# Patient Record
Sex: Female | Born: 1935 | Race: White | Hispanic: No | Smoking: Former smoker
Health system: Southern US, Community
[De-identification: ages and names within clinical notes are randomized; demographics above are authoritative.]

## PROBLEM LIST (undated history)

## (undated) DIAGNOSIS — J45909 Unspecified asthma, uncomplicated: Secondary | ICD-10-CM

## (undated) DIAGNOSIS — N390 Urinary tract infection, site not specified: Secondary | ICD-10-CM

## (undated) DIAGNOSIS — I5032 Chronic diastolic (congestive) heart failure: Secondary | ICD-10-CM

## (undated) DIAGNOSIS — Z8719 Personal history of other diseases of the digestive system: Secondary | ICD-10-CM

## (undated) DIAGNOSIS — I639 Cerebral infarction, unspecified: Secondary | ICD-10-CM

## (undated) DIAGNOSIS — M1990 Unspecified osteoarthritis, unspecified site: Secondary | ICD-10-CM

## (undated) DIAGNOSIS — J189 Pneumonia, unspecified organism: Secondary | ICD-10-CM

## (undated) DIAGNOSIS — N183 Chronic kidney disease, stage 3 unspecified: Secondary | ICD-10-CM

## (undated) DIAGNOSIS — I82409 Acute embolism and thrombosis of unspecified deep veins of unspecified lower extremity: Secondary | ICD-10-CM

## (undated) DIAGNOSIS — I4891 Unspecified atrial fibrillation: Secondary | ICD-10-CM

## (undated) DIAGNOSIS — J449 Chronic obstructive pulmonary disease, unspecified: Secondary | ICD-10-CM

## (undated) DIAGNOSIS — K439 Ventral hernia without obstruction or gangrene: Secondary | ICD-10-CM

## (undated) DIAGNOSIS — D649 Anemia, unspecified: Secondary | ICD-10-CM

## (undated) DIAGNOSIS — E119 Type 2 diabetes mellitus without complications: Secondary | ICD-10-CM

## (undated) DIAGNOSIS — R0602 Shortness of breath: Secondary | ICD-10-CM

## (undated) DIAGNOSIS — I350 Nonrheumatic aortic (valve) stenosis: Secondary | ICD-10-CM

## (undated) DIAGNOSIS — K56609 Unspecified intestinal obstruction, unspecified as to partial versus complete obstruction: Secondary | ICD-10-CM

## (undated) DIAGNOSIS — J9610 Chronic respiratory failure, unspecified whether with hypoxia or hypercapnia: Secondary | ICD-10-CM

## (undated) DIAGNOSIS — K219 Gastro-esophageal reflux disease without esophagitis: Secondary | ICD-10-CM

## (undated) DIAGNOSIS — R791 Abnormal coagulation profile: Secondary | ICD-10-CM

## (undated) DIAGNOSIS — I2510 Atherosclerotic heart disease of native coronary artery without angina pectoris: Secondary | ICD-10-CM

## (undated) DIAGNOSIS — Z9981 Dependence on supplemental oxygen: Secondary | ICD-10-CM

## (undated) DIAGNOSIS — I2699 Other pulmonary embolism without acute cor pulmonale: Secondary | ICD-10-CM

## (undated) DIAGNOSIS — H3530 Unspecified macular degeneration: Secondary | ICD-10-CM

## (undated) DIAGNOSIS — I10 Essential (primary) hypertension: Secondary | ICD-10-CM

## (undated) DIAGNOSIS — I219 Acute myocardial infarction, unspecified: Secondary | ICD-10-CM

## (undated) DIAGNOSIS — F329 Major depressive disorder, single episode, unspecified: Secondary | ICD-10-CM

## (undated) DIAGNOSIS — E890 Postprocedural hypothyroidism: Secondary | ICD-10-CM

## (undated) DIAGNOSIS — R569 Unspecified convulsions: Secondary | ICD-10-CM

## (undated) DIAGNOSIS — F32A Depression, unspecified: Secondary | ICD-10-CM

## (undated) DIAGNOSIS — Z9289 Personal history of other medical treatment: Secondary | ICD-10-CM

## (undated) DIAGNOSIS — E785 Hyperlipidemia, unspecified: Secondary | ICD-10-CM

## (undated) DIAGNOSIS — R079 Chest pain, unspecified: Secondary | ICD-10-CM

## (undated) DIAGNOSIS — Z8711 Personal history of peptic ulcer disease: Secondary | ICD-10-CM

## (undated) DIAGNOSIS — J42 Unspecified chronic bronchitis: Secondary | ICD-10-CM

## (undated) DIAGNOSIS — E0500 Thyrotoxicosis with diffuse goiter without thyrotoxic crisis or storm: Secondary | ICD-10-CM

## (undated) HISTORY — DX: Abnormal coagulation profile: R79.1

## (undated) HISTORY — DX: Chronic diastolic (congestive) heart failure: I50.32

## (undated) HISTORY — DX: Unspecified osteoarthritis, unspecified site: M19.90

## (undated) HISTORY — DX: Hyperlipidemia, unspecified: E78.5

## (undated) HISTORY — DX: Chronic respiratory failure, unspecified whether with hypoxia or hypercapnia: J96.10

## (undated) HISTORY — DX: Other pulmonary embolism without acute cor pulmonale: I26.99

## (undated) HISTORY — DX: Morbid (severe) obesity due to excess calories: E66.01

## (undated) HISTORY — PX: BREAST BIOPSY: SHX20

## (undated) HISTORY — DX: Gastro-esophageal reflux disease without esophagitis: K21.9

## (undated) HISTORY — PX: DILATION AND CURETTAGE OF UTERUS: SHX78

## (undated) HISTORY — DX: Depression, unspecified: F32.A

## (undated) HISTORY — DX: Unspecified atrial fibrillation: I48.91

## (undated) HISTORY — DX: Chronic kidney disease, stage 3 unspecified: N18.30

## (undated) HISTORY — DX: Major depressive disorder, single episode, unspecified: F32.9

## (undated) HISTORY — PX: APPENDECTOMY: SHX54

## (undated) HISTORY — DX: Acute embolism and thrombosis of unspecified deep veins of unspecified lower extremity: I82.409

## (undated) HISTORY — DX: Unspecified intestinal obstruction, unspecified as to partial versus complete obstruction: K56.609

## (undated) HISTORY — DX: Chronic kidney disease, stage 3 (moderate): N18.3

## (undated) HISTORY — DX: Ventral hernia without obstruction or gangrene: K43.9

## (undated) HISTORY — DX: Nonrheumatic aortic (valve) stenosis: I35.0

---

## 1968-10-26 HISTORY — PX: GASTRIC BYPASS: SHX52

## 1968-10-26 HISTORY — PX: CHOLECYSTECTOMY: SHX55

## 1978-10-27 DIAGNOSIS — I219 Acute myocardial infarction, unspecified: Secondary | ICD-10-CM

## 1978-10-27 HISTORY — DX: Acute myocardial infarction, unspecified: I21.9

## 1988-10-26 DIAGNOSIS — R569 Unspecified convulsions: Secondary | ICD-10-CM

## 1988-10-26 HISTORY — DX: Unspecified convulsions: R56.9

## 1995-02-26 DIAGNOSIS — I639 Cerebral infarction, unspecified: Secondary | ICD-10-CM

## 1995-02-26 HISTORY — DX: Cerebral infarction, unspecified: I63.9

## 1996-02-26 DIAGNOSIS — I2699 Other pulmonary embolism without acute cor pulmonale: Secondary | ICD-10-CM

## 1996-02-26 DIAGNOSIS — I82409 Acute embolism and thrombosis of unspecified deep veins of unspecified lower extremity: Secondary | ICD-10-CM

## 1996-02-26 HISTORY — PX: INSERTION OF VENA CAVA FILTER: SHX5871

## 1996-02-26 HISTORY — DX: Acute embolism and thrombosis of unspecified deep veins of unspecified lower extremity: I82.409

## 1996-02-26 HISTORY — DX: Other pulmonary embolism without acute cor pulmonale: I26.99

## 1997-05-02 ENCOUNTER — Ambulatory Visit (HOSPITAL_COMMUNITY): Admission: RE | Admit: 1997-05-02 | Discharge: 1997-05-02 | Payer: Self-pay | Admitting: Obstetrics

## 1997-05-05 ENCOUNTER — Ambulatory Visit (HOSPITAL_COMMUNITY): Admission: RE | Admit: 1997-05-05 | Discharge: 1997-05-05 | Payer: Self-pay | Admitting: Obstetrics

## 1997-06-16 ENCOUNTER — Encounter: Admission: RE | Admit: 1997-06-16 | Discharge: 1997-06-16 | Payer: Self-pay | Admitting: Obstetrics

## 1997-07-12 ENCOUNTER — Ambulatory Visit (HOSPITAL_COMMUNITY): Admission: RE | Admit: 1997-07-12 | Discharge: 1997-07-12 | Payer: Self-pay | Admitting: Cardiology

## 1997-07-28 ENCOUNTER — Ambulatory Visit (HOSPITAL_COMMUNITY): Admission: RE | Admit: 1997-07-28 | Discharge: 1997-07-28 | Payer: Self-pay | Admitting: Cardiology

## 1997-10-27 ENCOUNTER — Encounter: Payer: Self-pay | Admitting: Internal Medicine

## 1997-12-19 ENCOUNTER — Ambulatory Visit (HOSPITAL_COMMUNITY): Admission: RE | Admit: 1997-12-19 | Discharge: 1997-12-19 | Payer: Self-pay | Admitting: Family Medicine

## 1998-01-26 ENCOUNTER — Encounter: Admission: RE | Admit: 1998-01-26 | Discharge: 1998-02-20 | Payer: Self-pay | Admitting: Family Medicine

## 1998-04-20 ENCOUNTER — Encounter: Admission: RE | Admit: 1998-04-20 | Discharge: 1998-04-20 | Payer: Self-pay | Admitting: Obstetrics

## 1998-09-13 ENCOUNTER — Ambulatory Visit (HOSPITAL_COMMUNITY): Admission: RE | Admit: 1998-09-13 | Discharge: 1998-09-13 | Payer: Self-pay | Admitting: Family Medicine

## 1998-09-26 ENCOUNTER — Ambulatory Visit (HOSPITAL_COMMUNITY): Admission: RE | Admit: 1998-09-26 | Discharge: 1998-09-26 | Payer: Self-pay | Admitting: Cardiology

## 1998-09-27 ENCOUNTER — Encounter: Payer: Self-pay | Admitting: Cardiology

## 1998-11-22 ENCOUNTER — Ambulatory Visit (HOSPITAL_COMMUNITY): Admission: RE | Admit: 1998-11-22 | Discharge: 1998-11-22 | Payer: Self-pay | Admitting: Family Medicine

## 1998-11-22 ENCOUNTER — Encounter: Payer: Self-pay | Admitting: Family Medicine

## 1998-12-18 ENCOUNTER — Encounter: Payer: Self-pay | Admitting: Family Medicine

## 1998-12-18 ENCOUNTER — Ambulatory Visit (HOSPITAL_COMMUNITY): Admission: RE | Admit: 1998-12-18 | Discharge: 1998-12-18 | Payer: Self-pay | Admitting: Family Medicine

## 1999-01-03 ENCOUNTER — Encounter (HOSPITAL_BASED_OUTPATIENT_CLINIC_OR_DEPARTMENT_OTHER): Payer: Self-pay | Admitting: General Surgery

## 1999-01-03 ENCOUNTER — Ambulatory Visit (HOSPITAL_COMMUNITY): Admission: RE | Admit: 1999-01-03 | Discharge: 1999-01-03 | Payer: Self-pay | Admitting: *Deleted

## 1999-08-28 ENCOUNTER — Encounter: Admission: RE | Admit: 1999-08-28 | Discharge: 1999-08-28 | Payer: Self-pay | Admitting: Internal Medicine

## 1999-11-20 ENCOUNTER — Ambulatory Visit (HOSPITAL_COMMUNITY): Admission: RE | Admit: 1999-11-20 | Discharge: 1999-11-20 | Payer: Self-pay | Admitting: Internal Medicine

## 1999-11-20 ENCOUNTER — Encounter: Payer: Self-pay | Admitting: Internal Medicine

## 1999-11-20 ENCOUNTER — Encounter: Admission: RE | Admit: 1999-11-20 | Discharge: 1999-11-20 | Payer: Self-pay | Admitting: Internal Medicine

## 1999-11-22 ENCOUNTER — Ambulatory Visit (HOSPITAL_COMMUNITY): Admission: RE | Admit: 1999-11-22 | Discharge: 1999-11-22 | Payer: Self-pay | Admitting: Internal Medicine

## 1999-12-18 ENCOUNTER — Encounter: Admission: RE | Admit: 1999-12-18 | Discharge: 1999-12-18 | Payer: Self-pay | Admitting: Internal Medicine

## 2000-01-11 ENCOUNTER — Encounter: Payer: Self-pay | Admitting: Pulmonary Disease

## 2000-01-11 ENCOUNTER — Ambulatory Visit (HOSPITAL_COMMUNITY): Admission: RE | Admit: 2000-01-11 | Discharge: 2000-01-11 | Payer: Self-pay | Admitting: Pulmonary Disease

## 2000-02-06 ENCOUNTER — Ambulatory Visit (HOSPITAL_COMMUNITY): Admission: RE | Admit: 2000-02-06 | Discharge: 2000-02-06 | Payer: Self-pay | Admitting: Internal Medicine

## 2000-02-12 ENCOUNTER — Encounter: Admission: RE | Admit: 2000-02-12 | Discharge: 2000-02-12 | Payer: Self-pay | Admitting: Internal Medicine

## 2000-04-15 ENCOUNTER — Ambulatory Visit (HOSPITAL_COMMUNITY): Admission: RE | Admit: 2000-04-15 | Discharge: 2000-04-15 | Payer: Self-pay

## 2000-04-15 ENCOUNTER — Encounter: Admission: RE | Admit: 2000-04-15 | Discharge: 2000-04-15 | Payer: Self-pay | Admitting: Internal Medicine

## 2000-06-03 ENCOUNTER — Encounter: Admission: RE | Admit: 2000-06-03 | Discharge: 2000-06-03 | Payer: Self-pay | Admitting: Internal Medicine

## 2000-06-13 ENCOUNTER — Encounter: Payer: Self-pay | Admitting: Emergency Medicine

## 2000-06-13 ENCOUNTER — Inpatient Hospital Stay (HOSPITAL_COMMUNITY): Admission: EM | Admit: 2000-06-13 | Discharge: 2000-06-17 | Payer: Self-pay | Admitting: Emergency Medicine

## 2000-07-15 ENCOUNTER — Encounter: Admission: RE | Admit: 2000-07-15 | Discharge: 2000-07-15 | Payer: Self-pay | Admitting: Internal Medicine

## 2000-08-12 ENCOUNTER — Encounter: Admission: RE | Admit: 2000-08-12 | Discharge: 2000-08-12 | Payer: Self-pay | Admitting: Internal Medicine

## 2000-09-02 ENCOUNTER — Ambulatory Visit (HOSPITAL_COMMUNITY): Admission: RE | Admit: 2000-09-02 | Discharge: 2000-09-02 | Payer: Self-pay | Admitting: *Deleted

## 2000-09-23 ENCOUNTER — Encounter: Admission: RE | Admit: 2000-09-23 | Discharge: 2000-09-23 | Payer: Self-pay | Admitting: Internal Medicine

## 2000-11-28 ENCOUNTER — Encounter: Admission: RE | Admit: 2000-11-28 | Discharge: 2001-02-26 | Payer: Self-pay | Admitting: Internal Medicine

## 2000-12-22 ENCOUNTER — Encounter: Admission: RE | Admit: 2000-12-22 | Discharge: 2000-12-22 | Payer: Self-pay | Admitting: Internal Medicine

## 2000-12-23 ENCOUNTER — Encounter: Admission: RE | Admit: 2000-12-23 | Discharge: 2000-12-23 | Payer: Self-pay | Admitting: Internal Medicine

## 2001-01-08 ENCOUNTER — Ambulatory Visit (HOSPITAL_COMMUNITY): Admission: RE | Admit: 2001-01-08 | Discharge: 2001-01-08 | Payer: Self-pay | Admitting: Internal Medicine

## 2001-01-08 ENCOUNTER — Encounter: Admission: RE | Admit: 2001-01-08 | Discharge: 2001-01-08 | Payer: Self-pay | Admitting: Internal Medicine

## 2001-01-12 ENCOUNTER — Encounter: Admission: RE | Admit: 2001-01-12 | Discharge: 2001-01-12 | Payer: Self-pay | Admitting: Internal Medicine

## 2001-01-26 ENCOUNTER — Encounter: Payer: Self-pay | Admitting: Internal Medicine

## 2001-01-26 ENCOUNTER — Encounter: Admission: RE | Admit: 2001-01-26 | Discharge: 2001-01-26 | Payer: Self-pay | Admitting: Internal Medicine

## 2001-01-26 ENCOUNTER — Ambulatory Visit (HOSPITAL_COMMUNITY): Admission: RE | Admit: 2001-01-26 | Discharge: 2001-01-26 | Payer: Self-pay | Admitting: Internal Medicine

## 2001-03-02 ENCOUNTER — Encounter: Admission: RE | Admit: 2001-03-02 | Discharge: 2001-03-02 | Payer: Self-pay | Admitting: Internal Medicine

## 2001-03-24 ENCOUNTER — Encounter: Admission: RE | Admit: 2001-03-24 | Discharge: 2001-03-24 | Payer: Self-pay | Admitting: Internal Medicine

## 2001-03-27 ENCOUNTER — Inpatient Hospital Stay (HOSPITAL_COMMUNITY): Admission: EM | Admit: 2001-03-27 | Discharge: 2001-03-31 | Payer: Self-pay | Admitting: Emergency Medicine

## 2001-03-28 ENCOUNTER — Encounter: Payer: Self-pay | Admitting: Internal Medicine

## 2001-04-08 ENCOUNTER — Ambulatory Visit (HOSPITAL_COMMUNITY): Admission: RE | Admit: 2001-04-08 | Discharge: 2001-04-08 | Payer: Self-pay

## 2001-04-08 ENCOUNTER — Encounter: Admission: RE | Admit: 2001-04-08 | Discharge: 2001-04-08 | Payer: Self-pay

## 2001-04-09 ENCOUNTER — Inpatient Hospital Stay (HOSPITAL_COMMUNITY): Admission: AD | Admit: 2001-04-09 | Discharge: 2001-04-17 | Payer: Self-pay | Admitting: Internal Medicine

## 2001-04-10 ENCOUNTER — Encounter: Payer: Self-pay | Admitting: Internal Medicine

## 2001-04-11 ENCOUNTER — Encounter: Payer: Self-pay | Admitting: *Deleted

## 2001-04-13 ENCOUNTER — Encounter: Payer: Self-pay | Admitting: Internal Medicine

## 2001-04-27 ENCOUNTER — Encounter: Admission: RE | Admit: 2001-04-27 | Discharge: 2001-04-27 | Payer: Self-pay | Admitting: Internal Medicine

## 2001-05-27 ENCOUNTER — Encounter (INDEPENDENT_AMBULATORY_CARE_PROVIDER_SITE_OTHER): Payer: Self-pay | Admitting: *Deleted

## 2001-05-27 ENCOUNTER — Ambulatory Visit (HOSPITAL_COMMUNITY): Admission: RE | Admit: 2001-05-27 | Discharge: 2001-05-27 | Payer: Self-pay | Admitting: *Deleted

## 2001-05-27 LAB — HM COLONOSCOPY

## 2001-06-29 ENCOUNTER — Ambulatory Visit (HOSPITAL_COMMUNITY): Admission: RE | Admit: 2001-06-29 | Discharge: 2001-06-29 | Payer: Self-pay | Admitting: Cardiology

## 2001-06-29 ENCOUNTER — Encounter: Payer: Self-pay | Admitting: Cardiology

## 2001-06-30 ENCOUNTER — Encounter: Admission: RE | Admit: 2001-06-30 | Discharge: 2001-06-30 | Payer: Self-pay | Admitting: Internal Medicine

## 2001-07-01 ENCOUNTER — Encounter: Admission: RE | Admit: 2001-07-01 | Discharge: 2001-07-01 | Payer: Self-pay | Admitting: Internal Medicine

## 2001-07-08 ENCOUNTER — Encounter: Admission: RE | Admit: 2001-07-08 | Discharge: 2001-07-08 | Payer: Self-pay | Admitting: Internal Medicine

## 2001-07-09 ENCOUNTER — Encounter: Admission: RE | Admit: 2001-07-09 | Discharge: 2001-10-07 | Payer: Self-pay | Admitting: Internal Medicine

## 2001-07-16 ENCOUNTER — Emergency Department (HOSPITAL_COMMUNITY): Admission: EM | Admit: 2001-07-16 | Discharge: 2001-07-16 | Payer: Self-pay | Admitting: Emergency Medicine

## 2001-08-05 ENCOUNTER — Encounter: Admission: RE | Admit: 2001-08-05 | Discharge: 2001-08-05 | Payer: Self-pay | Admitting: Internal Medicine

## 2001-08-17 ENCOUNTER — Encounter: Admission: RE | Admit: 2001-08-17 | Discharge: 2001-08-17 | Payer: Self-pay | Admitting: Internal Medicine

## 2001-08-24 ENCOUNTER — Encounter: Payer: Self-pay | Admitting: Internal Medicine

## 2001-08-24 ENCOUNTER — Ambulatory Visit (HOSPITAL_COMMUNITY): Admission: RE | Admit: 2001-08-24 | Discharge: 2001-08-24 | Payer: Self-pay | Admitting: Internal Medicine

## 2001-10-27 ENCOUNTER — Encounter (HOSPITAL_BASED_OUTPATIENT_CLINIC_OR_DEPARTMENT_OTHER): Admission: RE | Admit: 2001-10-27 | Discharge: 2002-01-19 | Payer: Self-pay | Admitting: Internal Medicine

## 2001-11-16 ENCOUNTER — Encounter: Admission: RE | Admit: 2001-11-16 | Discharge: 2001-11-16 | Payer: Self-pay | Admitting: Internal Medicine

## 2001-11-17 ENCOUNTER — Encounter: Admission: RE | Admit: 2001-11-17 | Discharge: 2001-11-17 | Payer: Self-pay | Admitting: Internal Medicine

## 2001-12-02 ENCOUNTER — Encounter (HOSPITAL_BASED_OUTPATIENT_CLINIC_OR_DEPARTMENT_OTHER): Admission: RE | Admit: 2001-12-02 | Discharge: 2002-03-02 | Payer: Self-pay | Admitting: Internal Medicine

## 2002-01-28 ENCOUNTER — Inpatient Hospital Stay (HOSPITAL_COMMUNITY): Admission: EM | Admit: 2002-01-28 | Discharge: 2002-02-02 | Payer: Self-pay | Admitting: *Deleted

## 2002-01-28 ENCOUNTER — Encounter: Payer: Self-pay | Admitting: Cardiology

## 2002-01-29 ENCOUNTER — Encounter (INDEPENDENT_AMBULATORY_CARE_PROVIDER_SITE_OTHER): Payer: Self-pay | Admitting: Cardiology

## 2002-02-09 ENCOUNTER — Encounter: Admission: RE | Admit: 2002-02-09 | Discharge: 2002-02-09 | Payer: Self-pay | Admitting: Internal Medicine

## 2002-02-12 ENCOUNTER — Encounter: Admission: RE | Admit: 2002-02-12 | Discharge: 2002-02-12 | Payer: Self-pay | Admitting: Internal Medicine

## 2002-02-15 ENCOUNTER — Encounter: Admission: RE | Admit: 2002-02-15 | Discharge: 2002-02-15 | Payer: Self-pay | Admitting: Internal Medicine

## 2002-02-24 ENCOUNTER — Ambulatory Visit (HOSPITAL_COMMUNITY): Admission: RE | Admit: 2002-02-24 | Discharge: 2002-02-24 | Payer: Self-pay | Admitting: Cardiology

## 2002-03-12 ENCOUNTER — Encounter (HOSPITAL_BASED_OUTPATIENT_CLINIC_OR_DEPARTMENT_OTHER): Admission: RE | Admit: 2002-03-12 | Discharge: 2002-06-10 | Payer: Self-pay | Admitting: Internal Medicine

## 2002-03-29 ENCOUNTER — Encounter: Admission: RE | Admit: 2002-03-29 | Discharge: 2002-03-29 | Payer: Self-pay | Admitting: Internal Medicine

## 2002-04-01 ENCOUNTER — Encounter: Admission: RE | Admit: 2002-04-01 | Discharge: 2002-04-01 | Payer: Self-pay | Admitting: Internal Medicine

## 2002-04-19 ENCOUNTER — Encounter: Admission: RE | Admit: 2002-04-19 | Discharge: 2002-04-19 | Payer: Self-pay | Admitting: Internal Medicine

## 2002-05-24 ENCOUNTER — Encounter: Admission: RE | Admit: 2002-05-24 | Discharge: 2002-05-24 | Payer: Self-pay | Admitting: Internal Medicine

## 2002-06-02 ENCOUNTER — Encounter: Admission: RE | Admit: 2002-06-02 | Discharge: 2002-06-02 | Payer: Self-pay | Admitting: Internal Medicine

## 2002-06-15 ENCOUNTER — Encounter (HOSPITAL_BASED_OUTPATIENT_CLINIC_OR_DEPARTMENT_OTHER): Admission: RE | Admit: 2002-06-15 | Discharge: 2002-09-13 | Payer: Self-pay | Admitting: Internal Medicine

## 2002-06-28 ENCOUNTER — Encounter: Admission: RE | Admit: 2002-06-28 | Discharge: 2002-06-28 | Payer: Self-pay | Admitting: Internal Medicine

## 2002-07-05 ENCOUNTER — Encounter: Admission: RE | Admit: 2002-07-05 | Discharge: 2002-07-05 | Payer: Self-pay | Admitting: Internal Medicine

## 2002-08-02 ENCOUNTER — Encounter: Admission: RE | Admit: 2002-08-02 | Discharge: 2002-08-02 | Payer: Self-pay | Admitting: Internal Medicine

## 2002-08-23 ENCOUNTER — Encounter: Admission: RE | Admit: 2002-08-23 | Discharge: 2002-08-23 | Payer: Self-pay | Admitting: Internal Medicine

## 2002-09-15 ENCOUNTER — Encounter (HOSPITAL_BASED_OUTPATIENT_CLINIC_OR_DEPARTMENT_OTHER): Admission: RE | Admit: 2002-09-15 | Discharge: 2002-09-22 | Payer: Self-pay | Admitting: Internal Medicine

## 2002-09-20 ENCOUNTER — Ambulatory Visit (HOSPITAL_COMMUNITY): Admission: RE | Admit: 2002-09-20 | Discharge: 2002-09-20 | Payer: Self-pay | Admitting: Internal Medicine

## 2002-09-20 ENCOUNTER — Encounter: Admission: RE | Admit: 2002-09-20 | Discharge: 2002-09-20 | Payer: Self-pay | Admitting: Internal Medicine

## 2002-10-11 ENCOUNTER — Encounter: Admission: RE | Admit: 2002-10-11 | Discharge: 2002-10-11 | Payer: Self-pay | Admitting: Internal Medicine

## 2002-11-08 ENCOUNTER — Encounter: Admission: RE | Admit: 2002-11-08 | Discharge: 2002-11-08 | Payer: Self-pay | Admitting: Infectious Diseases

## 2002-11-17 ENCOUNTER — Encounter: Admission: RE | Admit: 2002-11-17 | Discharge: 2002-11-17 | Payer: Self-pay | Admitting: Internal Medicine

## 2002-12-06 ENCOUNTER — Encounter: Admission: RE | Admit: 2002-12-06 | Discharge: 2002-12-06 | Payer: Self-pay | Admitting: Internal Medicine

## 2002-12-15 ENCOUNTER — Encounter: Admission: RE | Admit: 2002-12-15 | Discharge: 2002-12-15 | Payer: Self-pay | Admitting: Internal Medicine

## 2002-12-27 ENCOUNTER — Encounter: Admission: RE | Admit: 2002-12-27 | Discharge: 2002-12-27 | Payer: Self-pay | Admitting: Internal Medicine

## 2002-12-30 ENCOUNTER — Encounter (HOSPITAL_BASED_OUTPATIENT_CLINIC_OR_DEPARTMENT_OTHER): Admission: RE | Admit: 2002-12-30 | Discharge: 2003-02-09 | Payer: Self-pay | Admitting: Internal Medicine

## 2003-01-13 ENCOUNTER — Ambulatory Visit (HOSPITAL_COMMUNITY): Admission: RE | Admit: 2003-01-13 | Discharge: 2003-01-13 | Payer: Self-pay | Admitting: Internal Medicine

## 2003-01-13 ENCOUNTER — Encounter: Admission: RE | Admit: 2003-01-13 | Discharge: 2003-01-13 | Payer: Self-pay | Admitting: Internal Medicine

## 2003-01-24 ENCOUNTER — Encounter: Admission: RE | Admit: 2003-01-24 | Discharge: 2003-01-24 | Payer: Self-pay | Admitting: Internal Medicine

## 2003-02-10 ENCOUNTER — Ambulatory Visit (HOSPITAL_COMMUNITY): Admission: RE | Admit: 2003-02-10 | Discharge: 2003-02-10 | Payer: Self-pay | Admitting: Internal Medicine

## 2003-02-10 ENCOUNTER — Encounter: Admission: RE | Admit: 2003-02-10 | Discharge: 2003-02-10 | Payer: Self-pay | Admitting: Internal Medicine

## 2003-03-03 ENCOUNTER — Encounter: Admission: RE | Admit: 2003-03-03 | Discharge: 2003-03-03 | Payer: Self-pay | Admitting: Internal Medicine

## 2003-03-03 ENCOUNTER — Inpatient Hospital Stay (HOSPITAL_COMMUNITY): Admission: AD | Admit: 2003-03-03 | Discharge: 2003-03-09 | Payer: Self-pay | Admitting: Internal Medicine

## 2003-03-24 ENCOUNTER — Encounter: Admission: RE | Admit: 2003-03-24 | Discharge: 2003-03-24 | Payer: Self-pay | Admitting: Internal Medicine

## 2003-04-11 ENCOUNTER — Encounter: Admission: RE | Admit: 2003-04-11 | Discharge: 2003-04-11 | Payer: Self-pay | Admitting: Internal Medicine

## 2003-05-05 ENCOUNTER — Encounter (HOSPITAL_BASED_OUTPATIENT_CLINIC_OR_DEPARTMENT_OTHER): Admission: RE | Admit: 2003-05-05 | Discharge: 2003-05-13 | Payer: Self-pay | Admitting: Internal Medicine

## 2003-05-05 ENCOUNTER — Encounter: Admission: RE | Admit: 2003-05-05 | Discharge: 2003-05-05 | Payer: Self-pay | Admitting: Internal Medicine

## 2003-05-27 ENCOUNTER — Encounter: Admission: RE | Admit: 2003-05-27 | Discharge: 2003-05-27 | Payer: Self-pay | Admitting: Internal Medicine

## 2003-05-30 ENCOUNTER — Encounter: Admission: RE | Admit: 2003-05-30 | Discharge: 2003-05-30 | Payer: Self-pay | Admitting: Internal Medicine

## 2003-06-27 ENCOUNTER — Encounter: Admission: RE | Admit: 2003-06-27 | Discharge: 2003-06-27 | Payer: Self-pay | Admitting: Internal Medicine

## 2003-07-25 ENCOUNTER — Encounter: Admission: RE | Admit: 2003-07-25 | Discharge: 2003-07-25 | Payer: Self-pay | Admitting: Internal Medicine

## 2003-08-08 ENCOUNTER — Encounter: Admission: RE | Admit: 2003-08-08 | Discharge: 2003-08-08 | Payer: Self-pay | Admitting: Internal Medicine

## 2003-08-09 ENCOUNTER — Encounter (HOSPITAL_BASED_OUTPATIENT_CLINIC_OR_DEPARTMENT_OTHER): Admission: RE | Admit: 2003-08-09 | Discharge: 2003-09-30 | Payer: Self-pay | Admitting: Internal Medicine

## 2003-09-08 ENCOUNTER — Encounter: Admission: RE | Admit: 2003-09-08 | Discharge: 2003-09-08 | Payer: Self-pay | Admitting: Internal Medicine

## 2003-09-26 ENCOUNTER — Encounter: Admission: RE | Admit: 2003-09-26 | Discharge: 2003-09-26 | Payer: Self-pay | Admitting: Internal Medicine

## 2003-10-24 ENCOUNTER — Encounter: Admission: RE | Admit: 2003-10-24 | Discharge: 2003-10-24 | Payer: Self-pay | Admitting: Internal Medicine

## 2003-11-21 ENCOUNTER — Ambulatory Visit: Payer: Self-pay | Admitting: Internal Medicine

## 2003-12-15 ENCOUNTER — Ambulatory Visit: Payer: Self-pay | Admitting: Internal Medicine

## 2004-01-30 ENCOUNTER — Ambulatory Visit: Payer: Self-pay | Admitting: Internal Medicine

## 2004-02-26 HISTORY — PX: CARDIAC CATHETERIZATION: SHX172

## 2004-03-02 ENCOUNTER — Ambulatory Visit: Payer: Self-pay | Admitting: Internal Medicine

## 2004-04-02 ENCOUNTER — Ambulatory Visit: Payer: Self-pay | Admitting: Internal Medicine

## 2004-04-13 ENCOUNTER — Ambulatory Visit: Payer: Self-pay | Admitting: Internal Medicine

## 2004-05-03 ENCOUNTER — Ambulatory Visit: Payer: Self-pay | Admitting: Internal Medicine

## 2004-06-07 ENCOUNTER — Ambulatory Visit: Payer: Self-pay | Admitting: Internal Medicine

## 2004-07-02 ENCOUNTER — Ambulatory Visit: Payer: Self-pay | Admitting: Internal Medicine

## 2004-07-11 ENCOUNTER — Encounter (HOSPITAL_BASED_OUTPATIENT_CLINIC_OR_DEPARTMENT_OTHER): Admission: RE | Admit: 2004-07-11 | Discharge: 2004-10-02 | Payer: Self-pay | Admitting: Surgery

## 2004-07-18 ENCOUNTER — Ambulatory Visit: Payer: Self-pay | Admitting: Internal Medicine

## 2004-08-01 ENCOUNTER — Ambulatory Visit: Payer: Self-pay | Admitting: Internal Medicine

## 2004-09-03 ENCOUNTER — Ambulatory Visit: Payer: Self-pay | Admitting: Internal Medicine

## 2004-09-27 ENCOUNTER — Emergency Department (HOSPITAL_COMMUNITY): Admission: EM | Admit: 2004-09-27 | Discharge: 2004-09-27 | Payer: Self-pay | Admitting: Emergency Medicine

## 2004-10-01 ENCOUNTER — Ambulatory Visit: Payer: Self-pay | Admitting: Internal Medicine

## 2004-10-04 ENCOUNTER — Ambulatory Visit: Payer: Self-pay | Admitting: Internal Medicine

## 2004-10-10 ENCOUNTER — Ambulatory Visit: Payer: Self-pay | Admitting: Internal Medicine

## 2004-11-02 ENCOUNTER — Ambulatory Visit: Payer: Self-pay | Admitting: Internal Medicine

## 2004-12-10 ENCOUNTER — Ambulatory Visit: Payer: Self-pay | Admitting: Internal Medicine

## 2004-12-24 ENCOUNTER — Inpatient Hospital Stay (HOSPITAL_COMMUNITY): Admission: AD | Admit: 2004-12-24 | Discharge: 2004-12-31 | Payer: Self-pay | Admitting: Internal Medicine

## 2004-12-24 ENCOUNTER — Ambulatory Visit: Payer: Self-pay | Admitting: Internal Medicine

## 2004-12-25 ENCOUNTER — Encounter (INDEPENDENT_AMBULATORY_CARE_PROVIDER_SITE_OTHER): Payer: Self-pay | Admitting: Cardiology

## 2005-01-03 ENCOUNTER — Ambulatory Visit: Payer: Self-pay | Admitting: Internal Medicine

## 2005-01-14 ENCOUNTER — Ambulatory Visit: Payer: Self-pay | Admitting: Internal Medicine

## 2005-02-08 ENCOUNTER — Ambulatory Visit: Payer: Self-pay | Admitting: Internal Medicine

## 2005-03-04 ENCOUNTER — Ambulatory Visit: Payer: Self-pay | Admitting: Internal Medicine

## 2005-03-11 ENCOUNTER — Ambulatory Visit: Payer: Self-pay | Admitting: Hospitalist

## 2005-04-26 ENCOUNTER — Ambulatory Visit: Payer: Self-pay | Admitting: Internal Medicine

## 2005-05-16 ENCOUNTER — Ambulatory Visit: Payer: Self-pay | Admitting: Cardiology

## 2005-05-22 ENCOUNTER — Ambulatory Visit: Payer: Self-pay | Admitting: Infectious Diseases

## 2005-05-23 ENCOUNTER — Inpatient Hospital Stay (HOSPITAL_COMMUNITY): Admission: EM | Admit: 2005-05-23 | Discharge: 2005-06-04 | Payer: Self-pay | Admitting: Emergency Medicine

## 2005-06-13 ENCOUNTER — Ambulatory Visit: Payer: Self-pay | Admitting: Internal Medicine

## 2005-06-18 ENCOUNTER — Ambulatory Visit: Payer: Self-pay | Admitting: Internal Medicine

## 2005-06-27 ENCOUNTER — Ambulatory Visit: Payer: Self-pay | Admitting: Internal Medicine

## 2005-07-18 ENCOUNTER — Ambulatory Visit: Payer: Self-pay

## 2005-07-25 ENCOUNTER — Ambulatory Visit: Payer: Self-pay | Admitting: Internal Medicine

## 2005-08-27 ENCOUNTER — Ambulatory Visit: Payer: Self-pay | Admitting: Internal Medicine

## 2005-09-26 ENCOUNTER — Ambulatory Visit: Payer: Self-pay | Admitting: Hospitalist

## 2005-10-08 ENCOUNTER — Ambulatory Visit: Payer: Self-pay | Admitting: Internal Medicine

## 2005-10-17 ENCOUNTER — Ambulatory Visit: Payer: Self-pay | Admitting: Hospitalist

## 2005-11-06 ENCOUNTER — Ambulatory Visit: Payer: Self-pay | Admitting: Internal Medicine

## 2005-11-19 ENCOUNTER — Ambulatory Visit: Payer: Self-pay | Admitting: Hospitalist

## 2005-11-26 ENCOUNTER — Ambulatory Visit: Payer: Self-pay | Admitting: Internal Medicine

## 2005-12-30 ENCOUNTER — Ambulatory Visit: Payer: Self-pay | Admitting: Internal Medicine

## 2006-02-06 ENCOUNTER — Ambulatory Visit: Payer: Self-pay | Admitting: Internal Medicine

## 2006-02-19 ENCOUNTER — Inpatient Hospital Stay (HOSPITAL_COMMUNITY): Admission: EM | Admit: 2006-02-19 | Discharge: 2006-02-28 | Payer: Self-pay | Admitting: Emergency Medicine

## 2006-02-19 ENCOUNTER — Ambulatory Visit: Payer: Self-pay | Admitting: Internal Medicine

## 2006-02-19 ENCOUNTER — Ambulatory Visit: Payer: Self-pay | Admitting: Cardiology

## 2006-02-26 ENCOUNTER — Encounter: Payer: Self-pay | Admitting: Cardiology

## 2006-03-03 ENCOUNTER — Ambulatory Visit: Payer: Self-pay | Admitting: Internal Medicine

## 2006-03-03 ENCOUNTER — Encounter (INDEPENDENT_AMBULATORY_CARE_PROVIDER_SITE_OTHER): Payer: Self-pay | Admitting: Dermatology

## 2006-03-03 LAB — CONVERTED CEMR LAB
BUN: 15 mg/dL (ref 6–23)
CO2: 29 meq/L (ref 19–32)
Glucose, Bld: 283 mg/dL — ABNORMAL HIGH (ref 70–99)
Potassium: 4.4 meq/L (ref 3.5–5.3)
Sodium: 132 meq/L — ABNORMAL LOW (ref 135–145)

## 2006-03-20 DIAGNOSIS — I10 Essential (primary) hypertension: Secondary | ICD-10-CM | POA: Insufficient documentation

## 2006-03-20 DIAGNOSIS — E039 Hypothyroidism, unspecified: Secondary | ICD-10-CM | POA: Insufficient documentation

## 2006-03-20 DIAGNOSIS — Z86718 Personal history of other venous thrombosis and embolism: Secondary | ICD-10-CM | POA: Insufficient documentation

## 2006-03-20 DIAGNOSIS — J4489 Other specified chronic obstructive pulmonary disease: Secondary | ICD-10-CM | POA: Insufficient documentation

## 2006-03-20 DIAGNOSIS — I5023 Acute on chronic systolic (congestive) heart failure: Secondary | ICD-10-CM

## 2006-03-20 DIAGNOSIS — Z8679 Personal history of other diseases of the circulatory system: Secondary | ICD-10-CM | POA: Insufficient documentation

## 2006-03-20 DIAGNOSIS — M1990 Unspecified osteoarthritis, unspecified site: Secondary | ICD-10-CM | POA: Insufficient documentation

## 2006-03-20 DIAGNOSIS — M109 Gout, unspecified: Secondary | ICD-10-CM

## 2006-03-20 DIAGNOSIS — F329 Major depressive disorder, single episode, unspecified: Secondary | ICD-10-CM

## 2006-03-20 DIAGNOSIS — J449 Chronic obstructive pulmonary disease, unspecified: Secondary | ICD-10-CM

## 2006-03-20 DIAGNOSIS — I4891 Unspecified atrial fibrillation: Secondary | ICD-10-CM | POA: Insufficient documentation

## 2006-03-24 ENCOUNTER — Inpatient Hospital Stay (HOSPITAL_COMMUNITY): Admission: AD | Admit: 2006-03-24 | Discharge: 2006-03-27 | Payer: Self-pay | Admitting: *Deleted

## 2006-03-24 ENCOUNTER — Encounter: Admission: RE | Admit: 2006-03-24 | Discharge: 2006-03-24 | Payer: Self-pay | Admitting: Internal Medicine

## 2006-03-24 ENCOUNTER — Ambulatory Visit: Payer: Self-pay | Admitting: *Deleted

## 2006-03-24 ENCOUNTER — Ambulatory Visit: Payer: Self-pay | Admitting: Internal Medicine

## 2006-03-31 ENCOUNTER — Ambulatory Visit: Payer: Self-pay | Admitting: Internal Medicine

## 2006-04-08 ENCOUNTER — Encounter: Payer: Self-pay | Admitting: Internal Medicine

## 2006-04-09 ENCOUNTER — Ambulatory Visit: Payer: Self-pay | Admitting: Internal Medicine

## 2006-04-09 ENCOUNTER — Encounter (INDEPENDENT_AMBULATORY_CARE_PROVIDER_SITE_OTHER): Payer: Self-pay | Admitting: *Deleted

## 2006-04-09 LAB — CONVERTED CEMR LAB
BUN: 14 mg/dL (ref 6–23)
CO2: 27 meq/L (ref 19–32)
Glucose, Bld: 112 mg/dL — ABNORMAL HIGH (ref 70–99)
Potassium: 3.9 meq/L (ref 3.5–5.3)
Sodium: 140 meq/L (ref 135–145)

## 2006-04-21 ENCOUNTER — Ambulatory Visit: Payer: Self-pay | Admitting: Internal Medicine

## 2006-04-21 ENCOUNTER — Telehealth: Payer: Self-pay | Admitting: *Deleted

## 2006-05-15 ENCOUNTER — Ambulatory Visit: Payer: Self-pay | Admitting: Cardiology

## 2006-05-27 HISTORY — PX: HERNIA REPAIR: SHX51

## 2006-05-29 ENCOUNTER — Telehealth: Payer: Self-pay | Admitting: *Deleted

## 2006-05-29 ENCOUNTER — Ambulatory Visit: Payer: Self-pay | Admitting: Internal Medicine

## 2006-05-29 LAB — CONVERTED CEMR LAB: INR: 2.6

## 2006-06-12 ENCOUNTER — Ambulatory Visit: Payer: Self-pay | Admitting: Internal Medicine

## 2006-06-12 ENCOUNTER — Inpatient Hospital Stay (HOSPITAL_COMMUNITY): Admission: AD | Admit: 2006-06-12 | Discharge: 2006-06-25 | Payer: Self-pay | Admitting: Surgery

## 2006-06-25 ENCOUNTER — Inpatient Hospital Stay (HOSPITAL_COMMUNITY)
Admission: RE | Admit: 2006-06-25 | Discharge: 2006-07-04 | Payer: Self-pay | Admitting: Physical Medicine & Rehabilitation

## 2006-06-25 ENCOUNTER — Ambulatory Visit: Payer: Self-pay | Admitting: Physical Medicine & Rehabilitation

## 2006-07-07 ENCOUNTER — Telehealth (INDEPENDENT_AMBULATORY_CARE_PROVIDER_SITE_OTHER): Payer: Self-pay | Admitting: *Deleted

## 2006-07-10 ENCOUNTER — Ambulatory Visit: Payer: Self-pay | Admitting: Internal Medicine

## 2006-07-10 ENCOUNTER — Encounter (INDEPENDENT_AMBULATORY_CARE_PROVIDER_SITE_OTHER): Payer: Self-pay | Admitting: *Deleted

## 2006-07-10 LAB — CONVERTED CEMR LAB
BUN: 12 mg/dL (ref 6–23)
Blood Glucose, Fingerstick: 185
CO2: 33 meq/L — ABNORMAL HIGH (ref 19–32)
Chloride: 95 meq/L — ABNORMAL LOW (ref 96–112)
Glucose, Bld: 175 mg/dL — ABNORMAL HIGH (ref 70–99)
Hemoglobin: 10 g/dL — ABNORMAL LOW (ref 12.0–15.0)
Potassium: 4.7 meq/L (ref 3.5–5.3)
RBC: 3.5 M/uL — ABNORMAL LOW (ref 3.87–5.11)
WBC: 12.6 10*3/uL — ABNORMAL HIGH (ref 4.0–10.5)

## 2006-07-14 ENCOUNTER — Inpatient Hospital Stay (HOSPITAL_COMMUNITY): Admission: EM | Admit: 2006-07-14 | Discharge: 2006-07-22 | Payer: Self-pay | Admitting: Emergency Medicine

## 2006-07-14 ENCOUNTER — Ambulatory Visit: Payer: Self-pay | Admitting: Internal Medicine

## 2006-07-14 ENCOUNTER — Telehealth (INDEPENDENT_AMBULATORY_CARE_PROVIDER_SITE_OTHER): Payer: Self-pay | Admitting: *Deleted

## 2006-07-22 ENCOUNTER — Encounter (INDEPENDENT_AMBULATORY_CARE_PROVIDER_SITE_OTHER): Payer: Self-pay | Admitting: Internal Medicine

## 2006-08-15 ENCOUNTER — Encounter (INDEPENDENT_AMBULATORY_CARE_PROVIDER_SITE_OTHER): Payer: Self-pay | Admitting: Internal Medicine

## 2006-08-26 ENCOUNTER — Encounter: Payer: Self-pay | Admitting: Internal Medicine

## 2006-09-22 ENCOUNTER — Telehealth (INDEPENDENT_AMBULATORY_CARE_PROVIDER_SITE_OTHER): Payer: Self-pay | Admitting: Pharmacist

## 2006-09-29 ENCOUNTER — Encounter: Payer: Self-pay | Admitting: Internal Medicine

## 2006-09-30 ENCOUNTER — Telehealth (INDEPENDENT_AMBULATORY_CARE_PROVIDER_SITE_OTHER): Payer: Self-pay | Admitting: *Deleted

## 2006-09-30 ENCOUNTER — Telehealth: Payer: Self-pay | Admitting: Internal Medicine

## 2006-10-01 ENCOUNTER — Inpatient Hospital Stay (HOSPITAL_COMMUNITY): Admission: EM | Admit: 2006-10-01 | Discharge: 2006-10-07 | Payer: Self-pay | Admitting: Emergency Medicine

## 2006-10-01 ENCOUNTER — Ambulatory Visit: Payer: Self-pay | Admitting: Hospitalist

## 2006-10-03 ENCOUNTER — Encounter (INDEPENDENT_AMBULATORY_CARE_PROVIDER_SITE_OTHER): Payer: Self-pay | Admitting: Hospitalist

## 2006-10-03 ENCOUNTER — Ambulatory Visit: Payer: Self-pay | Admitting: Cardiology

## 2006-10-10 ENCOUNTER — Encounter: Payer: Self-pay | Admitting: Internal Medicine

## 2006-10-14 ENCOUNTER — Emergency Department (HOSPITAL_COMMUNITY): Admission: EM | Admit: 2006-10-14 | Discharge: 2006-10-14 | Payer: Self-pay | Admitting: Emergency Medicine

## 2006-10-22 ENCOUNTER — Ambulatory Visit: Payer: Self-pay | Admitting: Internal Medicine

## 2006-10-22 ENCOUNTER — Encounter: Payer: Self-pay | Admitting: Pharmacist

## 2006-10-22 LAB — CONVERTED CEMR LAB
Blood Glucose, Fingerstick: 171
INR: 5.8

## 2006-10-30 ENCOUNTER — Telehealth: Payer: Self-pay | Admitting: *Deleted

## 2006-10-30 ENCOUNTER — Telehealth (INDEPENDENT_AMBULATORY_CARE_PROVIDER_SITE_OTHER): Payer: Self-pay | Admitting: *Deleted

## 2006-11-06 ENCOUNTER — Telehealth (INDEPENDENT_AMBULATORY_CARE_PROVIDER_SITE_OTHER): Payer: Self-pay | Admitting: Pharmacy Technician

## 2006-11-07 ENCOUNTER — Telehealth (INDEPENDENT_AMBULATORY_CARE_PROVIDER_SITE_OTHER): Payer: Self-pay | Admitting: *Deleted

## 2006-11-10 ENCOUNTER — Ambulatory Visit: Payer: Self-pay | Admitting: Internal Medicine

## 2006-11-10 LAB — CONVERTED CEMR LAB: INR: 3.4

## 2006-11-13 ENCOUNTER — Telehealth (INDEPENDENT_AMBULATORY_CARE_PROVIDER_SITE_OTHER): Payer: Self-pay | Admitting: Pharmacy Technician

## 2006-12-08 ENCOUNTER — Ambulatory Visit: Payer: Self-pay | Admitting: Infectious Diseases

## 2006-12-08 LAB — CONVERTED CEMR LAB: INR: 1.3

## 2006-12-10 ENCOUNTER — Encounter: Payer: Self-pay | Admitting: Internal Medicine

## 2006-12-16 ENCOUNTER — Ambulatory Visit: Payer: Self-pay | Admitting: Internal Medicine

## 2006-12-16 LAB — CONVERTED CEMR LAB
BUN: 16 mg/dL (ref 6–23)
Glucose, Bld: 186 mg/dL — ABNORMAL HIGH (ref 70–99)
Potassium: 3.9 meq/L (ref 3.5–5.3)
TSH: 0.401 microintl units/mL (ref 0.350–5.50)

## 2006-12-26 ENCOUNTER — Encounter: Payer: Self-pay | Admitting: Internal Medicine

## 2006-12-29 ENCOUNTER — Ambulatory Visit: Payer: Self-pay | Admitting: Hospitalist

## 2006-12-31 ENCOUNTER — Ambulatory Visit: Payer: Self-pay | Admitting: Internal Medicine

## 2006-12-31 ENCOUNTER — Inpatient Hospital Stay (HOSPITAL_COMMUNITY): Admission: AD | Admit: 2006-12-31 | Discharge: 2007-01-06 | Payer: Self-pay | Admitting: Infectious Disease

## 2006-12-31 ENCOUNTER — Encounter (INDEPENDENT_AMBULATORY_CARE_PROVIDER_SITE_OTHER): Payer: Self-pay | Admitting: Internal Medicine

## 2006-12-31 ENCOUNTER — Ambulatory Visit: Payer: Self-pay | Admitting: Infectious Disease

## 2006-12-31 ENCOUNTER — Encounter: Payer: Self-pay | Admitting: Internal Medicine

## 2006-12-31 LAB — CONVERTED CEMR LAB
Bilirubin Urine: NEGATIVE
CO2: 30 meq/L (ref 19–32)
Calcium: 9.2 mg/dL (ref 8.4–10.5)
Creatinine, Ser: 0.74 mg/dL (ref 0.40–1.20)
Eosinophils Absolute: 0 10*3/uL (ref 0.0–0.7)
Eosinophils Relative: 0 % (ref 0–5)
Glucose, Bld: 167 mg/dL — ABNORMAL HIGH (ref 70–99)
HCT: 41.4 % (ref 36.0–46.0)
Lymphs Abs: 0.8 10*3/uL (ref 0.7–3.3)
MCV: 81.2 fL (ref 78.0–100.0)
Monocytes Absolute: 0.6 10*3/uL (ref 0.2–0.7)
Monocytes Relative: 2 % — ABNORMAL LOW (ref 3–11)
Nitrite: POSITIVE — AB
Protein, U semiquant: 100
RBC: 5.1 M/uL (ref 3.87–5.11)
Urobilinogen, UA: 0.2
Urobilinogen, UA: 1 (ref 0.0–1.0)
WBC: 26.3 10*3/uL — ABNORMAL HIGH (ref 4.0–10.5)

## 2007-01-05 ENCOUNTER — Encounter (INDEPENDENT_AMBULATORY_CARE_PROVIDER_SITE_OTHER): Payer: Self-pay | Admitting: *Deleted

## 2007-01-09 ENCOUNTER — Telehealth: Payer: Self-pay | Admitting: *Deleted

## 2007-01-14 ENCOUNTER — Ambulatory Visit: Payer: Self-pay | Admitting: *Deleted

## 2007-01-14 DIAGNOSIS — R32 Unspecified urinary incontinence: Secondary | ICD-10-CM | POA: Insufficient documentation

## 2007-01-15 ENCOUNTER — Telehealth (INDEPENDENT_AMBULATORY_CARE_PROVIDER_SITE_OTHER): Payer: Self-pay | Admitting: Pharmacy Technician

## 2007-01-19 ENCOUNTER — Encounter: Payer: Self-pay | Admitting: Internal Medicine

## 2007-01-20 ENCOUNTER — Ambulatory Visit: Payer: Self-pay | Admitting: Hospitalist

## 2007-01-20 ENCOUNTER — Telehealth (INDEPENDENT_AMBULATORY_CARE_PROVIDER_SITE_OTHER): Payer: Self-pay | Admitting: *Deleted

## 2007-01-20 ENCOUNTER — Ambulatory Visit: Payer: Self-pay | Admitting: Infectious Disease

## 2007-01-20 ENCOUNTER — Encounter: Admission: RE | Admit: 2007-01-20 | Discharge: 2007-01-20 | Payer: Self-pay | Admitting: Hospitalist

## 2007-01-20 ENCOUNTER — Inpatient Hospital Stay (HOSPITAL_COMMUNITY): Admission: AD | Admit: 2007-01-20 | Discharge: 2007-01-21 | Payer: Self-pay | Admitting: Infectious Disease

## 2007-01-26 ENCOUNTER — Telehealth (INDEPENDENT_AMBULATORY_CARE_PROVIDER_SITE_OTHER): Payer: Self-pay | Admitting: Pharmacist

## 2007-02-13 ENCOUNTER — Ambulatory Visit: Payer: Self-pay | Admitting: Internal Medicine

## 2007-02-13 LAB — CONVERTED CEMR LAB
Blood Glucose, Fingerstick: 160
Hgb A1c MFr Bld: 6.7 %
INR: 2.3

## 2007-03-02 ENCOUNTER — Telehealth: Payer: Self-pay | Admitting: Internal Medicine

## 2007-03-16 ENCOUNTER — Ambulatory Visit: Payer: Self-pay | Admitting: Internal Medicine

## 2007-03-16 LAB — CONVERTED CEMR LAB: INR: 3.7

## 2007-03-19 ENCOUNTER — Encounter: Payer: Self-pay | Admitting: Internal Medicine

## 2007-03-25 ENCOUNTER — Encounter: Payer: Self-pay | Admitting: Internal Medicine

## 2007-04-06 ENCOUNTER — Ambulatory Visit: Payer: Self-pay | Admitting: Hospitalist

## 2007-04-06 LAB — CONVERTED CEMR LAB

## 2007-04-14 ENCOUNTER — Telehealth: Payer: Self-pay | Admitting: Internal Medicine

## 2007-04-29 ENCOUNTER — Encounter: Payer: Self-pay | Admitting: Internal Medicine

## 2007-05-04 ENCOUNTER — Encounter: Admission: RE | Admit: 2007-05-04 | Discharge: 2007-05-04 | Payer: Self-pay | Admitting: Infectious Diseases

## 2007-05-04 ENCOUNTER — Ambulatory Visit: Payer: Self-pay | Admitting: Infectious Diseases

## 2007-05-04 ENCOUNTER — Inpatient Hospital Stay (HOSPITAL_COMMUNITY): Admission: AD | Admit: 2007-05-04 | Discharge: 2007-05-08 | Payer: Self-pay | Admitting: Internal Medicine

## 2007-05-04 ENCOUNTER — Ambulatory Visit: Payer: Self-pay | Admitting: Internal Medicine

## 2007-05-04 ENCOUNTER — Encounter (INDEPENDENT_AMBULATORY_CARE_PROVIDER_SITE_OTHER): Payer: Self-pay | Admitting: *Deleted

## 2007-05-04 LAB — CONVERTED CEMR LAB
Albumin: 3 g/dL — ABNORMAL LOW (ref 3.5–5.2)
BUN: 35 mg/dL — ABNORMAL HIGH (ref 6–23)
CO2: 32 meq/L (ref 19–32)
Calcium: 9.3 mg/dL (ref 8.4–10.5)
Chloride: 95 meq/L — ABNORMAL LOW (ref 96–112)
Creatinine, Ser: 1.45 mg/dL — ABNORMAL HIGH (ref 0.40–1.20)
Glucose, Bld: 154 mg/dL — ABNORMAL HIGH (ref 70–99)
Hemoglobin: 12.8 g/dL (ref 12.0–15.0)
Lymphocytes Relative: 6 % — ABNORMAL LOW (ref 12–46)
Lymphs Abs: 1.5 10*3/uL (ref 0.7–4.0)
MCHC: 32.5 g/dL (ref 30.0–36.0)
Monocytes Absolute: 1.3 10*3/uL — ABNORMAL HIGH (ref 0.1–1.0)
Monocytes Relative: 5 % (ref 3–12)
Neutro Abs: 22.7 10*3/uL — ABNORMAL HIGH (ref 1.7–7.7)
Neutrophils Relative %: 89 % — ABNORMAL HIGH (ref 43–77)
Nitrite: NEGATIVE
Potassium: 4.2 meq/L (ref 3.5–5.3)
Protein, U semiquant: >=300
RBC: 4.79 M/uL (ref 3.87–5.11)
WBC: 25.5 10*3/uL — ABNORMAL HIGH (ref 4.0–10.5)
pH: 5

## 2007-05-22 ENCOUNTER — Encounter: Payer: Self-pay | Admitting: Pharmacist

## 2007-05-22 ENCOUNTER — Encounter (INDEPENDENT_AMBULATORY_CARE_PROVIDER_SITE_OTHER): Payer: Self-pay | Admitting: *Deleted

## 2007-05-22 ENCOUNTER — Ambulatory Visit: Payer: Self-pay | Admitting: *Deleted

## 2007-05-22 LAB — CONVERTED CEMR LAB
BUN: 15 mg/dL (ref 6–23)
Bilirubin Urine: NEGATIVE
Blood Glucose, Fingerstick: 158
CO2: 33 meq/L — ABNORMAL HIGH (ref 19–32)
Chloride: 99 meq/L (ref 96–112)
Creatinine, Ser: 0.8 mg/dL (ref 0.40–1.20)
HCT: 38.6 % (ref 36.0–46.0)
Hemoglobin: 12.7 g/dL (ref 12.0–15.0)
INR: 1.4 (ref 0.0–1.5)
Ketones, urine, test strip: NEGATIVE
Platelets: 320 10*3/uL (ref 150–400)
Potassium: 4.3 meq/L (ref 3.5–5.3)
RDW: 16.9 % — ABNORMAL HIGH (ref 11.5–15.5)
Sed Rate: 27 mm/hr — ABNORMAL HIGH (ref 0–22)
Specific Gravity, Urine: 1.01
TSH: 2.671 microintl units/mL (ref 0.350–5.50)
Total CK: 42 units/L (ref 7–177)
WBC: 7.8 10*3/uL (ref 4.0–10.5)
pH: 7.5

## 2007-05-25 ENCOUNTER — Telehealth: Payer: Self-pay | Admitting: Internal Medicine

## 2007-06-01 ENCOUNTER — Ambulatory Visit: Payer: Self-pay | Admitting: Internal Medicine

## 2007-06-01 ENCOUNTER — Encounter (INDEPENDENT_AMBULATORY_CARE_PROVIDER_SITE_OTHER): Payer: Self-pay | Admitting: Internal Medicine

## 2007-06-01 DIAGNOSIS — E785 Hyperlipidemia, unspecified: Secondary | ICD-10-CM

## 2007-06-01 LAB — CONVERTED CEMR LAB: Blood Glucose, Fingerstick: 207

## 2007-06-17 ENCOUNTER — Encounter: Payer: Self-pay | Admitting: Internal Medicine

## 2007-06-22 ENCOUNTER — Encounter: Payer: Self-pay | Admitting: Internal Medicine

## 2007-06-22 ENCOUNTER — Ambulatory Visit: Payer: Self-pay | Admitting: Hospitalist

## 2007-06-22 ENCOUNTER — Encounter (INDEPENDENT_AMBULATORY_CARE_PROVIDER_SITE_OTHER): Payer: Self-pay | Admitting: Internal Medicine

## 2007-06-22 LAB — CONVERTED CEMR LAB
Albumin: 4.1 g/dL (ref 3.5–5.2)
BUN: 19 mg/dL (ref 6–23)
CO2: 28 meq/L (ref 19–32)
Calcium: 9.1 mg/dL (ref 8.4–10.5)
Cholesterol: 131 mg/dL (ref 0–200)
Glucose, Bld: 140 mg/dL — ABNORMAL HIGH (ref 70–99)
HDL: 51 mg/dL (ref >39)
LDL Cholesterol: 63 mg/dL (ref 0–99)
Potassium: 4.3 meq/L (ref 3.5–5.3)
Sodium: 140 meq/L (ref 135–145)
Total Protein: 7.2 g/dL (ref 6.0–8.3)
Triglycerides: 86 mg/dL (ref <150)

## 2007-07-16 ENCOUNTER — Ambulatory Visit: Payer: Self-pay | Admitting: Internal Medicine

## 2007-07-17 ENCOUNTER — Telehealth: Payer: Self-pay | Admitting: Internal Medicine

## 2007-08-07 ENCOUNTER — Ambulatory Visit: Payer: Self-pay | Admitting: Internal Medicine

## 2007-08-07 LAB — CONVERTED CEMR LAB
BUN: 20 mg/dL (ref 6–23)
Blood Glucose, Fingerstick: 256
Chloride: 100 meq/L (ref 96–112)
Hgb A1c MFr Bld: 6.8 %
INR: 2.4
Potassium: 4.2 meq/L (ref 3.5–5.3)
Sodium: 141 meq/L (ref 135–145)

## 2007-08-10 ENCOUNTER — Encounter: Payer: Self-pay | Admitting: Internal Medicine

## 2007-08-10 ENCOUNTER — Telehealth (INDEPENDENT_AMBULATORY_CARE_PROVIDER_SITE_OTHER): Payer: Self-pay | Admitting: Pharmacist

## 2007-08-12 ENCOUNTER — Telehealth: Payer: Self-pay | Admitting: Internal Medicine

## 2007-08-16 ENCOUNTER — Encounter: Payer: Self-pay | Admitting: Internal Medicine

## 2007-08-25 ENCOUNTER — Ambulatory Visit: Payer: Self-pay | Admitting: Internal Medicine

## 2007-08-25 ENCOUNTER — Telehealth: Payer: Self-pay | Admitting: *Deleted

## 2007-08-25 LAB — CONVERTED CEMR LAB
HCT: 28.4 % — ABNORMAL LOW (ref 36.0–46.0)
INR: 2.9
Platelets: 345 10*3/uL (ref 150–400)
RBC: 3.31 M/uL — ABNORMAL LOW (ref 3.87–5.11)
WBC: 9.1 10*3/uL (ref 4.0–10.5)

## 2007-08-26 ENCOUNTER — Ambulatory Visit: Payer: Self-pay | Admitting: Internal Medicine

## 2007-08-26 ENCOUNTER — Encounter: Payer: Self-pay | Admitting: *Deleted

## 2007-08-26 ENCOUNTER — Ambulatory Visit: Payer: Self-pay | Admitting: *Deleted

## 2007-08-26 LAB — CONVERTED CEMR LAB
AST: 18 units/L (ref 0–37)
BUN: 14 mg/dL (ref 6–23)
CO2: 35 meq/L — ABNORMAL HIGH (ref 19–32)
Calcium: 9.2 mg/dL (ref 8.4–10.5)
Chloride: 98 meq/L (ref 96–112)
Creatinine, Ser: 0.7 mg/dL (ref 0.40–1.20)
HCT: 29.1 % — ABNORMAL LOW (ref 36.0–46.0)
Hemoglobin: 10 g/dL — ABNORMAL LOW (ref 12.0–15.0)
MCV: 85.9 fL (ref 78.0–100.0)
RDW: 14.8 % (ref 11.5–15.5)
Total Bilirubin: 1.3 mg/dL — ABNORMAL HIGH (ref 0.3–1.2)

## 2007-09-02 ENCOUNTER — Encounter: Payer: Self-pay | Admitting: Internal Medicine

## 2007-09-02 ENCOUNTER — Ambulatory Visit: Payer: Self-pay | Admitting: *Deleted

## 2007-09-02 LAB — CONVERTED CEMR LAB
HCT: 31.3 % — ABNORMAL LOW (ref 36.0–46.0)
Hemoglobin: 10.2 g/dL — ABNORMAL LOW (ref 12.0–15.0)
WBC: 8.5 10*3/uL (ref 4.0–10.5)

## 2007-09-04 ENCOUNTER — Emergency Department (HOSPITAL_COMMUNITY): Admission: EM | Admit: 2007-09-04 | Discharge: 2007-09-04 | Payer: Self-pay | Admitting: Emergency Medicine

## 2007-09-04 ENCOUNTER — Encounter (INDEPENDENT_AMBULATORY_CARE_PROVIDER_SITE_OTHER): Payer: Self-pay | Admitting: Internal Medicine

## 2007-09-11 ENCOUNTER — Encounter (INDEPENDENT_AMBULATORY_CARE_PROVIDER_SITE_OTHER): Payer: Self-pay | Admitting: *Deleted

## 2007-09-11 ENCOUNTER — Ambulatory Visit: Payer: Self-pay | Admitting: *Deleted

## 2007-09-11 LAB — CONVERTED CEMR LAB
HCT: 43.1 % (ref 36.0–46.0)
MCV: 89.8 fL (ref 78.0–100.0)
RBC: 4.8 M/uL (ref 3.87–5.11)
WBC: 7.6 10*3/uL (ref 4.0–10.5)

## 2007-09-15 ENCOUNTER — Encounter: Payer: Self-pay | Admitting: Internal Medicine

## 2007-09-21 ENCOUNTER — Telehealth (INDEPENDENT_AMBULATORY_CARE_PROVIDER_SITE_OTHER): Payer: Self-pay | Admitting: Pharmacist

## 2007-10-05 ENCOUNTER — Ambulatory Visit: Payer: Self-pay | Admitting: Internal Medicine

## 2007-10-05 LAB — CONVERTED CEMR LAB
Hemoglobin: 13.1 g/dL (ref 12.0–15.0)
INR: 3.4
RBC: 4.74 M/uL (ref 3.87–5.11)
WBC: 8 10*3/uL (ref 4.0–10.5)

## 2007-10-06 ENCOUNTER — Ambulatory Visit: Payer: Self-pay | Admitting: Gastroenterology

## 2007-10-13 ENCOUNTER — Ambulatory Visit: Payer: Self-pay | Admitting: Internal Medicine

## 2007-10-13 LAB — CONVERTED CEMR LAB
BUN: 18 mg/dL (ref 6–23)
CO2: 32 meq/L (ref 19–32)
Calcium: 9.4 mg/dL (ref 8.4–10.5)
Creatinine, Ser: 0.74 mg/dL (ref 0.40–1.20)
Glucose, Bld: 158 mg/dL — ABNORMAL HIGH (ref 70–99)

## 2007-10-20 ENCOUNTER — Encounter: Payer: Self-pay | Admitting: Internal Medicine

## 2007-10-26 ENCOUNTER — Ambulatory Visit: Payer: Self-pay | Admitting: Internal Medicine

## 2007-10-26 ENCOUNTER — Encounter: Payer: Self-pay | Admitting: Internal Medicine

## 2007-10-26 LAB — CONVERTED CEMR LAB
MCHC: 29.7 g/dL — ABNORMAL LOW (ref 30.0–36.0)
MCV: 88.4 fL (ref 78.0–100.0)
Platelets: 261 10*3/uL (ref 150–400)
RDW: 16 % — ABNORMAL HIGH (ref 11.5–15.5)

## 2007-11-06 ENCOUNTER — Encounter: Payer: Self-pay | Admitting: Internal Medicine

## 2007-11-23 ENCOUNTER — Ambulatory Visit: Payer: Self-pay | Admitting: Internal Medicine

## 2007-11-23 LAB — CONVERTED CEMR LAB
Eosinophils Absolute: 0.2 10*3/uL (ref 0.0–0.7)
Eosinophils Relative: 3 % (ref 0–5)
Hemoglobin: 13 g/dL (ref 12.0–15.0)
MCHC: 31.7 g/dL (ref 30.0–36.0)
Neutro Abs: 5.8 10*3/uL (ref 1.7–7.7)
Neutrophils Relative %: 72 % (ref 43–77)
Platelets: 268 10*3/uL (ref 150–400)
RDW: 16.2 % — ABNORMAL HIGH (ref 11.5–15.5)

## 2007-11-27 ENCOUNTER — Ambulatory Visit: Payer: Self-pay | Admitting: Internal Medicine

## 2007-12-21 ENCOUNTER — Ambulatory Visit: Payer: Self-pay | Admitting: Internal Medicine

## 2007-12-21 LAB — CONVERTED CEMR LAB: INR: 2.7

## 2007-12-28 ENCOUNTER — Ambulatory Visit: Payer: Self-pay | Admitting: Gastroenterology

## 2008-01-18 ENCOUNTER — Ambulatory Visit: Payer: Self-pay | Admitting: *Deleted

## 2008-01-18 LAB — CONVERTED CEMR LAB: INR: 2.6

## 2008-01-22 ENCOUNTER — Inpatient Hospital Stay (HOSPITAL_COMMUNITY): Admission: EM | Admit: 2008-01-22 | Discharge: 2008-01-28 | Payer: Self-pay | Admitting: Emergency Medicine

## 2008-01-22 ENCOUNTER — Ambulatory Visit: Payer: Self-pay | Admitting: Internal Medicine

## 2008-01-28 ENCOUNTER — Encounter (INDEPENDENT_AMBULATORY_CARE_PROVIDER_SITE_OTHER): Payer: Self-pay | Admitting: *Deleted

## 2008-02-02 ENCOUNTER — Telehealth (INDEPENDENT_AMBULATORY_CARE_PROVIDER_SITE_OTHER): Payer: Self-pay | Admitting: *Deleted

## 2008-02-05 ENCOUNTER — Encounter: Payer: Self-pay | Admitting: Internal Medicine

## 2008-02-15 ENCOUNTER — Ambulatory Visit: Payer: Self-pay | Admitting: Internal Medicine

## 2008-02-15 ENCOUNTER — Encounter (INDEPENDENT_AMBULATORY_CARE_PROVIDER_SITE_OTHER): Payer: Self-pay | Admitting: Internal Medicine

## 2008-02-15 LAB — CONVERTED CEMR LAB
BUN: 16 mg/dL (ref 6–23)
Chloride: 100 meq/L (ref 96–112)
Creatinine, Ser: 0.82 mg/dL (ref 0.40–1.20)
Free T4: 1.71 ng/dL (ref 0.89–1.80)
INR: 2.2
Potassium: 4.3 meq/L (ref 3.5–5.3)
TSH: 1.228 microintl units/mL (ref 0.350–4.50)

## 2008-03-04 ENCOUNTER — Ambulatory Visit: Payer: Self-pay | Admitting: Infectious Disease

## 2008-03-08 ENCOUNTER — Encounter: Payer: Self-pay | Admitting: *Deleted

## 2008-03-08 LAB — CONVERTED CEMR LAB: LDL Cholesterol: 107 mg/dL

## 2008-03-14 ENCOUNTER — Encounter: Payer: Self-pay | Admitting: Internal Medicine

## 2008-03-14 ENCOUNTER — Ambulatory Visit: Payer: Self-pay | Admitting: Internal Medicine

## 2008-03-14 LAB — CONVERTED CEMR LAB
CO2: 23 meq/L (ref 19–32)
Calcium: 9.3 mg/dL (ref 8.4–10.5)
Creatinine, Ser: 0.79 mg/dL (ref 0.40–1.20)
Glucose, Bld: 267 mg/dL — ABNORMAL HIGH (ref 70–99)
Sodium: 139 meq/L (ref 135–145)

## 2008-03-25 ENCOUNTER — Telehealth (INDEPENDENT_AMBULATORY_CARE_PROVIDER_SITE_OTHER): Payer: Self-pay | Admitting: Internal Medicine

## 2008-03-31 ENCOUNTER — Telehealth (INDEPENDENT_AMBULATORY_CARE_PROVIDER_SITE_OTHER): Payer: Self-pay | Admitting: *Deleted

## 2008-03-31 ENCOUNTER — Ambulatory Visit: Payer: Self-pay | Admitting: *Deleted

## 2008-03-31 ENCOUNTER — Encounter (INDEPENDENT_AMBULATORY_CARE_PROVIDER_SITE_OTHER): Payer: Self-pay | Admitting: *Deleted

## 2008-03-31 DIAGNOSIS — N390 Urinary tract infection, site not specified: Secondary | ICD-10-CM | POA: Insufficient documentation

## 2008-03-31 LAB — CONVERTED CEMR LAB
Bilirubin Urine: NEGATIVE
Bilirubin Urine: NEGATIVE
Ketones, ur: NEGATIVE mg/dL
Nitrite: NEGATIVE
Protein, ur: 30 mg/dL — AB
Specific Gravity, Urine: 1.015
Specific Gravity, Urine: 1.016 (ref 1.005–1.03)
Urobilinogen, UA: 0.2
Urobilinogen, UA: 0.2 (ref 0.0–1.0)
pH: 6.5

## 2008-04-05 ENCOUNTER — Encounter: Payer: Self-pay | Admitting: Internal Medicine

## 2008-04-08 ENCOUNTER — Ambulatory Visit: Payer: Self-pay | Admitting: Internal Medicine

## 2008-04-25 ENCOUNTER — Ambulatory Visit: Payer: Self-pay | Admitting: *Deleted

## 2008-04-25 LAB — CONVERTED CEMR LAB: INR: 4

## 2008-05-23 ENCOUNTER — Ambulatory Visit: Payer: Self-pay | Admitting: Internal Medicine

## 2008-05-23 LAB — CONVERTED CEMR LAB

## 2008-05-24 ENCOUNTER — Ambulatory Visit: Payer: Self-pay | Admitting: Internal Medicine

## 2008-05-24 ENCOUNTER — Telehealth: Payer: Self-pay | Admitting: Internal Medicine

## 2008-05-24 ENCOUNTER — Encounter: Payer: Self-pay | Admitting: *Deleted

## 2008-05-24 ENCOUNTER — Inpatient Hospital Stay (HOSPITAL_COMMUNITY): Admission: EM | Admit: 2008-05-24 | Discharge: 2008-06-01 | Payer: Self-pay | Admitting: Emergency Medicine

## 2008-05-24 DIAGNOSIS — R4182 Altered mental status, unspecified: Secondary | ICD-10-CM

## 2008-05-26 ENCOUNTER — Encounter (INDEPENDENT_AMBULATORY_CARE_PROVIDER_SITE_OTHER): Payer: Self-pay | Admitting: Internal Medicine

## 2008-06-01 ENCOUNTER — Encounter (INDEPENDENT_AMBULATORY_CARE_PROVIDER_SITE_OTHER): Payer: Self-pay | Admitting: *Deleted

## 2008-06-06 ENCOUNTER — Ambulatory Visit: Payer: Self-pay | Admitting: Internal Medicine

## 2008-06-06 LAB — CONVERTED CEMR LAB

## 2008-06-13 ENCOUNTER — Ambulatory Visit: Payer: Self-pay | Admitting: Internal Medicine

## 2008-06-17 ENCOUNTER — Encounter: Payer: Self-pay | Admitting: Internal Medicine

## 2008-06-20 ENCOUNTER — Ambulatory Visit: Payer: Self-pay | Admitting: Internal Medicine

## 2008-06-20 LAB — CONVERTED CEMR LAB

## 2008-07-04 ENCOUNTER — Ambulatory Visit: Payer: Self-pay | Admitting: *Deleted

## 2008-07-04 LAB — CONVERTED CEMR LAB: INR: 1.6

## 2008-07-27 ENCOUNTER — Telehealth (INDEPENDENT_AMBULATORY_CARE_PROVIDER_SITE_OTHER): Payer: Self-pay | Admitting: Internal Medicine

## 2008-07-29 ENCOUNTER — Ambulatory Visit: Payer: Self-pay | Admitting: Internal Medicine

## 2008-07-29 DIAGNOSIS — R300 Dysuria: Secondary | ICD-10-CM | POA: Insufficient documentation

## 2008-07-29 LAB — CONVERTED CEMR LAB
BUN: 25 mg/dL — ABNORMAL HIGH (ref 6–23)
Blood Glucose, Fingerstick: 183
Calcium: 9.6 mg/dL (ref 8.4–10.5)
GFR calc Af Amer: >60 mL/min (ref >60)
GFR calc non Af Amer: >60 mL/min (ref >60)
Glucose, Bld: 194 mg/dL — ABNORMAL HIGH (ref 70–99)
Hgb A1c MFr Bld: 6.7 %
Sodium: 142 meq/L (ref 135–145)

## 2008-08-01 ENCOUNTER — Telehealth (INDEPENDENT_AMBULATORY_CARE_PROVIDER_SITE_OTHER): Payer: Self-pay | Admitting: Pharmacist

## 2008-08-12 ENCOUNTER — Encounter: Payer: Self-pay | Admitting: Internal Medicine

## 2008-08-15 ENCOUNTER — Ambulatory Visit: Payer: Self-pay | Admitting: Internal Medicine

## 2008-08-15 LAB — CONVERTED CEMR LAB

## 2008-08-24 ENCOUNTER — Telehealth: Payer: Self-pay | Admitting: *Deleted

## 2008-08-25 ENCOUNTER — Ambulatory Visit: Payer: Self-pay | Admitting: Internal Medicine

## 2008-08-25 ENCOUNTER — Encounter: Payer: Self-pay | Admitting: Internal Medicine

## 2008-08-25 LAB — CONVERTED CEMR LAB
ALT: 16 units/L (ref 0–35)
AST: 26 units/L (ref 0–37)
Albumin: 3 g/dL — ABNORMAL LOW (ref 3.5–5.2)
Basophils Absolute: 0 10*3/uL (ref 0.0–0.1)
Calcium: 9.5 mg/dL (ref 8.4–10.5)
Chloride: 98 meq/L (ref 96–112)
Eosinophils Relative: 2 % (ref 0–5)
HCT: 36.9 % (ref 36.0–46.0)
INR: 5.2
INR: 3.6 — ABNORMAL HIGH (ref 0.0–1.5)
Ketones, ur: NEGATIVE mg/dL
Ketones, urine, test strip: NEGATIVE
Lymphocytes Relative: 19 % (ref 12–46)
Neutro Abs: 5.4 10*3/uL (ref 1.7–7.7)
Nitrite: NEGATIVE
Platelets: 280 10*3/uL (ref 150–400)
Potassium: 4.1 meq/L (ref 3.5–5.3)
RDW: 17 % — ABNORMAL HIGH (ref 11.5–15.5)
Total Protein: 7.6 g/dL (ref 6.0–8.3)
Urobilinogen, UA: 0.2
pH: 6.5 (ref 5.0–8.0)

## 2008-08-30 ENCOUNTER — Ambulatory Visit: Payer: Self-pay | Admitting: Infectious Diseases

## 2008-08-30 ENCOUNTER — Encounter: Payer: Self-pay | Admitting: Pharmacist

## 2008-08-30 LAB — CONVERTED CEMR LAB: INR: 6.5

## 2008-09-05 ENCOUNTER — Ambulatory Visit: Payer: Self-pay | Admitting: Infectious Diseases

## 2008-09-05 LAB — CONVERTED CEMR LAB: INR: 3.8

## 2008-09-26 ENCOUNTER — Telehealth: Payer: Self-pay | Admitting: Internal Medicine

## 2008-10-03 ENCOUNTER — Ambulatory Visit: Payer: Self-pay | Admitting: Internal Medicine

## 2008-10-05 ENCOUNTER — Ambulatory Visit: Payer: Self-pay | Admitting: Internal Medicine

## 2008-10-05 ENCOUNTER — Telehealth: Payer: Self-pay | Admitting: Internal Medicine

## 2008-10-05 ENCOUNTER — Inpatient Hospital Stay (HOSPITAL_COMMUNITY): Admission: EM | Admit: 2008-10-05 | Discharge: 2008-10-08 | Payer: Self-pay | Admitting: Internal Medicine

## 2008-10-05 ENCOUNTER — Encounter: Payer: Self-pay | Admitting: Internal Medicine

## 2008-10-05 LAB — CONVERTED CEMR LAB: Blood Glucose, Fingerstick: 104

## 2008-10-07 ENCOUNTER — Encounter: Payer: Self-pay | Admitting: Internal Medicine

## 2008-10-11 ENCOUNTER — Ambulatory Visit: Payer: Self-pay | Admitting: Internal Medicine

## 2008-10-12 ENCOUNTER — Telehealth: Payer: Self-pay | Admitting: *Deleted

## 2008-10-17 ENCOUNTER — Ambulatory Visit: Payer: Self-pay | Admitting: Internal Medicine

## 2008-10-17 LAB — CONVERTED CEMR LAB: INR: 2.2

## 2008-10-27 ENCOUNTER — Telehealth: Payer: Self-pay | Admitting: Internal Medicine

## 2008-10-28 ENCOUNTER — Ambulatory Visit: Payer: Self-pay | Admitting: Internal Medicine

## 2008-10-28 LAB — CONVERTED CEMR LAB
CO2: 27 meq/L (ref 19–32)
Chloride: 101 meq/L (ref 96–112)
Potassium: 4.2 meq/L (ref 3.5–5.3)
Sodium: 138 meq/L (ref 135–145)

## 2008-11-14 ENCOUNTER — Ambulatory Visit: Payer: Self-pay | Admitting: Internal Medicine

## 2008-11-30 ENCOUNTER — Encounter: Payer: Self-pay | Admitting: Internal Medicine

## 2008-12-06 ENCOUNTER — Encounter: Payer: Self-pay | Admitting: Pharmacist

## 2008-12-06 ENCOUNTER — Ambulatory Visit: Payer: Self-pay | Admitting: Internal Medicine

## 2008-12-06 LAB — CONVERTED CEMR LAB
INR: 1.6
INR: 1.6

## 2008-12-19 ENCOUNTER — Ambulatory Visit: Payer: Self-pay | Admitting: Internal Medicine

## 2008-12-19 LAB — CONVERTED CEMR LAB: INR: 3.5

## 2009-01-16 ENCOUNTER — Ambulatory Visit: Payer: Self-pay | Admitting: Internal Medicine

## 2009-01-16 LAB — CONVERTED CEMR LAB: INR: 3

## 2009-02-13 ENCOUNTER — Ambulatory Visit: Payer: Self-pay | Admitting: Internal Medicine

## 2009-02-13 LAB — CONVERTED CEMR LAB: INR: 2.3

## 2009-02-22 ENCOUNTER — Telehealth (INDEPENDENT_AMBULATORY_CARE_PROVIDER_SITE_OTHER): Payer: Self-pay | Admitting: *Deleted

## 2009-03-10 ENCOUNTER — Encounter: Payer: Self-pay | Admitting: Pharmacist

## 2009-03-10 ENCOUNTER — Ambulatory Visit: Payer: Self-pay | Admitting: Internal Medicine

## 2009-03-10 DIAGNOSIS — E119 Type 2 diabetes mellitus without complications: Secondary | ICD-10-CM

## 2009-03-10 LAB — CONVERTED CEMR LAB
Calcium: 9.6 mg/dL (ref 8.4–10.5)
INR: 1.6
Potassium: 4.1 meq/L (ref 3.5–5.3)
Sodium: 140 meq/L (ref 135–145)

## 2009-03-27 ENCOUNTER — Ambulatory Visit: Payer: Self-pay | Admitting: Internal Medicine

## 2009-03-27 LAB — CONVERTED CEMR LAB: INR: 4.3

## 2009-04-10 ENCOUNTER — Ambulatory Visit: Payer: Self-pay | Admitting: Internal Medicine

## 2009-04-10 LAB — CONVERTED CEMR LAB

## 2009-04-17 ENCOUNTER — Ambulatory Visit: Payer: Self-pay | Admitting: Internal Medicine

## 2009-04-17 LAB — CONVERTED CEMR LAB: INR: 2

## 2009-04-27 ENCOUNTER — Ambulatory Visit: Payer: Self-pay | Admitting: Internal Medicine

## 2009-04-27 ENCOUNTER — Encounter: Payer: Self-pay | Admitting: Pharmacist

## 2009-04-27 DIAGNOSIS — R05 Cough: Secondary | ICD-10-CM

## 2009-05-01 ENCOUNTER — Ambulatory Visit: Payer: Self-pay | Admitting: Infectious Diseases

## 2009-05-01 LAB — CONVERTED CEMR LAB: INR: 2.2

## 2009-05-01 IMAGING — CT CT PELVIS W/ CM
2 of 6 series · 13 of 32 positions shown, 18 images · IV contrast (OMNI 300/WATER & 100 ML OMNI 300)
Comparison: CT 06/16/06.

CLINICAL DATA: Status post ventral hernia repair.  Nonhealing wound.  MRSA.
 ABDOMEN CT WITH CONTRAST:
TECHNIQUE: Multidetector CT imaging of the abdomen was performed following the standard protocol during bolus administration of intravenous contrast.
 Contrast:  80 ml Omnipaque 300 IV.
TECHNIQUE: Multidetector CT imaging of the pelvis was performed following the standard protocol during bolus administration of intravenous contrast.

[Series 2: routine abdomen · axial · 0.98mm/px · z∈[-422,-72]mm · 6 of 98 slices shown, 11 images]
[im 14/98  soft-tissue]
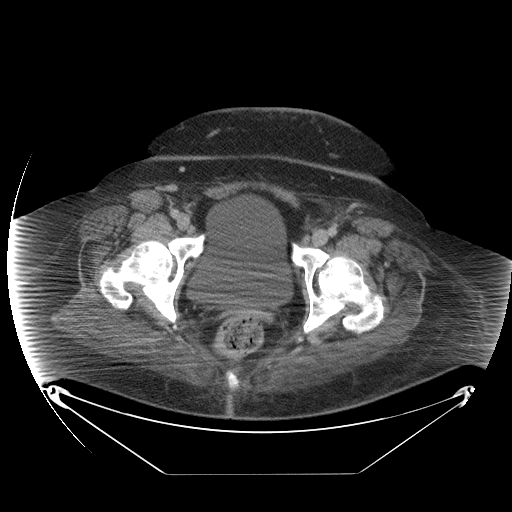
[im 14/98  bone]
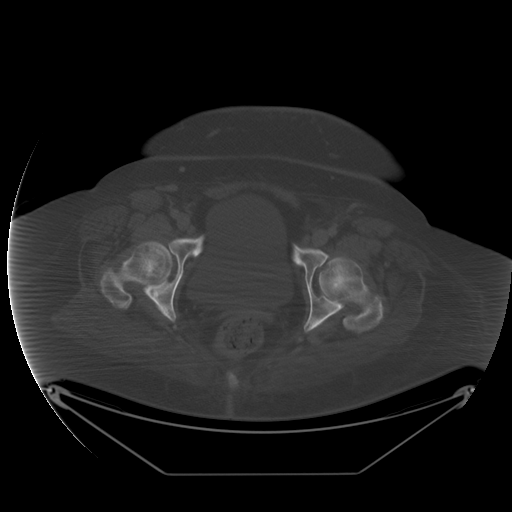
[im 28/98  soft-tissue]
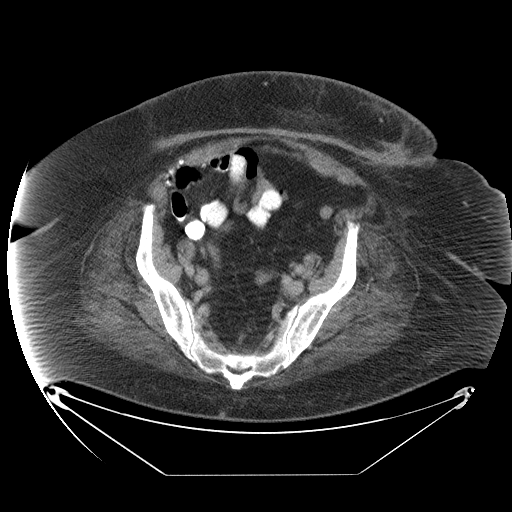
[im 42/98  soft-tissue]
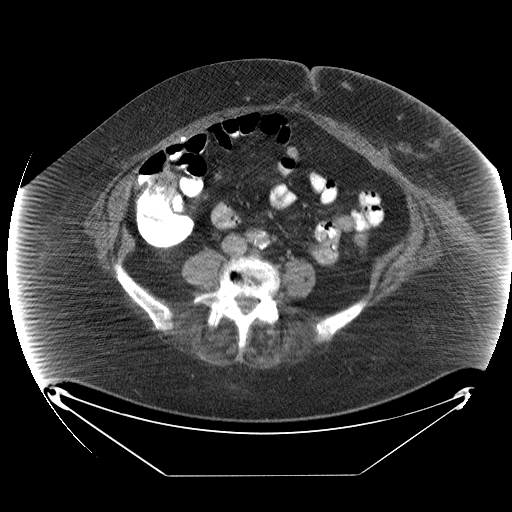
[im 42/98  lung]
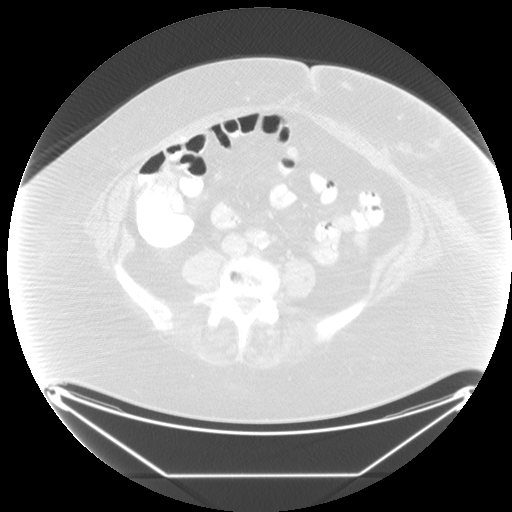
[im 56/98  soft-tissue]
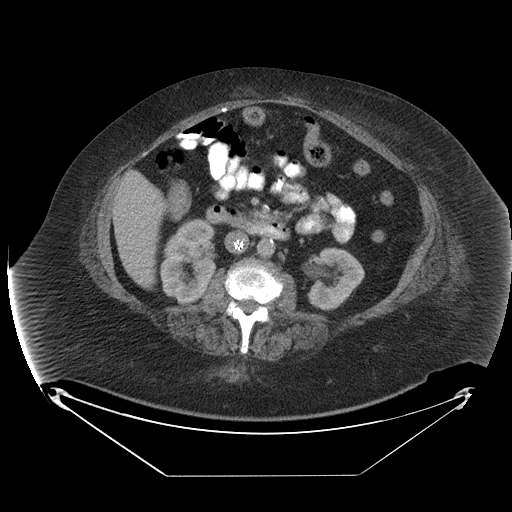
[im 56/98  lung]
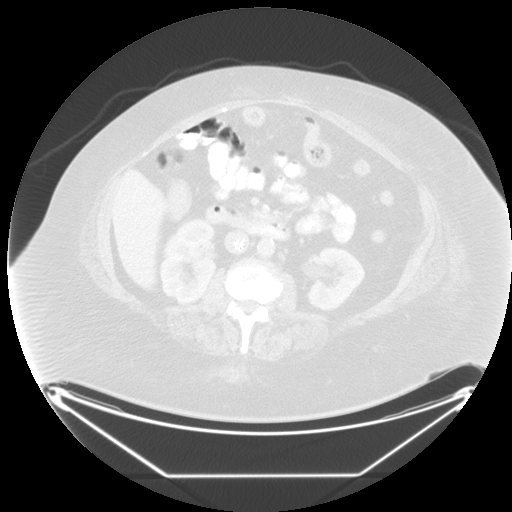
[im 70/98  soft-tissue]
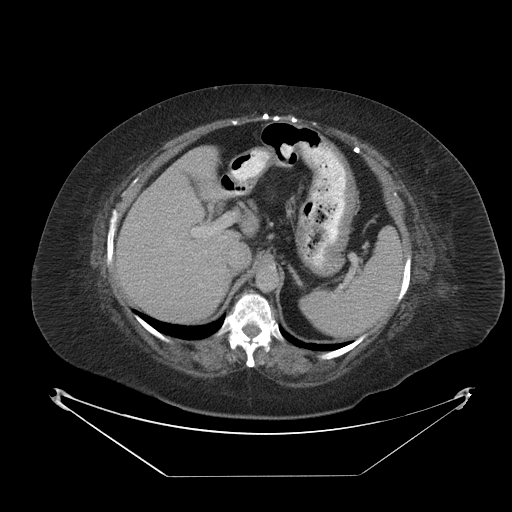
[im 70/98  lung]
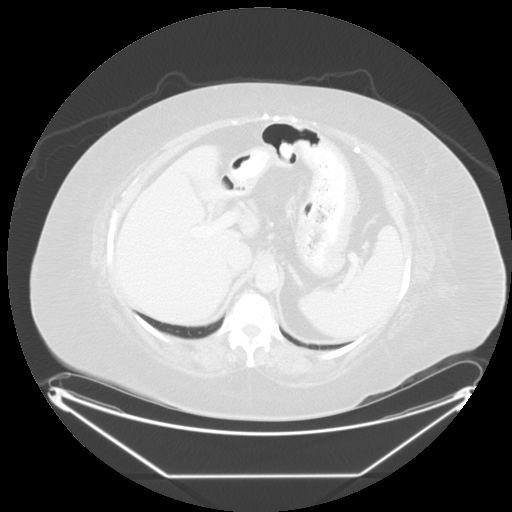
[im 84/98  soft-tissue]
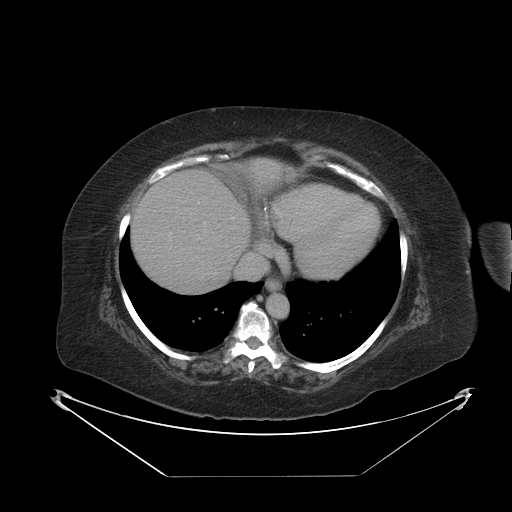
[im 84/98  lung]
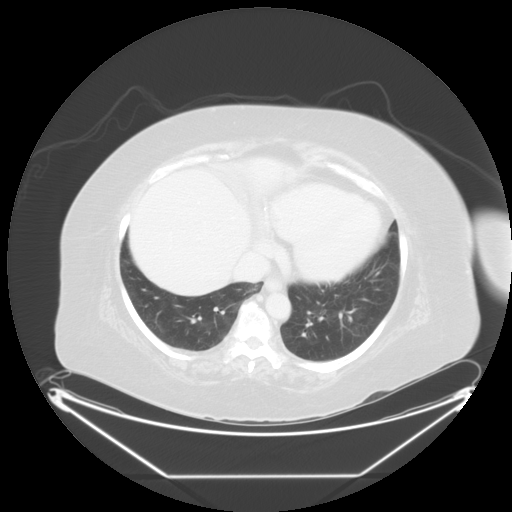

[Series 400: sag abd · sagittal · 0.98mm/px · 7 of 105 slices shown]
[im 14/105  soft-tissue]
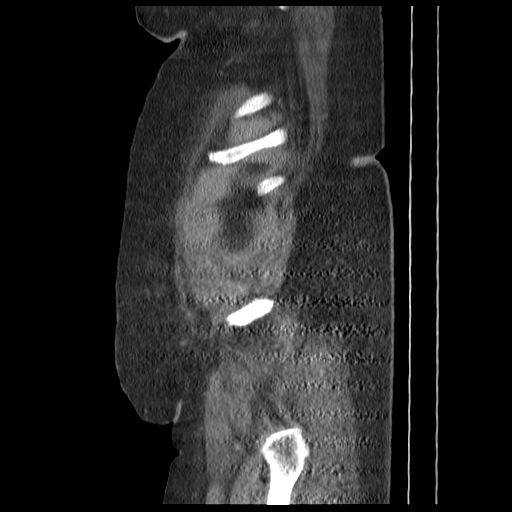
[im 27/105  soft-tissue]
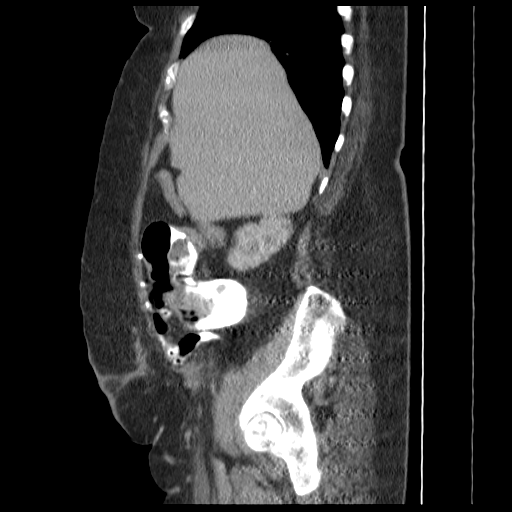
[im 40/105  soft-tissue]
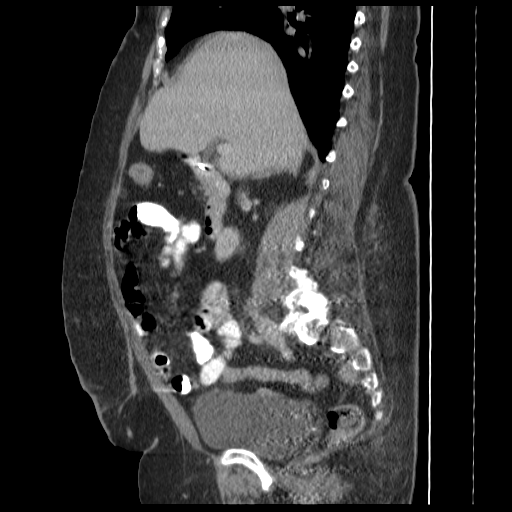
[im 53/105  soft-tissue]
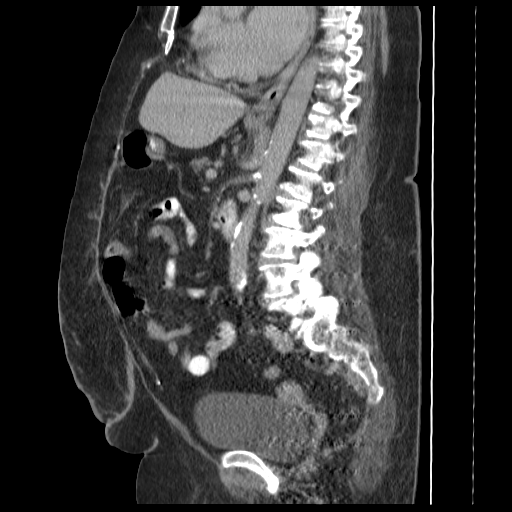
[im 66/105  soft-tissue]
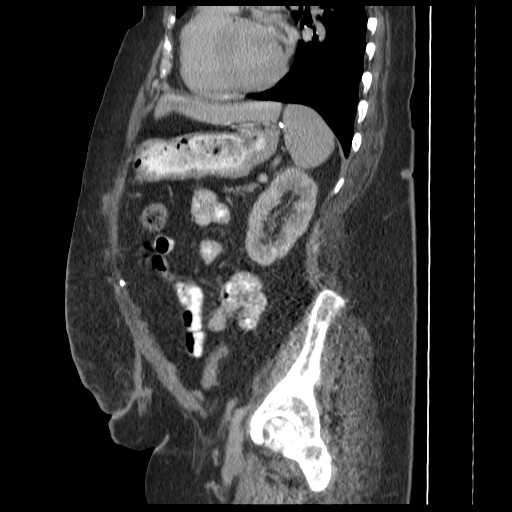
[im 79/105  soft-tissue]
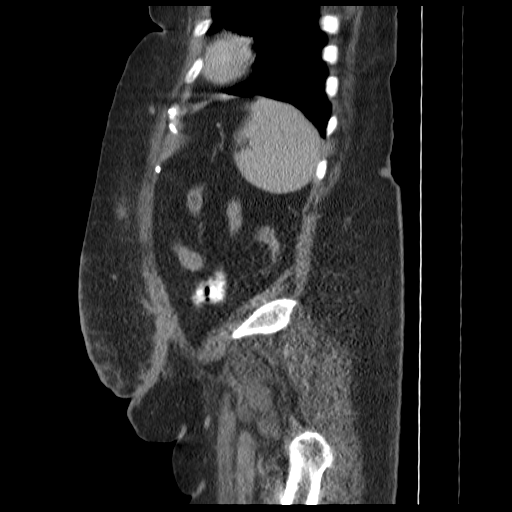
[im 92/105  soft-tissue]
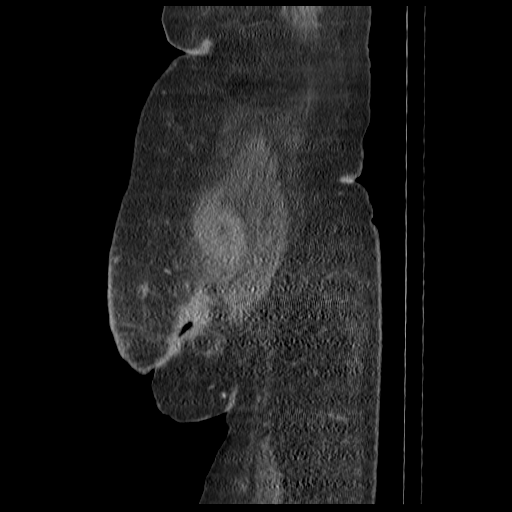

[13 of 32 positions shown; findings below may reference images not displayed]

FINDINGS: The liver and spleen are normal.  There are surgical clips around the stomach.  There is an infrarenal IVC filter in good position unchanged from the prior study.  The kidneys show no obstruction.  There is a small 5 mm stone in the left mid kidney similar to the prior study.  There is no mass or adenopathy.  The bowel is not dilated.  No fluid collections are identified.
IMPRESSION: No acute abnormality.  
 PELVIS CT WITH CONTRAST:
FINDINGS: The very large subcutaneous fluid collection in the anterior abdominal wall has resolved.  There is some soft tissue thickening in the left lateral abdomen and subcutaneous tissues.  There appears to be a nonhealing wound here with some gas extending down to near the abdominal wall but not into the anterior abdominal wall.  This is the location of the previously noted fluid collection.  No residual fluid is present.  
 There is no fluid or mass within the true pelvis.  The uterus is normal and the bowel is not dilated or thickening.
IMPRESSION: Status post ventral hernia repair with mesh.  There is a nonhealing wound in the left abdomen containing some soft tissue thickening and gas.  No residual fluid collection is identified.

## 2009-05-15 ENCOUNTER — Encounter: Payer: Self-pay | Admitting: Internal Medicine

## 2009-05-17 ENCOUNTER — Telehealth: Payer: Self-pay | Admitting: Internal Medicine

## 2009-05-19 ENCOUNTER — Ambulatory Visit: Payer: Self-pay | Admitting: Internal Medicine

## 2009-05-20 LAB — CONVERTED CEMR LAB
Creatinine, Urine: 30.4 mg/dL
Microalb Creat Ratio: 48 mg/g — ABNORMAL HIGH (ref 0.0–30.0)

## 2009-05-22 ENCOUNTER — Telehealth (INDEPENDENT_AMBULATORY_CARE_PROVIDER_SITE_OTHER): Payer: Self-pay | Admitting: Pharmacist

## 2009-06-13 ENCOUNTER — Telehealth: Payer: Self-pay | Admitting: Internal Medicine

## 2009-06-19 ENCOUNTER — Ambulatory Visit: Payer: Self-pay | Admitting: Internal Medicine

## 2009-06-19 LAB — CONVERTED CEMR LAB

## 2009-07-18 ENCOUNTER — Ambulatory Visit: Payer: Self-pay | Admitting: Internal Medicine

## 2009-07-18 LAB — CONVERTED CEMR LAB
Calcium: 9.2 mg/dL (ref 8.4–10.5)
Creatinine, Ser: 0.93 mg/dL (ref 0.40–1.20)
Glucose, Bld: 140 mg/dL — ABNORMAL HIGH (ref 70–99)
Sodium: 134 meq/L — ABNORMAL LOW (ref 135–145)

## 2009-08-04 ENCOUNTER — Telehealth (INDEPENDENT_AMBULATORY_CARE_PROVIDER_SITE_OTHER): Payer: Self-pay | Admitting: Pharmacist

## 2009-08-15 ENCOUNTER — Ambulatory Visit: Payer: Self-pay | Admitting: Internal Medicine

## 2009-08-15 ENCOUNTER — Telehealth: Payer: Self-pay | Admitting: Internal Medicine

## 2009-08-15 LAB — CONVERTED CEMR LAB
Blood in Urine, dipstick: NEGATIVE
Glucose, Urine, Semiquant: NEGATIVE
Nitrite: NEGATIVE
Specific Gravity, Urine: 1.015
pH: 5

## 2009-08-21 ENCOUNTER — Ambulatory Visit: Payer: Self-pay | Admitting: Internal Medicine

## 2009-08-21 LAB — CONVERTED CEMR LAB: INR: 2

## 2009-09-04 ENCOUNTER — Ambulatory Visit: Payer: Self-pay | Admitting: Internal Medicine

## 2009-09-04 ENCOUNTER — Encounter: Payer: Self-pay | Admitting: Pharmacist

## 2009-09-04 LAB — CONVERTED CEMR LAB: INR: 2

## 2009-09-13 ENCOUNTER — Telehealth: Payer: Self-pay | Admitting: Internal Medicine

## 2009-10-05 ENCOUNTER — Telehealth: Payer: Self-pay | Admitting: Internal Medicine

## 2009-11-13 ENCOUNTER — Ambulatory Visit: Payer: Self-pay | Admitting: Internal Medicine

## 2009-11-13 LAB — CONVERTED CEMR LAB: INR: 1.9

## 2009-12-05 ENCOUNTER — Ambulatory Visit: Payer: Self-pay | Admitting: Internal Medicine

## 2009-12-05 LAB — CONVERTED CEMR LAB
Bilirubin Urine: NEGATIVE
Glucose, Urine, Semiquant: NEGATIVE
INR: 2.5
Specific Gravity, Urine: 1.01
pH: 5

## 2009-12-06 ENCOUNTER — Encounter: Payer: Self-pay | Admitting: Internal Medicine

## 2009-12-06 ENCOUNTER — Telehealth (INDEPENDENT_AMBULATORY_CARE_PROVIDER_SITE_OTHER): Payer: Self-pay | Admitting: Pharmacist

## 2009-12-06 LAB — CONVERTED CEMR LAB
CO2: 27 meq/L (ref 19–32)
Calcium: 9.7 mg/dL (ref 8.4–10.5)
Creatinine, Ser: 1.11 mg/dL (ref 0.40–1.20)
Sodium: 137 meq/L (ref 135–145)

## 2009-12-11 ENCOUNTER — Ambulatory Visit: Payer: Self-pay | Admitting: Internal Medicine

## 2009-12-11 LAB — CONVERTED CEMR LAB

## 2009-12-12 ENCOUNTER — Encounter: Payer: Self-pay | Admitting: Internal Medicine

## 2010-01-01 ENCOUNTER — Telehealth: Payer: Self-pay | Admitting: *Deleted

## 2010-01-01 ENCOUNTER — Encounter: Payer: Self-pay | Admitting: Internal Medicine

## 2010-01-01 ENCOUNTER — Ambulatory Visit: Payer: Self-pay | Admitting: Internal Medicine

## 2010-01-01 LAB — CONVERTED CEMR LAB
BUN: 38 mg/dL — ABNORMAL HIGH (ref 6–23)
Calcium: 9.8 mg/dL (ref 8.4–10.5)
Calcium: 9.9 mg/dL (ref 8.4–10.5)
Glucose, Bld: 256 mg/dL — ABNORMAL HIGH (ref 70–99)
Glucose, Bld: 152 mg/dL — ABNORMAL HIGH (ref 70–99)
INR: 2.8
Potassium: 4.7 meq/L (ref 3.5–5.3)
Potassium: 5 meq/L (ref 3.5–5.3)
Sodium: 134 meq/L — ABNORMAL LOW (ref 135–145)
Sodium: 137 meq/L (ref 135–145)

## 2010-01-22 ENCOUNTER — Emergency Department (HOSPITAL_COMMUNITY): Admission: EM | Admit: 2010-01-22 | Discharge: 2010-01-22 | Payer: Self-pay | Admitting: Emergency Medicine

## 2010-01-24 ENCOUNTER — Telehealth: Payer: Self-pay | Admitting: *Deleted

## 2010-02-05 ENCOUNTER — Ambulatory Visit: Payer: Self-pay | Admitting: Internal Medicine

## 2010-02-05 ENCOUNTER — Telehealth: Payer: Self-pay | Admitting: Internal Medicine

## 2010-02-05 LAB — CONVERTED CEMR LAB: INR: 3

## 2010-02-08 ENCOUNTER — Telehealth: Payer: Self-pay | Admitting: *Deleted

## 2010-03-02 ENCOUNTER — Telehealth: Payer: Self-pay | Admitting: Internal Medicine

## 2010-03-05 ENCOUNTER — Encounter: Payer: Self-pay | Admitting: Pharmacist

## 2010-03-05 ENCOUNTER — Ambulatory Visit: Admission: RE | Admit: 2010-03-05 | Discharge: 2010-03-05 | Payer: Self-pay | Source: Home / Self Care

## 2010-03-05 LAB — CONVERTED CEMR LAB: INR: 1.9

## 2010-03-09 ENCOUNTER — Ambulatory Visit
Admission: RE | Admit: 2010-03-09 | Discharge: 2010-03-09 | Payer: Self-pay | Source: Home / Self Care | Attending: Internal Medicine | Admitting: Internal Medicine

## 2010-03-09 LAB — CONVERTED CEMR LAB
Bilirubin Urine: NEGATIVE
Casts: NONE SEEN /LPF
Crystals: NONE SEEN
Glucose, Urine, Semiquant: NEGATIVE
Ketones, ur: NEGATIVE mg/dL
Nitrite: POSITIVE — AB
Protein, U semiquant: NEGATIVE
Protein, ur: NEGATIVE mg/dL
Specific Gravity, Urine: 1.011
Squamous Epithelial / HPF: NONE SEEN /LPF
Urine Glucose: NEGATIVE mg/dL
Urobilinogen, UA: 0.2
pH: 5.5

## 2010-03-15 DIAGNOSIS — I4891 Unspecified atrial fibrillation: Secondary | ICD-10-CM

## 2010-03-15 DIAGNOSIS — Z7901 Long term (current) use of anticoagulants: Secondary | ICD-10-CM | POA: Insufficient documentation

## 2010-03-21 ENCOUNTER — Telehealth: Payer: Self-pay | Admitting: Internal Medicine

## 2010-03-27 NOTE — Assessment & Plan Note (Signed)
Summary: COU/CH  Anticoagulant Therapy Managed by: Barbera Setters. [name]  PharmD CACP Referring MD: Arnaldo Natal MD PCP: Ulyess Mort MD [facility] Attending: Josem Kaufmann MD, Lyman Bishop Indication 1: Atrial fibrillation Indication 2: Aftercare long term use Anticoagulants V58.61 Start date: 02/10/2002 Duration: Indefinite  Patient Assessment Reviewed by: Chancy Milroy PharmD  March 27, 2009 Medication review: verified warfarin dosage & schedule,verified previous prescription medications, verified doses & any changes, verified new medications, reviewed OTC medications, reviewed OTC health products-vitamins supplements etc Complications: none Dietary changes: none   Health status changes: none   Lifestyle changes: none   Recent/future hospitalizations: none   Recent/future procedures: none   Recent/future dental: none Patient Assessment Part 2:  Have you MISSED ANY DOSES or CHANGED TABLETS?  No missed Warfarin doses or changed tablets.  Have you had any BRUISING or BLEEDING ( nose or gum bleeds,blood in urine or stool)?  No reported bruising or bleeding in nose, gums, urine, stool.  Have you STARTED or STOPPED any MEDICATIONS, including OTC meds,herbals or supplements?  No other medications or herbal supplements were started or stopped.  Have you CHANGED your DIET, especially green vegetables,or ALCOHOL intake?  No changes in diet or alcohol intake.  Have you had any ILLNESSES or HOSPITALIZATIONS?  No reported illnesses or hospitalizations  Have you had any signs of CLOTTING?(chest discomfort,dizziness,shortness of breath,arms tingling,slurred speech,swelling or redness in leg)    No chest discomfort, dizziness, shortness of breath, tingling in arm, slurred speech, swelling, or redness in leg.     Treatment  Target INR: 2.0-2.5 INR: 4.3  Date: 03/27/2009 Regimen In:  30.0mg /week INR reflects regimen in: 4.3  New  Tablet strength: : 5mg  Regimen Out:     Sunday: 0 Tablet     Monday:  1 Tablet     Tuesday: 1 Tablet     Wednesday: 1/2 Tablet     Thursday: 1 Tablet      Friday: 1 Tablet     Saturday: 1 Tablet Total Weekly: 27.5mg /week mg  Next INR Due: 04/10/2009 Adjusted by: Barbera Setters. Alexandria Lodge III PharmD CACP   Return to anticoagulation clinic:  04/10/2009 Time of next visit: 0945  Hold:  1 Days     Allergies: No Known Drug Allergies

## 2010-03-27 NOTE — Assessment & Plan Note (Signed)
Summary: COU/APPT 9:45AM/VS  Anticoagulant Therapy Managed by: Barbera Setters. [name]  PharmD CACP Referring MD: Arnaldo Natal MD PCP: Ulyess Mort MD [facility] Attending: Coralee Pesa MD, Levada Schilling Indication 1: Atrial fibrillation Indication 2: Aftercare long term use Anticoagulants V58.61 Start date: 02/10/2002 Duration: Indefinite  Patient Assessment Reviewed by: Chancy Milroy PharmD  April 10, 2009 Medication review: verified warfarin dosage & schedule,verified previous prescription medications, verified doses & any changes, verified new medications, reviewed OTC medications, reviewed OTC health products-vitamins supplements etc Complications: none Dietary changes: none   Health status changes: none   Lifestyle changes: none   Recent/future hospitalizations: none   Recent/future procedures: none   Recent/future dental: none Patient Assessment Part 2:  Have you MISSED ANY DOSES or CHANGED TABLETS?  No missed Warfarin doses or changed tablets.  Have you had any BRUISING or BLEEDING ( nose or gum bleeds,blood in urine or stool)?  No reported bruising or bleeding in nose, gums, urine, stool.  Have you STARTED or STOPPED any MEDICATIONS, including OTC meds,herbals or supplements?  No other medications or herbal supplements were started or stopped.  Have you CHANGED your DIET, especially green vegetables,or ALCOHOL intake?  No changes in diet or alcohol intake.  Have you had any ILLNESSES or HOSPITALIZATIONS?  No reported illnesses or hospitalizations  Have you had any signs of CLOTTING?(chest discomfort,dizziness,shortness of breath,arms tingling,slurred speech,swelling or redness in leg)    No chest discomfort, dizziness, shortness of breath, tingling in arm, slurred speech, swelling, or redness in leg.     Treatment  Target INR: 2.0-2.5 INR: 4.3  Date: 03/27/2009 Regimen In:  27.5mg /week   Regimen Out:     Sunday: 1 Tablet     Monday: 0 Tablet     Tuesday: 0 Tablet  Thursday: 1/2 Tablet      Friday: 1/2 Tablet     Saturday: 1 Tablet Total Weekly: 25.0mg /week mg  Next INR Due: 04/17/2009 Adjusted by: Barbera Setters. Alexandria Lodge III PharmD CACP   Return to anticoagulation clinic:  04/17/2009 Time of next visit: 0930  Hold:  2 Days    Comments: OMIT/HOLD x 2 days; decrease to 25mg /wk--first 3 resummed doses will be 2.5mg  once daily warfarin, then--5mg  Saturday and Sunday--re-evaluate INR response on Monday 21-Feb-11.  Allergies: No Known Drug Allergies

## 2010-03-27 NOTE — Assessment & Plan Note (Addendum)
Summary: EST-CK/FU/MEDS/CFB   Vital Signs:  Patient profile:   77 year old female Height:      62 inches (157.48 cm) Weight:      302.7 pounds (137.59 kg) BMI:     55.56 Temp:     97.7 degrees F (36.50 degrees C) oral Pulse rate:   58 / minute BP sitting:   126 / 73  (left arm) Cuff size:   wrist  Vitals Entered By: Cynda Familia Duncan Dull) (December 05, 2009 2:20 PM) CC: flu vaccine, ?uti Is Patient Diabetic? No Pain Assessment Patient in pain? no      Nutritional Status BMI of > 30 = obese  Have you ever been in a relationship where you felt threatened, hurt or afraid?No   Does patient need assistance? Functional Status Cook/clean, Shopping Ambulation Impaired:Risk for fall, Wheelchair   Primary Care Provider:  Ulyess Mort MD  CC:  flu vaccine and ?uti.  History of Present Illness: 56 woman with COPD and recurrent UTIs.  Doing fairly well today.  Wgt up 10 lbs and she is massively obese.  Denies any increase in CHF symptoms.  She is surprised that she has gained this wgt.  No med non-adherence.  Depression History:      The patient denies a depressed mood most of the day and a diminished interest in her usual daily activities.         Preventive Screening-Counseling & Management  Alcohol-Tobacco     Smoking Status: quit     Year Quit: 1968  Allergies: No Known Drug Allergies  Physical Exam  Lungs:  distant sounds but no definite crackles or wheezes. Extremities:  no pitting edema   Impression & Recommendations:  Problem # 1:  DYSURIA (ICD-788.1)  UA positive.  Re-start bactrim.  Notify Chancy Milroy for warfarin interaction.   Her updated medication list for this problem includes:    Detrol La 2 Mg Xr24h-cap (Tolterodine tartrate) .Marland Kitchen... Take 1 capsule by mouth once a day    Sulfamethoxazole-trimethoprim 400-80 Mg Tabs (Sulfamethoxazole-trimethoprim) .Marland Kitchen... Take 1 tablet by mouth two times a day for 1 week.  Orders: T-Urinalysis Dipstick only  ([id number])  Problem # 2:  CONGESTIVE HEART FAILURE (ICD-428.0) no change in meds.  no lung findings on exam.  no new respiratory symptoms.   Her updated medication list for this problem includes:    Lasix 40 Mg Tabs (Furosemide) .Marland Kitchen... Take two tabs daily    Cozaar 50 Mg Tabs (Losartan potassium) .Marland Kitchen... Take 1 tablet by mouth twice a day    Warfarin Sodium 5 Mg Tabs (Warfarin sodium) .Marland Kitchen... Take as directed.    Spironolactone 25 Mg Tabs (Spironolactone) .Marland Kitchen... Take 1 tablet by mouth once a day.  Complete Medication List: 1)  Lipitor 40 Mg Tabs (Atorvastatin calcium) .... Take 1 tablet by mouth once a day 2)  Levothroid 175 Mcg Tabs (Levothyroxine sodium) .... Take 1 tablet by mouth once a day 3)  Proventil Hfa 108 (90 Base) Mcg/act Aers (Albuterol sulfate) .Marland Kitchen.. 1 to 2 puffs two times a day as needed for wheezing. 4)  Advair Diskus 250-50 Mcg/dose Misc (Fluticasone-salmeterol) .... Twice a day 5)  Spiriva Handihaler 18 Mcg Caps (Tiotropium bromide monohydrate) .... Once a day 6)  Singulair 10 Mg Tabs (Montelukast sodium) .... Take 1 tablet by mouth once a day 7)  Fluticasone Propionate 50 Mcg/act Susp (Fluticasone propionate) .Marland Kitchen.. 1 spray each nostril two times a day 8)  Lasix 40 Mg Tabs (Furosemide) .... Take two tabs  daily 9)  Cozaar 50 Mg Tabs (Losartan potassium) .... Take 1 tablet by mouth twice a day 10)  Warfarin Sodium 5 Mg Tabs (Warfarin sodium) .... Take as directed. 11)  Fluoxetine Hcl 20 Mg Caps (Fluoxetine hcl) .... Take 2 capsules by mouth once a day 12)  Detrol La 2 Mg Xr24h-cap (Tolterodine tartrate) .... Take 1 capsule by mouth once a day 13)  Omeprazole 20 Mg Cpdr (Omeprazole) .... Take 1 capsule by mouth two times a day 14)  Reglan 5 Mg Tabs (Metoclopramide hcl) .... Take 1 tablet by mouth three times a day before meals and at bedtime. 15)  Miralax Powd (Polyethylene glycol 3350) .... Use as directed 16)  Tylenol Extra Strength 500 Mg Tabs (Acetaminophen) .... Take 2 tablets  by mouth two times a day 17)  Amlodipine Besylate 5 Mg Tabs (Amlodipine besylate) .... Take 1 tablet by mouth once a day 18)  Sulfamethoxazole-trimethoprim 400-80 Mg Tabs (Sulfamethoxazole-trimethoprim) .... Take 1 tablet by mouth two times a day for 1 week. 19)  Spironolactone 25 Mg Tabs (Spironolactone) .... Take 1 tablet by mouth once a day.  Other Orders: T-Basic Metabolic Panel [phone]) T-Protime (in-house) [phone]) Flu Vaccine 65yrs + MEDICARE PATIENTS (O8416) Administration Flu vaccine - MCR (S0630)  Patient Instructions: 1)  Please schedule a follow-up appointment in 3 months. Prescriptions: SULFAMETHOXAZOLE-TRIMETHOPRIM 400-80 MG TABS (SULFAMETHOXAZOLE-TRIMETHOPRIM) Take 1 tablet by mouth two times a day for 1 week.  #14 x 0   Entered and Authorized by:   Ulyess Mort MD   Signed by:   Ulyess Mort MD on 12/05/2009   Method used:   Electronically to        [facility] [phone]* (retail)       [address]       [place], [place]  [address]       Ph: [phone]       Fax: [phone]   RxID:   [id number]  Process Orders Check Orders Results:     [facility]: Check successful Tests Sent for requisitioning (December 05, 2009 5:11 PM):     12/05/2009: [facility] -- T-Basic Metabolic Panel [phone] (signed)     Prevention & Chronic Care Immunizations   Influenza vaccine: Fluvax 3+  (12/05/2009)   Influenza vaccine deferral: Not indicated  (08/25/2008)    Tetanus booster: Not documented   Td booster deferral: Deferred  (08/25/2008)    Pneumococcal vaccine: Not documented   Pneumococcal vaccine deferral: Deferred  (08/25/2008)    H. zoster vaccine: Not documented   H. zoster vaccine deferral: Deferred  (08/25/2008)  Colorectal Screening   Hemoccult: Not documented   Hemoccult action/deferral: Not indicated  (05/19/2009)    Colonoscopy: Results: Polyp.  Location:  [facility].    (05/27/2001)    Colonoscopy action/deferral: Not indicated  (05/19/2009)   Colonoscopy due: 05/28/2006  Other Screening   Pap smear: Not documented   Pap smear action/deferral: Not indicated-other  (08/25/2008)    Mammogram: Not documented    DXA bone density scan: Not documented   DXA bone density action/deferral: Deferred  (08/25/2008)   Smoking status: quit  (12/05/2009)  Diabetes Mellitus   HgbA1C: 6.2  (03/10/2009)    Eye exam: Not documented    Foot exam: Not documented   High risk foot: Not documented   Foot care education: Not documented    Urine microalbumin/creatinine ratio: 48.0  (05/20/2009)   Urine microalbumin action/deferral: Ordered  Lipids   Total Cholesterol: 131  (  06/22/2007)   Lipid panel action/deferral: Not indicated   LDL: 107  --  01/23/2008  (03/08/2008)   LDL Direct: Not documented   HDL: 51  (06/22/2007)   Triglycerides: 86  (06/22/2007)    SGOT (AST): 26  (08/25/2008)   BMP action: Not indicated   SGPT (ALT): 16  (08/25/2008)   Alkaline phosphatase: 128  (08/25/2008)   Total bilirubin: 0.5  (08/25/2008)  Hypertension   Last Blood Pressure: 126 / 73  (12/05/2009)   Serum creatinine: 0.93  (07/18/2009)   Serum potassium 4.5  (07/18/2009)  Self-Management Support :   Personal Goals (by the next clinic visit) :      Personal blood pressure goal: 130/80  (07/18/2009)     Personal LDL goal: 100  (07/18/2009)    Diabetes self-management support: Pre-printed educational material, Resources for patients handout  (07/18/2009)    Hypertension self-management support: Pre-printed educational material, Resources for patients handout  (07/18/2009)    Lipid self-management support: Pre-printed educational material, Resources for patients handout  (07/18/2009)    Nursing Instructions: Give Flu vaccine today    Laboratory Results   Urine Tests  Date/Time Received: .Cynda Familia [facility])  December 05, 2009 2:37 PM  Date/Time Reported: Cynda Familia [facility])  December 05, 2009 2:37 PM   Routine Urinalysis   Color: lt. yellow Glucose: negative   (Normal Range: Negative) Bilirubin: negative   (Normal Range: Negative) Ketone: negative   (Normal Range: Negative) Spec. Gravity: 1.010   (Normal Range: 1.003-1.035) Blood: negative   (Normal Range: Negative) pH: 5.0   (Normal Range: 5.0-8.0) Protein: negative   (Normal Range: Negative) Urobilinogen: 0.2   (Normal Range: 0-1) Nitrite: positive   (Normal Range: Negative) Leukocyte Esterace: trace   (Normal Range: Negative)     Blood Tests      INR: 2.5   (Normal Range: 0.88-1.12   Therap INR: 2.0-3.5)    Process Orders Check Orders Results:     [facility]: Check successful Tests Sent for requisitioning (December 05, 2009 5:11 PM):     12/05/2009: [facility] -- T-Basic Metabolic Panel [phone] (signed)    Laboratory Results   Urine Tests    Routine Urinalysis   Color: lt. yellow Glucose: negative   (Normal Range: Negative) Bilirubin: negative   (Normal Range: Negative) Ketone: negative   (Normal Range: Negative) Spec. Gravity: 1.010   (Normal Range: 1.003-1.035) Blood: negative   (Normal Range: Negative) pH: 5.0   (Normal Range: 5.0-8.0) Protein: negative   (Normal Range: Negative) Urobilinogen: 0.2   (Normal Range: 0-1) Nitrite: positive   (Normal Range: Negative) Leukocyte Esterace: trace   (Normal Range: Negative)     Blood Tests      INR: 2.5   (Normal Range: 0.88-1.12   Therap INR: 2.0-3.5)   Flu Vaccine Consent Questions     Do you have a history of severe allergic reactions to this vaccine? no    Any prior history of allergic reactions to egg and/or gelatin? no    Do you have a sensitivity to the preservative Thimersol? no    Do you have a past history of Guillan-Barre Syndrome? no    Do you currently have an acute febrile illness? no    Have you ever had a severe reaction to latex? no    Vaccine  information given and explained to patient? yes    Are you currently pregnant? no    Lot Number:[id number]   Exp Date:08/25/2010   Manufacturer: [employer]  Site Given  right Deltoid IM.Cynda Familia ([facility])  December 05, 2009 4:00 PM   INR: 2.5   (Normal Range: 0.88-1.12   Therap INR: 2.0-3.5)   .medflu  Appended Document: EST-CK/FU/MEDS/CFB Made aware of patient being commenced upon SMX-TMP. Have called the patient and modified her warfarin regimen accordingly to:  Dosage Out: Su-2.5mg M-5mg T-5mg W-2.5mg Th-5mg F-2.5mg Sa-5mg  (27.5mg /wk) DOWN from 35mg /wk given the known-known drug interaction (warfarin with SMX-TMP). Patient will be on SMX-TMP for 1 week. Will re-evaluate INR response on SMX-TMP (and with subsequent dose reduction) on Monday 17-Oct-11. Patient called and apprised of same.

## 2010-03-27 NOTE — Assessment & Plan Note (Signed)
Summary: COU/ SB.  Anticoagulant Therapy Managed by: Barbera Setters. [name]  PharmD CACP Referring MD: Arnaldo Natal MD PCP: Ulyess Mort MD [facility] Attending: Coralee Pesa MD, Levada Schilling Indication 1: Atrial fibrillation Indication 2: Aftercare long term use Anticoagulants V58.61 Start date: 02/10/2002 Duration: Indefinite  Patient Assessment Reviewed by: Chancy Milroy PharmD  August 21, 2009 Medication review: verified warfarin dosage & schedule,verified previous prescription medications, verified doses & any changes, verified new medications, reviewed OTC medications, reviewed OTC health products-vitamins supplements etc Complications: none Dietary changes: none   Health status changes: none   Lifestyle changes: none   Recent/future hospitalizations: none   Recent/future procedures: none   Recent/future dental: none Patient Assessment Part 2:  Have you MISSED ANY DOSES or CHANGED TABLETS?  No missed Warfarin doses or changed tablets.  Have you had any BRUISING or BLEEDING ( nose or gum bleeds,blood in urine or stool)?  No reported bruising or bleeding in nose, gums, urine, stool.  Have you STARTED or STOPPED any MEDICATIONS, including OTC meds,herbals or supplements?  YES. Started SMX-TMP last week.  Have you CHANGED your DIET, especially green vegetables,or ALCOHOL intake?  No changes in diet or alcohol intake.  Have you had any ILLNESSES or HOSPITALIZATIONS?  No reported illnesses or hospitalizations  Have you had any signs of CLOTTING?(chest discomfort,dizziness,shortness of breath,arms tingling,slurred speech,swelling or redness in leg)    No chest discomfort, dizziness, shortness of breath, tingling in arm, slurred speech, swelling, or redness in leg.     Treatment  Target INR: 2.0-2.5 INR: 2.0  Date: 08/21/2009 Regimen In:  32.5mg /week INR reflects regimen in: 2.0  New  Tablet strength: : 5mg  Regimen Out:     Sunday: 1 Tablet     Monday: 1 Tablet     Tuesday: 1 Tablet    Wednesday: 1/2 Tablet     Thursday: 1 Tablet      Friday: 1 Tablet     Saturday: 1 Tablet Total Weekly: 32.5mg /week mg  Next INR Due: 09/04/2009 Adjusted by: Barbera Setters. Alexandria Lodge III PharmD CACP   Return to anticoagulation clinic:  09/04/2009 Time of next visit: 0900    Allergies: No Known Drug Allergies  Appended Document: COU/ SB. Dosage Out: Su-5mg M-5mg T-5mg W-5mg Th-2.5mg F-5mg Sa-2.5mg  pot. serious interacting antibiotic started >= 4 days Will make her weekly total dose 30mg /wk.

## 2010-03-27 NOTE — Assessment & Plan Note (Signed)
Summary: EST-1 MONTH F/U VISIT/CH   Vital Signs:  Patient profile:   77 year old [name] Height:      62 inches (157.48 cm) Weight:      290.0 pounds (131.82 kg) BMI:     53.23 Temp:     98.6 degrees F oral Pulse rate:   50 / minute BP sitting:   171 / 80  (right arm)  Vitals Entered By: Cynda Familia Duncan Dull) (August 15, 2009 2:56 PM)  Does patient need assistance? Functional Status Cook/clean, Shopping, Social activities Ambulation Impaired:Risk for fall, Wheelchair Comments arrived via wheelchari   Primary Care Provider:  Ulyess Mort MD   History of Present Illness: 89 woman with a fib and chf.  Has had large weight gain over past year with orthopnea.   Baseline wgt was 273 up thru Jan. 2011 and rose to 292 in May.  This despite steady increases in lasix dose over past year.  No clear cardiac event.  Creatinine has remained normal.  Albumin is 3.0..  She has morbid obesity and is absolutely sedentary, but this has not obviously changed. She has lost only 2 lbs since last month when I doubled her lasix.  She denies orthopnea now and says dyspnea is somewhat better.  Allergies: No Known Drug Allergies   Impression & Recommendations:  Problem # 1:  CONGESTIVE HEART FAILURE (ICD-428.0) Will add spironolactone to mix.  Discussed avoidance of salty foods.   Her updated medication list for this problem includes:    Lasix 40 Mg Tabs (Furosemide) .Marland Kitchen... Take two tabs daily    Cozaar 50 Mg Tabs (Losartan potassium) .Marland Kitchen... Take 1 tablet by mouth twice a day    Warfarin Sodium 5 Mg Tabs (Warfarin sodium) .Marland Kitchen... Take as directed.    Spironolactone 25 Mg Tabs (Spironolactone) .Marland Kitchen... Take 1 tablet by mouth once a day.  Problem # 2:  DYSURIA (ICD-788.1) Symptom active today.  UA positive for LE and nitrites. Bactrim DS two times a day for 1 week. Notify Dr. Alexandria Lodge re: warfarin.  Her updated medication list for this problem includes:    Detrol La 2 Mg Xr24h-cap (Tolterodine tartrate)  .Marland Kitchen... Take 1 capsule by mouth once a day    Sulfamethoxazole-trimethoprim 400-80 Mg Tabs (Sulfamethoxazole-trimethoprim) .Marland Kitchen... Take 1 tablet by mouth two times a day for 1 week.  Orders: T-Urinalysis Dipstick only ([id number])  Complete Medication List: 1)  Lipitor 40 Mg Tabs (Atorvastatin calcium) .... Take 1 tablet by mouth once a day 2)  Fish Oil 300 Mg Caps (Omega-3 fatty acids) .... Take 3 tabs by mouth daily. 3)  Levothroid 175 Mcg Tabs (Levothyroxine sodium) .... Take 1 tablet by mouth once a day 4)  Proventil Hfa 108 (90 Base) Mcg/act Aers (Albuterol sulfate) .Marland Kitchen.. 1 to 2 puffs two times a day as needed for wheezing. 5)  Advair Diskus 250-50 Mcg/dose Misc (Fluticasone-salmeterol) .... Twice a day 6)  Spiriva Handihaler 18 Mcg Caps (Tiotropium bromide monohydrate) .... Once a day 7)  Singulair 10 Mg Tabs (Montelukast sodium) .... Take 1 tablet by mouth once a day 8)  Fluticasone Propionate 50 Mcg/act Susp (Fluticasone propionate) .Marland Kitchen.. 1 spray each nostril two times a day 9)  Benadryl 25 Mg Tabs (Diphenhydramine hcl) .... Take 1 tablet by mouth at bedtime 10)  Lasix 40 Mg Tabs (Furosemide) .... Take two tabs daily 11)  Cozaar 50 Mg Tabs (Losartan potassium) .... Take 1 tablet by mouth twice a day 12)  Warfarin Sodium 5 Mg Tabs (Warfarin sodium) .Marland KitchenMarland KitchenMarland Kitchen  Take as directed. 13)  Fluoxetine Hcl 20 Mg Caps (Fluoxetine hcl) .... Take 2 capsules by mouth once a day 14)  Detrol La 2 Mg Xr24h-cap (Tolterodine tartrate) .... Take 1 capsule by mouth once a day 15)  Omeprazole 20 Mg Cpdr (Omeprazole) .... Take 1 capsule by mouth two times a day 16)  Reglan 5 Mg Tabs (Metoclopramide hcl) .... Take 1 tablet by mouth three times a day before meals and at bedtime. 17)  Miralax Powd (Polyethylene glycol 3350) .... Use as directed 18)  Tylenol Extra Strength 500 Mg Tabs (Acetaminophen) .... Take 2 tablets by mouth two times a day 19)  Colchicine 0.6 Mg Tabs (Colchicine) .... As directed 20)  Amlodipine  Besylate 5 Mg Tabs (Amlodipine besylate) .... Take 1 tablet by mouth once a day 21)  Sulfamethoxazole-trimethoprim 400-80 Mg Tabs (Sulfamethoxazole-trimethoprim) .... Take 1 tablet by mouth two times a day for 1 week. 22)  Spironolactone 25 Mg Tabs (Spironolactone) .... Take 1 tablet by mouth once a day.  Other Orders: T-Protime (in-house) [phone])  Patient Instructions: 1)  Please schedule a follow-up appointment in 2 months. Prescriptions: SPIRONOLACTONE 25 MG TABS (SPIRONOLACTONE) Take 1 tablet by mouth once a day.  #31 x 11   Entered and Authorized by:   Ulyess Mort MD   Signed by:   Ulyess Mort MD on 08/15/2009   Method used:   Electronically to        [facility] [phone]* (retail)       [name]       [place], [place]  [address]       Ph: [phone]       Fax: [phone]   RxID:   [id number] SULFAMETHOXAZOLE-TRIMETHOPRIM 400-80 MG TABS (SULFAMETHOXAZOLE-TRIMETHOPRIM) Take 1 tablet by mouth two times a day for 1 week.  #14 x 0   Entered and Authorized by:   Ulyess Mort MD   Signed by:   Ulyess Mort MD on 08/15/2009   Method used:   Electronically to        [facility] [phone]* (retail)       [address]       [place], [place]  [address]       Ph: [phone]       Fax: [phone]   RxID:   [id number]   Laboratory Results   Urine Tests  Date/Time Received: .Cynda Familia [facility])  August 15, 2009 3:15 PM  Date/Time Reported: Cynda Familia [facility])  August 15, 2009 3:15 PM   Routine Urinalysis   Color: yellow Appearance: Cloudy Glucose: negative   (Normal Range: Negative) Bilirubin: negative   (Normal Range: Negative) Ketone: negative   (Normal Range: Negative) Spec. Gravity: 1.015   (Normal Range: 1.003-1.035) Blood: negative   (Normal Range: Negative) pH: 5.0   (Normal Range: 5.0-8.0) Protein: negative   (Normal Range: Negative) Urobilinogen: 0.2   (Normal Range: 0-1) Nitrite: negative   (Normal Range:  Negative) Leukocyte Esterace: small   (Normal Range: Negative)     Blood Tests   Date/Time Received: August 15, 2009 3:57 PM Date/Time Reported: Alric Quan  August 15, 2009 3:57 PM    INR: 2.8   (Normal Range: 0.88-1.12   Therap INR: 2.0-3.5)     Laboratory Results   Urine Tests    Routine Urinalysis   Color: yellow Appearance: Cloudy Glucose: negative   (Normal Range: Negative) Bilirubin: negative   (Normal Range: Negative) Ketone: negative   (Normal Range: Negative) Spec. Gravity: 1.015   (  Normal Range: 1.003-1.035) Blood: negative   (Normal Range: Negative) pH: 5.0   (Normal Range: 5.0-8.0) Protein: negative   (Normal Range: Negative) Urobilinogen: 0.2   (Normal Range: 0-1) Nitrite: negative   (Normal Range: Negative) Leukocyte Esterace: small   (Normal Range: Negative)     Blood Tests      INR: 2.8   (Normal Range: 0.88-1.12   Therap INR: 2.0-3.5)

## 2010-03-27 NOTE — Progress Notes (Signed)
  Phone Note From Other Clinic   Caller: Nurse Call For: Hulen Luster PharmD CACP Reason for Call: Schedule Patient Appt, Medication Check, Diagnosis Check Request: Talk with Nurse, Talk with Rondy Krupinski, Call Patient Action Taken: Phone Call Completed, [name] Notified, Patient called Details for Reason: [name] from clinic apprised me that in an office visit dated 11-Oct-11 that the patient was commenced upon SMX-TMP (I have reviewed [name] note of Dr. Aundria Rud for this visit) Patient to be on SMX-TMP for 7 days.  Details of Complaint: Dysuria. Placed on SMX-TMP.  Details of Action Taken: I have called patient and advised to reduce weekly total dose warfarin from 35mg /wk to 27.5mg /wk commencing today 12-Oct-11 by taking 1/2 x 5mg  (2.5mg ) on Wed/Fri/Sun; will take 1x5mg  on Th/Sa. INR next Monday 17-Oct-11. Summary of Call: Telemanagement of warfarin after commencing SMX-TMP. Initial call taken by: Hulen Luster PharmD CACP

## 2010-03-27 NOTE — Miscellaneous (Signed)
Summary: [facility]: CMN  [facility]: CMN   Imported By: Florinda Marker 05/22/2009 10:30:05  _____________________________________________________________________  External Attachment:    Type:   Image     Comment:   External Document

## 2010-03-27 NOTE — Assessment & Plan Note (Signed)
Summary: Coumadin Clinic  Anticoagulant Therapy Managed by: Barbera Setters. [name]  PharmD CACP Referring MD: Arnaldo Natal MD PCP: Ulyess Mort MD [facility] Attending: Lowella Bandy MD Indication 1: Atrial fibrillation Indication 2: Aftercare long term use Anticoagulants V58.61 Start date: 02/10/2002 Duration: Indefinite  Patient Assessment Reviewed by: Chancy Milroy PharmD  April 27, 2009 Medication review: verified warfarin dosage & schedule,verified previous prescription medications, verified doses & any changes, verified new medications, reviewed OTC medications, reviewed OTC health products-vitamins supplements etc Complications: none Dietary changes: none   Health status changes: yes  Details: YES. States she believes she may have a UTI. She is seeing a PCP today in the [facility] while here. Lifestyle changes: none   Recent/future hospitalizations: none   Recent/future procedures: none   Recent/future dental: none Patient Assessment Part 2:  Have you MISSED ANY DOSES or CHANGED TABLETS?  No missed Warfarin doses or changed tablets.  Have you had any BRUISING or BLEEDING ( nose or gum bleeds,blood in urine or stool)?  No reported bruising or bleeding in nose, gums, urine, stool.  Have you STARTED or STOPPED any MEDICATIONS, including OTC meds,herbals or supplements?  No other medications or herbal supplements were started or stopped.  Have you CHANGED your DIET, especially green vegetables,or ALCOHOL intake?  No changes in diet or alcohol intake.  Have you had any ILLNESSES or HOSPITALIZATIONS?  YES. She is here today to be seen by PCP for suspicion of UTI.  Have you had any signs of CLOTTING?(chest discomfort,dizziness,shortness of breath,arms tingling,slurred speech,swelling or redness in leg)    No chest discomfort, dizziness, shortness of breath, tingling in arm, slurred speech, swelling, or redness in leg.     Treatment  Target INR: 2.0-2.5 INR: 1.4  Date: 04/27/2009 Regimen  In:  30.0mg /week INR reflects regimen in: 1.4  New  Tablet strength: : 5mg  Regimen Out:     Sunday: 1 Tablet     Monday: 1 Tablet     Tuesday: 1 Tablet     Wednesday: 1 Tablet     Thursday: 1 Tablet      Friday: 1 Tablet     Saturday: 1 Tablet Total Weekly: 35.0mg /week mg  Next INR Due: 05/22/2009 Adjusted by: Barbera Setters. Navin Dogan III PharmD CACP   Return to anticoagulation clinic:  05/22/2009 Time of next visit: 0945   Comments: IF she is commenced upon an antibiotic for todays clinical presenation--please advise by paging me [phone] and I will adjust dose of warfarin or next scheduled visit as needed.  Allergies: No Known Drug Allergies  Appended Document: Coumadin Clinic Discussed with Dr. Tobie Lords. Patient is being commenced upon oral fluroquinolone known to interact with warfarin. In face/setting of subtherapeutic INR today (1.4)--will still give 5mg  warfarion by mouth once daily but will re-check INR in 4 days. Have discussed with patient and amended her warfarin discharge instruction sheet to reflect this appointment for Monday 7-Mar-11 at 0945h.

## 2010-03-27 NOTE — Assessment & Plan Note (Signed)
Summary: COU/CH  Anticoagulant Therapy Managed by: Barbera Setters. [name]  PharmD CACP Referring MD: Arnaldo Natal MD PCP: Ulyess Mort MD [facility] Attending: Blanch Media MD Indication 1: Atrial fibrillation Indication 2: Aftercare long term use Anticoagulants V58.61 Start date: 02/10/2002 Duration: Indefinite  Patient Assessment Reviewed by: Chancy Milroy PharmD  December 11, 2009 Medication review: verified warfarin dosage & schedule,verified previous prescription medications, verified doses & any changes, verified new medications, reviewed OTC medications, reviewed OTC health products-vitamins supplements etc Complications: none Dietary changes: none   Health status changes: none   Lifestyle changes: none   Recent/future hospitalizations: none   Recent/future procedures: none   Recent/future dental: none Patient Assessment Part 2:  Have you MISSED ANY DOSES or CHANGED TABLETS?  No missed Warfarin doses or changed tablets.  Have you had any BRUISING or BLEEDING ( nose or gum bleeds,blood in urine or stool)?  No reported bruising or bleeding in nose, gums, urine, stool.  Have you STARTED or STOPPED any MEDICATIONS, including OTC meds,herbals or supplements?  No other medications or herbal supplements were started or stopped.  Have you CHANGED your DIET, especially green vegetables,or ALCOHOL intake?  No changes in diet or alcohol intake.  Have you had any ILLNESSES or HOSPITALIZATIONS?  No reported illnesses or hospitalizations  Have you had any signs of CLOTTING?(chest discomfort,dizziness,shortness of breath,arms tingling,slurred speech,swelling or redness in leg)    No chest discomfort, dizziness, shortness of breath, tingling in arm, slurred speech, swelling, or redness in leg.     Treatment  Target INR: 2.0-2.5 INR: 1.9  Date: 12/11/2009 Regimen In:  35.0mg /week INR reflects regimen in: 1.9  New  Tablet strength: : 5mg  Regimen Out:     Sunday: 1 Tablet  Monday: 1 Tablet     Tuesday: 1 Tablet     Wednesday: 1/2 Tablet     Thursday: 1 Tablet      Friday: 1 Tablet     Saturday: 1 Tablet Total Weekly: 32.5mg /week mg  Next INR Due: 12/25/2009 Adjusted by: Barbera Setters. Theoren Palka III PharmD CACP   Return to anticoagulation clinic:  12/25/2009 Time of next visit: 1000   Comments: Per Dr. Aundria Rud last note dated 11-Oct-11 she is to have been on SMX-TMP for 1 week--which would find her ending SMX-TMP tommorrow 18-Oct-11. Will increase back her warfarin to 32.5mg /wk and repeat INR in 2 weeks.  Allergies: No Known Drug Allergies

## 2010-03-27 NOTE — Assessment & Plan Note (Signed)
Summary: COU/APPT 9:30AM/VS  Anticoagulant Therapy Managed by: Barbera Setters. [name]  PharmD CACP Referring MD: Arnaldo Natal MD PCP: Ulyess Mort MD [facility] Attending: Josem Kaufmann MD, Lyman Bishop Indication 1: Atrial fibrillation Indication 2: Aftercare long term use Anticoagulants V58.61 Start date: 02/10/2002 Duration: Indefinite  Patient Assessment Reviewed by: Chancy Milroy PharmD  April 17, 2009 Medication review: verified warfarin dosage & schedule,verified previous prescription medications, verified doses & any changes, verified new medications, reviewed OTC medications, reviewed OTC health products-vitamins supplements etc Complications: none Dietary changes: none   Health status changes: none   Lifestyle changes: none   Recent/future hospitalizations: none   Recent/future procedures: none   Recent/future dental: none Patient Assessment Part 2:  Have you MISSED ANY DOSES or CHANGED TABLETS?  No missed Warfarin doses or changed tablets.  Have you had any BRUISING or BLEEDING ( nose or gum bleeds,blood in urine or stool)?  No reported bruising or bleeding in nose, gums, urine, stool.  Have you STARTED or STOPPED any MEDICATIONS, including OTC meds,herbals or supplements?  No other medications or herbal supplements were started or stopped.  Have you CHANGED your DIET, especially green vegetables,or ALCOHOL intake?  No changes in diet or alcohol intake.  Have you had any ILLNESSES or HOSPITALIZATIONS?  No reported illnesses or hospitalizations  Have you had any signs of CLOTTING?(chest discomfort,dizziness,shortness of breath,arms tingling,slurred speech,swelling or redness in leg)    No chest discomfort, dizziness, shortness of breath, tingling in arm, slurred speech, swelling, or redness in leg.     Treatment  Target INR: 2.0-2.5 INR: 2.0  Date: 04/17/2009 Regimen In:  25.0mg /week INR reflects regimen in: 2.0  New  Tablet strength: : 5mg  Regimen Out:     Sunday: 1  Tablet     Monday: 1 Tablet     Tuesday: 1 Tablet     Wednesday: 1/2 Tablet     Thursday: 1 Tablet      Friday: 1/2 Tablet     Saturday: 1 Tablet Total Weekly: 30.0mg /week mg  Next INR Due: 05/08/2009 Adjusted by: Barbera Setters. Alexandria Lodge III PharmD CACP   Return to anticoagulation clinic:  05/08/2009 Time of next visit: 0900    Allergies: No Known Drug Allergies

## 2010-03-27 NOTE — Progress Notes (Signed)
  Phone Note From Other Clinic   Caller: Warwick Bing Call For: Hulen Luster PharmD CACP Reason for Call: Schedule Patient Appt, Patient [facility], Medication Check, Diagnosis Check Request: Talk with Provider, Call Patient Action Taken: Phone Call Completed, Provider Notified Details for Reason: Patient seen this past Friday in [facility] by PCP. Details of Complaint: INR = 2.3 with no documented problems. Details of Action Taken: Patient was called and instructed to continue same regimen. Summary of Call: INR 2.3 Will continue same regimen. RTC in 3 weeks. Initial call taken by: Hulen Luster PharmD

## 2010-03-27 NOTE — Assessment & Plan Note (Signed)
Summary: COUMADIN/SB.  Anticoagulant Therapy Managed by: Barbera Setters. [name]  PharmD CACP Referring MD: Arnaldo Natal MD PCP: Ulyess Mort MD [facility] Attending: Darl Pikes, Beth Indication 1: Atrial fibrillation Indication 2: Aftercare long term use Anticoagulants V58.61 Start date: 02/10/2002 Duration: Indefinite  Patient Assessment Reviewed by: Chancy Milroy PharmD  January 01, 2010 Medication review: verified warfarin dosage & schedule,verified previous prescription medications, verified doses & any changes, verified new medications, reviewed OTC medications, reviewed OTC health products-vitamins supplements etc Complications: none Dietary changes: none   Health status changes: none   Lifestyle changes: none   Recent/future hospitalizations: none   Recent/future procedures: none   Recent/future dental: none Patient Assessment Part 2:  Have you MISSED ANY DOSES or CHANGED TABLETS?  No missed Warfarin doses or changed tablets.  Have you had any BRUISING or BLEEDING ( nose or gum bleeds,blood in urine or stool)?  No reported bruising or bleeding in nose, gums, urine, stool.  Have you STARTED or STOPPED any MEDICATIONS, including OTC meds,herbals or supplements?  No other medications or herbal supplements were started or stopped.  Have you CHANGED your DIET, especially green vegetables,or ALCOHOL intake?  No changes in diet or alcohol intake.  Have you had any ILLNESSES or HOSPITALIZATIONS?  No reported illnesses or hospitalizations  Have you had any signs of CLOTTING?(chest discomfort,dizziness,shortness of breath,arms tingling,slurred speech,swelling or redness in leg)    No chest discomfort, dizziness, shortness of breath, tingling in arm, slurred speech, swelling, or redness in leg.     Treatment  Target INR: 2.0-2.5 INR: 2.8  Date: 01/01/2010 Regimen In:  32.5mg /week INR reflects regimen in: 2.8  New  Tablet strength: : 5mg  Regimen Out:     Sunday: 1 Tablet  Monday: 1 Tablet     Tuesday: 1 Tablet     Wednesday: 0 Tablet     Thursday: 1 Tablet      Friday: 1 Tablet     Saturday: 1 Tablet Total Weekly: 30.0mg /week mg  Next INR Due: 01/29/2010 Adjusted by: Barbera Setters. Alexandria Lodge III PharmD CACP   Return to anticoagulation clinic:  01/29/2010 Time of next visit: 0900    Allergies: No Known Drug Allergies

## 2010-03-27 NOTE — Progress Notes (Signed)
Summary: Colchicine rx//kg  ---- Converted from flag ---- ---- 09/11/2009 3:41 PM, Ulyess Mort MD wrote: Call Cathlean Marseilles and tell her she no longer needs colchicine.  Will discuss this with her at her next visit. ------------------------------  Phone Note Outgoing Call   Call placed by: Cynda Familia Duncan Dull),  September 13, 2009 3:53 PM Call placed to: Patient Summary of Call: Pt contacted by phone and informed her that she no longer needs the colchicine as stated by DrRogers.   Pt disagrees with this decision and has asked if md would reconsider because she knows her toes better than anyone else.  Pt also states that her feet (bilateral) feels "achy" and would like to continue medicine.  Does not feel a visit is warranted just to see if she can continue med.   Will forward to Dr Aundria Rud for review.Cynda Familia [facility])  September 13, 2009 4:13 PM     Prescriptions: COLCHICINE 0.6 MG TABS (COLCHICINE) as directed  #60 x 5   Entered and Authorized by:   Ulyess Mort MD   Signed by:   Ulyess Mort MD on 09/14/2009   Method used:   Electronically to        [facility] [phone]* (retail)       [address]       [place], [place]  [address]       Ph: [phone]       Fax: [phone]   RxID:   [id number]

## 2010-03-27 NOTE — Miscellaneous (Signed)
  Clinical Lists Changes  Medications: Removed medication of SULFAMETHOXAZOLE-TRIMETHOPRIM 400-80 MG TABS (SULFAMETHOXAZOLE-TRIMETHOPRIM) Take 1 tablet by mouth two times a day for 1 week.

## 2010-03-27 NOTE — Assessment & Plan Note (Signed)
Summary: F/U/VS   Vital Signs:  Patient profile:   77 year old female Height:      62 inches (157.48 cm) Weight:      292.2 pounds (132.82 kg) BMI:     53.64 Temp:     97.6 degrees F (36.44 degrees C) oral BP sitting:   158 / 76  (right arm) Cuff size:   left wrist  Vitals Entered By: Cynda Familia Duncan Dull) (Jul 18, 2009 1:25 PM) CC: routine f/u, requests INR to be drawn today and forwarded to Dr Alexandria Lodge, c/o swelling in left knee/leg Is Patient Diabetic? No Nutritional Status BMI of > 30 = obese  Have you ever been in a relationship where you felt threatened, hurt or afraid?No   Does patient need assistance? Functional Status Cook/clean, Shopping Ambulation Impaired:Risk for fall, Wheelchair Comments arrived via wheelchair and walker   Primary Care Provider:  Ulyess Mort MD  CC:  routine f/u, requests INR to be drawn today and forwarded to Dr Alexandria Lodge, and c/o swelling in left knee/leg.  History of Present Illness: 70 woman with COPD and CHF.  Orthopnea is worse; up on 2 pillows.  Wgt up 11 lbs.  BP mildly up.  No chest pain or resp symptoms other than orthopnea.  Had non-suspicious fall at home several days ago.  Struck left thigh.  Swelling and bruise have been receding.  No evidence for serious bleed or infection.   Depression History:      The patient denies a depressed mood most of the day and a diminished interest in her usual daily activities.         Preventive Screening-Counseling & Management  Alcohol-Tobacco     Smoking Status: quit     Year Quit: 1968  Allergies: No Known Drug Allergies  Physical Exam  Lungs:  distant sounds.  No wheezing or audible rales. Heart:  very distant Extremities:  2+ left pedal edema and 2+ right pedal edema.     Impression & Recommendations:  Problem # 1:  CONGESTIVE HEART FAILURE (ICD-428.0)  Will increase lasix to 80 once daily.  Due to problems with frequency and incontinence,  will have her take it at one  time. She is instructed to observe her dyspnea and report sooner if it worsens or fails to improve.   Her updated medication list for this problem includes:    Lasix 40 Mg Tabs (Furosemide) .Marland Kitchen... Take two tabs daily    Cozaar 50 Mg Tabs (Losartan potassium) .Marland Kitchen... Take 1 tablet by mouth twice a day    Warfarin Sodium 5 Mg Tabs (Warfarin sodium) .Marland Kitchen... Take as directed.  Orders: T-Basic Metabolic Panel [id number])  Problem # 2:  HYPERTENSION (ICD-401.9) The increased lasix is the first step.  May still need more amlodipine.   Her updated medication list for this problem includes:    Lasix 40 Mg Tabs (Furosemide) .Marland Kitchen... Take two tabs daily    Cozaar 50 Mg Tabs (Losartan potassium) .Marland Kitchen... Take 1 tablet by mouth twice a day    Amlodipine Besylate 5 Mg Tabs (Amlodipine besylate) .Marland Kitchen... Take 1 tablet by mouth once a day  Complete Medication List: 1)  Lipitor 40 Mg Tabs (Atorvastatin calcium) .... Take 1 tablet by mouth once a day 2)  Fish Oil 300 Mg Caps (Omega-3 fatty acids) .... Take 3 tabs by mouth daily. 3)  Levothroid 175 Mcg Tabs (Levothyroxine sodium) .... Take 1 tablet by mouth once a day 4)  Proventil Hfa 108 (90 Base) Mcg/act  Aers (Albuterol sulfate) .Marland Kitchen.. 1 to 2 puffs two times a day as needed for wheezing. 5)  Advair Diskus 250-50 Mcg/dose Misc (Fluticasone-salmeterol) .... Twice a day 6)  Spiriva Handihaler 18 Mcg Caps (Tiotropium bromide monohydrate) .... Once a day 7)  Singulair 10 Mg Tabs (Montelukast sodium) .... Take 1 tablet by mouth once a day 8)  Fluticasone Propionate 50 Mcg/act Susp (Fluticasone propionate) .Marland Kitchen.. 1 spray each nostril two times a day 9)  Benadryl 25 Mg Tabs (Diphenhydramine hcl) .... Take 1 tablet by mouth at bedtime 10)  Lasix 40 Mg Tabs (Furosemide) .... Take two tabs daily 11)  Cozaar 50 Mg Tabs (Losartan potassium) .... Take 1 tablet by mouth twice a day 12)  Warfarin Sodium 5 Mg Tabs (Warfarin sodium) .... Take as directed. 13)  Fluoxetine Hcl 20 Mg  Caps (Fluoxetine hcl) .... Take 2 capsules by mouth once a day 14)  Detrol La 2 Mg Xr24h-cap (Tolterodine tartrate) .... Take 1 capsule by mouth once a day 15)  Omeprazole 20 Mg Cpdr (Omeprazole) .... Take 1 capsule by mouth two times a day 16)  Reglan 5 Mg Tabs (Metoclopramide hcl) .... Take 1 tablet by mouth three times a day before meals and at bedtime. 17)  Miralax Powd (Polyethylene glycol 3350) .... Use as directed 18)  Tylenol Extra Strength 500 Mg Tabs (Acetaminophen) .... Take 2 tablets by mouth two times a day 19)  Colchicine 0.6 Mg Tabs (Colchicine) .... As directed 20)  Amlodipine Besylate 5 Mg Tabs (Amlodipine besylate) .... Take 1 tablet by mouth once a day  Other Orders: T-Protime (in-house) ([id number])  Patient Instructions: 1)  Please schedule a follow-up appointment in 1 month. 2)  Please call if breathing does not improve. Prescriptions: LASIX 40 MG TABS (FUROSEMIDE) Take two tabs daily  #60 x 11   Entered and Authorized by:   Ulyess Mort MD   Signed by:   Ulyess Mort MD on 07/18/2009   Method used:   Electronically to        [facility] [phone]* (retail)       [address]       [place], [place]  [address]       Ph: [phone]       Fax: [phone]   RxID:   [id number]  Process Orders Check Orders Results:     [facility]: Check successful Tests Sent for requisitioning (Jul 18, 2009 4:36 PM):     07/18/2009: [facility] -- T-Basic Metabolic Panel [phone] (signed)    Prevention & Chronic Care Immunizations   Influenza vaccine: Fluvax MCR  (11/27/2007)   Influenza vaccine deferral: Not indicated  (08/25/2008)    Tetanus booster: Not documented   Td booster deferral: Deferred  (08/25/2008)    Pneumococcal vaccine: Not documented   Pneumococcal vaccine deferral: Deferred  (08/25/2008)    H. zoster vaccine: Not documented   H. zoster vaccine deferral: Deferred  (08/25/2008)  Colorectal Screening    Hemoccult: Not documented   Hemoccult action/deferral: Not indicated  (05/19/2009)    Colonoscopy: Results: Polyp.  Location:  [facility].    (05/27/2001)   Colonoscopy action/deferral: Not indicated  (05/19/2009)   Colonoscopy due: 05/28/2006  Other Screening   Pap smear: Not documented   Pap smear action/deferral: Not indicated-other  (08/25/2008)    Mammogram: Not documented    DXA bone density scan: Not documented   DXA bone density action/deferral: Deferred  (08/25/2008)   Smoking status: quit  (  07/18/2009)  Diabetes Mellitus   HgbA1C: 6.2  (03/10/2009)    Eye exam: Not documented    Foot exam: Not documented   High risk foot: Not documented   Foot care education: Not documented    Urine microalbumin/creatinine ratio: 48.0  (05/20/2009)   Urine microalbumin action/deferral: Ordered  Lipids   Total Cholesterol: 131  (06/22/2007)   Lipid panel action/deferral: Not indicated   LDL: 107  --  01/23/2008  (03/08/2008)   LDL Direct: Not documented   HDL: 51  (06/22/2007)   Triglycerides: 86  (06/22/2007)    SGOT (AST): 26  (08/25/2008)   BMP action: Not indicated   SGPT (ALT): 16  (08/25/2008)   Alkaline phosphatase: 128  (08/25/2008)   Total bilirubin: 0.5  (08/25/2008)  Hypertension   Last Blood Pressure: 158 / 76  (07/18/2009)   Serum creatinine: 0.80  (03/10/2009)   Serum potassium 4.1  (03/10/2009)  Self-Management Support :   Personal Goals (by the next clinic visit) :      Personal blood pressure goal: 130/80  (07/18/2009)     Personal LDL goal: 100  (07/18/2009)    Patient will work on the following items until the next clinic visit to reach self-care goals:     Medications and monitoring: take my medicines every day  (07/18/2009)     Eating: eat foods that are low in salt, eat baked foods instead of fried foods  (07/18/2009)    Diabetes self-management support: Pre-printed educational material, Resources for patients handout   (07/18/2009)    Hypertension self-management support: Pre-printed educational material, Resources for patients handout  (07/18/2009)    Lipid self-management support: Pre-printed educational material, Resources for patients handout  (07/18/2009)        Resource handout printed.   Process Orders Check Orders Results:     [facility]: Check successful Tests Sent for requisitioning (Jul 18, 2009 4:36 PM):     07/18/2009: [facility] -- T-Basic Metabolic Panel [phone] (signed)    Laboratory Results   Blood Tests   Date/Time Received: Jul 18, 2009 2:44 PM  Date/Time Reported: Burke Keels  Jul 18, 2009 2:44 PM    INR: 2.0   (Normal Range: 0.88-1.12   Therap INR: 2.0-3.5) Comments: Results reported to Dr. Aundria Rud and Chancy Milroy Vickie [facility]  Jul 18, 2009 2:45 PM

## 2010-03-27 NOTE — Assessment & Plan Note (Signed)
Summary: EST-CK/FU/MEDS/CFB   Vital Signs:  Patient profile:   77 year old female Height:      62 inches (157.48 cm) Weight:      281.1 pounds (127.77 kg) BMI:     51.60 Temp:     97.9 degrees F (36.61 degrees C) oral Pulse rate:   51 / minute BP sitting:   151 / 66  (left arm) Cuff size:   wrist  Vitals Entered By: Cynda Familia Duncan Dull) (May 19, 2009 9:32 AM) CC: routine f/u c/o cough x 6 weeks Is Patient Diabetic? No Pain Assessment Patient in pain? no      Nutritional Status BMI of > 30 = obese  Have you ever been in a relationship where you felt threatened, hurt or afraid?No   Does patient need assistance? Functional Status Cook/clean, Shopping Ambulation Impaired:Risk for fall, Wheelchair   Primary Care Provider:  Ulyess Mort MD  CC:  routine f/u c/o cough x 6 weeks.  History of Present Illness: 62 woman with COPD. Recent visit for increased cough. Rx'd for bronchitis with levoquin.  Much improved.  No active complaints today.  Preventive Screening-Counseling & Management  Alcohol-Tobacco     Smoking Status: quit     Year Quit: 1968  Allergies: No Known Drug Allergies  Physical Exam  Lungs:  clear Heart:  irregular with systolic murmur. Extremities:  no edema   Impression & Recommendations:  Problem # 1:  COUGH (ICD-786.2) better  Problem # 2:  HYPERTENSION (ICD-401.9) Last 3 systolics:  172, 121,151. No orthopnea or chest pain. Will await one more reading before increasing amlodipine.   Her updated medication list for this problem includes:    Lasix 40 Mg Tabs (Furosemide) .Marland Kitchen... Take one tab daily    Cozaar 50 Mg Tabs (Losartan potassium) .Marland Kitchen... Take 1 tablet by mouth twice a day    Amlodipine Besylate 5 Mg Tabs (Amlodipine besylate) .Marland Kitchen... Take 1 tablet by mouth once a day  Complete Medication List: 1)  Lipitor 40 Mg Tabs (Atorvastatin calcium) .... Take 1 tablet by mouth once a day 2)  Fish Oil 300 Mg Caps (Omega-3 fatty acids)  .... Take 3 tabs by mouth daily. 3)  Levothroid 175 Mcg Tabs (Levothyroxine sodium) .... Take 1 tablet by mouth once a day 4)  Proventil Hfa 108 (90 Base) Mcg/act Aers (Albuterol sulfate) .Marland Kitchen.. 1 to 2 puffs two times a day as needed for wheezing. 5)  Advair Diskus 250-50 Mcg/dose Misc (Fluticasone-salmeterol) .... Twice a day 6)  Spiriva Handihaler 18 Mcg Caps (Tiotropium bromide monohydrate) .... Once a day 7)  Singulair 10 Mg Tabs (Montelukast sodium) .... Take 1 tablet by mouth once a day 8)  Fluticasone Propionate 50 Mcg/act Susp (Fluticasone propionate) .Marland Kitchen.. 1 spray each nostril two times a day 9)  Benadryl 25 Mg Tabs (Diphenhydramine hcl) .... Take 1 tablet by mouth at bedtime 10)  Lasix 40 Mg Tabs (Furosemide) .... Take one tab daily 11)  Cozaar 50 Mg Tabs (Losartan potassium) .... Take 1 tablet by mouth twice a day 12)  Warfarin Sodium 5 Mg Tabs (Warfarin sodium) .... Take as directed. 13)  Fluoxetine Hcl 20 Mg Caps (Fluoxetine hcl) .... Take 2 capsules by mouth once a day 14)  Detrol La 2 Mg Xr24h-cap (Tolterodine tartrate) .... Take 1 capsule by mouth once a day 15)  Omeprazole 20 Mg Cpdr (Omeprazole) .... Take 1 capsule by mouth two times a day 16)  Reglan 5 Mg Tabs (Metoclopramide hcl) .... Take 1  tablet by mouth three times a day before meals and at bedtime. 17)  Miralax Powd (Polyethylene glycol 3350) .... Use as directed 18)  Tylenol Extra Strength 500 Mg Tabs (Acetaminophen) .... Take 2 tablets by mouth two times a day 19)  Colchicine 0.6 Mg Tabs (Colchicine) .... As directed 20)  Amlodipine Besylate 5 Mg Tabs (Amlodipine besylate) .... Take 1 tablet by mouth once a day  Other Orders: T-Protime (in-house) ([id number]) T-Urine Microalbumin w/creat. ratio [id number])  Patient Instructions: 1)  Please schedule a follow-up appointment in 2 months.  Prevention & Chronic Care Immunizations   Influenza vaccine: Fluvax MCR  (11/27/2007)   Influenza vaccine deferral: Not  indicated  (08/25/2008)    Tetanus booster: Not documented   Td booster deferral: Deferred  (08/25/2008)    Pneumococcal vaccine: Not documented   Pneumococcal vaccine deferral: Deferred  (08/25/2008)    H. zoster vaccine: Not documented   H. zoster vaccine deferral: Deferred  (08/25/2008)  Colorectal Screening   Hemoccult: Not documented   Hemoccult action/deferral: Not indicated  (05/19/2009)    Colonoscopy: Results: Polyp.  Location:  [facility].    (05/27/2001)   Colonoscopy action/deferral: Not indicated  (05/19/2009)   Colonoscopy due: 05/28/2006  Other Screening   Pap smear: Not documented   Pap smear action/deferral: Not indicated-other  (08/25/2008)    Mammogram: Not documented    DXA bone density scan: Not documented   DXA bone density action/deferral: Deferred  (08/25/2008)  Reports requested:  Smoking status: quit  (05/19/2009)  Diabetes Mellitus   HgbA1C: 6.2  (03/10/2009)    Eye exam: Not documented   Last eye exam report requested.    Foot exam: Not documented   High risk foot: Not documented   Foot care education: Not documented    Urine microalbumin/creatinine ratio: Not documented   Urine microalbumin action/deferral: Ordered    Diabetes flowsheet reviewed?: Yes   Progress toward A1C goal: Unchanged  Lipids   Total Cholesterol: 131  (06/22/2007)   Lipid panel action/deferral: Not indicated   LDL: 107  --  01/23/2008  (03/08/2008)   LDL Direct: Not documented   HDL: 51  (06/22/2007)   Triglycerides: 86  (06/22/2007)    SGOT (AST): 26  (08/25/2008)   BMP action: Not indicated   SGPT (ALT): 16  (08/25/2008)   Alkaline phosphatase: 128  (08/25/2008)   Total bilirubin: 0.5  (08/25/2008)  Hypertension   Last Blood Pressure: 151 / 66  (05/19/2009)   Serum creatinine: 0.80  (03/10/2009)   Serum potassium 4.1  (03/10/2009)    Hypertension flowsheet reviewed?: Yes   Progress toward BP goal: Unchanged   Hypertension comments:  Defer further treatment increase until further testing  Self-Management Support :    Diabetes self-management support: Written self-care plan, Resources for patients handout  (04/27/2009)    Hypertension self-management support: Written self-care plan, Resources for patients handout  (04/27/2009)    Lipid self-management support: Education handout, Resources for patients handout  (04/27/2009)    Nursing Instructions: Request report of last diabetic eye exam   Process Orders Check Orders Results:     [facility]: Check successful Tests Sent for requisitioning (May 19, 2009 10:25 AM):     05/19/2009: [facility] -- T-Urine Microalbumin w/creat. ratio [[id number]] (signed)     Appended Document: Lab Order/Protime INR Results    Lab Visit  Laboratory Results   Blood Tests   Date/Time Received: May 19, 2009 10:31 AM Date/Time Reported: Alric Quan  May 19, 2009 10:31 AM   INR: 2.3   (Normal Range: 0.88-1.12   Therap INR: 2.0-3.5)   Orders Today:

## 2010-03-27 NOTE — Assessment & Plan Note (Signed)
Summary: 9AM COU APPT/CH  Anticoagulant Therapy Managed by: Barbera Setters. [name]  PharmD CACP Referring MD: Arnaldo Natal MD PCP: Ulyess Mort MD [facility] Attending: Margarito Liner MD Indication 1: Atrial fibrillation Indication 2: Aftercare long term use Anticoagulants V58.61 Start date: 02/10/2002 Duration: Indefinite  Patient Assessment Reviewed by: Chancy Milroy PharmD  September 04, 2009 Medication review: verified warfarin dosage & schedule,verified previous prescription medications, verified doses & any changes, verified new medications, reviewed OTC medications, reviewed OTC health products-vitamins supplements etc Complications: none Dietary changes: none   Health status changes: none   Lifestyle changes: none   Recent/future hospitalizations: none   Recent/future procedures: none   Recent/future dental: none Patient Assessment Part 2:  Have you MISSED ANY DOSES or CHANGED TABLETS?  No missed Warfarin doses or changed tablets.  Have you had any BRUISING or BLEEDING ( nose or gum bleeds,blood in urine or stool)?  No reported bruising or bleeding in nose, gums, urine, stool.  Have you STARTED or STOPPED any MEDICATIONS, including OTC meds,herbals or supplements?  No other medications or herbal supplements were started or stopped.  Have you CHANGED your DIET, especially green vegetables,or ALCOHOL intake?  No changes in diet or alcohol intake.  Have you had any ILLNESSES or HOSPITALIZATIONS?  No reported illnesses or hospitalizations  Have you had any signs of CLOTTING?(chest discomfort,dizziness,shortness of breath,arms tingling,slurred speech,swelling or redness in leg)    No chest discomfort, dizziness, shortness of breath, tingling in arm, slurred speech, swelling, or redness in leg.     Treatment  Target INR: 2.0-2.5 INR: 2.0  Date: 09/04/2009 Regimen In:  32.5mg /week INR reflects regimen in: 2.0  New  Tablet strength: : 5mg  Regimen Out:     Sunday: 1 Tablet  Monday: 1 Tablet     Tuesday: 1 Tablet     Wednesday: 1/2 Tablet     Thursday: 1 Tablet      Friday: 1 Tablet     Saturday: 1 Tablet Total Weekly: 32.5mg /week mg  Next INR Due: 10/02/2009 Adjusted by: Barbera Setters. Alexandria Lodge III PharmD CACP   Return to anticoagulation clinic:  10/02/2009 Time of next visit: 0845    Allergies: No Known Drug Allergies

## 2010-03-27 NOTE — Progress Notes (Signed)
Summary: lab orders//kg  ---- Converted from flag ---- ---- 12/12/2009 9:13 AM, Ulyess Mort MD wrote: Purnell Shoemaker,  When Cathlean Marseilles is due for her next INR and Dr. Alexandria Lodge visit, please arrange a fasting Bmet and A1c. ------------------------------ Pt here for INR, orders entered into EMR and labs were obtained.Cynda Familia [facility])  January 01, 2010 10:18 AM

## 2010-03-27 NOTE — Progress Notes (Signed)
Summary: refill/ hla  Phone Note Refill Request Message from:  Patient on October 05, 2009 3:37 PM  Refills Requested: Medication #1:  SPIRIVA HANDIHALER 18 MCG CAPS once a day   Dosage confirmed as above?Dosage Confirmed Initial call taken by: Marin Roberts RN,  October 05, 2009 3:37 PM  Follow-up for Phone Call        Refill approved-nurse to complete Follow-up by: Ulyess Mort MD,  October 11, 2009 3:26 PM    Prescriptions: Hollie Beach 18 MCG CAPS (TIOTROPIUM BROMIDE MONOHYDRATE) once a day  #30 Each x 4   Entered and Authorized by:   Ulyess Mort MD   Signed by:   Ulyess Mort MD on 10/11/2009   Method used:   Electronically to        [facility] [phone]* (retail)       [address]       [place], [place]  [address]       Ph: [phone]       Fax: [phone]   RxID:   [id number]

## 2010-03-27 NOTE — Assessment & Plan Note (Signed)
Summary: Coumadin Clinic  Anticoagulant Therapy Managed by: [name]. [name]  PharmD CACP Referring MD: [name] Natal MD PCP: [name] Mort MD [facility] Attending: Darl [name], [name] Indication 1: Atrial fibrillation Indication 2: Aftercare long term use Anticoagulants V58.61 Start date: 02/10/2002 Duration: Indefinite  Patient Assessment Reviewed by: [name] PharmD  [name], [name] Medication review: verified warfarin dosage & schedule,verified previous prescription medications, verified doses & any changes, verified new medications, reviewed OTC medications, reviewed OTC health products-vitamins supplements etc Complications: none Dietary changes: none   Health status changes: none   Lifestyle changes: none   Recent/future hospitalizations: none   Recent/future procedures: none   Recent/future dental: none Patient Assessment Part 2:  Have you MISSED ANY DOSES or CHANGED TABLETS?  No missed Warfarin doses or changed tablets.  Have you had any BRUISING or BLEEDING ( nose or gum bleeds,blood in urine or stool)?  No reported bruising or bleeding in nose, gums, urine, stool.  Have you STARTED or STOPPED any MEDICATIONS, including OTC meds,herbals or supplements?  No other medications or herbal supplements were started or stopped.  Have you CHANGED your DIET, especially green vegetables,or ALCOHOL intake?  No changes in diet or alcohol intake.  Have you had any ILLNESSES or HOSPITALIZATIONS?  No reported illnesses or hospitalizations  Have you had any signs of CLOTTING?(chest discomfort,dizziness,shortness of breath,arms tingling,slurred speech,swelling or redness in leg)    No chest discomfort, dizziness, shortness of breath, tingling in arm, slurred speech, swelling, or redness in leg.     Treatment  Target INR: 2.0-2.5 INR: 1.6  Date: 01/[name]/[name] Regimen In:  27.5mg /week INR reflects regimen in: 1.6  New  Tablet strength: : 5mg  Regimen Out:     Sunday: 1 Tablet  Monday: 1 Tablet     Tuesday: 1 Tablet     Wednesday: 0 Tablet     Thursday: 1 Tablet      Friday: 1 Tablet     Saturday: 1 Tablet Total Weekly: 30.0mg /week mg  Next INR Due: 01/31/[name] Adjusted by: [name]. [name] PharmD CACP   Return to anticoagulation clinic:  01/31/[name] Time of next visit: 1030    Allergies: No Known Drug Allergies

## 2010-03-27 NOTE — Miscellaneous (Signed)
Summary: Orders Update  Clinical Lists Changes  Orders: Added new Test order of T-Urine Microalbumin w/creat. ratio [phone]) - Signed Added new Test order of T-Urinalysis ([id number]) - Signed Added new Test order of T-Basic Metabolic Panel [phone]) - Signed     Process Orders Check Orders Results:     [facility]: Check successful Tests Sent for requisitioning (December 12, 2009 9:19 AM):     12/06/2009: [facility] -- T-Urine Microalbumin w/creat. ratio [[id number]] (signed)     12/06/2009: [facility] -- T-Urinalysis [[id number]] (signed)     12/06/2009: [facility] -- T-Basic Metabolic Panel [phone] (signed)    Phone Note Other Incoming   Call placed by: lab appt//kg Summary of Call: Pt here today 10/17 for repeat labs and to f/u with Dr Alexandria Lodge for protime.  Pt unable to void today, was given water and waited, but still unable to provide urine sample.  She is expecting her home health nurse at 9am and needs to leave so she can get her daily personal care needs addressed. Pt is scheduled to return on 10/31 to f/u with Dr Alexandria Lodge, urine sample will be obtained at that visit.  Will forward message to Dr Aundria Rud to see if it can wait.Cynda Familia [facility])  December 11, 2009 9:21 AM

## 2010-03-27 NOTE — Assessment & Plan Note (Signed)
Summary: est-ck/fu/meds/cfb   Vital Signs:  Patient profile:   77 year old female Height:      62 inches (157.48 cm) Weight:      273. pounds BMI:     50.11 O2 Sat:      94 % on 3 L/min Temp:     96.7 degrees F (35.94 degrees C) oral Pulse rate:   58 / minute BP sitting:   172 / 75  (right arm) Cuff size:   right wrist  Vitals Entered By: Krystal Eaton Duncan Dull) (March 10, 2009 11:01 AM)  O2 Flow:  3 L/min  Primary Care Provider:  Ulyess Mort MD   History of Present Illness: 48 woman with COPD and morbid obesity. She is doing well today, cheerful.  Has usual chronic symptoms but no flares of anything today or in recent weeks.  Has had no ER or hosp visits.  On all meds as listed.  Wgt unchanged.  Allergies: No Known Drug Allergies   Impression & Recommendations:  Problem # 1:  HYPERTENSION (ICD-401.9)  BP uncontrolled.  No immediate symptoms but does have CHF and recent onset of mild, sustained hyperglycemia.  Will add amlodipine 5.   Her updated medication list for this problem includes:    Lasix 40 Mg Tabs (Furosemide) .Marland Kitchen... Take one tab daily    Cozaar 50 Mg Tabs (Losartan potassium) .Marland Kitchen... Take 1 tablet by mouth twice a day    Amlodipine Besylate 5 Mg Tabs (Amlodipine besylate) .Marland Kitchen... Take 1 tablet by mouth once a day  Orders: T-Basic Metabolic Panel [id number])  Problem # 2:  DIABETES MELLITUS (ICD-250.00) Has had several marginal BGs over past year.  A1c = 6.7 last year and 6.2 today. She probably meets case definition but does not require treatment.   Her updated medication list for this problem includes:    Cozaar 50 Mg Tabs (Losartan potassium) .Marland Kitchen... Take 1 tablet by mouth twice a day  Problem # 3:  COPD (ICD-496) On O2 and meds as listed.  Stable chronic dyspnea.   Her updated medication list for this problem includes:    Proventil Hfa 108 (90 Base) Mcg/act Aers (Albuterol sulfate) .Marland Kitchen... 1 to 2 puffs two times a day as needed for wheezing.  Advair Diskus 250-50 Mcg/dose Misc (Fluticasone-salmeterol) .Marland Kitchen... Twice a day    Spiriva Handihaler 18 Mcg Caps (Tiotropium bromide monohydrate) ..... Once a day    Singulair 10 Mg Tabs (Montelukast sodium) .Marland Kitchen... Take 1 tablet by mouth once a day  Problem # 4:  DEPRESSION (ICD-311) Has been in good spirits and mentally clear each time I have seen her.  Reportedly has spells of apparant confusion.   Her updated medication list for this problem includes:    Fluoxetine Hcl 20 Mg Caps (Fluoxetine hcl) .Marland Kitchen... Take 2 capsules by mouth once a day  Problem # 5:  GOUT (ICD-274.9) She reports an attack of gout in both feet about 2 months ago.  No evident inflammation today although she speaks of it as "gradually clearing".  Onset sounds like typical podagra.  Had no attacks in prior year so interval rx is not required.   Her updated medication list for this problem includes:    Colchicine 0.6 Mg Tabs (Colchicine) .Marland Kitchen... As directed  Complete Medication List: 1)  Lipitor 40 Mg Tabs (Atorvastatin calcium) .... Take 1 tablet by mouth once a day 2)  Fish Oil 300 Mg Caps (Omega-3 fatty acids) .... Take 3 tabs by mouth daily. 3)  Levothroid  175 Mcg Tabs (Levothyroxine sodium) .... Take 1 tablet by mouth once a day 4)  Proventil Hfa 108 (90 Base) Mcg/act Aers (Albuterol sulfate) .Marland Kitchen.. 1 to 2 puffs two times a day as needed for wheezing. 5)  Advair Diskus 250-50 Mcg/dose Misc (Fluticasone-salmeterol) .... Twice a day 6)  Spiriva Handihaler 18 Mcg Caps (Tiotropium bromide monohydrate) .... Once a day 7)  Singulair 10 Mg Tabs (Montelukast sodium) .... Take 1 tablet by mouth once a day 8)  Fluticasone Propionate 50 Mcg/act Susp (Fluticasone propionate) .Marland Kitchen.. 1 spray each nostril two times a day 9)  Benadryl 25 Mg Tabs (Diphenhydramine hcl) .... Take 1 tablet by mouth at bedtime 10)  Lasix 40 Mg Tabs (Furosemide) .... Take one tab daily 11)  Cozaar 50 Mg Tabs (Losartan potassium) .... Take 1 tablet by mouth twice  a day 12)  Warfarin Sodium 5 Mg Tabs (Warfarin sodium) .... Take 1 tablet by mouth daily. 13)  Fluoxetine Hcl 20 Mg Caps (Fluoxetine hcl) .... Take 2 capsules by mouth once a day 14)  Detrol La 2 Mg Xr24h-cap (Tolterodine tartrate) .... Take 1 capsule by mouth once a day 15)  Omeprazole 20 Mg Cpdr (Omeprazole) .... Take 1 capsule by mouth two times a day 16)  Reglan 5 Mg Tabs (Metoclopramide hcl) .... Take 1 tablet by mouth three times a day before meals and at bedtime. 17)  Miralax Powd (Polyethylene glycol 3350) .... Use as directed 18)  Tylenol Extra Strength 500 Mg Tabs (Acetaminophen) .... Take 2 tablets by mouth two times a day 19)  Colchicine 0.6 Mg Tabs (Colchicine) .... As directed 20)  Amlodipine Besylate 5 Mg Tabs (Amlodipine besylate) .... Take 1 tablet by mouth once a day  Other Orders: T- Capillary Blood Glucose (78469) T-Hgb A1C (in-house) ([id number]) T-Protime (in-house) ([id number])  Patient Instructions: 1)  Please schedule a follow-up appointment in 3 months. Prescriptions: AMLODIPINE BESYLATE 5 MG TABS (AMLODIPINE BESYLATE) Take 1 tablet by mouth once a day  #31 x 11   Entered and Authorized by:   Ulyess Mort MD   Signed by:   Ulyess Mort MD on 03/10/2009   Method used:   Electronically to        [facility] [phone]* (retail)       [address]       [place], [place]  [address]       Ph: [phone]       Fax: [phone]   RxID:   [id number]   Laboratory Results   Blood Tests   Date/Time Received: March 10, 2009 11:09 AM Date/Time Reported: Burke Keels  March 10, 2009 11:09 AM  HGBA1C: 6.2%   (Normal Range: Non-Diabetic - 3-6%   Control Diabetic - 6-8%) CBG Random:: 214mg /dL  INR: 1.6   (Normal Range: 0.88-1.12   Therap INR: 2.0-3.5)     Process Orders Check Orders Results:     [facility]: Check successful Tests Sent for requisitioning (March 10, 2009 12:26 PM):     03/10/2009: [facility]  -- T-Basic Metabolic Panel [phone] (signed)

## 2010-03-27 NOTE — Progress Notes (Signed)
Summary: Prior Authorization-Omeprazole 20mg  (approved)//kg  Phone Note Outgoing Call   Call placed by: Cynda Familia Duncan Dull),  January 24, 2010 4:50 PM Call placed to: [phone] Summary of Call: Call made to iniate prior authorization on omeprazole 20mg .  Given case id# [id number].  Med approved for 1 year (12/2010).  Will contact pt and make her aware.  Pharmacy was also contacted.Cynda Familia [facility])  January 24, 2010 4:57 PM
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
## 2010-03-27 NOTE — Letter (Signed)
Summary: ADVANCED-CMN-CORRECTED FORM  ADVANCED-CMN-CORRECTED FORM   Imported By: Shon Hough 12/14/2009 15:02:52  _____________________________________________________________________  External Attachment:    Type:   Image     Comment:   External Document

## 2010-03-27 NOTE — Progress Notes (Signed)
Summary: refill/gg  Phone Note Refill Request  on June 13, 2009 11:11 AM  Refills Requested: Medication #1:  LASIX 40 MG TABS Take one tab daily  Method Requested: Electronic Initial call taken by: Merrie Roof RN,  June 13, 2009 11:12 AM    Prescriptions: LASIX 40 MG TABS (FUROSEMIDE) Take one tab daily  #30 Each x 11   Entered and Authorized by:   Ulyess Mort MD   Signed by:   Ulyess Mort MD on 06/14/2009   Method used:   Electronically to        [facility] [phone]* (retail)       [address]       [place], [place]  [address]       Ph: [phone]       Fax: [phone]   RxID:   [id number]

## 2010-03-27 NOTE — Progress Notes (Signed)
  Phone Note Call from Patient   Caller: Patient Call For: Hulen Luster PharmD CACP Reason for Call: Lab or Test Results Details for Reason: Called by patient regarding INR results obtained 24-May-11 which was 2.0 Details of Complaint: No complaints., Details of Action Taken: Instructed to increase to 1 tablet by mouth daily warfarin. Summary of Call: Telemanagement of warfarin Initial call taken by: Hulen Luster PharmD CACP

## 2010-03-27 NOTE — Progress Notes (Signed)
Summary: refill/gg  Phone Note Refill Request  on May 17, 2009 3:07 PM  Refills Requested: Medication #1:  WARFARIN SODIUM 5 MG TABS Take 1 tablet by mouth daily.   Last Refilled: 04/16/2009  Method Requested: Electronic Initial call taken by: Merrie Roof RN,  May 17, 2009 3:08 PM  Follow-up for Phone Call        Refill approved-nurse to complete Follow-up by: Ulyess Mort MD,  May 17, 2009 3:24 PM    New/Updated Medications: WARFARIN SODIUM 5 MG TABS (WARFARIN SODIUM) Take as directed. Prescriptions: WARFARIN SODIUM 5 MG TABS (WARFARIN SODIUM) Take as directed.  #30 x 11   Entered and Authorized by:   Ulyess Mort MD   Signed by:   Ulyess Mort MD on 05/17/2009   Method used:   Electronically to        [facility] [phone]* (retail)       [address]       [place], [place]  [address]       Ph: [phone]       Fax: [phone]   RxID:   [id number]

## 2010-03-27 NOTE — Letter (Signed)
Summary: ADVANCED CMN ORDER  ADVANCED CMN ORDER   Imported By: Shon Hough 12/14/2009 14:40:13  _____________________________________________________________________  External Attachment:    Type:   Image     Comment:   External Document

## 2010-03-27 NOTE — Progress Notes (Signed)
  Phone Note From Other Clinic   Caller: Lynn Ito Call For: Hulen Luster PharmD CACP Reason for Call: Schedule Patient Appt, Patient [facility], Medication Check, Diagnosis Check Request: Talk with Provider, Call Patient Action Taken: Phone Call Completed, Provider Notified, Patient called Details for Reason: Called by Lynn Ito of [facility] citing that patient was seen by Dr, Riogers today in [facility]. INR = 2.8 on 35mg /wk warfarin. Details of Complaint: No blood or bleeding reported in nursing assessment. Details of Action Taken: Given that patient is being commenced upon SMX-TMP--will perform a 15% (approximately) dose REDUCTION in her warfarin therapy to Su-5mg M-5mg T-2.5mg W-5mg Th-2.5mg F-5mg Sa-2.5mg  = 27.5mg /wk regimen and see patient NEXT MONDAY 27-Jun-11 in [facility] to repeat INR. Summary of Call: Telemanagement of warfarin/interpretation of INR in setting of commencing SMX-TMP. Next visit in 6 days 27-Jun-11. Initial call taken by: Hulen Luster PharmD CACP     Appended Document:  Patient was CALLED AT HOME and instructed HOW to modify/reduce her warfarin dosage daily until seen in [facility] on Monday 27-Jun-11.

## 2010-03-27 NOTE — Assessment & Plan Note (Signed)
Summary: OPC COU  Anticoagulant Therapy Managed by: Barbera Setters. [name]  PharmD CACP Referring MD: Arnaldo Natal MD PCP: Ulyess Mort MD [facility] Attending: Lowella Bandy MD Indication 1: Atrial fibrillation Indication 2: Aftercare long term use Anticoagulants V58.61 Start date: 02/10/2002 Duration: Indefinite  Patient Assessment Reviewed by: Chancy Milroy PharmD  June 19, 2009 Medication review: verified warfarin dosage & schedule,verified previous prescription medications, verified doses & any changes, verified new medications, reviewed OTC medications, reviewed OTC health products-vitamins supplements etc Complications: none Dietary changes: none   Health status changes: none   Lifestyle changes: none   Recent/future hospitalizations: none   Recent/future procedures: none   Recent/future dental: none Patient Assessment Part 2:  Have you MISSED ANY DOSES or CHANGED TABLETS?  No missed Warfarin doses or changed tablets.  Have you had any BRUISING or BLEEDING ( nose or gum bleeds,blood in urine or stool)?  No reported bruising or bleeding in nose, gums, urine, stool.  Have you STARTED or STOPPED any MEDICATIONS, including OTC meds,herbals or supplements?  No other medications or herbal supplements were started or stopped.  Have you CHANGED your DIET, especially green vegetables,or ALCOHOL intake?  No changes in diet or alcohol intake.  Have you had any ILLNESSES or HOSPITALIZATIONS?  No reported illnesses or hospitalizations  Have you had any signs of CLOTTING?(chest discomfort,dizziness,shortness of breath,arms tingling,slurred speech,swelling or redness in leg)    No chest discomfort, dizziness, shortness of breath, tingling in arm, slurred speech, swelling, or redness in leg.     Treatment  Target INR: 2.0-2.5 INR: 2.6  Date: 06/19/2009 Regimen In:  32.5mg /week INR reflects regimen in: 2.6  New  Tablet strength: : 5mg  Regimen Out:     Sunday: 1 Tablet     Monday: 1  Tablet     Tuesday: 1 Tablet     Wednesday: 1/2 Tablet     Thursday: 1 Tablet      Friday: 1 Tablet     Saturday: 1 Tablet Total Weekly: 32.5mg /week mg  Next INR Due: 07/17/2009 Adjusted by: Barbera Setters. Alexandria Lodge III PharmD CACP   Return to anticoagulation clinic:  07/17/2009 Time of next visit: 0915    Allergies: No Known Drug Allergies

## 2010-03-27 NOTE — Assessment & Plan Note (Signed)
Summary: COU/SB.  Anticoagulant Therapy Managed by: Barbera Setters. [name]  PharmD CACP Referring MD: Arnaldo Natal MD PCP: Ulyess Mort MD [facility] Attending: Coralee Pesa MD, Levada Schilling Indication 1: Atrial fibrillation Indication 2: Aftercare long term use Anticoagulants V58.61 Start date: 02/10/2002 Duration: Indefinite  Patient Assessment Reviewed by: Chancy Milroy PharmD  November 13, 2009 Medication review: verified warfarin dosage & schedule,verified previous prescription medications, verified doses & any changes, verified new medications, reviewed OTC medications, reviewed OTC health products-vitamins supplements etc Complications: none Dietary changes: none   Health status changes: none   Lifestyle changes: none   Recent/future hospitalizations: none   Recent/future procedures: none   Recent/future dental: none Patient Assessment Part 2:  Have you MISSED ANY DOSES or CHANGED TABLETS?  YES. Missed a dose Saturday 17-Sep-11 accounting for subtherapeutic response today.  Have you had any BRUISING or BLEEDING ( nose or gum bleeds,blood in urine or stool)?  No reported bruising or bleeding in nose, gums, urine, stool.  Have you STARTED or STOPPED any MEDICATIONS, including OTC meds,herbals or supplements?  No other medications or herbal supplements were started or stopped.  Have you CHANGED your DIET, especially green vegetables,or ALCOHOL intake?  No changes in diet or alcohol intake.  Have you had any ILLNESSES or HOSPITALIZATIONS?  No reported illnesses or hospitalizations  Have you had any signs of CLOTTING?(chest discomfort,dizziness,shortness of breath,arms tingling,slurred speech,swelling or redness in leg)    No chest discomfort, dizziness, shortness of breath, tingling in arm, slurred speech, swelling, or redness in leg.     Treatment  Target INR: 2.0-2.5 INR: 1.9  Date: 11/13/2009 Regimen In:  32.5mg /week INR reflects regimen in: 1.9  New  Tablet strength: :  5mg  Regimen Out:     Sunday: 1 Tablet     Monday: 1 Tablet     Tuesday: 1 Tablet     Wednesday: 1 Tablet     Thursday: 1 Tablet      Friday: 1 Tablet     Saturday: 1 Tablet Total Weekly: 35.0mg /week mg  Next INR Due: 12/11/2009 Adjusted by: Barbera Setters. Alexandria Lodge III PharmD CACP   Return to anticoagulation clinic:  12/11/2009 Time of next visit: 0900    Allergies: No Known Drug Allergies

## 2010-03-27 NOTE — Assessment & Plan Note (Signed)
Summary: COU/CH  Anticoagulant Therapy Managed by: Barbera Setters. [name]  PharmD CACP Referring MD: Arnaldo Natal MD PCP: Ulyess Mort MD [facility] Attending: Clydie Braun MD Indication 1: Atrial fibrillation Indication 2: Aftercare long term use Anticoagulants V58.61 Start date: 02/10/2002 Duration: Indefinite  Patient Assessment Reviewed by: Chancy Milroy PharmD  May 01, 2009 Medication review: verified warfarin dosage & schedule,verified previous prescription medications, verified doses & any changes, verified new medications, reviewed OTC medications, reviewed OTC health products-vitamins supplements etc Complications: none Dietary changes: none   Health status changes: none   Lifestyle changes: none   Recent/future hospitalizations: none   Recent/future procedures: none   Recent/future dental: none Patient Assessment Part 2:  Have you MISSED ANY DOSES or CHANGED TABLETS?  No missed Warfarin doses or changed tablets.  Have you had any BRUISING or BLEEDING ( nose or gum bleeds,blood in urine or stool)?  No reported bruising or bleeding in nose, gums, urine, stool.  Have you STARTED or STOPPED any MEDICATIONS, including OTC meds,herbals or supplements?  YES. She completed her course of antibiotics this AM.  Have you CHANGED your DIET, especially green vegetables,or ALCOHOL intake?  No changes in diet or alcohol intake.  Have you had any ILLNESSES or HOSPITALIZATIONS?  No reported illnesses or hospitalizations  Have you had any signs of CLOTTING?(chest discomfort,dizziness,shortness of breath,arms tingling,slurred speech,swelling or redness in leg)    No chest discomfort, dizziness, shortness of breath, tingling in arm, slurred speech, swelling, or redness in leg.     Treatment  Target INR: 2.0-2.5 INR: 2.2  Date: 05/01/2009 Regimen In:  35.0mg /week INR reflects regimen in: 2.2  New  Tablet strength: : 5mg  Regimen Out:     Sunday: 1 Tablet     Monday: 1 Tablet  Tuesday: 1 Tablet     Wednesday: 1/2 Tablet     Thursday: 1 Tablet      Friday: 1 Tablet     Saturday: 1 Tablet Total Weekly: 32.5mg /week mg  Next INR Due: 05/22/2009 Adjusted by: Barbera Setters. Alexandria Lodge III PharmD CACP   Return to anticoagulation clinic:  05/22/2009 Time of next visit: 0915    Allergies: No Known Drug Allergies

## 2010-03-27 NOTE — Assessment & Plan Note (Signed)
Summary: Coumadin Clinic  Anticoagulant Therapy Managed by: Barbera Setters. [name]  PharmD CACP Referring MD: Arnaldo Natal MD PCP: Ulyess Mort MD [facility] Attending: Margarito Liner MD Indication 1: Atrial fibrillation Indication 2: Aftercare long term use Anticoagulants V58.61 Start date: 02/10/2002 Duration: Indefinite  Patient Assessment Reviewed by: Chancy Milroy PharmD  September 04, 2009 Medication review: verified warfarin dosage & schedule,verified previous prescription medications, verified doses & any changes, verified new medications, reviewed OTC medications, reviewed OTC health products-vitamins supplements etc Complications: none Dietary changes: none   Health status changes: none   Lifestyle changes: none   Recent/future hospitalizations: none   Recent/future procedures: none   Recent/future dental: none Patient Assessment Part 2:  Have you MISSED ANY DOSES or CHANGED TABLETS?  No missed Warfarin doses or changed tablets.  Have you had any BRUISING or BLEEDING ( nose or gum bleeds,blood in urine or stool)?  No reported bruising or bleeding in nose, gums, urine, stool.  Have you STARTED or STOPPED any MEDICATIONS, including OTC meds,herbals or supplements?  No other medications or herbal supplements were started or stopped.  Have you CHANGED your DIET, especially green vegetables,or ALCOHOL intake?  No changes in diet or alcohol intake.  Have you had any ILLNESSES or HOSPITALIZATIONS?  No reported illnesses or hospitalizations  Have you had any signs of CLOTTING?(chest discomfort,dizziness,shortness of breath,arms tingling,slurred speech,swelling or redness in leg)    No chest discomfort, dizziness, shortness of breath, tingling in arm, slurred speech, swelling, or redness in leg.     Treatment  Target INR: 2.0-2.5 INR: 2.0  Date: 09/04/2009 Regimen In:  32.5mg /week INR reflects regimen in: 2.0  New  Tablet strength: : 5mg  Regimen Out:     Sunday: 1 Tablet  Monday: 1 Tablet     Tuesday: 1 Tablet     Wednesday: 1/2 Tablet     Thursday: 1 Tablet      Friday: 1 Tablet     Saturday: 1 Tablet Total Weekly: 32.5mg /week mg  Next INR Due: 10/02/2009 Adjusted by: Barbera Setters. Alexandria Lodge III PharmD CACP   Return to anticoagulation clinic:  10/02/2009 Time of next visit: 0900    Allergies: No Known Drug Allergies

## 2010-03-29 NOTE — Assessment & Plan Note (Signed)
Summary: COU/CH  Anticoagulant Therapy Managed by: Barbera Setters. [name]  PharmD CACP Referring MD: Arnaldo Natal MD PCP: Ulyess Mort MD [facility] Attending: Margarito Liner MD Indication 1: Atrial fibrillation Indication 2: Aftercare long term use Anticoagulants V58.61 Start date: 02/10/2002 Duration: Indefinite  Patient Assessment Reviewed by: Chancy Milroy PharmD  February 05, 2010 Medication review: verified warfarin dosage & schedule,verified previous prescription medications, verified doses & any changes, verified new medications, reviewed OTC medications, reviewed OTC health products-vitamins supplements etc Complications: none Dietary changes: none   Health status changes: none   Lifestyle changes: none   Recent/future hospitalizations: none   Recent/future procedures: none   Recent/future dental: none Patient Assessment Part 2:  Have you MISSED ANY DOSES or CHANGED TABLETS?  No missed Warfarin doses or changed tablets.  Have you had any BRUISING or BLEEDING ( nose or gum bleeds,blood in urine or stool)?  No reported bruising or bleeding in nose, gums, urine, stool.  Have you STARTED or STOPPED any MEDICATIONS, including OTC meds,herbals or supplements?  YES. Started upon cephalexin 500mg  by mouth qid x 7 days (nearing completion).  Have you CHANGED your DIET, especially green vegetables,or ALCOHOL intake?  No changes in diet or alcohol intake.  Have you had any ILLNESSES or HOSPITALIZATIONS?  YES. Seen in ED at [facility] after sustaining a fall in bathroom. Xrays revealed no fractures she states. States she was discharged upon cephalexin--the bottle which she brings.  Have you had any signs of CLOTTING?(chest discomfort,dizziness,shortness of breath,arms tingling,slurred speech,swelling or redness in leg)    No chest discomfort, dizziness, shortness of breath, tingling in arm, slurred speech, swelling, or redness in leg.     Treatment  Target INR: 2.0-2.5 INR: 3.0  Date:  02/05/2010 Regimen In:  30.0mg /week INR reflects regimen in: 3.0  New  Tablet strength: : 5mg  Regimen Out:     Sunday: 1 Tablet     Monday: 1/2 Tablet     Tuesday: 1 Tablet     Wednesday: 1/2 Tablet     Thursday: 1 Tablet      Friday: 1/2 Tablet     Saturday: 1 Tablet Total Weekly: 27.5mg /week mg  Next INR Due: 03/05/2010 Adjusted by: Barbera Setters. Alexandria Lodge III PharmD CACP   Return to anticoagulation clinic:  03/05/2010 Time of next visit: 1015    Allergies: No Known Drug Allergies

## 2010-03-29 NOTE — Progress Notes (Signed)
Summary: Refill/gh  Phone Note Refill Request Message from:  Fax from Pharmacy on March 21, 2010 3:43 PM  Refills Requested: Medication #1:  DETROL LA 2 MG XR24H-CAP Take 1 capsule by mouth once a day   Last Refilled: 02/16/2010  Medication #2:  REGLAN 5 MG TABS Take 1 tablet by mouth three times a day before meals and at bedtime.   Last Refilled: 02/02/2010  Method Requested: Electronic Initial call taken by: Angelina Ok RN,  March 21, 2010 3:44 PM  Follow-up for Phone Call        Refill approved-nurse to complete Follow-up by: Ulyess Mort MD,  March 23, 2010 9:30 AM    Prescriptions: REGLAN 5 MG TABS (METOCLOPRAMIDE HCL) Take 1 tablet by mouth three times a day before meals and at bedtime.  #120 x 11   Entered and Authorized by:   Ulyess Mort MD   Signed by:   Ulyess Mort MD on 03/23/2010   Method used:   Electronically to        [facility] [phone]* (retail)       [address]       [place], [place]  [address]       Ph: [phone]       Fax: [phone]   RxID:   [id number] DETROL LA 2 MG XR24H-CAP (TOLTERODINE TARTRATE) Take 1 capsule by mouth once a day  #30 x 11   Entered and Authorized by:   Ulyess Mort MD   Signed by:   Ulyess Mort MD on 03/23/2010   Method used:   Electronically to        [facility] [phone]* (retail)       [address]       [place], [place]  [address]       Ph: [phone]       Fax: [phone]   RxID:   [id number]

## 2010-03-29 NOTE — Assessment & Plan Note (Signed)
Summary: Coumadin Clinic  Anticoagulant Therapy Managed by: Barbera Setters. [name]  PharmD CACP Referring MD: Arnaldo Natal MD PCP: Ulyess Mort MD [facility] Attending: Rogelia Boga MD, Lanora Manis Indication 1: Atrial fibrillation Indication 2: Aftercare long term use Anticoagulants V58.61 Start date: 02/10/2002 Duration: Indefinite  Patient Assessment Reviewed by: Chancy Milroy PharmD  March 05, 2010 Medication review: verified warfarin dosage & schedule,verified previous prescription medications, verified doses & any changes, verified new medications, reviewed OTC medications, reviewed OTC health products-vitamins supplements etc Complications: none Dietary changes: none   Health status changes: none   Lifestyle changes: none   Recent/future hospitalizations: none   Recent/future procedures: none   Recent/future dental: none Patient Assessment Part 2:  Have you MISSED ANY DOSES or CHANGED TABLETS?  No missed Warfarin doses or changed tablets.  Have you had any BRUISING or BLEEDING ( nose or gum bleeds,blood in urine or stool)?  No reported bruising or bleeding in nose, gums, urine, stool.  Have you STARTED or STOPPED any MEDICATIONS, including OTC meds,herbals or supplements?  No other medications or herbal supplements were started or stopped.  Have you CHANGED your DIET, especially green vegetables,or ALCOHOL intake?  No changes in diet or alcohol intake.  Have you had any ILLNESSES or HOSPITALIZATIONS?  No reported illnesses or hospitalizations  Have you had any signs of CLOTTING?(chest discomfort,dizziness,shortness of breath,arms tingling,slurred speech,swelling or redness in leg)    No chest discomfort, dizziness, shortness of breath, tingling in arm, slurred speech, swelling, or redness in leg.     Treatment  Target INR: 2.0-2.5 INR: 1.9  Date: 03/05/2010 Regimen In:  27.5mg /week INR reflects regimen in: 1.9  New  Tablet strength: : 5mg  Regimen Out:     Sunday: 1  Tablet     Monday: 1 Tablet     Tuesday: 1 Tablet     Wednesday: 1/2 Tablet     Thursday: 1 Tablet      Friday: 1 Tablet     Saturday: 1 Tablet Total Weekly: 32.5mg /week mg  Next INR Due: 04/02/2010 Adjusted by: Barbera Setters. Alexandria Lodge III PharmD CACP   Return to anticoagulation clinic:  04/02/2010 Time of next visit: 0900    Allergies: No Known Drug Allergies

## 2010-03-29 NOTE — Progress Notes (Signed)
Summary: lost instructions/ hla  Phone Note Call from Patient   Summary of Call: pt called, has misplaced her instructions from [facility] groce, instructions given, pt repeated back and stated she wrote them down, appt w/ Vonna Kotyk set 1/9 Initial call taken by: Marin Roberts RN,  February 08, 2010 12:37 PM

## 2010-03-29 NOTE — Progress Notes (Signed)
Summary: refill/gg  Phone Note Refill Request  on February 05, 2010 4:54 PM  Refills Requested: Medication #1:  SINGULAIR 10 MG TABS Take 1 tablet by mouth once a day   Last Refilled: 01/20/2010  Method Requested: Electronic Initial call taken by: Merrie Roof RN,  February 05, 2010 4:54 PM    Prescriptions: SINGULAIR 10 MG TABS (MONTELUKAST SODIUM) Take 1 tablet by mouth once a day  #90 x 3   Entered and Authorized by:   Ulyess Mort MD   Signed by:   Ulyess Mort MD on 02/07/2010   Method used:   Electronically to        [facility] [phone]* (retail)       [address]       [place], [place]  [address]       Ph: [phone]       Fax: [phone]   RxID:   [id number]

## 2010-03-29 NOTE — Assessment & Plan Note (Signed)
Summary: FU VISIT/DS   Vital Signs:  Patient profile:   77 year old female Height:      62 inches (157.48 cm) Weight:      305.9 pounds (139.05 kg) BMI:     56.15 Temp:     97.0 degrees F (36.11 degrees C) oral Pulse rate:   58 / minute BP sitting:   141 / 55  (right arm) Cuff size:   wrist  Vitals Entered By: Cynda Familia Duncan Dull) (March 09, 2010 10:56 AM)  Primary Care Provider:  Ulyess Mort MD   History of Present Illness: 16 woman with morbid obesity and COPD. Complaints concern some foot discomfort -- she has regular podiatry appointments. Has gained a few more lbs but denies any change in orthopnea or DOE. Bed and chair bound with home O2. Decided to stop fluoxetine several weeks ago because of "fuzzy thinking" and believes she feels better. Does report early awaking which antedated stopping fluoxetine.  Will try ambien. Also reports symptoms of UTI.   Diabetic Foot Exam Foot Inspection Can the patient see the bottom of their feet?          No Are the shoes appropriate in style and fit?          Yes  Diabetic Foot Care Education Patient educated on appropriate care of diabetic feet.   High Risk Feet? Yes   10-g (5.07) Semmes-Weinstein Monofilament Test Performed by: Lynn Ito          Right Foot          Left Foot Test Control                  Site 1         abnormal         abnormal Site 2         abnormal         abnormal Site 3         abnormal         abnormal Site 4         abnormal         abnormal Site 5         abnormal         abnormal Site 6         abnormal         abnormal  Impression      abnormal         abnormal  Allergies: No Known Drug Allergies  Diabetes Management Exam:    Foot Exam (with socks and/or shoes not present):       Sensory-Monofilament:          Left foot: abnormal          Right foot: abnormal   Impression & Recommendations:  Problem # 1:  DIABETES MELLITUS (ICD-250.00) Uncertain.  A1c = 7.  Certainly at  risk. Will not push this aggressively.  Recheck A1c from time to time.   Her updated medication list for this problem includes:    Cozaar 50 Mg Tabs (Losartan potassium) .Marland Kitchen... Take 1 tablet by mouth twice a day  Problem # 2:  DYSURIA (ICD-788.1) UA:  positive dipstick. Bactrim.  Contact Dr. Alexandria Lodge to adjust warfarin.   Her updated medication list for this problem includes:    Detrol La 2 Mg Xr24h-cap (Tolterodine tartrate) .Marland Kitchen... Take 1 capsule by mouth once a day    Sulfamethoxazole-trimethoprim 400-80 Mg Tabs (Sulfamethoxazole-trimethoprim) .Marland Kitchen... Take 1  tablet by mouth two times a day  Problem # 3:  COPD (ICD-496) No change.  On maximum rx and is satisfied with her stability.   Her updated medication list for this problem includes:    Proventil Hfa 108 (90 Base) Mcg/act Aers (Albuterol sulfate) .Marland Kitchen... 1 to 2 puffs two times a day as needed for wheezing.    Advair Diskus 250-50 Mcg/dose Misc (Fluticasone-salmeterol) .Marland Kitchen... Twice a day    Spiriva Handihaler 18 Mcg Caps (Tiotropium bromide monohydrate) ..... Once a day    Singulair 10 Mg Tabs (Montelukast sodium) .Marland Kitchen... Take 1 tablet by mouth once a day  Problem # 4:  CONGESTIVE HEART FAILURE (ICD-428.0) Weight is up 3 more lbs, but was up > 20 lbs between 2010 and 10- 2011. Only trace edema.  She denies orthopnea, PND, or chest pain.   Her updated medication list for this problem includes:    Lasix 40 Mg Tabs (Furosemide) .Marland Kitchen... Take two tabs daily    Spironolactone 25 Mg Tabs (Spironolactone) .Marland Kitchen... Take 1 tablet by mouth once a day.    Cozaar 50 Mg Tabs (Losartan potassium) .Marland Kitchen... Take 1 tablet by mouth twice a day    Warfarin Sodium 5 Mg Tabs (Warfarin sodium) .Marland Kitchen... Take as directed.  Problem # 5:  Preventive Health Care (ICD-V70.0) She apparantly gets mammograms. Other cancer screening inappropriate.  She is 4 with very severe chronic illnesses.  Complete Medication List: 1)  Lipitor 40 Mg Tabs (Atorvastatin calcium) .... Take  1 tablet by mouth once a day 2)  Levothroid 175 Mcg Tabs (Levothyroxine sodium) .... Take 1 tablet by mouth once a day 3)  Proventil Hfa 108 (90 Base) Mcg/act Aers (Albuterol sulfate) .Marland Kitchen.. 1 to 2 puffs two times a day as needed for wheezing. 4)  Advair Diskus 250-50 Mcg/dose Misc (Fluticasone-salmeterol) .... Twice a day 5)  Spiriva Handihaler 18 Mcg Caps (Tiotropium bromide monohydrate) .... Once a day 6)  Singulair 10 Mg Tabs (Montelukast sodium) .... Take 1 tablet by mouth once a day 7)  Fluticasone Propionate 50 Mcg/act Susp (Fluticasone propionate) .Marland Kitchen.. 1 spray each nostril two times a day 8)  Lasix 40 Mg Tabs (Furosemide) .... Take two tabs daily 9)  Spironolactone 25 Mg Tabs (Spironolactone) .... Take 1 tablet by mouth once a day. 10)  Amlodipine Besylate 5 Mg Tabs (Amlodipine besylate) .... Take 1 tablet by mouth once a day 11)  Cozaar 50 Mg Tabs (Losartan potassium) .... Take 1 tablet by mouth twice a day 12)  Warfarin Sodium 5 Mg Tabs (Warfarin sodium) .... Take as directed. 13)  Detrol La 2 Mg Xr24h-cap (Tolterodine tartrate) .... Take 1 capsule by mouth once a day 14)  Omeprazole 20 Mg Cpdr (Omeprazole) .... Take 1 capsule by mouth two times a day 15)  Reglan 5 Mg Tabs (Metoclopramide hcl) .... Take 1 tablet by mouth three times a day before meals and at bedtime. 16)  Miralax Powd (Polyethylene glycol 3350) .... Use as directed 17)  Tylenol Extra Strength 500 Mg Tabs (Acetaminophen) .... Take 2 tablets by mouth two times a day 18)  Ambien 10 Mg Tabs (Zolpidem tartrate) .... Take 1 tablet by mouth at bedtime 19)  Sulfamethoxazole-trimethoprim 400-80 Mg Tabs (Sulfamethoxazole-trimethoprim) .... Take 1 tablet by mouth two times a day  Other Orders: T-Urinalysis Dipstick only (16109UE) T-Urinalysis ([id number])  Patient Instructions: 1)  Please schedule a follow-up appointment in 3 months. Prescriptions: SULFAMETHOXAZOLE-TRIMETHOPRIM 400-80 MG TABS (SULFAMETHOXAZOLE-TRIMETHOPRIM)  Take 1 tablet by mouth two times a  day  #14 x 0   Entered and Authorized by:   Ulyess Mort MD   Signed by:   Ulyess Mort MD on 03/09/2010   Method used:   Electronically to        [facility] [phone]* (retail)       [address]       [place], [place]  [address]       Ph: [phone]       Fax: [phone]   RxID:   [id number] AMBIEN 10 MG TABS (ZOLPIDEM TARTRATE) Take 1 tablet by mouth at bedtime  #31 x 2   Entered and Authorized by:   Ulyess Mort MD   Signed by:   Ulyess Mort MD on 03/09/2010   Method used:   Telephoned to ...       [facility] [address] [phone]* (retail)       [address]       [place], [place]  [address]       Ph: [phone]       Fax: [phone]   RxID:   [id number]    Orders Added: 1)  Est. Patient Level III [30160] 2)  T-Urinalysis Dipstick only [81003QW] 3)  T-Urinalysis [[id number]]     Prevention & Chronic Care Immunizations   Influenza vaccine: Fluvax 3+  (12/05/2009)   Influenza vaccine deferral: Not indicated  (08/25/2008)    Tetanus booster: Not documented   Td booster deferral: Deferred  (08/25/2008)    Pneumococcal vaccine: Not documented   Pneumococcal vaccine deferral: Deferred  (08/25/2008)    H. zoster vaccine: Not documented   H. zoster vaccine deferral: Deferred  (08/25/2008)  Colorectal Screening   Hemoccult: Not documented   Hemoccult action/deferral: Not indicated  (05/19/2009)    Colonoscopy: Results: Polyp.  Location:  [facility].    (05/27/2001)   Colonoscopy action/deferral: Not indicated  (05/19/2009)   Colonoscopy due: 05/28/2006  Other Screening   Pap smear: Not documented   Pap smear action/deferral: Not indicated-other  (08/25/2008)    Mammogram: Not documented    DXA bone density scan: Not documented   DXA bone density action/deferral: Deferred  (08/25/2008)   Smoking status: quit  (12/05/2009)  Diabetes Mellitus   HgbA1C: 7.0  (01/01/2010)    Eye exam:  Not documented    Foot exam: yes  (03/09/2010)   Foot exam action/deferral: Do today   High risk foot: Yes  (03/09/2010)   Foot care education: Done  (03/09/2010)    Urine microalbumin/creatinine ratio: 48.0  (05/20/2009)   Urine microalbumin action/deferral: Ordered  Lipids   Total Cholesterol: 131  (06/22/2007)   Lipid panel action/deferral: Not indicated   LDL: 107  --  01/23/2008  (03/08/2008)   LDL Direct: Not documented   HDL: 51  (06/22/2007)   Triglycerides: 86  (06/22/2007)    SGOT (AST): 26  (08/25/2008)   BMP action: Not indicated   SGPT (ALT): 16  (08/25/2008)   Alkaline phosphatase: 128  (08/25/2008)   Total bilirubin: 0.5  (08/25/2008)  Hypertension   Last Blood Pressure: 141 / 55  (03/09/2010)   Serum creatinine: 1.17  (01/01/2010)   Serum potassium 5.0  (01/01/2010)  Self-Management Support :   Personal Goals (by the next clinic visit) :      Personal blood pressure goal: 130/80  (07/18/2009)     Personal LDL goal: 100  (07/18/2009)    Diabetes self-management support: Pre-printed educational material, Resources for patients handout  (07/18/2009)  Hypertension self-management support: Pre-printed educational material, Resources for patients handout  (07/18/2009)    Lipid self-management support: Pre-printed educational material, Resources for patients handout  (07/18/2009)    Nursing Instructions: Diabetic foot exam today   Process Orders Check Orders Results:     [facility]: Check successful Tests Sent for requisitioning (March 13, 2010 2:08 PM):     03/09/2010: [facility] -- T-Urinalysis [[id number]] (signed)     Laboratory Results   Urine Tests  Date/Time Received: Cynda Familia [facility])  March 09, 2010 12:18 PM  Date/Time Reported: Cynda Familia [facility])  March 09, 2010 12:18 PM   Routine Urinalysis   Color: yellow Appearance: Cloudy Glucose: negative   (Normal Range:  Negative) Bilirubin: negative   (Normal Range: Negative) Ketone: negative   (Normal Range: Negative) Spec. Gravity: 1.010   (Normal Range: 1.003-1.035) Blood: moderate   (Normal Range: Negative) pH: 5.0   (Normal Range: 5.0-8.0) Protein: negative   (Normal Range: Negative) Urobilinogen: 0.2   (Normal Range: 0-1) Nitrite: positive   (Normal Range: Negative) Leukocyte Esterace: small   (Normal Range: Negative)

## 2010-03-29 NOTE — Progress Notes (Signed)
Summary: refill/ hla  Phone Note Refill Request Message from:  Pharmacy on March 02, 2010 12:01 PM  Refills Requested: Medication #1:  AMLODIPINE BESYLATE 5 MG TABS Take 1 tablet by mouth once a day   Dosage confirmed as above?Dosage Confirmed   Last Refilled: 12/9 Initial call taken by: Marin Roberts RN,  March 02, 2010 12:01 PM    Prescriptions: AMLODIPINE BESYLATE 5 MG TABS (AMLODIPINE BESYLATE) Take 1 tablet by mouth once a day  #31 x 11   Entered and Authorized by:   Ulyess Mort MD   Signed by:   Ulyess Mort MD on 03/02/2010   Method used:   Electronically to        [facility] [phone]* (retail)       [address]       [place], [place]  [address]       Ph: [phone]       Fax: [phone]   RxID:   [id number]

## 2010-03-30 ENCOUNTER — Telehealth: Payer: Self-pay | Admitting: *Deleted

## 2010-03-30 NOTE — Telephone Encounter (Addendum)
Pt calls c/o uti not clearing, having same symptoms, thinks the abx given 1/13 was not strong enough. She also ask about her Remus Loffler, i called [facility] and gave the script from last visit to pharmacist, they had no record of it Please advise  Spoke w/ pt she is coming to clinic to see dr groce mon and will give a specimen then

## 2010-03-30 NOTE — Telephone Encounter (Signed)
Please arrange to collect a clean catch urine (at least a day after stopping the current antibiotic).  Send for UA and C & S. Send me the results next week.

## 2010-04-02 ENCOUNTER — Ambulatory Visit (INDEPENDENT_AMBULATORY_CARE_PROVIDER_SITE_OTHER): Payer: Medicare Other | Admitting: Pharmacist

## 2010-04-02 ENCOUNTER — Telehealth: Payer: Self-pay | Admitting: *Deleted

## 2010-04-02 ENCOUNTER — Other Ambulatory Visit: Payer: Self-pay | Admitting: Internal Medicine

## 2010-04-02 DIAGNOSIS — I4891 Unspecified atrial fibrillation: Secondary | ICD-10-CM

## 2010-04-02 DIAGNOSIS — N390 Urinary tract infection, site not specified: Secondary | ICD-10-CM

## 2010-04-02 DIAGNOSIS — Z7901 Long term (current) use of anticoagulants: Secondary | ICD-10-CM

## 2010-04-02 LAB — POCT INR: INR: 4.9

## 2010-04-02 NOTE — Telephone Encounter (Signed)
Pt here for [facility]mad[facility].  UTI symptoms.  Temps between 99 and 100.  [place] Spaniel that feels that previous infection was not cleared.  Said that she took the last antibiotic 10 days ago.  Attempted to do a clean catch urine.  No success.  Cath UA was done per order of Dr. Aundria Rud.

## 2010-04-02 NOTE — Progress Notes (Signed)
Patient endorses dysuria/pyuria with fever.  She is to be seen by Dr. Aundria Rud. Her dose of warfarin (weekly) has been reduced and she is OMITTING today's dose.

## 2010-04-02 NOTE — Patient Instructions (Signed)
Patient instructed to OMIT/HOLD today's dose. She will be decreased to 27.5mg /wk regimen.

## 2010-04-03 ENCOUNTER — Other Ambulatory Visit: Payer: Self-pay | Admitting: Internal Medicine

## 2010-04-03 LAB — URINALYSIS
Bilirubin Urine: NEGATIVE
Protein, ur: NEGATIVE mg/dL
Specific Gravity, Urine: 1.02 (ref 1.005–1.030)
Urine Glucose, Fasting: NEGATIVE mg/dL
pH: 5 (ref 5.0–8.0)

## 2010-04-03 MED ORDER — LOSARTAN POTASSIUM 50 MG PO TABS: 50.0000 mg | ORAL_TABLET | Freq: Two times a day (BID) | ORAL | Status: DC

## 2010-04-04 ENCOUNTER — Ambulatory Visit: Payer: Medicare Other

## 2010-04-04 LAB — URINE CULTURE: Colony Count: >=100000

## 2010-04-05 ENCOUNTER — Telehealth: Payer: Self-pay | Admitting: Internal Medicine

## 2010-04-05 MED ORDER — NITROFURANTOIN MONOHYD MACRO 100 MG PO CAPS: 100.0000 mg | ORAL_CAPSULE | Freq: Two times a day (BID) | ORAL | Status: AC

## 2010-04-05 NOTE — Telephone Encounter (Signed)
Pt has been informed and will get meds

## 2010-04-05 NOTE — Telephone Encounter (Signed)
Med sent in for UTI.  Please call patient.

## 2010-04-09 ENCOUNTER — Ambulatory Visit (INDEPENDENT_AMBULATORY_CARE_PROVIDER_SITE_OTHER): Payer: Medicare Other | Admitting: Pharmacist

## 2010-04-09 DIAGNOSIS — Z7901 Long term (current) use of anticoagulants: Secondary | ICD-10-CM

## 2010-04-09 DIAGNOSIS — I4891 Unspecified atrial fibrillation: Secondary | ICD-10-CM

## 2010-04-09 LAB — POCT INR: INR: 1.8

## 2010-04-09 NOTE — Progress Notes (Signed)
Anti-Coagulation Progress Note  [name] is a 77 y.o. female who is currently on an anti-coagulation regimen.    RECENT RESULTS: Recent results are below, the most recent result is correlated with a dose of 27.5 mg. per week: Lab Results  Component Value Date   INR 1.8 04/09/2010   INR 4.9 04/02/2010   INR 1.9 03/05/2010    ANTI-COAG DOSE:   Latest dosing instructions   Total Sun Mon Tue Wed Thu Fri Sat   32.5 5 mg 5 mg 5 mg 2.5 mg 5 mg 5 mg 5 mg    (5 mg1) (5 mg1) (5 mg1) (5 mg0.5) (5 mg1) (5 mg1) (5 mg1)         ANTICOAG SUMMARY: Anticoagulation Episode Summary              Current INR goal 2.0-3.0 Next INR check 04/23/2010   INR from last check 1.8! (04/09/2010)     Weekly max dose (mg)  Target end date Indefinite   Indications Long-term (current) use of anticoagulants, AF (atrial fibrillation)   INR check location  Preferred lab    Send INR reminders to [facility]   Comments        Provider Role Specialty Phone number   Ulyess Mort  Internal Medicine [phone]        ANTICOAG TODAY: Anticoagulation Summary as of 04/09/2010              INR goal 2.0-3.0     Selected INR 1.8! (04/09/2010) Next INR check 04/23/2010   Weekly max dose (mg)  Target end date Indefinite   Indications Long-term (current) use of anticoagulants, AF (atrial fibrillation)    Anticoagulation Episode Summary              INR check location  Preferred lab    Send INR reminders to [facility]   Comments        Provider Role Specialty Phone number   Ulyess Mort  Internal Medicine [phone]        PATIENT INSTRUCTIONS: Patient Instructions  Patient instructed to take medications as defined in the Anti-coagulation Track section of this encounter.  Patient instructed to take today's dose.  Patient verbalized understanding of these instructions.         FOLLOW-UP Return in 2 weeks (on 04/23/2010) for Follow up INR.  Hulen Luster, III Pharm.D., CACP

## 2010-04-09 NOTE — Patient Instructions (Signed)
Patient instructed to take medications as defined in the Anti-coagulation Track section of this encounter.  Patient instructed to take today's dose.  Patient verbalized understanding of these instructions.    

## 2010-04-09 NOTE — Progress Notes (Signed)
Reviewed patient's PT/INR results and agree with treatment plan.  

## 2010-04-19 ENCOUNTER — Telehealth: Payer: Self-pay | Admitting: *Deleted

## 2010-04-19 NOTE — Telephone Encounter (Signed)
Pt calls and states recently she has been waking up itching several mornings a week, this has been happening for appr 2 wks, she states she has recently had an abx and started [place], wonders if one of these could cause it? i told her we would ask dr Aundria Rud and i would call her back. She finished her abx a few days ago. Denies new foods, new soaps, new detergents.

## 2010-04-20 NOTE — Telephone Encounter (Signed)
Could be an allergy to nitrofurantoin.  I have listed this in EPIC.  Please notify patient. Nothing else to be done since she is finished with the med.

## 2010-04-23 ENCOUNTER — Ambulatory Visit: Payer: Medicare Other

## 2010-04-23 NOTE — Telephone Encounter (Signed)
Will call pt and check on her

## 2010-04-28 ENCOUNTER — Other Ambulatory Visit: Payer: Self-pay | Admitting: Internal Medicine

## 2010-04-30 ENCOUNTER — Ambulatory Visit (INDEPENDENT_AMBULATORY_CARE_PROVIDER_SITE_OTHER): Payer: Medicare Other | Admitting: Pharmacist

## 2010-04-30 DIAGNOSIS — Z7901 Long term (current) use of anticoagulants: Secondary | ICD-10-CM

## 2010-04-30 DIAGNOSIS — I4891 Unspecified atrial fibrillation: Secondary | ICD-10-CM

## 2010-04-30 MED ORDER — WARFARIN SODIUM 5 MG PO TABS: ORAL_TABLET | ORAL | Status: DC

## 2010-04-30 NOTE — Progress Notes (Signed)
Anti-Coagulation Progress Note  [name] is a 76 y.o. female who is currently on an anti-coagulation regimen.    RECENT RESULTS: Recent results are below, the most recent result is correlated with a dose of 32.5 mg. per week: Lab Results  Component Value Date   INR 3.8 04/30/2010   INR 1.8 04/09/2010   INR 4.9 04/02/2010    ANTI-COAG DOSE:   Latest dosing instructions   Total Sun Mon Tue Wed Thu Fri Sat   30 5 mg 2.5 mg 5 mg 2.5 mg 5 mg 5 mg 5 mg    (5 mg1) (5 mg0.5) (5 mg1) (5 mg0.5) (5 mg1) (5 mg1) (5 mg1)         ANTICOAG SUMMARY: Anticoagulation Episode Summary              Current INR goal 2.0-3.0 Next INR check 05/28/2010   INR from last check 3.8! (04/30/2010)     Weekly max dose (mg)  Target end date Indefinite   Indications Long-term (current) use of anticoagulants, AF (atrial fibrillation)   INR check location  Preferred lab    Send INR reminders to [facility]   Comments        Provider Role Specialty Phone number   Ulyess Mort  Internal Medicine [phone]        ANTICOAG TODAY: Anticoagulation Summary as of 04/30/2010              INR goal 2.0-3.0     Selected INR 3.8! (04/30/2010) Next INR check 05/28/2010   Weekly max dose (mg)  Target end date Indefinite   Indications Long-term (current) use of anticoagulants, AF (atrial fibrillation)    Anticoagulation Episode Summary              INR check location  Preferred lab    Send INR reminders to [facility]   Comments        Provider Role Specialty Phone number   Ulyess Mort  Internal Medicine [phone]        PATIENT INSTRUCTIONS: Patient Instructions  Patient instructed to take medications as defined in the Anti-coagulation Track section of this encounter.  Patient instructed to omit today's dose.  Patient verbalized understanding of these instructions.        FOLLOW-UP Return in 4 weeks (on 05/28/2010) for Follow up INR.  Hulen Luster, III Pharm.D., CACP

## 2010-04-30 NOTE — Patient Instructions (Signed)
Patient instructed to take medications as defined in the Anti-coagulation Track section of this encounter.  Patient instructed to omit today's dose.  Patient verbalized understanding of these instructions.    

## 2010-05-01 ENCOUNTER — Encounter: Payer: Self-pay | Admitting: Ophthalmology

## 2010-05-07 ENCOUNTER — Telehealth: Payer: Self-pay | Admitting: *Deleted

## 2010-05-07 ENCOUNTER — Encounter: Payer: Self-pay | Admitting: Internal Medicine

## 2010-05-07 ENCOUNTER — Emergency Department (HOSPITAL_COMMUNITY): Payer: Medicare Other

## 2010-05-07 ENCOUNTER — Inpatient Hospital Stay (HOSPITAL_COMMUNITY)
Admission: EM | Admit: 2010-05-07 | Discharge: 2010-05-08 | DRG: 313 | Disposition: A | Discharge status: Home / Self Care | Payer: Medicare Other | Attending: Internal Medicine | Admitting: Internal Medicine

## 2010-05-07 DIAGNOSIS — Z7901 Long term (current) use of anticoagulants: Secondary | ICD-10-CM

## 2010-05-07 DIAGNOSIS — E039 Hypothyroidism, unspecified: Secondary | ICD-10-CM | POA: Diagnosis present

## 2010-05-07 DIAGNOSIS — J9610 Chronic respiratory failure, unspecified whether with hypoxia or hypercapnia: Secondary | ICD-10-CM | POA: Diagnosis present

## 2010-05-07 DIAGNOSIS — R0789 Other chest pain: Principal | ICD-10-CM | POA: Diagnosis present

## 2010-05-07 DIAGNOSIS — R32 Unspecified urinary incontinence: Secondary | ICD-10-CM | POA: Diagnosis present

## 2010-05-07 DIAGNOSIS — I509 Heart failure, unspecified: Secondary | ICD-10-CM | POA: Diagnosis present

## 2010-05-07 DIAGNOSIS — Z86711 Personal history of pulmonary embolism: Secondary | ICD-10-CM

## 2010-05-07 DIAGNOSIS — I5032 Chronic diastolic (congestive) heart failure: Secondary | ICD-10-CM | POA: Diagnosis present

## 2010-05-07 DIAGNOSIS — K219 Gastro-esophageal reflux disease without esophagitis: Secondary | ICD-10-CM | POA: Diagnosis present

## 2010-05-07 DIAGNOSIS — I4891 Unspecified atrial fibrillation: Secondary | ICD-10-CM | POA: Diagnosis present

## 2010-05-07 DIAGNOSIS — Z86718 Personal history of other venous thrombosis and embolism: Secondary | ICD-10-CM

## 2010-05-07 LAB — COMPREHENSIVE METABOLIC PANEL
Alkaline Phosphatase: 79 U/L (ref 39–117)
BUN: 29 mg/dL — ABNORMAL HIGH (ref 6–23)
Glucose, Bld: 119 mg/dL — ABNORMAL HIGH (ref 70–99)
Potassium: 4.4 mEq/L (ref 3.5–5.1)
Total Bilirubin: 0.4 mg/dL (ref 0.3–1.2)
Total Protein: 7.2 g/dL (ref 6.0–8.3)

## 2010-05-07 LAB — CBC
Hemoglobin: 14 g/dL (ref 12.0–15.0)
MCH: 29.7 pg (ref 26.0–34.0)
MCHC: 32.2 g/dL (ref 30.0–36.0)

## 2010-05-07 LAB — DIFFERENTIAL
Basophils Relative: 1 % (ref 0–1)
Eosinophils Absolute: 0.2 10*3/uL (ref 0.0–0.7)
Eosinophils Relative: 2 % (ref 0–5)
Monocytes Absolute: 0.7 10*3/uL (ref 0.1–1.0)
Monocytes Relative: 7 % (ref 3–12)
Neutro Abs: 4.3 10*3/uL (ref 1.7–7.7)

## 2010-05-07 LAB — PROTIME-INR: Prothrombin Time: 25.2 seconds — ABNORMAL HIGH (ref 11.6–15.2)

## 2010-05-07 LAB — POCT CARDIAC MARKERS

## 2010-05-07 LAB — BRAIN NATRIURETIC PEPTIDE: Pro B Natriuretic peptide (BNP): 126 pg/mL — ABNORMAL HIGH (ref 0.0–100.0)

## 2010-05-07 LAB — TROPONIN I: Troponin I: 0.19 ng/mL — ABNORMAL HIGH (ref 0.00–0.06)

## 2010-05-07 LAB — CK TOTAL AND CKMB (NOT AT ARMC)
CK, MB: 2.1 ng/mL (ref 0.3–4.0)
Relative Index: INVALID (ref 0.0–2.5)

## 2010-05-07 NOTE — H&P (Signed)
Hospital Admission Note Date: 05/07/2010  Patient name: [name] Medical record number: [id number] Date of birth: 1932-12-11 Age: 77 y.o. Gender: female PCP: Ulyess Mort, MD  Medical Service: Internal Medicine Teaching Service  Attending physician: Dr. Rogelia Boga    Pager: Resident [phone]): Dr. Baltazar Apo    Pager:[phone] Resident (R1):          Dr. Anselm Jungling     Pager:[phone]  Chief Complaint:chest pain  History of Present Illness:77 yo woman with a complex past medical history stated below presents to the ED for chest pain that started at [facility] that woke her up on the day of admission; however, it was not bad enough so she went back to sleep.  She also had another episode like that the night prior as well.  She reports that she gets similar chest pain episodes every once in a while and this is not new.  She states that her chest pain is intermittent and it is dull.  Location is left-sided chest, lasting several minutes, 7/10 in severity, no radiation, + sweating and chills.  No allevation or exacerbation factors.  She denies any SOB, fever.  She has a history of recurrent UTI in which she just finished a course of abx 2-3 weeks ago.  Current Outpatient Prescriptions  Medication Sig Dispense Refill  . acetaminophen (TYLENOL) 500 MG tablet Take 500 mg by mouth. Take two tablets by mouth two times a day       . albuterol (PROVENTIL HFA) 108 (90 BASE) MCG/ACT inhaler Inhale 2 puffs into the lungs every 6 (six) hours as needed. 1 to 2 puffs two times a day as needed for wheezing       . amLODipine (NORVASC) 5 MG tablet Take 5 mg by mouth daily.        Marland Kitchen COLCRYS 0.6 MG tablet       . FLUoxetine (PROZAC) 20 MG capsule Take 20 mg by mouth 2 (two) times daily.        . Fluticasone-Salmeterol (ADVAIR DISKUS) 250-50 MCG/DOSE AEPB Inhale 1 puff into the lungs 2 (two) times daily.        . furosemide (LASIX) 40 MG tablet Take 40 mg by mouth daily. Take two tablets daily       . HYDROcodone-acetaminophen  (NORCO) 5-325 MG per tablet       . levothyroxine (SYNTHROID, LEVOTHROID) 175 MCG tablet Take 175 mcg by mouth daily.        Marland Kitchen LIPITOR 40 MG tablet TAKE ONE TABLET BY MOUTH EVERY DAY  31 each  6  . losartan (COZAAR) 50 MG tablet Take 1 tablet (50 mg total) by mouth 2 (two) times daily.  60 tablet  6  . metoclopramide (REGLAN) 5 MG tablet Take 5 mg by mouth. Take 1 tablet by mouth three times a day before meals and bedtime       . omeprazole (PRILOSEC) 20 MG capsule Take 20 mg by mouth 2 (two) times daily.        . polyethylene glycol (MIRALAX) powder Take 17 g by mouth daily. Use as directed       . SINGULAIR 10 MG tablet TAKE ONE TABLET BY MOUTH EVERY DAY  31 each  6  . spironolactone (ALDACTONE) 25 MG tablet Take 25 mg by mouth daily.        Marland Kitchen tiotropium (SPIRIVA) 18 MCG inhalation capsule Place 18 mcg into inhaler and inhale daily.        Marland Kitchen tolterodine (DETROL LA) 2  MG 24 hr capsule Take 2 mg by mouth daily.        Marland Kitchen warfarin (COUMADIN) 5 MG tablet Take as directed  31 tablet  3  . zolpidem (AMBIEN) 10 MG tablet         Allergies: Nitrofurantoin  Past Medical History  Diagnosis Date  . CAD (coronary artery disease)     s/p remote MI (20+ yrs ago)  . Abdominal pain     recent admission, likely secondary to presbyesophagus and gastric dysmotility  . AF (atrial fibrillation)     on chronic warfarin  . CVD (cardiovascular disease)   . Diastolic dysfunction     with dilated cardiomyopathy, last EF 55%(09/2006)  . Respiratory failure, chronic     mixed etiology with bronchospastic component  . Urinary incontinence     recurrent uti's/resistance to cipro, bactrim  . Gout   . Hypothyroidism   . Hyperlipidemia   . Depression   . Osteoarthritis   . Morbidly obese     s/p gastric bypass  . Gastroesophageal reflux disease   . Ventral hernia     repair in April 2008, complicated by MRSA abdominal wall cellulitis  . DVT (deep venous thrombosis)   . Pulmonary embolism     1998, s/p  Greenfield filter  . Diabetes mellitus   Had a stroke 40yrs ago  Past Surgical History  Procedure Date  . Hernia repair   . Cholecystectomy   . Appendectomy   . Breast biopsy     Family History  Problem Relation Age of Onset  . Colon cancer Neg Hx   Mother and Father: both had stroke  History   Social History  . Marital Status: Widowed    Spouse Name: N/A    Number of Children: N/A  . Years of Education: N/A   Occupational History  . Not on file.   Social History Main Topics  . Smoking status: Former Games developer  . Smokeless tobacco: Not on file  . Alcohol Use: No  . Drug Use: No  . Sexually Active: Not on file   Other Topics Concern  . Not on file   Social History Narrative   She lives w/her son. Her son offers help and she has an aid who occasionally cooks for her and takes care of some other tasks around the pt's house.Has medicare. Has son in [place] who is POAAmbivalent about DNR issues and does not want a STOP sign posted in home    Review of Systems: Pertinent items are noted in HPI.  Physical Exam: T 98.2, BP 124/44, HR 79, RR 15, O2 sat 100% RA  General appearance: alert, cooperative and appears stated age Head: Normocephalic, without obvious abnormality, atraumatic Eyes: conjunctivae/corneas clear. PERRL, EOM's intact. Fundi benign. Neck: no adenopathy, no carotid bruit, no JVD, supple, symmetrical, trachea midline and thyroid not enlarged, symmetric, no tenderness/mass/nodules Lungs: clear to auscultation bilaterally Chest wall: no tenderness Heart: regular rate and rhythm, S1, S2 normal, no murmur, click, rub or gallop, correction (irregular rhythm) Abdomen: soft, non-tender; bowel sounds normal; no masses,  no organomegaly Extremities: +1 edema, no cyanosis Pulses: 2+ and symmetric Neurologic: Alert and oriented X 3, normal strength and tone. Normal symmetric reflexes. Normal coordination and gait  Lab results:  WBC  9.7               4.0-10.5         K/uL  RBC                                      4.71              3.87-5.11        MIL/uL  Hemoglobin (HGB)                         14.0              12.0-15.0        g/dL  Hematocrit (HCT)                         43.5              36.0-46.0        %  MCV                                      92.4              78.0-100.0       fL  MCH -                                    29.7              26.0-34.0        pg  MCHC                                     32.2              30.0-36.0        g/dL  RDW                                      14.0              11.5-15.5        %  Platelet Count (PLT)                     245               150-400          K/uL  Neutrophils, %                           45                43-77            %  Lymphocytes, %                           46                12-46            %  Monocytes, %  7                 3-12             %  Eosinophils, %                           2                 0-5              %  Basophils, %                             1                 0-1              %  Neutrophils, Absolute                    4.3               1.7-7.7          K/uL  Lymphocytes, Absolute                    4.4        h      0.7-4.0          K/uL  Monocytes, Absolute                      0.7               0.1-1.0          K/uL  Eosinophils, Absolute                    0.2               0.0-0.7          K/uL  Basophils, Absolute                      0.1               0.0-0.1          K/uL   Sodium (NA)                              138               135-145          mEq/L  Potassium (K)                            4.4               3.5-5.1          mEq/L  Chloride                                 98                96-112           mEq/L  CO2  30                19-32            mEq/L  Glucose                                  119        h      70-99            mg/dL  BUN                                       29         h      6-23             mg/dL  Creatinine                               1.11              0.4-1.2          mg/dL  GFR, Est Non African American            48         l      >60              mL/min  GFR, Est African American                58         l      >60              mL/min    Oversized comment, see footnote  1  Bilirubin, Total                         0.4               0.3-1.2          mg/dL  Alkaline Phosphatase                     79                39-117           U/L  SGOT (AST)                               26                0-37             U/L  SGPT (ALT)                               27                0-35             U/L  Total  Protein                           7.2               6.0-8.3  g/dL  Albumin-Blood                            3.6               3.5-5.2          g/dL  Calcium                                  10.1              8.4-10.5         Mg/dL   Protime ( Prothrombin Time)              25.2       h      11.6-15.2        seconds  INR                                      2.27       h      0.00-1.49   CKMB, POC                                <1.0       l      1.0-8.0          ng/mL  Troponin I, POC                          <0.05             0.00-0.09        ng/mL  Myoglobin, POC                           96.8              12-200           Ng/mL  Beta Natriuretic Peptide                 126.0      h      0.0-100.0        Pg/mL Troponin I                               0.19       h      0.00-0.06        Ng/mL  Creatine Kinase, Total                   59                7-177            U/L  CK, MB                                   2.1               0.3-4.0          ng/mL  Relative Index  SEE NOTE.         0.0-2.5    Imaging results:   CXR 05/07/10: Stable chronic cardiomegaly and elevation of the right   hemidiaphragm. No acute cardiopulmonary abnormality.   Assessment & Plan by Problem: 1.  Chestpain- likely secondary to musculoskeletal or GERD, or gastroenteritis/gastroparesis.  However, need to rule out ACS given her risk factors.  Last cardiac catheterization 12/2004 showed patent coronary arteries with only a 30-40% area of luminal irregularities in the nondominant circumflex. 2Echo in 8/ 2008: EF 55%, Aortic valve thickness was moderately increased and there was mildly reduced aortic valve leaflet excursion. EKG does not show any new changes from previous EKGs, a-fibrillation with pulse 62. Furthermore, pt has hx of CHF and CHF exacerbations. BNP is mildly elevated today 126 with some mild lower extremity edema.  -Admit to telemetry -Cycle CEs x3 q 8h. Tro 0.19--> (of note patient's trop have been in this range in the past, with negative cardiac work-up, and MB is normal thus far)  -12 lead EKG in AM, 2D echo to evaluate EF -Will check TSH, BMP, CBC, lipase -strict I&Os, daily weights, fluid restriction to 1200cc per day -IV Lasix 60 mg x1 dose then will resume home dose  2. Atrial fibrillation- INR is therapeutic, will continue coumadin  3. HTN: Will continue Norvasc, Lasix 60mg  IV x 1 dose, then 40mg  po bid  4. HLD: continue lipitor 40mg  po qhs and will check FLP  5. Hypothyroidism: will check TSH   6. Chronic respiratory failure/asthma: continue Advair and Proventil   7. Urinary incontinence: continue Detrol LA 2mg  XR qhs  8. Hx of DVT/PE in 1998: s/p greenfield filter and also on Coumadin for a-fib  9. CHF - As mentioned in #1 patient has history of CHF exacerbation even with mild elevations of BNP. Once again patient presents with BNP of 126 with some mild lower extremity edema, however patient is not short of breath and lung exam was completely clear.  - will give IV lasix for today and resume home dose in AM  10. Elevated troponin - likely secondary to CHF, will continue to follow.       ATTENDING: I performed and/or observed a history and physical examination  of the patient.  I discussed the case with the residents as noted and reviewed the residents' notes.  I agree with the findings and plan--please refer to the attending physician note for more details.   Signature________________________________  Printed Name_____________________________

## 2010-05-07 NOTE — Telephone Encounter (Signed)
Pt has been called several time with no answer.  Will keep trying.

## 2010-05-07 NOTE — Telephone Encounter (Signed)
Pt called asking for appointment. She is c/o SOB X 5 days.  She also c/o chest pain on and off on 3/10 and 3/11. She has a hard time sleeping at night because SOB.  Denies nausea and diaphoresis. Pt [phone]

## 2010-05-07 NOTE — Telephone Encounter (Signed)
Have called pt again and unable to reach. Called pt's son and he said pt went to Ed for SOB

## 2010-05-07 NOTE — Telephone Encounter (Signed)
Patient has complicated history including A fib, CAD and diastolic CHF.  Pt would need to be seen.  She can go to [facility] or ED this afternoon for further eval.

## 2010-05-08 ENCOUNTER — Encounter: Payer: Self-pay | Admitting: Internal Medicine

## 2010-05-08 DIAGNOSIS — R079 Chest pain, unspecified: Secondary | ICD-10-CM

## 2010-05-08 DIAGNOSIS — R0602 Shortness of breath: Secondary | ICD-10-CM

## 2010-05-08 LAB — CARDIAC PANEL(CRET KIN+CKTOT+MB+TROPI)
Relative Index: INVALID (ref 0.0–2.5)
Troponin I: 0.17 ng/mL — ABNORMAL HIGH (ref 0.00–0.06)

## 2010-05-08 LAB — TSH: TSH: 0.155 u[IU]/mL — ABNORMAL LOW (ref 0.350–4.500)

## 2010-05-08 LAB — LIPID PANEL
HDL: 39 mg/dL — ABNORMAL LOW (ref >39)
LDL Cholesterol: 94 mg/dL (ref 0–99)
Total CHOL/HDL Ratio: 4.2 RATIO
Triglycerides: 145 mg/dL (ref <150)
VLDL: 29 mg/dL (ref 0–40)

## 2010-05-08 LAB — BASIC METABOLIC PANEL
BUN: 33 mg/dL — ABNORMAL HIGH (ref 6–23)
Calcium: 9.5 mg/dL (ref 8.4–10.5)
GFR calc non Af Amer: 45 mL/min — ABNORMAL LOW (ref >60)
Glucose, Bld: 146 mg/dL — ABNORMAL HIGH (ref 70–99)

## 2010-05-08 LAB — LIPASE, BLOOD: Lipase: 31 U/L (ref 11–59)

## 2010-05-08 LAB — CBC
MCH: 29.8 pg (ref 26.0–34.0)
MCHC: 32.6 g/dL (ref 30.0–36.0)
Platelets: 210 10*3/uL (ref 150–400)
RBC: 4.36 MIL/uL (ref 3.87–5.11)

## 2010-05-08 LAB — PROTIME-INR: Prothrombin Time: 25.5 seconds — ABNORMAL HIGH (ref 11.6–15.2)

## 2010-05-08 NOTE — H&P (Signed)
Patient was admitted on 3/12 for chestpain rule out.  Tro were 0.19->0.18-->0.17 which seems to be chronic when reviewed her chart.  Her chest pain resolved.  2D echo shows 60-65% EF with mild to moderate aortic stenosis.  She also had a cardiac cath in 2006 which showed 30-40% area of luminal irregularities in the nondominant circumflex.  We did not consult cardiology during this admission and patient was discharged home and close follow up with Dr. Threasa Beards on 05/17/10.

## 2010-05-09 LAB — URINE MICROSCOPIC-ADD ON

## 2010-05-09 LAB — URINALYSIS, ROUTINE W REFLEX MICROSCOPIC
Glucose, UA: NEGATIVE mg/dL
Hgb urine dipstick: NEGATIVE
Specific Gravity, Urine: 1.01 (ref 1.005–1.030)
Urobilinogen, UA: 0.2 mg/dL (ref 0.0–1.0)

## 2010-05-09 LAB — URINE CULTURE: Culture  Setup Time: 201111290131

## 2010-05-14 ENCOUNTER — Telehealth: Payer: Self-pay | Admitting: *Deleted

## 2010-05-14 MED ORDER — LORAZEPAM 1 MG PO TABS: 1.0000 mg | ORAL_TABLET | Freq: Every evening | ORAL | Status: DC | PRN

## 2010-05-14 NOTE — Telephone Encounter (Signed)
Pt also called about her insurance not being able to fill her Ambien rx.  Will ask her pcp to change to [facility].  Will also obtain rx for [facility] bench [facility]

## 2010-05-14 NOTE — Telephone Encounter (Signed)
Pt called asking for a Rx for "Transfer  Bench" ( she wants a 40016 ) to help her get to toilet to bath tub. She has been using her fathers. She wants to use [facility] [phone]

## 2010-05-15 NOTE — Telephone Encounter (Signed)
Contacted pt to inform her that new rx was called into her pharmacy.  Insurance would not pay for additional ambien refills without a prior authorization.  Per Dr Aundria Rud, pt to take Ativan 1mg  qhs as needed once her current [place] rx runs out.  Also infomed pt that rx for transfer bench (item# 40016) was faxed to [facility].  Phone call complete.

## 2010-05-17 ENCOUNTER — Encounter: Payer: Medicare Other | Admitting: Internal Medicine

## 2010-05-23 ENCOUNTER — Encounter: Payer: Self-pay | Admitting: Internal Medicine

## 2010-05-23 ENCOUNTER — Ambulatory Visit (INDEPENDENT_AMBULATORY_CARE_PROVIDER_SITE_OTHER): Payer: Medicare Other | Admitting: Internal Medicine

## 2010-05-23 VITALS — BP 120/67 | HR 59 | Temp 96.5°F | Ht 62.0 in | Wt 304.7 lb

## 2010-05-23 DIAGNOSIS — E039 Hypothyroidism, unspecified: Secondary | ICD-10-CM

## 2010-05-23 DIAGNOSIS — R0789 Other chest pain: Secondary | ICD-10-CM | POA: Insufficient documentation

## 2010-05-23 DIAGNOSIS — E119 Type 2 diabetes mellitus without complications: Secondary | ICD-10-CM

## 2010-05-23 DIAGNOSIS — I4891 Unspecified atrial fibrillation: Secondary | ICD-10-CM

## 2010-05-23 DIAGNOSIS — I10 Essential (primary) hypertension: Secondary | ICD-10-CM

## 2010-05-23 LAB — GLUCOSE, CAPILLARY: Glucose-Capillary: 182 mg/dL — ABNORMAL HIGH (ref 70–99)

## 2010-05-23 NOTE — Assessment & Plan Note (Signed)
TSH is low at 0.155 and will recheck at next visit. If still low, will decrease synthroid.

## 2010-05-23 NOTE — Assessment & Plan Note (Addendum)
Her chest pain has been much better and reproducible on palpation, likely due to musculoskeletal, but GERD is also possible. ACS less likely as negative work up. Will continue PPI and symptom control with tylenol. May consider change to another PPI if not well controlled.

## 2010-05-23 NOTE — Patient Instructions (Signed)
Please take all your medications as instructed in your instructions.   Please elevate your legs to decrease leg swelling.

## 2010-05-23 NOTE — Assessment & Plan Note (Signed)
Lab Results  Component Value Date   HGBA1C 7.0 05/23/2010   HGBA1C 7.0 01/01/2010   CREATININE 1.18 05/08/2010   MICROALBUR 1.46 05/20/2009   MICRALBCREAT 48.0* 05/20/2009   CHOL  Value: 162        ATP III CLASSIFICATION:  <200     mg/dL   Desirable  045-409  mg/dL   Borderline High  >=811    mg/dL   High        11/09/7827   HDL 39* 05/08/2010   TRIG 145 05/08/2010    Last eye exam and foot exam: No results found for this basename: HMDIABEYEEXA, HMDIABFOOTEX    A1C at goal and on diet control.

## 2010-05-23 NOTE — Progress Notes (Signed)
Subjective:    Patient ID: [name], female    DOB: [birth year], 77 y.o.   MRN: [id number]  HPI Patient is a 77 years old female with past medical history  as outlined here who comes to the Clinic for hospital f/u. She was admitted to hospital on 3/12-3/13/2012 for atypical chest and unremarkable work-up. After discharge she has been doing well, only had one episode of chest pain, lasting just several minutes, much better than in the hospital. She still has acid reflux  wth heartburning. Her leg swelling is better. No fever, chill, shortness of breath on O2, hemoptysis, abdominal pain, nausea, vomiting, diarrhea, melena, dysuria, significant weight change. Denies recent smoking, alcohol or drug abuse. Has been taking all his medications as instructed.     Review of Systems Per HPI.  Current Outpatient Medications Current Outpatient Prescriptions  Medication Sig Dispense Refill  . acetaminophen (TYLENOL) 500 MG tablet Take 500 mg by mouth. Take two tablets by mouth two times a day       . albuterol (PROVENTIL HFA) 108 (90 BASE) MCG/ACT inhaler Inhale 2 puffs into the lungs every 6 (six) hours as needed. 1 to 2 puffs two times a day as needed for wheezing       . amLODipine (NORVASC) 5 MG tablet Take 5 mg by mouth daily.        Marland Kitchen COLCRYS 0.6 MG tablet       . FLUoxetine (PROZAC) 20 MG capsule Take 20 mg by mouth 2 (two) times daily.        . Fluticasone-Salmeterol (ADVAIR DISKUS) 250-50 MCG/DOSE AEPB Inhale 1 puff into the lungs 2 (two) times daily.        . furosemide (LASIX) 40 MG tablet Take 40 mg by mouth daily. Take two tablets daily       . levothyroxine (SYNTHROID, LEVOTHROID) 175 MCG tablet Take 175 mcg by mouth daily.        Marland Kitchen LIPITOR 40 MG tablet TAKE ONE TABLET BY MOUTH EVERY DAY  31 each  6  . losartan (COZAAR) 50 MG tablet Take 1 tablet (50 mg total) by mouth 2 (two) times daily.  60 tablet  6  . polyethylene glycol (MIRALAX) powder Take 17 g by mouth daily. Use as directed        . SINGULAIR 10 MG tablet TAKE ONE TABLET BY MOUTH EVERY DAY  31 each  6  . spironolactone (ALDACTONE) 25 MG tablet Take 25 mg by mouth daily.        Marland Kitchen tiotropium (SPIRIVA) 18 MCG inhalation capsule Place 18 mcg into inhaler and inhale daily.        Marland Kitchen tolterodine (DETROL LA) 2 MG 24 hr capsule Take 2 mg by mouth daily.        Marland Kitchen warfarin (COUMADIN) 5 MG tablet Take as directed  31 tablet  3  . HYDROcodone-acetaminophen (NORCO) 5-325 MG per tablet       . LORazepam (ATIVAN) 1 MG tablet Take 1 tablet (1 mg total) by mouth at bedtime as needed for anxiety (for sleep).  30 tablet  2  . metoclopramide (REGLAN) 5 MG tablet Take 5 mg by mouth. Take 1 tablet by mouth three times a day before meals and bedtime       . omeprazole (PRILOSEC) 20 MG capsule Take 20 mg by mouth 2 (two) times daily.          Allergies Nitrofurantoin  Past Medical History  Diagnosis Date  .  CAD (coronary artery disease)     s/p remote MI (20+ yrs ago)  . Abdominal pain     recent admission, likely secondary to presbyesophagus and gastric dysmotility  . AF (atrial fibrillation)     on chronic warfarin  . CVD (cardiovascular disease)   . Diastolic dysfunction     with dilated cardiomyopathy, last EF 55%(09/2006)  . Respiratory failure, chronic     mixed etiology with bronchospastic component  . Urinary incontinence     recurrent uti's/resistance to cipro, bactrim  . Gout   . Hypothyroidism   . Hyperlipidemia   . Depression   . Osteoarthritis   . Morbidly obese     s/p gastric bypass  . Gastroesophageal reflux disease   . Ventral hernia     repair in April 2008, complicated by MRSA abdominal wall cellulitis  . DVT (deep venous thrombosis)   . Pulmonary embolism     1998, s/p Greenfield filter  . Diabetes mellitus     Past Surgical History  Procedure Date  . Hernia repair   . Cholecystectomy   . Appendectomy   . Breast biopsy        Objective:   Physical Exam General: Vital signs reviewed and  noted. Well-developed,obesity,in no acute distress; alert,appropriate and cooperative throughout examination. Head: normocephalic, atraumatic. Neck: No deformities, masses, or tenderness noted. Lungs: Normal respiratory effort. Clear to auscultation BL, very mild bibasilar crackles, no wheezes.  Heart: RRR. S1 and S2 normal without gallop, or rubs. 3/6 SM in aortic area.  Abdomen: BS normoactive. Soft, Nondistended, non-tender.  No masses or organomegaly. Extremities: Mild pretibial edema (+).         Assessment & Plan:

## 2010-05-23 NOTE — Discharge Summary (Signed)
NAMEMARYKATHLEEN, [name]              ACCOUNT NO.:  [id number]  MEDICAL RECORD NO.:  [id number]           PATIENT TYPE:  I  LOCATION:  2002                         FACILITY:  [facility]  PHYSICIAN:  Blanch Media, M.D.DATE OF BIRTH:  06-14-1932  DATE OF ADMISSION:  05/07/2010 DATE OF DISCHARGE:  05/08/2010                              DISCHARGE SUMMARY    DISCHARGE DIAGNOSIS: 1. Chest pain likely GERD/musculoskeletal 2. HTN 3. HLP 4. CHF exacerbation 5. Urinary incontinence 6. Chronic respiratory failure/asthma 7. Hypothyroidism 8. Atrial fibrillation  DISCHARGE MEDICATIONS: 1. Synthroid 175 mcg p.o. daily. 2. Ventolin HFA 2 puffs inhaled q.4 hours p.r.n. 3. Detrol 2 mg p.o. at bedtime. 4. Reglan 5 mg p.o. t.i.d. 5. Lipitor 40 mg p.o. at bedtime. 6. Singulair 10 mg p.o. daily. 7. Losartan 50 mg p.o. b.i.d. 8. Vitamin B12 1 tablet p.o. daily. 9. Lasix 40 mg p.o. b.i.d. 10.Warfarin 5 mg p.o. daily. 11.Ambien 10 mg p.o. at bedtime. 12.Fluoxetine 20 mg p.o. b.i.d. 13.Advair inhaled 1 puff b.i.d. 14.Spiriva 18 mcg 1 puff daily. 15.Amlodipine 5 mg p.o. daily. 16.Spironolactone 25 mg p.o. daily.  PROCEDURES PERFORMED: 1. A 2D echo on May 08, 2010, shows EF of 60-65%.  Wall motion was     normal.  Aortic valve, there was mild-to-moderate stenosis.  Mild     regurgitation.  Mitral valve shows prior procedure including     surgical repair.  Annular ring prosthesis was present.  Left atrium     was moderately dilated.  Tricuspid valve showed moderate     regurgitation.  Pulmonary arteries, PA peak pressure of 39 mmHg. 2. Chest x-ray on May 07, 2010, shows stable chronic cardiomegaly     and elevation of the right hemidiaphragm.  No acute cardiopulmonary     abnormality.  DISPOSITION AND FOLLOWUP:  [name] was discharged from [facility] on May 08, 2010, in stable and improved condition.  She has no other episode of chest pain or shortness of breath.  She  needs to continue to take her Lasix 40 mg p.o. b.i.d. and follow up with Dr. Threasa Beards on May 17, 2010, at 8:15 a.m.  At that time, he will need to; 1. Please recheck her peripheral edema and evaluate her volume status. 2. Evaluate her blood pressure as it was elevated upon discharge. 3. Follow up on her hypothyroidism.  Her TSH was 0.155, however, we     did not make any changes to her home medications.  CONSULTATION:  None.  ADMISSION HISTORY:  [name] is a 77 year old woman with a complex past medical history, who presented to the ED for chest pain that started at 3 a.m. that woke her up on the day of admission, however, it was not enough for her to call 911, therefore she went back to sleep. She also had another episode similar to that the night prior as well. She reports that she gets similar chest pain episode once in awhile and this is not new.  She stated that her chest pain is intermittent and dull.  The location is left-sided chest, lasting several minutes, 7/10 in severity.  No  radiation, sweating, and chills.  She reported no alleviation or exacerbations factor.  She denies any shortness of breath or fever.  She also has a history of recurrent UTI for which she just finished a course of antibiotics 2-3 weeks prior to admission.  ADMISSION PHYSICAL EXAMINATION:  VITAL SIGNS:  Temperature 98.2, blood pressure 124/44, heart rate 74, respiration rate 15, O2 sat 100% on room air. GENERAL APPEARANCE:  Alert, cooperative, and appears stated age. HEENT:  Pupils equal, round, and reactive to light and accommodation. Extraocular movements intact. NECK:  No adenopathy.  No carotid bruits.  No JVD.  Supple. LUNGS:  Clear to auscultation bilaterally.  No wheezes or crackles. HEART:  Irregularly irregular rhythm, normal rate, S1 and S2.  No murmurs, clicks, or gallops. ABDOMEN:  Soft, nontender.  Normoactive bowel sounds.  No masses, no organomegaly. EXTREMITIES:  +1 edema.  No  cyanosis.  Pulses 2+ and symmetric. NEUROLOGIC:  Alert and oriented x3.  Normal strength and tone.  ADMISSION LABS:  WBC 9.6, hemoglobin 14, hematocrit 43.5, platelet 245. Differential within normal limits.  Sodium 138, potassium 4.4, chloride 98, bicarb 30, glucose 119, BUN 29, creatinine 1.11.  Total bilirubin 0.4, alk phos 79, AST 26, ALT 27, total protein 7.2, albumin 3.6, calcium 10.1.  PT 25.2, INR 2.27, CK-MB point-of-care less than 1, troponin point-of-care less than 0.05, myoglobin 96.8.  BNP 126, troponin 0.19, creatinine kinase total 59, CK-MB 2.1.  HOSPITAL COURSE: 1. Chest pain, likely secondary to musculoskeletal versus     gastroesophageal reflux disease.  She did not have any chest pain episodes during hospitalization.  However, acute coronary syndrome     needs to be ruled out given her risk factors.  The patient did have     a last cardiac catheterization in November 2006, which shows patent     coronary artery with only 30-40% area of luminal irregularities in     the nondominant circumflex.  A 2D echo in August 2008, shows an EF     of 55%, aortic valve thickness was moderately increased and there     was mildly reduced aortic valve leaflet excursion.  Her EKG did     show some nonspecific T-wave inversion, however, no ST elevations     as well as atrial fibrillation.  Furthermore, the patient has a     history of congestive heart failure and congestive heart failure     exacerbations.  BNP is mildly elevated on admission at 126 with     some mild lower extremity edema.  The patient was admitted to     telemetry.  We cycled cardiac enzymes x3 with a troponin of 0.19,     0.18, and 0.17 troponin.  Of note, the patient's troponin has been     in this range in the past with a negative cardiac workup.  An MB is     normal.  Her CK-MB is normal.  We also checked her TSH     which was 0.155.  Her lipase level was 31.  We also measured strict     Is and Os and daily weights  and the patient did lose about 1 kg.     The patient also received Lasix IV 60 mg x1 dose and then we     resumed her home dose of Lasix 40 mg p.o. daily.  The patient did     not have any chest pain during the hospital course and also denies  any shortness of breath.  A 2D echo was also obtained on May 08, 2010, which shows an EF of 60-65%.  There were no regional wall     motion abnormalities.  Aortic valve, mild-to-moderate stenosis.     Mild regurgitation.  Mitral valve shows prior procedures included     surgical repair.  An annular ring prosthesis was present.     Pulmonary artery PA peak pressure of 39 mmHg. 2. Atrial fibrillation.  INR was therapeutic, therefore we continued     her Coumadin. 3. Hypertension.  We continued her Norvasc and Lasix home dose. 4. Hyperlipidemia.  We continued her Lipitor 40 mg p.o. at bedtime and     also check her fasting lipid panel.  Her total cholesterol shows     162, triglyceride 145, HDL 39, LDL 94. 5. Hypothyroidism, stable.  We checked her TSH level which was 0.155.     We continued her home dose of Synthroid. 6. Chronic respiratory failure/asthma.  We continued her Advair and     Proventil. 7. Urinary incontinence.  We continued Detrol LA 2 mg XR at bedtime. 8. History of deep vein thrombosis, pulmonary embolism, first     pulmonary embolism in 1998, status post [place] filter and also     on Coumadin for atrial fibrillation. 9. Congestive heart failure as mentioned in problem #1.  The patient     has a history of congestive heart failure exacerbation even with     mild elevation of BNP.  The patient presented with a BNP of 126     with some mild lower extremity edema.  However, the patient is not     short of breath and lung exam was clear.  The patient received 1     dose of IV Lasix 60 mg on day #1 of admission and then resumed her     home dose medication of Lasix 40 p.o. daily. 10.Elevated troponin which was stable,  secondary to congestive heart     failure.  The patient also has a troponin of 0.19, 0.18, and 0.17     which is likely her baseline.  We do not consult Cardiology given     that the patient was asymptomatic. 11.Hypertension, not adequately controlled.  We continued her current     home medication and she will have close follow up with Dr. Threasa Beards on     May 17, 2010, at 8:15.  DISCHARGE VITALS:  Temperature 97.2, pulse 59, blood pressure 162/55, respirations 18, O2 sat 94% on 2 liters of nasal cannula.  DISCHARGE LABS:  WBC 8.1, hemoglobin 13, hematocrit 39.9, platelet 210. PT 25.5, INR 2.31.  Sodium 134, potassium 3.7, chloride 97, bicarb 30, glucose 146, BUN 33, creatinine 1.18, calcium 9.5, lipase 31.  Lipid panel; cholesterol 162, triglyceride 145, HDL 39, LDL 94.  TSH 0.155. Cardiac enzymes; troponin 0.17, CK total 66, CK-MB 2.9.    ______________________________ Carrolyn Meiers, MD   ______________________________ Blanch Media, M.D.    MH/MEDQ  D:  05/09/2010  T:  05/10/2010  Job:  [id number]  cc:   C. Ulyess Mort, M.D. Jackson Latino, MD  Electronically Signed by Carrolyn Meiers MD on 05/14/2010 01:24:09 PM Electronically Signed by Blanch Media M.D. on 05/23/2010 03:00:34 PM

## 2010-05-23 NOTE — Assessment & Plan Note (Signed)
Lab Results  Component Value Date   NA 134* 05/08/2010   K 3.7 05/08/2010   CL 97 05/08/2010   CO2 30 05/08/2010   BUN 33* 05/08/2010   CREATININE 1.18 05/08/2010    BP Readings from Last 3 Encounters:  05/23/10 120/67  03/09/10 141/55  12/05/09 126/73    BP well controlled and at goal. Will continue her current medications.

## 2010-05-23 NOTE — Assessment & Plan Note (Signed)
On coumadin and will have appointment with Dr. Alexandria Lodge next week for INR and adjust her coumadin dose.

## 2010-05-28 ENCOUNTER — Ambulatory Visit (INDEPENDENT_AMBULATORY_CARE_PROVIDER_SITE_OTHER): Payer: Medicare Other | Admitting: Pharmacist

## 2010-05-28 DIAGNOSIS — Z7901 Long term (current) use of anticoagulants: Secondary | ICD-10-CM

## 2010-05-28 DIAGNOSIS — I4891 Unspecified atrial fibrillation: Secondary | ICD-10-CM

## 2010-05-28 LAB — POCT INR: INR: 3.4

## 2010-05-28 NOTE — Progress Notes (Signed)
Anti-Coagulation Progress Note  [name] is a 77 y.o. female who is currently on an anti-coagulation regimen.    RECENT RESULTS: Recent results are below, the most recent result is correlated with a dose of 30 mg. per week: Lab Results  Component Value Date   INR 3.4 05/28/2010   INR 2.31* 05/08/2010   INR 2.27* 05/07/2010    ANTI-COAG DOSE:   Latest dosing instructions   Total Sun Mon Tue Wed Thu Fri Sat   27.5 5 mg 2.5 mg 5 mg 2.5 mg 5 mg 2.5 mg 5 mg    (5 mg1) (5 mg0.5) (5 mg1) (5 mg0.5) (5 mg1) (5 mg0.5) (5 mg1)         ANTICOAG SUMMARY: Anticoagulation Episode Summary              Current INR goal 2.0-3.0 Next INR check 05/28/2010   INR from last check 3.4! (05/28/2010)     Weekly max dose (mg)  Target end date Indefinite   Indications Long-term (current) use of anticoagulants, AF (atrial fibrillation)   INR check location  Preferred lab    Send INR reminders to [facility]   Comments        Provider Role Specialty Phone number   Ulyess Mort  Internal Medicine [phone]        ANTICOAG TODAY: Anticoagulation Summary as of 05/28/2010              INR goal 2.0-3.0     Selected INR 3.4! (05/28/2010) Next INR check    Weekly max dose (mg)  Target end date Indefinite   Indications Long-term (current) use of anticoagulants, AF (atrial fibrillation)    Anticoagulation Episode Summary              INR check location  Preferred lab    Send INR reminders to [facility]   Comments        Provider Role Specialty Phone number   Ulyess Mort  Internal Medicine [phone]        PATIENT INSTRUCTIONS: Patient Instructions  Patient instructed to take medications as defined in the Anti-coagulation Track section of this encounter.  Patient instructed to OMIT today's dose.  Patient verbalized understanding of these instructions.        FOLLOW-UP Return in 5 weeks (on 07/02/2010) for Follow up INR.  Hulen Luster, III Pharm.D., CACP

## 2010-05-28 NOTE — Patient Instructions (Signed)
Patient instructed to take medications as defined in the Anti-coagulation Track section of this encounter.  Patient instructed to OMIT today's dose.  Patient verbalized understanding of these instructions.    

## 2010-06-02 LAB — URINE CULTURE

## 2010-06-02 LAB — CBC
HCT: 35.6 % — ABNORMAL LOW (ref 36.0–46.0)
HCT: 38.9 % (ref 36.0–46.0)
Hemoglobin: 11.4 g/dL — ABNORMAL LOW (ref 12.0–15.0)
Hemoglobin: 11.5 g/dL — ABNORMAL LOW (ref 12.0–15.0)
Hemoglobin: 12.4 g/dL (ref 12.0–15.0)
MCHC: 32 g/dL (ref 30.0–36.0)
MCV: 81.4 fL (ref 78.0–100.0)
MCV: 81.8 fL (ref 78.0–100.0)
Platelets: 239 10*3/uL (ref 150–400)
Platelets: 240 10*3/uL (ref 150–400)
RBC: 4.35 MIL/uL (ref 3.87–5.11)
RBC: 5.03 MIL/uL (ref 3.87–5.11)
RDW: 18.8 % — ABNORMAL HIGH (ref 11.5–15.5)
WBC: 6.2 10*3/uL (ref 4.0–10.5)
WBC: 7 10*3/uL (ref 4.0–10.5)
WBC: 7.2 10*3/uL (ref 4.0–10.5)

## 2010-06-02 LAB — BASIC METABOLIC PANEL
BUN: 12 mg/dL (ref 6–23)
BUN: 15 mg/dL (ref 6–23)
CO2: 34 mEq/L — ABNORMAL HIGH (ref 19–32)
Calcium: 8.9 mg/dL (ref 8.4–10.5)
Calcium: 9 mg/dL (ref 8.4–10.5)
Calcium: 9.4 mg/dL (ref 8.4–10.5)
Chloride: 90 mEq/L — ABNORMAL LOW (ref 96–112)
Chloride: 92 mEq/L — ABNORMAL LOW (ref 96–112)
Creatinine, Ser: 0.77 mg/dL (ref 0.4–1.2)
GFR calc Af Amer: >60 mL/min (ref >60)
GFR calc Af Amer: >60 mL/min (ref >60)
GFR calc Af Amer: >60 mL/min (ref >60)
GFR calc non Af Amer: 58 mL/min — ABNORMAL LOW (ref >60)
GFR calc non Af Amer: >60 mL/min (ref >60)
GFR calc non Af Amer: >60 mL/min (ref >60)
GFR calc non Af Amer: >60 mL/min (ref >60)
Potassium: 4 mEq/L (ref 3.5–5.1)
Sodium: 136 mEq/L (ref 135–145)
Sodium: 139 mEq/L (ref 135–145)
Sodium: 140 mEq/L (ref 135–145)

## 2010-06-02 LAB — URINALYSIS, ROUTINE W REFLEX MICROSCOPIC
Glucose, UA: NEGATIVE mg/dL
Ketones, ur: NEGATIVE mg/dL
pH: 6.5 (ref 5.0–8.0)

## 2010-06-02 LAB — RETICULOCYTES
RBC.: 4.43 MIL/uL (ref 3.87–5.11)
Retic Ct Pct: 1.2 % (ref 0.4–3.1)

## 2010-06-02 LAB — PROTIME-INR
INR: 1.8 — ABNORMAL HIGH (ref 0.00–1.49)
INR: 2.3 — ABNORMAL HIGH (ref 0.00–1.49)
Prothrombin Time: 20.6 seconds — ABNORMAL HIGH (ref 11.6–15.2)
Prothrombin Time: 24.9 seconds — ABNORMAL HIGH (ref 11.6–15.2)

## 2010-06-02 LAB — FOLATE: Folate: 19.2 ng/mL

## 2010-06-02 LAB — IRON AND TIBC
Iron: 60 ug/dL (ref 42–135)
Saturation Ratios: 18 % — ABNORMAL LOW (ref 20–55)
TIBC: 328 ug/dL (ref 250–470)

## 2010-06-02 LAB — GLUCOSE, CAPILLARY
Glucose-Capillary: 119 mg/dL — ABNORMAL HIGH (ref 70–99)
Glucose-Capillary: 123 mg/dL — ABNORMAL HIGH (ref 70–99)
Glucose-Capillary: 124 mg/dL — ABNORMAL HIGH (ref 70–99)
Glucose-Capillary: 139 mg/dL — ABNORMAL HIGH (ref 70–99)
Glucose-Capillary: 166 mg/dL — ABNORMAL HIGH (ref 70–99)
Glucose-Capillary: 175 mg/dL — ABNORMAL HIGH (ref 70–99)
Glucose-Capillary: 206 mg/dL — ABNORMAL HIGH (ref 70–99)
Glucose-Capillary: 210 mg/dL — ABNORMAL HIGH (ref 70–99)

## 2010-06-02 LAB — URINE MICROSCOPIC-ADD ON

## 2010-06-02 LAB — LIPID PANEL: VLDL: 18 mg/dL (ref 0–40)

## 2010-06-02 LAB — VITAMIN B12: Vitamin B-12: 352 pg/mL (ref 211–911)

## 2010-06-02 LAB — TSH: TSH: 2.167 u[IU]/mL (ref 0.350–4.500)

## 2010-06-02 LAB — CARDIAC PANEL(CRET KIN+CKTOT+MB+TROPI)
Relative Index: 1.8 (ref 0.0–2.5)
Total CK: 101 U/L (ref 7–177)
Total CK: 52 U/L (ref 7–177)
Troponin I: 0.17 ng/mL — ABNORMAL HIGH (ref 0.00–0.06)
Troponin I: 0.17 ng/mL — ABNORMAL HIGH (ref 0.00–0.06)

## 2010-06-02 LAB — PHOSPHORUS: Phosphorus: 3.5 mg/dL (ref 2.3–4.6)

## 2010-06-02 LAB — MAGNESIUM: Magnesium: 1.8 mg/dL (ref 1.5–2.5)

## 2010-06-02 LAB — FERRITIN: Ferritin: 32 ng/mL (ref 10–291)

## 2010-06-04 LAB — GLUCOSE, CAPILLARY: Glucose-Capillary: 183 mg/dL — ABNORMAL HIGH (ref 70–99)

## 2010-06-06 LAB — CBC
HCT: 30.5 % — ABNORMAL LOW (ref 36.0–46.0)
HCT: 31.4 % — ABNORMAL LOW (ref 36.0–46.0)
HCT: 31.5 % — ABNORMAL LOW (ref 36.0–46.0)
HCT: 32.6 % — ABNORMAL LOW (ref 36.0–46.0)
HCT: 32.6 % — ABNORMAL LOW (ref 36.0–46.0)
HCT: 33.4 % — ABNORMAL LOW (ref 36.0–46.0)
HCT: 34.3 % — ABNORMAL LOW (ref 36.0–46.0)
Hemoglobin: 10.4 g/dL — ABNORMAL LOW (ref 12.0–15.0)
Hemoglobin: 10.8 g/dL — ABNORMAL LOW (ref 12.0–15.0)
Hemoglobin: 11 g/dL — ABNORMAL LOW (ref 12.0–15.0)
Hemoglobin: 11.3 g/dL — ABNORMAL LOW (ref 12.0–15.0)
Hemoglobin: 11.4 g/dL — ABNORMAL LOW (ref 12.0–15.0)
MCHC: 33.1 g/dL (ref 30.0–36.0)
MCHC: 33.3 g/dL (ref 30.0–36.0)
MCHC: 33.7 g/dL (ref 30.0–36.0)
MCHC: 33.7 g/dL (ref 30.0–36.0)
MCV: 81.3 fL (ref 78.0–100.0)
MCV: 81.3 fL (ref 78.0–100.0)
MCV: 81.5 fL (ref 78.0–100.0)
MCV: 81.9 fL (ref 78.0–100.0)
MCV: 82.5 fL (ref 78.0–100.0)
MCV: 82.7 fL (ref 78.0–100.0)
MCV: 82.7 fL (ref 78.0–100.0)
Platelets: 233 10*3/uL (ref 150–400)
Platelets: 239 10*3/uL (ref 150–400)
Platelets: 245 10*3/uL (ref 150–400)
Platelets: 268 10*3/uL (ref 150–400)
Platelets: 324 10*3/uL (ref 150–400)
RBC: 3.87 MIL/uL (ref 3.87–5.11)
RBC: 3.94 MIL/uL (ref 3.87–5.11)
RBC: 4.08 MIL/uL (ref 3.87–5.11)
RBC: 4.15 MIL/uL (ref 3.87–5.11)
RDW: 16.4 % — ABNORMAL HIGH (ref 11.5–15.5)
RDW: 16.6 % — ABNORMAL HIGH (ref 11.5–15.5)
RDW: 16.9 % — ABNORMAL HIGH (ref 11.5–15.5)
RDW: 17.1 % — ABNORMAL HIGH (ref 11.5–15.5)
RDW: 17.1 % — ABNORMAL HIGH (ref 11.5–15.5)
RDW: 17.7 % — ABNORMAL HIGH (ref 11.5–15.5)
WBC: 14.7 10*3/uL — ABNORMAL HIGH (ref 4.0–10.5)
WBC: 16.5 10*3/uL — ABNORMAL HIGH (ref 4.0–10.5)
WBC: 16.7 10*3/uL — ABNORMAL HIGH (ref 4.0–10.5)
WBC: 17 10*3/uL — ABNORMAL HIGH (ref 4.0–10.5)
WBC: 20.5 10*3/uL — ABNORMAL HIGH (ref 4.0–10.5)
WBC: 25.7 10*3/uL — ABNORMAL HIGH (ref 4.0–10.5)

## 2010-06-06 LAB — DIFFERENTIAL
Basophils Absolute: 0 10*3/uL (ref 0.0–0.1)
Basophils Absolute: 0.1 10*3/uL (ref 0.0–0.1)
Basophils Relative: 0 % (ref 0–1)
Eosinophils Absolute: 0.1 10*3/uL (ref 0.0–0.7)
Eosinophils Absolute: 0.3 10*3/uL (ref 0.0–0.7)
Eosinophils Absolute: 0.4 10*3/uL (ref 0.0–0.7)
Eosinophils Relative: 0 % (ref 0–5)
Eosinophils Relative: 1 % (ref 0–5)
Eosinophils Relative: 2 % (ref 0–5)
Lymphocytes Relative: 6 % — ABNORMAL LOW (ref 12–46)
Lymphs Abs: 0.9 10*3/uL (ref 0.7–4.0)
Lymphs Abs: 0.9 10*3/uL (ref 0.7–4.0)
Lymphs Abs: 1.4 10*3/uL (ref 0.7–4.0)
Monocytes Absolute: 1.4 10*3/uL — ABNORMAL HIGH (ref 0.1–1.0)
Monocytes Relative: 7 % (ref 3–12)
Monocytes Relative: 7 % (ref 3–12)
Neutro Abs: 12.6 10*3/uL — ABNORMAL HIGH (ref 1.7–7.7)
Neutro Abs: 15.8 10*3/uL — ABNORMAL HIGH (ref 1.7–7.7)
Neutrophils Relative %: 83 % — ABNORMAL HIGH (ref 43–77)
Neutrophils Relative %: 86 % — ABNORMAL HIGH (ref 43–77)
Neutrophils Relative %: 89 % — ABNORMAL HIGH (ref 43–77)

## 2010-06-06 LAB — URINALYSIS, MICROSCOPIC ONLY
Bilirubin Urine: NEGATIVE
Ketones, ur: NEGATIVE mg/dL
Nitrite: NEGATIVE
Urobilinogen, UA: 1 mg/dL (ref 0.0–1.0)

## 2010-06-06 LAB — BASIC METABOLIC PANEL
BUN: 11 mg/dL (ref 6–23)
BUN: 16 mg/dL (ref 6–23)
BUN: 19 mg/dL (ref 6–23)
BUN: 23 mg/dL (ref 6–23)
CO2: 26 mEq/L (ref 19–32)
CO2: 29 mEq/L (ref 19–32)
Calcium: 8.5 mg/dL (ref 8.4–10.5)
Calcium: 8.5 mg/dL (ref 8.4–10.5)
Calcium: 8.9 mg/dL (ref 8.4–10.5)
Chloride: 96 mEq/L (ref 96–112)
Chloride: 96 mEq/L (ref 96–112)
Chloride: 97 mEq/L (ref 96–112)
Chloride: 98 mEq/L (ref 96–112)
Chloride: 98 mEq/L (ref 96–112)
Creatinine, Ser: 1.22 mg/dL — ABNORMAL HIGH (ref 0.4–1.2)
Creatinine, Ser: 1.42 mg/dL — ABNORMAL HIGH (ref 0.4–1.2)
GFR calc Af Amer: 44 mL/min — ABNORMAL LOW (ref >60)
GFR calc Af Amer: 52 mL/min — ABNORMAL LOW (ref >60)
GFR calc Af Amer: >60 mL/min (ref >60)
GFR calc non Af Amer: 36 mL/min — ABNORMAL LOW (ref >60)
GFR calc non Af Amer: 42 mL/min — ABNORMAL LOW (ref >60)
GFR calc non Af Amer: 57 mL/min — ABNORMAL LOW (ref >60)
GFR calc non Af Amer: 58 mL/min — ABNORMAL LOW (ref >60)
GFR calc non Af Amer: >60 mL/min (ref >60)
Glucose, Bld: 122 mg/dL — ABNORMAL HIGH (ref 70–99)
Glucose, Bld: 128 mg/dL — ABNORMAL HIGH (ref 70–99)
Glucose, Bld: 144 mg/dL — ABNORMAL HIGH (ref 70–99)
Glucose, Bld: 161 mg/dL — ABNORMAL HIGH (ref 70–99)
Glucose, Bld: 172 mg/dL — ABNORMAL HIGH (ref 70–99)
Glucose, Bld: 177 mg/dL — ABNORMAL HIGH (ref 70–99)
Potassium: 3.3 mEq/L — ABNORMAL LOW (ref 3.5–5.1)
Potassium: 3.6 mEq/L (ref 3.5–5.1)
Potassium: 3.9 mEq/L (ref 3.5–5.1)
Potassium: 3.9 mEq/L (ref 3.5–5.1)
Potassium: 4.1 mEq/L (ref 3.5–5.1)
Potassium: 4.4 mEq/L (ref 3.5–5.1)
Sodium: 131 mEq/L — ABNORMAL LOW (ref 135–145)
Sodium: 134 mEq/L — ABNORMAL LOW (ref 135–145)
Sodium: 134 mEq/L — ABNORMAL LOW (ref 135–145)
Sodium: 137 mEq/L (ref 135–145)
Sodium: 139 mEq/L (ref 135–145)

## 2010-06-06 LAB — CULTURE, BLOOD (ROUTINE X 2)

## 2010-06-06 LAB — PROTIME-INR
INR: 2.8 — ABNORMAL HIGH (ref 0.00–1.49)
INR: 3.2 — ABNORMAL HIGH (ref 0.00–1.49)
INR: 3.2 — ABNORMAL HIGH (ref 0.00–1.49)
Prothrombin Time: 31.6 seconds — ABNORMAL HIGH (ref 11.6–15.2)
Prothrombin Time: 34.9 seconds — ABNORMAL HIGH (ref 11.6–15.2)

## 2010-06-06 LAB — CARDIAC PANEL(CRET KIN+CKTOT+MB+TROPI)
CK, MB: 1.3 ng/mL (ref 0.3–4.0)
Relative Index: INVALID (ref 0.0–2.5)
Total CK: 12 U/L (ref 7–177)
Total CK: 492 U/L — ABNORMAL HIGH (ref 7–177)
Troponin I: 0.08 ng/mL — ABNORMAL HIGH (ref 0.00–0.06)

## 2010-06-06 LAB — BLOOD GAS, ARTERIAL
Bicarbonate: 28.1 mEq/L — ABNORMAL HIGH (ref 20.0–24.0)
O2 Content: 2 L/min
O2 Saturation: 92.5 %
pH, Arterial: 7.471 — ABNORMAL HIGH (ref 7.350–7.400)
pO2, Arterial: 59.2 mmHg — ABNORMAL LOW (ref 80.0–100.0)

## 2010-06-06 LAB — GLUCOSE, CAPILLARY
Glucose-Capillary: 127 mg/dL — ABNORMAL HIGH (ref 70–99)
Glucose-Capillary: 148 mg/dL — ABNORMAL HIGH (ref 70–99)
Glucose-Capillary: 156 mg/dL — ABNORMAL HIGH (ref 70–99)
Glucose-Capillary: 162 mg/dL — ABNORMAL HIGH (ref 70–99)
Glucose-Capillary: 165 mg/dL — ABNORMAL HIGH (ref 70–99)
Glucose-Capillary: 173 mg/dL — ABNORMAL HIGH (ref 70–99)
Glucose-Capillary: 178 mg/dL — ABNORMAL HIGH (ref 70–99)
Glucose-Capillary: 179 mg/dL — ABNORMAL HIGH (ref 70–99)
Glucose-Capillary: 184 mg/dL — ABNORMAL HIGH (ref 70–99)
Glucose-Capillary: 210 mg/dL — ABNORMAL HIGH (ref 70–99)
Glucose-Capillary: 226 mg/dL — ABNORMAL HIGH (ref 70–99)
Glucose-Capillary: 239 mg/dL — ABNORMAL HIGH (ref 70–99)

## 2010-06-06 LAB — CREATININE, URINE, RANDOM: Creatinine, Urine: 156.7 mg/dL

## 2010-06-06 LAB — URINE CULTURE

## 2010-06-06 LAB — APTT: aPTT: 59 seconds — ABNORMAL HIGH (ref 24–37)

## 2010-06-07 LAB — URINE MICROSCOPIC-ADD ON

## 2010-06-07 LAB — GLUCOSE, CAPILLARY
Glucose-Capillary: 173 mg/dL — ABNORMAL HIGH (ref 70–99)
Glucose-Capillary: 203 mg/dL — ABNORMAL HIGH (ref 70–99)
Glucose-Capillary: 212 mg/dL — ABNORMAL HIGH (ref 70–99)
Glucose-Capillary: 219 mg/dL — ABNORMAL HIGH (ref 70–99)

## 2010-06-07 LAB — POCT I-STAT 3, ART BLOOD GAS (G3+)
Bicarbonate: 31.7 mEq/L — ABNORMAL HIGH (ref 20.0–24.0)
O2 Saturation: 96 %
Patient temperature: 98.6
TCO2: 33 mmol/L (ref 0–100)
pCO2 arterial: 44.3 mmHg (ref 35.0–45.0)
pH, Arterial: 7.462 — ABNORMAL HIGH (ref 7.350–7.400)

## 2010-06-07 LAB — HEPATIC FUNCTION PANEL
ALT: 12 U/L (ref 0–35)
AST: 19 U/L (ref 0–37)
Albumin: 3.1 g/dL — ABNORMAL LOW (ref 3.5–5.2)
Alkaline Phosphatase: 154 U/L — ABNORMAL HIGH (ref 39–117)
Bilirubin, Direct: 0.3 mg/dL (ref 0.0–0.3)
Indirect Bilirubin: 0.7 mg/dL (ref 0.3–0.9)
Total Bilirubin: 1 mg/dL (ref 0.3–1.2)
Total Protein: 7.8 g/dL (ref 6.0–8.3)

## 2010-06-07 LAB — BASIC METABOLIC PANEL
BUN: 15 mg/dL (ref 6–23)
CO2: 30 mEq/L (ref 19–32)
Calcium: 8.9 mg/dL (ref 8.4–10.5)
Calcium: 9.4 mg/dL (ref 8.4–10.5)
Creatinine, Ser: 1.09 mg/dL (ref 0.4–1.2)
Creatinine, Ser: 1.21 mg/dL — ABNORMAL HIGH (ref 0.4–1.2)
GFR calc Af Amer: 59 mL/min — ABNORMAL LOW (ref >60)
GFR calc non Af Amer: 44 mL/min — ABNORMAL LOW (ref >60)
Glucose, Bld: 160 mg/dL — ABNORMAL HIGH (ref 70–99)

## 2010-06-07 LAB — URINALYSIS, ROUTINE W REFLEX MICROSCOPIC
Glucose, UA: NEGATIVE mg/dL
Specific Gravity, Urine: 1.024 (ref 1.005–1.030)
Urobilinogen, UA: 1 mg/dL (ref 0.0–1.0)

## 2010-06-07 LAB — DIFFERENTIAL
Basophils Relative: 0 % (ref 0–1)
Lymphs Abs: 0.6 10*3/uL — ABNORMAL LOW (ref 0.7–4.0)
Monocytes Relative: 4 % (ref 3–12)
Neutro Abs: 17 10*3/uL — ABNORMAL HIGH (ref 1.7–7.7)
Neutrophils Relative %: 92 % — ABNORMAL HIGH (ref 43–77)

## 2010-06-07 LAB — CBC
RBC: 4.89 MIL/uL (ref 3.87–5.11)
WBC: 18.4 10*3/uL — ABNORMAL HIGH (ref 4.0–10.5)

## 2010-06-07 LAB — LIPID PANEL
Cholesterol: 117 mg/dL (ref 0–200)
HDL: 39 mg/dL — ABNORMAL LOW (ref >39)
LDL Cholesterol: 66 mg/dL (ref 0–99)
Total CHOL/HDL Ratio: 3 RATIO
Triglycerides: 62 mg/dL (ref <150)

## 2010-06-07 LAB — CULTURE, BLOOD (ROUTINE X 2): Culture: NO GROWTH

## 2010-06-07 LAB — PROTIME-INR
INR: 2.3 — ABNORMAL HIGH (ref 0.00–1.49)
Prothrombin Time: 27.2 seconds — ABNORMAL HIGH (ref 11.6–15.2)

## 2010-06-07 LAB — CK TOTAL AND CKMB (NOT AT ARMC)
CK, MB: 1 ng/mL (ref 0.3–4.0)
Relative Index: INVALID (ref 0.0–2.5)
Total CK: 51 U/L (ref 7–177)

## 2010-06-07 LAB — HEMOGLOBIN A1C
Hgb A1c MFr Bld: 7.3 % — ABNORMAL HIGH (ref 4.6–6.1)
Mean Plasma Glucose: 163 mg/dL

## 2010-06-07 LAB — CARDIAC PANEL(CRET KIN+CKTOT+MB+TROPI)
CK, MB: 0.9 ng/mL (ref 0.3–4.0)
CK, MB: 1.1 ng/mL (ref 0.3–4.0)
Relative Index: INVALID (ref 0.0–2.5)
Total CK: 47 U/L (ref 7–177)
Troponin I: 0.09 ng/mL — ABNORMAL HIGH (ref 0.00–0.06)

## 2010-06-07 LAB — TROPONIN I: Troponin I: 0.1 ng/mL — ABNORMAL HIGH (ref 0.00–0.06)

## 2010-06-11 ENCOUNTER — Telehealth: Payer: Self-pay | Admitting: *Deleted

## 2010-06-11 NOTE — Telephone Encounter (Signed)
Pt calls seems much more confused today than usual, she somewhat asks about getting diab shoes, i had to finish the sentence for her, she states she has been calling very often and asking for the shoes but has not gotten them, i checked w/ chilon and there is no paperwork for shoes in medical records, i ask pt where she was planning on getting the shoes and she couldn't tell me, she also stated someone from the clinic called her and told her she wouldn't be seeing a diabetic doctor???, i assured her that at her appt tomorrow w/ dr Gilford Rile that a script could be written for the shoes and then she could take it where she would like to get the shoes.

## 2010-06-12 ENCOUNTER — Ambulatory Visit: Payer: Medicare Other | Admitting: Internal Medicine

## 2010-06-13 ENCOUNTER — Ambulatory Visit: Payer: Medicare Other | Admitting: Internal Medicine

## 2010-06-18 ENCOUNTER — Ambulatory Visit (INDEPENDENT_AMBULATORY_CARE_PROVIDER_SITE_OTHER): Payer: Medicare Other | Admitting: Internal Medicine

## 2010-06-18 ENCOUNTER — Encounter: Payer: Self-pay | Admitting: Internal Medicine

## 2010-06-18 DIAGNOSIS — R5383 Other fatigue: Secondary | ICD-10-CM

## 2010-06-18 DIAGNOSIS — E039 Hypothyroidism, unspecified: Secondary | ICD-10-CM

## 2010-06-18 DIAGNOSIS — I4891 Unspecified atrial fibrillation: Secondary | ICD-10-CM

## 2010-06-18 DIAGNOSIS — G4700 Insomnia, unspecified: Secondary | ICD-10-CM

## 2010-06-18 DIAGNOSIS — I10 Essential (primary) hypertension: Secondary | ICD-10-CM

## 2010-06-18 DIAGNOSIS — I509 Heart failure, unspecified: Secondary | ICD-10-CM

## 2010-06-18 DIAGNOSIS — Z7901 Long term (current) use of anticoagulants: Secondary | ICD-10-CM

## 2010-06-18 DIAGNOSIS — E119 Type 2 diabetes mellitus without complications: Secondary | ICD-10-CM

## 2010-06-18 LAB — T4, FREE: Free T4: 2.22 ng/dL — ABNORMAL HIGH (ref 0.80–1.80)

## 2010-06-18 LAB — GLUCOSE, CAPILLARY: Glucose-Capillary: 209 mg/dL — ABNORMAL HIGH (ref 70–99)

## 2010-06-18 LAB — TSH: TSH: 0.227 u[IU]/mL — ABNORMAL LOW (ref 0.350–4.500)

## 2010-06-18 MED ORDER — ESZOPICLONE 1 MG PO TABS: 1.0000 mg | ORAL_TABLET | Freq: Every day | ORAL | Status: DC

## 2010-06-18 NOTE — Assessment & Plan Note (Signed)
Last A1c 7, excellent control, no changes needed.

## 2010-06-18 NOTE — Assessment & Plan Note (Signed)
Reading status, fluid status appears to be at baseline, no changes to diuretics made

## 2010-06-18 NOTE — Assessment & Plan Note (Signed)
Last TSH was low however this was during an acute event in hospitalization, we'll recheck today and make adjustments as needed.

## 2010-06-18 NOTE — Progress Notes (Signed)
Pt aware [facility] to [facility] - they will call pt with appt. Stanton Kidney Girtie Wiersma RN  06/18/10 1:45PM

## 2010-06-18 NOTE — Assessment & Plan Note (Signed)
This has been ongoing for the past several years, the patient's complaints of fatigue throughout the day, will check routine labs give the patient a prescription for Lunesta as she had hallucinations with zolpidem. I scheduled the patient for sleep study to diagnose OSA,  Given the patient's body habitus and clinical presentation, along with a 2-D echo showing elevated pulmonary artery pressures.

## 2010-06-18 NOTE — Assessment & Plan Note (Signed)
Well controlled; no changes needed.   

## 2010-06-18 NOTE — Assessment & Plan Note (Signed)
On Coumadin last INR was supratherapeutic we'll check INR today

## 2010-06-18 NOTE — Patient Instructions (Signed)

## 2010-06-18 NOTE — Progress Notes (Signed)
  Subjective:    Patient ID: [name], female    DOB: September 02, 1932, 77 y.o.   MRN: [id number]  HPI  Patient is a 77 year old female with past medical history listed below, presents to outpatient clinic for hospital followup after she was admitted for chest pain rule out, at that time it was attributed to musculoskeletal origin versus GERD. Patient denies any chest pain, however patient states that she feels tired fatigued, and states that she is unable to sleep at night, was given Ambien in the past however she was having hallucinations from this. She wants to try something different. Denies shortness of breath or any other complaints.  Review of Systems  [all other systems reviewed and are negative       Objective:   Physical Exam  [nursing notereviewed. Constitutional: She is oriented to person, place, and time. She appears well-developed and well-nourished.  HENT:  Head: Normocephalic and atraumatic.  Eyes: Pupils are equal, round, and reactive to light.  Neck: Normal range of motion. Neck supple. No JVD present. No thyromegaly present.  Cardiovascular: Normal rate, regular rhythm and normal heart sounds.   No murmur heard. Pulmonary/Chest: Effort normal and breath sounds normal. She has no wheezes. She has no rales.  Abdominal: Soft. Bowel sounds are normal.  Musculoskeletal: Normal range of motion. She exhibits edema.  Neurological: She is alert and oriented to person, place, and time.  Skin: Skin is warm and dry.          Assessment & Plan:

## 2010-06-19 ENCOUNTER — Telehealth: Payer: Self-pay | Admitting: Internal Medicine

## 2010-06-19 ENCOUNTER — Telehealth: Payer: Self-pay | Admitting: Pharmacist

## 2010-06-19 DIAGNOSIS — E039 Hypothyroidism, unspecified: Secondary | ICD-10-CM

## 2010-06-19 LAB — COMPLETE METABOLIC PANEL WITH GFR
ALT: 25 U/L (ref 0–35)
AST: 20 U/L (ref 0–37)
Albumin: 4.3 g/dL (ref 3.5–5.2)
Alkaline Phosphatase: 95 U/L (ref 39–117)
BUN: 48 mg/dL — ABNORMAL HIGH (ref 6–23)
Creat: 1.35 mg/dL — ABNORMAL HIGH (ref 0.40–1.20)
Potassium: 4.7 mEq/L (ref 3.5–5.3)

## 2010-06-19 MED ORDER — LEVOTHYROXINE SODIUM 150 MCG PO TABS: 150.0000 ug | ORAL_TABLET | Freq: Every day | ORAL | Status: DC

## 2010-06-19 NOTE — Telephone Encounter (Signed)
Patient's TSH was low with a high free T4, she is on Synthroid 175 mcg we'll reduce this to 150 mcg. patient has been informed of this, and agrees the above plan.

## 2010-06-19 NOTE — Telephone Encounter (Signed)
Patient seen by Dr. Birdena Crandall on 23-Apr-12. INR performed during that visit was 3.0 on 27.5mg /wk warfarin (5mg  Su/Tu/Th/Sa and 2.5mg  MWF). Son has been instructed to advise his mom (no answer on her home phone) to CHANGE her warfarin dosing to:  5mg  MWF and 2.5mg  on Su/Tu/Th/Sa (25mg /wk) and RTC on 21-May-12 at 0900h.

## 2010-06-19 NOTE — Progress Notes (Signed)
[facility]t [facility] 07/11/10 8PM. Stanton Kidney Kalil Woessner RN  06/19/10 8:30AM

## 2010-06-21 ENCOUNTER — Telehealth: Payer: Self-pay | Admitting: *Deleted

## 2010-06-21 NOTE — Telephone Encounter (Signed)
Pt calls w/ questions about medication... Synthroid and dosage, pt's son states she took both the and the today, he is instructed that pt as of tomorrow should only take tablet once daily, that he is to possibly take the bottle to the pharmacy and ask pharm to asssist w/ disposal. Also she ask about sleep medication, she states insurance will not pay for lunesta, i will call pharm tomorrow and gain more info on this matter

## 2010-06-22 NOTE — Telephone Encounter (Signed)
Taking the old dose of Synthroid to have a disposed off correctly in the pharmacy is a good idea, in terms of her sleep medication we have attempted to use several agents in the past without effect, and patient did not tolerate Ambien well. [name] is a new drug that could be an alternative for this patient however if her insurance did not pay for it, i am not sure at this point what more can be done. We'll reevaluate other alternatives at next followup.

## 2010-07-02 ENCOUNTER — Ambulatory Visit: Payer: Medicare Other

## 2010-07-04 ENCOUNTER — Telehealth: Payer: Self-pay | Admitting: *Deleted

## 2010-07-04 NOTE — Telephone Encounter (Signed)
Received fax request for prior authorization on Lunesta 1 mg tabs.  After speaking with pt,  she informed me that she has tried [place] in the past and it caused her to hallucinate.  She was rx'd ativan 1mg  at bedtime on 03/19 and still has sleeping problems.  Information passed on to her insurance and they have agreed to pay for 15 tabs a month.  PA good from 06/13/10 until 07/04/11.  Both pt and pharmacy aware.

## 2010-07-10 NOTE — Discharge Summary (Signed)
[name], [name]              ACCOUNT NO.:  [id number]   MEDICAL RECORD NO.:  [id number]          PATIENT TYPE:  INP   LOCATION:  3715                         FACILITY:  [facility]   PHYSICIAN:  Alvester Morin, M.D.  DATE OF BIRTH:  29-Aug-1932   DATE OF ADMISSION:  05/04/2007  DATE OF DISCHARGE:  05/08/2007                               DISCHARGE SUMMARY   DISCHARGE DIAGNOSES:  1. Pneumonia.  2. Pyelonephritis.  3. Acute renal failure.  4. Confusion.  5. Diabetes mellitus.  6. Atrial fibrillation.  7. Diarrhea.  8. Abdominal pain.  9. Escherichia coli bacteremia.   DISCHARGE MEDICATIONS:  Include:  1. Lipitor 40 mg p.o. daily.  2. Synthroid 175 mcg daily.  3. Albuterol inhaler 1-2 puffs q.4-6 h. p.r.n.  4. Colchicine 0.6 mg p.o. b.i.d.  5. Cardizem CD 120 mg p.o. daily.  6. Benadryl 25 mg p.o. at bedtime.  7. Singulair 10 mg p.o. daily.  8. Fluoxetine 20 mg p.o. daily.  9. Omeprazole 20 mg p.o. daily.  10.Advair 250/50 mcg 1 puff b.i.d.  11.Spiriva 18 mcg 1 inhalation daily.  12.Tylenol Extra Strength p.r.n.  13.Lasix 20 mg p.o. daily.  14.Coumadin per Dr. Michaell Cowing in the outpatient clinic.  15.Colyte 240 g daily.  16.Klor-Con 20 mEq p.o. daily.  17.Cozaar 50 mg p.o. daily.  18.Sanctura 20 mg p.o. b.i.d.  19.Omnicef 300 mg 1 p.o. b.i.d. x10 days.  20.Azithromycin 250 mg p.o. daily x5 days.  21.Magnesium oxide 400 mg p.o. b.i.d. x7 days.   DISPOSITION:  The patient is to return to the outpatient clinic to see  Dr. Beverely Pace on May 22, 2007, at 2:00 p.m. at which time a CBC and BMET  should be checked.  It may be beneficial to check a UA with urine C and  S to make sure her pyelonephritis has resolved.  The patient should have  a BMET and CBC done as well.  The patient is also to return to Dr. Michaell Cowing  in the outpatient clinic on May 18, 2007, at 11:45 a.m. to have her  INR checked.   PROCEDURES PERFORMED:  Includes:  The patient had a chest-x-ray on May 04, 2007,  which showed patchy bilateral infiltrates consistent with  pneumonia.  Chest x-ray on May 05, 2007, showed:  1. The right arm PICC appears to have withdrawn at the upper SVC.  2. No pneumothorax or other changes demonstrated.   There were no consultations made.   Brief H and P is as follows:   CHIEF COMPLAINTS:  1. Weakness.  2. Confusion.  3. Dysuria.   HISTORY OF PRESENT ILLNESS:  The patient is a 77 year old woman with  complex past medical history including CHF, diabetes mellitus, COPD, on  home O2, AFib on Coumadin, hyperlipidemia who presents with a 3-day  history of fatigue, confusion, left-sided pain, dysuria, and hematuria.  The patient was previously feeling well at baseline prior to her  admission.  She denies any acute worsening of shortness of breath,  cough, or chest pain.  She does endorse recent nausea, but no vomiting.  She has only been  able to tolerate liquids for 3 days.   ALLERGIES:  Please see hospital chart.   PAST MEDICAL HISTORY:  Please see hospital chart.   PAST SURGICAL HISTORY:  Please see hospital chart.   MEDICATIONS:  Please see hospital chart.   SUBSTANCE HISTORY:  Please see hospital chart.   SOCIAL HISTORY:  Please see hospital chart.   FAMILY HISTORY:  Please see hospital chart.   PHYSICAL EXAMINATION:  VITAL SIGNS:  Temperature equals 99.6, blood  pressure equals 136/70, pulse equals 55, respiratory rate equals 20, and  O2 sats 92% on 2 L.  GENERAL:  Obese, upper airway wheezing bilaterally.  EYES:  EOMI, anicteric.  ENT:  No lymphadenopathy.  RESPIRATORY:  CTA bilaterally, posteriorly except for the wheezing as  mentioned above.  CV:  Irregularly irregular.  No murmurs, rubs, or gallops.  GI:  Soft, nontender, and protuberant.  Positive bowel sounds.  EXTREMITIES:  No edema.  SKIN:  No rashes, no jaundice.  NEUROLOGIC:  Nonfocal.  Cranial nerves II through XII grossly intact.  MUSCULOSKELETAL:  The patient is moving all 4  extremities.  PSYCH:  Oriented x3.   ADMISSION LABS:  Sodium 137, potassium 4.2, chloride 95, bicarb 32, BUN  35, creatinine 1.45, and glucose 154.  Anion gap of 10.  Bilirubin 1.3,  alk phos 112, AST 20, ALT 18, protein 6.2, albumin 2.6, and calcium 8.7.  WBC 25.5, hemoglobin 12.8, and platelets 221.  PT equals 32.7 and INR  equals 3.1.  UA had positive bacteria, many rbc's 7-10, wbc's too  numerous to count, nitrite negative, and leukocyte esterase large.  Chest x-ray also showed patchy infiltrates consistent with pneumonia.   HOSPITAL COURSE BY PROBLEM:  1. Bilateral pneumonia.  The patient was admitted to telemetry bed and      she was put on Rocephin and azithromycin for antibiotic coverage.      The patient also had urine Legionella and Streptococcus pneumoniae      antigens tested, which were negative.  The patient was also given      nebulizer treatment secondary to her wheezing.  On hospital day #4,      the patient was switched from Rocephin to azithromycin.  The      patient's respiratory status gradually improved without needing to      be intubated at any time during the hospital stay.  The patient's      white cell count also decreased precipitously with the addition of      antibiotics.  2. Pyelonephritis.  The patient has a history of a resistant urinary      tract infection in the past.  This organism was found to be      sensitive to Rocephin, which the patient was initiated on along      with her azithromycin for her pneumonia coverage.  Of note, the      patient was switched to [facility] 1 day prior to discharge.  The      patient should likely have a urinalysis with urine C and S and      follow up to make sure that her urinary symptoms have resolved.  3. Escherichia coli bacteremia.  The patient was noted to have blood      cultures positive for Escherichia coli, which was treated with      Rocephin, and the patient will be put on Omnicef as an outpatient      to  help cover this organism.  4.  Acute renal failure, resolved.  The patient's renal failure was      likely secondary to dehydration.  She quickly responded to IV      fluids.  Her creatinine should be checked in the outpatient setting      as well.  5. Confusion.  The patient's confusion is likely secondary to fever as      well as her pyelonephritis and her pneumonia.  Confusion quickly      resolved with antibiotics and hydration.  Of note, the patient on      occasion has baseline occasional confusion, especially while in the      hospital.  This is based on previous hospitalizations when the      patient was seen by myself.  6. Diabetes mellitus.  The patient had a hemoglobin A1c of 6.9.  CBGs      remained stable throughout the hospital stay, and she was continued      on sliding-scale insulin.  No changes to outpatient regimen at this      time.  7. Atrial fibrillation.  The patient will be continued on Cardizem.      The patient had good control of her rate while in hospital stay.      She will also have her Coumadin continued at its current dosing      since her INR was therapeutic during her hospital stay.  She will      follow up with Dr. Michaell Cowing in the outpatient clinic for further      management of this.  8. Diarrhea.  The patient's diarrhea was likely secondary to Colace as      well as receiving an enema.  This resolved at the time of the      discharge.  After her bowel movements, her Colace was held.  9. Abdominal pain.  The patient's abdominal pain was likely related to      her diarrhea and her constipation prior to her diarrhea.  The      patient's abdominal pain resolved at the time of discharge.   DISCHARGE LABS:  Include blood cultures positive for E. Coli.  Magnesium  1.6, for which the patient was put on magnesium oxide for this.  Sodium  137 and potassium 3.3, which was repleted.  Chloride 99, bicarb 34,  glucose 120, BUN 6, creatinine 0.75, and calcium 8.5.  WBC  7.6,  hemoglobin 10.9, hematocrit 33.0, MCV 81.7, RDW 16.9, and platelets 226  with an ANC of 5.0.   DISCHARGE VITALS:  Include temperature 98.6, pulse 67, respiratory rate  20, blood pressure 140/76, and O2 sats 97% on 2 L.      Rufina Falco, M.D.  Electronically Signed      Alvester Morin, M.D.  Electronically Signed    JY/MEDQ  D:  05/13/2007  T:  05/14/2007  Job:  [id number]   cc:   Ardeth Sportsman, MD  Lollie Sails, MD

## 2010-07-10 NOTE — Discharge Summary (Signed)
[name], [name]              ACCOUNT NO.:  [id number]   MEDICAL RECORD NO.:  [id number]          PATIENT TYPE:  INP   LOCATION:  4711                         FACILITY:  [facility]   PHYSICIAN:  Fransisco Hertz, M.D.  DATE OF BIRTH:  March 21, 1932   DATE OF ADMISSION:  05/24/2008  DATE OF DISCHARGE:  06/01/2008                               DISCHARGE SUMMARY   DISCHARGE DIAGNOSES:  1. Urinary tract infection with compounding sepsis and altered mental      status, culture positive for vancomycin-resistant enterococcus that      is sensitive to ampicillin, symptoms have resolved and the patient      is to receive a total of 10 days of Unasyn and then amoxicillin.  2. History of frequent resistant urinary tract infections with      resistant pathogens - Enterobacter, Klebsiella, and E-coli all have      grown on her urinary cultures in the past.  3. Atrial fibrillation, on chronic Coumadin.  The patient's dose of      Coumadin was reduced with her being discharged on amoxicillin and      Diflucan.  4. Mild thrush, discharged on Diflucan.  5. History of coronary artery disease, status post remote myocardial      infarction.  6. Cerebrovascular disease, status post cardiovascular accident in      September 2007.  7. Diastolic dysfunction with dilated cardiomyopathy, last ejection      fraction 55% in 2008.  8. Chronic respiratory failure of mixed etiology with bronchospastic      component.  9. Urinary incontinence.  10.Status post bilateral pneumonia in 2009.  11.History of gout.  12.History of hypothyroidism.  13.History of hyperlipidemia.  14.History of depression.  15.Morbid obesity, status post gastric bypass.  16.Gastroesophageal reflux disease.  17.Ventral hernia, status post repair in April 2008, complicated by      methicillin-resistant Staphylococcus aureus abdominal wall      cellulitis.  18.History of deep venous thrombosis and pulmonary emboli in 1998,      status post  [place] filter.   DISCHARGE MEDICATIONS:  1. Augmentin 875/125 p.o. b.i.d. x6 days, prescription provided.  2. Fluconazole 150 mg p.o. daily x5 days, prescription provided.  3. Coumadin 1 mg/2 mg p.o. on alternating days until the patient is      seen in Coumadin Clinic on June 06, 2008, prescription provided.  4. Glipizide 5 mg in a.m. and 2.5 mg in the evening, prescription      provided.  5. Synthroid 175 mg p.o. daily.  6. Lipitor 40 mg p.o. daily.  7. Proventil inhaler 1-2 puffs b.i.d.  8. Singulair 10 mg p.o. daily.  9. Prozac 20 mg p.o. daily.  10.Omeprazole 40 mg p.o. daily.  11.Advair 250/50 b.i.d.  12.Spiriva 18 mcg daily.  13.Lasix 20 mg p.o. daily.  14.Cozaar 14, 50 mg b.i.d.  15.MiraLax use as directed.  16.Reglan 5 mg tabs p.o. q.a.c.  17.Fish oil 3 tabs once a day.  18.Tylenol use as directed.  19.Detrol p.o. use as directed.  20.Fluticasone 50 mcg 1 spray each nostril once a day.  CONDITION ON DISCHARGE:  The patient's altered mental status had  completely cleared upon discharge.  Her white count continued to trend  down and her discharge WBC was 13.2 and the patient had remained  afebrile for much of her hospitalization.  She was discharged on  amoxicillin for her ampicillin sensitive VRE UTI.  She is to follow up  with Dr. Aundria Rud in the outpatient clinic on June 17, 2008, at 9:30 a.m.  Given that the patient was discharged on amoxicillin and fluconazole,  the dosage of her Coumadin was reduced to one in 2 mg on alternating  days.  The patient is scheduled to follow up in the outpatient Coumadin  Clinic on June 06, 2008.  The patient's general status is quite frail,  and the patient has limited activity at home.  She has a CNA that visits  her 2 hours, 4 days a week, but this may not be enough.  The patient is  very resistant to skilled nursing facility or home health physical  therapy or occupational therapy, and we consulted with the social worker   and also discussed this issue at length with her son.  Unfortunately,  since the patient was back to her baseline, she was discharged home with  her current 2 hours a day 4 days a week CNA care.   CONSULTATIONS:  None.   IMAGING:  1. The patient had a CT of her head that showed left frontal      encephalomalacia, no acute findings.  2. The patient had a chest x-ray on May 24, 2008, that showed      pulmonary vascular congestion without edema.  The patient had a 2-D      Chest x-ray on May 29, 2008, that showed cardiomegaly, pulmonary      vascular congestion, and probable mild interstitial pulmonary      edema.  3. The patient had renal ultrasound showed moderate left      hydronephrosis.  No right hydronephrosis is identified.  4. History and physical.  [name] is a 77 year old woman with      past medical history as outlined above who is generally quite frail      and with limited mobility at home, who presented to the ED on May 24, 2008, with an episode of acute altered mental status, which      occurred in the morning of admission.  She was noted by her son to      be mumbling and incoherent this morning, along with shaking that he      noted when trying to take her pulse, so he called EMS.  He did not      notice facial droop, dysarthria, or focal weakness anywhere.  The      patient says, she recalls the episode then she felt strange like I      might have had another stroke.  She is not sure whether she had      any numbness or weakness anywhere, just difficulty talking and      finding words.  No other symptoms of stroke.  She and her son noted      an increase foul odor from her urine and increasing urinary      incontinence recently as well.  Has an episode of fecal      incontinence a day prior to admission, which is unusual for her.      She denies dysuria, but  endorses urinary frequency and endorses      suprapubic tenderness.  She denies fever, but she had  chills      associated with her episode of altered mental status as not above.      She denies chest pain, palpitations, dyspnea, edema, nausea, or      vomiting.  She does report frequent bitemporal headache that seem      to come on the mornings while she is making breakfast over the last      couple of weeks.  Per her son, her current difficulties for word      finding/fluency of speech, confusion or not her baseline.  She did,      per EMS records, initially said she had no complaints, then in      route, she told them that she had chest pain all day.   PHYSICAL EXAMINATION:  ADMISSION VITAL SIGNS:  Temperature of 98.1,  pulse of 63, and blood pressure of 128/109.  GENERAL:  Alert, obese woman sitting on chair, she is in no acute  distress.  HEAD:  Normocephalic and atraumatic.  EYES:  Vision grossly intact.  Pupils are equal, round, and reactive to  light.  EARS:  No external deformities.  NOSE:  No external deformities.  Mouth:  Oropharynx pink and moist, and fair dentition.  LUNGS:  Normal respiratory effort and normal breath sounds.  Fair air  movement and faint crackles at bases.  HEART:  Irregular irregular rhythm and rate.  No murmurs.  ABDOMEN:  Obese.  Bowel sounds normoactive.  Soft, nontender, and no  guarding.  EXTREMITIES:  Feet are cold, no edema.  Good pulse bilaterally.  NEUROLOGIC:  Alert and oriented to person, place, and proximity.  She  has significant difficulties with word findings and wanders off topic as  well as thinking of correct words.  Can say days of week backwards.  Can  name pen and watch.  Can immediately recall 3/3, delayed recall  impaired.  Cranial nerves II through XII intact.  Strength normal in  extremities.  Sensation intact.  DTR is symmetric and normal.  Range of  motion intact.  Finger-to-nose intact.  Gait cannot be assessed.   INITIAL LABORATORIES:  White blood cell count of 18.4, hemoglobin 12.9,  platelets of 295, and RDW of  16.3.   BMET follows:  Sodium 134, potassium 3.7, chloride 96, bicarbonate 28,  BUN of 13, creatinine 1.09, and calcium 9.4.   INR 2.3.   Urinalysis pH 6.5 greater than 300 proteins, negative nitrites, and  large leukocytes, large blood and 15 ketones.  Micro shows too numerous  to count WBCs with 21-50 RBCs.  Initial chest x-ray as above and she has  pulmonary vascular congestion without edema.  EKG is significant for  AFib at the rate of 76 and left axis deviation and Q-waves in V1-V2.   HOSPITAL COURSE:  1. Vancomycin-resistant enterococcus urinary tract infection:  The      patient initially presented with altered mental status with a      differential being broad given her extensive past medical history.      A head CT ruled out an acute hemorrhagic CVA, and her nonfocal      neurologic presentation made any CVA less likely.  The patient had      been complaining of urinary symptoms and had a UA that had large      number of leukocytes and micro with too many to count  wbc's.  She      has prominent history of resistant urinary tract infections and her      urinary culture on this hospitalization showed vancomycin-resistant      enterococcus that was sensitive to AMPICILLIN.  The patient had      initially been started on Rocephin, but after the urinary culture      results were obtained, the patient was converted to Unasyn.      Supportive care was applied including IV hydration and appropriate      nursing measures.  Blood cultures were also obtained, which did not      show any growth.  The patient gradually improved with the      appropriate antibiotic therapy and was somewhat slow to progress,      secondary to her multiple comorbidities and general frail status.      Upon discharge, her altered mental status had completely cleared      and she had worked with physical therapy and occupational therapy      in the hospital to increase mobility and she was close to her       fragile baseline.  The patient was discharged on 5-day course of      Augmentin to complete a total of 10 days of Unasyn/Augmentin.  2. Altered mental status:  As noted above, this was secondary to the      patient's urinary tract infection and initially resolved after the      third day of hospitalization, but it did not relapse on the fifth      or sixth day of hospitalization, which she was noted to be having      possible hallucination at night.  She is afebrile at this point and      the second episode of altered mental status might have been      associated with hospital delirium.  Fortunately, resolved by the      morning and she was asymptomatic for the last few days for      hospitalization.  3. Mild pulmonary edema:  The patient was aggressively hydrated for      the first few days of her hospitalization and she has known      diastolic dysfunction in her last echo in August 2008, showed      moderately increased LV wall thickness.  With the hydration, she is      noted to be a little bit more dyspneic than normal and crackles      were found at her lung bases.  For this reason, a stat portable      chest x-ray and ABG were obtained.  The chest x-ray showed possible      mild pulmonary edema and that the ABG showed pH of 7.47, pCO2 of      39, and pO2 of 59.  Secondary to the pulmonary edema, the patient      was given small dose of IV Lasix and diuresed very well and      symptomatically improved by the next day.  4. Diabetes mellitus:  The patient was treated with 5 units of Lantus      and sliding scale insulin, and her blood sugars were under fair      control for the duration of her hospitalization.  She also had a      hemoglobin A1c that was drawn that was 7.3.  5. Hypertension:  The patient was initially admitted with  her Cozaar      the patient potentially being dehydrated and concern for acute      renal insufficiency, although her creatinine naturally spiked       during this hospitalization.  Her blood pressure was under fair      control, although was elevated up into the 150s on a couple of      occasions.  She has returned to her home medications and her      discharge blood pressure was 128/58.  6. Hypothyroidism:  The patient was continued on her Synthroid during      this hospitalization and a TSH was done, which was 4.433.  This      should be followed up in the outpatient setting.  7. General frail condition and multiple comorbidities:  The patient      was resistant to SNF placement for home health PT.  On a couple of      instances in the hospital, the patient was resistant working with      her physical therapy.  We talked with Child psychotherapist as well as the      patient's son to try and help establish long-term plan, however,      once the patient's altered mental status broke, she was clear and      appeared capable of making her own medical decisions.  Thus, we      discharged her back to home and she will follow up in the      outpatient setting.  The patient's son seems pretty well aware of      the patient's general limited mobility and general poor health, and      seems pretty willing to deal with everything.   DISCHARGE VITALS:  Temperature 98.4, pulse is 67, blood pressure 128/58,  and O2 sat 98% on room air on 2 L.  Discharge labs:  Sodium 134, potassium 3.9, chloride of 96, bicarb of  32, glucose at 122, BUN at 11, creatinine of 0.88, and INR of 2.8.  White blood cell count 13.2, hemoglobin 10.2, and platelets 324.   PENDING LABS:  There were no pending labs at this time.       Linward Foster, MD  Electronically Signed      Fransisco Hertz, M.D.  Electronically Signed    LW/MEDQ  D:  06/02/2008  T:  06/03/2008  Job:  [id number]   cc:   C. Ulyess Mort, M.D.

## 2010-07-10 NOTE — Discharge Summary (Signed)
NAMEBRITENY, [name]              ACCOUNT NO.:  [id number]   MEDICAL RECORD NO.:  [id number]          PATIENT TYPE:  INP   LOCATION:  5504                         FACILITY:  [facility]   PHYSICIAN:  C. Ulyess Mort, M.D.DATE OF BIRTH:  1932/07/12   DATE OF ADMISSION:  10/05/2008  DATE OF DISCHARGE:  10/08/2008                               DISCHARGE SUMMARY   CONTINUITY DOCTOR:  C. Ulyess Mort, MD   OUTPATIENT CLINIC CONSULTANTS:  None.   DISCHARGE DIAGNOSES:  1. Congestive heart failure exacerbation.  2. Urinary tract infection.   PAST MEDICAL HISTORY:  1. AFib on chronic warfarin.  2. Coronary artery disease status post remote MI 20 years ago.  3. Stroke in 1997, no residual weakness.  4. Diastolic dysfunction with dilated cardiomyopathy, last ejection      fraction was 55% by 2-D echo in 2008.  5. COPD cardiorespiratory failure meets etiology with bronchospastic      component.  6. History of frequent urinary tract infection with E. coli,      Klebsiella, VRE.  7. History of bilateral pneumonia in 2009.  8. History of gout.  9. History of hypothyroidism.  10.Hyperlipidemia.  11.Depression.  12.History of osteoarthritis of knee, hip, and spine.  13.Morbid obesity status post gastric bypass.  14.Gastroesophageal reflux disease.  15.Ventral hernia status post repair in April 2008 complicated by      MRSA, abdominal wall cellulitis.  16.History of DVT and pulmonary embolism in 1998 status post Green      Filter.  17.History of diabetes mellitus type 2, last hemoglobin A1c at 6.7 in      June 2010.   DISCHARGE MEDICATIONS:  1. Lipitor 40 one tablet p.o. daily.  2. Fish oil take 3 tablets by mouth daily.  3. Levothyroxine 175 mcg 1 tablet once a day.  4. Proventil 1-2 puffs 2 times a day as needed.  5. Advair Diskus 250/50 mcg twice a day.  6. Spiriva once a day.  7. Singulair 10 mg 1 tablet daily.  8. Fluticasone one spray each nostril 2 times a day.  9. Benadryl 25  one tablet by mouth at bedtime.  10.Lasix 40 mg 1 tablet daily.  11.Cozaar 50 mg take 1 tablet twice a day.  12.Warfarin 5 mg 1 tablet daily.  13.Fluoxetine 20 mg 1 tablet once a day.  14.Detrol take 1 capsule by mouth once a day.  15.Omeprazole 20 mg 1 tablet twice a day.  16.Reglan 5 mg 1 tablet three times a day before meals and at bedtime.  17.MiraLax use as needed.  18.Tylenol 2 tablets by mouth 2 times a day.  19.Nitrofurantoin 100 mg 1 tablet twice a day for 5 more days.   DISPOSITION AND FOLLOWUP:  Ms. Sandstrom will follow with Dr. Aundria Rud in  the outpatient clinic October 28, 2008, at 9:30 a.m.  During that  appointment, fluid overload need to be reassessed, also blood pressure  medication will need to reassess.  Cardizem was stopped during this  hospitalization due to bradycardia, so we need to consider restart  Cardizem low dose if heart rate increase.  She will follow, the  outpatient clinic will call her with an appointment to see Dr. Michaell Cowing for  INR check.   PROCEDURE PERFORMED:  None.   BRIEF HISTORY OF PRESENT ILLNESS:  This is a 77 year old woman with past  medical history of CHF, diastolic dysfunction with an ejection fraction  of 55%, COPD, AFib on chronic Coumadin, DVT with Green Filter placement,  history of urinary incontinence presented to clinic with increased  shortness of breath in the last 3 weeks.  The patient has been mild  short of breath.  She has increased 7 pounds over the last couple of  weeks.  She was complaining of GU-related some chest pain over the last  3 weeks, some chest tightness last like 10-15 minutes resemble her COPD  exacerbation symptoms.  She denies chest pain or shortness of breath on  exertion.  She relate orthopnea over the last 3 weeks and increasing  lower extremity edema.   PHYSICAL EXAMINATION:  VITALS:  Temperature 98, pulse 54, blood pressure  182/71, respirations 18, sat 95% on 3 L, weight 280 pounds.  GENERAL:  The  patient is in no acute distress.  CARDIOVASCULAR:  S1 and S2.  Regular rhythm and rate.  Difficult to  evaluate for JVD due to thick neck.  LUNGS:  Decreased breath sounds.  Bilateral crackles at the bases.  ABDOMEN:  Obese.  Bowel sounds present.  Midline scar, nontender, no  rigidity.  EXTREMITIES:  Pulse positive.  Edema +2.   ADMISSION LABS:  Sodium 139, potassium 4.4, chloride 99, bicarb 34, BUN  19, creatinine 0.94, glucose 125.  White blood cells 7.2, hemoglobin  11.4, platelet 239.  Troponin 0.17.  CK-MB 1.7.  CK 52.   HOSPITAL COURSE:  1. CHF exacerbation.  The patient presented with symptoms of CHF,      orthopnea, increased fluid retention, lower extremity edema, and      chest x-ray consistent with pulmonary edema.  BNP was mildly      elevated at 138.  The patient was started on IV Lasix 40 mg IV      twice a day.  She had a good urine output of more than 5 L during      this admission.  The patient's symptoms improved.  EKG without      significant changes.  Cardiac enzymes mildly elevated at 0.17 and      0.16.  This was considered to be secondary to CHF exacerbation.      EKG no changes.  No chest pain during this admission.  The patient      had a cardiac catheterization in November 2006 that show only mild      coronary arteries.  She will be discharged on 40 of Lasix p.o.      daily.  2. AFib.  The patient on admission heart rate was in the 50-60 range.      We stopped the Cardizem due to bradycardia, so this will need to be      reassessed on the outpatient basis.  On EKG, she was sinus.  The      patient was continued on Coumadin.  She will follow with Dr. Michaell Cowing,      next INR on an outpatient basis.  3. Problem urinary tract infection.  The patient was having some      urinary incontinence and dysuria.  UA showed numerous too count      white blood cells, positive nitrites, and large  leukocyte.  She had      prior urine culture that grew E. Coli, we will treat  her with 7      days of nitrofurantoin.  4. Hypothyroidism.  TSH within normal limits.  We will continue with      Synthroid.  5. COPD.  The patient was continued on albuterol and Advair.  The      patient was stable during this hospitalization.  Her COPD was      stable during this hospitalization.   On the day of discharge, the patient was in good condition.  No  shortness of breath.   VITALS:  Temperature 98, blood pressure 148/72, pulse 90, respiration  18, white blood cell 7.4.   LABORATORY DATA:  Hemoglobin 9.2, platelet 195.  Sodium 131, potassium  3.2, this will be was repleted with 40 p.o., chloride 101, bicarb 24,  BUN 35, creatinine 1.32.  The patient was discharged in good condition.      Hartley Barefoot, MD  Electronically Signed      C. Ulyess Mort, M.D.  Electronically Signed    BR/MEDQ  D:  10/08/2008  T:  10/08/2008  Job:  [id number]

## 2010-07-10 NOTE — Discharge Summary (Signed)
NAMEDAMANI, RANDO              ACCOUNT NO.:  [id number]   MEDICAL RECORD NO.:  [id number]          PATIENT TYPE:  INP   LOCATION:  5509                         FACILITY:  [facility]   PHYSICIAN:  Eliseo Gum, M.D.   DATE OF BIRTH:  1933-01-23   DATE OF ADMISSION:  10/01/2006  DATE OF DISCHARGE:  10/07/2006                               DISCHARGE SUMMARY   DISCHARGE DIAGNOSES:  1. Nausea and vomiting resolved, considered to be secondary to urinary      tract infection.  2. Urinary tract infection, E. coli, treated with cefpodoxime.  3. Methicillin-resistant Staphylococcus aureus positive culture from      abdominal wound.  4. History of atrial fibrillation.  5. Hypertension.  6. Diabetes.  7. Hypothyroid.  8. Hyperlipidemia.  9. Ventral hernia repair on June 12, 2006 status post large hematoma      for which there is currently a vacuum compression bandage over a      small area of the lower abdomen.  10.History of pulmonary embolism and deep vein thrombosis status post      inferior vena cava filter placement.  Patient on Coumadin.  11.Depression/anxiety.  12.Gout.  13.Morbid obesity.   MEDICATIONS:  The patient was discharged on the following medications:  1. Aspirin 81 mg one tablet daily.  2. Lipitor 40 mg one tablet daily.  3. Colchicine 0.6 mg one tablet daily.  4. Darifenacin 7.5 mg one tablet daily.  5. Diltiazem 120 mg one tablet daily.  6. Benadryl 25 mg one tablet daily at bedtime.  7. Prozac 20 mg one tablet daily.  8. Advair inhaler 250/50 use twice daily.  9. Synthroid 200 mcg take one tablet daily.  10.Spiriva 18 mcg inhaler use once daily.  11.Omeprazole 20 mg one tablet twice a day.  12.Lasix 20 mg one tablet daily.  13.Cozaar 50 mg one tablet daily.  14.K-Dur 30 mEq one tablet daily.  15.Proventil HFA one to two puffs twice a day.  16.Sanctura XR 60 mg one tablet daily.  17.Vantin 200 mg one tablet daily for four more days.  18.Vancomycin 150 mg IV  every 2 hours via PICC line.  [facility] will be checking levels on August 14 and determining dosages      after that date.  Start date August 11, give for a total of seven      days, her last dose is August 17.   PROCEDURE PERFORMED DURING HOSPITALIZATION:  1. 2-D echo performed on August 8 which showed overall left      ventricular systolic function lower limits of normal, ejection      fraction 55%.  Mild aortic valve regurgitation.  Mild mitral valve      regurgitation.  Dilated left atrium.  Mildly dilated right      ventricle.  Right ventricular systolic function mildly reduced.      Right atrium mildly dilated.  2. Acute abdominal series performed on August 6 showed no acute      cardiopulmonary disease.  No specific air fluid levels in colon.  Nonobstructive bowel gas pattern.  3. Chest x-ray on August 11 showed PICC line tip in region of superior      vena cava, stable cardiomegaly.   There were no complications during this hospitalization.   ADMITTING HISTORY AND PHYSICAL:  Ms. Jasmer is a 77 year old female  with multiple medical issues who presents complaining of nausea,  vomiting, fevers and chills since a.m. on the day of admission.  On the  day of admission she was able to eat breakfast, quickly became nauseous  and vomited nonbloody emesis x1.  She developed left-sided chest pain  and shortness of breath at that time.  The chest pain was nonexertional  and was intermittent.  Additionally she complained of fevers, chills,  diaphoresis and fatigue.  Temperature taken at home was 100.0.   On exam temperature was 98.9, blood pressure 88/50, pulse 91, O2 sat 98%  on 3 liters.  The patient was in no acute distress.  Lungs were clear to  auscultation.  Heart sounds were distant but regular rate and rhythm.  Abdomen was soft and nontender with hypoactive bowel sounds.  Abdomen  was protuberant with vacuum dressing on lower portion of abdomen.  Patient did  have CVA tenderness on left flank.  There was 2+ pitting  edema bilaterally.  Strength was 5/5 throughout.  Cranial nerves intact.  Neuro exam nonfocal.   Labs:  Sodium 137, potassium 3.8, chloride 95, bicarb 30, BUN 11,  creatinine 0.9, glucose 182, bilirubin 0.7, alkaline phosphatase 119,  AST 25, ALT 20, protein 6.8, albumin 3.2, calcium 9.4.  White blood  cells 27.2, hematocrit 42.6, hemoglobin 14.3, platelets 299.  UA showed  cloudy urine with large blood, moderate leukocyte esterase, negative for  nitrites, 21 to 50 white blood cells, too numerous to count red blood  cells, many bacteria.  Point of care markers were negative x1.   HOSPITAL COURSE BY PROBLEMS:  1. Nausea and vomiting most likely secondary to pyelonephritis.  The      patient was rehydrated with normal saline and by the day of      discharge was tolerating food well.  2. Pyelonephritis.  Urine culture showed E. coli which was resistant      to Bactrim.  The patient had initially been treated with Zosyn, and      upon gaining the sensitivities for her specific organisms she was      switched to [facility], also called cefpodoxime.  3. MRSA positive culture from abdominal wound.  This was investigated      because of erythema and discomfort around the wound with suspected      cellulitis component which had not been present on previous exams      by [facility].  Culture grew MRSA.  PICC line was placed      and the patient was treated with 7 days of vancomycin.  4. History of atrial fibrillation.  The patient was controlled      throughout the hospitalization and is on amiodarone.  5. Hypertension.  The patient was controlled on home medications.      This can be reevaluated in the outpatient setting.  6. Diabetes.  The patient claims she is not on any antihyperglycemic      medications at home.  Blood sugars ranged from 180s to 140s during      this hospitalization.  She will be following up with her primary       care Bobbi Kozakiewicz regarding her diabetes.  A hemoglobin A1c was      performed during this hospitalization which was 7.1, which is high.  7. Hypothyroid.  TSH performed during this hospitalization was 1.030      which is normal.  The patient was maintained on her home dose of      Synthroid.  8. Hyperlipidemia.  There was no lipid panel performed during this      hospitalization.  The patient was maintained on her home      medications.  9. Physical deconditioning.  The patient was evaluated by physical      therapy in the hospital.  It was determined that she did not need      in-hospital physical therapy.  A walker was brought to her bedside.      The patient was encouraged to walk up and down the hallway.  She      will be followed by [facility] for her abdominal wound, as      well as her MRSA infection.  The patient would benefit as well from      continued physical therapy.   On the day of discharge temperature was 97.4, pulse 74, respirations 20,  blood pressure 160/90, the patient was satting 97%.  White blood cell  count 6.2, hemoglobin 10.6, hematocrit 32.5, platelets 250, PT 18.7, INR  1.5, sodium 140, potassium 3.5, chloride 103, bicarb 32, BUN 11,  creatinine 0.5, glucose 129.  Other labs of significance during this  hospitalization include a C. diff toxin which was negative and blood  cultures x2 which were negative.   FOLLOWUP:  Dr. Aundria Rud in the [facility] with whom Ms.  Trefz will be following up on August 27 at 3:30 p.m.      Elby Showers, MD  Electronically Signed     ______________________________  Eliseo Gum, M.D.    CW/MEDQ  D:  10/17/2006  T:  10/18/2006  Job:  [id number]

## 2010-07-10 NOTE — Discharge Summary (Signed)
NAMECUBA, NATARAJAN              ACCOUNT NO.:  [id number]   MEDICAL RECORD NO.:  [id number]          PATIENT TYPE:  INP   LOCATION:  6736                         FACILITY:  [facility]   PHYSICIAN:  Olene Craven, M.D.  DATE OF BIRTH:  07-21-1932   DATE OF ADMISSION:  12/31/2006  DATE OF DISCHARGE:  01/06/2007                               DISCHARGE SUMMARY   ADDENDUM TO DISCHARGE SUMMARY:  The patient's discharge date has been postponed to today, which is  January 06, 2007.  The patient was kept overnight in order for [place] to  draw a vancomycin trough.  The patient's vancomycin trough was 15.4.  Pharmacy had recommended that we achieve a level of 10.0 to 15.0.  As  she is above this range, they have recommended that we change her  vancomycin dosing from vancomycin 1250 mg IV b.i.d. to vancomycin 1750  mg IV once daily.  She will be followed up with home health and they  will take care of her vancomycin levels when she is discharged.      Olene Craven, M.D.  Electronically Signed     MC/MEDQ  D:  01/06/2007  T:  01/06/2007  Job:  [id number]

## 2010-07-10 NOTE — Discharge Summary (Signed)
NAMESHAWNTEE, [name] NO.:  [id number]   MEDICAL RECORD NO.:  [id number]          PATIENT TYPE:  INP   LOCATION:  3715                         FACILITY:  [facility]   PHYSICIAN:  Alvester Morin, M.D.  DATE OF BIRTH:  26-May-1932   DATE OF ADMISSION:  05/04/2007  DATE OF DISCHARGE:  05/08/2007                               DISCHARGE SUMMARY   DISCHARGE DIAGNOSES:  1. Pyelonephritis.  2. Pneumonia.  3. Coronary artery disease with myocardial infarction 20 years ago.  4. Atrial fibrillation, on Coumadin.  5. Hypothyroidism.  6. Diabetes mellitus with last hemoglobin A1c of 6.3.  7. Hyperlipidemia.  8. Depression.  9. Constipation.  10.Gout.  11.History of deep vein thrombosis/pulmonary embolism in 1998.  12.Morbid obesity.  13.Stress incontinence.  14.Gastroesophageal reflux disease.  15.Prior cerebrovascular accident.  16.Abdominal wall cellulitis in November 2008 with methicillin-      resistant Staphylococcus aureus.  17.Ventral hernia, status post repair in April 2008.  18.Congestive heart failure with diastolic origin, last ejection      fraction of 55% in left ventricular hypertrophy seen on 2D echo      performed in August 2008.  19.History of gastric bypass surgery.   DISCHARGE MEDICATIONS:  1. Lipitor 40 mg p.o. daily.  2. Omnicef 300 mg 1 pill twice a day for 10 days.  3. Azithromycin 250 mg p.o. once daily for 5 days.  4. Synthroid 175 mcg daily.  5. Albuterol inhaler 1 to 2 puffs every 4  hours as needed.  6. Colchicine 0.6 mg 1 pill twice daily.  7. Cardizem SR 120 mg p.o. daily.  8. Benadryl 25 mg 1 pill at bedtime.  9. Singulair 10 mg 1 p.o. daily.  10.Prozac 20 mg 1 p.o. daily.  11.Omeprazole 20 mg 1 p.o. daily.  12.Advair 250/50 mcg 1 puff twice daily.  13.Spiriva 18 mcg 1 inhalation daily.  14.Tylenol Extra Strength as needed every 6 hours.  15.Lasix 20 mg 1 pill daily.  16.Coumadin per Dr. Michaell Cowing.  17.Colace 240 grams p.o. daily.  18.Klor-Con 20 mEq 1 pill daily.  19.Cozaar 50 mg 1 daily.  20.Zinc 220 mg 1 pill twice daily.  21.Magnesium oxide 400 mg p.o. twice daily for 7 days.   DISPOSITION FOLLOWUP:  The patient will see Dr. Beverely Pace on May 22, 2006, at 2 p.m. and will need to be followed up on regards to her UTI  and her pneumonia.  The patient will also follow with Dr. Michaell Cowing in  regards to her Coumadin therapy.  She will see him on May 18, 2007, at  11:45.   CONSULTATIONS:  None.   PROCEDURE PERFORMED:  Chest x-ray performed, May 04, 2007, indicating  patchy bilateral infiltrate consistent with pneumonia.   BRIEF ADMITTING HISTORY AND PHYSICAL:  This is a 77 year old female with  history of diabetes mellitus; COPD, on home O2; atrial fibrillation, on  Coumadin; and hyperlipidemia who comes in for 3 days of fatigue,  confusion, left-sided flank pain, dysuria, and hematuria.  Prior to  these last few days, she has been feeling at baseline.  She denies any  worsening of  her shortness of breath, any recent coughs or chest pain.  She does endorse recent nausea, but has not had any vomiting.  She has  only been able to tolerate liquids for the last 3 days.   PHYSICAL EXAMINATION:  VITAL SIGNS:  Admitting vitals, temperature 99.6,  blood pressure 136/70, pulse 88, respirations 20, and saturating 92% on  2 L.  GENERAL:  The patient is obese.  She has fairly audible upper airway  wheezes.  HEENT:  Eyes, extraocular eye movements intact, anicteric.  Pupils  equal, round, and reactive to light.  No oropharyngeal erythema or  exudates.  NECK:  No JVD or lymphadenopathy.  RESPIRATORY:  Lungs have decreased air movements especially in the  bases.  No extra breath sounds heard.  GASTROINTESTINAL:  Soft, nontender, and nondistended.  Protuberant.  Good bowel sounds.  EXTREMITIES:  A +1/3 edema bilaterally.  SKIN:  No rash is seen.  NEUROLOGIC:  Nonfocal exam.  Cranial nerves II through XII are intact.  Muscle  strength +1 in all extremities.  No gross sensory deficits.  The  patient is oriented x3.  She is generally appropriate, but she does have  moments of confusion especially with recent events at her home.  She  cannot recall exactly when she became sick.  She is having difficulty  remembering exactly which medications she is taking recently and the  names of certain people who have helped her to get to  the hospital.  Although, she is oriented she is clearly confused about several things.  Otherwise, she is awake and alert.   LABORATORY DATA:  Sodium 137, potassium 4.2, chloride 95, bicarb 32, BUN  35, creatinine 1.45, and glucose 154.  White count 25.5, hemoglobin  12.8, and platelets 221,000.  UA shows many bacteria, rbc's 7 to 10,  white blood cells too many to count, nitrites negative, and large  leukocyte esterase.  INR is 3.1.   HOSPITAL COURSE BY PROBLEM:  1. Pyelonephritis.  The patient had significant confusion and a      history of hematuria, flank pain.  We gave the patient IV fluids to      rehydrate her in addition to giving her Rocephin initially.  The      last time the patient had urinary tract infection, she had      Escherichia coli that was resistant to ciprofloxacin and Bactrim.      It was sensitive at that time to Rocephin.  We chose this as the      initial antibiotic of choice.  Over the course of the next 24-hours      with fluids and antibiotics, the patient's mental status did clear.      Her fevers did not recur.  Her white count trended down to 14.9.      She was much less confused after 24 hours.  In regards to her      pyelonephritis, her symptoms continued to improve during the course      of her stay.  Her urinary culture came back Escherichia coli      sensitive to Rocephin.  Alternately, we gave the patient another      third-generation cephalosporin.  She will take Omnicef for 10 days      after discharge.  2. Pneumonia.  The patient had increased  work of breathing upon      admission.  She was sating well at her usual oxygen need at home.  She did not have a productive cough; however, she did have      decreased breath sounds bilaterally in addition to patchy      infiltration on her chest x-ray.  We treated the patient for      community-acquired pneumonia with Rocephin and azithromycin.  She      will continue with azithromycin for 5 days post discharge.  As far      as her pulmonary function during the course of her stay, her work      of breathing continuously improved.  By the time of discharge, she      was sating 97% on 2 L. She has home O2.  3. Atrial fibrillation.  The patient has paroxysmal atrial      fibrillation.  During the course of her stay, majority of the time      she was in sinus rhythm.  She was maintained on Coumadin and her      INRs were continuously therapeutic.  She will follow up by Dr.      Michaell Cowing in regards to her INR checks post discharge.  We maintained      her on Cardizem in order to rate control her and she generally had      heart rates between 70 to 100 during hospital stay.  She was doing      well in regards to this problem.  4. Hypertension.  The patient has significant hypertension.  We held      her Cozaar and Lasix initially during her hospital stay secondary      to her infections and possible sepsis.  We did continue her      Cardizem for heart rate control, given her atrial fibrillation.  By      the time of discharge, again she was significantly better in      regards to her infection and sepsis had cleared.  Vital signs were      stable.  Blood pressure systolic was as high as 159.  We ended up      restarting the patient's blood pressure medications upon discharge.      She will need to be followed up with this at the outpatient clinic      internal medicine in future.  5. Acute renal failure.  The patient had showed up with creatinine of      1.45.  She also had an elevated BUN.   She appeared to be dehydrated      and upon re-hydrating, her acute renal failure resolved.  Upon      discharge, her creatinine was 0.83.  6. Diarrhea.  The patient has some mild abdominal pain and diarrhea      during the course of her stay.  It was most likely related to      antibiotics.  She did have constipation initially when she was      admitted.  She was given Colace and a bisacodyl suppository at that      time.  It is probable that these two treatments also contributed to      her subsequent diarrhea.  Now, the patient's vital signs remained      stable and her diarrhea was self-limited.  She will be followed up      on these symptoms as an outpatient.   DISCHARGE LABS:  Sodium 137, potassium 3.3, chloride 99, bicarb 34, BUN  16, creatinine 0.75, and glucose 170.  White count 7.6,  hemoglobin 10.9,  and platelets 226,000.      Lollie Sails, MD  Electronically Signed      Alvester Morin, M.D.  Electronically Signed    CB/MEDQ  D:  05/13/2007  T:  05/14/2007  Job:  [id number]   cc:   Ardeth Sportsman, MD

## 2010-07-10 NOTE — Consult Note (Signed)
[name], [name]              ACCOUNT NO.:  [id number]   MEDICAL RECORD NO.:  [id number]          PATIENT TYPE:  INP   LOCATION:  1825                         FACILITY:  [facility]   PHYSICIAN:  Ollen Gross. Vernell Morgans, M.D. DATE OF BIRTH:  1933-02-13   DATE OF CONSULTATION:  07/14/2006  DATE OF DISCHARGE:                                 CONSULTATION   HISTORY OF PRESENT ILLNESS:  [name] is a 77 year old white female  who is now about 1 month out from a laparoscopic ventral hernia repair  with mesh by Dr. Michaell Cowing.  She has a lot of medical problems including a  requirement for Coumadin anticoagulation and she postoperatively  developed a large subcu hematoma.  She has had a little bit of skin  breakdown on her panus, which has precipitated drainage of this hematoma  and she has apparently had a large amount of bloody drainage from the  area and comes to the emergency department for this.  In the ER her INR  is 3.  She has not really complained of any pain.  She did have some low  grade fevers over the last few days as well as some nausea and vomiting,  but she has had bowel movements and passed some flatus.  She denies any  chest pains or shortness of breath right now.  No diarrhea or dysuria.  She apparently recently went to the urologist who also put her on an  antibiotic, but she is not sure what kind of antibiotic for a urinary  problem.   PAST MEDICAL HISTORY:  Significant for:  1. Atrial fibrillation.  2. Congestive heart failure.  3. COPD.  4. Diabetes.  5. Stroke.  6. Myocardial infarction.   PAST SURGICAL HISTORY:  Significant for laparoscopic ventral hernia  repair a month ago.   MEDICATIONS:  Include:  1. Advair.  2. Amaryl.  3. Cardizem.  4. Colchicine.  5. Coumadin.  6. Cozaar.  7. Lasix.  8. Levothyroxine.  9. Lipitor.  10.Potassium.  11.Prozac.  12.Singulair.  13.Spiriva.   ALLERGIES:  NO KNOWN DRUG ALLERGIES.   SOCIAL HISTORY:  She denies any  current use of alcohol or tobacco  products.   FAMILY HISTORY:  Noncontributory.   PHYSICAL EXAMINATION:  VITAL SIGNS:  Her temperature is 98.7, blood  pressure 113/60, pulse of 80.  GENERAL:  She is an elderly white female, morbidly obese, in no acute  distress.  SKIN:  Warm and dry.  No jaundice.  EYES:  Her extraocular movements are intact.  Pupils equal, round,  reactive to light.  Sclerae nonicteric.  LUNGS:  Clear bilaterally with no use of accessory muscles.  HEART:  Irregular.  With an impulse in the left chest.  ABDOMEN:  Soft.  She does have a large panus.  Her panus has some  redness and induration to it.  The worst of the induration and redness  in on the left side.  She does have about a dime size hole just to the  left of midline on her panus that is draining some old dark watery  looking blood.  EXTREMITIES:  She does have some slight edema of her lower extremities.  PSYCHOLOGICALLY:  She is alert and oriented x3 with no evidence of  anxiety and depression.   LABORATORY DATA:  On review of her laboratory work she was noted to have  a white count of 11.3 and a hemoglobin of 9.1.   ASSESSMENT:  This is a 77 year old white female with some significant  medical problems who also has what appears to be a subcu hematoma after  laparoscopic ventral hernia repair with mesh surgery about a month ago.  I would recommend holding her anticoagulants if possible to try to stop  any oozing that may be occurring.  I would agree with admission and IV  antibiotic therapy.  We will plan to watch her panus.  Would not plan  any operative debridement at this time because of the risk of further  bleeding and infection of the mesh.  I will transfuse her as necessary.  The medical team will plan to admit to manage her medical problems and  we will follow closely as needed.      Ollen Gross. Vernell Morgans, M.D.  Electronically Signed     PST/MEDQ  D:  07/14/2006  T:  07/14/2006  Job:   [id number]

## 2010-07-10 NOTE — Discharge Summary (Signed)
NAMEJOHN, VASCONCELOS              ACCOUNT NO.:  [id number]   MEDICAL RECORD NO.:  [id number]          PATIENT TYPE:  INP   LOCATION:  4730                         FACILITY:  [facility]   PHYSICIAN:  Acey Lav, MD  DATE OF BIRTH:  Jul 23, 1932   DATE OF ADMISSION:  01/20/2007  DATE OF DISCHARGE:  01/21/2007                               DISCHARGE SUMMARY   ATTENDING:  Acey Lav, MD   CONSULTATIONS:  There were no consultations on this patient during her  hospital stay.   DISCHARGE DIAGNOSES:  1. Peripherally inserted catheter malposition.  2. Abdominal cellulitis.  3. Measured hemoglobin of 6.9.  4. Recurrent atrial fibrillation on warfarin therapy.  5. Hypertension.  6. Chronic respiratory failure.  7. Hypothyroidism.  8. Diabetes mellitus.  9. Hyperlipidemia.  10.Gout.  11.Depression.  12.Chronic constipation.  13.Coronary artery disease with history of myocardial infarction 20      years ago.  14.History of deep vein thrombosis and pulmonary embolus in 1998.  15.Morbid obesity.  16.Previous surgical repair of ventral hernia.  17.Urinary stress incontinence.  18.Gastroesophageal reflux disease.  19.Prior cerebrovascular accident.   DISPOSITION:  The patient has an appointment with Dr. Karie Soda,  general surgery January 30, 2007, at 1:03 as a makeup from a previously  scheduled appointment that she missed while she was in the hospital and  is not a followup.  As she was admitted for the sole purpose of  replacing her PICC line, we are not scheduling her hospital followup  although she does have a stated appointment already scheduled for early  to mid-December with her primary care Satoru Milich in the outpatient  resident clinic.  As she had no acute issues other than that while in  the hospital and as she ended up being discharged without a PICC line,  there are no labs or followup from this hospitalization that need to be  followed up on.  However, of  course, her extensive medical history  requires plenty of other things that need to be followed.   PROCEDURE PERFORMED:  Miss Reha had an adjustment of her PICC line  performed on the 24th and then later that evening she had an exchange of  a new PICC line placed followed by an x-ray to verify correct placement  and then on the day of discharge, the 25th she had removal of her PICC  line.   CONSULTATIONS:  There were no consultations ordered during this hospital  admission.   BRIEF HISTORY:  Miss Ragon is a 77 year old white female with an  extensive and complicated medical history who was last discharged from  [facility] on January 06, 2007, with a PICC line in place for  home vancomycin treatment for MRSA abdominal wall cellulitis.  She also  has a chronic nonhealing wound present since spring perhaps 1 cm x 2 cm  in her left lower quadrant.  Her home health nurse took a sample of  blood for hemoglobin on January 18, 2007, which came back on January 19, 2007, read as a hemoglobin of 6.9.  She was then brought  to the  resident clinic where they did a repeat hemoglobin which was measured as  11.7, however, at that visit the patient's also admitted that she had  inadvertently partially removed her PICC line while flushing it the  previous day.  The patient was admitted for the sole purpose of  replacing the PICC line which requires an admitted status.  Hemoglobin  obtained after admission was 11.8, sodium 134, potassium 4.0, chloride  95, bicarb 30, BUN 12, creatinine 0.68, glucose 147.  Bilirubin 0.9,  alkaline phosphatase 105, ALT 17, AST 11, protein 7.0, albumin 3.0,  calcium 9.6, low white count 7.0, and platelets 360, INR therapeutic at  2.6, PTT 46.   HOSPITAL COURSE:  Problem 1.  PICC line for abdominal wall cellulitis.  The patient's hospital course was rather uneventful.  The patient's  hospital stay was rather uneventful.  She was admitted on the  afternoon  of the 24th in order to get an exchanged PICC line to finish out her  last few days of home vancomycin therapy.  The PICC line was placed late  in the evening of that day.  The next morning on round, Dr. Daiva Eves, our  attending who was also an infectious disease trained specialist,  examined her. Dr. Daiva Eves had also seen her at her previous admission.  Upon inspection of her belly and in discussions with the patient, he was  of the opinion that the cellulitis was resolved and had been resolved  for a number of days and decided that she had received an adequate and  appropriate course of vancomycin and no longer needed antibiotics.  Therefore the antibiotics were discontinued as well as the nystatin  grain that she was placing on her cellulitis.  The PICC line was removed  and Miss Burkholder was discharged home.  Problem 2.  Low hemoglobin reading of 6.9, that brought her to the  clinic in the first place, apparently was an aberrant reading most  likely secondary to prolonged storage or improper transportation.  All  subsequent hemoglobin measurements performed at the clinic and in the  hospital were well above 6.9, 11.7 and above.  Problem 3.  Extensive medical  history.  Miss Lister did not have any  acute issues upon admission and was maintained on her entire home  regimen of medications, and she was maintained on all the same  medications upon discharge with the exception of no vancomycin and no  nystatin cream as well as the addition of MiraLax .  Problem 4.  Constipation.  The 1 complaint she did have while admitted  was of continued constipation despite Senokot S 2 tablets at night and 1  tablet in the morning.  Thus the patient was given an additional  prescription for MiraLax 1 heaping tablespoon mixed in 4 to 8 ounces of  liquid daily upon discharge in addition to her Senokot S.  This seems to  be a chronic problem for Miss Harnois, once it may require further   require adjustments in her medication and treatment plan in the future.   On the day of discharge vitals:  Miss Longbottom's vitals were stable  throughout admission.  She on the day of discharge she had a temperature  of 97.9, pulse 70, respirations 22, blood pressure 158/63, O2 saturation  98% on 2 liters, nasal cannula.  Miss Tauer has an oxygen requirement  at home.   On the day of discharge labs:  White count 6.3, hemoglobin 10.7,  platelets 301, TSH of 1.488, INR 2.5, sodium 136, potassium 3.7,  chloride 96, bicarb 33, glucose 133, BUN 10, creatinine 0.86, calcium  8.8.   DISCHARGE MEDICATIONS:  1. Albuterol 2 puffs 3 times a day as needed.  2. Atorvastatin 4 mg daily.  3. Colchicine 0.6 mg daily.  4. Diltiazem 120 mg daily.  5. Fluoxetine 20 mg daily.  6. Lasix 20 mg daily.  7. Synthroid 200 mcg daily.  8. Singulair 10 mg daily.  9. Senokot/docusate 2 tablets at night and 1 in the morning.  10.MiraLax 1 heaping tablespoon in 4 to 8 ounces of liquid daily.  11.Spiriva 18 mcg daily.  12.Coumadin 5 mg daily.  13.Ambient 5 mg at night as needed for insomnia.  14.Advair 250/150 one puff daily.   WOUND CARE:  Miss Pryce also has the nonhealing chronic wound in her  left lower abdomen that has been present since at least the spring for  which she is followed by Dr. Karie Soda after which she has had home  health wound care consistently.  She is to continue with the appropriate  wound care as before as prescribed by Dr. Michaell Cowing.  No changes to her  wound care at this time.     ______________________________  Philbert Riser, MS IV      Acey Lav, MD  Electronically Signed    RS/MEDQ  D:  01/21/2007  T:  01/22/2007  Job:  [id number]   cc:   Ardeth Sportsman, MD

## 2010-07-10 NOTE — Consult Note (Signed)
[name], [name]              ACCOUNT NO.:  [id number]   MEDICAL RECORD NO.:  [id number]          PATIENT TYPE:  INP   LOCATION:  6736                         FACILITY:  [facility]   PHYSICIAN:  Ardeth Sportsman, MD     DATE OF BIRTH:  October 25, 1932   DATE OF CONSULTATION:  01/01/2007  DATE OF DISCHARGE:                                 CONSULTATION   PRIMARY CARE PHYSICIAN:  C. Ulyess Mort, M.D.   SURGEON:  Ardeth Sportsman, M.D.   REQUESTING PHYSICIAN:  Internal medicine B service.   REASON FOR CONSULTATION:  Status post central hernia repair with  recurrent urinary tract infection and large open wound and new  cellulitis and probable recurrent urinary tract infection.   HISTORY OF PRESENT ILLNESS:  [name] is a 77 year old morbidly  obese female with numerous medical histories including for COPD.  She is  on chronic anticoagulation with history of strokes and hemiparesis,  diabetes, hypothyroidism, etc, who had recurrent episodes of  incarceration and underwent a laparoscopic ventral hernia repair in  April 2008.  This was complicated by postoperative bleeding since she  was returned on her anticoagulation with a giant hematoma that had to be  evacuated through an abdominal wound.  She had been switched over to  wound VAC and closed this area down over time and only has a small wound  at this point packed with a wick.  She had been gradually improving  overall.  She has returned to the hospital with her current episodes of  urinary tract infections of E. coli which has some moderate resistance.  When I had seen her last month, she was doing well  and closing down;  however, she notes about 1 to 2 days prior to admission she started  having dysuria and pain with change in the odor of her urine and started  developing a redness around her panniculus.  She was seen in our  surgical office and feeling was she had cellulitis with recurrent  urinary tract infection .  Given her  numerous medical problems she was  admitted to the medicine service and we have been asked to follow with  her.   PAST MEDICAL HISTORY:  1. Recurrent urinary tract infections.  2. E. coli.  3. Methicillin-resistant Staphylococcus aureus positive cultures from      abdominal wound in the past.  Most likely colonization with      possible infection.  4. History of atrial fibrillation with strokes.  5. Hypertension.  6. Diabetes.  7. Hypothyroidism.  8. Hyperlipidemia.  9. History of pulmonary embolism, deep vein thrombosis, status post      inferior vena cava placement in the distant past.  10.Depression.  11.Anxiety.  12.Morbid obesity.  13.Gout.  14.Grave disease status post radioablation with secondary      hypothyroidism.   PAST SURGICAL HISTORY:  1. She had a [place] filter placed in 1998.  2. She has had open cholecystectomy and gastric plication many years      ago.  3. Breast biopsy.  4. Laparoscopy lysis of adhesions and ventral wall incisional  hernia      repair x13 on June 12, 2006.   MEDICATIONS:  Noted in the chart and include aspirin, Lipitor,  Colchicine, guaifenesin, Diltiazem, Benadryl, Prozac, Advair, Synthroid,  Spiriva, omeprazole, Lasix.  She is also on Cozaar as well.   ALLERGIES:  ROFECOXIB , METFORMIN and some NSAIDS.   SOCIAL HISTORY:  She is widowed.  A retired Engineer, civil (consulting).  She lives with their  son in [place].  She is a former smoker with about a 40-pack year  history of tobacco.  No alcohol or other drug use.   FAMILY HISTORY:  Noncontributory for any major cellulitis or other  issues.   REVIEW OF SYSTEMS:  As noted per HPI.  GENERAL:  She does have some  fatigue but no recent night sweats or weight loss.  HEENT/CARDIOVASCULAR: Negative.  RESPIRATORY:  Some mild cough but no  significant sputum production.  Some mild dyspnea.  GI:  Some mild  constipation that is controlled.  No nausea, vomiting or diarrhea.  She  has some mild abdominal  pain below her umbilicus and near the area of  her wound but that has been stable and not worsening.  GU:  As noted per  HPI, with dysuria but no hematuria.  DERMATOLOGIC:  There is redness  around her abdomen but no other rashes or itching.  ENDOCRINE:  No heat  or cold intolerance.  No polyuria, polydipsia.  No easy bruising or  lymphadenopathy.  MUSCULOSKELETAL:  Just some mild joint soreness in her  knees and hips but these are relatively stable with no new episodes of  back pain or joint pain.  NEUROLOGICAL:  She does have some hearing loss  but that has been relatively stable.  No weakness or numbness.  PSYCHIATRIC:  No suicidal or homicidal ideas or hallucinations.   PHYSICAL EXAMINATION:  VITAL SIGNS:  Her T-max is 99.9, pulse 89,  respirations 20, blood pressure 158/72.  Saturation 94% on 2 liters.  GENERAL:  She is a well-developed, well-nourished morbidly obese female  resting in no acute distress.  PSYCHIATRIC:  She is post interactive with at least average  intelligence.  No evidence of dementia, degenerative psychosis or  paranoia.  HEENT:  She is normocephalic, mucous membranes are moist.  Nasopharynx  is clear.  Oropharynx is clear.  Eyes:  She wears glasses.  Pupils  equal, round, reactive to light.  Extraocular movements are intact.  NECK:  Supple without any masses.  No thyromegaly.  LYMPHS:  No head, neck, axillary or groin lymphadenopathy.  LUNGS:  Clear to auscultation bilaterally.  No wheezes, rales or  rhonchi.  HEART:  Irregularly irregular but no murmurs, clicks or rubs.  ABDOMEN:  Morbidly obese but soft.  She has an area of obvious  cellulitis.  Please see skin for details.  She has a 1 cm circular  opening in the lateral part of her left lower quadrant from her chronic  wound and it goes about 4 cm deep.  There is no retained foreign body  within it nor packing.  It has excellent granulation tissue.  GENITALIA:  Normal external female genitalia but she does  have an  obvious foul urinary odor.  EXTREMITIES:  No significant clubbing or cyanosis.  She does have some  edema.  RECTAL:  Deferred.  SKIN:  On her abdomen she has a 20 x 50 cm region of erythema and some  tenderness consistent with a probable cellulitis.  It is warm and red.  LABORATORY DATA:  Initial white count was 26.3, today it is 17.7.  Urinalysis is pending but the primary urinary cultures have grown E.  coli that has been moderately resistant to cephazolin but sensitive to  ceftriaxone.  Her potassium was 3.3   ASSESSMENT/PLAN:  A 78 year old morbidly obese female with numerous  medical issues with probable recurrent urinary tract infection and  secondary cellulitis.  1. I agree with doing vancomycin to cover any possible methicillin-      resistant Staphylococcus aureus, since she did have colonization      with that in the past.  2. I would switch her cephazolin to ceftriaxone since the E.coli seems      to be sensitive to that.  I will defer to medicine on that if they      agree.  I wonder if she needs chronic antibiotic suppression      therapy with Macrodantin since it seems to be sensitive to that but      I will defer to medicine.  Perhaps an infectious disease consult is      warranted if this does not resolve easily, although the lower white      count and the not worsening situation is encouraging.  3. Pack her wound with gauze daily.  4. CT scan of the abdomen and pelvis to make sure there is no deep      abscess or fluid collection that we are missing.  5. Diabetic control.  6. Blood pressure control.  7. Anticoagulation.  Keep her fully anticoagulated for now.  She is      poor operative candidate and I do not think she needs to be      actively reversed at this time.  8. Chronic obstructive pulmonary disease, stable.  Continue      bronchodilators, previous Singulair.  9. Continue Synthroid for hypothyroidism.  10.Continue Colchicine for probable  gout.  11.Urinary incontinence.  She is apparently on Enablex for this.  We      will follow this.  12.Hypercholesterolemia. Continue Lipitor for now and follow.  13.We will follow with medicine.   If there is evidence of a deeper abscess or fluid collection, we might  recommend percutaneous trench and keep her on IV antibiotics for 6  weeks.  She is a very poor operative candidate and I do not think  removal of mesh would be warranted.  Another possibility is a urological  consultation to see why she keeps getting these recurrent urinary tract  infections to make sure we are not missing anything.      Ardeth Sportsman, MD  Electronically Signed     SCG/MEDQ  D:  01/01/2007  T:  01/01/2007  Job:  [id number]

## 2010-07-10 NOTE — Discharge Summary (Signed)
[name], [name]              ACCOUNT NO.:  [id number]   MEDICAL RECORD NO.:  [id number]          PATIENT TYPE:  IPS   LOCATION:  4033                         FACILITY:  [facility]   PHYSICIAN:  Ellwood Dense, M.D.   DATE OF BIRTH:  05/22/1932   DATE OF ADMISSION:  06/25/2006  DATE OF DISCHARGE:  07/04/2006                               DISCHARGE SUMMARY   DISCHARGE DIAGNOSES:  1. Deconditioning past ventral hernia repair.  2. Postoperative complications of subcutaneous hematoma with anemia.  3. Diabetes mellitus type 2.  4. Restrictive lung disease.  5. History of pulmonary embolus.  6. Multiple abdominal operations, healing.  7. Diarrhea, resolved.  8. Urinary retention.  9. Hypertension.   HISTORY OF PRESENT ILLNESS:  [name] is a 77 year old female with  history of diabetes mellitus, morbid obesity, a ventral abdominal hernia  with recent small bowel obstruction (SBO) near strangulation requiring  hospitalization January 2008, admitted June 02, 2006, for elective  ventral hernia repair with lysis of adhesions by Dr. Michaell Cowing.  Postop  course complicated by anemia requiring total of 8 units packed red blood  cells secondary to postop large hematoma in pelvis and subcu tissues.  She has had issues of urinary retention requiring replacement of Foley  and also has had issues with diarrhea and peripheral edema.  Mobility is  currently impaired and the patient is noted be deconditioned with  decreased balance at edge of bed, continues to have issues regarding  pain in abdominal area, Foley has been discontinued on June 25, 2006,  with another attempt at voiding trial.  Rehab consult for further  therapies.   PAST MEDICAL HISTORY:  Significant for chronic atrial fibrillation, CVA  in 1997, noncritical coronary artery disease, nonischemic  cardiomyopathy, restrictive lung disease, O2 dependent, diabetes  mellitus type 2 with diabetic neuropathy, multiple abdominal surgeries  in the past, recent SBO, history of DVT with IVC filter and history of  PE, history of pulmonary alveolitis, gout, macular degeneration, chronic  UTIs, endometrial hyperplasia and history of gastric plication.   ALLERGIES:  GLUCOPHAGE AND NSAIDS.   FAMILY HISTORY:  Positive for CVA.   SOCIAL HISTORY:  The patient is a retired [employer] fundraiser, has a son living with her.  Has smoked for 50 years, quit 2 years ago, does not use any alcohol.  The patient has been sedentary using a wheeled stool at home,  occasionally walks 10 to 15 feet.   HOSPITAL COURSE:  [name] was admitted to rehab on June 25, 2006, for inpatient therapies to consist of PT/OT daily.  Past admission  she was maintained on Coumadin secondary to history of PE.  Voiding was  monitored with PVR checks and the patient was noted to have problems  with retention requiring in-and-out cath with volumes occasionally up to  500 mL.  The patient with a history of diarrhea.  Clostridium difficile  x2 checked have been negative.  Diarrhea has improved by time of  discharge.  Check of labs at admission revealed hemoglobin 8.6,  hematocrit 26.4, white count 9.3, platelets 496, check of lytes revealed  sodium 139, potassium 3.5, chloride 98, CO2 36, BUN 12, creatinine 0.55,  glucose 67.  LFTs showed low protein stores with total protein at 5.9,  albumin 1.9, otherwise within normal limits.  The patient was weaned off  70/30 Insulin.  Blood sugars have been checked on a.c. and h.s. basis  and are noted to be ranging from 100 to an occasional high in 200s,  therefore, Amaryl was increased to 6 mg p.o. per day.  Twenty four hours  prior to discharge blood sugars ranging from 110 to 160 range.  The  patient to continue monitoring blood sugars at a.c. and h.s. basis and  follow up with LMD for further adjustments of meds.   The patient was noted to have abdominal wounds from exploratory  laparotomy that were noted to be draining moderate  amount of  serosanguineous fluid.  Dry dressings were ineffective, therefore,  __________  was consulted for input.  They recommended Aquacel Silver  with changes every other day to help with wound healing.  Wound culture  was done showing multiple organisms and no Staph aureus isolated.  The  patient's right lower quadrant abdominal wound has healed.  She does  continue with two abraded areas on the left lower quadrant that is  currently being dressed with silver dressing.  Home health RN has been  arranged to continue to follow up with wound care management and  monitoring as well as dressing changes three times a week.  The  patient's p.o. intake has been good.  The patient did continue to have  issues with voiding and GU was consulted for input.  They will follow up  with the patient on outpatient basis.  The patient unable to do in-and-  out cath secondary to her girth and has elected to have indwelling Foley  placed for now.  She will have repeat voiding trial done on outpatient  basis.  Therapies were initiated and the patient has required much  encouragement, initially has been limited by fatigue and for endurance  levels.  Also limited by pain secondary to her panniculus pulling on her  abdominal wounds.  Abdominal binders are being used to hold her abdomen  and help with pain management in addition to p.r.n. use of oxycodone.  The patient did progress along well with therapy.  She is currently  modified independent for transfers, modified independent for ambulating  short distances however requires encouragement for ambulation and for  mobility.  She is modified independent for ADLs, requires assistive  equipment for toileting, requires min assist for toilet transfers.  Further follow-up therapies to include home health PT, OT as well as RN  by [facility] past discharge.  On Jul 04, 2006, the patient is discharged to home with son to provide intermittent supervision.    DISCHARGE MEDICATIONS:  1. Coumadin 5 mg one half pill p.o. q.p.m.  2. Advair 250/50 one puff b.i.d.  3. Spiriva one puff daily.  4. Levothyroxine 175 mcg a day.  5. Amaryl 4 mg one and one half p.o. per day.  6. Cardizem CD 120 mg a day.  7. Lasix 20 mg a day.  8. Ocuvite one per day.  9. Cozaar 100 mg a day.  10.__________  40 mg at bedtime.  11.Colchicine 0.6 mg a day.  12.Singulair 10 mg a day.  13.Prozac 20 mg a day.  14.K-Dur 20 mEq a day.  15.OxyIR 5-10 mg q.6-8 h. p.r.n. pain #75 Rx.  16.Tylenol 1000 mg  t.i.d. per home regimen.   DIET:  Diabetic diet, avoid salt.   SPECIAL INSTRUCTIONS:  Intermittent supervision with abdominal binder  for abdomen support when walking.  Do not use  Cozaar/hydrochlorothiazide.  Check blood sugars a.c. and h.s. basis and  record.  Attempt to stand and ambulate every couple of hours while at  home.  Follow up home health PT, OT, RN by [facility].   FOLLOW UP:  The patient to follow up with Dr. Vernie Ammons at [facility] Jul 10, 2006, at 12:45, follow up with Dr. Maple Hudson Jul 10, 2006,  at 9:15 a.m., follow up with Dr. Michaell Cowing for postop check in the next 7 to  10 days, follow up with Dr. Lamar Benes as needed.      Greg Cutter, P.A.    ______________________________  Ellwood Dense, M.D.    PP/MEDQ  D:  07/04/2006  T:  07/04/2006  Job:  [id number]   cc:   Dr. Elwin Mocha C. Vernie Ammons, M.D.  [facility]

## 2010-07-10 NOTE — Discharge Summary (Signed)
[name], [name]              ACCOUNT NO.:  [id number]   MEDICAL RECORD NO.:  [id number]          PATIENT TYPE:  INP   LOCATION:  6736                         FACILITY:  [facility]   PHYSICIAN:  Olene Craven, M.D.  DATE OF BIRTH:  13-Aug-1932   DATE OF ADMISSION:  12/31/2006  DATE OF DISCHARGE:  01/05/2007                               DISCHARGE SUMMARY   DISCHARGE DIAGNOSES:  1. Abdominal wall cellulitis.  2. Recurrent atrial fibrillation on chronic warfarin therapy, rate      controlled.  3. Chronic respiratory failure (obstructive and restrictive lung      disease).  4. Hypertension.  5. Hypothyroidism.  6. Diabetes mellitus, last A1c: .  7. Hyperlipidemia.  8. A history of myocardial infarction 20 years ago.  9. A history of deep vein thrombosis/pulmonary embolism in 1998,      status post inferior vena cava filter.  10.Major depressive disorder/generalized anxiety disorder.  11.Gout.  12.Morbid obesity.  13.Ventral hernia, status post repair in April 2008.  14.Recurrent urinary tract infections.  15.Methicillin-resistant staphylococcus aureus infection around      abdominal wound.  16.A history of congestive heart failure with ejection fraction 55%,      mild aortic regurgitation.  17.Status post gastric bypass surgery versus fundoplication (most      likely).  18.Stress urinary incontinence.   DISCHARGE MEDICATIONS:  1. Albuterol 2 puffs inhaled 3 times daily.  2. Atorvastatin 4 mg p.o. every day.  3. Colchicine 0.6 mg p.o. every day.  4. Diltiazem 120 mg p.o. every day.  5. Fluoxetine 20 mg p.o. every day.  6. Lasix 20 mg p.o. every day.  7. Synthroid 200 mcg p.o. every day.  8. Singulair 10 mg p.o. every day.  9. Nystatin applied to abdominal cellulitis once daily.  10.Senna/docusate 2 tablets by mouth at bedtime.  11.Spiriva 18 mcg inhaled daily.  12.Vancomycin 1250 mg IV daily for a total of 3 weeks.  13.Warfarin 5 mg by mouth daily.  14.Ambien 5 mg by  mouth at bedtime as needed for sleep.  15.Advair 1 puff inhaled twice daily.   CONSULTANTS:  1. Dr. Ulyess Mort.  2. Dr. Karie Soda.   PROCEDURES:  1. CT pelvis with contrast January 01, 2007.  2. CT abdomen with contrast January 01, 2007.  Impression:  No acute      abnormality, status post ventral hernia repair with mesh.  There      was a non-healing wound in the left abdomen containing some soft      tissue thickening and gas.  No residual fluid collections      identified.   ADMITTING HISTORY AND PHYSICAL:   CHIEF COMPLAINT:  Abdominal rash and dysuria.   HISTORY OF PRESENT ILLNESS:  Patient is a 77 year old lady with a past  medical history significant for recurrent UTI and ventral hernia status  post repair in April 2008 among other medical conditions, who presented  to outpatient medicine clinic on December 31, 2006 complaining of a  rash and dysuria.  She says the rash began 24 hours prior to the  clinic visit and  has progressed rapidly since then.  The patient says  she has been feeling a little tired and worn down for the last several  days with a diminished appetite.  In clinic, the patient was advised to  seek a surgery consult.  Surgery consult recommended inpatient  hospitalization for patient's cellulitis, which is presumed to be MRSA,  as she has had MRSA cellulitis in the past.  In addition, the patient  was diagnosed with UTI on December 31, 2006 after a UA was positive for  numerous bacteria.  The patient complained of inability to void despite  urgency.   PHYSICAL EXAM:  VITAL SIGNS:  Temperature 98.7, blood pressure 128/66,  pulse 86, respiratory rate 20, O2 saturation 93% on 2 liters nasal  cannula, weight 271 pounds, BMI of 57.  GENERAL:  Patient is in no acute distress.  EYES:  EOMI.  PERRLA.  ENT:  Moist mucous membranes.  NECK:  Supple, no thyromegaly or adenopathy.  RESPIRATORY:  Lungs:  Shallow left base crackles, greater than right  base,  decreased air movement.  CARDIOVASCULAR:  Regular rate and rhythm, positive S1, S2, and 2-3  holosystolic murmur best heard at right second intercostal space.  GI:  Positive bowel sounds.  Large approximately 12 x 24-inch  erythematous, amorphus area of cellulitis.  Tender to palpation in site  of mesh opening.  No discharge.  EXTREMITIES:  2+ pulses.  1+ pitting edema to mid leg.  Bilateral first  MTP tophi.  GU:  Deferred.  SKIN:  As documented on GI exam.  LYMPH:  No lymphadenopathy.  NEURO:  Cranial nerves II through XII grossly intact.  Alert and  oriented.  PSYCH:  Appropriate.   LABS:  Chemistry:  Sodium 132, potassium 3.3, chloride 94, CO2 of 30,  BUN 13, creatinine 0.74, glucose 167.   CBC:  White blood count 26.3, H/H 13.7/41.4, platelets 289, ANC 24.8,  MCV of 81.2, RDW 17.1.   Calcium 9.2.   UA--trace ketones, trace blood, cloudy, nitrite positive, leuk esterase  small, 3-6 white blood cells, many bacteria.   HOSPITAL COURSE:  Problem:  1. Abdominal wall cellulitis, likely secondary to non healing      abdominal wound that is still open and packed.  Patient endorsed      pruritus and frequent scratching which could be another port of      entry.  The wound was not draining purulent material.  There was no      evidence of complications such as necrosis, sepsis, or lymphangitic      spread.  No fevers suggestive of bacteremia.  We suspected MRSA      since wound grew it in the past, but the rapid progression seemed      to be more consistent with strep.  We admitted patient for IV      antibiotics, and vancomycin was added for MRSA coverage.  Ancef was      added for better strep coverage and possible urine coverage.  We      decided to draw blood culture if febrile.  IV antibiotics were      continued throughout her hospitalization.  On January 02, 2007, a      vancomycin trough was obtained and found to be 11.8.  We added      nystatin once daily applied to the  abdominal wound.  We want      patient to be treated with IV vancomycin for a total of 4 weeks.  We planned on discharging her with IV vancomycin coverage.  Surgery      was consulted for the abdominal wall cellulitis (they had already      seen her in the office and had sent her for admission).  They      agreed with using vancomycin to cover any possible MRSA since she      did have colonization with this in the past.  They advised to pack      her wound with gauze daily.  They also recommended a CT scan of the      abdomen and pelvis to make sure there was no deep abscess or fluid      collection that we were missing.  By the third day of antibiotic      coverage with vancomycin, patient's abdominal cellulitis seemed      much improved and had shrunk greatly compared to the original size.      She was no longer complaining of pain on day of discharge, and we      decided to discharge her on a home health IV vancomycin regimen for      the next 23 days.  2. A history of atrial fibrillation, on chronic Coumadin--patient was      continued on her outpatient dose of 5 mg of Coumadin to achieve a      goal INR of 2-3.  When drawn during this hospitalization, her INR      was 2.4, which was at goal.  We will discharge her on this dose.      She remained rate controlled throughout her hospital course.  3. Chest pain with a history of coronary artery disease.  Patient had      an episode of chest pain on the evening of January 02, 2007 while      the PICC line was being inserted.  This resolved a few minutes      after the intervention.  EKG at time of pain with sinusoidal T      waves in lateral precordium.  Rhythm was atrial fibrillation with      RVR.  Occasional PVCs were seen.  There was no acute ST-segment      elevation or depression seen.  Given the history of coronary artery      disease, we ordered cardiac enzymes x3.  The first set showed      increased troponin-I, but this has  been noted previously and is      admittedly difficult to interpret given patient's underlying      chronic respiratory disease.  A repeat EKG was recommended.  The      repeat EKG again showed atrial fibrillation but did not show any ST-      segment elevation or depression.  Cardiac enzymes were obtained.      Again, the troponin-I was elevated on the second set of enzymes at      0.19.  Her CK-MB and CK were normal.  The third set of enzymes      again showed normal CK and CK-MB with elevated troponin-I at 0.19.      This was again not considered to be significant for myocardial      ischemia or infarction.  4. Chronic respiratory failure.  Multifactorial. Patient's oxygen      saturation was maintained on 2 liters nasal cannula throughout her      hospitalization.  We continued Advair, Spiriva, Singulair, and  albuterol as she received on her regular outpatient regimen.  5. Hypertension.  The patient was continued on her outpatient regimen      of Cozaar, Lasix 20 mg p.o. daily, and Cardizem.  6. Major depressive disorder--patient was maintained on her normal      outpatient dose of Prozac.  7. Gout.  Patient was maintained on her normal outpatient dose of      colchicine.  8. DVT/PE prophylaxis--Coumadin was kept at 5 mg p.o. every day, and      the patient also had IVC filter placed a while ago, and this had      added protection.  9. Diabetes mellitus.  The patient was not on any diabetes regimen      when she came in to the hospital.  Her last hemoglobin Alc was at      goal, so her PCP has not had her take any medicines.  We did not      add anything during this hospitalization.  10.Urinary symptoms.  The patient came in with some dysuria and      urinary retention.  We obtained a urine culture with sensitivities.      A Foley was ordered if retention occurred.  We continued Enablex      for the first couple of days of this hospitalization, but due to      urinary  retention, we discontinued this medicine on January 01, 2007.  After this was discontinued, the patient was able to urinate      better.  We monitored ins and outs for evidence of retention.  11.Hyperlipidemia.  We continued Lipitor.  12.Hypothyroid.  Patient was maintained on her usual outpatient dose      of Synthroid at 200 mcg daily.  13.Constipation.  Patient was given Senna/docusate 2 tablets by mouth      at bedtime for constipation.  14.Insomnia.  Ambien 5 mg by mouth at bedtime as needed for sleep was      prescribed.   DISCHARGE LABS AND VITALS:  On January 04, 2007, a CBC and chemistry  panel was obtained:  CBC:  White blood count 6.4, H/H 10.9/33.2, MCV 82.6, platelets 260.  Sodium 140, potassium 3.8, chloride 92, CO2 of 34, BUN 15, creatinine  0.6, glucose 124.  January 05, 2007, PT equals 27.9, INR equals 2.5, urine culture no  growth, vancomycin trough on January 02, 2007 equal 11.8.  T max 99.0, systolic blood pressure 148, diastolic blood pressure 74,  pulse 73, respiratory rate 20-22, O2 saturation 94% on 2 liters nasal  cannula.      Olene Craven, M.D.  Electronically Signed     MC/MEDQ  D:  01/05/2007  T:  01/05/2007  Job:  [id number]

## 2010-07-10 NOTE — Discharge Summary (Signed)
NAMECYNITHA, BERTE              ACCOUNT NO.:  [id number]   MEDICAL RECORD NO.:  [id number]          PATIENT TYPE:  INP   LOCATION:  3713                         FACILITY:  [facility]   PHYSICIAN:  Duncan Dull, M.D.     DATE OF BIRTH:  [birth year]   DATE OF ADMISSION:  07/14/2006  DATE OF DISCHARGE:  07/21/2006                               DISCHARGE SUMMARY   .   DISCHARGE DIAGNOSES:  1. Blood loss anemia secondary to excessive bleeding from surgical      site.  2. Status post ventral hernia repair on June 12, 2006, with history      of near-strangulation secondary to multiple abdominal surgeries and      history of small bowel obstruction.  3. Coronary artery disease.  4. Atrial fibrillation, on chronic Coumadin, with an INR of 3 on      admission.  5. Hypertension.  6. Hyperlipidemia.  7. Hypothyroidism (history of Graves disease).  8. Asthma/restrictive lung disease/chronic obstructive pulmonary      disease, oxygen-dependent.  9. Gastroesophageal reflux disease.  10.Gout.  11.Anxiety/depression.  12.Nonischemic cardiomyopathy, with an ejection fraction of 40%.  13.Myocardial infarction 20 years ago.  14.Diabetes type 2.  15.History of pulmonary embolism/deep venous thrombosis, with inferior      vena cava filter.  16.Anemia secondary to abdominal wall hematoma on May 30, 2006.  17.Chronic urinary tract infections.  18.Gastric plication.  19.Endometrial hyperplasia.  20.History of macular degeneration.  21.History of urinary retention with Foley catheter.  22.Cerebrovascular accident history 10 years ago, in 1997.   DISCHARGE MEDICATIONS:  1. Lasix 20 mg p.o. daily in the morning.  2. Levothyroxine 200 millicentigrams p.o. daily.  We are going to      increase the dose of levothyroxine from 175 to 200 millicentigrams      by mouth daily.  3. Cardizem CD 60 mg p.o. daily.  4. Colchicine 0.6 mg p.o. daily.  5. Losartan 50 mg p.o. daily.  6. Singulair 10 mg p.o.  daily.  7. Multivitamins one tablet p.o. daily.  8. Macrodantin 100 mg p.o. twice a day for the next 10 days.  9. Spiriva 18 millicentigrams one puff inhaled daily.  10.Advair 250-50 one puff twice a day.  11.K-Dur 20 milliequivalents p.o. every day in the morning.  12.Lipitor 40 mg p.o. daily (can take Zocor instead).  13.Prozac 20 mg p.o. daily.  14.Nystatin 100,000 units per gram powder, apply on belly twice a day.  15.MiraLax Powder, take as instructed on package as needed for      constipation.  16.Albuterol metered dose inhaler, inhale one puff every 4 hours as      needed for shortness of breath.  17.Ocuvite, take daily as previous to admission.  18.Dilaudid 1 mg 30 minutes before packing of the wound (three times      weekly).  19.Vicodin 5-325 two tablets p.o. before wound packing.  The Vicodin      is supposed to be started after the patient finishes the Dilaudid      tablets.   CONSULTING PHYSICIANS AND SERVICES:  1. Salvatore Decent  Gross, MD, Surgery.  2. Wound care   PROCEDURES PERFORMED:  Hematoma manual evacuation and subsequent wound  vac placement.   CONDITION ON DISCHARGE:  Improved.  The wound is still draining, but the  amount of drainage has decreased.   HISTORY OF PRESENT ILLNESS:  The patient is a 77 year old white female,  recently discharged from rehabilitation after abdominal surgery to  repair a ventral hernia, who presented with increased bleeding from an  abdominal surgical wound.  She was seen on Jul 10, 2006, by Dr. Michaell Cowing,  her surgeon, who told her she had about two pints of blood in her  subcutaneous tissue that would likely drain over the next two months.  On the night prior to the emergency department visit, the wound started  to drain more blood, which was bright red.  She also complained of  fevers to 101 degrees Fahrenheit and chills, but denied any current  abdominal pain or pus draining from the wound.  She also denied any  blood from other  sites.  She had the Foley catheter discontinued about  five days prior to admission and had been urinating on her own since and  denied any urinary complaints.   PHYSICAL EXAMINATION:  VITAL SIGNS:  Temperature 98.7, blood pressure  113/60, pulse 80, respiratory rate 16, oxygen saturation 97% on two  liters.  GENERAL APPEARANCE:  The patient is in no acute distress, pleasant,  obese.  HEENT:  The pupils are equal, round and reactive to light and  accommodation.  The extraocular movements are intact.  NECK:  Obese.  RESPIRATORY:  Clear to auscultation bilaterally.  CARDIOVASCULAR:  Irregularly irregular rhythm.  GASTROINTESTINAL:  Soft, nontender (soreness in anterior abdominal fat  pad).  Erythema and edema in right and left quadrants, but in the left  more than the right.  Closed wound sites on the right side and left  side, but bleeding packed open incision hole on inferior left side of  the abdomen oozing dark red blood.  EXTREMITIES:  Edema is +1 bilaterally in the lower extremities.  GENITOURINARY:  Deferred.  SKIN:  Moist.  MENTAL STATUS EXAMINATION:  The patient is alert and oriented times  three.  NEUROLOGICAL EXAMINATION:  Nonfocal.  Muscular strength is 5-out-of-5.  The patient is moving all four extremities.  PSYCHIATRIC:  Appropriate.   LABORATORY AND DIAGNOSTIC DATA:  Sodium 133, potassium 4.3, chloride 97,  bicarbonate 35, BUN 16, creatinine 1.1, glucose 56.  The white blood  cell count is 11.3, with an ANC of 8.7.  Hemoglobin was 9.1.  Thrombocytes were 572.  The mean corpuscular volume was 87.6.  RDW was  17.7.  PT was 33.3.  INR was 3.  Arterial blood gases showed a pH of  7.38, with a pCO2 of 56.4, oxygen 93.8, bicarbonate 32.5 and oxygen  saturation 97.4%.  Total bilirubin was 0.7, alkaline phosphatase 145,  AST 28, ALT 31, protein 6.2, albumin 2.4 and calcium 8.9.  The urinalysis revealed the urine to be yellow, cloudy, 1.016 density,  nitrite-positive,  leukocytes small, 3 to 6, and bacteria many.  TSH was  13.6.  CK was 15, CK-MB 0.9 and troponin I 0.10 and it increased to 0.13  and decreased back to 0.10 after admission.   HOSPITAL COURSE:  1. Bleeding from hernia surgery site:  At admission, the patient's INR      was 3.0 from chronic coumadin use.  She was given FFP and Vitamin K      to  reverse her INR to 1.3, at which time her bleeding stopped.  At      admission, we requested a surgical consultation.  Dr. Carolynne Edouard saw the      patient in the emergency room, and he packed her wound and      recommended continuing the intravenous antibiotics.  Repeat      evaluation by her surgeon, Dr. Michaell Cowing, initially discontinued wound      packing due to concerns for cellulits and wound infection, but      drained the hematoma manually at the bedside, packed it and placed      a wound VAC.  The patient also had a wound care consultation, who      recommended pulse lavages three times a week, and they continued to      clean the wound.  The patient was stable from the medical and      surgical point of view at discharge.  2. Diabetes mellitus:  The patient had had recent hypoglycemic      episodes, including the day of admission.  We held her Amaryl and      her glucose has been controlled under 200 in the hospital.  We used      sliding scale for better control.  Her hemoglobin A1c in January      2008 was 6.8.  It is possible that the patient does not need to be      on hypoglycemics at home, but we are taking her off the Amaryl and      will let her primary care physician adjust the diabetic regimen.  3. Hypothyroidism:  We checked the TSH, which was a little increased.      We will change the levothyroxine dose of 175 to 200 mcg per day.  4. Gout:  This is not active per the patient, however, she needs to      continue to take her Colchicine to prevent further attacks, per the      patient.  5. Gastroesophageal reflux disease:  The patient was  on Protonix here.  6. Urinary tract infection:  The patient's urine cultured grew      Escherichia coli, resistant to ampicillin, cefazolin,      ciprofloxacin, levofloxacin and Bactrim, but was sensitive to      nitrofurantoin and to ceftriaxone, so we started her on Rocephin      intravenously for four days and Macrodantin that she will need to      continue at home.  She will be discharged on Macrodantin.  7. Diarrhea:  The patient had an episode of loose stool that was sent      for Hemoccult and Clostridium difficile and both were negative.  8. Chronic obstructive pulmonary disease:  We had the patient on      nebulizer treatments and she was on oxygen, as well, and she is on      oxygen at home.  9. Mild pulmonary edema. Pt experienced a mild pulm. edema per CXR and      we treated her with Lasix.   DISCHARGE DISPOSITION:  The patient's disposition was problematic secondary to her increased needs at home and her son who works during  the day The patient was initially very reticent to go to a skilled  nursing facility, and this is documented in the chart.  However, toward  the end of her stay, she thought that she might think about it and the  reason  she was adamant about not going to a facility like this was the  cost.  Since she is medically stable and surgically stable, we are not  sure if the patient will be billed for the last night in the hospital,  so the patient's sons decided to take her home tonight.  The patient has  a pink sheet, discharge summary and the prescriptions to go to a skilled  nursing facility, and this can be decided at the appointment in clinic  on Jul 24, 2006.   PHYSICAL EXAMINATION ON DISCHARGE:  VITAL SIGNS:  Temperature 98.1,  pulse 78, respirations 20, systolic blood pressure 170, diastolic blood  pressure 76.  GENERAL APPEARANCE:  The patient is on two liters of nasal cannula  oxygen.   DISCHARGE LABORATORY AND DIAGNOSTIC DATA:  The CBG is 170.   The white  blood cell count is 7.1, hemoglobin 9.3 and hematocrit 28.4.  Thrombocytes are 447.  Sodium is 139, potassium 3.9, chloride 97,  bicarbonate 35, BUN 9, creatinine 0.62 and glucose 119.  Blood cultures  times two are negative.  The urine culture  showed Escherichia coli.  There was another on Jul 15, 2006, that was  negative.  The patient was on Zosyn at that time.  Calcium was 9.1,  albumin 2.4 and phosphorus 3.7.  The patient might benefit from another  urinalysis and a urine culture at the time of the next clinic  appointment.      Carlus Pavlov, M.D.  Electronically Signed      Duncan Dull, M.D.  Electronically Signed    CG/MEDQ  D:  07/21/2006  T:  07/21/2006  Job:  [id number]   cc:   Vanetta Mulders, MD  Ardeth Sportsman, MD

## 2010-07-10 NOTE — Discharge Summary (Signed)
NAMEANTINETTE, KEOUGH              ACCOUNT NO.:  [id number]   MEDICAL RECORD NO.:  [id number]          PATIENT TYPE:  INP   LOCATION:  4734                         FACILITY:  [facility]   PHYSICIAN:  Mariea Stable, MD   DATE OF BIRTH:  1932-08-01   DATE OF ADMISSION:  01/22/2008  DATE OF DISCHARGE:  01/28/2008                               DISCHARGE SUMMARY   DIAGNOSES AT THE TIME OF DISCHARGE:  1. Abdominal pain, likely secondary to presbyesophagus and gastric      dysmotility  2. Atrial fibrillation.  3. Recurrent pyelonephritis.  4. Asthma.  5. Gout.  6. Hypothyroid.  7. Hyperlipidemia.  8. Diabetes mellitus.  9. Depression.  10.Morbid obesity.  11.Stress incontinence.  12.Constipation.  13.Prior cardiovascular accident.  14.Congestive heart failure with diastolic origin, last ejection      fraction of 55%.  15.History of gastric bypass.  16.Gastroesophageal reflux disease.  17.Ventral hernia status post repair in April 2008.  18.Cellulitis of abdominal wall with methicillin-resistant      Staphylococcus aureus in the past.  19.History of deep venous thrombosis, pulmonary embolism in 1998.  20.Coronary artery disease with myocardial infraction 20 years ago.  21.Bilateral pneumonia in 2009, cultured Legionella and Streptococcus      pneumoniae.   HOSPITAL COURSE:  1. Abdominal pain.  The patient had epigastric pain not elicited by      anything except position, no changes on admission.  EKG was      negative for changes and serial troponins showed no significant      change.  Cardiology was consulted.  The patient was initially      started on Protonix, was guaiac negative and amylase and lipase      negative.  Cardiology saw the patient and stated that the troponin      levels of this patient given her current medical condition was      insignificant, there were no significant changes on EKG, and that      the patient to be followed up as an outpatient by her  cardiologist.      They concurred with Dr. Wyonia Hough who recommended a GI workup with      EGD.  Liver enzymes later along with total bilirubin that revealed      no significant abnormalities.  GI declined to EGD of the patient      due to her lower respiratory function stating that it made her a      poor EGD candidate due to necessary sedation.  A barium swallow was      performed on the patient then, to look for ulcers as a cause of her      epigastric pain revealed a small hiatal hernia, presbyesophagus and      gastric dysmotility.  The patient was started on Reglan.  The      patient during the hospital course again did claim that her      epigastric pain was exacerbated by food.  The patient tolerated      Reglan well, since her abdominal pain was seemed to be non-emergent  we decided that it would be best for her to follow up as an      outpatient with her primary Terrick Allred for further workup.  2. Atrial fibrillation.  EKG on admission showed no new abnormalities      compared to a previous EKG.  During the hospital course, the      patient became bradycardic and we stopped her diltiazem on hospital      day 3.  She remained rate controlled off the diltiazem and was      discharged without it.  Her Coumadin therapy during her hospital      stay was managed by Pharmacy.  The patient was scheduled to follow      with Dr. Michaell Cowing on discharge for management of her Coumadin.  The      patient has the history of paroxysmal atrial fibrillation.  3. Pyelonephritis.  The patient was asymptomatic except for CVA      tenderness during her stay, which resolved on January 28, 2008.      The patient was treated with 7 days of Rocephin 2 g q.24.  White      blood cell count was never above 8.5.  The patient remained      afebrile.  Urine cultures revealed E. coli sensitive to Rocephin.      Nothing else was given.  No medications on discharge.  4. Hyperlipidemia.  The patient has a history of  hyperlipidemia and      was being treated with Crestor.  Lipid panel revealed an HDL of 33,      so omega 3 vitamin supplements were started to help increase her      HDL.  5. Diabetes mellitus.  A hemoglobin A1c done here showed a result of      7.2.  No changes were made to patient's outpatient regimen.  6. Hypothyroidism.  TSH done here is 5.657, free T4 was 1.25.  No      changes to the patient's outpatient regimen.   DISCHARGE MEDICATIONS:  The patient was discharged on;  1. Synthroid 175 mcg p.o. daily.  2. Benadryl 75 mg p.o. at bedtime p.r.n. for sleep and 6 hours p.r.n.      for itching.  3. Singulair 10 mg p.o. daily.  4. Prozac 20 mg 2 tabs p.o. daily.  5. Advair 250/50 mg 2 puffs daily.  6. Spiriva 18 mcg caplets daily.  7. Cozaar 50 mg p.o. daily.  8. Coumadin per Dr. Michaell Cowing.  9. Lasix 20 mg p.o. daily.  10.Lipitor 40 mg p.o. daily.  11.Protonix 40 mg p.o. daily.  12.Detrol LA 2 mg p.o. daily.  13.Albuterol inhaler 1-2 puffs q.4 p.r.n.  14.Omeprazole 20 mg p.o. daily.  15.Reglan 5 mg 1 tablet before breakfast, lunch and dinner at bedtime.  16.MiraLax 17 grams with water for constipation.  17.Glycerin suppository for constipation daily.   The patient's colchicine and Cardizem were discontinued.   DISCHARGE CONDITION:  The patient is stable on discharge.   DISPOSITION:  Discharge to home.   DISCHARGE LABORATORY DATA:  Blood cultures x2 negative.  PT 27.8, INR  2.4.  Urine culture, E. Coli that is Rocephin sensitive.  White blood  cell count 7.5, hemoglobin 13.4, hematocrit 41.1, platelets 237, sodium  138, potassium 4.3, chloride 95, bicarbonate 37, BUN 14, creatinine 0.8,  and glucose 129.  Cardiology consult by Dr. Allyson Sabal.   CHIEF COMPLAINT:  I have been having a lot of pain in the epigastric  area.   HISTORY OF PRESENT ILLNESS:  A 77 year old with past medical history of  coronary artery disease, atrial fibrillation, hypothyroidism, diabetes  mellitus, CHF,  and use of home O2 for obstructive lung disease and other  medical condition, presents with epigastric pain and shortness of breath  of about 1 week duration.  She wakes up each night while trying to sleep  flat due to epigastric pain then moves towards chair to sleep upright  which relieves her pain.  The patient had been using only 2 liters of O2  at home for her shortness of breath, had to increase it to 3 liters.  Her appetite is good except for when she is in pain.  Her pain, she says  is not associated with food, denies emesis, denies fevers and chills.  She says her pain does not radiate.  The patient recently had a change  in her medication regimen for control of her GERD, she was recently  changed from one PPI to another prior to the onset of symptoms.   Significant past surgical history includes gastric bypass, and  cholecystectomy.  The patient denies urinary symptoms, stating that, I  do not feel anything down there.   PHYSICAL EXAMINATION:  ADMIT VITAL SIGNS:  Temperature 97, blood  pressure 180/90, pulse 70, respiratory rate 22, and O2 sat 96% on 2  liters nasal cannula.  GENERAL:  The patient is obese.  HEENT:  Extraocular movements intact.  Pupils equal, round and reactive  to light.  Slightly red and icteric.  The patient states that this is  from sleep deprivation from the lack of sleep recently.  She has a dry  tongue, midline nasal septum.  NECK:  Supple.  No thyromegaly.  No lymphadenopathy.  RESPIRATORY:  Rales are present mainly with fine crackles at the bases.  CARDIOVASCULAR:  Regular rate and rhythm.  No murmurs, rubs or gallops.  A 2+ posterior tibial and dorsalis pedis pulses in lower extremities.  No carotid bruits bilaterally.  GI:  Soft, nontender.  ABDOMEN:  No evidence of ascites.  Bowel sounds distant due to obesity.  EXTREMITIES:  No edema.  A 5/5 strength on plantar and dorsiflexion.  A  5/5 bilateral grip strength in upper extremities.  GU:  CVA  tenderness on the right.  GENITALIA:  Deferred.  SKIN:  No jaundice.  No skin break down.  Abdominal wall show numerous  healed scars from past surgery.  NEUROLOGIC:  The patient is alert, and oriented x3.  Cranial nerves II  through XII are grossly intact.  The patient is non focal.   ADMISSION LABORATORY DATA:  Sodium is 136, potassium 4.2, chloride 96,  bicarb 33, BUN 12, creatinine 0.68, and glucose 148.  White blood cell  count 7, hematocrit 44.9, hemoglobin 14.3, and platelets 241.  Absolute  neutrophil count 4.7, MCV 85, calcium 9.8, BNP 192, PT 27.9, INR 4.0,  PTT 40, troponin less than 0.05, CK-MB 1.2, and myoglobin 102.  Chest x-  ray showed stable cardiomegaly with low lung volume, atelectasis,  vascular crowding, vascular congestion without pulmonary edema.  UA was  turbid with pus, blood, 100 protein, positive for nitrates, positive for  leukocyte.  Urinary microscopy showed 3-6 white blood cells, 0-2 RBCs  and granular casts.  No labs were pending on the time of discharge.      Mariea Stable, MD  Electronically Signed     MA/MEDQ  D:  02/04/2008  T:  02/05/2008  Job:  [id number] 

## 2010-07-11 ENCOUNTER — Ambulatory Visit (HOSPITAL_BASED_OUTPATIENT_CLINIC_OR_DEPARTMENT_OTHER): Payer: Medicare Other | Attending: Internal Medicine

## 2010-07-11 DIAGNOSIS — Z79899 Other long term (current) drug therapy: Secondary | ICD-10-CM | POA: Insufficient documentation

## 2010-07-11 DIAGNOSIS — G4733 Obstructive sleep apnea (adult) (pediatric): Secondary | ICD-10-CM | POA: Insufficient documentation

## 2010-07-13 NOTE — Cardiovascular Report (Signed)
[facility]  Patient:    [name], [name]                       MRN: [id number] Proc. Date: 09/02/00 Adm. Date:  [id number] Attending:  Darlin Priestly                        Cardiac Catheterization  INDICATIONS:  Ms. Latouche is a 77 year old female patient of Gaspar Garbe B. Little, M.D. with a history of obesity and history of abnormal two-dimensional echocardiogram revealing a questionable calcified vegetation on the posterior mitral valve leaflet.  The patient is now referred for a transesophageal echo to evaluate her mitral valve.  After giving informed written consent, the patient is brought to the endoscopy suite in a fasting state.  She was given 75 mg of Demerol and 2 mg of Versed. After adequate anesthesia, the patient underwent a successful uncomplicated echo.  This revealed mild left ventricular hypertrophy with low normal EF at 55 to 60%.  There are no focal wall motion abnormalities visualized.  Her gastric views of her left ventricle were suboptimal.  The aortic valve appeared to be mildly calcified with focal calcification of the left coronary cusp.  By preliminary, there was an aortic valve area of approximately 1.8 sqcm.  There was mild aortic regurgitation.  There was moderate mitral annular calcification, the majority of which appeared to be posterior with mild thickening of the anterior mitral valve leaflet and moderate thickening of the posterior mitral valve leaflet.  There was trivial mitral regurgitation.  There is no evidence of significant mitral stenosis.  The calcification seen around the posterior aspect of the mitral valve appeared to extend into the left atrium.  I could not exclude the possibility of an old healed vegetation, however, there is no evidence of active endocarditis noted.  Tricuspid valve appears to be structurally normal with trivial tricuspid regurgitation.  The patient was then given agitated saline  with positive right to left shunting noted by contrast echo.  There was mild atherosclerosis of the transverse aortic arch.  The patient tolerated the procedure well and was transferred to the recovery room in stable condition. DD:  09/02/00 TD:  09/02/00 Job: [id number]

## 2010-07-13 NOTE — Op Note (Signed)
[name], [name]              ACCOUNT NO.:  [id number]   MEDICAL RECORD NO.:  [id number]          PATIENT TYPE:  INP   LOCATION:  2550                         FACILITY:  [facility]   PHYSICIAN:  Ardeth Sportsman, MD     DATE OF BIRTH:  10/01/1932   DATE OF PROCEDURE:  06/12/2006  DATE OF DISCHARGE:                               OPERATIVE REPORT   SURGEON:  Ardeth Sportsman, MD.   ASSISTANT:  Currie Paris, MD.   PREOPERATIVE DIAGNOSIS:  Ventral incisional herniae with small bowel and  colonic incarceration.   POSTOPERATIVE DIAGNOSIS:  Ventral hernia x 13; total region, 15 x 20 cm  in size.   PROCEDURES PERFORMED:  1. Lysis of adhesions times 120 minutes.  2. Laparoscopic ventral herniae repair with Parietex mesh/Seprafilm      mesh (20 x 25 and 10 x 20 sizes mesh sewn together = 30x25 cm      total).   SPECIMENS:  None.   DRAINS:  None.   ESTIMATED BLOOD LOSS:  Less than 50 mL.   COMPLICATIONS:  None.   INDICATIONS:  [name] is a 77 year old morbidly obese female with  numerous health issues, infraclavicular atrial fibrillation, on chronic  anticoagulation, and CPD and restrictive lung disease and other issues,  who has had prior abdominal surgeries, including gastric plication done.  She developed an abdominal wall herniation and actually was admitted  earlier this year with an episode of bowel obstruction.  She was sent my  way for a surgical intervention, given the fact that, despite her major  health issues, she did have a near-strangulation episode.   The anatomy and physiology of abdominal wall formation and  pathophysiology of incisional herniation were explained.  Operating  suite were discussed and recommendation was made for laparoscopic lysis  of adhesions, with possible conversion to open, and ventral hernia  repair with mesh.  Risks such as stroke, heart attack, deep vein  thrombosis, pulmonary embolism and death were discussed.  Risks such as  bleeding, need for transfusion, wound infection, abscess, injury to  other organs, intracutaneous fistula, hernia recurrence, prolonged  significant amount of pain and numerous other risks were discussed in  detail.  Questions were answered and she agreed to proceed.   OPERATIVE FINDINGS:  Again, she had 13 total incisional herniae in a  Swiss cheese pattern.  Some are rather large in size, but the total  region was about 15 x 20 cm in size.  She had transverse colon  incarcerated in one of the larger areas, and a loop of small bowel  incarcerated through the second largest region.  There was no definite  evidence of any ischemia or necrosis.   DESCRIPTION OF PROCEDURE PERFORMED:  Consent was confirmed.  The patient  received IV antibiotics without difficulty.  She underwent general  anesthesia without difficulty.  She was positioned supine with both arms  tucked.  She had a Foley catheter placed.  Her abdomen was prepped and  draped in a sterile fashion.   Entry was gained in the abdomen through a 5-mm port in the  right upper  quadrant with the patient in reverse Trendelenburg and right side up,  using optical entry technique.  A capnoperitoneum to 15 mmHg provided  good abdominal insufflation.  There was a large amount of omental, small  bowel and colonic adhesions in the midline, and also slightly left of  midline as well.  Under direct visualization, a 5-mm port was placed in  the right midabdomen and right lower quadrant.  Later in the case, one  was placed in the left upper quadrant and the left lower quadrant, and  also one in the midline.   Sharp dissection was used through lysis of adhesions.  There was no  cautery or Harmonic scalpel used to avoid the risk of any ischemic or  cautery or heat injury.  Using this over 2 hours, we ultimately were  able to free the omentum, a loop of midjejunum and her transverse colon  close to her splenic flexure was able to be freed and  mobilized out.  This also helped free some of the hernia sac out as well.  A total of 13  holes was detected in the region, as noted above.  There was no evidence  of any definite strangulation or ischemia of the intestine or colon.  The region was measured out.  The mesh was chosen, as noted above.  The  right upper quadrant was dilated up to a 10-mm port, and I was able to  pass it through that hole without difficulty, just through the wound,  and not through the port insufficiency.  The mesh was unrolled.  The  mesh was secured to the anterior abdominal wall using interrupted  fascial stitches that had been pretied on the rough side of the mesh.  They were alternating Prolene and Ethibond #1 stitches, each about 5 cm  away from each other in a circumferential pattern, as well as one in the  center as well.  This helped to provide excellent circumferential  coverage around it, which was, at the least, 5-10 cm circumferential  coverage around the edges of the hernia defects.  The meshes were  secured at the edges, as well as centrally using a tacker to help have  the mesh approximate well.  Please note that the placement of the mesh  was done with a decreased capnoperitoneum to around 8 to 12 mm,  depending on ability of visualization.  The fascial stitches were tied  from the skin side.  Copious irrigation was done.  There was nice clear  return.  There was no evidence of leak of bile or blood.  The  capnoperitoneum was evacuated.  Ports were removed.  The right upper  quadrant 10-mm port was actually hidden within the mesh itself, and so  there were no significant fascial defects.  The larger port sites were  closed using a 4-0 Monocryl stitch.  Smaller incisions were closed using  a Dermabond stitch.  The patient was extubated and sent to the recovery  room in stable condition.  I did discuss the case with the patient's son.  Questions were answered.  He expressed understanding and  appreciation.      Ardeth Sportsman, MD  Electronically Signed     SCG/MEDQ  D:  06/12/2006  T:  06/12/2006  Job:  [id number]   cc:   Rufina Falco, M.D.  Currie Paris, M.D.

## 2010-07-13 NOTE — Discharge Summary (Signed)
. [facility]  Patient:    [name], [name]                       MRN: [id number] Adm. Date:  06/13/00 Disc. Date: 06/17/00 Attending:  Julieanne Manson, M.D. CC:         Eulas Post, Ph.D.                           Discharge Summary  ADMISSION DIAGNOSES: 1. Shortness of breath. 2. Chest pain.  DISCHARGE DIAGNOSES:  1. Exacerbation of chronic obstructive pulmonary disease with asthmatic     bronchitis.  2. Chest pain secondary to exacerbation of chronic obstructive pulmonary     disease with asthmatic bronchitis.  3. Paroxysmal atrial fibrillation secondary to exacerbation of chronic     obstructive pulmonary disease with asthmatic bronchitis.  4. Morbid obesity.  5. History of hypertension.  6. History of deep venous thrombosis and pulmonary emboli with Greenfield     filter.  7. Status post remote cerebrovascular accident.  8. Status post gastric bypass surgery.  9. History of gout. 10. Hypothyroidism.  HISTORY AND PHYSICAL EXAMINATION:  See note on chart, but briefly, this 77 year old, morbidly obese female who had a history of multiple pulmonary emboli in 1998 and a Greenfield filter placed at that time, presents with two weeks of progressive shortness of breath and chest congestion.  In addition to this, she had left-sided chest pain that occurred after coughing.  She presented to the emergency room with significant breathlessness and chest discomfort and was noted to be in atrial fibrillation.  HOSPITAL COURSE:  The patient had a distant history of atrial fibrillation that had been well controlled and she was on aspirin therapy alone.  With the exacerbation of her COPD, she presented in atrial fibrillation.  This converted spontaneously with just her Cardizem.  She had been maintained on aspirin because of her morbid obesity and high risk of falling.  She is not being discharged on Coumadin for the same reason, although I had a  long discussion with the patient about the possibility of her going on Coumadin should she have recurrent atrial fibrillation.  I feel that this episode was exacerbated by her worsening COPD.  From a chest pain standpoint, her first cardiac enzymes were troponin 0.31. The remainder of the cardiac enzymes were unremarkable.  The EKG showed no acute changes.  She had no recurrent pain.  She had had a negative nuclear study in August of 2000 that showed a 54% ejection fraction and it was not felt that additional cardiac work-up was indicated at this time.  From a COPD standpoint, she was placed on Zithromax, multiple inhalers and nebulizers, including inhaled steroids.  She was seen in consultation by pulmonary, who agreed and felt that this was an exacerbation of her COPD.  She had a gradual improvement in her breathlessness, a reduction in her coughing, and she remained afebrile.  At the time of discharge, she is ambulating in the halls and her oxygen saturations stay greater than 90%.  She is on room air with an O2 saturation of 94%, not short of breath, and not wheezing.  Her chest pain is completely resolved and she is in a sinus rhythm.  DISCHARGE MEDICATIONS:  1. Aspirin once a day.  2. Synthroid 0.175 mg once a day.  3. Catapres 0.1 mg b.i.d.  4. Paxil 20 mg once a day.  5. Hyzaar 10/25 mg one a day.  6. Cardizem 180 mg once a day.  7. Allopurinol 300 mg once a day.  8. Lescol 80 mg once a day.  9. Serevent one puff b.i.d. 10. Ditropan 5 mg one b.i.d. 11. Flomax two puffs b.i.d. 12. Atrovent inhaler to be used on a p.r.n. basis.  DIET:  She was instructed to stay on a low-salt, low-fat diet.  FOLLOW-UP:  She is to be seen in my office in two weeks and will follow up with her regular medical doctor, E. Miguel Aschoff, Ph.D.  DISCHARGE STATUS:  Improved without significant breathlessness.  She is instructed to use her cane for ambulation and to contact my office if she  has recurrent problems with arrhythmias or chest pain. DD:  06/17/00 TD:  06/17/00 Job: [id number]

## 2010-07-13 NOTE — Discharge Summary (Signed)
[name], [name]              ACCOUNT NO.:  [id number]   MEDICAL RECORD NO.:  [id number]          PATIENT TYPE:  INP   LOCATION:  3740                         FACILITY:  [facility]   PHYSICIAN:  Ardeth Sportsman, MD     DATE OF BIRTH:  04/20/1932   DATE OF ADMISSION:  06/12/2006  DATE OF DISCHARGE:  06/25/2006                               DISCHARGE SUMMARY   DIAGNOSES:  1. Ventral hernia repair with episode of incarceration x2 status post      elective repair.  2. Postoperative hematoma secondary to anticoagulation.  3. Diabetes type 2.  4. Restrictive lung disease.  5. History of pulmonary embolism and deep venous thrombosis on chronic      anticoagulation status post inferior vena cava filter placement in      the past.  6. History of urinary incontinence.  7. Hypertension.  8. Congestive heart failure.  9. Hypothyroidism.  10.History of atrial fibrillation in the past.  11.Anxiety.  12.Depression.  13.Gout.  14.Gastroesophageal reflux disease.  15.Morbid obesity.  16.Ischemic cardiomyopathy with an ejection fraction around 40%.  17.Hyperlipidemia.  18.Coronary artery disease.   PROCEDURE PERFORMED:  Laparoscopic lysis of adhesions and ventricle  hernia repair x13 with a total of 30 x 25 cm Parietex/Seprafilm mesh on  June 12, 2006.   SUMMARY OF HOSPITAL COURSE:  [name] is a morbidly obese female  with numerous medical issues.  Unfortunately had an episode of  strangulation and bowel obstruction with a ventral hernia.  She was  cleared by cardiology and felt the risks of re-incarceration were to  high to ignore.  The options were discussed.  Recommendations were made  for surgical repair.  She went to this on June 12, 2006, rather  uneventfully.  Initially the first 2 days she was placed just on low  dose anticoagulation.  She was monitored closely and the medicine  service helped follow her with me.  She was starting to have some p.o.  tolerance and was  transferred from step-down to the floor on  postoperative day 3.  We had her placed on CPAP at night.  Her  hemoglobin was followed and was stable the first few days and she was  just placed on low dose Lovenox.  She was switched to full  anticoagulation on postoperative day #4.  She was not getting up very  well despite maximal assistance and had issues with sedation and changes  in mental status.   She was hemodynamically stable throughout this point.  However, she  dropped her blood pressure, her hemoglobin dropped to 7.  She was  switched over to the intensive care unit, had aggressive transfusion and  her anticoagulation was held.  There was some question of pneumothorax  in the left chest, but it turned out to be false on later followup  films.  She developed renal failure with a creatinine up to 1.9.  After  aggressive hydration and transfusion, she ultimately stabilized.  She  was transferred back to the floor and her anticoagulation was held for  some time.  Her hemoglobin was stable between  9 and 10.  She had some  leukocytosis, but this normalized.  She was placed on broad spectrum  antibiotics and pan cultured, but nothing actually really grew.   By the time of discharge, she was tolerating some oral intake of over 1  liter.  She was on nasal canula doing relatively well.  Rehab  consultation was made and was thought she could benefit from  rehabilitation.  Her creatinine and urine function normalized.  Based on  these previous issues, she was transferred to rehabilitative service on  June 25, 2006 with the following instructions.   1. She should followup and see me in about 2 weeks.  2. She should advance her diet as tolerated.  3. She should work with physical therapy and occupational therapy as      tolerated.  4. I think she should hold her anticoagulation for another week or so      before re-attempting anticoagulation given her history of recent      significant  bleeding.  5. I will try and follow her hematoma expectantly and try to avoid any      drainage and that would increase the risk of infection of mesh an      other problems.  6. She should continue on her medication regimen, which is noted in my      office note and noticed on the history and physical of the rehab      service on the same date, which includes Hyzaar XS,      hydrochlorothiazide, Singulair, Cardizem, Tylenol, colchicine,      Proventil, Synthroid, fluoxetine, Benadryl, and perhaps starting      Coumadin depending on how she does.      Ardeth Sportsman, MD  Electronically Signed     SCG/MEDQ  D:  07/29/2006  T:  07/29/2006  Job:  [id number]   cc:   Rufina Falco, M.D.  Gary Fleet, M.D.

## 2010-07-13 NOTE — Consult Note (Signed)
[name], [name]              ACCOUNT NO.:  [id number]   MEDICAL RECORD NO.:  [id number]          PATIENT TYPE:  INP   LOCATION:  2016                         FACILITY:  [facility]   PHYSICIAN:  Leonie Man, M.D.   DATE OF BIRTH:  11/13/1932   DATE OF CONSULTATION:  03/25/2006  DATE OF DISCHARGE:                                 CONSULTATION   CONSULTING SURGEON:  Leonie Man, M.D.   PRIMARY CARE PHYSICIAN:  Dr. Aundria Rud.   ADMITTING PHYSICIAN:  Medical teaching service B.   REASON FOR CONSULTATION:  Ventral hernia with recent pain, nausea and  vomiting.   HISTORY OF PRESENT ILLNESS:  [name] is a 77 year old female  patient who is a retired Charity fundraiser, well known to Dr. Lurene Shadow. She was admitted  on March 24, 2006, with complaints of abdominal pain and nausea and  vomiting. At that time she was tender over her supraumbilical paramedian  hernia site.  She had associated bulging at that time. The patient was  able to eventually self reduce this area.  She states that at home when  this area bulges, she is able to self reduce without problems. After  this bulge was resolved, the patient had decreased pain and decreased  nausea. Since admission, the patient's diet had only been clear liquids.  On the day of admission, CT of the abdomen and pelvis was done, and this  demonstrated a large loop of bowel into the hernia area extending out  into the left abdomen.  This area did not appear to be incarcerated,  and, as noted, since that time, the patient has self reduced bowel back  into the abdominal cavity. Because of the patient's symptoms, a surgical  evaluation has been requested.   REVIEW OF SYSTEMS:  As above.  The patient complains of problems with  intermittent constipation which is chronic, usually related to when she  has bulging in the hernia area.  She states this improves after taking a  laxative agent. She also complains of occasional bulging in the same  hernia site  after meals at home, especially larger meals. Again, this  spontaneously resolves or the patient turns herself on her side to  assist in self reduction of the hernia.   PAST MEDICAL HISTORY:  1. Diabetes with neuropathy.  2. Morbid obesity,  3. Hypertension.  4. Nonischemic cardiomyopathy, EF 40-50%.  5. History of CHF exacerbation.  6 . Atrial fibrillation on chronic Coumadin anticoagulation.  1. Asthma, possible COPD and history of pulmonary alveolitis.  2. Remote MI greater than 20 years ago.  3. Pulmonary embolus status post IVC filter as well as on Coumadin.  4. 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  5. Osteoarthritis.  6. Hypothyroidism.  7. Gout.  8. Depression.  9. GERD.  10.Macular degeneration.  11.Stress urinary incontinence.   PAST SURGICAL HISTORY:  1. Prior exploratory laparotomy for cholecystectomy and gastric      plication many years ago.  2. Breast biopsy.   SOCIAL HISTORY:  She is a retired Astronomer. Remote tobacco.  No alcohol.  She  is widowed.  She lives with her son.   ALLERGIES:  No known drug allergies.   CURRENT MEDICATIONS:  Amaryl, Ditropan, Lipitor, Synthroid, Protonix,  Advair, Spiriva,  p.r.n. potassium, Ambien, colchicine, Singulair,  Prozac, diltiazem, albuterol, Phenergan, and Percocet.   PHYSICAL EXAMINATION:  GENERAL:  Pleasant female patient relating  improved abdominal pain as compared to admission.  VITAL SIGNS:  Temperature 98.2, blood pressure 130/70, pulse 82 and  regular, respirations 16.  NEUROLOGIC: The patient is alert and oriented x3, moving all extremities  x4 without focal deficits.  HEENT:  Head normocephalic. Sclera not injected.  NECK:  Supple without appreciable adenopathy.  CHEST:  Bilateral lung sounds clear to auscultation.  Decreased in the  bases and occasional faint expiratory wheeze. She is on O2 at 4 liters  per minute and saturating 97%.  HEART:  Heart sounds S1-S2 without rubs, murmurs, thrills, or  gallops.  She is slightly irregular, demonstrating atrial fibrillation on  telemetry.  ABDOMEN:  Obese.  She is a midline scar that also involves the  umbilicus, vertical. There is evidence of wall defect above and around  the umbilical area. Bowel peristalsis is felt below the supraumbilical  wall defect. She is not tender in this area, and there is no bulging or  herniation noted.  She has bowel sounds present.  There are no  cellulitic skin changes to this area as well.  EXTREMITIES:  Show lower extremity edema, nonpitting. Pulses palpable.  PICC line in the right upper extremity.   LABORATORY DATA:  White count 5500, hemoglobin 12.1, platelets 298,000.  Sodium 140, potassium 3.4, CO2 33, BUN 9, creatinine 0.77, glucose 135.  INR of 2.3.   DIAGNOSTICS:  CT of the abdomen and pelvis reveals a ventral hernia with  bowel extending through the hernia, up into the left abdominal wall, not  incarcerated. This was done on January 28, and after this, patient has  self reduced this same hernia.   Chest x-ray showed improvement and perihilar opacities with some  interstitial lung disease noted.   IMPRESSION:  1. Ventral hernia with recent abdominal pain, nausea, vomiting.  2. Atrial fibrillation.  3  History of pulmonary embolus  1. Chronic Coumadin anticoagulation  2. History of lung disease, asthma, and alveolitis, possible chronic      obstructive pulmonary disease.  3. Nonischemic cardiomyopathy, ejection fraction 40%.   PLAN:  1. At this point, need to advance the patient's diet to see how she      was will tolerate. At home, she has been having trouble with some      discomfort and bulging after eating. As she does not tolerate and      has these symptoms of nausea and vomiting, may need to proceed with      ventral hernia repair this admission.  Otherwise, consider tuning      patient up on this admission and have her follow up with Dr. Lurene Shadow     in the office for evaluation  for elective repair of the hernia.  2. If the patient does go to OR, whether it be this admission are in      the future, she will need to be off Coumadin, recommending INR be      less than 1.8 preoperatively.  The patient may also need pulmonary      evaluation preoperatively as well.  I will defer management at this      point to Dr. Lurene Shadow pending his examination of the patient and  review of the chart and CT scans.      Allison L. Rennis Harding, N.P.      Leonie Man, M.D.  Electronically Signed    ALE/MEDQ  D:  03/25/2006  T:  03/25/2006  Job:  [id number]

## 2010-07-13 NOTE — Cardiovascular Report (Signed)
[name], [name]              ACCOUNT NO.:  [id number]   MEDICAL RECORD NO.:  [id number]            PATIENT TYPE:   LOCATION:                                 FACILITY:   PHYSICIAN:  Madaline Savage, M.D.DATE OF BIRTH:  11/16/1932   DATE OF PROCEDURE:  12/28/2004  DATE OF DISCHARGE:                              CARDIAC CATHETERIZATION   PROCEDURES PERFORMED:  1.  Selective coronary angiography by Judkins technique.  2.  Retrograde left heart catheterization.  3.  Left ventricular angiography  4.  Abdominal aortography to evaluate renal arteries, nonselectively  5.  AngioSeal percutaneous right femoral artery closure.   COMPLICATIONS:  None.   ENTRY SITE:  Right femoral.   DYE USED:  Omnipaque.   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 Her last  Cardiolite was in July 2001 that was negative for ischemia. The patient now  presents 12/24/2004 to this hospital with shortness of breath and chest  pain. The chest pain is a tightness with cough; and there is shortness of  breath with exertion in the bathroom. The patient's given weight is 315 and  her given height is 5 feet 1 inch. The patient's baseline creatinine prior  to cath is 0.9. Her PT/INR is 1.3. Today cardiac catheterization was  completed by the right percutaneous femoral approach, using 6-French Cordis  diagnostic catheters. There were no complications.   RESULTS:  Pressures:  left ventricular pressure after IV labetalol for  elevated blood pressure was 165/27, end-diastolic pressure 26. Central  aortic pressure 165/83 with a mean of 118.   ANGIOGRAPHIC RESULTS:  1.  The patient's left main coronary artery was medium in diameter and      medium-to-long in length and very smooth and normal. The LAD gives rise      to a septal perforator branch  very near the origin of this vessel from      the left main. The LAD is a fairly small vessel showing no evidence of      any stenosis. There is one major diagonal branch arising from the LAD      after septal perforator #2 that bifurcates distally. That vessel was      normal a very small, more proximal diagonal arises from LAD near septal      perforator and shows no lesions. No lesions were seen in the entirety of      the LAD or two diagonals.  2.  The circumflex was a nondominant vessel giving rise to a pair of atrial      circumflex branches proximally; and accessory branch distally which is      hard to name; and the circumflex terminates as a posterolateral branch.      Midway down the circumflex, after the second atrial circumflex branch      and the accessory branch in the distal circumflex, there was noted to be      a 30-40%  focal stenosis in the vessel without calcification haziness      there was TIMI 3 flow in this circumflex vessel.  3.  Right coronary artery was large and dominant, trifurcating at the distal      portion of the vessel into a posterolateral, an intermediate branch, and      then a posterolateral branch. No lesions were seen in the entirety of      the dominant RCA visualized and three views.  4.  LV angiography showed normal LV contractility. No significant wall      motion abnormalities. Ejection fraction estimate of 60% and no mitral      regurgitation.   ABDOMINAL AORTOGRAPHY:  Performed just above the renal arteries showed  single renal arteries bilaterally with no renal artery stenosis and no o  evidence of abdominal aortic aneurysm. There was very very mild calcific  plaque along the aorta that was not associated with any stenosis of the  aorta.  I then assessed the patient's right femoral artery by a 45-degrees  RAO projection with a hand injection into the femoral artery to ensure that  the sheath was in a favorable location for AngioSeal  placement. I then  proceeded with Angio-Seal which was successful and minimally painful.   FINAL DIAGNOSIS:  1.  Angiographically patent coronary arteries with only a 30-40% area of      luminal irregularities in the nondominant circumflex.  2.  Normal LV systolic function without mitral regurgitation.  3.  Normal renal arteries.  4.  Systemic hypertension.   PLAN:  The patient can be reassured with respect to her coronary arteries.  It could be that her chest pain is related to her work of breathing.           ______________________________  Madaline Savage, M.D.     WHG/MEDQ  D:  12/28/2004  T:  12/29/2004  Job:  [id number]   cc:   [name], M.D.  Fax: [phone]   [name], M.D.  Fax: [phone]   [name], M.D.  Fax: [phone]

## 2010-07-13 NOTE — Discharge Summary (Signed)
[name], [name]              ACCOUNT NO.:  [id number]   MEDICAL RECORD NO.:  [id number]          PATIENT TYPE:  INP   LOCATION:  4738                         FACILITY:  [facility]   PHYSICIAN:  [name], M.D.  DATE OF BIRTH:  November 27, 1932   DATE OF ADMISSION:  05/23/2005  DATE OF DISCHARGE:  06/04/2005                                 DISCHARGE SUMMARY   DISCHARGE DIAGNOSES:  1.  Shortness of breath secondary to asthma and restrictive lung disease.      No component of chronic obstructive lung disease as well as pneumonia.  2.  Chronic atrial fibrillation, on Coumadin therapy.  3.  Hypertension.  4.  Nonischemic cardiomyopathy with systolic dysfunction, ejection fraction      of 40% to 50%.  5.  Diabetes mellitus, type 2.  6.  Osteoarthritis.  7.  Status post cerebrovascular accident in September1997 with residual mild      left hemiplegia.  8.  Endometrial hyperplasia.  9.  Hypothyroidism.  10. History of pulmonary embolus, status post Greenfield filter placement,      on chronic Coumadin therapy.  11. History of pulmonary alveolitis.  12. Morbid obesity, status post gastric plication many years ago.  13. Urinary tract infection.  14. Bradycardia while on metoprolol 25 mg b.i.d.  15. History of gout.   DISCHARGE MEDICATIONS:  1.  Prednisone 20 mg p.o. daily for 5 days.  2.  Lantus insulin.  The patient was to check her CBG twice a day and to      give herself 10 units of the Lantus at bedtime if both CBGs during the      day were more than 150; this is to be stopped once her prednisone is      stopped.  3.  Albuterol 2.5-mg nebulizers three times a day.  4.  Albuterol MDI one to two puffs q.4 h. p.r.n.  5.  Pulmicort Turbohaler four puffs b.i.d.  6.  Amaryl 2 mg p.o. daily.  7.  Oxybutynin 5 mg p.o. b.i.d.  8.  Lipitor 40 mg p.o. daily.  9.  Colchicine 0.6 mg p.o. b.i.d.  10. Diphenhydramine 25 mg p.o. nightly.  11. Cardizem 300 mg p.o. daily.  12. Synthroid 0.225  mg p.o. daily.  13. Singulair 10 mg p.o. nightly.  14. Hyzaar 100/25 mg p.o. daily.  15. Coumadin 5 mg Monday, Wednesday and Friday and 2.5 mg on Thursday.  16. Magic Mouthwash.  17. The patient is to stop using Atrovent and metoprolol.   DISCHARGE DISPOSITION:  The patient was discharged home close to her  baseline with some deconditioning.  She will have home physical therapy and  occupational therapy.  She is also to see Dr. Erline [name] at the Coumadin  clinic on April16 at 11:45 a.m. for management of her Coumadin and INR and  will see her primary physician, Dr. Clyde [name], in the outpatient clinic on  April24 at 2:00 p.m.   PROCEDURES:  High-resolution CT scan of the chest done on April3,2007 showed  interval clearing of the mosaic pulmonary pattern that was present on the  prior CT of the chest.  It also showed slight atelectasis at the bilateral  lower lobes, lingula and the lung bases.  There was no interval change in  the slight local eventration in the right hemidiaphragm and no significant  interstitial lung disease.   ADMITTING HISTORY AND PHYSICAL:  [name] is a 77 year old white woman  with the multiple medical problems as stated above, who presented with a  history a 3- to 4-day history of increased shortness of breath and increased  use of her inhalers.  She also usually uses home oxygen intermittently,  which she had increased her usage significantly over the past few days.  She  was also reporting a productive cough with yellow sputum for about 2 weeks,  as well as subjective fevers and chills.  She stated that she has a history  of asthma, chronic bronchitis and restrictive lung disease.  She denied  chest pain, but did endorse vague lower abdominal pain and intermittent  nausea for several days.  Of note, she was hospitalized in October and  November for chest pain and underwent cardiac catheterization and 2-D  echocardiogram which showed nonobstructive coronary  arteries as well as  ejection fraction of 40% to 50% and right ventricular dilation.  She is a  former smoker of only a few cigarettes a week over 20 years ago.  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  Both her parents have died from myocardial infarction.  Of note, on her  review of systems, she was also complaining of urinary frequency, no other  urinary symptoms, and lower abdominal pain.   PHYSICAL EXAMINATION:  VITAL SIGNS:  She had a temperature of 99.9, blood  pressure 129/98, heart rate 101, respiratory rate 22-24 with a saturation of  oxygen 94% on 4 L.  On general exam, she was lying in bed with a mild  increase in her work of breathing.  HEENT:  Her pupils were equal and reactive to light and accommodation and  her extraocular movements were intact.  She had dry mucous membranes with  posterior nasal drip.  NECK:  Supple with mildly enlarged thyroid.  She had no jugular vein  distention.  LUNGS:  She had increased work of breathing and was tachypneic with diffuse  rhonchi and scattered expiratory wheezes, left greater than right.  She also  had upper airway noises.  HEART:  Her heart sounds were distant and her heart rate was irregularly  irregular without murmurs, rubs or gallops.  ABDOMEN:  She had hypoactive  bowel sounds, was a morbidly obese woman with a midline scar and she had  mild diffuse tenderness without rebound or guarding.  EXTREMITIES:  She had 1+ lower extremity edema bilaterally with normal  distal pulses.  BACK:  She had bilateral CVA tenderness on the left greater than the right.  NEUROLOGICAL:  Exam was grossly normal and symmetric.   LABORATORY DATA:  WBC 10.3, hemoglobin 13.3, platelets 278, 000.  Sodium  138, potassium 3.3, chloride 90, CO2 35, BUN 16, creatinine 0.8, blood sugar  123.  Urinalysis was positive for nitrites and had small leukocyte esterase, 7-10 wbc's and 0-2 rbc's.  On microanalysis of  the urinalysis, there were 7-  10 wbc's and 0-2 rbc's.  BNP was 133.0.   A chest x-ray showed stable diffuse peribronchial thickening with slightly  improved left aeration.  There was also right basilar atelectasis with mild  cardiomegaly and no edema.   HOSPITAL COURSE:  The patient was admitted for management of her shortness  of breath and was started on Xopenex and Atrovent nebulizers as well as IV  Solu-Medrol, Singulair and ceftriaxone and azithromycin for coverage of  possible pneumonia.  An EKG and cardiac enzymes were also checked to rule  out a cardiac cause of her shortness of breath and Cardiology was consulted,  when the troponins were elevated at 0.15; however, Cardiology did not feel  that this was secondary to coronary artery disease, as her recent  catheterization was normal and showed a normal ejection fraction.  Her  metoprolol that she was taking at home was also stopped secondary to severe  bronchospasm and was changed for a calcium channel blocker for her blood  pressure control as well as rate control of her atrial fibrillation.  By her  sixth day of hospitalization, it was noted that she was still requiring 4 L  of oxygen at all times to keep his saturation over 95%.  She did speak in  full sentences and her wheezing was slightly decreased.  She was still on  Solu-Medrol at that point and was not improving as fast as expected.  A  repeat CT of his chest was done to evaluate the previously noticed mosaic  pulmonary pattern as well as the diagnosis that was in her chart of  restrictive lung disease.  The CT of the chest showed interval clearing of  the mosaic pulmonary pattern and no significant interstitial lung disease.  There was, however, slight atelectasis of the bilateral lower lobes, lingula  and lung bases and there was no interval change and slight local eventration  of the right hemidiaphragm.  On April5, her antibiotics were changed to  Avelox and this  was continued for a total of 10 days and we were finally  able to taper her steroids down.  Spirometry exercises were started with  quick improvement in her breathing status with exercise of her lungs.  Pulmonary function tests were done on April8 which showed reactive airway  disease and restrictive disease.  It was also mentioned that there was a  possibility for neuromuscular disease, but the breathing effort was  insufficient to come to such a diagnosis.  There was no component of chronic  obstructive lung disease.  After this was noted, her prednisone was finally  decreased to 20 mg p.o. daily with improved wheezing and changes in her home  regimen were made as follow:  She was to continue her albuterol 2.5-mg  nebulizers, but to increase this to 3 times a day.  She was to continue  using her albuterol MDI every 4 hours as needed.  She was to stop taking  metoprolol, stop taking Atrovent, as she does not have chronic obstructive lung disease and change her Pulmicort to a Turbohaler, to take four puffs  b.i.d.  I also advised her to wear her home oxygen more often than she used  to do, meaning at all times during exercise and during sleep, and as needed  when at rest.  Dr. Pasty Spillers was made aware of these changes and will follow  with the patient on April24.   As far as her atrial fibrillation, the patient had been having some  bradycardia when she was changed from metoprolol to diltiazem; this was  afterward changed to Cardizem titrated to 300 mg p.o. daily and her rate was  well-controlled, being at 273 on the day of discharge.  She will continue  this as is at home became.   For her diabetes, she was usually only on Amaryl 2 mg p.o. daily at home.  While in the hospital with steroids, she had been requiring a very high dose  of Lantus insulin.  On the day of discharge, her CBCs were running between  98 and 227 and this was on Lantus 35 units nightly.  She was taught how to  check  her blood sugars at home, as well as to inject herself insulin and  advised to give herself 10 units of Lantus at bedtime if her CBGs had been  more than 150 during the day and to do this only during her prednisone  therapy.  She will follow up with this with Dr. Pasty Spillers in the outpatient  clinic   The patient was also noted to have a urinary tract infection during her  hospitalization.  A urine culture showed Enterobacter cloacae as well as  Procidentia stuartii which were both sensitive to Rocephin, which she was  already receiving.  She was treated with antibiotics a total of 10 days and  was asymptomatic on this part of the time of discharge.   During the hospitalization, the patient had also become very deconditioned.  She was noted to at home be ambulating with cane and during most of her  activities by herself.  She did, however, say that she had trouble with  bathing herself and that she could not cook her meals.  It was advised that  she get a home aide at home for this and she will also get physical therapy  and occupational therapy on discharge.   DISCHARGE LABORATORY DATA AND VITALS:  Temperature 99.3, blood pressure  138/64, heart rate 73, respiratory rate 22, saturating at 98% on 3 L and 94%  on room air.   WBC 15.6, hemoglobin 14.2, platelets 336,000.  Sodium 138, potassium 3.9,  chloride 97, CO2 33, BUN 29, creatinine 0.9, glucose 94, calcium 8.5.  INR  1.3.   She was discharged home with the same dose of Coumadin, 5 mg daily, until  she was to see Dr. Juleen Starr in the Coumadin clinic, who will manage this  further.      Dellia Beckwith, M.D.      [name], M.D.  Electronically Signed    VD/MEDQ  D:  06/25/2005  T:  06/26/2005  Job:  [id number]   cc:   Clare Charon, M.D.  Fax: [phone]

## 2010-07-13 NOTE — Discharge Summary (Signed)
   [name], [name]                          ACCOUNT NO.:  [id number]   MEDICAL RECORD NO.:  [id number]                   PATIENT TYPE:  INP   LOCATION:  3741                                 FACILITY:  [facility]   PHYSICIAN:  Thereasa Solo. Little, M.D.              DATE OF BIRTH:  1932/06/25   DATE OF ADMISSION:  01/28/2002  DATE OF DISCHARGE:  02/02/2002                                 DISCHARGE SUMMARY   ADMISSION DIAGNOSES:  1. Exacerbation of chronic obstructive pulmonary disease.  2. Paroxysmal atrial fibrillation.  3. Hypertension.   DISCHARGE DIAGNOSES:  1. Exacerbation of chronic obstructive pulmonary disease, improved.  2. Hyperglycemia secondary to steroids.  3. Paroxysmal atrial fibrillation with controlled ventricular response and     therapeutic INR.  4. Hypertension.  5. Non-insulin-dependent diabetes mellitus.   PROCEDURES:  None.   COMPLICATIONS:  None.   CONDITION ON DISCHARGE:  Stable.   HISTORY OF PRESENT ILLNESS:  This is a 77 year old obese female with a past  history of DVT with PE, PAF, COPD, and hypertension.  She was seen  approximately three weeks prior to this admission with increasing shortness  of breath.  She was found to be an atrial fibrillation with a rapid rate of  110.  She was started on Cardizem and Coumadin.  Her Cardizem was recently  increased from 180 mg to 240 mg.  When seen a week afterwards her rate had  slowed somewhat to around 100.  She was feeling better, however, continued  having dyspnea on exertion.  At this admission today her heart rate was in  the 80s.  She was still in atrial fibrillation and having marked dyspnea on  exertion and wheezing.  She denied PND or orthopnea but states she could not  walk from her bedroom to the bathroom without becoming severely short of  breath.  She denies any fever or chills.  Denied a cough.   She underwent a nuclear study May 2003, which was negative for ischemia and  showed normal LV  function with an EF of 61%.  Her last echocardiogram was  done in December 2001, which showed a moderately dilated LA at 5.2 cm.  She  was also noted to have mild AF, AI, and MR.   PHYSICAL EXAMINATION:  VITAL SIGNS:  On admission, blood pressure 126/84,  heart rate 86   Dictation ended at this point.     Adrian Saran, N.P.                        Thereasa Solo. Little, M.D.    HB/MEDQ  D:  04/15/2002  T:  04/15/2002  Job:  [id number]

## 2010-07-13 NOTE — Consult Note (Signed)
[place]  Patient:    [name], [name] Visit Number: [id number] MRN: [id number]          Service Type: MED Location: [id number] Attending Physician:  Phifer, Trinna Post Dictated by:   Adolph Pollack, M.D. Proc. Date: 03/27/01 Admit Date:  03/27/2001   CC:         Dineen Kid. [name], M.D.   Consultation Report  REASON FOR CONSULTATION:  Abdominal distention with some nausea and air/fluid levels on x-ray.  HISTORY OF PRESENT ILLNESS:  [name] is a 77 year old female followed by Dr. Donia Guiles in the [facility].  She states she has had progressively increasing abdominal distention that has been going on slowly for a number of weeks but has worsened in the past two to three days.  She states she has been down in the clinic twice this week to be evaluated for this.  She felt bad enough to come to the emergency department to be evaluated tonight with chief complaint of increasing abdominal distention and shortness of breath, not really a significant complaint of abdominal pain at the time. Her shortness of breath seemed to be exacerbated by her abdominal distention. She had been having some loose stools and was passing small amounts of gas. She has noted her stool caliber getting smaller over the past number of weeks. She says that she has had two small episodes of emesis.  She denies any fevers or chills.  PAST MEDICAL HISTORY:  Remarkable for type 2 diabetes mellitus, hypertension, heart disease, atrial fibrillation, morbid obesity, gout, deep venous thrombosis, pulmonary embolism with indwelling Greenfield filter, hypothyroidism, and cerebrovascular accident.  PAST MEDICAL HISTORY:  Cholecystectomy, appendectomy, gastric bypass, breast biopsy x 3, and placement of inferior vena cava filter.  MEDICATIONS:  Diltiazem, potassium, lescol, Levoxyl, Hyzaar, Aciphex, metformin, Paxil, allopurinol, Clonidine,  oxybutynin.  SOCIAL HISTORY:  She is a retired Engineer, civil (consulting).  No tobacco or alcohol use.  REVIEW OF SYSTEMS:  She has had nausea for five days and small amounts of vomiting.  She has had diarrhea for the past five days as well.  She reports having guaiac-positive stools at the outpatient clinic.  Here in the emergency department, she has had guaiac-negative stool noted.  PHYSICAL EXAMINATION:  GENERAL:  Massively obese female who is somewhat tachypneic.  VITAL SIGNS:  Temperature 97.1, blood pressure 134/89, pulse 107.  ABDOMEN:  Obese with firmness over a midline incision and mild tenderness at this point.  There is also a right lower quadrant incision present. Hypoactive bowel sounds are noted.  LABORATORY DATA:  Remarkable for hemoglobin 15.8 with white blood cell count of 21,000.  Potassium 3.3, creatinine 1.4.  SGOT 552, SGPT 48, alkaline phosphatase 174, lipase normal.  Abdominal x-rays demonstrate no free air and no dilated bowel loops, but there are air/fluid levels noted.  Gas in the colon is noted.  IMPRESSION:  Progressive abdominal distention with nausea and now having some pain, likely secondary to a partial intestinal obstruction.  This may be secondary to adhesions, incarcerated ventral or incisional hernia, or a neoplasm.  I went ahead and tried to pass a nasogastric tube, but she could not tolerate it going in her nose.  I passed it orally and evacuated some air and 100 cc of thick green fluid, and she felt some better and was actually softer on exam.  RECOMMENDATIONS:  I would try NG tube compression if she would tolerate it.  I would hydrate her,  then get a CT of the abdomen and pelvis with oral IV contrast.   She may need to be treated with empiric antibiotics and may need exploratory laparotomy, although her morbidity and mortality certainly will be increased given all the comorbid illnesses she has. Dictated by:   Adolph Pollack, M.D. Attending Physician:   Phifer, Trinna Post DD:  03/27/01 TD:  03/29/01 Job: [id number]

## 2010-07-13 NOTE — Discharge Summary (Signed)
NAMESERENITY, BATLEY              ACCOUNT NO.:  [id number]   MEDICAL RECORD NO.:  [id number]          PATIENT TYPE:  INP   LOCATION:  2016                         FACILITY:  [facility]   PHYSICIAN:  Rufina Falco, M.D.     DATE OF BIRTH:  1932/06/05   DATE OF ADMISSION:  03/24/2006  DATE OF DISCHARGE:  03/27/2006                               DISCHARGE SUMMARY   DISCHARGE DIAGNOSES:  1. Abdominal pain/nausea/vomiting/ventral hernia.  2. CAD/AFib/elevated INR at admission.  3. Dizziness.  4. Hypertension.  5. Hyperlipidemia.  6. Hypothyroidism.  7. Asthma/restrictive lung disease/questionable COPD.  8. GERD.  9. Gout.  10.Urinary incontinence.  11.Anxiety/depression.  12.Arthritis.  13.Nonischemic cardiomyopathy, ejection fraction 40%.  14.Diabetes mellitus.   DISCHARGE MEDICATIONS:  Include:  1. Amaryl 4 mg p.o. q.daily.  2. Oxybutynin 5 mg p.o. q.daily.  Patient is to hold this medication      for three more days to ascertain if her dizziness improves.  3. Lipitor 40 mg p.o. q.daily.  4. Levothyroxine 175 mcg p.o. q.daily.  5. Proventil HFA one to two puffs b.i.d.  6. Colchicine 0.6 mg p.o. b.i.d.  7. Diltiazem HCL 300 mg p.o. q.daily.  8. Benadryl 25 mg p.o. q.h.s.  9. Singulair 10 p.o. q.daily.  10.Omeprazole 20 mg p.o. b.i.d.  11.Fluoxetine 20 mg p.o. q.daily.  12.Advair 250/50 mcg b.i.d.  13.Spiriva 18 mcg q.daily.  14.Extra-Strength Tylenol two pills q.a.m. and two pills q.p.m.  15.Coumadin.  Patient has been instructed to take 5 mg each day until      her follow up with Dr. Alexandria Lodge in the outpatient clinic at which time      an INR will be checked.   DISPOSITION AND FOLLOWUP:  Patient is to follow up with Dr. Michaell Cowing at  [facility] on April 07, 2006 at 1:45 p.m. at which time  the discussion of ventral hernia repair will be done, patient is also to  return to Dr. Alexandria Lodge in the outpatient clinic on March 31, 2006 at  11:45 to check her INR and patient  is also to return to Dr. Maple Hudson in the  outpatient clinic on April 09, 2006 at 10:00 a.m. to check a B-Met,  INR and a PT.   PROCEDURES AND TESTS:  Include:  1. CT of the head without contrast.  Impression:  1)  No evidence of      acute intracranial abnormality.  2)  Remote left frontal infarct.  2. CT abdomen results.  Impression:  1)  Mild basilar ground      glass/interstitial opacity suggesting mild infection and      inflammation.  2)  Moderate supraumbilical paramedian ventral      hernias containing colon without evidence of bowel obstruction.  3. CT of the pelvis without contrast with these findings:  No evidence      of free fluid, enlarged lymph nodes or masses.  The visualized      bowel is within normal limits.  Impression:  No acute abnormality.  4. Chest x-ray was also done which showed these results:  1)  Right-  sided PICC line tip in the SVC.  2)  Improved perihilar opacities,      although perihilar interstitial opacity and patent lingular      airspace opacity persist.  This could be due to atypical infection      or interstitial pulmonary edema.   CONSULTS:  Dr. Leonie Man of [facility]   CHIEF COMPLAINT:  Dizziness, shortness of breath, abdominal pain.   PRIMARY CARE PHYSICIAN:  Dr. Aundria Rud   HISTORY OF PRESENT ILLNESS:  Patient is a 77 year old Caucasian woman  with a past medical history significant for diabetes mellitus type 2,  obesity, hypertension, AFib on chronic Coumadin, CHF, CVA, history of  PE, history of CAD presenting to outpatient clinic with abdominal pain  secondary to ventral hernia, nausea and vomiting, and dizziness.  Patient's INR was 7.1 today.  Patient has had dizziness in the past,  including one episode last week, but reports that it is worse than it  has been.  Patient was also complaining of abdominal pain and bloating  that is shooting, stabbing, 10/10, intermittent, and started on the day  of admission.   Patient also complains of one episode of nonbloody  emesis.  For further details, please see hospital chart.   ALLERGIES:  No known drug allergies.   PAST MEDICAL HISTORY:  Includes:  1. Diabetes mellitus type 2.  2. Morbid obesity.  3. Hypertension.  4. Nonischemic cardiomyopathy with systolic dysfunction, ejection      fraction 40-to-50%.  5. Atrial fibrillation.  6. Asthma/restrictive lung disease.  7. History of PE with IVC filter placement.  8. CVA in September of 1997.  9. Osteoarthritis.  10.Hypothyroidism with a history of Graves' disease.  11.Gout.  12.Depression.  13.MI approximately 20 years ago.  14.Macular degeneration.  15.Cataracts x2.  16.Endometrial hyperplasia.  17.History of UTI.  18.GERD.  19.Stress urinary incontinence.   HOME MEDICATIONS:  Amaryl, oxybutynin, Lipitor, levothyroxine, Proventil  HFA, Coumadin, colchicine, Diltiazem HCL, ARBs, Benadryl, Singulair,  Fluoxetine, omeprazole, Advair Diskus, Spiriva, Extra-Strength Tylenol.   SUBSTANCE HISTORY:  Patient has remote history of tobacco abuse, she  denies alcohol, cocaine, IV drugs, THC.  She is widowed.  She is a  Medicare patient.  She lives with her son.   FAMILY HISTORY:  Significant for a mother who passed away of strokes and  father passed away of unknown reason.   REVIEW OF SYSTEMS:  As mentioned in the HPI.   VITAL SIGNS:  Temperature 97.0, blood pressure 158/78, O2 sats 92% on  room air, weight is 286.9.  Orthostatics were done.  Lying blood  pressure 148/79 with a pulse of 76, sitting blood pressure 133/76 with a  pulse of 78.  GENERAL:  Patient is in no acute distress, talking in full sentences.  EYES:  Extraocular muscles intact.  ENT:  Moist oral mucosa.  NECK:  Supple, no JVD, no bruits, no lymphadenopathy.  LUNGS:  Clear to auscultation bilaterally.  CVS:  S1,S2, irregularly irregular, positive murmur. GI:  Soft, obese.  Patient has right upper quadrant tenderness, mild   left lower quadrant tenderness.  Ventral hernia no longer palpated.  EXTREMITIES:  Mild pedal edema bilaterally, no cyanosis, positive  varicose vein.  SKIN:  No jaundice.  LYMPH:  No lymphadenopathy.  MUSCULOSKELETAL:  Normal range of motion.  NEURO:  Significant for decreased sensation in the bilateral lower  extremities.  PSYCH:  Appropriate.   ADMISSION LABS:  Include sodium of 140, potassium 3.4, chloride 100,  bicarb  30, BUN 12, creatinine 0.72, glucose 181, GFR greater than 60,  anion gap of 10, bilirubin 0.9, alk phos 99, AST 19, ALT 26, protein  6.1, albumin 3.2, calcium 8.8, WBC 5.9, hemoglobin 13.4, platelets 297,  MCV 88.1, ANC 4.2, RDW 16.4, INR equals 7.1, pH 7.477, PCO2 43.6, PO2  54.3, bicarb 31.9, PCO2 33.2, O2 sats 89.6.   HOSPITAL COURSE:  1. Patient's abdominal pain/nausea/vomiting resolved by day number two      of hospitalization.  A CT of the abdomen and pelvis was done with      the above results.  Dr. Lurene Shadow at [facility] was consulted for ventral      hernia repair and possibility of bowel incarceration.  Patient      reduced the hernia by herself and had resolution of abdominal pain,      nausea and vomiting.  Patient reports herniation occurs when she      eats too much.  An appointment has been set up with Dr. Michaell Cowing at      [facility] to arrange for outpatient repair of ventral hernia.  I      appreciate surgery's help with this patient.  2. CAD/AFib/elevated INR.  Patient was admitted with an INR of 7.1 so      patient's Coumadin was held at admission and patient was given      vitamin K to reverse effects of Coumadin.  Patient's INR came down      to 2.3 on day after admission then 2.0 the next day at which time      patient was given 5 mg of Coumadin.  On the day of discharge,      patient's INR was 1.6 and she was given 7.5 mg of Coumadin prior to      her discharge.  Patient was instructed to take 5 mg of Coumadin      everyday until followup with Dr. Alexandria Lodge at  outpatient clinic on      Monday, March 31, 2006.  Patient will need close followup for her      INRs.  Patient's for the most part pulse was rate controlled with      Diltiazem during her hospitalization with a few episodes of      tachycardia.  Of note, patient had troponin Is of 0.13 and 0.112      during hospitalization with no changes in EKG.  It was thought      likely secondary to COPD or her restrictive lung disease.  Patient      was chest pain free.  3. Dizziness.  Given the patient's history of CVA, a CT of the head      was done with the above-mentioned results.  Patient's dizziness is      chronic in nature.  Oxybutynin was held for possibly of causing the      patient's dizziness.  Patient was told that if the dizziness      continued despite cessation of oxybutynin for five days, then she      could resume it and follow up in the outpatient clinic with any      further episodes. 4. Hypertension.  No changes were made to patient's medications.      Patient was continued on her Diltiazem HCL.  5. Hyperlipidemia.  Patient is discharged home on her Lipitor.  6. Hypothyroidism.  TSH was 2.835 and normal during this      hospitalization.  Patient was continued on her  levothyroxine 175      mcg p.o. q.daily.  7. Asthma/restrictive lung disease/questionable COPD.  Patient was      continued on her home medications of Proventil HFA, Singulair,      Advair and Spiriva at discharge.  8. GERD.  Patient was continued on Protonix.  9. Gout.  Patient was continued on her colchicine.  10.Urinary incontinence.  Oxybutynin was held secondary to the      possibility of causing patient's dizziness.  Patient was to resume      medication if dizziness persists despite cessation of oxybutynin.  11.Anxiety/depression.  Patient was continued on her Prozac, patient      denied suicidal ideation.  12.Arthritis.  Extra-Strength Tylenol was continued.  Patient has a      rolling walker at home  and reported no new signs of weakness or      changes in her neuro exam so a PT/OT consult was not undertaken for      this reason.  13.Nonischemic cardiomyopathy.  14.Diabetes mellitus.  Patient was continued on Amaryl during      hospitalization with pretty good control of her blood glucose      except for a CBG of 207 on the day of discharge.   DISCHARGE LABS:  Include WBC 5.2, hemoglobin 11.7, hematocrit 34.6, MCV  88.7, RDW 16.8, platelets 316, ANC 3.3, PT 19.4, INR 1.6, D-dimer less  than 0.22, sodium 142, potassium 3.6 which was supplemented with K-Dur  prior to discharge, chloride 104, bicarb 31, glucose 139, BUN 9,  creatinine 0.73, GFR greater than 60, calcium 8.3, TSH 2.835.   DISCHARGE VITALS:  Include temperature 96.9, pulse 98, respiration 20,  blood pressure 128/77, O2 sats 93% on four liters, patient is on four  liters at home, that is her baseline, CBG 114.      Rufina Falco, M.D.  Electronically Signed     JY/MEDQ  D:  03/30/2006  T:  03/30/2006  Job:  [id number]   cc:   Ardeth Sportsman, MD  Leonie Man, M.D.  Dr. Maple Hudson  Dr. Alexandria Lodge

## 2010-07-13 NOTE — Discharge Summary (Signed)
[place]  Patient:    [name], [name] Visit Number: [id number] MRN: [id number]          Service Type: MED Location: [phone] Attending Physician:  Madaline Guthrie Dictated by:   Hillery Aldo, M.D. Admit Date:  03/27/2001 Discharge Date: 03/31/2001   CC:         Dineen Kid. [name], M.D.   Discharge Summary  DISCHARGE DIAGNOSES:  1. Partial small bowel obstruction.  2. Diabetes mellitus, type 2.  3. Hypokalemia.  4. Morbid obesity.  5. Hypothyroidism.  6. Hypertension.  7. History of pulmonary embolus status post inferior vena cava filter.  8. History of gastric fundoplication.  9. Chronic obstructive pulmonary disease. 10. History of atrial fibrillation. 11. Gouty arthritis. 12. History of coronary artery disease. 13. History of cerebrovascular accident.  DISCHARGE MEDICATIONS:  1. Aciphex 40 mg p.o. q.d.  2. Cardizem CD 180 mg p.o. q.d.  3. Catapres 0.1 mg p.o. b.i.d.  4. Synthroid 175 mcg p.o. q.d.  5. Serevent 1 puff b.i.d.  6. Oxybutynin 5 mg p.o. b.i.d.  7. Paxil 20 mg p.o. q.d.  8. Lescol 40 mg p.o. q.d.  9. Hydrochlorothiazide 25 mg p.o. q.d. 10. Phenergan 25 mg p.o. q.6h. p.r.n.  FOLLOWUP:  The patient will follow up with her primary care physician, Dr. [name], on April 13, 2001, at 11 a.m.  At that time, Dr. [name] will make a referral to see Dr. Sung Amabile of pulmonary care to work up question of mild alveolitis.  PROCEDURES/DIAGNOSTIC STUDIES: 1. CT scan of the pelvis and abdomen on March 28, 2001:  Oral contrast has    passed throughout the bowel to the rectum without evidence of obstruction.    No free fluid or air is seen within the pelvis.  There is a 3 mm    calcification located near the right posterior bladder that probably is not    blocking the ureter. 2. EKG on March 27, 2001:  Normal sinus rhythm with a rate of 94 beats per    minute.  There is left axis deviation.  Since previous tracing on  January 08, 2001, the heart rate is slower.  The R voltage is improved    in V3-V4.  Loss of R-wave in V6 probably due to lead placement.  CONSULTANTS:  General surgery, Dr. Abbey Chatters.  ADMISSION HISTORY AND PHYSICAL:  Briefly, the patient is a 77 year old white female who presented with 2-3 weeks of progressive abdominal distention and intermittent pain which had worsened over the few days prior to her admission. The patient reported nausea and diarrhea as well as two episodes of vomiting shortly before her admission.  The patient was seen in the outpatient clinic twice for this complaint in the week prior to her admission.  She denied any fever or chills.  She was seen in the emergency department and diagnosed with partial small bowel obstruction.  General surgery was consulted at that time. An attempt was made to decompress her GI tract with an NG tube, however, the patient pulled this out because she could not tolerate it.  The patient was placed empirically on Unasyn to cover GI pathogens secondary to elevated white blood cell count.  She had a CT scan which showed a small hernia with a knuckle of bowel in it, but no obstruction.  Surgery felt that the hernia was not contributing to her current symptoms.  They recommended the patient be treated conservatively.  ADMISSION PHYSICAL EXAMINATION:  GENERAL:  Revealed an obese white female in no apparent distress.  HEAD/NECK:  Normal.  LUNGS:  Normal.  HEART:  Revealed a grade 2 systolic ejection murmur at the left upper sternal border.  ABDOMEN:  Revealed a soft abdomen with decreased bowel sounds.  She had a tender midepigastric area in the location of her umbilical hernia.  There was no rebound or guarding.  EXTREMITIES:  Normal.  LABORATORY:  Initial laboratory values revealed a WBC of 21.0, hemoglobin 15.8, platelets 430.  Her amylase was 27.  Her lipase was 15.  Her TSH was 4.386.  Her chemistries showed a sodium of  137, potassium 4.3, chloride 101, bicarb 25, BUN 24, creatinine 1.9.  Her cardiac enzymes showed a CK of 46, CK MB of 0.9, and troponin of 0.36.  This troponin has been similarly elevated in the past.  HOSPITAL COURSE:  #1 - ABDOMINAL PAIN:  The patient improved dramatically with some IV hydration and supportive treatment including p.r.n. Phenergan.  She continued to have some diarrhea, however, her nausea and vomiting quickly resolved.  No surgical intervention was necessary.  She completed several days of Unasyn therapy for empiric coverage, but this was discontinued one day prior to her discharge. She did well with conservative management alone.  #2 - DIABETES:  Her metformin was held.  When she sees Dr. [name] in followup, he may wish to restart this medication.  Her blood glucoses showed fairly good control with them being in the mid-100s.  #3 - ELEVATED CREATININE:  Her creatinine normalized with IV fluid hydration.  #4- HYPERTENSION:  The patient was maintained on her home medication regimen. She reported that she does not take Cozaar anymore secondary to side effect of palpitations.  #5 - HYPOTHYROIDISM:  The patient was maintained on her Synthroid with a normal TSH on laboratory exam.  #6 - HYPERCHOLESTEROLEMIA:  The patient was maintained on her home regimen of Lescol.  #7 - DEPRESSION:  The patient was maintained on Paxil as per her home regimen.  #8 - URINARY INCONTINENCE:  Long-standing problem.  The patient was maintained on oxybutynin for this after her GI symptoms improved.  DISCHARGE INSTRUCTIONS:  The patient was given an appointment to follow up with Dr. [name].  Additionally, transportation was arranged through social work.  She reports that sometimes transportation is a problem getting to her medical doctor appointments, so she was given a number of a senior service that provides rides for seniors in need.   DISCHARGE LABORATORY VALUES:  Included a white  count of 7.2, hemoglobin 13.7, platelets 379.  Sodium was 141, potassium 4.5, chloride 108, bicarb 27, BUN 7, creatinine 0.7, glucose 164.  Clostridium difficile on her stool was negative. ictated by:   Hillery Aldo, M.D. Attending Physician:  Madaline Guthrie DD:  03/31/01 TD:  04/01/01 Job: [id number]

## 2010-07-13 NOTE — Consult Note (Signed)
[place]  Patient:    GOLDEN, EMILE Visit Number: [id number] MRN: [id number]          Service Type: MED Location: [phone] Attending Physician:  Madaline Guthrie Dictated by:   Roosvelt Harps, M.D. Proc. Date: 04/10/01 Admit Date:  04/09/2001   CC:         Madaline Guthrie, M.D.   Consultation Report  REASON FOR CONSULTATION:  Ms. Bihl is a 77 year old morbidly obese retired Engineer, civil (consulting) whom I am asked to see for recurrent and persisting nausea, vomiting, and diarrhea. She reports to me that for many years she has had alternating constipation and diarrhea which has been attributed to irritable bowel syndrome. She has had a colonoscopy by Dr. Arlyce Dice approximately 5 years ago that was negative by her report. She was recently admitted to the hospital approximately 2 weeks ago with similar symptoms and suspected of having partial small bowel obstruction based on an abdominal x-ray series. However, a followup CT scan did not confirm this. A midline abdominal hernia was seen without any incarcerated bowel. She was discharged from the hospital but her symptoms progressed, and she became dehydrated with an elevated BUN and creatinine. For the past 48 hours, however, again she seems to have improved. She has had no vomiting in the last 24 hours, although she has felt nauseous. She is reporting approximately 4 episodes of loose nonbloody diarrhea daily at present. Abdominal x-ray series when she came in suggested a dilated loop of small bowel in the right upper quadrant which could again indicate a partial small bowel obstruction. However, the films today show no gas in the small bowel and a liquid in the colon consistent with a diarrheal process. The patient has had no hematemesis or melena. She has generalized vague abdominal discomfort. She is morbidly obese and has been gaining weight.  PAST MEDICAL HISTORY:  1. Atrial fibrillation, however, she  currently appears to be in normal sinus     rhythm on recent EKG.  2. Status post fundal plication about 30 years ago.  3. Status post cholecystectomy.  4. Status post IVC filter for pulmonary embolism 3 1/2 years ago.  5. Coronary artery disease status post MI 10 years ago.  6. Diabetes type 2.  7. Morbid obesity.  8. Hypothyroidism on supplementation.  9. Hypertension. 10. Gouty arthritis. 11. Bladder incontinence.  CURRENT MEDICATIONS AND ADMISSION MEDICATIONS: Include Unasyn started in the hospital, Aciphex 40 mg q.d., Paxil 20 mg q.d., diltiazem 180 mg q.d., clonidine 0.1 mg b.i.d., Klor-Con 10 four times daily, Lescol XL 80 mg q.d., oxybutynin 5 mg b.i.d., aspirin, Hyzaar 100/25 mg q.d., Levoxyl 175 mcg q.d., Serevent, loperamide p.r.n.  ALLERGIES:  VIOXX.  SOCIAL HISTORY:  She is married, retired Char[employer] fundraiser. No alcohol use, former cigarette smoker.  FAMILY HISTORY:  Negative for ulcers, gallstones, inflammatory bowel disease, or colorectal neoplasia.  REVIEW OF SYSTEMS:  GENERAL:  Weight gain. No night sweats. ENDOCRINE: Positive for diabetes type 2 currently diet managed and hypothyroidism on supplementation. SKIN:  No rash or pruritus.  EYES:  No icterus or change in vision.  ENT:  History of GERD, no aphthous ulcers, or chronic sore throat. RESPIRATORY:  Shortness of breath with exertion. CARDIAC:  As above. GASTROINTESTINAL:  As above.  GENITOURINARY:  Bladder incontinence.  PHYSICAL EXAMINATION:  GENERAL:  On physical exam she is a morbidly obese female in no acute distress. Afebrile.  VITAL SIGNS:  Blood pressure 130/51, pulse is 83 and regular.  SKIN:  Normal.  EYES:  Anicteric.  THROAT:  Oropharynx unremarkable.  NECK:  Supple without thyromegaly. There is no cervical or inguinal adenopathy.  CHEST:  Sounds clear.  HEART:  Regular rate and rhythm.  ABDOMEN:  Morbidly obese and has generalized tenderness. There is a ventral hernia present. I do not  appreciate any masses. Bowel sounds are present. There is no rebound.  RECTAL:  Not performed.  EXTREMITIES:  Obese with trace edema.  LABORATORY DATA:  Reveals a hemoglobin of 13 with a white count of 11.3. Electrolytes are within normal limits today. Blood sugar 153. BUN 55, creatinine 2.9, lipase 16. C. difficile toxin titer was negative on last admission on March 30, 2001.  IMPRESSION:  Morbidly obese diabetic with persisting nausea although the vomiting has stopped. She is also having nonbloody diarrhea. An x-ray today shows no clear evidence of small bowel obstruction. It may be that she simply has a gastroenteritis, a prolonged viral syndrome, another possibility is that she is on multiple medications that can affect gastrointestinal motility including diltiazem, clonidine, oxybutynin, and Paxil. She could have a generalized hypomotility of the gut with resulting bacterial overgrowth and diarrhea which would be best treated with a change of medications and oral antibiotics. This diagnosis is hard to establish however. Another possibility would be that she has Clostridium difficile toxin titer diarrhea related to antibiotics administered on her last admission as well.  RECOMMENDATIONS:  I do not see any need for acute endoscopic studies at this time. I am not convinced her Unasyn needs to be continued either. Stools should be obtained again for C. difficile toxin titer. Consideration should be given to simplifying her regimen and discontinuing some of the medications. Could she be managed as affectively on ACE inhibitors or angiotensin inhibitors? Should symptoms progress, serial abdominal x-ray series should be followed. I will follow this patient along with you and make further recommendations as the need arises. Dictated by:   Roosvelt Harps, M.D. Attending Physician:  Madaline Guthrie DD:  04/10/01 TD:  04/10/01 Job: [id number]

## 2010-07-13 NOTE — Cardiovascular Report (Signed)
   [name], [name]                          ACCOUNT NO.:  [id number]   MEDICAL RECORD NO.:  [id number]                   PATIENT TYPE:  OIB   LOCATION:  2899                                 FACILITY:  [facility]   PHYSICIAN:  Thereasa Solo. Little, M.D.              DATE OF BIRTH:  09/28/1932   DATE OF PROCEDURE:  02/24/2002  DATE OF DISCHARGE:                              CARDIAC CATHETERIZATION   CARDIOVERSION REPORT   INDICATIONS FOR PROCEDURE:  The patient is a 77 year old obese female who  has normal LV function and intermittent PAS.  She has COPD.  She went into  atrial fibrillation approximately five weeks ago.  Her ventricular response  has been controlled and she has been well anticoagulated.  She is brought in  for outpatient elective cardioversion.   DESCRIPTION OF PROCEDURE:  With anesthesia's assistance she was given a  total of 135 mg of IV sodium Pentothal.  After appropriate level of  anesthesia was reached the patient was electively cardioverted with 360 watt  seconds using a biphasic current.  She converted with a single shock into  sinus rhythm with a rate of 70.   CONCLUSION:  Successful elective cardioversion from atrial fibrillation to  sinus rhythm.                                               Thereasa Solo. Little, M.D.    ABL/MEDQ  D:  02/24/2002  T:  02/24/2002  Job:  [id number]   cc:   [facility]

## 2010-07-13 NOTE — Discharge Summary (Signed)
[name], [name]              ACCOUNT NO.:  [id number]   MEDICAL RECORD NO.:  [id number]          PATIENT TYPE:  INP   LOCATION:  2027                         FACILITY:  [facility]   PHYSICIAN:  Ramiro Harvest, MD    DATE OF BIRTH:  [birth year]   DATE OF ADMISSION:  02/19/2006  DATE OF DISCHARGE:  02/28/2006                               DISCHARGE SUMMARY   DISCHARGE DIAGNOSES:  1. Shortness of breath/wheezing secondary to chronic asthmatic      bronchitis versus a chronic obstructive pulmonary disease      exacerbation with possible underlying pneumonia.  2. Urinary tract infection.  Urine cultures positive for Klebsiella.  3. Atrial fibrillation, rate controlled with diltiazem.  4. Diabetes mellitus, type 2.  Hemoglobin A1C 6.8.  5. History of congestive heart failure.  Ejection fraction of 40 to      50%.  6. Hypertension.  7. Metabolic alkalosis which is chronic secondary to chronic carbon      dioxide retention.  8. Depression.  9. Urinary incontinence.  10.Occult rectal bleeding with a stable hemoglobin.  11.Hyperlipidemia.  12.Hypothyroidism.  13.Hypomagnesemia.  14.History of pulmonary embolism status post inferior vena cava filter      on chronic Coumadin therapy.  15.History of osteoarthritis.  16.History of status post cerebrovascular accident in September of      1997 with some residual left-sided hemiplegia.  17.Morbid obesity, status post gastric plication.  18.Bradycardia on metoprolol.  19.History of gout.  20.History of endometrial hyperplasia.   DISCHARGE MEDICATIONS:  1. Advair 250/50 Diskus, one puff twice a day.  2. Amaryl 4 mg daily.  3. Colchicine 0.6 mg daily.  4. Lipitor 40 mg daily.  5. Coumadin 5 mg Monday through Sunday, 2.5 mg on Wednesday.  6. Hyzaar 100/25 one tablet daily.  7. Ditropan 5 mg one tablet daily.  8. Omeprazole 20 mg twice a day.  9. Fluoxetine 20 mg daily.  10.Singulair 10 mg at bedtime.  11.Synthroid 175 mcg daily.  12.Spiriva 18 mcg inhaler, one puff daily.  13.Diltiazem 300 mg daily.  14.Albuterol inhaler one to two puffs q.6h. p.r.n.  15.Prednisone 50 mg daily x2 days, 40 mg daily x2 days, 30 mg x2 days,      20  mg x2 days, 10 mg daily x2 days and then off.  16.Home O2.   DISPOSITION/FOLLOWUP:  The patient will follow up with Dr. Aundria Rud at the  [facility] on March 03, 2006 at 9:45 a.m.  The  patient will need pulmonary function tests and possible polysomnography  for likely obstructive sleep apnea.  The patient will have Home Health  to assist with her at home.  A basic metabolic profile will need to be  checked to monitor the patient's electrolytes.   PROCEDURES:  1. Portable chest x-ray was done on February 19, 2006 which showed      subsegmental atelectasis bilaterally.  2. On February 20, 2006, portable chest x-ray was also done which      showed cardiomegaly with low volumes, and also there was chronic      interstitial lung disease with  progressing right upper lobe      atelectasis or infiltrate.  3. On February 21, 2006, portable chest x-ray showed COPD/emphysema,      cardiac enlargement without failure, atelectasis/infiltrate of the      right upper lobe, low lung volumes with bibasilar atelectasis.  4. On February 22, 2006, chest x-ray showed diffuse patchy airspace      disease, right greater than left.  Differential includes edema      versus infection.  5. On February 24, 2006, chest x-ray showed improvement in edema      pattern, residual cardiac enlargement, perihilar airspace disease      versus edema, and scattered areas of atelectasis.  6. On February 26, 2006, chest x-ray showed no marked interval change in      appearance of the chest x-ray.  7. 2-D echocardiogram was obtained on February 26, 2006 which showed      overall left ventricular systolic function normal, EF estimated at      55%.  The study was inadequate for evaluation of left ventricular       regional wall motion.  Left ventricular wall thickness was mildly      increased.  The aortic valve thickness was mildly increased.  Mild      aortic valve regurgitation, mild-to-moderate mitral annular      calcification.  There was mild mitral valve regurgitation.  Left      atrium mildly dilated.  Technically difficult.  If clinically      indicated, transesophageal echocardiogram or MUGA would provide      additional information.   CONSULTATIONS:  None.   HISTORY OF PRESENT ILLNESS:  [name] is a 77 year old white  female with past medical history significant for asthma, atrial  fibrillation with history of PE and status post an IVC filter on chronic  anticoagulation on Coumadin, history of obesity, hypertension, and  nonischemic cardiomyopathy who had presented with a five-day history of  progressive shortness of breath and cough productive of yellowish  sputum.  The patient stated that she usually treats her asthma with  albuterol MDI four times a day and nebulizers as needed.  The patient  stated that she had been using her nebulizers approximately 30 minutes  without any relief.  She denied any sick contacts, no chills, no nausea,  no vomiting, no urinary symptoms.  The patient had stated that she had a  temperature of 97, and her temperature has been ranging within 98 to 99  degrees Fahrenheit over the past few days.  The patient denied any chest  pain or any other complaints at that point in time.   PHYSICAL EXAMINATION:  VITAL SIGNS:  Temperature 98.5, blood pressure  160/67, pulse 106, respiratory rate 28, saturating 100% on 8 liters  nasal cannula.  GENERAL:  The patient was speaking in complete and full sentences on  oxygen and did not show any use of accessory muscles of respiration.  HEENT:  Normocephalic, atraumatic.  Pupils are equal, round, and reactive to light.  Extraocular movements intact.  Oropharynx was clear  and moist.  No erythema, no exudates.   NECK:  Supple, no lymphadenopathy.  RESPIRATORY:  The lungs did have wheezes bilaterally in the bilateral  upper lung fields, decreased breath sounds at the bases.  No rales or  crackles.  CARDIOVASCULAR:  Heart was irregularly irregular.  No murmurs or  gallops.  ABDOMEN:  The patient's abdomen was obese, soft, nontender,  nondistended.  Positive  bowel sounds.  No rebound, no guarding.  EXTREMITIES:  No clubbing, cyanosis, or edema.  1+ dorsalis pedis pulses  and 1+ radial pulses.  NEUROLOGIC:  The patient was alert and oriented x3.  Cranial nerves II-  XII were grossly intact.  Cerebellar was intact.  Sensation was intact.  Gait not tested.   ADMISSION LABORATORY DATA:  White count 7.2, hemoglobin 13.2, hematocrit  40.6, MCV 89.5, RDW 13.7, platelets 280, an absolute neutrophil count  5.2.  PT 21.6, INR 1.8, PTT 42.  Sodium 136, potassium 3.3, chloride 94,  bicarb 32, BUN 12, creatinine 0.7, glucose 158, calcium 8.8.  Total  protein 6.9, albumin 3.0, AST 24, ALT 30, alkaline phosphatase 143,  total bilirubin 0.8, direct bilirubin 0.1, indirect bilirubin 0.7,  magnesium 1.7.  Hemoglobin A1C 6.8.  Initial cardiac enzymes revealed a  CK of 33, CK-MB 1.1, troponin I of 0.15.  Urinalysis was yellow, cloudy  appearance, with a specific gravity of 1.028, pH of 6, glucose negative,  hemoglobin negative, bilirubin negative, ketones 50, protein 30,  urobilinogen 1.0, nitrate-negative, leukocyte esterase moderate,  epithelial cells few, white blood cells 11-20, RBCs 0-2, bacteria many.  Urine culture was greater than 100,000 colonies and showed a Klebsiella  pneumonia.  Nasal influenza A and influenza B were both negative as  well.  Venous blood gas showed a pH of 7.51, PCO2 of 68, PO2 of 123,  with a bicarb of 34.8.   HOSPITAL COURSE:  Problem 1.  Shortness of breath/wheezing.  The patient  had presented with some worsening shortness of breath and wheezing which  had been occurring a few  days prior to admission.  The patient had used  albuterol nebulizers and inhalers without any relief.  On admission, the  patient was put on IV Solu-Medrol and was put on albuterol and Atrovent  nebulizer treatments.  Albuterol was then changed to Xopenex secondary  to the patient's tachycardia.  As well, the patient was also started on  Rocephin and azithromycin to cover for possible community-acquired  pneumonia.   During her hospitalization, the patient's shortness of breath improved  slowly but steadily.  The patient continued to improve during the  hospitalization.  She had a few days where her shortness of breath and  wheezing were worsening.  As such, she was kept on IV Solu-Medrol for  longer than needed and slowly converted to p.o. prednisone.  It was felt  that some of her shortness of breath was also exacerbated by her deconditioning and obesity.  During the hospitalization, the patient's  Rocephin and azithromycin were changed to Levaquin on February 22, 2006  to also cover for UTI which was found by culture to have Klebsiella in  it.  The patient was continued on these antibiotics and improved  steadily.  The Levaquin was discontinued.  Physical therapy also came  and saw the patient.  Home Health was also arranged.   On discharge, the patient was discharged in improved and stable  condition.  The patient was discharged on a longacting beta-agonist as  well as inhaled steroids.  In addition to her albuterol nebulizers, she  was also put on Spiriva for better control of her chronic asthmatic  bronchitis and possible underlying COPD.   Problem 2.  Urinary tract infection on admission.  Although the patient  was asymptomatic, urinalysis and urine culture showed the patient had a  urinary tract infection.  The patient was initially on Rocephin and  azithromycin for problem #1  until culture results returned showing that  her urinary tract infection was secondary to Klebsiella  and sensitive to  ciprofloxacin.  The patient's antibiotics were then changed accordingly,  and the patient was treated with a full course of Levaquin for her  urinary tract infection.  On discharge, the patient's urinary tract  infection was resolved.   Problem 3.  Atrial fibrillation.  The patient initially came in slightly  tachycardic.  She was put on a diltiazem drip and then converted to oral  diltiazem.  The patient was anticoagulated with Coumadin, and the  patient's atrial fibrillation remained stable throughout the  hospitalization.  The patient was discharged on oral diltiazem and  Coumadin therapy.   Problem 4.  History of PE status post IVC filter.  This was stable  during this admission.  The patient's Coumadin was condition throughout  her hospitalization.   Problem 5.  Diabetes mellitus.  This was stable.  The patient was  initially put on a sliding scale insulin, and her blood glucose  continued to increase secondary to the IV Solu-Medrol the patient was  one.  Her CBGs improved as her steroids were tapered down.  The patient  was kept on NPH during this hospitalization and then converted back to  her oral hypoglycemics during the hospitalization.  The patient was sent  home on Amaryl 4 mg daily.   Problem 6.  Depression.  This was stable during the hospitalization.  The patient did not experience any suicidal ideation or homicidal  ideation.  The patient was maintained on Prozac throughout this  hospitalization.   Problem 7.  Hypokalemia secondary to albuterol use.  The patient's  magnesium was checked, and the patient's potassium was repleted  throughout the hospitalization.  On discharge, the patient's potassium  was 4.2.   All other medical issues were stable during the hospitalization, and the  patient was kept on her home medications throughout this  hospitalization.  DISCHARGE LABORATORY DATA:  PT 29.4, INR 2.6.  BMET showed a sodium of  141, potassium  4.2, chloride 90, bicarb 42, BUN 26, creatinine 0.7,  glucose 219, calcium 8.7, magnesium 2.1.  White count 9.0, hemoglobin  12.5, hematocrit 38.2, platelet count 350.   On discharge, [name] was discharged home in stable and improved  condition.  The patient was discharged home on oxygen as well.   It was a pleasure taking care of [name].      Ramiro Harvest, MD  Electronically Signed     DT/MEDQ  D:  03/27/2006  T:  03/27/2006  Job:  [id number]   cc:   Aundria Rud, M.D.

## 2010-07-13 NOTE — Discharge Summary (Signed)
[place]  Patient:    [name], [name] Visit Number: [id number] MRN: [id number]          Service Type: MED Location: [phone] Attending Physician:  Madaline Guthrie Dictated by:   Hillery Aldo, M.D. Admit Date:  04/09/2001 Discharge Date: 04/17/2001   CC:         Dineen Kid. [name], M.D.  Roosvelt Harps, M.D.   Discharge Summary  DISCHARGE DIAGNOSES:  1. Intermittent small-bowel obstruction with nausea, vomiting, and diarrhea.  2. Type 2 diabetes mellitus.  3. Hypertension.  4. Hypothyroidism with history of Graves disease.  5. Morbid obesity.  6. Urinary incontinence.  7. Chronic obstructive pulmonary disease/asthmatic bronchitis.  8. History of atrial fibrillation.  9. Gout. 10. History of pulmonary embolism, status post IVC filter. 11. Hypokalemia. 12. Acute renal failure, prerenal. 13. Persistently elevated troponin I. 14. Mild alveolitis. 15. Hypercholesterolemia.  DISCHARGE MEDICATIONS:  1. Serevent 1 puff b.i.d.  2. Hydrochlorothiazide 25 mg p.o. q.d.  3. Paxil 20 mg p.o. q.d.  4. Cardizem CD 180 mg p.o. q.d.  5. Catapres 0.1 mg p.o. b.i.d.  6. Synthroid 175 mcg p.o. q.d.  7. Aciphex 40 mg p.o. q.d.  8. Lescol 40 mg p.o. q.d.  9. Colchicine 0.6 mg p.o. b.i.d. 10. Oxybutynin 5 mg p.o. b.i.d.  CONSULTANTS:  Roosvelt Harps, M.D., GI.  PROCEDURES: 1. Acute abdominal series, April 10, 2001:  Air fluid levels and mildly    prominent colon.  No definite dilated small bowel.  The pattern is abnormal    but nonspecific and certainly can be seen in the setting of diarrheal    disease. 2. Acute abdominal series, April 11, 2001:  Continue dilated colon with air    fluid levels.  There is borderline dilatation of small bowel loops    currently seen. 3. Upper GI with small-bowel follow-through, April 13, 2001:  No    obstruction.  A 12-hour delay.  Delayed films revealed contrast in the    colon.  Again, no  small-bowel obstruction.  If symptoms persist, barium    enema would be suggested for further evaluation of the cecum which is felt    to be mobile and probably medially positioned.  HISTORY OF PRESENT ILLNESS:  [name] is a 77 year old white morbidly obese female who presented on April 09, 2001 with complaints of persistent nausea, vomiting, and diarrhea.  She had been hospitalized from March 26, 2001 to March 31, 2001 for similar complaints.  At the time of her discharge, she had clinically improved and was able to tolerate a normal diet without nausea, vomiting, or diarrhea.  CT scan at the time of her previous admission showed no evidence of small-bowel obstruction and a surgical consultation was obtained at that time, but it was determined that she did not have any need for a surgical intervention.  Since her discharge, she reports that she has had decreased appetite and limited p.o. intake.  She denied any hematemesis, hematochezia, or melena. Nausea was partially relieved by Phenergan which she was discharged on.  She was seen in the [facility] on April 08, 2001 with complaints as noted above. Although her symptoms had actually improved over the 48 hours prior to this, she was admitted for further workup and evaluation of her persistent symptoms.  She has had a prior diagnosis of possible irritable bowel syndrome.  She had had a colonoscopy per Dr. Arlyce Dice approximately five years ago that was negative per her report.  PHYSICAL EXAMINATION:  VITAL SIGNS:  Temperature 97.5, blood pressure 124/57, pulse 72, respirations of 20.  GENERAL:  Obese white female in no apparent distress.  HEENT:  Normocephalic and atraumatic.  Pupils are equal and round with the right being reactive and the left being nonreactive secondary to prior surgery.  Her oral mucosa was dry but otherwise clear.  NECK:  Supple.  No thyromegaly.  No lymphadenopathy.  No carotid bruits.  LUNGS:   Clear to auscultation bilaterally with good air movement.  HEART:  Regular rate and rhythm with a grade 2 systolic ejection murmur best heard at the left upper sternal border.  Nonradiating.  ABDOMEN:  Soft, obese.  Tender in the left lower quadrant.  Decreased bowel sounds but present in all four quadrants.  No rebound or guarding.  RECTAL:  Good tone with guaiac negative stools.  EXTREMITIES:  There is 1-2+ pitting edema with no clubbing or cyanosis.  LABORATORY DATA:  Admission laboratory values included a white blood cell count of 13.7, hemoglobin 13.2, hematocrit 39.1, and platelet count of 312,000.  She had 83% neutrophils and 15% lymphocytes on the differential. Her chemistries revealed a sodium of 132, potassium 4.3, chloride 101, bicarb 20, glucose 145, BUN 55, creatinine 3.0, calcium 7.8, total protein 6.6, albumin 3.1, AST 24, ALT 16, alkaline phosphatase 121, and total bilirubin of 1.2.  Cardiac enzymes revealed a CK of 86, CK-MB of 1.1, and troponin I 0.25.  Additional studies that were obtained through the course of her hospitalization included a TSH of 5.009, an RA factor of less than 20, ANA negative, and a second fecal occult blood test was positive.  At the time of her discharge, an ANCA is still pending.  EKG revealed a normal sinus rhythm with a rate of 73 beats per minute, PR interval of 208 ms, QRS duration 84 ms, and an axis of -25.  There was no evidence of atrial fibrillation.  There was no ST segment depression or T-wave inversion.  HOSPITAL COURSE: #1 - NAUSEA, VOMITING, DIARRHEA:  The patient was admitted and aggressively hydrated.  It was felt that her symptoms were most consistent with intermittent small-bowel obstruction, possibly secondary to adhesions or hernia.  She was put on proton pump inhibitor therapy and treated  conservatively.  She had a brief course of Unasyn secondary to her elevated white count but this was discontinued on her second  hospital day.  GI saw her in consultation and felt that a conservative approach would be best.  Through the course of her hospitalization, she gradually improved with completely resolved nausea, vomiting, and diarrhea.  A vasculitis workup was undertaken given her unusual alveolitis and unexplained persistently elevated troponins.  At the time of this dictation, there is no evidence of a vasculitic process; however, her ANCA is still pending.  Given her one of two guaiac positive stools, she will need a colonoscopy as an outpatient.  Dr. Roosvelt Harps will see her in follow-up and will do this to look for a possible explanation including inflammatory bowel disease, infiltrative disease, or other evidence of vasculitis.  #2 - GOUT:  The patient felt an acute flare-up of gout while hospitalized. Her Colchicine was initially stopped due to her GI symptoms.  This was restarted without any evidence of intolerance.  Her gout was greatly improved at the time of her discharge and she will be maintained on Colchicine for this.  #3 - INCREASED TROPONINS:  This appears to be chronic in nature as she  has had persistently elevated troponins without any subjective history of chest pain or angina since May 13, 2000.  A cardiology workup was not undertaken when she initially presented with these elevated troponins secondary to a negative nuclear study with an EF of 54% back in August of 2000.  Her troponins have ranged from 0.23 to 0.36 since the initial elevation back in March.  It is unclear as to why her troponins are elevated; however, her sedimentation rate has been slightly elevated, although we cannot preclude gout as being the source of her elevated sedimentation rate.  She will need further follow up with regard to this issue as an outpatient.  #4 - VAGINAL BLEEDING:  The patient developed some vaginal bleeding on April 13, 2001.  This resolved with no intervention.  Her CT scan from  her prior admission was reviewed with the radiologist and there was no obvious intrauterine pathology detectable.  She will need an outpatient GYN workup for this issue.  #5 - ACUTE RENAL FAILURE:  This was felt to be prerenal as her creatinine normalized with aggressive IV fluid rehydration.  At the time of her discharge, her creatinine is 0.8.  #6 - HYPOKALEMIA:  The patient was repleted both with oral and potassium runs through her veins.  At the time of her discharge, her potassium has normalized to 3.6.  #7 - HYPOTHYROIDISM:  Her TSH is within normal limits.  So, she was maintained on her usual dose of Synthroid.  #8 - DIABETES:  Her glucoses were checked a.c. and h.s.  She maintained good glycemic control and is not currently on any oral hypoglycemic agents.  This can continue to be monitored as an outpatient.  FOLLOW-UP:  The patient will follow up with Dr. Dineen Kid. Talbot at the outpatient clinic on April 27, 2001 at 10:15 a.m.  At that time, Dr. Dineen Kid. [name] will need to refer the patient for gynecologic evaluation for her episode of vaginal bleeding while hospitalized.  Also, he will need to follow up with ANCA results which are still pending at the time of discharge.  The patient will also follow up with Dr. Roosvelt Harps, of GI, on May 01, 2001 at 10:15 a.m.  At that time, Dr. Roosvelt Harps can evaluate and schedule the patient for GI workup including colonoscopy.  DISCHARGE INSTRUCTIONS:  The patient was instructed to resume activity as tolerated.  She is also to resume a diabetic diet as tolerated.  She understands to call the physicians office for any return of nausea, vomiting, or diarrhea.  Dictated by:   Hillery Aldo, M.D. Attending Physician:  Madaline Guthrie DD:  04/17/01 TD:  04/18/01 Job: [id number]

## 2010-07-13 NOTE — Discharge Summary (Signed)
[name], [name]                          ACCOUNT NO.:  [id number]   MEDICAL RECORD NO.:  [id number]                    PATIENT TYPE:  INP   LOCATION:                                       FACILITY:  [facility]   PHYSICIAN:  Thereasa Solo. Little, M.D.              DATE OF BIRTH:  1932/03/09   DATE OF ADMISSION:  01/28/2002  DATE OF DISCHARGE:                                 DISCHARGE SUMMARY   ADDENDUM TO DISCHARGE SUMMARY:   PHYSICAL EXAMINATION:  VITAL SIGNS:  On admission, blood pressure 126/84,  heart rate 86, respirations 18.  She is afebrile.  O2 saturations were 94%  on room air.  GENERAL:  Weight 242 pounds.  LUNGS:  Coarse breath sounds bilaterally with expiratory wheezes.  HEART:  Irregular rhythm, 1/6 systolic murmur.  ABDOMEN:  Grossly obese but benign with normal bowel sounds.  EXTREMITIES:  Lower extremities with +2 pitting edema.   LABORATORY DATA/STUDIES:  EKG:  Atrial fibrillation without ischemic changes  and a controlled ventricular response of 71.  Chest x-ray showed  cardiomegaly but no acute process.  Admission labs showed a normal CBC with  the exception of leukocytosis 11.8.  INR was slightly subtherapeutic at 3.3.  CMP was normal.  Cardiac enzymes were also normal.   HOSPITAL COURSE:  The patient was admitted to continue heart rate control on  present medications as well as full exacerbation of COPD.  Primary care was  requested to help with her evaluation and treatment for her COPD.  Her  nebulizers will be continued.  Will recheck a 2-D echocardiogram with plans  for outpatient cardioversion in approximately two weeks with a therapeutic  INR.   The patient was feeling better and breathing easier on the second day.  Blood pressure was better controlled.  No change in treatment at this point.  Blood gas done on 01/29/02 was most likely venous showing a pO2 of 40.  This  seems unlikely to be arterial with O2 saturations of 98%.  This was to be   rechecked.   She underwent 2-D echocardiogram on 01/29/02 which showed LVH but normal LV  function.  LA was moderately dilated at 5.9 cm.  RV was also mildly dilated.  She was also noted to have mild MR, AI and PR.   Repeat ABG was believable with pO2 of 145 on 2 liters of O2.  pCO2 was 38.   Per internal medicine, respiratory therapy became involved with peak flows,  etc.  She continued to improve from a pulmonary standpoint.  Atrial  fibrillation continued but with controlled ventricular response.  Steroids  were improving pulmonary status.  She did have elevated glucose levels, but  this was felt to be as a result of steroid therapy and should resolve as  they are tapered off.  Sliding scale coverage was initiated at this point.   On 02/02/02, the patient  had greatly improved.  Vital signs were stable, O2  saturations were good.  Atrial fibrillation was controlled.  Her INR was  therapeutic at 2.1.  Blood sugars were improving.  Steroids were tapering  down.  She was discharged to home.   DISCHARGE MEDICATIONS:  1. Plavix 240 mg daily.  2. Aciphex 20 mg daily.  3. Glyburide 2.5 mg daily.  4. Synthroid 0.175 mg daily.  5. Clonidine 0.1 mg twice a day.  6. Paxil 20 to 40 mg daily.  7. Lescol XL 80 mg daily.  8. Haldol 100/25 mg daily.  9. Lopressor 50 mg 1/2 b.i.d.  10.      Coumadin 5 mg daily at suppertime.  11.      Prednisone 40 mg x3 days and then 20 mg x3 days and then 10 mg x3     days and then discontinue.  12.      Serevent, Flovent and Atrovent as directed previously.   ACTIVITY:  As tolerated.   DIET:  She is to maintain a low salt, low fat, low cholesterol diet as well  as maintain her diabetic diet restrictions.   DISCHARGE INSTRUCTIONS:  She will need blood work to recheck PT, INR on  Tuesday, 02/09/02.  She is to see Gaspar Garbe B. Little, M.D. on Wednesday,  02/10/02 at 1:45 p.m.       Adrian Saran, N.P.                        Thereasa Solo. Little, M.D.     HB/MEDQ  D:  04/15/2002  T:  04/15/2002  Job:  [id number]

## 2010-07-13 NOTE — H&P (Signed)
[name], [name]              ACCOUNT NO.:  [id number]   MEDICAL RECORD NO.:  [id number]          PATIENT TYPE:   LOCATION:                                 FACILITY:   PHYSICIAN:  Ellwood Dense, M.D.   DATE OF BIRTH:  30-Oct-1932   DATE OF PROCEDURE:  DATE OF DISCHARGE:                    STAT - MUST CHANGE TO CORRECT WORK TYPE   PRIMARY CARE PHYSICIAN:  Production manager.   SURGEON:  Ardeth Sportsman, M.D.   CARDIOLOGIST:  Madaline Savage, M.D.   HISTORY OF PRESENT ILLNESS:  Ms. [name] is an obese 77 year old adult  female with a history of diabetes mellitus and ventral abdominal hernia  with recent small bowel obstruction and near strangulation of the bowels  January 2008.  She was admitted June 12, 2006 for repair of a ventral  hernia with lysis of adhesions performed by Dr. Michaell Cowing.  His  postoperative course was complicated by anemia requiring multiple  transfusions.  A total of eight units were transfused.  She has also had  urinary retention requiring replacement of a Foley.  Diarrhea was also  found along with peripheral edema requiring IV diuretics.   [facility] has been following the patient for medical  management.  Her Foley was discontinued today.  She has received 70/30  insulin for diabetes management and Amaryl was resumed today.  Her  diarrhea has resolved and leukocytosis has improved.  She was noted to  have deconditioning along with decreased balance.  She is able to sit at  the edge of the bed for approximately 5 minutes.  She continues to have  issues regarding abdominal wound healing.   Chest x-ray June 15, 2006 showed the lungs were not well aerated with  bibasilar atelectasis versus pneumonia.  Chest CT June 16, 2006 showed  no retroperitoneal hematoma although there was a large hematoma in the  pelvic and subcutaneous tissue of the lower abdomen measuring 28 x 10 x  15 cm.   The patient was evaluated by the  rehabilitation physicians and felt to  be an appropriate candidate for inpatient rehabilitation.   REVIEW OF SYSTEMS:  Positive for shortness of breath, chest pain, bowel  incontinence, urinary incontinence, wound healing, numbness and  weakness.   PAST MEDICAL HISTORY:  1. Chronic atrial fibrillation.  2. Restrictive lung disease O2 dependent.  3. Diabetes mellitus type 2 with diabetic neuropathy.  4. Abdominal surgeries including gastric plication and ventral hernia      repair.  5. History of pulmonary embolism.  6. Macular degeneration.  7. Gout.  8. Endometrial hyperplasia with occasional vaginal bleeding.  9. History of pulmonary alveolitis.  10.Exploratory laparotomy for cholecystectomy.  11.Gastric plication.  12.History of deep venous thrombosis with inferior vena cava filter.  13.Hypothyroidism with history of Graves' disease.  14.Recent small bowel obstruction.  15.Chronic urinary tract infections.  16.Stroke in 1997.  17.Non critical coronary artery disease with non ischemic      cardiomyopathy.   FAMILY HISTORY:  Positive for stroke.   SOCIAL HISTORY:  The patient lives with her son although the son works  during the day.  She lives and has been independently most recently.  She gets around mostly using a chair from a typewriter type desk.  She  is unable to maneuver in her home with a wheelchair although she does  have a wheelchair.  The wheelchair appears to be too large and she  mostly gets around using the chair for a typewriter set up.  She has  [employer]Wheels.  She smoked for approximately two years, but quit for  50 years now.  She denies alcohol usage.  She is a retired Engineer, civil (consulting).   FUNCTIONAL HISTORY PRIOR TO ADMISSION:  She used the wheelchair only  occasionally and mostly used a wheeled chair.   ALLERGIES:  GLUCOPHAGE, NSAID's.   MEDICATIONS PRIOR TO ADMISSION:  1. Coumadin 5 mg daily.  2. Hyzaar/hydrochlorothiazide 100/25 1 tablet daily.  3.  Singulair 10 mg daily.  4. Cardizem CD 300 mg daily.  5. Tylenol 1000 mg t.i.d.  6. Colchicine 0.6 mg b.i.d.  7. Proventil 1-2 puffs b.i.d.  8. Synthroid 175 mcg daily.  9. Fluoxetine 20 mg daily.  10.Benadryl 25 mg q.h.s. p.r.n.   LABORATORY DATA:  Recent hemoglobin was 8.5 with hematocrit 25.5,  platelet count 453,000.  White count was decreased to 10.9.  Recent INR  was 1.1.  C. diff was negative June 24, 2006.  Stools were negative for  Hemoccult x2.  Recent sodium 142, potassium 3.7, chloride 99, CO2 35,  BUN 12, creatinine 0.65 and glucose 152.  Recent AST was 42 with total  protein of 4.1 and albumin of 1.8.   PHYSICAL EXAMINATION:  GENERAL APPEARANCE:  Morbidly obese, adult female  lying in bed with O2 by nasal cannula.  VITAL SIGNS:  Blood pressure 119/55, pulse 82, respiratory rate 20 and  temperature 98.1.  CBGs have been in the 150 to 200 range.  HEENT:  Normocephalic, atraumatic.  CARDIOVASCULAR:  Irregular rate and rhythm.  S1, S2 without murmurs.  ABDOMEN:  Soft, obese, nontender with positive bowel sounds.  She has  slowly healing wounds of her abdomen, both on the anterolateral flanks  and the midline.  Each of these were dressed with Mepilex.  She has  serous drainage from the two midline wounds and the left lateral wound.  She also has significant ecchymosis of the left lateral flank and  beneath her breast on the left side.  CHEST:  Lungs were clear to auscultation bilaterally.  NEUROLOGIC:  Alert and oriented x3.  Cranial nerves II-XII are grossly  intact.  Bilateral upper extremity exam showed 4+/5 strength throughout.  Bulk and tone were normal.  Reflexes were 2+ and symmetrical.  Sensation  was intact to light touch throughout the bilateral upper extremities.  Lower extremity exam showed hip flexion, knee extension and ankle  dorsiflexion at 3+ to 4-/5.  Bulk was normal and tone was normal.  IMPRESSION:  1. Severe deconditioning after ventral hernia  repair with development      of subcutaneous hematoma and subsequent postoperative anemia.  2. Type 2 diabetes mellitus with peripheral neuropathy.  3. Healing wounds of the abdomen treated with Mepilex dressing.   Presently the patient has deficits in activities of daily living,  transfers and ambulation secondary to the above noted ventral hernia  repair and subsequent medical complications.   PLAN:  1. Admit to rehabilitation for daily therapies to include physical      therapy for range of motion, strengthening, bed mobility,      transfers, gait training and equipment evaluation.  2. Occupational therapy for range of motion, strengthening, activities      of daily living, cognitive/perceptual training, splinting and      equipment evaluation.  3. Rehab nursing for skin care, wound care, bowel and bladder training      as necessary.  4. Case management to asses home environment, assist with discharge      planning and arrange for appropriate follow up care.  5. Social worker to assess family and social support, and assist in      discharge planning.  6. Check admission laboratories including CBC and C-MET in a.m. Jun 26, 2006.  7. Coumadin per pharmacy.  8. Amaryl 4 mg p.o. daily.  9. Cardizem CD 120 mg p.o. daily along with Lasix 20 mg p.o. daily.  10.Ocuvite 1 tablet p.o. b.i.d.  11.Atrovent nebulizers q.i.d. p.r.n.  12.Advair 250/50 1 puff b.i.d.  13.Spiriva 18 mcg 1 puff daily.  14.Tylenol 1000 mg p.o. t.i.d.  15.Synthroid 175 mcg p.o. daily.  16.Lipitor 40 mg p.o. q.h.s. dispense as written.  17.Colchicine 0.6 mg p.o. daily.  18.Singulair 10 mg 1 tablet p.o. daily.  19.Insulin 70/30 4 units in the a.m. and 2 units in the p.m.  20.Protonix 40 mg p.o. daily.  21.Lovenox 40 mg subcu b.i.d. until INR consistently greater than 2.0      on Coumadin.  22.Xopenex nebulizer 0.63 mg t.i.d. dispense as written.  23.Lactinex 1 packet p.o. b.i.d.  24.Fibercon 2 tablets p.o.  b.i.d.  25.Send a loose stool for C. diff x2.  26.Norco 10/325 1 tablet p.o. q.4-6h. p.r.n. for pain.  27.Imodium 1 mg p.o. q.12h. p.r.n. after C. diff specimen sent as      necessary.  28.Cozaar 100 mg p.o. daily (on hold at present.)  29.Prevacid 20 mg p.o. b.i.d.  30.Oxygen 4 liters per nasal cannula.  31.Chest x-ray PA and lateral to follow bibasilar atelectasis versus      pneumonia.  32.Draw blood from IV.  33.Nystatin mouthwash 10 cc p.o. swish and spit q.i.d.  34.Routine pressure relief and turning q.2h. while in bed.  35.Iron sulfate 325 mg p.o. b.i.d. for postoperative anemia.  36.Incentive spirometer q.i.d. and flutter valve q.1-2h.  37.Wound care by [facility] consult.  38.VPC cream to the buttocks b.i.d. and p.r.n.  39.Beneprotein powder 1 scoop p.o. t.i.d. in Ensure pudding.  40.Continue Foley for time being.   PROGNOSIS:  Fair.  ESTIMATED BLOOD LOSS:  10 to 20 days.   GOALS:  Short distance ambulation at min assist and min assist for  wheelchair transfers and stand by assistance for wheelchair mobility  with modified independent upper extremity ADLs and min assist lower  extremity ADLs.           ______________________________  Ellwood Dense, M.D.     DC/MEDQ  D:  06/25/2006  T:  06/25/2006  Job:  [id number]

## 2010-07-13 NOTE — Discharge Summary (Signed)
[name], [name]              ACCOUNT NO.:  [id number]   MEDICAL RECORD NO.:  [id number]          PATIENT TYPE:  INP   LOCATION:  2018                         FACILITY:  [facility]   PHYSICIAN:  Madaline Guthrie, M.D.    DATE OF BIRTH:  [birth year]   DATE OF ADMISSION:  12/24/2004  DATE OF DISCHARGE:  12/31/2004                                 DISCHARGE SUMMARY   DISCHARGE DIAGNOSIS:  1.  Chest pain likely secondary to chronic pulmonary disease and work of      breathing.  2.  Atrial fibrillation.  3.  Hypothyroidism.  4.  Vaginal bleeding.  5.  Chronic restrictive lung disease.   DISCHARGE MEDICATIONS:  1.  Levoxyl 225 mcg p.o. daily.  2.  Lopressor 25 mg p.o. b.i.d.  3.  Lovenox 150 units subcu b.i.d. as instructed by Dr. Michaell Cowing.  4.  Coumadin as instructed by Dr. Michaell Cowing.  5.  Discontinue Cardizem.  6.  Discontinue Atenolol.  All other medicines are to be taken as prescribed by Dr. Pasty Spillers prior to  admission.   PROCEDURES:  1.  Pelvic ultrasound showed endometrial thickening to 6 mm.  It was noted      in the report that anything greater than 4 mm in a post menopausal woman      was abnormal.  Please see report for further details.  2.  2D echocardiogram showed central wall motion not visualized well      secondary to the patient's body habitus.  There was mild decreased      systolic function.  Left ventricular ejection fraction was 40-50% and      there was mild left ventricular wall thickening.  Please see report for      further details.  3.  Cardiac catheterization showed a 30-40% focal stenosis in the      nondominant circumflex artery.  There was normal left ventricular      function.  Normal renal arteries.  Systemic hypertension was noted.      Please see report for further details.   CONSULTATIONS:  Cardiology consultation was obtained from Dr. Chanda Busing.   ADMISSION HISTORY AND PHYSICAL:  [name] is a 77 year old woman  with a history including  coronary artery disease, hypertension, type 2  diabetes, atrial fibrillation, restrictive lung disease, and morbid obesity.  She presented to the [facility] clinic with complaints of chest pain that was  substernal and not radiating.  It did occur both at rest and with exertion  for the past 4-5 days.  She had received Lasix two days prior to admission  for increasing shortness of breath and leg swelling.  She reported a dry  cough with no sputum, fever, or chills.  Please see admission history and  physical for further details.   HOSPITAL COURSE:  Problem 1:  Chest pain.  The patient had additional episodes of chest pain  while hospitalized but no additional episodes four days prior to discharge.  Work of breathing improved with breathing treatments and the patient had a  decreased oxygen demand returning to her previous baseline at  home.  Cardiac  enzymes obtained on admission and the subsequent 24 hours were normal except  for an elevated troponin at 0.21.  2D echo and cardiac catheterization  showed no evidence of ischemic disease, please see the reports for further  detail.  Coumadin was held for cardiac catheterization.  The patient  received heparin drip while INR normalized.  Lovenox was provided by the  pharmacy to transition back to therapeutic INR as an outpatient.  Follow up  was arranged with Dr. Michaell Cowing and scheduled for January 03, 2005, at 2:15 p.m.   Problem 2:  Atrial fibrillation.  The patient presented with bradycardia  that responded to discontinuing her Atenolol and Cardizem.  Once the rate  normalized, Lopressor was restarted and increased to 25 mg p.o. b.i.d. at  discharge.   Problem 3:  Hypothyroidism.  The patient has known history of hypothyroidism  and was previously therapeutic on Levoxyl at 200 mcg as documented in her  outpatient chart.  TSH on admission was 22.6.  Her Levoxyl was increased to  225 mcg p.o. daily, TSH should be reassessed at her outpatient follow  up  with Dr. Pasty Spillers on February 08, 2005.   Problem 4:  Vaginal bleeding.  The patient had occasional vaginal spotting  while hospitalized.  Pelvic ultrasound showed a thickened endometrial  lining.  Outpatient follow up was arranged with Dr. Clearance Coots.  Follow up  appointment was scheduled for Monday, November 13, at 2:20 p.m.   Problem 5:  Restrictive lung disease.  Work of breathing improved with  breathing treatments and the patient's O2 requirement returned to baseline  prior to discharge.  Lung disease was stable.   DISCHARGE LABORATORY DATA:  BMP obtained on November 4 showed sodium 136,  potassium 3.8, chloride 96, bicarb 33, BUN 15, creatinine 0.9, and glucose  129.  Cardiac enzymes, her last set of three, showed troponin 0.21 which has  been stable elevated with the previous two also being 0.21, CK 64, CK MB  1.3.  A CBC obtained on November 6 showed white blood count 6.7, hemoglobin  13.5, platelet count 216.  Her PT INR were 15.2 and 1.2 respectively at  discharge.     ______________________________  Laqueta Due, M.D.      Madaline Guthrie, M.D.  Electronically Signed    AH/MEDQ  D:  12/31/2004  T:  12/31/2004  Job:  [id number]   cc:   Clare Charon, M.D.  Fax: [phone]   Charles A. Clearance Coots, M.D.  Fax: [phone]

## 2010-07-13 NOTE — Discharge Summary (Signed)
[name], [name]                          ACCOUNT NO.:  [id number]   MEDICAL RECORD NO.:  [id number]                   PATIENT TYPE:  INP   LOCATION:  5506                                 FACILITY:  [facility]   PHYSICIAN:  C. Ulyess Mort, M.D.             DATE OF BIRTH:  Dec 21, 1932   DATE OF ADMISSION:  03/03/2003  DATE OF DISCHARGE:  03/09/2003                                 DISCHARGE SUMMARY   DISCHARGE DIAGNOSES:  1. Acute exacerbation of chronic obstructive pulmonary disease.  2. A history of chronic congestive cardiac failure.  3. Chronic atrial fibrillation, on chronic Coumadin treatment.  4. History of Graves' disease status post radioiodine ablation with     resulting hypothyroidism.  5. Diabetes mellitus, type 2.  6. Hyperlipidemia.  7. Gastroesophageal reflux disease.  8. Depression.  9. Gout.  10.      Stress urinary incontinence.  11.      History of nonspecific alveolitis with mild apical changes  12.      History of pulmonary embolism (with Greenfield filter in situ).  13.      Morbid obesity (status post gastric plication surgery around 25     years ago).   DISCHARGE MEDICATIONS:  1. Digoxin 0.125 mg one p.o. daily.  2. Coumadin 5 mg p.o. q. Tuesday, Thursday, Saturday and Sunday, and 2.5 mg     p.o. q. Monday, Wednesday and Friday.  3. Synthroid 0.2 mg one p.o. daily.  4. Glipizide 10 mg one p.o. q. daily.  5. Albuterol MDI two puffs q.6h. p.r.n. for dyspnea.  6. Paxil 40 mg one p.o. daily.  7. Hyzaar 100/25 one p.o. daily.  8. Lipitor 40 mg one p.o. daily.  9. Aciphex 20 mg one p.o. q.a.c.  10.      Pulmicort one puff q.12h.  11.      Serevent 50 micrograms one puff q.12 hours.  12.      Diltiazem 180 mg one p.o. q. daily.  13.      Oxybutynin 5 mg one p.o. q. daily.  14.      Colchicine 5 mg, one p.o. q.12h. p.r.n. gout pain.   DISPOSITION:  The patient was discharged to go home and will be followed up  on March 24, 2003, at 11 a.m. at the [facility] outpatient clinic  by Dr. Dineen Kid. [name], her primary care physician.  The primary issues to  be addressed during this followup visit will be to insure proper resolution  of her acute exacerbation of chronic obstructive pulmonary disease and  general resumption of her activities of daily living.  The patient is also  to follow up with Dr. Gaspar Garbe B. Little or [facility], and she  will be contacted with the appointment date and time by the cardiology  group.   CONSULTATIONS:  Dr. Gaspar Garbe B. Little, cardiology.   PROCEDURES:  1. Chest x-ray done  on March 03, 2003, that showed cardiomegaly without     evidence for acute pulmonary abnormality.  2. Spiral CT scan of the chest done on March 04, 2003, that showed a     suboptimal pulmonary arterial opacification.  The study was essentially     nondiagnostic, and a repeat chest CT scan was recommended in about 24     hours which shorter scan delay.  3. A 2-D transthoracic echocardiogram done on March 04, 2003, that showed     overall normal left ventricular systolic function with a left ventricular     ejection fraction of 55-65%.  There was aortic regurgitation that was     graded at 1+ on a scale of 0-4+.  Mitral regurgitation was graded 1+ on a     grade of 0-4+.  The left atrium was noted to be dilated.  4. Pulmonary function tests were done and this showed no obstructive lung     defect indicated by the FEV1/ FVC ratio.  There was moderate restrictive     lung defect noted.  There was mild decrease in diffusion capacity of the     lung.  Repeat CT scan of the chest done two days prior to discharge     showed evidence of low pulmonary embolism.   HISTORY OF PRESENT ILLNESS:  This is a 77 year old Caucasian female with a  history of multiple medical problems, who presents with complaints of  progressive shortness of breath for the past two months or so.  She notes  that her shortness of breath is associated  with a nonproductive cough, and  she denies any fever or chest pain.  She however notes that she has  significant orthopnea and has to sleep in a chair.  She notes that she is  dyspneic on exertion, and gets breathless on coming here.  She notes to be  unusually fatigued all the time.   PAST MEDICAL HISTORY:  In addition to the above diagnostic list, she has a  history of cerebrovascular accident five years ago with left-sided weakness  and numbness in her foot.  She also has a history of a nonspecific  alveolitis with mild apical changes (C/ANCA negative).   MEDICATIONS PRIOR TO PRESENTATION:  1. Diltiazem.  2. Glipizide.  3. Lipitor.  4. Pulmicort.  5. Serevent.  6. Combivent.  7. Paxil.  8. Hyzaar.  9. Oxybutynin.  10.      Multivitamin.  11.      Aciphex.  12.      Colchicine.  13.      Coumadin.   SOCIAL HISTORY:  She is a former smoker.  She smoked cigarettes a long time  ago and quit.  She is married and lives with her husband at home.   REVIEW OF SYSTEMS:  She notes positive for weight grain, fatigue, sore  throat, palpitations, dyspnea, orthopnea, PND, edema and cough.  She also  notes headache, dizziness and anxiety.   PHYSICAL EXAMINATION:  VITAL SIGNS:  Pulse 75, blood pressure 150/86,  temperature 97, respiratory rate 28 per minute, oxygen saturations of 91% on  room air.  GENERAL:  She is obese, poor hygiene, and sitting in a wheelchair.  HEENT:  Eyes:  Both pupils are equally reactive to light and accommodation.  ENT:  Throat is noted to be hyperemic.  NECK:  Supple.  She has no goiter palpable.  RESPIRATORY SYSTEM:  There are coarse breath sounds and expiratory wheezes  in both lung  fields.  CARDIOVASCULAR SYSTEM:  Pulse is irregularly irregular, and heart sounds S1  and S2 are normal.  GI:  Abdomen soft, flabby, nontender, nondistended and bowel sounds are  normally. EXTREMITIES:  She has bipedal edema that is mildly pitting.  SKIN:  She has multiple  papular lesions/ pruritic scars.  NEUROLOGICAL SYSTEM:  Cranial nerve exam is normal.  Her general sensory/  motor exam is nonfocal.   LABORATORY DATA:  Sodium 138, potassium 3.6, chloride 100, bicarbonate 29,  BUN 17, creatinine 0.9, and glucose of 109.  Hemoglobin is at 13.1, white  cell count 8.8 and platelets of 283,000.  Bilirubin is at 0.6, alkaline  phosphatase 124, SGPT 18, SGOT 23, protein 7.7, and albumin of 3.7 and  calcium 8.9.  Prothrombin time is 23 seconds (INR of 2.8).  Brain naturetic  peptide is at 287.  TSH level is 3.45.  Urinalysis is positive for nitrites,  trace ketones and small amounts of leukocytes.   HOSPITAL COURSE:  1. Shortness of breath.  Several broad-spectrum differential diagnoses were     entertained, and each was subsequently ruled out.  At first we thought     that this lady merely had an exacerbation of her congestive cardiac     failure and as such, we got a brain naturetic peptide.  It is noted that     the value of this was within the normal range, and we got further input     from a cardiology consultation.  The cardiologist concurred that this was     not a case of congestive cardiac failure and would not have to be treated     as such.  We further went out and rule out any acute myocardial     infarction that may have occurred in this lady by screening her with the     traditional set of cardiac markers.  All three sets obtained were within     the normal range, and did not reveal any acute myocardial injury.  As     part of the comprehensive workup, we went ahead and also ruled out the     possibility of a pulmonary embolus.  We thought that the main modality of     investigating this in a patient of such complexity would be to directly     pursue a spiral CT scan of the chest. CT scan of the chest which had to     be done twice revealed no pulmonary embolism.  We did recognize that this     patient had been chronically on Coumadin and had a  Greenfield filter in     situ, so the eventuality that she may have had a pulmonary embolus would     have had no impact on our management as such, but would have just been     contributory to our diagnostic workup.  We also ruled out the possibility     of a pneumonic process by getting a chest x-ray, and this revealed no     acute infective process.  We, however, noted that she may have had simple     exacerbation of a chronic obstructive pulmonary disease with underlying     alveolitis of unknown etiology.  We started this patient on inhaled     steroids as well as inhaled bronchodilator treatment on a more rapid     frequency than she was used to at home.  This insured that the patient  received more bronchodilator treatment in the earlier phase of her     hospitalization, and was tapered out to maintain as frequency she would    be taking at home.  Together with this, we also consulted physical     therapy who insured good ambulation for this patient in recovery, not     only of her physical state, but also of her chronic obstructive pulmonary     disease.  2. Chronic atrial fibrillation.  We from the get go had consulted Dr. Clarene Duke     of [facility], and he was actively involved in this     patient's care.  The patient had good response to her Diltiazem     treatment, and was continued on this treatment with gradual tapering of     the dose as she showed periods of breakthrough tachycardia during her     hospitalization stay. Her final Diltiazem dose was designed after several     hydrations, and she will be followed up by Dr. Gaspar Garbe B. Little at     followup.   The patient has also been on chronic Coumadin therapy, and her INR during  her hospitalization stay was noted to be therapeutic throughout the entire  stay in the hospital.  She will be continued on the same Coumadin regime  that she has been on priorly, and will be followed by Dr. __________at the  [facility] outpatient clinic.   1. Hyperlipidemia.  We did a fasting lipid panel in this patient that showed     cholesterol of 126, triglycerides of 120, HDL 37, and LDL of 65.  It is     noted that she has been priorly on static medications, and she will be     continued on the same.  She is on Lipitor 40 mg once a day, and she is     showing good response to this with minimal hepatotoxicity.  2. Hypothyroidism.  She will be continued on her Synthroid as her TSH level     and symptoms are pretty well controlled.  3. Gastroesophageal reflux disease.  The patient will be continued on     Aciphex which has shown good response.      Zetta Bills, MD                             Gary Fleet, M.D.    JP/MEDQ  D:  04/07/2003  T:  04/08/2003  Job:  [id number]   cc:   Dineen Kid. [name], M.D.  [address]  [place] [address]  Fax: [phone]   Thereasa Solo. Little, M.D.  [address]  [address]  [place]  [place] [address]  Fax: [phone]

## 2010-07-13 NOTE — Consult Note (Signed)
NAMESUAN, PYEATT                          ACCOUNT NO.:  [id number]   MEDICAL RECORD NO.:  [id number]                   PATIENT TYPE:  REC   LOCATION:  FOOT                                 FACILITY:  [facility]   PHYSICIAN:  Jonelle Sports. Sevier, MD                DATE OF BIRTH:  16-Jun-1932   DATE OF CONSULTATION:  10/29/2001  DATE OF DISCHARGE:                                   CONSULTATION   HISTORY OF PRESENT ILLNESS:  This 77 year old white female retired [employer]  was seen at the courtesy of Dr. [name] for evaluation of chronic numbness,  burning and aching in the feet with walking and with ingrown nails of both  hallices.   The patient has been diagnosed with type 2 diabetes some six months ago and  has been morbidly obese most of her adult life. She has had gastric stapling  in the past and at that time lost dramatically but over the years has  regained it and currently weighs in excess of 325 pounds. Her foot symptoms  are clearly associated with weight and weightbearing and found more  typically related to that and perhaps some degree of destructive arthritis  rather than to true neuropathic symptoms. She is here now for our evaluation  and advice.   PAST MEDICAL HISTORY:  Notable for two prior cerebrovascular accidents and  myocardial infarction, essential hypertension, macular degeneration and  interstitial cystitis. She is allergic to VIOXX.   MEDICATIONS:  Her regular medications are legion in number including:  1. Cardizem CD.  2. Levoxyl.  3. Clonidine.  4. Hyzaar.  5. Aciphex.  6. Colchicine.  7. OxyContin.  8. Lescol.  9. Paxil.  10.      Tylenol.  11.      Aspirin.  12.      Senokot.  13.      Glipizide.  14.      Calcium carbonate and multiple other vitamins in the like.  15.      Albuterol.  16.      Bupropion.  17.      Budesonide.  18.      Serevent.  19.      Proventil.  20.      __________ for her respiratory problems.   EXAMINATION:  Examination  today is limited to the distal lower extremities.  The feet are free of edema but the lower legs and dorsal of the feet showed  scattered excoriated areas secondary to apparent insect bites. None of these  appear significantly affected, several have small eschars on them to include  one on the dorsal aspect of the left foot. The tarsals in the feet are  palpable and adequate. Skin temperatures are essentially normal. There is  slightly more warmth on the dorsal aspect on the left foot than on the  right. There is no clinical evidence of cellulitis or other acute  problems.  There is minimal callus formation at the heels bilaterally. The toes show  slight claw deformity and there is ingrowth at both margins of the nails of  both hallices. Monofilament testing shows a lack of protective sensation  throughout both feet.   DISPOSITION:  1. The matter of urgent late reduction is discussed with the patient and she     is advised to continue working with [facility] and specifically perhaps to consider entering their Optifast     program.  2. Arrangements are made for her to be reappointed here on a regular clinic     day with adequate time provided to undergo removal of the nails of both     hallices.  3. She was given a prescription for custom diabetic inserts with added     pressure release in hopes of helping her feet symptomatically.  4. A follow-up visit will be to this clinic on a p.r.n. basis.                                               Jonelle Sports. Cheryll Cockayne, MD    RES/MEDQ  D:  10/29/2001  T:  10/29/2001  Job:  [id number]   cc:   Dineen Kid. [name], M.D.

## 2010-07-14 DIAGNOSIS — G4733 Obstructive sleep apnea (adult) (pediatric): Secondary | ICD-10-CM

## 2010-07-14 NOTE — Procedures (Signed)
[name], [name]              ACCOUNT NO.:  [id number]  MEDICAL RECORD NO.:  [id number]          PATIENT TYPE:  OUT  LOCATION:  SLEEP CENTER                 [facility]ACILITY[facility]  [facility]  PHYSICIAN:  Ostin Mathey D. Maple Hudson, MD, FCCP, FACPDATE OF BIRTH:  13-Aug-1932  DATE OF STUDY:  07/11/2010                           NOCTURNAL POLYSOMNOGRAM  REFERRING PHYSICIAN:  PATRICK TOBBIA  INDICATIONS FOR STUDY:  Insomnia with sleep apnea.  EPWORTH SLEEPINESS SCORE:  11/24, BMI 57.4.  Weight 304 pounds, height 61 inches.  Neck 15 inches.  HOME MEDICATIONS:  Charted and reviewed.  SLEEP ARCHITECTURE:  Total sleep time 353.5 minutes with sleep efficiency 87%.  Stage I was 4.1%, stage II 74.8%, stage III absent, REM 21.1% of total sleep time.  Sleep latency 12.5 minutes, REM latency 199.5 minutes, awake after sleep onset 40.5 minutes, arousal index 13.9.  BEDTIME MEDICATION:  Lunesta.  RESPIRATORY DATA:  Apnea/hypopnea index (AHI) 6.1 per hour.  A total of 36 events was scored including 30 obstructive apneas, 4 central apneas, 2 hypopneas.  Events were recorded as nonsupine.  REM AHI 20.1.  A 44 respiratory effort related arousals were counted for an RDI of 13.6 per hour.  There were insufficient early events to permit application of CPAP titration by split protocol on the study night.  OXYGEN DATA:  Mild-to-moderate snoring with oxygen desaturation to a nadir of 94% and a mean oxygen saturation through the study of 97.9%. The patient was wearing 2.5 liters of supplemental oxygen throughout the study per physician order.  CARDIAC DATA:  Rhythm appears to be atrial fibrillation with paced control.  MOVEMENTS/PARASOMNIA:  Frequent limb jerks especially recorded between 11:00 p.m. and 1:00 a.m.  A total of 272 limb jerks were counted of which 7 were associated with recognized arousal or awakening for periodic limb movement with arousal index of 1.2 per hour.  No  bathroom trips.  IMPRESSION/RECOMMENDATIONS: 1. Sleep architecture was not remarkable for sleep center environment     after Lunesta taken at 9:00 p.m.  A sustained interval of limb     jerks were recorded between 11:00 p.m. and 1:00 a.m.  While this     was associated with an arousal index of only 1.2 per hour, it may     contribute to the patient's perception of difficulty initiating     sleep in the home environment. 2. Mild obstructive sleep apnea/hypopnea syndrome, AHI 6.1 per hour.     Events were mainly associated with nonsupine position and REM (REM     AHI 20.1 per hour).  Mild-to-moderate snoring with oxygen     desaturation to a nadir of 94% and a mean saturation through the     study of 97.9%.  The patient was wearing supplemental oxygen     through the study at 2.5 liters per minute as ordered. 3. She did not have enough early events to permit application of CPAP     titration by split protocol.  If conservative measures are not     helpful and active intervention is felt necessary, then consider     return for dedicated CPAP titration study night.     Udell Blasingame D. Maple Hudson, MD,  FCCP, FACP Diplomate, [employer] Electronically Signed    CDY/MEDQ  D:  07/14/2010 10:14:41  T:  07/14/2010 22:53:07  Job:  [id number]

## 2010-07-20 ENCOUNTER — Other Ambulatory Visit: Payer: Self-pay | Admitting: *Deleted

## 2010-07-20 DIAGNOSIS — G4700 Insomnia, unspecified: Secondary | ICD-10-CM

## 2010-07-20 MED ORDER — ESZOPICLONE 1 MG PO TABS: 1.0000 mg | ORAL_TABLET | Freq: Every day | ORAL | Status: DC

## 2010-07-24 NOTE — Telephone Encounter (Signed)
Pharmacy had refills for this med, the paper sent was for a PA

## 2010-07-27 ENCOUNTER — Ambulatory Visit: Payer: Medicare Other | Admitting: Internal Medicine

## 2010-08-05 ENCOUNTER — Other Ambulatory Visit (INDEPENDENT_AMBULATORY_CARE_PROVIDER_SITE_OTHER): Payer: Medicare Other | Admitting: Internal Medicine

## 2010-08-05 DIAGNOSIS — I509 Heart failure, unspecified: Secondary | ICD-10-CM

## 2010-08-06 ENCOUNTER — Encounter: Payer: Self-pay | Admitting: Internal Medicine

## 2010-08-27 ENCOUNTER — Other Ambulatory Visit: Payer: Self-pay | Admitting: Internal Medicine

## 2010-08-27 ENCOUNTER — Other Ambulatory Visit: Payer: Medicare Other

## 2010-08-27 ENCOUNTER — Encounter: Payer: Self-pay | Admitting: *Deleted

## 2010-08-27 ENCOUNTER — Ambulatory Visit (INDEPENDENT_AMBULATORY_CARE_PROVIDER_SITE_OTHER): Payer: Medicare Other | Admitting: Pharmacist

## 2010-08-27 DIAGNOSIS — Z7901 Long term (current) use of anticoagulants: Secondary | ICD-10-CM

## 2010-08-27 DIAGNOSIS — R300 Dysuria: Secondary | ICD-10-CM

## 2010-08-27 DIAGNOSIS — I4891 Unspecified atrial fibrillation: Secondary | ICD-10-CM

## 2010-08-27 LAB — POCT INR: INR: 1.9

## 2010-08-27 NOTE — Progress Notes (Signed)
Patient does not have any other symptoms. She can only stay for the next 10 minutes. We will get a urine dip stick and a culture and treat her accordingly.

## 2010-08-27 NOTE — Progress Notes (Unsigned)
Pt was being seen be Dr Alexandria Lodge and stopped at triage with c/o dysuria for 2 weeks.   Denies fever or other pain. We have no openings today.  Should we get a Urine sample and have her wait to see if there is a no show?

## 2010-08-27 NOTE — Patient Instructions (Signed)
Patient instructed to take medications as defined in the Anti-coagulation Track section of this encounter.  Patient instructed to take today's dose.  Patient verbalized understanding of these instructions.    

## 2010-08-27 NOTE — Progress Notes (Signed)
See my note on patient.

## 2010-08-27 NOTE — Progress Notes (Signed)
Anti-Coagulation Progress Note  [name] is a 77 y.o. female who is currently on an anti-coagulation regimen.    RECENT RESULTS: Recent results are below, the most recent result is correlated with a dose of 25 mg. per week: Lab Results  Component Value Date   INR 1.9 08/27/2010   INR 3.0 06/18/2010   INR 3.4 05/28/2010    ANTI-COAG DOSE:   Latest dosing instructions   Total Sun Mon Tue Wed Thu Fri Sat   30 2.5 mg 5 mg 5 mg 5 mg 2.5 mg 5 mg 5 mg    (5 mg0.5) (5 mg1) (5 mg1) (5 mg1) (5 mg0.5) (5 mg1) (5 mg1)         ANTICOAG SUMMARY: Anticoagulation Episode Summary              Current INR goal 2.0-3.0 Next INR check 07/02/2010   INR from last check 1.9! (08/27/2010)     Weekly max dose (mg)  Target end date Indefinite   Indications Long-term (current) use of anticoagulants, AF (atrial fibrillation)   INR check location  Preferred lab    Send INR reminders to ANTICOAG IMP   Comments        Provider Role Specialty Phone number   Ulyess Mort  Internal Medicine [phone]        ANTICOAG TODAY: Anticoagulation Summary as of 08/27/2010              INR goal 2.0-3.0     Selected INR 1.9! (08/27/2010) Next INR check    Weekly max dose (mg)  Target end date Indefinite   Indications Long-term (current) use of anticoagulants, AF (atrial fibrillation)    Anticoagulation Episode Summary              INR check location  Preferred lab    Send INR reminders to ANTICOAG IMP   Comments        Provider Role Specialty Phone number   Ulyess Mort  Internal Medicine [phone]        PATIENT INSTRUCTIONS: Patient Instructions  Patient instructed to take medications as defined in the Anti-coagulation Track section of this encounter.  Patient instructed to take today's dose.  Patient verbalized understanding of these instructions.        FOLLOW-UP Return in 3 weeks (on 09/17/2010) for Follow up INR.  Hulen Luster, III Pharm.D., CACP

## 2010-08-30 ENCOUNTER — Other Ambulatory Visit: Payer: Medicare Other

## 2010-08-30 ENCOUNTER — Other Ambulatory Visit: Payer: Self-pay | Admitting: Internal Medicine

## 2010-08-30 DIAGNOSIS — R300 Dysuria: Secondary | ICD-10-CM

## 2010-08-30 LAB — URINALYSIS, ROUTINE W REFLEX MICROSCOPIC
Bilirubin Urine: NEGATIVE
Glucose, UA: NEGATIVE mg/dL
Ketones, ur: NEGATIVE mg/dL
pH: 6.5 (ref 5.0–8.0)

## 2010-08-30 LAB — URINALYSIS, MICROSCOPIC ONLY

## 2010-08-30 MED ORDER — CIPROFLOXACIN HCL 250 MG PO TABS: 250.0000 mg | ORAL_TABLET | Freq: Two times a day (BID) | ORAL | Status: DC

## 2010-08-30 NOTE — Progress Notes (Signed)
Addended by: Remus Blake on: 08/30/2010 10:40 AM   Modules accepted: Orders

## 2010-08-30 NOTE — Progress Notes (Signed)
Meds called in by Dr Coralee Pesa and Dr Alexandria Lodge informed per Dr Coralee Pesa

## 2010-09-03 ENCOUNTER — Ambulatory Visit (INDEPENDENT_AMBULATORY_CARE_PROVIDER_SITE_OTHER): Payer: Medicare Other | Admitting: Internal Medicine

## 2010-09-03 ENCOUNTER — Encounter: Payer: Self-pay | Admitting: Internal Medicine

## 2010-09-03 ENCOUNTER — Encounter: Payer: Self-pay | Admitting: *Deleted

## 2010-09-03 VITALS — BP 132/72 | HR 83 | Temp 98.5°F | Ht 62.0 in

## 2010-09-03 DIAGNOSIS — N390 Urinary tract infection, site not specified: Secondary | ICD-10-CM

## 2010-09-03 DIAGNOSIS — E119 Type 2 diabetes mellitus without complications: Secondary | ICD-10-CM

## 2010-09-03 DIAGNOSIS — Z87440 Personal history of urinary (tract) infections: Secondary | ICD-10-CM

## 2010-09-03 DIAGNOSIS — I10 Essential (primary) hypertension: Secondary | ICD-10-CM

## 2010-09-03 LAB — POCT GLYCOSYLATED HEMOGLOBIN (HGB A1C): Hemoglobin A1C: 7.4

## 2010-09-03 MED ORDER — NITROFURANTOIN MONOHYD MACRO 100 MG PO CAPS: ORAL_CAPSULE | ORAL | Status: DC

## 2010-09-03 NOTE — Patient Instructions (Signed)
Please schedule a follow up appointment with Dr. Aundria Rud in 1 month. Please take your medicines as prescribed. Please call the clinic if you develop new rash, worsening of your itching, SOB, chest pain or fever.

## 2010-09-03 NOTE — Progress Notes (Signed)
Pt called and asked to come into clinic for f/u of UTI and results of culture.  Per request of Dr Meredith Pel. Pt called and is not able to get here until Thursday July 12th.  She has limited times for transportation. Pt denies fever, and any urinary problems today but does have Chills on the off.  She states the A/C is  The cause of that.  Pt asked to go to ED for evaluation because she is having chills but is unable to get there. Pt will keep appointment on Thursday but if any fever or problems before then will go to ED.

## 2010-09-03 NOTE — Progress Notes (Signed)
  Subjective:    Patient ID: [name], female    DOB: May 06, 1932, 77 y.o.   MRN: [id number]  HPI: 77 year old woman with past medical history significant for COPD on home oxygen, type 2 diabetes mellitus, hypertension, recurrent UTIs comes to the clinic with chief complaint of chills.  She was called in by triage nurse for an appointment today.  Patient was seen by Dr. Alexandria Lodge on 7/2 for her Coumadin dose adjustment  and she complained of some dysuria to the triage nurse at that time. The patient could not wait because of some transportation issues - so she was not evaluated by a physician that day, although urine specimen was obtained. Her dipstick did show pyuria and cultures grew Escherichia coli that was intermediately sensitive to nitrofurantoin( only oral antibiotic) and some injectables medications. Patient was empirically treated with ciprofloxacin for her symptomatic UTI but the sensitivity did show resistance to ciprofloxacin when the final results are back and the patient was called in for an appointment.  At today's visit, she complained of some occasional chills but denies any fever or abdominal pain or dysuria or urgency or frequency. Of note she has chronic urinary incontinence and changes pads every 3 hours.     Review of Systems  Constitutional: Positive for chills. Negative for fever, diaphoresis, activity change, appetite change, fatigue and unexpected weight change.  HENT: Negative for nosebleeds, congestion, rhinorrhea, sneezing and postnasal drip.   Respiratory: Negative for apnea, cough, choking, chest tightness, shortness of breath and wheezing.   Cardiovascular: Negative for chest pain, palpitations and leg swelling.  Gastrointestinal: Negative for abdominal distention.  Genitourinary: Negative for dysuria, urgency, hematuria, flank pain and difficulty urinating.  Musculoskeletal: Negative for arthralgias.  Neurological: Negative for light-headedness and headaches.    Hematological: Negative for adenopathy.       Objective:   Physical Exam  Constitutional: She is oriented to person, place, and time. She appears well-developed and well-nourished. No distress.  HENT:  Head: Normocephalic and atraumatic.  Right Ear: External ear normal.  Left Ear: External ear normal.  Eyes: Conjunctivae and EOM are normal. Pupils are equal, round, and reactive to light. Right eye exhibits no discharge. Left eye exhibits no discharge.  Neck: Normal range of motion. Neck supple. No JVD present. No tracheal deviation present. No thyromegaly present.  Cardiovascular: Normal rate, regular rhythm, normal heart sounds and intact distal pulses.  Exam reveals no gallop and no friction rub.   No murmur heard. Pulmonary/Chest: Effort normal and breath sounds normal. No stridor. No respiratory distress. She has no wheezes. She has no rales. She exhibits no tenderness.  Abdominal: Soft. Bowel sounds are normal. She exhibits no distension and no mass. There is no tenderness. There is no rebound and no guarding.  Musculoskeletal: Normal range of motion.  Lymphadenopathy:    She has no cervical adenopathy.  Neurological: She is alert and oriented to person, place, and time. She displays normal reflexes. No cranial nerve deficit. She exhibits normal muscle tone. Coordination normal.  Skin: She is not diaphoretic.          Assessment & Plan:

## 2010-09-03 NOTE — Assessment & Plan Note (Signed)
From [facility] , her diabetes appear well controlled. Will continue with current treatment plan.  Lab Results  Component Value Date   HGBA1C 7.4 09/03/2010   HGBA1C 7.0 01/01/2010   CREATININE 1.35* 06/18/2010   CREATININE 1.18 05/08/2010   MICROALBUR 1.46 05/20/2009   MICRALBCREAT 48.0* 05/20/2009   CHOL  Value: 162        ATP III CLASSIFICATION:  <200     mg/dL   Desirable  161-096  mg/dL   Borderline High  >=045    mg/dL   High        06/03/8117   HDL 39* 05/08/2010   TRIG 145 05/08/2010

## 2010-09-03 NOTE — Progress Notes (Signed)
Pt being seen in office today.

## 2010-09-03 NOTE — Assessment & Plan Note (Addendum)
This is her third symptomatic UTI in last 8 months( 11/11 , 2/12 and 7/12)  with same organism (E.coli) and almost same sensitivity pattern (sensitive to some IV antibiotics and intermediate sensitivity to nitrofurantoin ).  Will treat her with nitrofurantoin as a prophylaxis for recurrent UTIs. Although the current guidelines say 6 months but will give her a trial for a month and reassess.  Pneumonitis or pulmonary fibrosis is the long-term side effects associated with nitrofurantoin and given her COPD, home O2 dependence- we need to be extra- cautious  with that. Nitrofurantoin has been listed as allergy in our system as apparently it did cause her some itching when it was given back in February of 2012. Patient doesn't recall any allergies to nitrofurantoin and states that her itching is chronic and has been there for more than a year now.   She was informed to call clinic or 911 if she notices new onset rash worsening of itching, chest pain or shortness of breath. Patient voices the understanding.

## 2010-09-03 NOTE — Assessment & Plan Note (Signed)
BP well controlled. Will continue current regimen.   Lab Results  Component Value Date   NA 137 06/18/2010   K 4.7 06/18/2010   CL 97 06/18/2010   CO2 26 06/18/2010   BUN 48* 06/18/2010   CREATININE 1.35* 06/18/2010   CREATININE 1.18 05/08/2010    BP Readings from Last 3 Encounters:  09/03/10 132/72  06/18/10 133/64  05/23/10 120/67

## 2010-09-03 NOTE — Progress Notes (Signed)
Pt returned call and will come into clinic today to be seen by Dr Dorthula Rue.

## 2010-09-06 ENCOUNTER — Encounter: Payer: Medicare Other | Admitting: Internal Medicine

## 2010-09-17 ENCOUNTER — Ambulatory Visit (INDEPENDENT_AMBULATORY_CARE_PROVIDER_SITE_OTHER): Payer: Medicare Other | Admitting: Pharmacist

## 2010-09-17 DIAGNOSIS — I4891 Unspecified atrial fibrillation: Secondary | ICD-10-CM

## 2010-09-17 DIAGNOSIS — Z7901 Long term (current) use of anticoagulants: Secondary | ICD-10-CM

## 2010-09-17 NOTE — Progress Notes (Signed)
Anti-Coagulation Progress Note  [name] is a 77 y.o. female who is currently on an anti-coagulation regimen.    RECENT RESULTS: Recent results are below, the most recent result is correlated with a dose of 30 mg. per week: Lab Results  Component Value Date   INR 2.40 09/17/2010   INR 1.9 08/27/2010   INR 3.0 06/18/2010    ANTI-COAG DOSE:   Latest dosing instructions   Total Sun Mon Tue Wed Thu Fri Sat   27.5 2.5 mg 5 mg 5 mg 5 mg 2.5 mg 5 mg 2.5 mg    (5 mg0.5) (5 mg1) (5 mg1) (5 mg1) (5 mg0.5) (5 mg1) (5 mg0.5)         ANTICOAG SUMMARY: Anticoagulation Episode Summary              Current INR goal 2.0-3.0 Next INR check 10/15/2010   INR from last check 2.40 (09/17/2010)     Weekly max dose (mg)  Target end date Indefinite   Indications Long-term (current) use of anticoagulants, AF (atrial fibrillation)   INR check location  Preferred lab    Send INR reminders to [facility]   Comments        Provider Role Specialty Phone number   Ulyess Mort  Internal Medicine [phone]        ANTICOAG TODAY: Anticoagulation Summary as of 09/17/2010              INR goal 2.0-3.0     Selected INR 2.40 (09/17/2010) Next INR check 10/15/2010   Weekly max dose (mg)  Target end date Indefinite   Indications Long-term (current) use of anticoagulants, AF (atrial fibrillation)    Anticoagulation Episode Summary              INR check location  Preferred lab    Send INR reminders to [facility]   Comments        Provider Role Specialty Phone number   Ulyess Mort  Internal Medicine [phone]        PATIENT INSTRUCTIONS: Patient Instructions  Patient instructed to take medications as defined in the Anti-coagulation Track section of this encounter.  Patient instructed to take today's dose.  Patient verbalized understanding of these instructions.        FOLLOW-UP Return in 4 weeks (on 10/15/2010) for Follow up INR.  Hulen Luster, III Pharm.D., CACP

## 2010-09-17 NOTE — Patient Instructions (Signed)
Patient instructed to take medications as defined in the Anti-coagulation Track section of this encounter.  Patient instructed to take today's dose.  Patient verbalized understanding of these instructions.    

## 2010-09-18 ENCOUNTER — Other Ambulatory Visit: Payer: Self-pay | Admitting: *Deleted

## 2010-09-20 MED ORDER — SPIRONOLACTONE 25 MG PO TABS: 25.0000 mg | ORAL_TABLET | Freq: Every day | ORAL | Status: DC

## 2010-09-30 ENCOUNTER — Other Ambulatory Visit: Payer: Self-pay | Admitting: Internal Medicine

## 2010-10-01 ENCOUNTER — Other Ambulatory Visit: Payer: Self-pay | Admitting: *Deleted

## 2010-10-01 DIAGNOSIS — Z87440 Personal history of urinary (tract) infections: Secondary | ICD-10-CM

## 2010-10-02 ENCOUNTER — Emergency Department (HOSPITAL_COMMUNITY): Payer: Medicare Other

## 2010-10-02 ENCOUNTER — Encounter (INDEPENDENT_AMBULATORY_CARE_PROVIDER_SITE_OTHER): Payer: Medicare Other | Admitting: Ophthalmology

## 2010-10-02 ENCOUNTER — Emergency Department (HOSPITAL_COMMUNITY)
Admission: EM | Admit: 2010-10-02 | Discharge: 2010-10-03 | Disposition: A | Discharge status: Home / Self Care | Payer: Medicare Other | Attending: Emergency Medicine | Admitting: Emergency Medicine

## 2010-10-02 DIAGNOSIS — E785 Hyperlipidemia, unspecified: Secondary | ICD-10-CM | POA: Insufficient documentation

## 2010-10-02 DIAGNOSIS — I252 Old myocardial infarction: Secondary | ICD-10-CM | POA: Insufficient documentation

## 2010-10-02 DIAGNOSIS — F29 Unspecified psychosis not due to a substance or known physiological condition: Secondary | ICD-10-CM | POA: Insufficient documentation

## 2010-10-02 DIAGNOSIS — R0602 Shortness of breath: Secondary | ICD-10-CM | POA: Insufficient documentation

## 2010-10-02 DIAGNOSIS — F411 Generalized anxiety disorder: Secondary | ICD-10-CM | POA: Insufficient documentation

## 2010-10-02 DIAGNOSIS — E669 Obesity, unspecified: Secondary | ICD-10-CM | POA: Insufficient documentation

## 2010-10-02 DIAGNOSIS — R4182 Altered mental status, unspecified: Secondary | ICD-10-CM | POA: Insufficient documentation

## 2010-10-02 DIAGNOSIS — N390 Urinary tract infection, site not specified: Secondary | ICD-10-CM | POA: Insufficient documentation

## 2010-10-02 DIAGNOSIS — I2510 Atherosclerotic heart disease of native coronary artery without angina pectoris: Secondary | ICD-10-CM | POA: Insufficient documentation

## 2010-10-02 DIAGNOSIS — Z7901 Long term (current) use of anticoagulants: Secondary | ICD-10-CM | POA: Insufficient documentation

## 2010-10-02 DIAGNOSIS — J449 Chronic obstructive pulmonary disease, unspecified: Secondary | ICD-10-CM | POA: Insufficient documentation

## 2010-10-02 DIAGNOSIS — I4891 Unspecified atrial fibrillation: Secondary | ICD-10-CM | POA: Insufficient documentation

## 2010-10-02 DIAGNOSIS — R059 Cough, unspecified: Secondary | ICD-10-CM | POA: Insufficient documentation

## 2010-10-02 DIAGNOSIS — E039 Hypothyroidism, unspecified: Secondary | ICD-10-CM | POA: Insufficient documentation

## 2010-10-02 DIAGNOSIS — I509 Heart failure, unspecified: Secondary | ICD-10-CM | POA: Insufficient documentation

## 2010-10-02 DIAGNOSIS — K5900 Constipation, unspecified: Secondary | ICD-10-CM | POA: Insufficient documentation

## 2010-10-02 DIAGNOSIS — R413 Other amnesia: Secondary | ICD-10-CM | POA: Insufficient documentation

## 2010-10-02 DIAGNOSIS — R05 Cough: Secondary | ICD-10-CM | POA: Insufficient documentation

## 2010-10-02 DIAGNOSIS — I10 Essential (primary) hypertension: Secondary | ICD-10-CM | POA: Insufficient documentation

## 2010-10-02 DIAGNOSIS — R3915 Urgency of urination: Secondary | ICD-10-CM | POA: Insufficient documentation

## 2010-10-02 DIAGNOSIS — Z79899 Other long term (current) drug therapy: Secondary | ICD-10-CM | POA: Insufficient documentation

## 2010-10-02 DIAGNOSIS — Z8673 Personal history of transient ischemic attack (TIA), and cerebral infarction without residual deficits: Secondary | ICD-10-CM | POA: Insufficient documentation

## 2010-10-02 DIAGNOSIS — E119 Type 2 diabetes mellitus without complications: Secondary | ICD-10-CM | POA: Insufficient documentation

## 2010-10-02 DIAGNOSIS — K219 Gastro-esophageal reflux disease without esophagitis: Secondary | ICD-10-CM | POA: Insufficient documentation

## 2010-10-02 DIAGNOSIS — J4489 Other specified chronic obstructive pulmonary disease: Secondary | ICD-10-CM | POA: Insufficient documentation

## 2010-10-02 LAB — COMPREHENSIVE METABOLIC PANEL
ALT: 21 U/L (ref 0–35)
Calcium: 10.6 mg/dL — ABNORMAL HIGH (ref 8.4–10.5)
Creatinine, Ser: 1.12 mg/dL — ABNORMAL HIGH (ref 0.50–1.10)
GFR calc Af Amer: 57 mL/min — ABNORMAL LOW (ref >60)
GFR calc non Af Amer: 47 mL/min — ABNORMAL LOW (ref >60)
Glucose, Bld: 215 mg/dL — ABNORMAL HIGH (ref 70–99)
Sodium: 135 mEq/L (ref 135–145)
Total Protein: 7.2 g/dL (ref 6.0–8.3)

## 2010-10-02 LAB — URINALYSIS, ROUTINE W REFLEX MICROSCOPIC
Nitrite: POSITIVE — AB
Specific Gravity, Urine: 1.022 (ref 1.005–1.030)
Urobilinogen, UA: 0.2 mg/dL (ref 0.0–1.0)
pH: 5 (ref 5.0–8.0)

## 2010-10-02 LAB — URINE MICROSCOPIC-ADD ON

## 2010-10-02 LAB — CBC
HCT: 42.7 % (ref 36.0–46.0)
MCH: 31.1 pg (ref 26.0–34.0)
MCHC: 33.5 g/dL (ref 30.0–36.0)
RDW: 13.9 % (ref 11.5–15.5)

## 2010-10-02 NOTE — Telephone Encounter (Signed)
Dr. Dorthula Rue on 09/03/10.

## 2010-10-02 NOTE — Telephone Encounter (Signed)
Who prescribed this?

## 2010-10-03 ENCOUNTER — Telehealth: Payer: Self-pay | Admitting: *Deleted

## 2010-10-03 ENCOUNTER — Ambulatory Visit (INDEPENDENT_AMBULATORY_CARE_PROVIDER_SITE_OTHER): Payer: Medicare Other | Admitting: Internal Medicine

## 2010-10-03 ENCOUNTER — Encounter (HOSPITAL_COMMUNITY): Payer: Self-pay | Admitting: Radiology

## 2010-10-03 VITALS — BP 129/64 | HR 63 | Temp 98.0°F

## 2010-10-03 DIAGNOSIS — E119 Type 2 diabetes mellitus without complications: Secondary | ICD-10-CM

## 2010-10-03 DIAGNOSIS — N390 Urinary tract infection, site not specified: Secondary | ICD-10-CM

## 2010-10-03 MED ORDER — CEFPODOXIME PROXETIL 200 MG PO TABS: 200.0000 mg | ORAL_TABLET | Freq: Two times a day (BID) | ORAL | Status: AC

## 2010-10-03 MED ORDER — NITROFURANTOIN MONOHYD MACRO 100 MG PO CAPS: ORAL_CAPSULE | ORAL | Status: DC

## 2010-10-03 NOTE — Progress Notes (Addendum)
  Subjective:    Patient ID: [name], female    DOB: 1932/02/26, 77 y.o.   MRN: [id number]  HPI [name] is a 77 year old woman  with the past medical history significant for type 2 diabetes mellitus, hyperlipidemia, hypertension , recurrent UTI's, COPD on 3 litre's of home oxygen who comes to the clinic as ER followup for UTI.  Patient was seen in the ER yesterday on 10/02/2010 for confusion which was likely thought from her UTI. Patient was given a prescription for cefodoxime x 10 days but she did not start her meds. She states that ''I just don't feel right today. Coming to the hospital takes all my strength away''. She states  mixing her days and nights yesterday when she came to the ER but today she feels fine. She reports intermittent burning. Denies any suprapubic discomfort urgency or frequency. Also denies any fever, chills, nausea or vomiting.    Review of Systems  Constitutional: Negative for fever, chills, activity change, appetite change and fatigue.  HENT: Negative for nosebleeds, congestion, rhinorrhea, sneezing and postnasal drip.   Respiratory: Positive for shortness of breath. Negative for cough, choking and chest tightness.   Cardiovascular: Negative for chest pain, palpitations and leg swelling.  Gastrointestinal: Negative for abdominal pain.  Genitourinary: Positive for dysuria. Negative for urgency, frequency, flank pain, decreased urine volume and pelvic pain.  Musculoskeletal: Negative for arthralgias.  Neurological: Negative for syncope, light-headedness, numbness and headaches.  Hematological: Negative for adenopathy.  Psychiatric/Behavioral: Positive for agitation.       Objective:   Physical Exam  Constitutional: She is oriented to person, place, and time. She appears well-developed and well-nourished. No distress.  HENT:  Head: Normocephalic and atraumatic.  Right Ear: External ear normal.  Mouth/Throat: No oropharyngeal exudate.  Eyes:  Conjunctivae and EOM are normal. Pupils are equal, round, and reactive to light.  Neck: Normal range of motion. Neck supple. No JVD present. No tracheal deviation present. No thyromegaly present.  Cardiovascular: Normal rate, regular rhythm, normal heart sounds and intact distal pulses.  Exam reveals no gallop and no friction rub.   No murmur heard. Pulmonary/Chest: Breath sounds normal. No stridor. No respiratory distress. She has no wheezes. She has no rales. She exhibits no tenderness.  Abdominal: Soft. Bowel sounds are normal. She exhibits no distension and no mass. There is no tenderness. There is no rebound and no guarding.  Musculoskeletal: Normal range of motion. She exhibits no edema and no tenderness.  Lymphadenopathy:    She has no cervical adenopathy.  Neurological: She is alert and oriented to person, place, and time. She has normal reflexes. She displays normal reflexes. No cranial nerve deficit. She exhibits normal muscle tone. Coordination normal.  Skin: Skin is warm. She is not diaphoretic.          Assessment & Plan:

## 2010-10-03 NOTE — Telephone Encounter (Signed)
Call from pt's son stating pt brought to ED last night for confusion.  Pt also has UTI Pt was to f/u with [place] today in clinic.   During ED visit her antibiotic meds were changed but have not been started. She is confused again today,   Pt scheduled for OV this PM - 1:45

## 2010-10-03 NOTE — Patient Instructions (Signed)
Please schedule a follow up appointment in 2 weeks. Please take the medicine that has been prescribed to you by the ER as directed. Please drink plenty of fluids. Please call us if your symptoms get worse.

## 2010-10-04 ENCOUNTER — Encounter: Payer: Self-pay | Admitting: Internal Medicine

## 2010-10-04 LAB — URINE CULTURE
Colony Count: >=100000
Culture  Setup Time: 201208080136

## 2010-10-04 NOTE — Assessment & Plan Note (Signed)
ER followup for altered mental status secondary to UTI. She was seen in ER on 10/02/10  where UA was positive for nitrites and leukocytes and microscopy showed 21-50 WBCs and few bacteria . She was discharged home on cefpodoxime x10 days when her mental status improved. As of today she still complains of intermittent burning but has not taken the medication that has been prescribed to her. Her urine cultures sent from the ER are still pending. For now, the patient was advised to take the antibiotic that she was prescribed from the ER. Patient was advised to drink plenty of fluids and call the clinic if her symptoms get worse. I explained the patient that I will call her if the antibiotic needs to be changed when the final results of her culture will be back. Patient voices clear understanding. Patient is known to have recurrent UTIs and was placed on continuous antibiotic prophylaxis ( Macrobid every night) during her last visit about 3 weeks ago which did not help her. She has always grown Escherichia coli urinary tract infections which is now becoming resistant to most oral antibiotics. For her recurrent UTIs, I will curbside ID and we'll consider urology referral after first discussing her case with ID.

## 2010-10-05 ENCOUNTER — Other Ambulatory Visit: Payer: Self-pay | Admitting: Internal Medicine

## 2010-10-05 NOTE — Telephone Encounter (Signed)
Pt informed

## 2010-10-05 NOTE — Telephone Encounter (Signed)
Sensitivity was OK for ceftriaxone.  Called Dr. Joylene Grapes who confirms this is hopeful for effectiveness of cefpodoxime.  Will not refill macrodantin.

## 2010-10-05 NOTE — Telephone Encounter (Signed)
Please tell her that this refill is not needed.

## 2010-10-08 NOTE — Progress Notes (Deleted)
  Subjective:    Patient ID: [name], female    DOB: 09/09/1932, 78 y.o.   MRN: [id number]  HPI    Review of Systems     Objective:   Physical Exam        Assessment & Plan:   

## 2010-10-09 ENCOUNTER — Encounter (INDEPENDENT_AMBULATORY_CARE_PROVIDER_SITE_OTHER): Payer: Medicare Other | Admitting: Ophthalmology

## 2010-10-09 DIAGNOSIS — H3530 Unspecified macular degeneration: Secondary | ICD-10-CM

## 2010-10-09 DIAGNOSIS — H43819 Vitreous degeneration, unspecified eye: Secondary | ICD-10-CM

## 2010-10-09 DIAGNOSIS — H353290 Exudative age-related macular degeneration, unspecified eye, stage unspecified: Secondary | ICD-10-CM

## 2010-10-12 ENCOUNTER — Other Ambulatory Visit: Payer: Self-pay | Admitting: Internal Medicine

## 2010-10-12 DIAGNOSIS — E039 Hypothyroidism, unspecified: Secondary | ICD-10-CM

## 2010-10-15 ENCOUNTER — Ambulatory Visit: Payer: Medicare Other

## 2010-10-16 MED ORDER — LEVOTHYROXINE SODIUM 125 MCG PO TABS: 125.0000 ug | ORAL_TABLET | Freq: Every day | ORAL | Status: DC

## 2010-10-16 NOTE — Telephone Encounter (Signed)
Recent TFTs on 150 mcg dose were in hyperthyroid range.  Will reduce dose to 125 and repeat TFTs in 3 months.

## 2010-10-19 ENCOUNTER — Other Ambulatory Visit: Payer: Self-pay | Admitting: Internal Medicine

## 2010-10-22 ENCOUNTER — Ambulatory Visit (INDEPENDENT_AMBULATORY_CARE_PROVIDER_SITE_OTHER): Payer: Medicare Other | Admitting: Internal Medicine

## 2010-10-22 ENCOUNTER — Telehealth: Payer: Self-pay | Admitting: *Deleted

## 2010-10-22 ENCOUNTER — Ambulatory Visit (INDEPENDENT_AMBULATORY_CARE_PROVIDER_SITE_OTHER): Payer: Medicare Other | Admitting: Pharmacist

## 2010-10-22 VITALS — BP 110/60 | HR 60 | Temp 97.3°F

## 2010-10-22 DIAGNOSIS — Z7901 Long term (current) use of anticoagulants: Secondary | ICD-10-CM

## 2010-10-22 DIAGNOSIS — R4182 Altered mental status, unspecified: Secondary | ICD-10-CM

## 2010-10-22 DIAGNOSIS — N390 Urinary tract infection, site not specified: Secondary | ICD-10-CM

## 2010-10-22 DIAGNOSIS — I10 Essential (primary) hypertension: Secondary | ICD-10-CM

## 2010-10-22 DIAGNOSIS — K219 Gastro-esophageal reflux disease without esophagitis: Secondary | ICD-10-CM

## 2010-10-22 DIAGNOSIS — I4891 Unspecified atrial fibrillation: Secondary | ICD-10-CM

## 2010-10-22 LAB — POCT INR: INR: 1.7

## 2010-10-22 MED ORDER — OMEPRAZOLE 20 MG PO CPDR: 40.0000 mg | DELAYED_RELEASE_CAPSULE | Freq: Two times a day (BID) | ORAL | Status: DC

## 2010-10-22 NOTE — Patient Instructions (Signed)
Patient instructed to take medications as defined in the Anti-coagulation Track section of this encounter.  Patient instructed to take today's dose.  Patient verbalized understanding of these instructions.    

## 2010-10-22 NOTE — Telephone Encounter (Signed)
Pt calls and states she needs an appt, reviewed chart, does need f/u, appt 1315 dr Dorthula Rue per doriss. Pt seems confused at times. She did speak w/ someone about transportation and states she will be here.

## 2010-10-22 NOTE — Progress Notes (Signed)
Anti-Coagulation Progress Note  [name] is a 77 y.o. female who is currently on an anti-coagulation regimen.    RECENT RESULTS: Recent results are below, the most recent result is correlated with a dose of 27.5 mg. per week: Lab Results  Component Value Date   INR 1.7 10/22/2010   INR 2.40 09/17/2010   INR 1.9 08/27/2010    ANTI-COAG DOSE:   Latest dosing instructions   Total Sun Mon Tue Wed Thu Fri Sat   32.5 2.5 mg 5 mg 5 mg 5 mg 5 mg 5 mg 5 mg    (5 mg0.5) (5 mg1) (5 mg1) (5 mg1) (5 mg1) (5 mg1) (5 mg1)         ANTICOAG SUMMARY: Anticoagulation Episode Summary              Current INR goal 2.0-3.0 Next INR check 11/19/2010   INR from last check 1.7! (10/22/2010)     Weekly max dose (mg)  Target end date Indefinite   Indications Long-term (current) use of anticoagulants, AF (atrial fibrillation)   INR check location  Preferred lab    Send INR reminders to [facility]   Comments        Provider Role Specialty Phone number   Ulyess Mort  Internal Medicine [phone]        ANTICOAG TODAY: Anticoagulation Summary as of 10/22/2010              INR goal 2.0-3.0     Selected INR 1.7! (10/22/2010) Next INR check 11/19/2010   Weekly max dose (mg)  Target end date Indefinite   Indications Long-term (current) use of anticoagulants, AF (atrial fibrillation)    Anticoagulation Episode Summary              INR check location  Preferred lab    Send INR reminders to [facility]   Comments        Provider Role Specialty Phone number   Ulyess Mort  Internal Medicine [phone]        PATIENT INSTRUCTIONS: Patient Instructions  Patient instructed to take medications as defined in the Anti-coagulation Track section of this encounter.  Patient instructed to take today's dose.  Patient verbalized understanding of these instructions.        FOLLOW-UP No Follow-up on file.  Hulen Luster, III Pharm.D., CACP

## 2010-10-22 NOTE — Progress Notes (Signed)
  Subjective:    Patient ID: [name], female    DOB: 1933-01-27, 77 y.o.   MRN: [id number]  HPI: 77 year old woman with past medical history significant for recurrent UTIs, COPD on 3 Litres home oxygen, GERD, HTN comes to  the clinic for a followup visit.  She reports intermittent burning that has been persistent. She states that when she took her antibiotics about 3 weeks ago her , burning got better on day 9 or10 th  but it  came back right after that. She denies any increased frequency, urgency, suprapubic discomfort, hematuria. Also denies any fever or chills, nausea or vomiting.  She states that for last 3 weeks she has been having pain in her chest which is located in the epigastrium region. The pain is intermittent in nature and feels like burning. She states that cool liquids make it better. She also reports some dry hacking cough which has been present for last 3 weeks. It usually happens during the night but can happen during the day and then she moves forward from her sitting position it helps with the cough. Denies any usual worsening of her leg swelling.  She also states that she gets more confused in the evenings- states that she starts mixing up days and nights.      Review of Systems  Constitutional: Negative for fever and chills.  HENT: Negative for congestion, rhinorrhea, sneezing, postnasal drip and sinus pressure.   Respiratory: Positive for cough.   Cardiovascular: Positive for chest pain.  Gastrointestinal: Negative for nausea, vomiting, abdominal pain and diarrhea.  Genitourinary: Positive for dysuria. Negative for urgency.  Musculoskeletal: Negative for arthralgias.  Neurological: Negative for headaches.  Hematological: Negative for adenopathy.       Objective:   Physical Exam  Constitutional: She is oriented to person, place, and time. She appears well-developed and well-nourished. No distress.       Morbidly obese, sitting in the wheel chair.  HENT:    Head: Normocephalic and atraumatic.  Eyes: Conjunctivae and EOM are normal. Pupils are equal, round, and reactive to light.  Neck: Normal range of motion. Neck supple. No JVD present. No tracheal deviation present. No thyromegaly present.  Cardiovascular: Normal rate, regular rhythm, normal heart sounds and intact distal pulses.  Exam reveals no gallop and no friction rub.   No murmur heard. Pulmonary/Chest: Effort normal and breath sounds normal. No stridor. No respiratory distress. She has no wheezes. She has no rales. She exhibits no tenderness.  Abdominal: Soft. Bowel sounds are normal. She exhibits no distension and no mass. There is no tenderness. There is no rebound and no guarding.  Musculoskeletal: Normal range of motion. She exhibits no edema and no tenderness.  Lymphadenopathy:    She has no cervical adenopathy.  Neurological: She is alert and oriented to person, place, and time. She has normal reflexes. She displays normal reflexes. No cranial nerve deficit. She exhibits normal muscle tone. Coordination normal.  Skin: Skin is warm. She is not diaphoretic.          Assessment & Plan:

## 2010-10-22 NOTE — Patient Instructions (Addendum)
Please drink plenty of fluids. Please schedule a follow up appointment in 1-2 months or earlier if needed. Please call the clinic on [phone]( ask for Dr. Dorthula Rue) or go to ER if your symptoms get worse. Please follow up with Dr. Sherron Monday on 10/23/10 at 3:00 PM with [facility]. Please take omeprazole 40 mg twice daily in the setting of increased burning and dry hacking cough. I have sent electronically for your changed prescription at your pharmacy.

## 2010-10-22 NOTE — Assessment & Plan Note (Signed)
Patient reports that she usually gets confused during the evenings. During this visit patient was completely alert oriented x3 he was following commands. With this waxing- waning course differentials for her confusion include delirium from UTI versus pseudodementia versus dementia. After patient left, patient's son called stating that  she doesn't need Lunesta any more. We'll take that off from her medication list.  Patient's son called requesting for prozac- will hold that in the setting of acute confusion.

## 2010-10-22 NOTE — Assessment & Plan Note (Signed)
Patient has history of more than 5-6 UTIs in last 6 months which have almost every time grown Escherichia coli which is getting pan-resistant. She has failed continuous antibiotic prophylaxis( tried Macrobid for 1 month but got a UTI on Day 28 of therapy).Reports intermittent dysuria and confusion. Denies any fever, chills, urgency , frequency or abdominal pain. I curb -sided ID who recommended to evaluate for other causes for confusion and if dysuria persists then patient might need a urology evaluation. Patient doesn't seem to be confused at today's visit( A xOx3 and is answering all the questions appropriately). -Urgent Urology referral- Patient scheduled for an appointment for tomorrow. -She was advised to call our clinic if her symptoms get worse.

## 2010-10-22 NOTE — Assessment & Plan Note (Signed)
Lab Results  Component Value Date   NA 135 10/02/2010   K 4.5 10/02/2010   CL 95* 10/02/2010   CO2 33* 10/02/2010   BUN 33* 10/02/2010   CREATININE 1.12* 10/02/2010   CREATININE 1.35* 06/18/2010    BP Readings from Last 3 Encounters:  10/22/10 110/60  10/03/10 129/64  09/03/10 132/72    Assessment: Hypertension control:  controlled  Progress toward goals:  at goal Barriers to meeting goals:  no barriers identified  Plan: Hypertension treatment:  continue current medications

## 2010-10-24 ENCOUNTER — Other Ambulatory Visit: Payer: Self-pay | Admitting: *Deleted

## 2010-10-26 ENCOUNTER — Other Ambulatory Visit: Payer: Self-pay | Admitting: Internal Medicine

## 2010-10-26 MED ORDER — TIOTROPIUM BROMIDE MONOHYDRATE 18 MCG IN CAPS: 18.0000 ug | ORAL_CAPSULE | Freq: Every day | RESPIRATORY_TRACT | Status: DC

## 2010-10-30 ENCOUNTER — Other Ambulatory Visit: Payer: Self-pay | Admitting: *Deleted

## 2010-10-30 MED ORDER — FLUOXETINE HCL 20 MG PO CAPS: 20.0000 mg | ORAL_CAPSULE | Freq: Two times a day (BID) | ORAL | Status: DC

## 2010-10-30 NOTE — Telephone Encounter (Signed)
[name],    I cannot find how many she takes a day.  Please find out and refill until the end of time.

## 2010-10-31 NOTE — Telephone Encounter (Signed)
Pt spoke with triage nurse and stated that she takes one pill a day.  Pharmacy has to take as directed on file with pt getting #60.  Will call in new order with once a day dosing #30 with prn refills.  Pt has an appt in 2 days.Kingsley Spittle Cassady9/5/20124:15 PM  Order electronically sent to pharmacy and order sent to MD for signature.Kingsley Spittle Cassady9/5/20124:20 PM

## 2010-11-01 ENCOUNTER — Other Ambulatory Visit: Payer: Self-pay | Admitting: Internal Medicine

## 2010-11-01 NOTE — Telephone Encounter (Signed)
Pt not sure of or unable to repeat instructions.  She has an appt tomorrow and has been asked to bring all her medications to appt.Kingsley Spittle Cassady9/6/201211:51 AM

## 2010-11-02 ENCOUNTER — Ambulatory Visit (INDEPENDENT_AMBULATORY_CARE_PROVIDER_SITE_OTHER): Payer: Medicare Other | Admitting: Internal Medicine

## 2010-11-02 ENCOUNTER — Encounter: Payer: Self-pay | Admitting: Internal Medicine

## 2010-11-02 VITALS — BP 130/82 | HR 54 | Temp 97.0°F | Wt 315.9 lb

## 2010-11-02 DIAGNOSIS — E039 Hypothyroidism, unspecified: Secondary | ICD-10-CM

## 2010-11-02 DIAGNOSIS — Z23 Encounter for immunization: Secondary | ICD-10-CM

## 2010-11-02 DIAGNOSIS — R4182 Altered mental status, unspecified: Secondary | ICD-10-CM

## 2010-11-02 MED ORDER — MONTELUKAST SODIUM 10 MG PO TABS: ORAL_TABLET | ORAL | Status: DC

## 2010-11-02 NOTE — Patient Instructions (Signed)
Do not go above 3 lpm on your oxygen. Stop reglan. Try not to go to bed before 9 PM. It would be good if your son can come with you to the next visit.

## 2010-11-02 NOTE — Progress Notes (Signed)
60 woman with many problems and many meds.  Her main concern today is confusion, especially at night.  On exam, she has normal recent and remote mmory but answers all questions slowly and seems to have trouble focusing. Cranial nerves and motor exam normal. She had a sleep study in May that did not show enough problems to suggest CPAP. Has home O2 and has sat of 89 and 90 on O2 with slight exertion here today. Report of confusion has occurred many times over past 1-2 years and each time I actually see her, she is clear and good-humored.  Today, she seems slow, distracted and depressed.

## 2010-11-02 NOTE — Assessment & Plan Note (Signed)
See HPI.  Thyroid tests have had normal TSH and slightly high Free T4.  Will repeat today.  Cmet unremarkable last month. Reviewed meds.  She brings in all meds except inhalers.  Does not have ativan or hydrocodone.  Pharmacy reports many months since those were refilled. She is on reglan qid and has refilled this in August.  This can cause confusion.  Possible causes are reglan, chronic hypoxia, new CNS event (e.g., stroke), sleep disturbance (reports insomnia), depression. Will stop reglan, repeat TFTs. She does report dialing up her O2 at home -- could she be provoking resp acidosis?  Will ask her to stay with 2-3 lpm.

## 2010-11-06 ENCOUNTER — Emergency Department (HOSPITAL_COMMUNITY)
Admission: EM | Admit: 2010-11-06 | Discharge: 2010-11-06 | Disposition: A | Discharge status: Home / Self Care | Payer: Medicare Other | Attending: Emergency Medicine | Admitting: Emergency Medicine

## 2010-11-06 ENCOUNTER — Emergency Department (HOSPITAL_COMMUNITY): Payer: Medicare Other

## 2010-11-06 DIAGNOSIS — Z8679 Personal history of other diseases of the circulatory system: Secondary | ICD-10-CM | POA: Insufficient documentation

## 2010-11-06 DIAGNOSIS — I4891 Unspecified atrial fibrillation: Secondary | ICD-10-CM | POA: Insufficient documentation

## 2010-11-06 DIAGNOSIS — I2510 Atherosclerotic heart disease of native coronary artery without angina pectoris: Secondary | ICD-10-CM | POA: Insufficient documentation

## 2010-11-06 DIAGNOSIS — I252 Old myocardial infarction: Secondary | ICD-10-CM | POA: Insufficient documentation

## 2010-11-06 DIAGNOSIS — E039 Hypothyroidism, unspecified: Secondary | ICD-10-CM | POA: Insufficient documentation

## 2010-11-06 DIAGNOSIS — E119 Type 2 diabetes mellitus without complications: Secondary | ICD-10-CM | POA: Insufficient documentation

## 2010-11-06 DIAGNOSIS — I10 Essential (primary) hypertension: Secondary | ICD-10-CM | POA: Insufficient documentation

## 2010-11-06 DIAGNOSIS — K5900 Constipation, unspecified: Secondary | ICD-10-CM | POA: Insufficient documentation

## 2010-11-06 DIAGNOSIS — E785 Hyperlipidemia, unspecified: Secondary | ICD-10-CM | POA: Insufficient documentation

## 2010-11-06 DIAGNOSIS — J449 Chronic obstructive pulmonary disease, unspecified: Secondary | ICD-10-CM | POA: Insufficient documentation

## 2010-11-06 DIAGNOSIS — I509 Heart failure, unspecified: Secondary | ICD-10-CM | POA: Insufficient documentation

## 2010-11-06 DIAGNOSIS — J4489 Other specified chronic obstructive pulmonary disease: Secondary | ICD-10-CM | POA: Insufficient documentation

## 2010-11-06 DIAGNOSIS — N390 Urinary tract infection, site not specified: Secondary | ICD-10-CM | POA: Insufficient documentation

## 2010-11-06 DIAGNOSIS — K219 Gastro-esophageal reflux disease without esophagitis: Secondary | ICD-10-CM | POA: Insufficient documentation

## 2010-11-06 DIAGNOSIS — R109 Unspecified abdominal pain: Secondary | ICD-10-CM | POA: Insufficient documentation

## 2010-11-06 LAB — URINALYSIS, ROUTINE W REFLEX MICROSCOPIC
Glucose, UA: NEGATIVE mg/dL
Hgb urine dipstick: NEGATIVE
Ketones, ur: NEGATIVE mg/dL
Protein, ur: NEGATIVE mg/dL
pH: 6 (ref 5.0–8.0)

## 2010-11-06 LAB — URINE MICROSCOPIC-ADD ON

## 2010-11-08 LAB — URINE CULTURE: Culture  Setup Time: 201209112215

## 2010-11-14 ENCOUNTER — Encounter: Payer: Self-pay | Admitting: Internal Medicine

## 2010-11-14 ENCOUNTER — Ambulatory Visit (INDEPENDENT_AMBULATORY_CARE_PROVIDER_SITE_OTHER): Payer: Medicare Other | Admitting: Internal Medicine

## 2010-11-14 ENCOUNTER — Telehealth: Payer: Self-pay | Admitting: *Deleted

## 2010-11-14 ENCOUNTER — Inpatient Hospital Stay (HOSPITAL_COMMUNITY)
Admission: AD | Admit: 2010-11-14 | Discharge: 2010-11-16 | DRG: 690 | Disposition: A | Discharge status: Home Health | Payer: Medicare Other | Source: Ambulatory Visit | Attending: Internal Medicine | Admitting: Internal Medicine

## 2010-11-14 ENCOUNTER — Inpatient Hospital Stay (HOSPITAL_COMMUNITY): Payer: Medicare Other

## 2010-11-14 VITALS — BP 127/72 | HR 78 | Temp 97.2°F | Ht 61.0 in | Wt 310.2 lb

## 2010-11-14 DIAGNOSIS — E119 Type 2 diabetes mellitus without complications: Secondary | ICD-10-CM | POA: Diagnosis present

## 2010-11-14 DIAGNOSIS — N12 Tubulo-interstitial nephritis, not specified as acute or chronic: Secondary | ICD-10-CM | POA: Insufficient documentation

## 2010-11-14 DIAGNOSIS — J449 Chronic obstructive pulmonary disease, unspecified: Secondary | ICD-10-CM | POA: Diagnosis present

## 2010-11-14 DIAGNOSIS — A498 Other bacterial infections of unspecified site: Secondary | ICD-10-CM | POA: Diagnosis present

## 2010-11-14 DIAGNOSIS — I509 Heart failure, unspecified: Secondary | ICD-10-CM | POA: Diagnosis present

## 2010-11-14 DIAGNOSIS — M1990 Unspecified osteoarthritis, unspecified site: Secondary | ICD-10-CM | POA: Diagnosis present

## 2010-11-14 DIAGNOSIS — F3289 Other specified depressive episodes: Secondary | ICD-10-CM | POA: Diagnosis present

## 2010-11-14 DIAGNOSIS — Z7901 Long term (current) use of anticoagulants: Secondary | ICD-10-CM

## 2010-11-14 DIAGNOSIS — Z8673 Personal history of transient ischemic attack (TIA), and cerebral infarction without residual deficits: Secondary | ICD-10-CM

## 2010-11-14 DIAGNOSIS — K219 Gastro-esophageal reflux disease without esophagitis: Secondary | ICD-10-CM | POA: Diagnosis present

## 2010-11-14 DIAGNOSIS — J4489 Other specified chronic obstructive pulmonary disease: Secondary | ICD-10-CM | POA: Diagnosis present

## 2010-11-14 DIAGNOSIS — E785 Hyperlipidemia, unspecified: Secondary | ICD-10-CM | POA: Diagnosis present

## 2010-11-14 DIAGNOSIS — M109 Gout, unspecified: Secondary | ICD-10-CM | POA: Diagnosis present

## 2010-11-14 DIAGNOSIS — Z79899 Other long term (current) drug therapy: Secondary | ICD-10-CM

## 2010-11-14 DIAGNOSIS — R32 Unspecified urinary incontinence: Secondary | ICD-10-CM | POA: Diagnosis present

## 2010-11-14 DIAGNOSIS — F329 Major depressive disorder, single episode, unspecified: Secondary | ICD-10-CM | POA: Diagnosis present

## 2010-11-14 DIAGNOSIS — I10 Essential (primary) hypertension: Secondary | ICD-10-CM | POA: Diagnosis present

## 2010-11-14 DIAGNOSIS — N10 Acute tubulo-interstitial nephritis: Principal | ICD-10-CM | POA: Diagnosis present

## 2010-11-14 DIAGNOSIS — I4891 Unspecified atrial fibrillation: Secondary | ICD-10-CM | POA: Diagnosis present

## 2010-11-14 DIAGNOSIS — E039 Hypothyroidism, unspecified: Secondary | ICD-10-CM | POA: Diagnosis present

## 2010-11-14 LAB — CBC
MCHC: 33.4 g/dL (ref 30.0–36.0)
Platelets: 190 10*3/uL (ref 150–400)
RDW: 13.6 % (ref 11.5–15.5)
WBC: 9.5 10*3/uL (ref 4.0–10.5)

## 2010-11-14 LAB — GLUCOSE, CAPILLARY
Glucose-Capillary: 245 mg/dL — ABNORMAL HIGH (ref 70–99)
Glucose-Capillary: 299 mg/dL — ABNORMAL HIGH (ref 70–99)

## 2010-11-14 LAB — MRSA PCR SCREENING: MRSA by PCR: NEGATIVE

## 2010-11-14 LAB — PROTIME-INR: INR: 1.57 — ABNORMAL HIGH (ref 0.00–1.49)

## 2010-11-14 LAB — BASIC METABOLIC PANEL
Calcium: 10.1 mg/dL (ref 8.4–10.5)
GFR calc non Af Amer: 36 mL/min — ABNORMAL LOW (ref >60)
Glucose, Bld: 251 mg/dL — ABNORMAL HIGH (ref 70–99)
Sodium: 134 mEq/L — ABNORMAL LOW (ref 135–145)

## 2010-11-14 LAB — DIFFERENTIAL
Basophils Absolute: 0.1 10*3/uL (ref 0.0–0.1)
Basophils Relative: 1 % (ref 0–1)
Eosinophils Relative: 6 % — ABNORMAL HIGH (ref 0–5)
Monocytes Absolute: 0.8 10*3/uL (ref 0.1–1.0)

## 2010-11-14 NOTE — H&P (Signed)
Hospital Admission Note Date: 11/14/2010  Patient name: [name] Medical record number: [id number] Date of birth: 1932/03/19 Age: 77 y.o. Gender: female PCP: Ulyess Mort, MD  Medical Service: B 2  Attending physician:   Dr. Phillips Odor Pager: Resident [phone]):   Dr. Anselm Jungling  Pager:[phone] Resident (R1):   Dr. Dorise Hiss Pager:[phone]  Chief Complaint: UTI  History of Present Illness: The patient is a 77 yo womanman, history of multiple UTI's, hypothyroidism, DM (managed without medication), HL, HTN, CHF, COPD, afib, and a prior CVA, presenting with anuria. The patient was seen in the ED 11/06/10 for constipation, which was successfully treated in the ED with mag citrate, and was found to have a UTI on UA. The patient was given 1 dose of macrobid, then discharged on Keflex 500 mg BID x7 days, which the patient completed. Urine cultures later resulted E Coli, sensitive to only ceftriaxone, gentamicin, and tobramycin. The patient has a history of urinary incontinence, and typically soaks through her pad twice per day. Last night and this morning, however, the patient's son notes that her pad has been dry (ie it was last wet yesterday morning). The patient notes no dysuria or pelvic pain, no fevers/chills, no increased LE edema or SOB, and no nausea/vomiting over the last week, but does note that she has been more confused than usual lately. She reports occasional abdominal pain when she is constipated, but no abdominal or flank pain presently. Her last BM was yesterday. Upon review of laboratory data, it appears that the patient has had 7 UTI's in the past 12 months.   Meds: Current Outpatient Prescriptions  Medication Sig Dispense Refill  . acetaminophen (TYLENOL) 500 MG tablet Take 500 mg by mouth. Take two tablets by mouth two times a day       . albuterol (PROVENTIL HFA) 108 (90 BASE) MCG/ACT inhaler Inhale 2 puffs into the lungs every 6 (six) hours as needed. 1 to 2 puffs two times a day as needed  for wheezing       . amLODipine (NORVASC) 5 MG tablet Take 5 mg by mouth daily.        . colchicine (COLCRYS) 0.6 MG tablet Take 1 tablet (0.6 mg total) by mouth daily.  30 tablet  11  . FLUoxetine (PROZAC) 20 MG capsule Take 1 capsule (20 mg total) by mouth 2 (two) times daily.  62 capsule  6  . Fluticasone-Salmeterol (ADVAIR DISKUS) 250-50 MCG/DOSE AEPB Inhale 1 puff into the lungs 2 (two) times daily.        . furosemide (LASIX) 40 MG tablet TAKE TWO TABLETS BY MOUTH EVERY DAY  60 tablet  6  . levothyroxine (SYNTHROID, LEVOTHROID) 125 MCG tablet Take 1 tablet (125 mcg total) by mouth daily.  31 tablet  3  . LIPITOR 40 MG tablet TAKE ONE TABLET BY MOUTH EVERY DAY  31 each  6  . losartan (COZAAR) 50 MG tablet Take 1 tablet (50 mg total) by mouth 2 (two) times daily.  60 tablet  6  . montelukast (SINGULAIR) 10 MG tablet TAKE ONE TABLET BY MOUTH EVERY DAY  31 tablet  11  . omeprazole (PRILOSEC) 20 MG capsule Take 2 capsules (40 mg total) by mouth 2 (two) times daily.  60 capsule  3  . polyethylene glycol (MIRALAX) powder Take 17 g by mouth daily. Use as directed       . spironolactone (ALDACTONE) 25 MG tablet Take 1 tablet (25 mg total) by mouth daily.  31 tablet  6  . tiotropium (SPIRIVA) 18 MCG inhalation capsule Place 1 capsule (18 mcg total) into inhaler and inhale daily.  30 capsule  6  . tolterodine (DETROL LA) 2 MG 24 hr capsule Take 2 mg by mouth daily.        Marland Kitchen warfarin (COUMADIN) 5 MG tablet TAKE AS DIRECTED BY MOUTH  31 tablet  3    Allergies: Nitrofurantoin  Past Medical History  Diagnosis Date  . CAD (coronary artery disease)     s/p remote MI (20+ yrs ago)  . Abdominal pain     recent admission, likely secondary to presbyesophagus and gastric dysmotility  . AF (atrial fibrillation)     on chronic warfarin  . CVD (cardiovascular disease)   . Diastolic dysfunction     with dilated cardiomyopathy, last EF 55%(09/2006)  . Respiratory failure, chronic     mixed etiology with  bronchospastic component  . Urinary incontinence     recurrent uti's/resistance to cipro, bactrim  . Gout   . Hypothyroidism   . Hyperlipidemia   . Depression   . Osteoarthritis   . Morbidly obese     s/p gastric bypass  . Gastroesophageal reflux disease   . Ventral hernia     repair in April 2008, complicated by MRSA abdominal wall cellulitis  . DVT (deep venous thrombosis)   . Pulmonary embolism     1998, s/p Greenfield filter  . Diabetes mellitus   . Hypertension    Past Surgical History  Procedure Date  . Hernia repair   . Cholecystectomy   . Appendectomy   . Breast biopsy    Family History  Problem Relation Age of Onset  . Colon cancer Neg Hx    History   Social History  . Marital Status: Widowed    Spouse Name: N/A    Number of Children: N/A  . Years of Education: N/A   Occupational History  . Not on file.   Social History Main Topics  . Smoking status: Former Smoker    Types: Cigarettes  . Smokeless tobacco: Former Neurosurgeon    Quit date: 05/22/1968  . Alcohol Use: No  . Drug Use: No  . Sexually Active: No   Other Topics Concern  . Not on file   Social History Narrative   She lives w/her son. Her son offers help and she has an aid who occasionally cooks for her and takes care of some other tasks around the pt's house.Has medicare. Has son in [place] who is POAAmbivalent about DNR issues and does not want a STOP sign posted in home    Review of Systems: Pertinent items are noted in HPI.  Physical Exam:  Vitals: BP:  127/72   Pulse:  78   Temp:  97.2 F (36.2 C)   RR: 20 General: alert, cooperative, appears confused HEENT: pupils equal round and reactive to light, vision grossly intact, oropharynx clear and non-erythematous  Neck: supple, no lymphadenopathy, no JVD Lungs: clear to ascultation bilaterally, normal work of respiration, no wheezes, rales, ronchi Heart: regular rate and rhythm, no murmurs, gallops, or rubs Abdomen: soft, tender to deep  palpation of LLQ, non-tender to suprapubic palpation, non-distended, +bs, no guarding or rebound tenderness  Back: R CVA tenderness noted. Possible L CVA tenderness vs L back pain.  Extremities: 2+ DP/PT pulses bilaterally, chronic non-pitting b/l LE edema, no cyanosis, clubbing Neurologic: alert & oriented to person, place, and day of the week (also month and year although not day),  cranial nerves II-XII intact, strength and sensation grossly intact   CBG:  Basename 11/14/10 1520  GLUCAP 333*   Assessment & Plan by Problem: 1. UTI - U cultures taken recently indicate only sensitive to rocephin, tobramycin, and gentamycin. Will admit for IV antibiotics. Some confusion. Stat CBC, stat renal US to eval for pyelonephritis. If pt unable to void can use prn I/O cath. Start Ceftriaxone IV.   2. Anuria - pt has been anuria for approx last 24 hrs. Will start NS @ 125 cc/hr. Will order stat BMET to assess Cr. Repeat in the morning.   3. A. Fib - PT/INR now to eval. Continue warfarin.   4. HPL - lipitor continued.  5. COPD - spiriva/advair/albuterol HFA continued. Not in acute exacerbation. No SOB.   6. Depression - Prozac continued.   7. Urinary incontinence - Detrol LA continued. Will D/C if needed.   8. GERD - Continue omeprazole.  9. Allergies - continue singulair.  10. HTN - continue cozaar, lasix 80 mg daily.   11. Hypothyroidism - continued levothyroxine. Last TSH normal on 11/02/10  12. CHF - pt is on spironolactone and lasix. Will continue.   13. OA - tylenol for pain.  14. Hx CVA - pt on warfarin  15. Hx PE - pt on warfarin  16. Morbid obesity - nutrition consult while pt is in hospital.   17. DM II - pt is diet controlled right now. Last HgA1C 7.4 in July 2012.   18. Gout - pt takes colchicine, held.  R2/3______________________________      R1________________________________  ATTENDING: I performed and/or observed a history and physical examination of the patient.   I discussed the case with the residents as noted and reviewed the residents' notes.  I agree with the findings and plan--please refer to the attending physician note for more details.  Signature________________________________  Printed Name_____________________________

## 2010-11-14 NOTE — Progress Notes (Signed)
HPI The patient is a 77 yo woman, history of multiple UTI's, hypothyroidism, DM (managed without medication), HL, HTN, CHF, COPD, afib, and a prior CVA, presenting with anuria.  The patient was seen in the ED 11/06/10 for constipation, which was successfully treated in the ED with mag citrate, and was found to have a UTI on UA.  The patient was given 1 dose of macrobid, then discharged on Keflex 500 mg BID x7 days, which the patient completed.  Urine cultures later resulted E Coli, sensitive to only ceftriaxone, gentamicin, and tobramycin.  The patient has a history of urinary incontinence, and typically soaks through her pad twice per day.  Last night and this morning, however, the patient's son notes that her pad has been dry (ie it was last wet yesterday morning).  The patient notes no dysuria or pelvic pain, no fevers/chills, no increased LE edema or SOB, and no nausea/vomiting over the last week, but does note that she has been more confused than usual lately.  She reports occasional abdominal pain when she is constipated, but no abdominal or flank pain presently.  Her last BM was yesterday.  Upon review of laboratory data, it appears that the patient has had 7 UTI's in the past 12 months.  ROS: General: no fevers, chills, changes in weight, changes in appetite Skin: no rash HEENT: no blurry vision, hearing changes, sore throat Pulm: no dyspnea, coughing, wheezing CV: no chest pain, palpitations, shortness of breath Abd: no abdominal pain, nausea/vomiting, diarrhea/constipation GU: no dysuria, hematuria, polyuria Ext: no arthralgias, myalgias Neuro: no weakness, numbness, or tingling  Filed Vitals:   11/14/10 1455  BP: 127/72  Pulse: 78  Temp: 97.2 F (36.2 C)    PEX General: alert, cooperative, appears confused HEENT: pupils equal round and reactive to light, vision grossly intact, oropharynx clear and non-erythematous  Neck: supple, no lymphadenopathy, no JVD Lungs: clear to  ascultation bilaterally, normal work of respiration, no wheezes, rales, ronchi Heart: regular rate and rhythm, no murmurs, gallops, or rubs Abdomen: soft, tender to deep palpation of LLQ, non-tender to suprapubic palpation, non-distended, +bs, no guarding or rebound tenderness   Back: R CVA tenderness noted.  Possible L CVA tenderness vs L back pain. Extremities: 2+ DP/PT pulses bilaterally, chronic non-pitting b/l LE edema, no cyanosis, clubbing Neurologic: alert & oriented to person, place, and day of the week (though not date or month), cranial nerves II-XII intact, strength and sensation grossly intact  Assessment/Plan

## 2010-11-14 NOTE — Assessment & Plan Note (Addendum)
The patient presented to the ED 9/11 with a UTI, found to be largely antibiotic-resistant, now anuric x24 hours, with CVA tenderness, though afebrile -given anuria, antibiotic resistance to PO antibiotics, and confusion, we will admit this patient to the inpatient service -IV ceftriaxone -IVF -BMET -?repeat UA and culture -patient will likely need prophylactic abx after clearing her current infection.  Patient's son's phone number: [phone]

## 2010-11-14 NOTE — Telephone Encounter (Signed)
Call from pt's son said that mom who usually is incontinent of urine has not voided since earlier yesterday.  Pt is usually soaked when she is put to bed.  This am pt was again dry this am when she is usually soaked with urine.  Pt's son said that the pt said that she is not feeling well.  Also pt at this time is not in pain or having fevers.  Pt has a history of frequent UTI's and incontinence.  Pt was given an appointment for 2:30 this afternoon.

## 2010-11-14 NOTE — Patient Instructions (Signed)
Admitted to Inpatient service.

## 2010-11-15 DIAGNOSIS — N159 Renal tubulo-interstitial disease, unspecified: Secondary | ICD-10-CM

## 2010-11-15 LAB — CBC
HCT: 39.1 % (ref 36.0–46.0)
RDW: 13.8 % (ref 11.5–15.5)
WBC: 8.1 10*3/uL (ref 4.0–10.5)

## 2010-11-15 LAB — BASIC METABOLIC PANEL
BUN: 30 mg/dL — ABNORMAL HIGH (ref 6–23)
Chloride: 99 mEq/L (ref 96–112)
GFR calc Af Amer: 55 mL/min — ABNORMAL LOW (ref >60)
Potassium: 3.8 mEq/L (ref 3.5–5.1)
Sodium: 138 mEq/L (ref 135–145)

## 2010-11-15 LAB — GLUCOSE, CAPILLARY
Glucose-Capillary: 304 mg/dL — ABNORMAL HIGH (ref 70–99)
Glucose-Capillary: 326 mg/dL — ABNORMAL HIGH (ref 70–99)

## 2010-11-15 LAB — PROTIME-INR
INR: 2.04 — ABNORMAL HIGH (ref 0.00–1.49)
Prothrombin Time: 23.4 seconds — ABNORMAL HIGH (ref 11.6–15.2)

## 2010-11-15 NOTE — Progress Notes (Signed)
Dr. Manson Passey discussed patient with me. I agree with entire assessment and plan.

## 2010-11-16 DIAGNOSIS — N159 Renal tubulo-interstitial disease, unspecified: Secondary | ICD-10-CM

## 2010-11-17 ENCOUNTER — Emergency Department (HOSPITAL_COMMUNITY)
Admission: EM | Admit: 2010-11-17 | Discharge: 2010-11-17 | Disposition: A | Discharge status: Home / Self Care | Payer: Medicare Other | Attending: Emergency Medicine | Admitting: Emergency Medicine

## 2010-11-17 DIAGNOSIS — N12 Tubulo-interstitial nephritis, not specified as acute or chronic: Secondary | ICD-10-CM | POA: Insufficient documentation

## 2010-11-17 DIAGNOSIS — Z87891 Personal history of nicotine dependence: Secondary | ICD-10-CM | POA: Insufficient documentation

## 2010-11-18 ENCOUNTER — Emergency Department (HOSPITAL_COMMUNITY)
Admission: EM | Admit: 2010-11-18 | Discharge: 2010-11-18 | Disposition: A | Discharge status: Home / Self Care | Payer: Medicare Other | Attending: Emergency Medicine | Admitting: Emergency Medicine

## 2010-11-18 DIAGNOSIS — Z8673 Personal history of transient ischemic attack (TIA), and cerebral infarction without residual deficits: Secondary | ICD-10-CM | POA: Insufficient documentation

## 2010-11-18 DIAGNOSIS — I252 Old myocardial infarction: Secondary | ICD-10-CM | POA: Insufficient documentation

## 2010-11-18 DIAGNOSIS — E119 Type 2 diabetes mellitus without complications: Secondary | ICD-10-CM | POA: Insufficient documentation

## 2010-11-18 DIAGNOSIS — E669 Obesity, unspecified: Secondary | ICD-10-CM | POA: Insufficient documentation

## 2010-11-18 DIAGNOSIS — E039 Hypothyroidism, unspecified: Secondary | ICD-10-CM | POA: Insufficient documentation

## 2010-11-18 DIAGNOSIS — I10 Essential (primary) hypertension: Secondary | ICD-10-CM | POA: Insufficient documentation

## 2010-11-18 DIAGNOSIS — K219 Gastro-esophageal reflux disease without esophagitis: Secondary | ICD-10-CM | POA: Insufficient documentation

## 2010-11-18 DIAGNOSIS — I509 Heart failure, unspecified: Secondary | ICD-10-CM | POA: Insufficient documentation

## 2010-11-18 DIAGNOSIS — J4489 Other specified chronic obstructive pulmonary disease: Secondary | ICD-10-CM | POA: Insufficient documentation

## 2010-11-18 DIAGNOSIS — E785 Hyperlipidemia, unspecified: Secondary | ICD-10-CM | POA: Insufficient documentation

## 2010-11-18 DIAGNOSIS — J449 Chronic obstructive pulmonary disease, unspecified: Secondary | ICD-10-CM | POA: Insufficient documentation

## 2010-11-18 DIAGNOSIS — F411 Generalized anxiety disorder: Secondary | ICD-10-CM | POA: Insufficient documentation

## 2010-11-18 DIAGNOSIS — I2510 Atherosclerotic heart disease of native coronary artery without angina pectoris: Secondary | ICD-10-CM | POA: Insufficient documentation

## 2010-11-18 DIAGNOSIS — N12 Tubulo-interstitial nephritis, not specified as acute or chronic: Secondary | ICD-10-CM | POA: Insufficient documentation

## 2010-11-19 ENCOUNTER — Encounter (HOSPITAL_COMMUNITY): Payer: Medicare Other | Attending: Internal Medicine

## 2010-11-19 ENCOUNTER — Ambulatory Visit: Payer: Medicare Other

## 2010-11-19 DIAGNOSIS — N12 Tubulo-interstitial nephritis, not specified as acute or chronic: Secondary | ICD-10-CM | POA: Insufficient documentation

## 2010-11-19 DIAGNOSIS — N390 Urinary tract infection, site not specified: Secondary | ICD-10-CM | POA: Insufficient documentation

## 2010-11-19 LAB — CBC
HCT: 31.4 — ABNORMAL LOW
HCT: 32.8 — ABNORMAL LOW
HCT: 33 — ABNORMAL LOW
Hemoglobin: 10.9 — ABNORMAL LOW
Hemoglobin: 11 — ABNORMAL LOW
MCV: 81.9
Platelets: 171
Platelets: 174
Platelets: 191
RBC: 4.04
RDW: 16.9 — ABNORMAL HIGH
RDW: 17.1 — ABNORMAL HIGH
RDW: 17.3 — ABNORMAL HIGH
WBC: 14.9 — ABNORMAL HIGH
WBC: 7.1
WBC: 7.6
WBC: 9

## 2010-11-19 LAB — URINALYSIS, MICROSCOPIC ONLY
Glucose, UA: NEGATIVE
Specific Gravity, Urine: 1.02
pH: 5.5

## 2010-11-19 LAB — BASIC METABOLIC PANEL
BUN: 18
BUN: 29 — ABNORMAL HIGH
BUN: 9
Calcium: 8.4
Calcium: 8.5
Calcium: 8.5
Calcium: 8.6
Creatinine, Ser: 0.95
Creatinine, Ser: 1.28 — ABNORMAL HIGH
GFR calc Af Amer: 50 — ABNORMAL LOW
GFR calc Af Amer: >60
GFR calc non Af Amer: 41 — ABNORMAL LOW
GFR calc non Af Amer: 58 — ABNORMAL LOW
GFR calc non Af Amer: >60
GFR calc non Af Amer: >60
Glucose, Bld: 113 — ABNORMAL HIGH
Glucose, Bld: 117 — ABNORMAL HIGH
Potassium: 3.3 — ABNORMAL LOW
Potassium: 3.8
Sodium: 136
Sodium: 137

## 2010-11-19 LAB — DIFFERENTIAL
Basophils Absolute: 0
Basophils Absolute: 0
Basophils Absolute: 0
Eosinophils Absolute: 0.1
Eosinophils Relative: 0
Eosinophils Relative: 4
Eosinophils Relative: 5
Lymphocytes Relative: 12
Lymphocytes Relative: 15
Lymphocytes Relative: 17
Lymphs Abs: 0.9
Lymphs Abs: 1.1
Lymphs Abs: 1.2
Lymphs Abs: 1.2
Monocytes Absolute: 0.9
Monocytes Absolute: 1.1 — ABNORMAL HIGH
Monocytes Relative: 14 — ABNORMAL HIGH
Monocytes Relative: 6
Neutro Abs: 4.3
Neutro Abs: 5
Neutrophils Relative %: 74

## 2010-11-19 LAB — CULTURE, BLOOD (ROUTINE X 2)

## 2010-11-19 LAB — URINE CULTURE

## 2010-11-19 LAB — PROTIME-INR
INR: 2.8 — ABNORMAL HIGH
INR: 2.9 — ABNORMAL HIGH
INR: 3 — ABNORMAL HIGH
Prothrombin Time: 30.1 — ABNORMAL HIGH
Prothrombin Time: 31.4 — ABNORMAL HIGH
Prothrombin Time: 31.9 — ABNORMAL HIGH

## 2010-11-19 LAB — BLOOD GAS, ARTERIAL
Bicarbonate: 29.7 — ABNORMAL HIGH
Drawn by: 658
O2 Content: 2
O2 Saturation: 89.8
Patient temperature: 98.6
pH, Arterial: 7.434 — ABNORMAL HIGH
pO2, Arterial: 58.8 — ABNORMAL LOW

## 2010-11-19 LAB — MAGNESIUM: Magnesium: 1.8

## 2010-11-19 LAB — LEGIONELLA ANTIGEN, URINE: Legionella Antigen, Urine: NEGATIVE

## 2010-11-19 LAB — COMPREHENSIVE METABOLIC PANEL
ALT: 18
AST: 20
Albumin: 2.6 — ABNORMAL LOW
CO2: 31
Calcium: 8.7
Creatinine, Ser: 1.21 — ABNORMAL HIGH
GFR calc Af Amer: 53 — ABNORMAL LOW
Sodium: 134 — ABNORMAL LOW

## 2010-11-19 LAB — HEMOGLOBIN A1C
Hgb A1c MFr Bld: 6.7 — ABNORMAL HIGH
Mean Plasma Glucose: 161

## 2010-11-19 LAB — INFLUENZA A & B ANTIBODIES: Influenza A Virus Ab, IgG: 0.68 IV

## 2010-11-19 LAB — STREP PNEUMONIAE URINARY ANTIGEN: Strep Pneumo Urinary Antigen: NEGATIVE

## 2010-11-20 ENCOUNTER — Encounter (HOSPITAL_COMMUNITY): Payer: Medicare Other

## 2010-11-21 ENCOUNTER — Encounter (HOSPITAL_COMMUNITY): Payer: Medicare Other

## 2010-11-22 ENCOUNTER — Encounter (HOSPITAL_COMMUNITY): Payer: Medicare Other

## 2010-11-22 LAB — D-DIMER, QUANTITATIVE: D-Dimer, Quant: 1.7 — ABNORMAL HIGH

## 2010-11-22 LAB — COMPREHENSIVE METABOLIC PANEL
ALT: 16
Alkaline Phosphatase: 134 — ABNORMAL HIGH
BUN: 15
CO2: 36 — ABNORMAL HIGH
Calcium: 8.9
GFR calc non Af Amer: >60
Glucose, Bld: 185 — ABNORMAL HIGH
Sodium: 139

## 2010-11-22 LAB — DIFFERENTIAL
Basophils Relative: 2 — ABNORMAL HIGH
Eosinophils Absolute: 0.2
Lymphs Abs: 1.3
Neutro Abs: 6.2
Neutrophils Relative %: 73

## 2010-11-22 LAB — CBC
HCT: 32.5 — ABNORMAL LOW
Hemoglobin: 10.8 — ABNORMAL LOW
MCHC: 33.2
RBC: 3.73 — ABNORMAL LOW

## 2010-11-22 LAB — POCT CARDIAC MARKERS
CKMB, poc: 1.3
Operator id: 294501
Troponin i, poc: 0.05

## 2010-11-22 LAB — PROTIME-INR: Prothrombin Time: 31.8 — ABNORMAL HIGH

## 2010-11-23 ENCOUNTER — Encounter (HOSPITAL_COMMUNITY): Payer: Medicare Other

## 2010-11-27 ENCOUNTER — Encounter (INDEPENDENT_AMBULATORY_CARE_PROVIDER_SITE_OTHER): Payer: Medicare Other | Admitting: Ophthalmology

## 2010-11-27 DIAGNOSIS — H43819 Vitreous degeneration, unspecified eye: Secondary | ICD-10-CM

## 2010-11-27 DIAGNOSIS — H353290 Exudative age-related macular degeneration, unspecified eye, stage unspecified: Secondary | ICD-10-CM

## 2010-11-27 DIAGNOSIS — H3530 Unspecified macular degeneration: Secondary | ICD-10-CM

## 2010-11-27 LAB — COMPREHENSIVE METABOLIC PANEL
Alkaline Phosphatase: 102 U/L (ref 39–117)
BUN: 11 mg/dL (ref 6–23)
BUN: 13 mg/dL (ref 6–23)
CO2: 34 mEq/L — ABNORMAL HIGH (ref 19–32)
Calcium: 10.2 mg/dL (ref 8.4–10.5)
Chloride: 99 mEq/L (ref 96–112)
GFR calc non Af Amer: >60 mL/min (ref >60)
Glucose, Bld: 125 mg/dL — ABNORMAL HIGH (ref 70–99)
Glucose, Bld: 132 mg/dL — ABNORMAL HIGH (ref 70–99)
Potassium: 3.7 mEq/L (ref 3.5–5.1)
Total Bilirubin: 0.5 mg/dL (ref 0.3–1.2)
Total Protein: 7.4 g/dL (ref 6.0–8.3)

## 2010-11-27 LAB — BASIC METABOLIC PANEL
BUN: 12 mg/dL (ref 6–23)
CO2: 33 mEq/L — ABNORMAL HIGH (ref 19–32)
CO2: 33 mEq/L — ABNORMAL HIGH (ref 19–32)
CO2: 35 mEq/L — ABNORMAL HIGH (ref 19–32)
Calcium: 9.4 mg/dL (ref 8.4–10.5)
Chloride: 96 mEq/L (ref 96–112)
Chloride: 99 mEq/L (ref 96–112)
Creatinine, Ser: 0.68 mg/dL (ref 0.4–1.2)
Creatinine, Ser: 0.7 mg/dL (ref 0.4–1.2)
Creatinine, Ser: 0.75 mg/dL (ref 0.4–1.2)
GFR calc Af Amer: >60 mL/min (ref >60)
GFR calc Af Amer: >60 mL/min (ref >60)
GFR calc non Af Amer: >60 mL/min (ref >60)
Glucose, Bld: 126 mg/dL — ABNORMAL HIGH (ref 70–99)
Glucose, Bld: 133 mg/dL — ABNORMAL HIGH (ref 70–99)
Sodium: 138 mEq/L (ref 135–145)

## 2010-11-27 LAB — DIFFERENTIAL
Basophils Absolute: 0 10*3/uL (ref 0.0–0.1)
Basophils Relative: 0 % (ref 0–1)
Basophils Relative: 1 % (ref 0–1)
Eosinophils Absolute: 0.2 10*3/uL (ref 0.0–0.7)
Eosinophils Relative: 3 % (ref 0–5)
Monocytes Relative: 7 % (ref 3–12)
Neutro Abs: 4.1 10*3/uL (ref 1.7–7.7)
Neutrophils Relative %: 63 % (ref 43–77)
Neutrophils Relative %: 67 % (ref 43–77)

## 2010-11-27 LAB — GLUCOSE, CAPILLARY
Glucose-Capillary: 137 mg/dL — ABNORMAL HIGH (ref 70–99)
Glucose-Capillary: 142 mg/dL — ABNORMAL HIGH (ref 70–99)
Glucose-Capillary: 156 mg/dL — ABNORMAL HIGH (ref 70–99)
Glucose-Capillary: 164 mg/dL — ABNORMAL HIGH (ref 70–99)
Glucose-Capillary: 165 mg/dL — ABNORMAL HIGH (ref 70–99)
Glucose-Capillary: 196 mg/dL — ABNORMAL HIGH (ref 70–99)

## 2010-11-27 LAB — URINALYSIS, ROUTINE W REFLEX MICROSCOPIC
Bilirubin Urine: NEGATIVE
Glucose, UA: NEGATIVE mg/dL
Specific Gravity, Urine: 1.017 (ref 1.005–1.030)
Urobilinogen, UA: 0.2 mg/dL (ref 0.0–1.0)
pH: 7.5 (ref 5.0–8.0)

## 2010-11-27 LAB — URINE MICROSCOPIC-ADD ON

## 2010-11-27 LAB — CULTURE, BLOOD (ROUTINE X 2)
Culture: NO GROWTH
Culture: NO GROWTH

## 2010-11-27 LAB — CBC
HCT: 39.3 % (ref 36.0–46.0)
HCT: 44.9 % (ref 36.0–46.0)
Hemoglobin: 13 g/dL (ref 12.0–15.0)
Hemoglobin: 13.3 g/dL (ref 12.0–15.0)
MCHC: 31.7 g/dL (ref 30.0–36.0)
MCHC: 32.7 g/dL (ref 30.0–36.0)
MCHC: 33 g/dL (ref 30.0–36.0)
MCV: 84.8 fL (ref 78.0–100.0)
MCV: 85 fL (ref 78.0–100.0)
Platelets: 241 10*3/uL (ref 150–400)
RBC: 4.69 MIL/uL (ref 3.87–5.11)
RBC: 5.29 MIL/uL — ABNORMAL HIGH (ref 3.87–5.11)
RDW: 17.3 % — ABNORMAL HIGH (ref 11.5–15.5)
RDW: 17.6 % — ABNORMAL HIGH (ref 11.5–15.5)
WBC: 6.5 10*3/uL (ref 4.0–10.5)

## 2010-11-27 LAB — CARDIAC PANEL(CRET KIN+CKTOT+MB+TROPI)
CK, MB: 1.3 ng/mL (ref 0.3–4.0)
CK, MB: 1.4 ng/mL (ref 0.3–4.0)
CK, MB: 1.8 ng/mL (ref 0.3–4.0)
Relative Index: INVALID (ref 0.0–2.5)
Relative Index: INVALID (ref 0.0–2.5)
Relative Index: INVALID (ref 0.0–2.5)
Total CK: 56 U/L (ref 7–177)
Total CK: 57 U/L (ref 7–177)
Troponin I: 0.12 ng/mL — ABNORMAL HIGH (ref 0.00–0.06)
Troponin I: 0.13 ng/mL — ABNORMAL HIGH (ref 0.00–0.06)
Troponin I: 0.13 ng/mL — ABNORMAL HIGH (ref 0.00–0.06)

## 2010-11-27 LAB — TSH: TSH: 5.657 u[IU]/mL — ABNORMAL HIGH (ref 0.350–4.500)

## 2010-11-27 LAB — T4, FREE: Free T4: 1.25 ng/dL (ref 0.89–1.80)

## 2010-11-27 LAB — PROTIME-INR
INR: 1.7 — ABNORMAL HIGH (ref 0.00–1.49)
INR: 2.2 — ABNORMAL HIGH (ref 0.00–1.49)
INR: 2.4 — ABNORMAL HIGH (ref 0.00–1.49)
Prothrombin Time: 26.1 seconds — ABNORMAL HIGH (ref 11.6–15.2)

## 2010-11-27 LAB — LIPID PANEL
Cholesterol: 160 mg/dL (ref 0–200)
LDL Cholesterol: 107 mg/dL — ABNORMAL HIGH (ref 0–99)
Triglycerides: 100 mg/dL (ref <150)

## 2010-11-27 LAB — LIPASE, BLOOD
Lipase: 16 U/L (ref 11–59)
Lipase: 17 U/L (ref 11–59)

## 2010-11-27 LAB — CK TOTAL AND CKMB (NOT AT ARMC)
Relative Index: INVALID (ref 0.0–2.5)
Total CK: 58 U/L (ref 7–177)

## 2010-11-27 LAB — B-NATRIURETIC PEPTIDE (CONVERTED LAB): Pro B Natriuretic peptide (BNP): 192 pg/mL — ABNORMAL HIGH (ref 0.0–100.0)

## 2010-11-27 LAB — POCT CARDIAC MARKERS

## 2010-11-27 LAB — TROPONIN I: Troponin I: 0.16 ng/mL — ABNORMAL HIGH (ref 0.00–0.06)

## 2010-11-27 LAB — URINE CULTURE

## 2010-11-29 LAB — CBC
HCT: 41.1 % (ref 36.0–46.0)
MCV: 84.3 fL (ref 78.0–100.0)
Platelets: 224 10*3/uL (ref 150–400)
Platelets: 237 10*3/uL (ref 150–400)
RDW: 17.3 % — ABNORMAL HIGH (ref 11.5–15.5)
RDW: 17.5 % — ABNORMAL HIGH (ref 11.5–15.5)
WBC: 6.7 10*3/uL (ref 4.0–10.5)
WBC: 7.5 10*3/uL (ref 4.0–10.5)

## 2010-11-29 LAB — BASIC METABOLIC PANEL
BUN: 14 mg/dL (ref 6–23)
BUN: 14 mg/dL (ref 6–23)
Calcium: 9.3 mg/dL (ref 8.4–10.5)
Chloride: 98 mEq/L (ref 96–112)
Creatinine, Ser: 0.71 mg/dL (ref 0.4–1.2)
Creatinine, Ser: 0.8 mg/dL (ref 0.4–1.2)
GFR calc non Af Amer: >60 mL/min (ref >60)
Glucose, Bld: 129 mg/dL — ABNORMAL HIGH (ref 70–99)

## 2010-11-29 LAB — PROTIME-INR
INR: 1.7 — ABNORMAL HIGH (ref 0.00–1.49)
INR: 2.1 — ABNORMAL HIGH (ref 0.00–1.49)
Prothrombin Time: 24.9 seconds — ABNORMAL HIGH (ref 11.6–15.2)

## 2010-11-29 LAB — GLUCOSE, CAPILLARY: Glucose-Capillary: 161 mg/dL — ABNORMAL HIGH (ref 70–99)

## 2010-11-29 LAB — CK TOTAL AND CKMB (NOT AT ARMC)
CK, MB: 2.1 ng/mL (ref 0.3–4.0)
Relative Index: INVALID (ref 0.0–2.5)
Total CK: 57 U/L (ref 7–177)

## 2010-11-29 LAB — OCCULT BLOOD X 1 CARD TO LAB, STOOL: Fecal Occult Bld: NEGATIVE

## 2010-11-29 LAB — TROPONIN I: Troponin I: 0.15 ng/mL — ABNORMAL HIGH (ref 0.00–0.06)

## 2010-11-30 ENCOUNTER — Encounter: Payer: Medicare Other | Admitting: Internal Medicine

## 2010-11-30 ENCOUNTER — Encounter: Payer: Self-pay | Admitting: Internal Medicine

## 2010-11-30 ENCOUNTER — Ambulatory Visit (INDEPENDENT_AMBULATORY_CARE_PROVIDER_SITE_OTHER): Payer: Medicare Other | Admitting: Internal Medicine

## 2010-11-30 DIAGNOSIS — I4891 Unspecified atrial fibrillation: Secondary | ICD-10-CM

## 2010-11-30 DIAGNOSIS — N390 Urinary tract infection, site not specified: Secondary | ICD-10-CM

## 2010-11-30 DIAGNOSIS — I10 Essential (primary) hypertension: Secondary | ICD-10-CM

## 2010-11-30 DIAGNOSIS — E119 Type 2 diabetes mellitus without complications: Secondary | ICD-10-CM

## 2010-11-30 LAB — POCT GLYCOSYLATED HEMOGLOBIN (HGB A1C): Hemoglobin A1C: 9

## 2010-11-30 NOTE — Assessment & Plan Note (Addendum)
Hgb A1c today is 9.0 which up from 7.4 in 08/2010. Patient had significant illness recently which is most likely contributing to the significant elevation of blood glucose. Considering her age and recent sickness I am not starting her on any medication at this point. I will recheck in one month her Hgb A1c . If it continues to be elevated I would consider to start therapy.

## 2010-11-30 NOTE — Progress Notes (Deleted)
  Subjective:    Patient ID: [name], female    DOB: 01/13/1933, 77 y.o.   MRN: [id number]  HPI    Review of Systems     Objective:   Physical Exam        Assessment & Plan:

## 2010-11-30 NOTE — Assessment & Plan Note (Signed)
Blood pressure well controlled on current regimen.  BP Readings from Last 3 Encounters:  11/30/10 125/71  11/14/10 127/72  11/02/10 130/82

## 2010-11-30 NOTE — Assessment & Plan Note (Signed)
INR today 2.0 Continue current regimen. Will see Dr Alexandria Lodge in 2 weeks for a recheck.

## 2010-11-30 NOTE — Assessment & Plan Note (Signed)
Patient was admitted for UTI with uncomplicated pyelonephritis and was discharged on Ceftriaxone IM injection for 7 days which was completed. No further therapy indicated today.

## 2010-11-30 NOTE — Progress Notes (Signed)
Subjective:   Patient ID: [name] female   DOB: February 05, 1933 77 y.o.   MRN: [id number]  HPI: [name] is a 77 y.o. female with PMH significant as outlined below who presented tot he clinic for a hospital follow up. She was admitted on 9/19 for UTI with uncomplicated pyelonephritis. Patient was discharged on Ceftriaxone 250 mg intramuscularly daily for 7 days given by the [facility] Team.  Patient reports that her symptoms improved and she completed her therapy.  She denies any dysuria or burning sensation with urination anymore. She just mentioned that she has the feeling that her abdomen is swollen on certain parts . She reports it started after Reglan was stopped in the hospital.    Past Medical History  Diagnosis Date  . CAD (coronary artery disease)     s/p remote MI (20+ yrs ago)  . Abdominal pain     recent admission, likely secondary to presbyesophagus and gastric dysmotility  . AF (atrial fibrillation)     on chronic warfarin  . CVD (cardiovascular disease)   . Diastolic dysfunction     with dilated cardiomyopathy, last EF 55%(09/2006)  . Respiratory failure, chronic     mixed etiology with bronchospastic component  . Urinary incontinence     recurrent uti's/resistance to cipro, bactrim  . Gout   . Hypothyroidism   . Hyperlipidemia   . Depression   . Osteoarthritis   . Morbidly obese     s/p gastric bypass  . Gastroesophageal reflux disease   . Ventral hernia     repair in April 2008, complicated by MRSA abdominal wall cellulitis  . DVT (deep venous thrombosis)   . Pulmonary embolism     1998, s/p Greenfield filter  . Diabetes mellitus   . Hypertension    Current Outpatient Prescriptions  Medication Sig Dispense Refill  . acetaminophen (TYLENOL) 500 MG tablet Take 500 mg by mouth. Take two tablets by mouth two times a day       . albuterol (PROVENTIL HFA) 108 (90 BASE) MCG/ACT inhaler Inhale 2 puffs into the lungs every 6 (six) hours as needed. 1  to 2 puffs two times a day as needed for wheezing       . amLODipine (NORVASC) 5 MG tablet Take 5 mg by mouth daily.        . colchicine (COLCRYS) 0.6 MG tablet Take 1 tablet (0.6 mg total) by mouth daily.  30 tablet  11  . FLUoxetine (PROZAC) 20 MG capsule Take 1 capsule (20 mg total) by mouth 2 (two) times daily.  62 capsule  6  . Fluticasone-Salmeterol (ADVAIR DISKUS) 250-50 MCG/DOSE AEPB Inhale 1 puff into the lungs 2 (two) times daily.        . furosemide (LASIX) 40 MG tablet TAKE TWO TABLETS BY MOUTH EVERY DAY  60 tablet  6  . levothyroxine (SYNTHROID, LEVOTHROID) 125 MCG tablet Take 1 tablet (125 mcg total) by mouth daily.  31 tablet  3  . LIPITOR 40 MG tablet TAKE ONE TABLET BY MOUTH EVERY DAY  31 each  6  . losartan (COZAAR) 50 MG tablet Take 1 tablet (50 mg total) by mouth 2 (two) times daily.  60 tablet  6  . montelukast (SINGULAIR) 10 MG tablet TAKE ONE TABLET BY MOUTH EVERY DAY  31 tablet  11  . omeprazole (PRILOSEC) 20 MG capsule Take 2 capsules (40 mg total) by mouth 2 (two) times daily.  60 capsule  3  .  polyethylene glycol (MIRALAX) powder Take 17 g by mouth daily. Use as directed       . spironolactone (ALDACTONE) 25 MG tablet Take 1 tablet (25 mg total) by mouth daily.  31 tablet  6  . tiotropium (SPIRIVA) 18 MCG inhalation capsule Place 1 capsule (18 mcg total) into inhaler and inhale daily.  30 capsule  6  . tolterodine (DETROL LA) 2 MG 24 hr capsule Take 2 mg by mouth daily.        Marland Kitchen warfarin (COUMADIN) 5 MG tablet TAKE AS DIRECTED BY MOUTH  31 tablet  3   Review of Systems: Constitutional: Denies fever, chills, diaphoresis, appetite change and fatigue.  Respiratory: Denies SOB, DOE, cough, chest tightness,  and wheezing.   Cardiovascular: Denies chest pain, palpitations and leg swelling.  Gastrointestinal: Denies nausea, vomiting, abdominal pain, diarrhea, constipation, blood in stool but noted abdominal distention.  Genitourinary: Denies dysuria, urgency, frequency,  hematuria, flank pain and difficulty urinating.  Neurological: Denies dizzinessight-headedness, numbness and headaches.    Objective:  Physical Exam: Filed Vitals:   11/30/10 1543  BP: 125/71  Pulse: 72  Temp: 97.5 F (36.4 C)  TempSrc: Oral  Height: 5\' 4"  (1.626 m)  Weight: 308 lb 11.2 oz (140.025 kg)  SpO2: 94%   Constitutional: Vital signs reviewed.  Patient is a well-developed and well-nourished  in no acute distress and cooperative with exam. Alert and oriented x3.  Neck: Supple, Trachea midline normal ROM, No JVD, mass, thyromegaly, or carotid bruit present.  Cardiovascular: RRR, S1 normal, S2 normal, no MRG, pulses symmetric and intact bilaterally Pulmonary/Chest: CTAB, no wheezes, rales, or rhonchi Abdominal: Soft. Non-tender, non-distended, bowel sounds are normal,  GU: no CVA tenderness Neurological: A&O x3,no focal motor deficit, sensory intact to light touch bilaterally.  Skin: Warm, dry and intact. No rash, cyanosis, or clubbing.

## 2010-12-03 NOTE — Discharge Summary (Signed)
[name], [name]              ACCOUNT NO.:  [id number]  MEDICAL RECORD NO.:  [id number]  LOCATION:  3032                         FACILITY:  [facility]  PHYSICIAN:  Edsel Petrin, D.O.DATE OF BIRTH:  [birth year]  DATE OF ADMISSION:  11/14/2010 DATE OF DISCHARGE:  11/16/2010                              DISCHARGE SUMMARY   DISCHARGE DIAGNOSES: 1. Urinary tract infection with uncomplicated pyelonephritis. 2. Hypertension. 3. Chronic obstructive pulmonary disease. 4. Gout. 5. Hypothyroidism. 6. Diabetes mellitus type 2. 7. Depression. 8. Urinary incontinence with recurrent urinary tract infections. 9. Gastroesophageal reflux disease. 10.Allergies. 11.Congestive heart failure. 12.Osteoarthritis. 13.Atrial fibrillation. 14.Morbid obesity. 15.Hyperlipidemia.  DISCHARGE MEDICATIONS: 1. Ceftriaxone 250 mg intramuscularly daily for 7 days to be administered by short-stay. 2. Advair Diskus 1 puff inhaled twice daily. 3. Amlodipine 5 mg by mouth daily. 4. Albuterol 1-2 puffs inhaled every 4 hours as needed. 5. Colchicine 0.5 mg by mouth daily as needed. 6. Coumadin 5 mg by mouth daily. 7. Furosemide 40 mg by mouth twice daily. 8. Levothyroxine 150 mcg by mouth before breakfast daily. 9. Atorvastatin by mouth daily. 10.Losartan 50 mg by mouth twice daily. 11.Lunesta 1 mg by mouth daily at bedtime as needed. 12.MiraLax 17 grams by mouth daily as needed. 13.Omeprazole 20 mg by mouth twice daily. 14.Metoclopramide 5 mg by mouth 3 times daily. 15.Singulair 10 mg by mouth daily. 16.Spiriva 18 mcg 1 puff inhaled daily. 17.Spironolactone 25 mg by mouth daily. 18.Vitamin B12 one tablet by mouth daily.  DISPOSITION AND FOLLOWUP:  The patient has an appointment with Dr. Almyra Deforest on November 30, 2010 at 8:45 a.m. at [facility].   1. During this appointment, she will be evaluated for resolution of her UTI.  2. Dr Loistine Chance will also find out about  compliance with intramuscular  injections of ceftriaxone and how she is managing to keep her perineum clean and dry at home.  3.Dr. Loistine Chance will check her CBG. Of note, She has been successfully managing her DM with diet and exercise.  PROCEDURES PERFORMED:  None.  CONSULTATIONS:  None.  BRIEF HISTORY AND PHYSICAL:  The patient is a 77 year old woman with a history of multiple UTIs, hypothyroidism, DM, hyperlipidemia, hypertension, CHF, COPD, atrial fibrillation, and a prior history of CVAs who presented with anuria.  On November 06, 2010  she had presented to ED with constipation at which time  she was successfully treated. However, her urinalysis showed a UTI  and she was given 1 dose of Macrobid and then discharged on Keflex 500 mg b.i.d. for 7 days which the patient has completed.  However, urine cultures came back later showing E. coli sensitive to only ceftriaxone, gentamycin, and tobramycin and when the patient was seen in the clinic, she reported history of confusion over the last several days associated with pain in her abdomen and reduced urine output. Her son reported the night  before admission and on the morning of admission, the patient's diapers were dry when he changed them.  There was no history of chills or fevers.  Nonausea and no vomiting.  She reported occasional abdominal pain when she is constipated but no abdominal or flank pain while in the  clinic.  She had 8 urinary tract infections in the last 12 months.  PHYSICAL EXAMINATION:  VITAL SIGNS: BP 127/72, pulse 78, temperature 97.2, respiratory rate 20, and she was on oxygen which she uses normally at home. GENERAL:  The patient appeared alert, cooperative, but slightly confused. ABDOMEN:  The rest of the exam was unremarkable except for abdominal examination which showed tenderness on deep palpation of the left lower quadrant and costovertebral angle tenderness bilaterally. BACK:  Remarkable for tenderness  on the back versus costovertebral angle tenderness. EXTREMITIES:  chronic nonpitting edema. NEUROLOGIC:  The patient was oriented to person, place, and day of the week.  Cranial nerves were intact.  LABORATORY DATA:  CBC:  WBC 9.5, hemoglobin 14.5, hematocrit 43.4, and platelets 190.  PT/INR 19.1/1.57.  BMET:  Sodium 134, potassium 4.3, chloride 94, bicarb 32, BUN 33, creatinine 1.42.   abdomen and kidney U/S showed small slightly atrophic kidneys without evidence of hydronephrosis or renal abscess.  HOSPITAL COURSE BY PROBLEMS: 1. Urinary tract infection with uncomplicated pyelonephritis.     This patient condition has signoficantly improved after IV antibiotics.    Given the h/o urinary incontinence, she is counseled on maintaining her     perineum clean and dry while at home and frequent changing of her     diapers to avoid infection of her urinary tract.  This is further     explained to the son whom I talk to over the phone      as this is noted to be likely the main source of her recurrent     urinary tract infections.  The patient will continue to receive     intramuscular injections of ceftriaxone 250 mg once a day for 7     days by short stay unit. She will likely not need long-term     antibiotic prophylaxis as this will likely result in      more resistant microorganisms. The main strategy is to keep the     patient clean.  Her treatment with Detrol has been discontinued. 2. The patient has a number of other chronic medical conditions which are stable     during this admission.  DISCHARGE VITAL SIGNS:  Temperature 98.0, pulse 64, respirations 22, blood pressure 160/64, oxygen saturation 97 on 2 L of oxygen.  DISCHARGE LABORATORY DATA:  PT/INR 23.5/2.05.  BMET:  Sodium 138, potassium 3.8, chloride 99, bicarb 32, BUN 30, creatinine 1.16, and glucose 211.  CONDITION ON DISCHARGE:  The patient was stable and she was fit to  be discharged.    ______________________________ Dede Query, MD   ______________________________ Edsel Petrin, D.O.    NL/MEDQ  D:  11/16/2010  T:  11/16/2010  Job:  [id number]  cc:   Carrolyn Meiers, MD Almyra Deforest, MD  Electronically Signed by Dede Query MD on 11/26/2010 05:52:01 AM Electronically Signed by Anderson Malta D.O. on 12/03/2010 04:56:23 PM

## 2010-12-04 LAB — COMPREHENSIVE METABOLIC PANEL
Albumin: 3.4 — ABNORMAL LOW
Alkaline Phosphatase: 105
BUN: 12
CO2: 30
Calcium: 9
Chloride: 95 — ABNORMAL LOW
GFR calc Af Amer: >60
Total Protein: 7

## 2010-12-04 LAB — CBC
HCT: 33.1 — ABNORMAL LOW
HCT: 32.2 — ABNORMAL LOW
HCT: 33 — ABNORMAL LOW
HCT: 33.2 — ABNORMAL LOW
HCT: 34.4 — ABNORMAL LOW
HCT: 36.3
HCT: 41.5
Hemoglobin: 10.7 — ABNORMAL LOW
Hemoglobin: 11.8 — ABNORMAL LOW
Hemoglobin: 10.5 — ABNORMAL LOW
Hemoglobin: 10.8 — ABNORMAL LOW
Hemoglobin: 10.9 — ABNORMAL LOW
Hemoglobin: 11.2 — ABNORMAL LOW
Hemoglobin: 11.8 — ABNORMAL LOW
Hemoglobin: 13.7
MCHC: 32.4
MCHC: 32.5
MCHC: 32.5
MCHC: 32.7
MCHC: 32.7
MCHC: 32.7
MCV: 82.1
MCV: 81.6
MCV: 81.9
MCV: 82.2
MCV: 82.2
MCV: 82.3
MCV: 82.6
Platelets: 222
Platelets: 229
Platelets: 231
Platelets: 260
Platelets: 260
Platelets: 265
RBC: 4.07
RBC: 4.44
RBC: 3.93
RBC: 4.02
RBC: 4.02
RBC: 4.19
RBC: 4.42
RDW: 16.4 — ABNORMAL HIGH
RDW: 16.3 — ABNORMAL HIGH
RDW: 16.5 — ABNORMAL HIGH
RDW: 16.8 — ABNORMAL HIGH
RDW: 17.1 — ABNORMAL HIGH
RDW: 17.2 — ABNORMAL HIGH
RDW: 17.3 — ABNORMAL HIGH
WBC: 7
WBC: 11.6 — ABNORMAL HIGH
WBC: 17.7 — ABNORMAL HIGH
WBC: 6.4
WBC: 8.1
WBC: 8.8

## 2010-12-04 LAB — CULTURE, BLOOD (ROUTINE X 2): Culture: NO GROWTH

## 2010-12-04 LAB — URINALYSIS, ROUTINE W REFLEX MICROSCOPIC
Bilirubin Urine: NEGATIVE
Glucose, UA: NEGATIVE
Hgb urine dipstick: NEGATIVE
Ketones, ur: NEGATIVE
Nitrite: NEGATIVE
Protein, ur: NEGATIVE
Specific Gravity, Urine: 1.025
Urobilinogen, UA: 0.2
pH: 6

## 2010-12-04 LAB — DIFFERENTIAL
Basophils Absolute: 0.3 — ABNORMAL HIGH
Basophils Relative: 2 — ABNORMAL HIGH
Eosinophils Relative: 0
Lymphocytes Relative: 6 — ABNORMAL LOW
Lymphs Abs: 1.8
Lymphs Abs: 1.3
Monocytes Absolute: 0.5
Monocytes Absolute: 0.6
Monocytes Relative: 7
Monocytes Relative: 3
Neutro Abs: 4.3
Neutro Abs: 18.4 — ABNORMAL HIGH

## 2010-12-04 LAB — LIPID PANEL
HDL: 38 — ABNORMAL LOW
Total CHOL/HDL Ratio: 2.6
Triglycerides: 57
VLDL: 11

## 2010-12-04 LAB — PROTIME-INR
INR: 2.5 — ABNORMAL HIGH
INR: 2.6 — ABNORMAL HIGH
INR: 2.1 — ABNORMAL HIGH
INR: 2.3 — ABNORMAL HIGH
INR: 2.3 — ABNORMAL HIGH
INR: 2.4 — ABNORMAL HIGH
INR: 2.5 — ABNORMAL HIGH
Prothrombin Time: 24.2 — ABNORMAL HIGH
Prothrombin Time: 25.9 — ABNORMAL HIGH
Prothrombin Time: 26.8 — ABNORMAL HIGH
Prothrombin Time: 27.9 — ABNORMAL HIGH

## 2010-12-04 LAB — URINE MICROSCOPIC-ADD ON

## 2010-12-04 LAB — BLOOD GAS, ARTERIAL
Bicarbonate: 30.2 — ABNORMAL HIGH
O2 Content: 2
O2 Saturation: 96
Patient temperature: 98.8
TCO2: 31.5
pH, Arterial: 7.462 — ABNORMAL HIGH
pO2, Arterial: 80.3

## 2010-12-04 LAB — GRAM STAIN

## 2010-12-04 LAB — CARDIAC PANEL(CRET KIN+CKTOT+MB+TROPI)
CK, MB: 1.6
CK, MB: 1.9
Relative Index: INVALID
Relative Index: INVALID
Total CK: 31
Troponin I: 0.19 — ABNORMAL HIGH
Troponin I: 0.19 — ABNORMAL HIGH

## 2010-12-04 LAB — COMPREHENSIVE METABOLIC PANEL WITH GFR
ALT: 15
AST: 20
Albumin: 3.4 — ABNORMAL LOW
Alkaline Phosphatase: 101
BUN: 14
CO2: 34 — ABNORMAL HIGH
Calcium: 9.7
Chloride: 96
Creatinine, Ser: 0.78
GFR calc non Af Amer: >60
Glucose, Bld: 149 — ABNORMAL HIGH
Potassium: 3.6
Sodium: 138
Total Bilirubin: 0.7
Total Protein: 7.2

## 2010-12-04 LAB — BASIC METABOLIC PANEL
BUN: 15
BUN: 18
BUN: 19
CO2: 33 — ABNORMAL HIGH
CO2: 29
CO2: 34 — ABNORMAL HIGH
Calcium: 8.9
Calcium: 9.1
Chloride: 96
Chloride: 96
Chloride: 97
Creatinine, Ser: 0.72
GFR calc Af Amer: >60
GFR calc Af Amer: >60
GFR calc non Af Amer: >60
GFR calc non Af Amer: >60
Glucose, Bld: 133 — ABNORMAL HIGH
Glucose, Bld: 124 — ABNORMAL HIGH
Glucose, Bld: 181 — ABNORMAL HIGH
Potassium: 3.7
Potassium: 3.3 — ABNORMAL LOW
Potassium: 3.8
Potassium: 4.1
Sodium: 136
Sodium: 135
Sodium: 136
Sodium: 139

## 2010-12-04 LAB — BASIC METABOLIC PANEL WITH GFR
BUN: 17
CO2: 30
Calcium: 8.9
Chloride: 99
Creatinine, Ser: 0.61
GFR calc non Af Amer: >60
Glucose, Bld: 208 — ABNORMAL HIGH
Potassium: 4.3
Sodium: 135

## 2010-12-04 LAB — TSH: TSH: 0.562

## 2010-12-04 LAB — URINE CULTURE
Colony Count: NO GROWTH
Culture: NO GROWTH

## 2010-12-04 LAB — VANCOMYCIN, TROUGH
Vancomycin Tr: 15.4
Vancomycin Tr: 24.9 — ABNORMAL HIGH

## 2010-12-04 LAB — MAGNESIUM: Magnesium: 1.8

## 2010-12-04 LAB — APTT: aPTT: 46 — ABNORMAL HIGH

## 2010-12-07 LAB — CBC
Platelets: 388
WBC: 9.3

## 2010-12-07 LAB — PROTIME-INR
INR: 2.8 — ABNORMAL HIGH
Prothrombin Time: 30.6 — ABNORMAL HIGH

## 2010-12-07 LAB — DIFFERENTIAL
Lymphocytes Relative: 23
Lymphs Abs: 2.2
Neutrophils Relative %: 66

## 2010-12-10 ENCOUNTER — Ambulatory Visit (INDEPENDENT_AMBULATORY_CARE_PROVIDER_SITE_OTHER): Payer: Medicare Other | Admitting: Pharmacist

## 2010-12-10 DIAGNOSIS — I4891 Unspecified atrial fibrillation: Secondary | ICD-10-CM

## 2010-12-10 DIAGNOSIS — Z7901 Long term (current) use of anticoagulants: Secondary | ICD-10-CM

## 2010-12-10 DIAGNOSIS — R5383 Other fatigue: Secondary | ICD-10-CM

## 2010-12-10 LAB — WOUND CULTURE

## 2010-12-10 LAB — CBC
HCT: 34.6 — ABNORMAL LOW
HCT: 35.6 — ABNORMAL LOW
HCT: 37.6
HCT: 42.6
Hemoglobin: 11 — ABNORMAL LOW
Hemoglobin: 11.4 — ABNORMAL LOW
Hemoglobin: 11.7 — ABNORMAL LOW
Hemoglobin: 12.6
Hemoglobin: 14.3
MCHC: 32.4
MCHC: 32.7
MCHC: 33.5
MCHC: 33.5
MCV: 81.5
MCV: 81.9
MCV: 82.8
MCV: 83
Platelets: 226
Platelets: 244
Platelets: 250
Platelets: 299
RBC: 3.91
RBC: 4.04
RBC: 4.19
RBC: 4.29
RBC: 4.59
RBC: 5.23 — ABNORMAL HIGH
RDW: 17.2 — ABNORMAL HIGH
RDW: 17.2 — ABNORMAL HIGH
RDW: 17.3 — ABNORMAL HIGH
WBC: 15.4 — ABNORMAL HIGH
WBC: 27.2 — ABNORMAL HIGH
WBC: 6.2
WBC: 8.6
WBC: 9.3
WBC: 9.5

## 2010-12-10 LAB — DIFFERENTIAL
Basophils Absolute: 0
Basophils Relative: 0
Eosinophils Absolute: 0
Eosinophils Relative: 0
Lymphocytes Relative: 5 — ABNORMAL LOW
Lymphs Abs: 1.4
Monocytes Absolute: 0.8 — ABNORMAL HIGH
Monocytes Relative: 3
Neutro Abs: 25 — ABNORMAL HIGH
Neutrophils Relative %: 92 — ABNORMAL HIGH

## 2010-12-10 LAB — BASIC METABOLIC PANEL
BUN: 11
BUN: 11
CO2: 31
CO2: 32
Calcium: 8.6
Calcium: 8.7
Calcium: 9
Chloride: 100
Chloride: 102
Chloride: 103
Creatinine, Ser: 0.58
Creatinine, Ser: 0.68
GFR calc Af Amer: >60
GFR calc Af Amer: >60
GFR calc Af Amer: >60
GFR calc Af Amer: >60
GFR calc Af Amer: >60
GFR calc non Af Amer: 58 — ABNORMAL LOW
GFR calc non Af Amer: >60
GFR calc non Af Amer: >60
GFR calc non Af Amer: >60
GFR calc non Af Amer: >60
Potassium: 3.3 — ABNORMAL LOW
Potassium: 3.5
Potassium: 3.9
Sodium: 137
Sodium: 138
Sodium: 139
Sodium: 140
Sodium: 141

## 2010-12-10 LAB — TSH: TSH: 1.03

## 2010-12-10 LAB — CARDIAC PANEL(CRET KIN+CKTOT+MB+TROPI)
CK, MB: 1.1
CK, MB: 1.2
Relative Index: INVALID
Relative Index: INVALID
Total CK: 32
Total CK: 35
Troponin I: 0.14 — ABNORMAL HIGH
Troponin I: 0.14 — ABNORMAL HIGH

## 2010-12-10 LAB — CULTURE, BLOOD (ROUTINE X 2)
Culture: NO GROWTH
Culture: NO GROWTH

## 2010-12-10 LAB — URINALYSIS, ROUTINE W REFLEX MICROSCOPIC
Glucose, UA: NEGATIVE
Ketones, ur: 15 — AB
Nitrite: NEGATIVE
Protein, ur: NEGATIVE
Specific Gravity, Urine: 1.026
Urobilinogen, UA: 1
pH: 6

## 2010-12-10 LAB — PROTIME-INR
INR: 1.5
INR: 1.5
INR: 2.6 — ABNORMAL HIGH
INR: 2.8 — ABNORMAL HIGH
INR: 3.1 — ABNORMAL HIGH
Prothrombin Time: 18.2 — ABNORMAL HIGH
Prothrombin Time: 18.7 — ABNORMAL HIGH
Prothrombin Time: 28.8 — ABNORMAL HIGH

## 2010-12-10 LAB — URINE CULTURE: Colony Count: >=100000

## 2010-12-10 LAB — COMPREHENSIVE METABOLIC PANEL WITH GFR
Albumin: 3.2 — ABNORMAL LOW
Alkaline Phosphatase: 118 — ABNORMAL HIGH
BUN: 11
CO2: 30
Chloride: 95 — ABNORMAL LOW
Creatinine, Ser: 0.92
GFR calc non Af Amer: >60
Potassium: 3.8
Total Bilirubin: 0.7

## 2010-12-10 LAB — COMPREHENSIVE METABOLIC PANEL
ALT: 20
AST: 25
Calcium: 9.4
GFR calc Af Amer: >60
Glucose, Bld: 182 — ABNORMAL HIGH
Sodium: 137
Total Protein: 6.8

## 2010-12-10 LAB — BASIC METABOLIC PANEL WITH GFR
BUN: 16
Creatinine, Ser: 1.01
GFR calc non Af Amer: 54 — ABNORMAL LOW
Glucose, Bld: 123 — ABNORMAL HIGH
Potassium: 3.4 — ABNORMAL LOW

## 2010-12-10 LAB — HEMOGLOBIN A1C
Hgb A1c MFr Bld: 7.1 — ABNORMAL HIGH
Mean Plasma Glucose: 175

## 2010-12-10 LAB — CK TOTAL AND CKMB (NOT AT ARMC)
CK, MB: 1.2
Relative Index: INVALID
Total CK: 34

## 2010-12-10 LAB — URINE MICROSCOPIC-ADD ON

## 2010-12-10 LAB — POCT CARDIAC MARKERS
CKMB, poc: <1 — ABNORMAL LOW
Myoglobin, poc: 130
Operator id: 272551
Troponin i, poc: 0.05

## 2010-12-10 LAB — CLOSTRIDIUM DIFFICILE EIA
C difficile Toxins A+B, EIA: NEGATIVE
C difficile Toxins A+B, EIA: NEGATIVE

## 2010-12-10 LAB — TROPONIN I: Troponin I: 0.23 — ABNORMAL HIGH

## 2010-12-10 NOTE — Patient Instructions (Signed)
Patient instructed to take medications as defined in the Anti-coagulation Track section of this encounter.  Patient instructed to take today's dose.  Patient verbalized understanding of these instructions.    

## 2010-12-10 NOTE — Progress Notes (Signed)
Anti-Coagulation Progress Note  [name] is a 76 y.o. female who is currently on an anti-coagulation regimen.    RECENT RESULTS: Recent results are below, the most recent result is correlated with a dose of 32.5 mg. per week: Lab Results  Component Value Date   INR 2.70 12/10/2010   INR 2.0 11/30/2010   INR 2.05* 11/16/2010    ANTI-COAG DOSE:   Latest dosing instructions   Total Sun Mon Tue Wed Thu Fri Sat   32.5 2.5 mg 5 mg 5 mg 5 mg 5 mg 5 mg 5 mg    (5 mg0.5) (5 mg1) (5 mg1) (5 mg1) (5 mg1) (5 mg1) (5 mg1)         ANTICOAG SUMMARY: Anticoagulation Episode Summary              Current INR goal 2.0-3.0 Next INR check 01/07/2011   INR from last check 2.70 (12/10/2010)     Weekly max dose (mg)  Target end date Indefinite   Indications Long-term (current) use of anticoagulants, AF (atrial fibrillation)   INR check location  Preferred lab    Send INR reminders to [facility]   Comments        Provider Role Specialty Phone number   Ulyess Mort  Internal Medicine [phone]        ANTICOAG TODAY: Anticoagulation Summary as of 12/10/2010              INR goal 2.0-3.0     Selected INR 2.70 (12/10/2010) Next INR check 01/07/2011   Weekly max dose (mg)  Target end date Indefinite   Indications Long-term (current) use of anticoagulants, AF (atrial fibrillation)    Anticoagulation Episode Summary              INR check location  Preferred lab    Send INR reminders to [facility]   Comments        Provider Role Specialty Phone number   Ulyess Mort  Internal Medicine [phone]        PATIENT INSTRUCTIONS: Patient Instructions  Patient instructed to take medications as defined in the Anti-coagulation Track section of this encounter.  Patient instructed to take today's dose.  Patient verbalized understanding of these instructions.        FOLLOW-UP Return in 4 weeks (on 01/07/2011) for Follow up INR.  Hulen Luster, III Pharm.D.,  CACP

## 2010-12-26 ENCOUNTER — Other Ambulatory Visit: Payer: Self-pay | Admitting: Internal Medicine

## 2010-12-30 ENCOUNTER — Other Ambulatory Visit: Payer: Self-pay | Admitting: Internal Medicine

## 2011-01-07 ENCOUNTER — Ambulatory Visit (INDEPENDENT_AMBULATORY_CARE_PROVIDER_SITE_OTHER): Payer: Medicare Other | Admitting: Pharmacist

## 2011-01-07 DIAGNOSIS — I4891 Unspecified atrial fibrillation: Secondary | ICD-10-CM

## 2011-01-07 DIAGNOSIS — Z7901 Long term (current) use of anticoagulants: Secondary | ICD-10-CM

## 2011-01-07 LAB — POCT INR: INR: 2.8

## 2011-01-07 NOTE — Patient Instructions (Signed)
Patient instructed to take medications as defined in the Anti-coagulation Track section of this encounter.  Patient instructed to take today's dose.  Patient verbalized understanding of these instructions.    

## 2011-01-07 NOTE — Progress Notes (Signed)
Anti-Coagulation Progress Note  [name] is a 77 y.o. female who is currently on an anti-coagulation regimen.    RECENT RESULTS: Recent results are below, the most recent result is correlated with a dose of 32.5 mg. per week: Lab Results  Component Value Date   INR 2.80 01/07/2011   INR 2.70 12/10/2010   INR 2.0 11/30/2010    ANTI-COAG DOSE:   Latest dosing instructions   Total Sun Mon Tue Wed Thu Fri Sat   30 5 mg 2.5 mg 5 mg 5 mg 2.5 mg 5 mg 5 mg    (5 mg1) (5 mg0.5) (5 mg1) (5 mg1) (5 mg0.5) (5 mg1) (5 mg1)         ANTICOAG SUMMARY: Anticoagulation Episode Summary              Current INR goal 2.0-3.0 Next INR check 02/04/2011   INR from last check 2.80 (01/07/2011)     Weekly max dose (mg)  Target end date Indefinite   Indications Long-term (current) use of anticoagulants, AF (atrial fibrillation)   INR check location  Preferred lab    Send INR reminders to [facility]   Comments        Provider Role Specialty Phone number   Ulyess Mort  Internal Medicine [phone]        ANTICOAG TODAY: Anticoagulation Summary as of 01/07/2011              INR goal 2.0-3.0     Selected INR 2.80 (01/07/2011) Next INR check 02/04/2011   Weekly max dose (mg)  Target end date Indefinite   Indications Long-term (current) use of anticoagulants, AF (atrial fibrillation)    Anticoagulation Episode Summary              INR check location  Preferred lab    Send INR reminders to [facility]   Comments        Provider Role Specialty Phone number   Ulyess Mort  Internal Medicine [phone]        PATIENT INSTRUCTIONS: Patient Instructions  Patient instructed to take medications as defined in the Anti-coagulation Track section of this encounter.  Patient instructed to take today's dose.  Patient verbalized understanding of these instructions.        FOLLOW-UP Return in 4 weeks (on 02/04/2011) for Follow up INR.  Hulen Luster, III Pharm.D.,  CACP

## 2011-01-08 ENCOUNTER — Encounter (INDEPENDENT_AMBULATORY_CARE_PROVIDER_SITE_OTHER): Payer: Medicare Other | Admitting: Ophthalmology

## 2011-01-08 DIAGNOSIS — H3530 Unspecified macular degeneration: Secondary | ICD-10-CM

## 2011-01-08 DIAGNOSIS — H01009 Unspecified blepharitis unspecified eye, unspecified eyelid: Secondary | ICD-10-CM

## 2011-01-08 DIAGNOSIS — H43819 Vitreous degeneration, unspecified eye: Secondary | ICD-10-CM

## 2011-01-08 DIAGNOSIS — H353290 Exudative age-related macular degeneration, unspecified eye, stage unspecified: Secondary | ICD-10-CM

## 2011-01-15 ENCOUNTER — Telehealth: Payer: Self-pay | Admitting: *Deleted

## 2011-01-15 NOTE — Telephone Encounter (Signed)
Pt requests a refill on her Ambien 10 mg 1 tablet at bedtime.  Dispense # [id number] last filled on 04/25/2010. Angelina Ok, RN 01/15/2011 10:01 AM

## 2011-01-16 ENCOUNTER — Telehealth: Payer: Self-pay | Admitting: *Deleted

## 2011-01-21 ENCOUNTER — Other Ambulatory Visit: Payer: Self-pay | Admitting: *Deleted

## 2011-01-21 MED ORDER — ZOLPIDEM TARTRATE 10 MG PO TABS: 10.0000 mg | ORAL_TABLET | Freq: Every evening | ORAL | Status: DC | PRN

## 2011-01-21 NOTE — Telephone Encounter (Signed)
Done.  Angelina Ok, RN 01/21/2011 12:02 PM

## 2011-01-21 NOTE — Telephone Encounter (Signed)
[name],     Please send refill requests as refill requests.  I do not know how to refill from a phone note.

## 2011-01-21 NOTE — Telephone Encounter (Signed)
Refill for Ambien was called to the [facility].  Angelina Ok, RN 01/21/2011 3:53 PM

## 2011-01-22 ENCOUNTER — Other Ambulatory Visit: Payer: Self-pay | Admitting: Internal Medicine

## 2011-01-24 ENCOUNTER — Other Ambulatory Visit: Payer: Self-pay | Admitting: Internal Medicine

## 2011-01-25 ENCOUNTER — Ambulatory Visit (INDEPENDENT_AMBULATORY_CARE_PROVIDER_SITE_OTHER): Payer: Medicare Other | Admitting: Internal Medicine

## 2011-01-25 ENCOUNTER — Encounter: Payer: Self-pay | Admitting: Internal Medicine

## 2011-01-25 VITALS — BP 130/62 | HR 59 | Temp 97.0°F | Wt 303.3 lb

## 2011-01-25 DIAGNOSIS — N390 Urinary tract infection, site not specified: Secondary | ICD-10-CM

## 2011-01-25 DIAGNOSIS — I509 Heart failure, unspecified: Secondary | ICD-10-CM

## 2011-01-25 DIAGNOSIS — I4891 Unspecified atrial fibrillation: Secondary | ICD-10-CM

## 2011-01-25 LAB — GLUCOSE, CAPILLARY: Glucose-Capillary: 234 mg/dL — ABNORMAL HIGH (ref 70–99)

## 2011-01-25 MED ORDER — SULFAMETHOXAZOLE-TRIMETHOPRIM 400-80 MG PO TABS: 1.0000 | ORAL_TABLET | Freq: Two times a day (BID) | ORAL | Status: AC

## 2011-01-25 NOTE — Assessment & Plan Note (Signed)
Chronic dyspnea unchanged.  Nothing new.

## 2011-01-25 NOTE — Progress Notes (Signed)
17 woman with many chronic problems aggravated by morbid obesity.  Cheerful today.  Does complain of left chest pain, not consistently exertional.  Occurs at night and with sitting.   Not pleuritic.  no fever, new cough, or overall increase in chronic dyspnea. Uses home O2 at 2 lpm.  O2 sat here today is 92% on 2 litres.     . Cor tones distant; irregular.  No murmur. Lungs clear.  Probably has chronic edema but no clear pitting.  Reviewed meds.  2-3 missing but she will call back with these. BP = 130/62, irregular. Says she has voracious appetite but "eats little". Has dysuria.  Not allergic to penicillin or sulfa.  Will try to collect UA and C&S before she leaves.  Will try bactrim DS bid.

## 2011-01-25 NOTE — Progress Notes (Signed)
Addended by: Remus Blake on: 01/25/2011 01:07 PM   Modules accepted: Orders

## 2011-01-28 ENCOUNTER — Telehealth: Payer: Self-pay | Admitting: *Deleted

## 2011-01-28 ENCOUNTER — Other Ambulatory Visit: Payer: Medicare Other

## 2011-01-28 NOTE — Telephone Encounter (Signed)
Pt is currently getting Miralax in the packets the Pharmacy said that getting it in Bulk size container 527 Gm would be less expensive for the patient. Angelina Ok, RN 01/28/2011 10:41 AM

## 2011-01-29 NOTE — Telephone Encounter (Signed)
Please send this to me as a refill request.  I do not know how to enter it.

## 2011-01-31 NOTE — Telephone Encounter (Signed)
Spoke with Dr Alexandria Lodge regarding pt's coumadin as she is currently taking bactrim to treat UTI symptoms. Pt at first stated that she had already taken her coumadin for today and then realized she actually has not taken it.  Instructions were given to pt for her to hold her coumadin for today 12/6 and tomorrow 12/7.   She will resume her regular dose on Sat 12/8 and follow-up in the coumadin clinic on Mon 12/10 per Dr Alexandria Lodge.  Pt verbalized understanding, phone call complete.[name] Spittle Cassady12/6/20121:30 PM

## 2011-01-31 NOTE — Telephone Encounter (Signed)
Contacted pharmacy about Miralax and they already have pt taking the bulk size container dose of 527 g.  Dose is also on chart, pt already had refills on rx. No additional work needed.  Request complete.Criss Alvine, Zacary Bauer Cassady12/6/201211:04 AM

## 2011-02-04 ENCOUNTER — Ambulatory Visit (INDEPENDENT_AMBULATORY_CARE_PROVIDER_SITE_OTHER): Payer: Medicare Other | Admitting: Pharmacist

## 2011-02-04 DIAGNOSIS — I4891 Unspecified atrial fibrillation: Secondary | ICD-10-CM

## 2011-02-04 DIAGNOSIS — Z7901 Long term (current) use of anticoagulants: Secondary | ICD-10-CM

## 2011-02-04 NOTE — Patient Instructions (Signed)
Patient instructed to take medications as defined in the Anti-coagulation Track section of this encounter.  Patient instructed to take today's dose.  Patient verbalized understanding of these instructions.    

## 2011-02-04 NOTE — Progress Notes (Signed)
Anti-Coagulation Progress Note  GRACI HULCE is a 77 y.o. female who is currently on an anti-coagulation regimen.    RECENT RESULTS: Recent results are below, the most recent result is correlated with a dose of 30 mg. per week: Lab Results  Component Value Date   INR 1.90 02/04/2011   INR 2.0 01/25/2011   INR 2.80 01/07/2011    ANTI-COAG DOSE:   Latest dosing instructions   Total Sun Mon Tue Wed Thu Fri Sat   35 5 mg 5 mg 5 mg 5 mg 5 mg 5 mg 5 mg    (5 mg1) (5 mg1) (5 mg1) (5 mg1) (5 mg1) (5 mg1) (5 mg1)         ANTICOAG SUMMARY: Anticoagulation Episode Summary              Current INR goal 2.0-3.0 Next INR check 03/04/2011   INR from last check 1.90! (02/04/2011)     Weekly max dose (mg)  Target end date Indefinite   Indications Long-term (current) use of anticoagulants, AF (atrial fibrillation)   INR check location  Preferred lab    Send INR reminders to [facility]   Comments        Provider Role Specialty Phone number   Ulyess Mort  Internal Medicine [phone]        ANTICOAG TODAY: Anticoagulation Summary as of 02/04/2011              INR goal 2.0-3.0     Selected INR 1.90! (02/04/2011) Next INR check 03/04/2011   Weekly max dose (mg)  Target end date Indefinite   Indications Long-term (current) use of anticoagulants, AF (atrial fibrillation)    Anticoagulation Episode Summary              INR check location  Preferred lab    Send INR reminders to [facility]   Comments        Provider Role Specialty Phone number   Ulyess Mort  Internal Medicine [phone]        PATIENT INSTRUCTIONS: Patient Instructions  Patient instructed to take medications as defined in the Anti-coagulation Track section of this encounter.  Patient instructed to take today's dose.  Patient verbalized understanding of these instructions.        FOLLOW-UP Return in 4 weeks (on 03/04/2011) for Follow up INR.  Hulen Luster, III Pharm.D., CACP

## 2011-02-10 ENCOUNTER — Emergency Department (HOSPITAL_COMMUNITY): Payer: Medicare Other

## 2011-02-10 ENCOUNTER — Encounter (HOSPITAL_COMMUNITY): Payer: Self-pay | Admitting: *Deleted

## 2011-02-10 ENCOUNTER — Emergency Department (HOSPITAL_COMMUNITY)
Admission: EM | Admit: 2011-02-10 | Discharge: 2011-02-10 | Disposition: A | Discharge status: Home / Self Care | Payer: Medicare Other | Attending: Emergency Medicine | Admitting: Emergency Medicine

## 2011-02-10 DIAGNOSIS — Z79899 Other long term (current) drug therapy: Secondary | ICD-10-CM | POA: Insufficient documentation

## 2011-02-10 DIAGNOSIS — Z862 Personal history of diseases of the blood and blood-forming organs and certain disorders involving the immune mechanism: Secondary | ICD-10-CM | POA: Insufficient documentation

## 2011-02-10 DIAGNOSIS — Z86718 Personal history of other venous thrombosis and embolism: Secondary | ICD-10-CM | POA: Insufficient documentation

## 2011-02-10 DIAGNOSIS — E785 Hyperlipidemia, unspecified: Secondary | ICD-10-CM | POA: Insufficient documentation

## 2011-02-10 DIAGNOSIS — S82839A Other fracture of upper and lower end of unspecified fibula, initial encounter for closed fracture: Secondary | ICD-10-CM

## 2011-02-10 DIAGNOSIS — K219 Gastro-esophageal reflux disease without esophagitis: Secondary | ICD-10-CM | POA: Insufficient documentation

## 2011-02-10 DIAGNOSIS — X58XXXA Exposure to other specified factors, initial encounter: Secondary | ICD-10-CM | POA: Insufficient documentation

## 2011-02-10 DIAGNOSIS — I2510 Atherosclerotic heart disease of native coronary artery without angina pectoris: Secondary | ICD-10-CM | POA: Insufficient documentation

## 2011-02-10 DIAGNOSIS — S8263XA Displaced fracture of lateral malleolus of unspecified fibula, initial encounter for closed fracture: Secondary | ICD-10-CM | POA: Insufficient documentation

## 2011-02-10 DIAGNOSIS — E039 Hypothyroidism, unspecified: Secondary | ICD-10-CM | POA: Insufficient documentation

## 2011-02-10 DIAGNOSIS — Z7901 Long term (current) use of anticoagulants: Secondary | ICD-10-CM | POA: Insufficient documentation

## 2011-02-10 DIAGNOSIS — F329 Major depressive disorder, single episode, unspecified: Secondary | ICD-10-CM | POA: Insufficient documentation

## 2011-02-10 DIAGNOSIS — M1990 Unspecified osteoarthritis, unspecified site: Secondary | ICD-10-CM | POA: Insufficient documentation

## 2011-02-10 DIAGNOSIS — E119 Type 2 diabetes mellitus without complications: Secondary | ICD-10-CM | POA: Insufficient documentation

## 2011-02-10 DIAGNOSIS — Z8639 Personal history of other endocrine, nutritional and metabolic disease: Secondary | ICD-10-CM | POA: Insufficient documentation

## 2011-02-10 DIAGNOSIS — I10 Essential (primary) hypertension: Secondary | ICD-10-CM | POA: Insufficient documentation

## 2011-02-10 DIAGNOSIS — F3289 Other specified depressive episodes: Secondary | ICD-10-CM | POA: Insufficient documentation

## 2011-02-10 DIAGNOSIS — M25579 Pain in unspecified ankle and joints of unspecified foot: Secondary | ICD-10-CM | POA: Insufficient documentation

## 2011-02-10 DIAGNOSIS — I4891 Unspecified atrial fibrillation: Secondary | ICD-10-CM | POA: Insufficient documentation

## 2011-02-10 MED ORDER — HYDROCODONE-ACETAMINOPHEN 5-325 MG PO TABS: 2.0000 | ORAL_TABLET | ORAL | Status: AC | PRN

## 2011-02-10 NOTE — ED Notes (Signed)
Reports hearing a "pop" to left ankle while ambulating with walker, now having pain and swelling.

## 2011-02-10 NOTE — Progress Notes (Signed)
Orthopedic Tech Progress Note Patient Details:  [name] [birth year] [id number]  Other Ortho Devices Type of Ortho Device: CAM walker Ortho Device Location: (L) LE Ortho Device Interventions: Application   [name] 02/10/2011, 5:22 PM

## 2011-02-10 NOTE — ED Provider Notes (Signed)
History     CSN: [id number] Arrival date & time: 02/10/2011  2:40 PM   First MD Initiated Contact with Patient 02/10/11 1638      Chief Complaint  Patient presents with  . Ankle Pain    (Consider location/radiation/quality/duration/timing/severity/associated sxs/prior treatment) HPI Reports hearing a "pop" to left ankle while ambulating with walker last Monday, now having pain and swelling.   Past Medical History  Diagnosis Date  . CAD (coronary artery disease)     s/p remote MI (20+ yrs ago)  . Abdominal pain     recent admission, likely secondary to presbyesophagus and gastric dysmotility  . AF (atrial fibrillation)     on chronic warfarin  . CVD (cardiovascular disease)   . Diastolic dysfunction     with dilated cardiomyopathy, last EF 55%(09/2006)  . Respiratory failure, chronic     mixed etiology with bronchospastic component  . Urinary incontinence     recurrent uti's/resistance to cipro, bactrim  . Gout   . Hypothyroidism   . Hyperlipidemia   . Depression   . Osteoarthritis   . Morbidly obese     s/p gastric bypass  . Gastroesophageal reflux disease   . Ventral hernia     repair in April 2008, complicated by MRSA abdominal wall cellulitis  . DVT (deep venous thrombosis)   . Pulmonary embolism     1998, s/p [place] filter  . Diabetes mellitus   . Hypertension     Past Surgical History  Procedure Date  . Hernia repair   . Cholecystectomy   . Appendectomy   . Breast biopsy     Family History  Problem Relation Age of Onset  . Colon cancer Neg Hx     History  Substance Use Topics  . Smoking status: Former Smoker    Types: Cigarettes  . Smokeless tobacco: Former Neurosurgeon    Quit date: 05/22/1968  . Alcohol Use: No    OB History    Grav Para Term Preterm Abortions TAB SAB Ect Mult Living                  Review of Systems  All other systems reviewed and are negative.    Allergies  Nitrofurantoin  Home Medications   Current  Outpatient Rx  Name Route Sig Dispense Refill  . ACETAMINOPHEN 500 MG PO TABS Oral Take 1,000 mg by mouth 2 (two) times daily as needed. For pain.    . ALBUTEROL SULFATE HFA 108 (90 BASE) MCG/ACT IN AERS Inhalation Inhale 2 puffs into the lungs every 6 (six) hours as needed. For wheezing.    Marland Kitchen AMLODIPINE BESYLATE 5 MG PO TABS Oral Take 5 mg by mouth daily.      . ATORVASTATIN CALCIUM 40 MG PO TABS Oral Take 40 mg by mouth daily.      Marland Kitchen COLCRYS 0.6 MG PO TABS Oral Take 1 tablet by mouth Daily.    Marland Kitchen FLUOXETINE HCL 20 MG PO CAPS Oral Take 20 mg by mouth 2 (two) times daily.      Marland Kitchen FLUTICASONE-SALMETEROL 250-50 MCG/DOSE IN AEPB Inhalation Inhale 1 puff into the lungs every 12 (twelve) hours.      . FUROSEMIDE 40 MG PO TABS Oral Take 80 mg by mouth daily.      Marland Kitchen LEVOTHYROXINE SODIUM 125 MCG PO TABS Oral Take 125 mcg by mouth daily.      Marland Kitchen LOSARTAN POTASSIUM 50 MG PO TABS Oral Take 50 mg by mouth 2 (two) times  daily.      Marland Kitchen MONTELUKAST SODIUM 10 MG PO TABS Oral Take 10 mg by mouth at bedtime.      . OMEPRAZOLE 40 MG PO CPDR Oral Take 40 mg by mouth 2 (two) times daily.      Marland Kitchen ONDANSETRON 4 MG PO TBDP Oral Take 4 mg by mouth every 12 (twelve) hours as needed. For nausea.    Marland Kitchen POLYETHYLENE GLYCOL 3350 PO PACK Oral Take 17 g by mouth daily as needed. For constipation.     . SPIRONOLACTONE 25 MG PO TABS Oral Take 25 mg by mouth daily.      Marland Kitchen TIOTROPIUM BROMIDE MONOHYDRATE 18 MCG IN CAPS Inhalation Place 18 mcg into inhaler and inhale daily.      . TOLTERODINE TARTRATE 2 MG PO CP24 Oral Take 2 mg by mouth daily.      Marland Kitchen TRIMETHOPRIM 100 MG PO TABS Oral Take 1 tablet by mouth Daily.    . WARFARIN SODIUM 5 MG PO TABS Oral Take 5 mg by mouth daily.      Marland Kitchen ZOLPIDEM TARTRATE 10 MG PO TABS Oral Take 10 mg by mouth at bedtime as needed. For sleep.       BP 94/70  Pulse 64  Temp(Src) 98.2 F (36.8 C) (Oral)  Resp 20  SpO2 94%  LMP 05/22/1968  Physical Exam  Nursing note and vitals  reviewed. Constitutional: She is oriented to person, place, and time. She appears well-developed and well-nourished. No distress.  HENT:  Head: Normocephalic and atraumatic.  Eyes: Pupils are equal, round, and reactive to light.  Neck: Normal range of motion.  Cardiovascular: Normal rate and intact distal pulses.   Pulmonary/Chest: No respiratory distress.  Abdominal: Normal appearance. She exhibits no distension.  Musculoskeletal:       Feet:  Neurological: She is alert and oriented to person, place, and time. No cranial nerve deficit.  Skin: Skin is warm and dry. No rash noted.  Psychiatric: She has a normal mood and affect. Her behavior is normal.    ED Course  Procedures (including critical care time)  Labs Reviewed - No data to display Dg Ankle Complete Left  02/10/2011  *RADIOLOGY REPORT*  Clinical Data: Pain  LEFT ANKLE COMPLETE - 3+ VIEW  Comparison: None  Findings:  There is mild diffuse soft tissue swelling.  There is a minimally displaced fracture deformity involving the tip of the lateral malleolus.  No additional fractures or subluxations identified.  Small posterior calcaneal heel spur noted.  IMPRESSION:  1.  Tip of the lateral malleolus fracture. 2.  Soft tissue swelling.  Original Report Authenticated By: Rosealee Albee, M.D.     1. Fracture of distal fibula       MDM          Nelia Shi, MD 02/11/11 [phone]

## 2011-02-15 ENCOUNTER — Other Ambulatory Visit: Payer: Self-pay | Admitting: Internal Medicine

## 2011-02-21 ENCOUNTER — Other Ambulatory Visit: Payer: Self-pay | Admitting: *Deleted

## 2011-02-22 MED ORDER — LEVOTHYROXINE SODIUM 125 MCG PO TABS: 125.0000 ug | ORAL_TABLET | Freq: Every day | ORAL | Status: DC

## 2011-02-27 ENCOUNTER — Encounter (INDEPENDENT_AMBULATORY_CARE_PROVIDER_SITE_OTHER): Payer: Medicare Other | Admitting: Ophthalmology

## 2011-02-27 DIAGNOSIS — H3530 Unspecified macular degeneration: Secondary | ICD-10-CM

## 2011-02-27 DIAGNOSIS — H43819 Vitreous degeneration, unspecified eye: Secondary | ICD-10-CM

## 2011-02-27 DIAGNOSIS — H353290 Exudative age-related macular degeneration, unspecified eye, stage unspecified: Secondary | ICD-10-CM

## 2011-02-28 ENCOUNTER — Inpatient Hospital Stay (HOSPITAL_COMMUNITY)
Admission: AD | Admit: 2011-02-28 | Discharge: 2011-03-04 | DRG: 690 | Disposition: A | Discharge status: Home / Self Care | Payer: Medicare Other | Source: Ambulatory Visit | Attending: Internal Medicine | Admitting: Internal Medicine

## 2011-02-28 ENCOUNTER — Telehealth: Payer: Self-pay | Admitting: *Deleted

## 2011-02-28 ENCOUNTER — Encounter (HOSPITAL_COMMUNITY): Payer: Self-pay | Admitting: General Practice

## 2011-02-28 ENCOUNTER — Ambulatory Visit (INDEPENDENT_AMBULATORY_CARE_PROVIDER_SITE_OTHER): Payer: Medicare Other | Admitting: Internal Medicine

## 2011-02-28 ENCOUNTER — Encounter: Payer: Self-pay | Admitting: Internal Medicine

## 2011-02-28 VITALS — BP 110/66 | HR 61 | Temp 97.5°F | Wt 304.2 lb

## 2011-02-28 DIAGNOSIS — I2510 Atherosclerotic heart disease of native coronary artery without angina pectoris: Secondary | ICD-10-CM | POA: Diagnosis present

## 2011-02-28 DIAGNOSIS — F29 Unspecified psychosis not due to a substance or known physiological condition: Secondary | ICD-10-CM

## 2011-02-28 DIAGNOSIS — M109 Gout, unspecified: Secondary | ICD-10-CM | POA: Diagnosis present

## 2011-02-28 DIAGNOSIS — I252 Old myocardial infarction: Secondary | ICD-10-CM

## 2011-02-28 DIAGNOSIS — F3289 Other specified depressive episodes: Secondary | ICD-10-CM | POA: Diagnosis present

## 2011-02-28 DIAGNOSIS — R300 Dysuria: Secondary | ICD-10-CM

## 2011-02-28 DIAGNOSIS — Z9884 Bariatric surgery status: Secondary | ICD-10-CM

## 2011-02-28 DIAGNOSIS — A498 Other bacterial infections of unspecified site: Secondary | ICD-10-CM | POA: Diagnosis present

## 2011-02-28 DIAGNOSIS — Z86711 Personal history of pulmonary embolism: Secondary | ICD-10-CM

## 2011-02-28 DIAGNOSIS — F329 Major depressive disorder, single episode, unspecified: Secondary | ICD-10-CM | POA: Diagnosis present

## 2011-02-28 DIAGNOSIS — R410 Disorientation, unspecified: Secondary | ICD-10-CM

## 2011-02-28 DIAGNOSIS — Z8673 Personal history of transient ischemic attack (TIA), and cerebral infarction without residual deficits: Secondary | ICD-10-CM

## 2011-02-28 DIAGNOSIS — I4891 Unspecified atrial fibrillation: Secondary | ICD-10-CM | POA: Diagnosis present

## 2011-02-28 DIAGNOSIS — Z86718 Personal history of other venous thrombosis and embolism: Secondary | ICD-10-CM

## 2011-02-28 DIAGNOSIS — Z87440 Personal history of urinary (tract) infections: Secondary | ICD-10-CM

## 2011-02-28 DIAGNOSIS — Z7901 Long term (current) use of anticoagulants: Secondary | ICD-10-CM

## 2011-02-28 DIAGNOSIS — I428 Other cardiomyopathies: Secondary | ICD-10-CM | POA: Diagnosis present

## 2011-02-28 DIAGNOSIS — E119 Type 2 diabetes mellitus without complications: Secondary | ICD-10-CM

## 2011-02-28 DIAGNOSIS — Z993 Dependence on wheelchair: Secondary | ICD-10-CM

## 2011-02-28 DIAGNOSIS — E785 Hyperlipidemia, unspecified: Secondary | ICD-10-CM | POA: Diagnosis present

## 2011-02-28 DIAGNOSIS — R4182 Altered mental status, unspecified: Secondary | ICD-10-CM | POA: Diagnosis present

## 2011-02-28 DIAGNOSIS — Z9981 Dependence on supplemental oxygen: Secondary | ICD-10-CM

## 2011-02-28 DIAGNOSIS — Z79899 Other long term (current) drug therapy: Secondary | ICD-10-CM

## 2011-02-28 DIAGNOSIS — Z9119 Patient's noncompliance with other medical treatment and regimen: Secondary | ICD-10-CM

## 2011-02-28 DIAGNOSIS — I509 Heart failure, unspecified: Secondary | ICD-10-CM | POA: Diagnosis present

## 2011-02-28 DIAGNOSIS — N390 Urinary tract infection, site not specified: Secondary | ICD-10-CM

## 2011-02-28 DIAGNOSIS — Z87891 Personal history of nicotine dependence: Secondary | ICD-10-CM

## 2011-02-28 DIAGNOSIS — J9610 Chronic respiratory failure, unspecified whether with hypoxia or hypercapnia: Secondary | ICD-10-CM | POA: Diagnosis present

## 2011-02-28 DIAGNOSIS — Z6841 Body Mass Index (BMI) 40.0 and over, adult: Secondary | ICD-10-CM

## 2011-02-28 DIAGNOSIS — E039 Hypothyroidism, unspecified: Secondary | ICD-10-CM | POA: Diagnosis present

## 2011-02-28 DIAGNOSIS — R32 Unspecified urinary incontinence: Secondary | ICD-10-CM | POA: Diagnosis present

## 2011-02-28 DIAGNOSIS — I10 Essential (primary) hypertension: Secondary | ICD-10-CM | POA: Diagnosis present

## 2011-02-28 DIAGNOSIS — Z91199 Patient's noncompliance with other medical treatment and regimen due to unspecified reason: Secondary | ICD-10-CM

## 2011-02-28 DIAGNOSIS — M1990 Unspecified osteoarthritis, unspecified site: Secondary | ICD-10-CM | POA: Diagnosis present

## 2011-02-28 HISTORY — DX: Anemia, unspecified: D64.9

## 2011-02-28 HISTORY — DX: Cerebral infarction, unspecified: I63.9

## 2011-02-28 HISTORY — DX: Acute myocardial infarction, unspecified: I21.9

## 2011-02-28 LAB — COMPREHENSIVE METABOLIC PANEL
ALT: 51 U/L — ABNORMAL HIGH (ref 0–35)
CO2: 28 mEq/L (ref 19–32)
Calcium: 10.6 mg/dL — ABNORMAL HIGH (ref 8.4–10.5)
Chloride: 95 mEq/L — ABNORMAL LOW (ref 96–112)
Potassium: 5.1 mEq/L (ref 3.5–5.3)
Sodium: 133 mEq/L — ABNORMAL LOW (ref 135–145)
Total Protein: 7.5 g/dL (ref 6.0–8.3)

## 2011-02-28 LAB — MRSA PCR SCREENING: MRSA by PCR: NEGATIVE

## 2011-02-28 LAB — GLUCOSE, CAPILLARY: Glucose-Capillary: 244 mg/dL — ABNORMAL HIGH (ref 70–99)

## 2011-02-28 LAB — CBC
MCHC: 32.3 g/dL (ref 30.0–36.0)
Platelets: 287 10*3/uL (ref 150–400)
RDW: 14.8 % (ref 11.5–15.5)
WBC: 8.3 10*3/uL (ref 4.0–10.5)

## 2011-02-28 LAB — PROTIME-INR: INR: 2.7 — ABNORMAL HIGH (ref 0.00–1.49)

## 2011-02-28 MED ORDER — FLUTICASONE-SALMETEROL 250-50 MCG/DOSE IN AEPB
1.0000 | INHALATION_SPRAY | Freq: Two times a day (BID) | RESPIRATORY_TRACT | Status: DC
  Administered 2011-02-28 – 2011-03-04 (×8): 1 via RESPIRATORY_TRACT
  Filled 2011-02-28: qty 14

## 2011-02-28 MED ORDER — MONTELUKAST SODIUM 10 MG PO TABS
10.0000 mg | ORAL_TABLET | Freq: Every day | ORAL | Status: DC
  Administered 2011-02-28 – 2011-03-03 (×4): 10 mg via ORAL
  Filled 2011-02-28 (×5): qty 1

## 2011-02-28 MED ORDER — SODIUM CHLORIDE 0.9 % IV SOLN
INTRAVENOUS | Status: AC
  Administered 2011-02-28 – 2011-03-01 (×2): via INTRAVENOUS

## 2011-02-28 MED ORDER — PANTOPRAZOLE SODIUM 40 MG PO TBEC
40.0000 mg | DELAYED_RELEASE_TABLET | Freq: Every day | ORAL | Status: DC
  Administered 2011-02-28 – 2011-03-04 (×5): 40 mg via ORAL
  Filled 2011-02-28 (×5): qty 1

## 2011-02-28 MED ORDER — WARFARIN SODIUM 5 MG PO TABS
5.0000 mg | ORAL_TABLET | Freq: Every day | ORAL | Status: DC
  Administered 2011-02-28 – 2011-03-01 (×2): 5 mg via ORAL
  Filled 2011-02-28 (×2): qty 1

## 2011-02-28 MED ORDER — ALBUTEROL SULFATE HFA 108 (90 BASE) MCG/ACT IN AERS
2.0000 | INHALATION_SPRAY | Freq: Four times a day (QID) | RESPIRATORY_TRACT | Status: DC | PRN
  Filled 2011-02-28: qty 6.7

## 2011-02-28 MED ORDER — TIOTROPIUM BROMIDE MONOHYDRATE 18 MCG IN CAPS
18.0000 ug | ORAL_CAPSULE | Freq: Every day | RESPIRATORY_TRACT | Status: DC
  Administered 2011-03-01 – 2011-03-04 (×4): 18 ug via RESPIRATORY_TRACT
  Filled 2011-02-28: qty 5

## 2011-02-28 MED ORDER — COLCHICINE 0.6 MG PO TABS
0.6000 mg | ORAL_TABLET | Freq: Every day | ORAL | Status: DC
  Administered 2011-02-28 – 2011-03-04 (×5): 0.6 mg via ORAL
  Filled 2011-02-28 (×5): qty 1

## 2011-02-28 MED ORDER — ACETAMINOPHEN 325 MG PO TABS
650.0000 mg | ORAL_TABLET | ORAL | Status: DC | PRN
  Administered 2011-03-01 – 2011-03-04 (×3): 650 mg via ORAL
  Filled 2011-02-28 (×3): qty 2

## 2011-02-28 MED ORDER — ROSUVASTATIN CALCIUM 20 MG PO TABS
20.0000 mg | ORAL_TABLET | Freq: Every day | ORAL | Status: DC
  Administered 2011-02-28 – 2011-03-04 (×5): 20 mg via ORAL
  Filled 2011-02-28 (×5): qty 1

## 2011-02-28 MED ORDER — FLUOXETINE HCL 20 MG PO CAPS
20.0000 mg | ORAL_CAPSULE | Freq: Two times a day (BID) | ORAL | Status: DC
  Administered 2011-02-28 – 2011-03-04 (×8): 20 mg via ORAL
  Filled 2011-02-28 (×10): qty 1

## 2011-02-28 MED ORDER — TOLTERODINE TARTRATE ER 2 MG PO CP24
2.0000 mg | ORAL_CAPSULE | Freq: Every day | ORAL | Status: DC
  Administered 2011-02-28 – 2011-03-04 (×5): 2 mg via ORAL
  Filled 2011-02-28 (×5): qty 1

## 2011-02-28 MED ORDER — AMLODIPINE BESYLATE 5 MG PO TABS
5.0000 mg | ORAL_TABLET | Freq: Every day | ORAL | Status: DC
  Administered 2011-02-28 – 2011-03-04 (×5): 5 mg via ORAL
  Filled 2011-02-28 (×5): qty 1

## 2011-02-28 MED ORDER — ENOXAPARIN SODIUM 40 MG/0.4ML ~~LOC~~ SOLN: 40.0000 mg | SUBCUTANEOUS | Status: DC

## 2011-02-28 MED ORDER — ONDANSETRON HCL 4 MG/2ML IJ SOLN: 4.0000 mg | Freq: Four times a day (QID) | INTRAMUSCULAR | Status: DC | PRN

## 2011-02-28 MED ORDER — SPIRONOLACTONE 25 MG PO TABS
25.0000 mg | ORAL_TABLET | Freq: Every day | ORAL | Status: DC
  Administered 2011-02-28 – 2011-03-04 (×5): 25 mg via ORAL
  Filled 2011-02-28 (×5): qty 1

## 2011-02-28 MED ORDER — POLYETHYLENE GLYCOL 3350 17 G PO PACK
17.0000 g | PACK | Freq: Every day | ORAL | Status: DC | PRN
  Administered 2011-03-01 – 2011-03-03 (×2): 17 g via ORAL
  Filled 2011-02-28 (×3): qty 1

## 2011-02-28 MED ORDER — LOSARTAN POTASSIUM 50 MG PO TABS
50.0000 mg | ORAL_TABLET | Freq: Two times a day (BID) | ORAL | Status: DC
  Administered 2011-02-28 – 2011-03-04 (×8): 50 mg via ORAL
  Filled 2011-02-28 (×9): qty 1

## 2011-02-28 MED ORDER — BIOTENE DRY MOUTH MT LIQD
15.0000 mL | Freq: Two times a day (BID) | OROMUCOSAL | Status: DC
  Administered 2011-02-28 – 2011-03-04 (×5): 15 mL via OROMUCOSAL

## 2011-02-28 MED ORDER — LEVOTHYROXINE SODIUM 125 MCG PO TABS
125.0000 ug | ORAL_TABLET | Freq: Every day | ORAL | Status: DC
  Administered 2011-03-01 – 2011-03-04 (×4): 125 ug via ORAL
  Filled 2011-02-28 (×5): qty 1

## 2011-02-28 MED ORDER — FUROSEMIDE 80 MG PO TABS
80.0000 mg | ORAL_TABLET | Freq: Every day | ORAL | Status: DC
  Administered 2011-02-28 – 2011-03-04 (×5): 80 mg via ORAL
  Filled 2011-02-28 (×5): qty 1

## 2011-02-28 NOTE — Assessment & Plan Note (Signed)
Patient experienced acute episode of confusion and altered mental status this morning but is now resolved. Per family friend patient is returned to her baseline. She does have symptoms suggestive of acute urinary tract infection but did complain her altered mental status her review of previous urine cultures have shown numerous Escherichia coli specimens resistant to all oral therapy.  Given her history and symptoms her acute altered mental status is likely the result of acute UTI or recent Vicodin prescribed in the emergency room.  However,  given her history and numerous risk factors she will benefit from hospital admission for observation and continued evaluation.  Will obtain CBC, CMET, TSH, blood cultures, urinalysis with urine culture and 2 view chest x-ray.  Additionally she may need social work involvement for SNF, PACE program, or increased [facility] services as she will be home alone after 1/16 (family friend returning to [place], son works all day, and pt only currently has [facility] aid for 2 hrs 3x/week.

## 2011-02-28 NOTE — Progress Notes (Signed)
ANTICOAGULATION CONSULT  -FOLLOW UP NOTE For COUMADIN  Admit INR = 2.7 Therapeutic;  Goal INR =2.0  For h/o PE, DVT, and AFib  Plan: Continue her home dosage 5mg  coumadin daily.  DVT  Prophylaxis lovenox not needed as INR is therapeutic.  Monitor daily INR.    Arman Filter 02/27/10 @ 22:15

## 2011-02-28 NOTE — Progress Notes (Signed)
ANTICOAGULATION CONSULT NOTE - Initial Consult  Pharmacy Consult for Coumadin Indication:  H/o PE/DVT/A.Fib  Allergies  Allergen Reactions  . Nitrofurantoin (Macrodantin) Itching    Patient Measurements: Height: 5\' 4"  (162.6 cm) Weight: 299 lb (135.626 kg) IBW/kg (Calculated) : 54.7   Vital Signs: Temp: 98.2 F (36.8 C) (01/03 1757) Temp src: Oral (01/03 1757) BP: 147/78 mmHg (01/03 1757) Pulse Rate: 58  (01/03 1757)  Labs:  Basename 02/28/11 1632  HGB 14.7  HCT 45.5  PLT 287  APTT --  LABPROT --  INR --  HEPARINUNFRC --  CREATININE 1.43*  CKTOTAL --  CKMB --  TROPONINI --   Estimated Creatinine Clearance: 46 ml/min (by C-G formula based on Cr of 1.43).  Medical History: Past Medical History  Diagnosis Date  . CAD (coronary artery disease)     s/p remote MI (20+ yrs ago)  . Abdominal pain     recent admission, likely secondary to presbyesophagus and gastric dysmotility  . CVD (cardiovascular disease)   . Diastolic dysfunction     with dilated cardiomyopathy, last EF 55%(09/2006)  . Respiratory failure, chronic     mixed etiology with bronchospastic component  . Urinary incontinence     recurrent uti's/resistance to cipro, bactrim  . Gout   . Hypothyroidism   . Hyperlipidemia   . Depression   . Morbidly obese     s/p gastric bypass  . Gastroesophageal reflux disease   . Ventral hernia     repair in April 2008, complicated by MRSA abdominal wall cellulitis  . DVT (deep venous thrombosis)   . Pulmonary embolism     1998, s/p Greenfield filter  . Diabetes mellitus   . Hypertension   . Pyelonephritis   . History of recurrent UTIs   . AF (atrial fibrillation)     on chronic warfarin  . Osteoarthritis   . Myocardial infarction   . Stroke     denies residual  . Blood transfusion   . Anemia     Medications:  Prescriptions prior to admission  Medication Sig Dispense Refill  . acetaminophen (TYLENOL) 500 MG tablet Take 1,000 mg by mouth 2 (two)  times daily as needed. For pain.      Marland Kitchen albuterol (PROVENTIL HFA) 108 (90 BASE) MCG/ACT inhaler Inhale 2 puffs into the lungs every 6 (six) hours as needed. For wheezing.      Marland Kitchen amLODipine (NORVASC) 5 MG tablet Take 5 mg by mouth daily.        Marland Kitchen atorvastatin (LIPITOR) 40 MG tablet Take 40 mg by mouth daily.        Marland Kitchen COLCRYS 0.6 MG tablet Take 1 tablet by mouth Daily.      Marland Kitchen FLUoxetine (PROZAC) 20 MG capsule Take 20 mg by mouth 2 (two) times daily.        . Fluticasone-Salmeterol (ADVAIR) 250-50 MCG/DOSE AEPB Inhale 1 puff into the lungs every 12 (twelve) hours.        . furosemide (LASIX) 40 MG tablet Take 80 mg by mouth daily.        Marland Kitchen levothyroxine (SYNTHROID, LEVOTHROID) 125 MCG tablet TAKE ONE TABLET BY MOUTH EVERY DAY  31 tablet  11  . levothyroxine (SYNTHROID, LEVOTHROID) 125 MCG tablet Take 1 tablet (125 mcg total) by mouth daily.  30 tablet  6  . losartan (COZAAR) 50 MG tablet Take 50 mg by mouth 2 (two) times daily.        . montelukast (SINGULAIR) 10 MG tablet Take  10 mg by mouth at bedtime.        Marland Kitchen omeprazole (PRILOSEC) 40 MG capsule Take 40 mg by mouth 2 (two) times daily.        . ondansetron (ZOFRAN-ODT) 4 MG disintegrating tablet Take 4 mg by mouth every 12 (twelve) hours as needed. For nausea.      . polyethylene glycol (MIRALAX / GLYCOLAX) packet Take 17 g by mouth daily as needed. For constipation.       Marland Kitchen spironolactone (ALDACTONE) 25 MG tablet Take 25 mg by mouth daily.        Marland Kitchen tiotropium (SPIRIVA) 18 MCG inhalation capsule Place 18 mcg into inhaler and inhale daily.        Marland Kitchen tolterodine (DETROL LA) 2 MG 24 hr capsule Take 2 mg by mouth daily.        Marland Kitchen trimethoprim (TRIMPEX) 100 MG tablet Take 1 tablet by mouth Daily.      Marland Kitchen warfarin (COUMADIN) 5 MG tablet Take 5 mg by mouth daily.         Scheduled:    . amLODipine  5 mg Oral Daily  . antiseptic oral rinse  15 mL Mouth Rinse BID  . colchicine  0.6 mg Oral Daily  . FLUoxetine  20 mg Oral BID  . Fluticasone-Salmeterol   1 puff Inhalation Q12H  . furosemide  80 mg Oral Daily  . levothyroxine  125 mcg Oral QAC breakfast  . losartan  50 mg Oral BID  . montelukast  10 mg Oral QHS  . pantoprazole  40 mg Oral Q1200  . rosuvastatin  20 mg Oral Daily  . spironolactone  25 mg Oral Daily  . tiotropium  18 mcg Inhalation Daily  . tolterodine  2 mg Oral Daily  . warfarin  5 mg Oral Daily  . DISCONTD: enoxaparin (LOVENOX) injection  40 mg Subcutaneous Q24H    Assessment: 78 y.o. Female on chronic coumadin prior to admission for history of PE, DVT, and Atrial fibrillatin.  Admitted today with new onset of confusion and fatigue for 4days.  No symptoms or complain consistent with CVA per MD's assessment.    Goal of Therapy:  INR 2-3   Plan:  Awaiting PT/INR result.  Coumadin 5 mg po already given today per MD's order. Will follow up tonight's INR, daily INR and adjust Coumadin daily for goal INR =2-3.  Arman Filter 02/28/2011,9:07 PM

## 2011-02-28 NOTE — Telephone Encounter (Signed)
Call from patient's son stating that patient seems to be a little more confused than usual. Didn't get out of bed and when she does she sits around and " gruts" all day.  Son concerned, spoke with triage, pt to be seen today in the clinic.Kingsley Spittle Cassady1/3/20131:52 PM

## 2011-02-28 NOTE — Progress Notes (Signed)
Poor venous acces - attempted x1; IV team called. Report called to [facility] Greenley RN [phone].

## 2011-02-28 NOTE — Progress Notes (Signed)
  Subjective:    Patient ID: [name], female    DOB: 1932/10/27, 78 y.o.   MRN: [id number]  HPI  Pt states she is here because her son wanted her to be seen for increased sleepiness.  She notes this has been occuring for the past few months.  A family friend is present and reports the pt occasionally becomes confused.  She is having some difficulty remembering to take medications.  This happens intermittently.  Pt states she awoke this morning and did not know where she was; this lasted for "awhile."  She notes this type of confusion has happened previously; at that time some of her medications were changed and her symptoms improved.  She has not experienced similar confusion since.   Pt denies fall, LOC, fevers, chills, dizziness, you'll changes, or new weakness/numbness/tingling  Pt admits to dysuria for 2 days as well as some hesitancy and nocturia. She denies hematuria. She denies abdominal pain, nausea, vomiting, or diarrhea.  She admits to a nonproductive cough over the past few days as well. She denies any other associated symptoms including chest pain, dyspnea on exertion, sinus congestion, sore throat, headache, or this appeared   Review of Systems    12 point review of systems completed. Pertinent items noted above in history of present illness all systems reviewed negative. Objective:   Physical Exam  VItal signs reviewed and stable. GEN: No apparent distress.  Alert and oriented x 3.  Pleasant, conversant, and cooperative to exam. HEENT: head is autraumatic and normocephalic.  Neck is supple without palpable masses or lymphadenopathy.  No JVD or carotid bruits.  Vision intact.  EOMI.  PERRLA.  Sclerae anicteric.  Conjunctivae without pallor or injection. Mucous membranes are moist.  Oropharynx is without erythema, exudates, or other abnormal lesions.  Dentition is poor with numerous teeth missing. RESP:  Lungs are clear to ascultation bilaterally with good air movement.  No  wheezes, ronchi, or rubs. CARDIOVASCULAR: regular rate, normal rhythm.  Distant but clear S1, S2, no murmurs, gallops, or rubs. ABDOMEN: soft, non-tender, non-distended.  Bowels sounds present in all quadrants and normoactive.  No palpable masses. EXT: warm and dry.    No clubbing or cyanosis. No edema in bilateral lower extremities. SKIN: warm and dry with normal turgor.  No rashes or abnormal lesions observed. NEURO: CN II-XII grossly intact.  Muscle strength +5/5 in bilateral upper and 4/5 lower extremities.  Sensation is grossly intact.        Assessment & Plan:

## 2011-02-28 NOTE — H&P (Signed)
Hospital Admission Note Date: 02/28/2011  Patient name: [name] Medical record number: [id number] Date of birth: 12-05-1932 Age: 78 y.o. Gender: female PCP: ROGERS,STEWART, MD  Medical Service: Internal Medicine Teaching Service  Chief Complaint: confusion, malaise  History of Present Illness:Patient is a 78 yo female with extensive past medical history including stroke, DVT, PE, hypothyroidism and multiple UTIs who is presenting with new onset of confusion and increased fatigue for 4 days.  Patient's son and caretaker report patient has been forgetting to take her medications this week and even forgetting whether she has eaten or not sometimes.  Patient says she does not recall this though she does endorse that she has trouble remembering things like where he son is going, though that is not new.  She and her son both report that she has been sleeping a lot more and not wanting to get out of bed this week and patient says it is because she is more tired than usual.  Both report this is unusual behavior.  Her mobility is also compromised and she requires assisstance to get around the house, however, this correlates to her foot fracture 2 weeks ago.  She has been using a walker and pain medication (vicodin) for the past week and a half.  Her history regarding her pain level is somewhat inconsistent as she says she needs more pain medication, but that it doesn't hurt and that she has plenty of meds at home.   Son also reports new shortness of breath with normal activities like walking in her home. St baseline, patient can change her clothes, eat and and go to the bathroom independently although she needs help with showering and cooking. She has has a home health aide 4X a week for 2 hours a day who helps with these activities. Recent medicine changes include vicodin for foot fracture and change in thyroid medication dose 6-7 weeks ago. She is on 2L of oxygen at home and uses a wheelchair at baseline.  She denies fever, chills and diarrhea though she reports bruning with urination and rectal pain. She has a history of self-reducing hemorrhoids.  Meds:  Medications Prior to Admission  Medication Sig Dispense Refill  . acetaminophen (TYLENOL) 500 MG tablet Take 1,000 mg by mouth 2 (two) times daily as needed. For pain.      Marland Kitchen albuterol (PROVENTIL HFA) 108 (90 BASE) MCG/ACT inhaler Inhale 2 puffs into the lungs every 6 (six) hours as needed. For wheezing.      Marland Kitchen amLODipine (NORVASC) 5 MG tablet Take 5 mg by mouth daily.        Marland Kitchen atorvastatin (LIPITOR) 40 MG tablet Take 40 mg by mouth daily.        Marland Kitchen COLCRYS 0.6 MG tablet Take 1 tablet by mouth Daily.      Marland Kitchen FLUoxetine (PROZAC) 20 MG capsule Take 20 mg by mouth 2 (two) times daily.        . Fluticasone-Salmeterol (ADVAIR) 250-50 MCG/DOSE AEPB Inhale 1 puff into the lungs every 12 (twelve) hours.        . furosemide (LASIX) 40 MG tablet Take 80 mg by mouth daily.        Marland Kitchen levothyroxine (SYNTHROID, LEVOTHROID) 125 MCG tablet TAKE ONE TABLET BY MOUTH EVERY DAY  31 tablet  11  . levothyroxine (SYNTHROID, LEVOTHROID) 125 MCG tablet Take 1 tablet (125 mcg total) by mouth daily.  30 tablet  6  . losartan (COZAAR) 50 MG tablet Take 50 mg by mouth  2 (two) times daily.        . montelukast (SINGULAIR) 10 MG tablet Take 10 mg by mouth at bedtime.        Marland Kitchen omeprazole (PRILOSEC) 40 MG capsule Take 40 mg by mouth 2 (two) times daily.        . ondansetron (ZOFRAN-ODT) 4 MG disintegrating tablet Take 4 mg by mouth every 12 (twelve) hours as needed. For nausea.      . polyethylene glycol (MIRALAX / GLYCOLAX) packet Take 17 g by mouth daily as needed. For constipation.       Marland Kitchen spironolactone (ALDACTONE) 25 MG tablet Take 25 mg by mouth daily.        Marland Kitchen tiotropium (SPIRIVA) 18 MCG inhalation capsule Place 18 mcg into inhaler and inhale daily.        Marland Kitchen tolterodine (DETROL LA) 2 MG 24 hr capsule Take 2 mg by mouth daily.        Marland Kitchen trimethoprim (TRIMPEX) 100 MG tablet  Take 1 tablet by mouth Daily.      Marland Kitchen warfarin (COUMADIN) 5 MG tablet Take 5 mg by mouth daily.          Allergies: Nitrofurantoin Past Medical History  Diagnosis Date  . CAD (coronary artery disease)     s/p remote MI (20+ yrs ago)  . Abdominal pain     recent admission, likely secondary to presbyesophagus and gastric dysmotility  . CVD (cardiovascular disease)   . Diastolic dysfunction     with dilated cardiomyopathy, last EF 55%(09/2006)  . Respiratory failure, chronic     mixed etiology with bronchospastic component  . Urinary incontinence     recurrent uti's/resistance to cipro, bactrim  . Gout   . Hypothyroidism   . Hyperlipidemia   . Depression   . Morbidly obese     s/p gastric bypass  . Gastroesophageal reflux disease   . Ventral hernia     repair in April 2008, complicated by MRSA abdominal wall cellulitis  . DVT (deep venous thrombosis)   . Pulmonary embolism     1998, s/p Greenfield filter  . Diabetes mellitus   . Hypertension   . Pyelonephritis   . History of recurrent UTIs   . AF (atrial fibrillation)     on chronic warfarin  . Osteoarthritis   . Myocardial infarction   . Stroke     denies residual  . Blood transfusion   . Anemia    Past Surgical History  Procedure Date  . Cholecystectomy   . Appendectomy   . Breast biopsy   . Gastric bypass 1970's  . Hernia repair 05/2006    ventral hernia  . Greenfield filter placement 1998   Family History  Problem Relation Age of Onset  . Colon cancer Neg Hx    History   Social History  . Marital Status: Widowed    Spouse Name: N/A    Number of Children: N/A  . Years of Education: N/A   Occupational History  . Not on file.   Social History Main Topics  . Smoking status: Former Smoker -- 1.0 packs/day for 1.5 years    Types: Cigarettes  . Smokeless tobacco: Never Used  . Alcohol Use: No  . Drug Use: No  . Sexually Active: No   Other Topics Concern  . Not on file   Social History Narrative     She lives w/her son. Her son offers help and she has an aid who occasionally cooks for her  and takes care of some other tasks around the pt's house.Has medicare. Has son in [place] who is POAAmbivalent about DNR issues and does not want a STOP sign posted in home   Physical Exam: Blood pressure 147/78, pulse 58, temperature 98.2 F (36.8 C), temperature source Oral, resp. rate 20, height 5\' 4"  (1.626 m), weight 299 lb (135.626 kg), last menstrual period 05/22/1968, SpO2 96.00%. General: elderly woman resting in bed HEENT: PERRL, patient unable to follow finger laterally in both directions due to dizziness, no scleral icterus Cardiac: irregular pulse palpated, very difficult to auscultate heart sounds Pulm: clear to auscultation bilaterally, moving normal volumes of air Abd: soft, nontender, nondistended, BS present Ext: warm and well perfused, no pedal edema Neuro: alert and oriented X3, cranial nerves II-XII grossly intact  Lab results: Basic Metabolic Panel:  Basename 02/28/11 1632  NA 133*  K 5.1  CL 95*  CO2 28  GLUCOSE 231*  BUN 30*  CREATININE 1.43*  CALCIUM 10.6*  MG --  PHOS --   Liver Function Tests:  [facility] 02/28/11 1632  AST 31  ALT 51*  ALKPHOS 116  BILITOT 0.5  PROT 7.5  ALBUMIN 3.9   CBC:  Basename 02/28/11 1632  WBC 8.3  NEUTROABS --  HGB 14.7  HCT 45.5  MCV 94.4  PLT 287   CBG:  Basename 02/28/11 1443  GLUCAP 244*   Hemoglobin A1C:  Basename 02/28/11 1453  HGBA1C 8.6   Thyroid Function Tests:  Other results: EKG:   Assessment & Plan by Problem: 1) Altered mental status: Patient has confusion and forgetfulness recently. Unclear etiology- dementia vs. Infection ( history of recurrent UTI ) vs. recent Vicodin for ankle fracture. No symptoms or complain consistent with CVA.  Plan: Admit to telemetry bed. - UA and urine culture - Hold opiates. - Mini-Mental Status exam. - TSH - CBC, CMP, Blood Culture x2.  2) Recurrent UTI and  urinary incontinence: UA and urine cultures #1. - Patient had urology referral in August 2012- for the same along with incontinence - She has been tried on anti-cholinergics in past which didn't work. - Urology recommended neuromodulator therapy if she doesn't get better.  Plan: IV ceftriaxone if UA and urine culture shows infection. - Continue Tolterodine at home dose.  3) DM 2: HbA1c 8.6 today.  Plan- sensitive sliding scale.  4) History of CHF: Mild reduction and right ventricular ejection with EF of 55% in 2008. History of nonischemic cardiomyopathy.  Plan: Compensated CHF - Continue home meds-Lasix, losartan, spironolactone.   5) A. Fib And history of PE: Continue Coumadin   6) Hypertension:Continue Norvasc and above medications as in CHF.  7) Hypothyroidism: Last TSH 4.0 Continue levothyroxine- 125 mcg daily. - Check TSH.  8) DVT prophylaxis: Coumadin.   SignedMargorie John 02/28/2011, 6:39 PM

## 2011-03-01 DIAGNOSIS — I4891 Unspecified atrial fibrillation: Secondary | ICD-10-CM

## 2011-03-01 LAB — URINALYSIS, ROUTINE W REFLEX MICROSCOPIC
Bilirubin Urine: NEGATIVE
Glucose, UA: NEGATIVE mg/dL
Hgb urine dipstick: NEGATIVE
Ketones, ur: NEGATIVE mg/dL
Nitrite: POSITIVE — AB
pH: 5 (ref 5.0–8.0)

## 2011-03-01 LAB — BASIC METABOLIC PANEL
CO2: 26 mEq/L (ref 19–32)
Chloride: 97 mEq/L (ref 96–112)
Creatinine, Ser: 1.37 mg/dL — ABNORMAL HIGH (ref 0.50–1.10)
GFR calc Af Amer: 42 mL/min — ABNORMAL LOW (ref >90)
Sodium: 134 mEq/L — ABNORMAL LOW (ref 135–145)

## 2011-03-01 LAB — PROTIME-INR: INR: 2.83 — ABNORMAL HIGH (ref 0.00–1.49)

## 2011-03-01 LAB — CBC
HCT: 39.4 % (ref 36.0–46.0)
MCV: 93.6 fL (ref 78.0–100.0)
Platelets: 216 10*3/uL (ref 150–400)
RBC: 4.21 MIL/uL (ref 3.87–5.11)
RDW: 15 % (ref 11.5–15.5)
WBC: 6.7 10*3/uL (ref 4.0–10.5)

## 2011-03-01 LAB — T4, FREE: Free T4: 0.8 ng/dL (ref 0.80–1.80)

## 2011-03-01 MED ORDER — WARFARIN SODIUM 2.5 MG PO TABS
2.5000 mg | ORAL_TABLET | Freq: Once | ORAL | Status: AC
  Administered 2011-03-01: 2.5 mg via ORAL
  Filled 2011-03-01: qty 1

## 2011-03-01 MED ORDER — DEXTROSE 5 % IV SOLN
1.0000 g | INTRAVENOUS | Status: DC
  Administered 2011-03-01 – 2011-03-04 (×4): 1 g via INTRAVENOUS
  Filled 2011-03-01 (×5): qty 10

## 2011-03-01 NOTE — Progress Notes (Signed)
ANTICOAGULATION CONSULT NOTE - Follow Up Consult  Pharmacy Consult for coumadin Indication: atrial fibrillation and VTE prophylaxis  Allergies  Allergen Reactions  . Nitrofurantoin (Macrodantin) Itching    Patient Measurements: Height: 5\' 4"  (162.6 cm) Weight: 299 lb (135.626 kg) IBW/kg (Calculated) : 54.7    Vital Signs: Temp: 97.4 F (36.3 C) (01/04 0604) Temp src: Oral (01/04 0604) BP: 119/65 mmHg (01/04 1003) Pulse Rate: 58  (01/04 0604)  Labs:  Basename 03/01/11 0720 02/28/11 2057 02/28/11 1632  HGB 12.7 -- 14.7  HCT 39.4 -- 45.5  PLT 216 -- 287  APTT -- -- --  LABPROT 30.2* 29.1* --  INR 2.83* 2.70* --  HEPARINUNFRC -- -- --  CREATININE 1.37* -- 1.43*  CKTOTAL -- -- --  CKMB -- -- --  TROPONINI -- -- --   Estimated Creatinine Clearance: 48 ml/min (by C-G formula based on Cr of 1.37).   Medications:  Scheduled:    . amLODipine  5 mg Oral Daily  . antiseptic oral rinse  15 mL Mouth Rinse BID  . cefTRIAXone (ROCEPHIN) IVPB 1 gram/50 mL D5W  1 g Intravenous Q24H  . colchicine  0.6 mg Oral Daily  . FLUoxetine  20 mg Oral BID  . Fluticasone-Salmeterol  1 puff Inhalation Q12H  . furosemide  80 mg Oral Daily  . levothyroxine  125 mcg Oral QAC breakfast  . losartan  50 mg Oral BID  . montelukast  10 mg Oral QHS  . pantoprazole  40 mg Oral Q1200  . rosuvastatin  20 mg Oral Daily  . spironolactone  25 mg Oral Daily  . tiotropium  18 mcg Inhalation Daily  . tolterodine  2 mg Oral Daily  . warfarin  5 mg Oral Daily  . DISCONTD: enoxaparin (LOVENOX) injection  40 mg Subcutaneous Q24H    Assessment: INR 2.83.  No bleeding. Rocephin 1mg  q24 started for UTI.  No bleeding noted.  Goal of Therapy:  INR 2-3.  History of PE, DVT, and has a fib.   Plan:  Coumadin 2.5mg  today.  INR check in am.  Walden Field B 03/01/2011,10:14 AM

## 2011-03-01 NOTE — H&P (Signed)
63 woman, my clinic patient, with several chronic ills and many meds, admitted now for somnolence and periods of confusion.  She has had these spella many times over several years with periods of mental clarity in between.  Cause is often UTI and/or polypharmacy.  This seems to be another similar spell. She broke her fibula last month and has been on some vicodin.  Her son and a family friend have been setting out her meds but there is a question whether she always takes them.  Her INR has remained therapeutic but her TSH is 29 (FT4 = 0.8).  She has another UTI.  Today she is clear and her usual self with good humor.  She has mild memory loss but no different from past few years.  Most questions she answers quickly and accurately. Imp: Combination of UTI, vicodin, hypo-thyroidism, and age/chronic illness.  She may have mild dementia but it has progressed very slowly.  There may be questions of safety with current living arrangements, esp regarding her complex medication regimen.  Suggest SW consult and family conference.

## 2011-03-01 NOTE — Progress Notes (Signed)
Addended by: Remus Blake on: 03/01/2011 10:04 AM   Modules accepted: Orders

## 2011-03-01 NOTE — Progress Notes (Signed)
78yo female to begin Rocephin for UTI.  Will begin Rocephin 1g IV Q24H.  Vernard Gambles, PharmD, BCPS 03/01/2011 4:48 AM

## 2011-03-01 NOTE — Progress Notes (Signed)
Subjective: Pt feels better today. Is less confused than before. She is oriented to time, place and person.  Objective: Vital signs in last 24 hours: Filed Vitals:   02/28/11 2125 03/01/11 0604 03/01/11 1003 03/01/11 1411  BP: 130/62 133/67 119/65 125/72  Pulse: 50 58  55  Temp: 98.4 F (36.9 C) 97.4 F (36.3 C)  98.6 F (37 C)  TempSrc: Oral Oral    Resp: 20 18  18   Height:      Weight:      SpO2: 96% 98%  94%   Weight change:   Intake/Output Summary (Last 24 hours) at 03/01/11 1736 Last data filed at 03/01/11 1419  Gross per 24 hour  Intake 1816.67 ml  Output    550 ml  Net 1266.67 ml   Physical Exam: General: resting in bed, morbidly obese. HEENT: PERRL, EOMI, no scleral icterus Cardiac: Irregularly irregular, no rubs, murmurs or gallops Pulm: clear to auscultation bilaterally, moving normal volumes of air Abd: soft, nontender, nondistended, BS present Ext: warm and well perfused, no pedal edema Neuro: alert and oriented X3, cranial nerves II-XII grossly intact  Lab Results: Basic Metabolic Panel:  Lab 03/01/11 0454 02/28/11 1632  NA 134* 133*  K 4.7 5.1  CL 97 95*  CO2 26 28  GLUCOSE 191* 231*  BUN 28* 30*  CREATININE 1.37* 1.43*  CALCIUM 9.5 10.6*  MG -- --  PHOS -- --   Liver Function Tests:  Lab 02/28/11 1632  AST 31  ALT 51*  ALKPHOS 116  BILITOT 0.5  PROT 7.5  ALBUMIN 3.9   No results found for this basename: LIPASE:2,AMYLASE:2 in the last 168 hours No results found for this basename: AMMONIA:2 in the last 168 hours CBC:  Lab 03/01/11 0720 02/28/11 1632  WBC 6.7 8.3  NEUTROABS -- --  HGB 12.7 14.7  HCT 39.4 45.5  MCV 93.6 94.4  PLT 216 287   Cardiac Enzymes: No results found for this basename: CKTOTAL:3,CKMB:3,CKMBINDEX:3,TROPONINI:3 in the last 168 hours BNP: No results found for this basename: PROBNP:3 in the last 168 hours D-Dimer: No results found for this basename: DDIMER:2 in the last 168 hours CBG:  Lab 02/28/11 1443    GLUCAP 244*   Hemoglobin A1C:  Lab 02/28/11 1453  HGBA1C 8.6   Fasting Lipid Panel: No results found for this basename: CHOL,HDL,LDLCALC,TRIG,CHOLHDL,LDLDIRECT in the last 098 hours Thyroid Function Tests:  Lab 02/28/11 2057 02/28/11 1632  TSH -- 29.774*  T4TOTAL -- --  FREET4 0.80 --  T3FREE -- --  THYROIDAB -- --   Coagulation:  Lab 03/01/11 0720 02/28/11 2057  LABPROT 30.2* 29.1*  INR 2.83* 2.70*   Anemia Panel: No results found for this basename: VITAMINB12,FOLATE,FERRITIN,TIBC,IRON,RETICCTPCT in the last 168 hours Urine Drug Screen: Drugs of Abuse  No results found for this basename: labopia, cocainscrnur, labbenz, amphetmu, thcu, labbarb    Alcohol Level: No results found for this basename: ETH:2 in the last 168 hours Urinalysis: Results for KEYRI, SALBERG (MRN [id number]) as of 03/01/2011 17:51  Ref. Range 03/01/2011 03:45  Color, Urine Latest Range: YELLOW  YELLOW  APPearance Latest Range: CLEAR  CLOUDY (A)  Specific Gravity, Urine Latest Range: 1.005-1.030  1.010  pH Latest Range: 5.0-8.0  5.0  Glucose, UA Latest Range: NEGATIVE mg/dL NEGATIVE  Bilirubin Urine Latest Range: NEGATIVE  NEGATIVE  Ketones, ur Latest Range: NEGATIVE mg/dL NEGATIVE  Protein Latest Range: NEGATIVE mg/dL NEGATIVE  Urobilinogen, UA Latest Range: 0.0-1.0 mg/dL 0.2  Nitrite Latest Range: NEGATIVE  POSITIVE (A)  Leukocytes, UA Latest Range: NEGATIVE  SMALL (A)  WBC, UA Latest Range: <3 WBC/hpf 7-10  RBC / HPF Latest Range: <3 RBC/hpf 0-2  Squamous Epithelial / LPF Latest Range: RARE  RARE  Bacteria, UA Latest Range: RARE  MANY (A)  Casts Latest Range: NEGATIVE  HYALINE CASTS (A)    Micro Results: Recent Results (from the past 240 hour(s))  MRSA PCR SCREENING     Status: Normal   Collection Time   02/28/11  8:24 PM      Component Value Range Status Comment   MRSA by PCR NEGATIVE  NEGATIVE  Final    Studies/Results: No results found. Medications: I have reviewed the patient's  current medications. Scheduled Meds:   . amLODipine  5 mg Oral Daily  . antiseptic oral rinse  15 mL Mouth Rinse BID  . cefTRIAXone (ROCEPHIN) IVPB 1 gram/50 mL D5W  1 g Intravenous Q24H  . colchicine  0.6 mg Oral Daily  . FLUoxetine  20 mg Oral BID  . Fluticasone-Salmeterol  1 puff Inhalation Q12H  . furosemide  80 mg Oral Daily  . levothyroxine  125 mcg Oral QAC breakfast  . losartan  50 mg Oral BID  . montelukast  10 mg Oral QHS  . pantoprazole  40 mg Oral Q1200  . rosuvastatin  20 mg Oral Daily  . spironolactone  25 mg Oral Daily  . tiotropium  18 mcg Inhalation Daily  . tolterodine  2 mg Oral Daily  . warfarin  2.5 mg Oral ONCE-1800  . DISCONTD: enoxaparin (LOVENOX) injection  40 mg Subcutaneous Q24H  . DISCONTD: warfarin  5 mg Oral Daily   Continuous Infusions:   . sodium chloride 100 mL/hr at 03/01/11 0612   PRN Meds:.acetaminophen, albuterol, ondansetron, polyethylene glycol Assessment/Plan: Active Problems: 1) Altered mental status/Confusion: Likely secondary to baseline dementia versus UTI versus worsening hypothyroidism versus recent Vicodin use. UA and urine microscopy shows infection- patient started on ceftriaxone IV. Patient seems better today.   Plan: Continue IV ceftriaxone for 3 days. - Continue same dose of levothyroxine-125 micrograms daily. - Hold Vicodin or any other narcotics.  2) Acute UTI: Patient with recurrent UTIs.  Plan: Continue ceftriaxone until sensitivities back.  3) Hypothyroidism: Worsening TSH- 29.7- likely due to noncompliance.  Plan: Continue same dose of levothyroxine- 125 mcg daily.  4) Hypertension: Blood pressure stable. Continue home meds.  5) DVT/PE prophylaxis: Coumadin per pharmacy.      LOS: 1 day   Opha Mcghee 03/01/2011, 5:36 PM

## 2011-03-01 NOTE — Clinical Documentation Improvement (Signed)
BMI DOCUMENTATION CLARIFICATION QUERY  THIS DOCUMENT IS NOT A PERMANENT PART OF THE MEDICAL RECORD  TO RESPOND TO THE THIS QUERY, FOLLOW THE INSTRUCTIONS BELOW:  1. If needed, update documentation for the patients encounter via the notes activity.  2. Access this query again and click edit on the In [employer].  3. After updating, or not, click F2 to complete all highlighted (required) fields concerning your review. Select "additional documentation in the medical record" OR "no additional documentation provided".  4. Click Sign note button.  5. The deficiency will fall out of your In Basket *Please let us know if you are not able to complete this workflow by phone or e-mail (listed below).         03/01/11  Dear Dr. Lamar [facility]  In an effort to better capture your patient's severity of illness, reflect appropriate length of stay and utilization of resources, a review of the patient medical record has revealed the following indicators.    Based on your clinical judgment, please clarify and document in a progress note and/or discharge summary the clinical condition associated with the following supporting information:  In responding to this query please exercise your independent judgment.  The fact that a query is asked, does not imply that any particular answer is desired or expected.  Possible Clinical conditions  Morbid Obesity W/ BMI= 51.4  Other condition___________________  Cannot Clinically determine _____________  Risk Factors: Sign & Symptoms:Has h/o of morbid obesity.  Weight: 299 lbs. Height 72ft. 4in. BMI= 51.4      Reviewed:  no additional documentation provided.  D/c summary incomplete.    Thank You,  Shelda Pal RN, BSN, CCM   Clinical Documentation Specialist:  Pager [phone]   Health Information Management [place]

## 2011-03-02 LAB — PROTIME-INR
INR: 2.76 — ABNORMAL HIGH (ref 0.00–1.49)
Prothrombin Time: 29.6 seconds — ABNORMAL HIGH (ref 11.6–15.2)

## 2011-03-02 LAB — URINE CULTURE
Colony Count: >=100000
Culture  Setup Time: 201301040846

## 2011-03-02 MED ORDER — WARFARIN SODIUM 5 MG PO TABS
5.0000 mg | ORAL_TABLET | Freq: Once | ORAL | Status: AC
  Administered 2011-03-02: 5 mg via ORAL
  Filled 2011-03-02 (×2): qty 1

## 2011-03-02 NOTE — Progress Notes (Signed)
ANTICOAGULATION CONSULT NOTE - Follow Up Consult  Pharmacy Consult for Coumadin Indication: atrial fibrillation, h/o DVT/PE  Allergies  Allergen Reactions  . Nitrofurantoin (Macrodantin) Itching    Patient Measurements: Height: 5\' 4"  (162.6 cm) Weight: 299 lb (135.626 kg) IBW/kg (Calculated) : 54.7  Adjusted Body Weight:   Vital Signs: Temp: 98.9 F (37.2 C) (01/05 1425) Temp src: Oral (01/05 1425) BP: 142/62 mmHg (01/05 1425) Pulse Rate: 55  (01/05 1425)  Labs:  Basename 03/02/11 0530 03/01/11 0720 02/28/11 2057 02/28/11 1632  HGB -- 12.7 -- 14.7  HCT -- 39.4 -- 45.5  PLT -- 216 -- 287  APTT -- -- -- --  LABPROT 29.6* 30.2* 29.1* --  INR 2.76* 2.83* 2.70* --  HEPARINUNFRC -- -- -- --  CREATININE -- 1.37* -- 1.43*  CKTOTAL -- -- -- --  CKMB -- -- -- --  TROPONINI -- -- -- --   Estimated Creatinine Clearance: 48 ml/min (by C-G formula based on Cr of 1.37).   Medications:  Scheduled:    . amLODipine  5 mg Oral Daily  . antiseptic oral rinse  15 mL Mouth Rinse BID  . cefTRIAXone (ROCEPHIN) IVPB 1 gram/50 mL D5W  1 g Intravenous Q24H  . colchicine  0.6 mg Oral Daily  . FLUoxetine  20 mg Oral BID  . Fluticasone-Salmeterol  1 puff Inhalation Q12H  . furosemide  80 mg Oral Daily  . levothyroxine  125 mcg Oral QAC breakfast  . losartan  50 mg Oral BID  . montelukast  10 mg Oral QHS  . pantoprazole  40 mg Oral Q1200  . rosuvastatin  20 mg Oral Daily  . spironolactone  25 mg Oral Daily  . tiotropium  18 mcg Inhalation Daily  . tolterodine  2 mg Oral Daily  . warfarin  2.5 mg Oral ONCE-1800    Assessment: 78 y/o female patient with h/o afib/PE/DVT on chronic coumadin. INR therapeutic, no bleeding reported.  Goal of Therapy:  INR 2-3   Plan:  Coumadin 5mg  today and f/u in am.  Verlene Mayer, PharmD, BCPS Pager [phone] 03/02/2011,5:21 PM

## 2011-03-02 NOTE — Progress Notes (Signed)
Subjective: Pt feels better. She is oriented to time, place and person. She says that she does not remember anything for 2 days prior to admission Is aware of what's going on with her. Does not have any nausea vomiting, fever.  Objective: Vital signs in last 24 hours: Filed Vitals:   03/02/11 0418 03/02/11 0845 03/02/11 1039 03/02/11 1425  BP: 147/69  137/86 142/62  Pulse: 53   55  Temp: 98.3 F (36.8 C)   98.9 F (37.2 C)  TempSrc: Oral   Oral  Resp: 20   20  Height:      Weight:      SpO2: 96% 96%  95%   Weight change:  No intake or output data in the 24 hours ending 03/02/11 1509 Physical Exam: General: resting in bed, morbidly obese. HEENT: PERRL, EOMI, no scleral icterus Cardiac: Irregularly irregular, no rubs, murmurs or gallops Pulm: clear to auscultation bilaterally, moving normal volumes of air Abd: soft, nontender, nondistended, BS present Ext: warm and well perfused, no pedal edema Neuro: alert and oriented X3, cranial nerves II-XII grossly intact  Lab Results: Basic Metabolic Panel:  Lab 03/01/11 7829 02/28/11 1632  NA 134* 133*  K 4.7 5.1  CL 97 95*  CO2 26 28  GLUCOSE 191* 231*  BUN 28* 30*  CREATININE 1.37* 1.43*  CALCIUM 9.5 10.6*  MG -- --  PHOS -- --   Liver Function Tests:  Lab 02/28/11 1632  AST 31  ALT 51*  ALKPHOS 116  BILITOT 0.5  PROT 7.5  ALBUMIN 3.9     CBC:  Lab 03/01/11 0720 02/28/11 1632  WBC 6.7 8.3  NEUTROABS -- --  HGB 12.7 14.7  HCT 39.4 45.5  MCV 93.6 94.4  PLT 216 287    CBG:  Lab 02/28/11 1443  GLUCAP 244*   Hemoglobin A1C:  Lab 02/28/11 1453  HGBA1C 8.6   Fasting Lipid Panel: No results found for this basename: CHOL,HDL,LDLCALC,TRIG,CHOLHDL,LDLDIRECT in the last 562 hours Thyroid Function Tests:  Lab 02/28/11 2057 02/28/11 1632  TSH -- 29.774*  T4TOTAL -- --  FREET4 0.80 --  T3FREE -- --  THYROIDAB -- --   Coagulation:  Lab 03/02/11 0530 03/01/11 0720 02/28/11 2057  LABPROT 29.6* 30.2*  29.1*  INR 2.76* 2.83* 2.70*   Urinalysis: Results for DELL, HURTUBISE (MRN [id number]) as of 03/01/2011 17:51  Ref. Range 03/01/2011 03:45  Color, Urine Latest Range: YELLOW  YELLOW  APPearance Latest Range: CLEAR  CLOUDY (A)  Specific Gravity, Urine Latest Range: 1.005-1.030  1.010  pH Latest Range: 5.0-8.0  5.0  Glucose, UA Latest Range: NEGATIVE mg/dL NEGATIVE  Bilirubin Urine Latest Range: NEGATIVE  NEGATIVE  Ketones, ur Latest Range: NEGATIVE mg/dL NEGATIVE  Protein Latest Range: NEGATIVE mg/dL NEGATIVE  Urobilinogen, UA Latest Range: 0.0-1.0 mg/dL 0.2  Nitrite Latest Range: NEGATIVE  POSITIVE (A)  Leukocytes, UA Latest Range: NEGATIVE  SMALL (A)  WBC, UA Latest Range: <3 WBC/hpf 7-10  RBC / HPF Latest Range: <3 RBC/hpf 0-2  Squamous Epithelial / LPF Latest Range: RARE  RARE  Bacteria, UA Latest Range: RARE  MANY (A)  Casts Latest Range: NEGATIVE  HYALINE CASTS (A)    Micro Results: Recent Results (from the past 240 hour(s))  CULTURE, BLOOD (SINGLE)     Status: Normal (Preliminary result)   Collection Time   02/28/11  4:32 PM      Component Value Range Status Comment   Preliminary Report GRAM POSITIVE COCCI IN PAIRS AND CHAINS  Preliminary   CULTURE, BLOOD (SINGLE)     Status: Normal (Preliminary result)   Collection Time   02/28/11  4:40 PM      Component Value Range Status Comment   Preliminary Report Blood Culture received; No Growth to date;   Preliminary    Preliminary Report Culture will be held for 5 days before issuing   Preliminary    Preliminary Report a Final Negative report.   Preliminary   MRSA PCR SCREENING     Status: Normal   Collection Time   02/28/11  8:24 PM      Component Value Range Status Comment   MRSA by PCR NEGATIVE  NEGATIVE  Final   URINE CULTURE     Status: Normal   Collection Time   03/01/11  3:45 AM      Component Value Range Status Comment   Specimen Description URINE, CATHETERIZED   Final    Special Requests NONE   Final    Setup Time  [id number]   Final    Colony Count >=100,000 COLONIES/ML   Final    Culture ESCHERICHIA COLI   Final    Report Status 03/02/2011 FINAL   Final    Organism ID, Bacteria ESCHERICHIA COLI   Final    Studies/Results: No results found. Medications: I have reviewed the patient's current medications. Scheduled Meds:    . amLODipine  5 mg Oral Daily  . antiseptic oral rinse  15 mL Mouth Rinse BID  . cefTRIAXone (ROCEPHIN) IVPB 1 gram/50 mL D5W  1 g Intravenous Q24H  . colchicine  0.6 mg Oral Daily  . FLUoxetine  20 mg Oral BID  . Fluticasone-Salmeterol  1 puff Inhalation Q12H  . furosemide  80 mg Oral Daily  . levothyroxine  125 mcg Oral QAC breakfast  . losartan  50 mg Oral BID  . montelukast  10 mg Oral QHS  . pantoprazole  40 mg Oral Q1200  . rosuvastatin  20 mg Oral Daily  . spironolactone  25 mg Oral Daily  . tiotropium  18 mcg Inhalation Daily  . tolterodine  2 mg Oral Daily  . warfarin  2.5 mg Oral ONCE-1800   Continuous Infusions:  PRN Meds:.acetaminophen, albuterol, ondansetron, polyethylene glycol  Assessment/Plan: Active Problems: 1) Altered mental status/Confusion: Likely secondary to baseline dementia versus UTI versus worsening hypothyroidism versus recent Vicodin use. UA and urine microscopy shows infection- patient started on ceftriaxone IV. Patient seems and feels better.  Plan: Continue IV ceftriaxone for 3 days. - Continue same dose of levothyroxine-125 micrograms daily. - Hold Vicodin or any other narcotics.  2) Acute UTI: Patient with recurrent UTIs.  Plan: Continue ceftriaxone until sensitivities back. - > 100,000 colonies of Escherichia coli- no sensitivities yet.  3) Hypothyroidism: Worsening TSH- 29.7- likely due to noncompliance.  Plan: Continue same dose of levothyroxine- 125 mcg daily.  4) Hypertension: Blood pressure stable. Continue home meds.  5) 1/2 blood culture positive with gram-positive cocci in pairs and chains: - Talked with  microbiology lab- [facility]- likely skin contaminant- strep species. - Final result tomorrow morning.  5) DVT/PE prophylaxis: Coumadin per pharmacy.      LOS: 2 days   Lindalee Huizinga 03/02/2011, 3:09 PM

## 2011-03-03 LAB — PROTIME-INR
INR: 2.72 — ABNORMAL HIGH (ref 0.00–1.49)
Prothrombin Time: 29.3 seconds — ABNORMAL HIGH (ref 11.6–15.2)

## 2011-03-03 MED ORDER — WARFARIN SODIUM 5 MG PO TABS
5.0000 mg | ORAL_TABLET | Freq: Once | ORAL | Status: AC
  Administered 2011-03-03: 5 mg via ORAL
  Filled 2011-03-03: qty 1

## 2011-03-03 MED ORDER — DOCUSATE SODIUM 100 MG PO CAPS
100.0000 mg | ORAL_CAPSULE | Freq: Two times a day (BID) | ORAL | Status: DC
  Administered 2011-03-03 – 2011-03-04 (×3): 100 mg via ORAL
  Filled 2011-03-03 (×5): qty 1

## 2011-03-03 NOTE — Progress Notes (Signed)
Pt son called . Is medical POA. If MD needs to contact please call [phone]. Password is [email or web address]".

## 2011-03-03 NOTE — Progress Notes (Signed)
Subjective: Pt was confused around midnight- but is oriented since morning .She is oriented to time, place and person.  Patient does not remember being confused during the nighttime. Does not have any nausea vomiting, fever.  Objective: Vital signs in last 24 hours: Filed Vitals:   03/02/11 2135 03/03/11 0530 03/03/11 0928 03/03/11 1101  BP: 130/67 127/65  146/57  Pulse: 62 57    Temp: 98.2 F (36.8 C) 97.3 F (36.3 C)    TempSrc:      Resp: 20 20    Height:      Weight:      SpO2: 96% 97% 93%    Weight change:   Intake/Output Summary (Last 24 hours) at 03/03/11 1510 Last data filed at 03/03/11 1344  Gross per 24 hour  Intake    650 ml  Output      0 ml  Net    650 ml   Physical Exam: General: resting in bed, morbidly obese. HEENT: PERRL, EOMI, no scleral icterus Cardiac: Irregularly irregular, no rubs, murmurs or gallops Pulm: clear to auscultation bilaterally, moving normal volumes of air Abd: soft, nontender, nondistended, BS present Ext: warm and well perfused, no pedal edema Neuro: alert and oriented X3, cranial nerves II-XII grossly intact  Lab Results: Basic Metabolic Panel:  Lab 03/01/11 0454 02/28/11 1632  NA 134* 133*  K 4.7 5.1  CL 97 95*  CO2 26 28  GLUCOSE 191* 231*  BUN 28* 30*  CREATININE 1.37* 1.43*  CALCIUM 9.5 10.6*  MG -- --  PHOS -- --   Liver Function Tests:  Lab 02/28/11 1632  AST 31  ALT 51*  ALKPHOS 116  BILITOT 0.5  PROT 7.5  ALBUMIN 3.9     CBC:  Lab 03/01/11 0720 02/28/11 1632  WBC 6.7 8.3  NEUTROABS -- --  HGB 12.7 14.7  HCT 39.4 45.5  MCV 93.6 94.4  PLT 216 287    CBG:  Lab 02/28/11 1443  GLUCAP 244*   Hemoglobin A1C:  Lab 02/28/11 1453  HGBA1C 8.6   Fasting Lipid Panel: No results found for this basename: CHOL,HDL,LDLCALC,TRIG,CHOLHDL,LDLDIRECT in the last 098 hours Thyroid Function Tests:  Lab 02/28/11 2057 02/28/11 1632  TSH -- 29.774*  T4TOTAL -- --  FREET4 0.80 --  T3FREE -- --  THYROIDAB  -- --   Coagulation:  Lab 03/03/11 0748 03/02/11 0530 03/01/11 0720 02/28/11 2057  LABPROT 29.3* 29.6* 30.2* 29.1*  INR 2.72* 2.76* 2.83* 2.70*   Urinalysis: Results for ZEAH, GERMANO (MRN [id number]) as of 03/01/2011 17:51  Ref. Range 03/01/2011 03:45  Color, Urine Latest Range: YELLOW  YELLOW  APPearance Latest Range: CLEAR  CLOUDY (A)  Specific Gravity, Urine Latest Range: 1.005-1.030  1.010  pH Latest Range: 5.0-8.0  5.0  Glucose, UA Latest Range: NEGATIVE mg/dL NEGATIVE  Bilirubin Urine Latest Range: NEGATIVE  NEGATIVE  Ketones, ur Latest Range: NEGATIVE mg/dL NEGATIVE  Protein Latest Range: NEGATIVE mg/dL NEGATIVE  Urobilinogen, UA Latest Range: 0.0-1.0 mg/dL 0.2  Nitrite Latest Range: NEGATIVE  POSITIVE (A)  Leukocytes, UA Latest Range: NEGATIVE  SMALL (A)  WBC, UA Latest Range: <3 WBC/hpf 7-10  RBC / HPF Latest Range: <3 RBC/hpf 0-2  Squamous Epithelial / LPF Latest Range: RARE  RARE  Bacteria, UA Latest Range: RARE  MANY (A)  Casts Latest Range: NEGATIVE  HYALINE CASTS (A)    Micro Results: Recent Results (from the past 240 hour(s))  CULTURE, BLOOD (SINGLE)     Status: Normal (Preliminary result)  Collection Time   02/28/11  4:32 PM      Component Value Range Status Comment   Preliminary Report GRAM POSITIVE COCCI IN PAIRS AND CHAINS   Preliminary   CULTURE, BLOOD (SINGLE)     Status: Normal (Preliminary result)   Collection Time   02/28/11  4:40 PM      Component Value Range Status Comment   Preliminary Report Blood Culture received; No Growth to date;   Preliminary    Preliminary Report Culture will be held for 5 days before issuing   Preliminary    Preliminary Report a Final Negative report.   Preliminary   MRSA PCR SCREENING     Status: Normal   Collection Time   02/28/11  8:24 PM      Component Value Range Status Comment   MRSA by PCR NEGATIVE  NEGATIVE  Final   URINE CULTURE     Status: Normal   Collection Time   03/01/11  3:45 AM      Component Value Range  Status Comment   Specimen Description URINE, CATHETERIZED   Final    Special Requests NONE   Final    Setup Time [id number]   Final    Colony Count >=100,000 COLONIES/ML   Final    Culture ESCHERICHIA COLI   Final    Report Status 03/02/2011 FINAL   Final    Organism ID, Bacteria ESCHERICHIA COLI   Final    Studies/Results: No results found. Medications: I have reviewed the patient's current medications. Scheduled Meds:    . amLODipine  5 mg Oral Daily  . antiseptic oral rinse  15 mL Mouth Rinse BID  . cefTRIAXone (ROCEPHIN) IVPB 1 gram/50 mL D5W  1 g Intravenous Q24H  . colchicine  0.6 mg Oral Daily  . docusate sodium  100 mg Oral BID  . FLUoxetine  20 mg Oral BID  . Fluticasone-Salmeterol  1 puff Inhalation Q12H  . furosemide  80 mg Oral Daily  . levothyroxine  125 mcg Oral QAC breakfast  . losartan  50 mg Oral BID  . montelukast  10 mg Oral QHS  . pantoprazole  40 mg Oral Q1200  . rosuvastatin  20 mg Oral Daily  . spironolactone  25 mg Oral Daily  . tiotropium  18 mcg Inhalation Daily  . tolterodine  2 mg Oral Daily  . warfarin  5 mg Oral ONCE-1800   Continuous Infusions:  PRN Meds:.acetaminophen, albuterol, ondansetron, polyethylene glycol  Assessment/Plan: Active Problems: 1) Altered mental status/Confusion: Likely secondary to baseline dementia versus UTI versus worsening hypothyroidism versus recent Vicodin use. Urine culture shows Escherichia coli as #2. Patient looks and feels better- except episode of confusion overnight. Patient was not psychotic.  Plan: IV ceftriaxone day #3 - Continue same dose of levothyroxine-125 micrograms daily. - Hold Vicodin or any other narcotics.   2) Acute UTI: Patient with recurrent UTIs.   Plan: Continue ceftriaxone Day #3 - > 100,000 colonies of Escherichia coli- resident of ceftriaxone, amikacin, nitrofurantoin. - Patient cannot take nitrofurantoin due to allergic reaction-severe itching. - Will change to Ceftin po on  discharge- this was discussed with pharmacy.  3) Hypothyroidism: Worsening TSH- 29.7- likely due to noncompliance.  Plan: Continue same dose of levothyroxine- 125 mcg daily. - Followup TSH in 8-12 weeks in clinic.  4) Hypertension: Blood pressure stable. Continue home meds.  5) 1/2 blood culture positive with gram-positive cocci in pairs and chains: - Talked with microbiology lab 1/5- [facility]- likely skin contaminant-  strep species. - Final result pending.  5) DVT/PE prophylaxis: Coumadin per pharmacy.      LOS: 3 days   Jenna Ardoin 03/03/2011, 3:10 PM

## 2011-03-03 NOTE — Progress Notes (Signed)
ANTICOAGULATION CONSULT NOTE - Follow Up Consult  Pharmacy Consult for Coumadin Indication: atrial fibrillation, h/o DVT/PE  Allergies  Allergen Reactions  . Nitrofurantoin (Macrodantin) Itching    Patient Measurements: Height: 5\' 4"  (162.6 cm) Weight: 299 lb (135.626 kg) IBW/kg (Calculated) : 54.7  Adjusted Body Weight:   Vital Signs: Temp: 98.2 F (36.8 C) (01/06 1521) BP: 134/67 mmHg (01/06 1521) Pulse Rate: 70  (01/06 1521)  Labs:  Basename 03/03/11 0748 03/02/11 0530 03/01/11 0720 02/28/11 1632  HGB -- -- 12.7 14.7  HCT -- -- 39.4 45.5  PLT -- -- 216 287  APTT -- -- -- --  LABPROT 29.3* 29.6* 30.2* --  INR 2.72* 2.76* 2.83* --  HEPARINUNFRC -- -- -- --  CREATININE -- -- 1.37* 1.43*  CKTOTAL -- -- -- --  CKMB -- -- -- --  TROPONINI -- -- -- --   Estimated Creatinine Clearance: 48 ml/min (by C-G formula based on Cr of 1.37).   Medications:  Scheduled:     . amLODipine  5 mg Oral Daily  . antiseptic oral rinse  15 mL Mouth Rinse BID  . cefTRIAXone (ROCEPHIN) IVPB 1 gram/50 mL D5W  1 g Intravenous Q24H  . colchicine  0.6 mg Oral Daily  . docusate sodium  100 mg Oral BID  . FLUoxetine  20 mg Oral BID  . Fluticasone-Salmeterol  1 puff Inhalation Q12H  . furosemide  80 mg Oral Daily  . levothyroxine  125 mcg Oral QAC breakfast  . losartan  50 mg Oral BID  . montelukast  10 mg Oral QHS  . pantoprazole  40 mg Oral Q1200  . rosuvastatin  20 mg Oral Daily  . spironolactone  25 mg Oral Daily  . tiotropium  18 mcg Inhalation Daily  . tolterodine  2 mg Oral Daily  . warfarin  5 mg Oral ONCE-1800    Assessment: 78 yo female with h/o atrial fibrillation/PE/DVT on chronic coumadin. INR is at-goal.  Goal of Therapy:  INR 2-3   Plan:  1. Coumadin 5mg  po today.  2. Follow-up AM INR.  Lorre Munroe, PharmD. 03/03/2011,4:06 PM

## 2011-03-04 ENCOUNTER — Ambulatory Visit: Payer: Medicare Other

## 2011-03-04 LAB — PROTIME-INR
INR: 2.69 — ABNORMAL HIGH (ref 0.00–1.49)
Prothrombin Time: 29 seconds — ABNORMAL HIGH (ref 11.6–15.2)

## 2011-03-04 LAB — CULTURE, BLOOD (SINGLE)

## 2011-03-04 MED ORDER — WARFARIN SODIUM 5 MG PO TABS
5.0000 mg | ORAL_TABLET | Freq: Once | ORAL | Status: DC
  Filled 2011-03-04: qty 1

## 2011-03-04 NOTE — Progress Notes (Signed)
Pt disoriented this morning to place and situation. She thought she was in a student infirmy for her ankle injury.

## 2011-03-04 NOTE — Progress Notes (Signed)
Discharge Note:  S: patient has no complaints, is feeling well. She does not appear to be overtly confused, confirms with [place] that she is to go home on antibiotic pills. Nurse who saw her just after waking up noted that she was confused as to where she was. She also notes she has a meeting with her son and the doctor at 10:45. There was no such meeting.  O:  Filed Vitals:   03/04/11 0910  BP: 119/69  Pulse:   Temp:   Resp:   General: pleasant elderly woman resting in bed HEENT: PERRL, EOMI, no scleral icterus Cardiac: RRR, no rubs, murmurs or gallops Pulm: clear to auscultation bilaterally, moving normal volumes of air Abd: soft, nontender, nondistended, BS present Ext: warm and well perfused, no pedal edema Neuro: alert and oriented X3, cranial nerves II-XII grossly intact  A/P: will discharge to home with follow up with Dr. Aundria Rud in clinic February 1st at 10:30am.

## 2011-03-04 NOTE — Progress Notes (Signed)
Patient discharge teaching given with son/caregiver present, including activity, diet, follow-up appoints, and medications. Patient verbalized understanding of all discharge instructions. IV access was d/c'd. Vitals are stable. Skin is intact except as charted in most recent assessments. Pt to be escorted out by NT, to be driven home by son.

## 2011-03-04 NOTE — Progress Notes (Signed)
ANTICOAGULATION CONSULT NOTE - Follow Up Consult  Pharmacy Consult for Coumadin Indication: atrial fibrillation, h/o DVT/PE  Allergies  Allergen Reactions  . Nitrofurantoin (Macrodantin) Itching    Patient Measurements: Height: 5\' 4"  (162.6 cm) Weight: 299 lb (135.626 kg) IBW/kg (Calculated) : 54.7   Vital Signs: Temp: 97.5 F (36.4 C) (01/07 0525) BP: 119/69 mmHg (01/07 0910) Pulse Rate: 55  (01/07 0525)  Labs:  Alvira Philips 03/04/11 2130 03/03/11 0748 03/02/11 0530  HGB -- -- --  HCT -- -- --  PLT -- -- --  APTT -- -- --  LABPROT 29.0* 29.3* 29.6*  INR 2.69* 2.72* 2.76*  HEPARINUNFRC -- -- --  CREATININE -- -- --  CKTOTAL -- -- --  CKMB -- -- --  TROPONINI -- -- --   Estimated Creatinine Clearance: 48 ml/min (by C-G formula based on Cr of 1.37).   Medications:  Scheduled:     . amLODipine  5 mg Oral Daily  . antiseptic oral rinse  15 mL Mouth Rinse BID  . cefTRIAXone (ROCEPHIN) IVPB 1 gram/50 mL D5W  1 g Intravenous Q24H  . colchicine  0.6 mg Oral Daily  . docusate sodium  100 mg Oral BID  . FLUoxetine  20 mg Oral BID  . Fluticasone-Salmeterol  1 puff Inhalation Q12H  . furosemide  80 mg Oral Daily  . levothyroxine  125 mcg Oral QAC breakfast  . losartan  50 mg Oral BID  . montelukast  10 mg Oral QHS  . pantoprazole  40 mg Oral Q1200  . rosuvastatin  20 mg Oral Daily  . spironolactone  25 mg Oral Daily  . tiotropium  18 mcg Inhalation Daily  . tolterodine  2 mg Oral Daily  . warfarin  5 mg Oral ONCE-1800    Assessment: 78 yo female with h/o atrial fibrillation/PE/DVT on chronic coumadin. INR is at-goal.  Goal of Therapy:  INR 2-3   Plan:  1. Coumadin 5mg  po today.  2. Follow-up AM INR.  Wendie Simmer, PharmD, BCPS Clinical Pharmacist  Pager: [phone]  03/04/2011,2:51 PM

## 2011-03-04 NOTE — Discharge Summary (Signed)
Internal Medicine Teaching [facility] Discharge Note  Name: [name] MRN: [id number] DOB: 1932/06/11 78 y.o.  Date of Admission: 02/28/2011  5:27 PM Date of Discharge: 03/04/2011 Attending Physician: Dr. Aundria Rud  Discharge Diagnosis: 1) Altered mental status/Confusion: Likely secondary to UTI, patient returned to baseline after treated with ceftriaxone. 2) Acute UTI: Patient with recurrent UTIs, found to be > 100,000 colonies of Escherichia coli, sensitive to ceftriaxone. 3) Hypothyroidism: Worsening TSH- 29.7- likely due to noncompliance.  Plan: Continue same dose of levothyroxine- 125 mcg daily.  4) Hypertension: Blood pressure stable on home meds 5) 1/2 blood culture positive with gram-positive cocci in pairs and chains: likely contaminant  Discharge Medications: Discharge Medication List as of 03/04/2011  3:17 PM    CONTINUE these medications which have NOT CHANGED   Details  acetaminophen (TYLENOL) 500 MG tablet Take 1,000 mg by mouth 2 (two) times daily as needed. For pain., Until Discontinued, Historical Med    albuterol (PROVENTIL HFA) 108 (90 BASE) MCG/ACT inhaler Inhale 2 puffs into the lungs every 6 (six) hours as needed. For wheezing., Until Discontinued, Historical Med    amLODipine (NORVASC) 5 MG tablet Take 5 mg by mouth daily.  , Until Discontinued, Historical Med    atorvastatin (LIPITOR) 40 MG tablet Take 40 mg by mouth daily.  , Until Discontinued, Historical Med    COLCRYS 0.6 MG tablet Take 1 tablet by mouth Daily., Starting 12/30/2010, Until Discontinued, Historical Med    FLUoxetine (PROZAC) 20 MG capsule Take 20 mg by mouth 2 (two) times daily.  , Starting 10/30/2010, Until Discontinued, Historical Med    Fluticasone-Salmeterol (ADVAIR) 250-50 MCG/DOSE AEPB Inhale 1 puff into the lungs every 12 (twelve) hours.  , Until Discontinued, Historical Med    furosemide (LASIX) 40 MG tablet Take 80 mg by mouth daily.  , Until Discontinued, Historical Med      levothyroxine (SYNTHROID, LEVOTHROID) 125 MCG tablet Take 125 mcg by mouth daily.  , Until Discontinued, Historical Med    losartan (COZAAR) 50 MG tablet Take 50 mg by mouth 2 (two) times daily.  , Until Discontinued, Historical Med    montelukast (SINGULAIR) 10 MG tablet Take 10 mg by mouth at bedtime.  , Starting 11/02/2010, Until Discontinued, Historical Med    omeprazole (PRILOSEC) 40 MG capsule Take 40 mg by mouth 2 (two) times daily.  , Until Discontinued, Historical Med    ondansetron (ZOFRAN-ODT) 4 MG disintegrating tablet Take 4 mg by mouth every 12 (twelve) hours as needed. For nausea., Starting 11/20/2010, Until Discontinued, Historical Med    polyethylene glycol (MIRALAX / GLYCOLAX) packet Take 17 g by mouth daily as needed. For constipation. , Until Discontinued, Historical Med    spironolactone (ALDACTONE) 25 MG tablet Take 25 mg by mouth daily.  , Starting 09/18/2010, Until Discontinued, Historical Med    tiotropium (SPIRIVA) 18 MCG inhalation capsule Place 18 mcg into inhaler and inhale daily.  , Starting 10/24/2010, Until Discontinued, Historical Med    tolterodine (DETROL LA) 2 MG 24 hr capsule Take 2 mg by mouth daily.  , Until Discontinued, Historical Med    trimethoprim (TRIMPEX) 100 MG tablet Take 1 tablet by mouth Daily., Starting 02/02/2011, Until Discontinued, Historical Med    warfarin (COUMADIN) 5 MG tablet Take 5 mg by mouth daily.  , Until Discontinued, Historical Med      STOP taking these medications     zolpidem (AMBIEN) 10 MG tablet        Disposition and  follow-up:   [name].    Follow-up Appointments:  Discharge Orders    Future Appointments: Provider: Department: Dept Phone: Center:   03/29/2011 10:30 AM [facility] [phone] [facility]   04/17/2011 7:30 AM Sherrie George, MD [facility] [phone] None     Future Orders Please Complete  By Expires   Diet - low sodium heart healthy      Increase activity slowly      Discharge instructions      Comments:   Please try to avoid sedating medications such as vicodin, ambien, benadryl etc so that they do not worsen any confusion   Call MD for:  temperature >100.4      Call MD for:      Comments:   If you have pain with urination or notice a change in the color of your urine.     Consultations:  none  Procedures Performed:  Dg Ankle Complete Left  02/10/2011  *RADIOLOGY REPORT*  Clinical Data: Pain  LEFT ANKLE COMPLETE - 3+ VIEW  Comparison: None  Findings:  There is mild diffuse soft tissue swelling.  There is a minimally displaced fracture deformity involving the tip of the lateral malleolus.  No additional fractures or subluxations identified.  Small posterior calcaneal heel spur noted.  IMPRESSION:  1.  Tip of the lateral malleolus fracture. 2.  Soft tissue swelling.  Original Report Authenticated By: Rosealee Albee, M.D.   Admission HPI:  Patient is a 78 yo female with extensive past medical history including stroke, DVT, PE, hypothyroidism and multiple UTIs who is presenting with new onset of confusion and increased fatigue for 4 days. Patient's son and caretaker report patient has been forgetting to take her medications this week and even forgetting whether she has eaten or not sometimes. Patient says she does not recall this though she does endorse that she has trouble remembering things like where he son is going, though that is not new. She and her son both report that she has been sleeping a lot more and not wanting to get out of bed this week and patient says it is because she is more tired than usual. Both report this is unusual behavior. Her mobility is also compromised and she requires assisstance to get around the house, however, this correlates to her foot fracture 2 weeks ago. She has been using a walker and pain medication (vicodin) for the past week and a half. Her  history regarding her pain level is somewhat inconsistent as she says she needs more pain medication, but that it doesn't hurt and that she has plenty of meds at home. Son also reports new shortness of breath with normal activities like walking in her home. At baseline, patient can change her clothes, eat and and go to the bathroom independently although she needs help with showering and cooking. She has has a home health aide 4X a week for 2 hours a day who helps with these activities. Recent medicine changes include vicodin for foot fracture and change in thyroid medication dose 6-7 weeks ago. She is on 2L of oxygen at home and uses a wheelchair at baseline. She denies fever, chills and diarrhea though she reports bruning with urination and rectal pain. She has a history of self-reducing hemorrhoids.  Hospital Course by problem list: 1) Altered mental status/Confusion: Likely secondary to UTI, patient was initially quite confused as why she  was being admitted to the hospital and could not remember the events of the last few days when she had been confused. A MMSE was performed on the day of discharge and she scored a 24/30. She has some baseline dementia that her son describes as the patient forgetting where he went when he leaves the house. She became much more talkative and friendly by her discharge. 2) Acute UTI: Patient with recurrent UTIs that are resistant to ciprofloxacin. Culture came back > 100,000 colonies of Escherichia coli- discussed with pharmacy and would likely be sensitive to ceftin based on previous sensitivities. Does not seem that I prescribed ceftin in error on discharge 1/7. Contacted patient's son 1/10 and prescribed 7 additional days of ceftin to [place]. I told him he could fill this prescription if she is having symptoms of dysuria or lower abdominal pain but he did not have to fill it if she is asymptomatic since she had 5 days of treatment with IV ceftriaxone while an inpatient. 3)  Hypothyroidism: Worsening TSH- 29.7- likely due to noncompliance.  Plan: Continue same dose of levothyroxine- 125 mcg daily. Patient takes medicines with her son in the morning so compliance may not be a problem. Can reinforce the importance of this and recheck TSH once she has been in her normal mental status. 4) Hypertension: Blood pressure stable, did not make changes to regimen. 5) 1/2 blood culture positive with gram-positive cocci in pairs and chains:  - Talked with microbiology lab- [facility]- likely skin contaminant- strep species.  Discharge Vitals:  BP 131/70  Pulse 60  Temp(Src) 98.1 F (36.7 C) (Oral)  Resp 20  Ht 5\' 4"  (1.626 m)  Wt 299 lb (135.626 kg)  BMI 51.32 kg/m2  SpO2 98%  LMP 05/22/1968  Discharge Labs:  No results found for this or any previous visit (from the past 24 hour(s)).  SignedMargorie John 03/07/2011, 2:29 PM

## 2011-03-07 ENCOUNTER — Telehealth: Payer: Self-pay | Admitting: *Deleted

## 2011-03-07 LAB — CULTURE, BLOOD (SINGLE): Organism ID, Bacteria: NO GROWTH

## 2011-03-07 MED ORDER — CEFUROXIME AXETIL 250 MG PO TABS: 250.0000 mg | ORAL_TABLET | Freq: Two times a day (BID) | ORAL | Status: AC

## 2011-03-07 NOTE — Telephone Encounter (Signed)
Called [phone] approved Prilosec 20mg  CR 02/26/11 to 03/18/12. [facility] aware. Stanton Kidney Koki Buxton RN 03/07/11 9:30AM

## 2011-03-08 ENCOUNTER — Telehealth: Payer: Self-pay | Admitting: Licensed Clinical Social Worker

## 2011-03-08 NOTE — Telephone Encounter (Signed)
Son [name] [facility]) looking to explore care options for mother.  Patient is Medicare only and draws 1,500 per month including pension.   She has cash assets in the bank.    Patient lives with other son who works and helps care for patient as best he can.  Son [name] lives in [place] and wants to do some planning for mother's care.   Educated son about [facility] if they would like to keep mother at home versus Assisted Living.  Sending AL list to Tony's home address so he and his brother can further explore.   Will email PACE information so he can call for referral information and cost.   [email or web address] is email.     Address is [address] in [place]  [address]

## 2011-03-27 ENCOUNTER — Other Ambulatory Visit: Payer: Self-pay

## 2011-03-27 ENCOUNTER — Emergency Department (HOSPITAL_COMMUNITY)
Admission: EM | Admit: 2011-03-27 | Discharge: 2011-03-28 | Disposition: A | Discharge status: Home / Self Care | Payer: Medicare Other | Attending: Emergency Medicine | Admitting: Emergency Medicine

## 2011-03-27 ENCOUNTER — Encounter (HOSPITAL_COMMUNITY): Payer: Self-pay | Admitting: Emergency Medicine

## 2011-03-27 ENCOUNTER — Emergency Department (HOSPITAL_COMMUNITY): Payer: Medicare Other

## 2011-03-27 DIAGNOSIS — F329 Major depressive disorder, single episode, unspecified: Secondary | ICD-10-CM | POA: Insufficient documentation

## 2011-03-27 DIAGNOSIS — Z86718 Personal history of other venous thrombosis and embolism: Secondary | ICD-10-CM | POA: Insufficient documentation

## 2011-03-27 DIAGNOSIS — E785 Hyperlipidemia, unspecified: Secondary | ICD-10-CM | POA: Insufficient documentation

## 2011-03-27 DIAGNOSIS — I4891 Unspecified atrial fibrillation: Secondary | ICD-10-CM | POA: Insufficient documentation

## 2011-03-27 DIAGNOSIS — M67919 Unspecified disorder of synovium and tendon, unspecified shoulder: Secondary | ICD-10-CM | POA: Insufficient documentation

## 2011-03-27 DIAGNOSIS — F3289 Other specified depressive episodes: Secondary | ICD-10-CM | POA: Insufficient documentation

## 2011-03-27 DIAGNOSIS — E119 Type 2 diabetes mellitus without complications: Secondary | ICD-10-CM | POA: Insufficient documentation

## 2011-03-27 DIAGNOSIS — M1990 Unspecified osteoarthritis, unspecified site: Secondary | ICD-10-CM

## 2011-03-27 DIAGNOSIS — M75100 Unspecified rotator cuff tear or rupture of unspecified shoulder, not specified as traumatic: Secondary | ICD-10-CM

## 2011-03-27 DIAGNOSIS — M25519 Pain in unspecified shoulder: Secondary | ICD-10-CM | POA: Insufficient documentation

## 2011-03-27 DIAGNOSIS — I2510 Atherosclerotic heart disease of native coronary artery without angina pectoris: Secondary | ICD-10-CM | POA: Insufficient documentation

## 2011-03-27 DIAGNOSIS — K219 Gastro-esophageal reflux disease without esophagitis: Secondary | ICD-10-CM | POA: Insufficient documentation

## 2011-03-27 DIAGNOSIS — E039 Hypothyroidism, unspecified: Secondary | ICD-10-CM | POA: Insufficient documentation

## 2011-03-27 DIAGNOSIS — Z8639 Personal history of other endocrine, nutritional and metabolic disease: Secondary | ICD-10-CM | POA: Insufficient documentation

## 2011-03-27 DIAGNOSIS — M719 Bursopathy, unspecified: Secondary | ICD-10-CM | POA: Insufficient documentation

## 2011-03-27 DIAGNOSIS — I252 Old myocardial infarction: Secondary | ICD-10-CM | POA: Insufficient documentation

## 2011-03-27 DIAGNOSIS — Z79899 Other long term (current) drug therapy: Secondary | ICD-10-CM | POA: Insufficient documentation

## 2011-03-27 DIAGNOSIS — Z862 Personal history of diseases of the blood and blood-forming organs and certain disorders involving the immune mechanism: Secondary | ICD-10-CM | POA: Insufficient documentation

## 2011-03-27 DIAGNOSIS — Z7901 Long term (current) use of anticoagulants: Secondary | ICD-10-CM | POA: Insufficient documentation

## 2011-03-27 DIAGNOSIS — M129 Arthropathy, unspecified: Secondary | ICD-10-CM | POA: Insufficient documentation

## 2011-03-27 DIAGNOSIS — I10 Essential (primary) hypertension: Secondary | ICD-10-CM | POA: Insufficient documentation

## 2011-03-27 DIAGNOSIS — Z86711 Personal history of pulmonary embolism: Secondary | ICD-10-CM | POA: Insufficient documentation

## 2011-03-27 MED ORDER — ACETAMINOPHEN-CODEINE #3 300-30 MG PO TABS: 1.0000 | ORAL_TABLET | Freq: Four times a day (QID) | ORAL | Status: AC | PRN

## 2011-03-27 MED ORDER — ACETAMINOPHEN-CODEINE #3 300-30 MG PO TABS: 1.0000 | ORAL_TABLET | Freq: Once | ORAL | Status: DC

## 2011-03-27 MED ORDER — OXYCODONE-ACETAMINOPHEN 5-325 MG PO TABS
2.0000 | ORAL_TABLET | Freq: Once | ORAL | Status: AC
  Administered 2011-03-27: 2 via ORAL
  Filled 2011-03-27: qty 2

## 2011-03-27 NOTE — ED Provider Notes (Signed)
History     CSN: [id number]  Arrival date & time 03/27/11  2138   First MD Initiated Contact with Patient 03/27/11 2151      Chief Complaint  Patient presents with  . Shoulder Pain    (Consider location/radiation/quality/duration/timing/severity/associated sxs/prior treatment) HPI  Patient presents to emergency department complaining of left and right shoulder pain but left shoulder pain greatest by EMS. Patient states that she injured both of her shoulders many years ago however during her recent hospitalization at the beginning of January she states that she was required to pull herself up in bed as well as doing a lot of moving about the cause strain on her shoulders as been having intermittent pain since then. Patient states pain is aggravated by movement and improved with rest however there is constant baseline pain. Patient has not been taking anything at home for the pain. Patient denies any radiation of pain. Patient denies redness, swelling, or heat from her shoulders. She denies chest pain, shortness of breath, nausea, vomiting, abdominal pain, fevers, chills, or cough. Patient states she is followed by outpatient clinics. Symptoms are gradual onset, intermittent, and persistent. Patient is on Coumadin for chronic A. fib.  Past Medical History  Diagnosis Date  . CAD (coronary artery disease)     s/p remote MI (20+ yrs ago)  . Abdominal pain     recent admission, likely secondary to presbyesophagus and gastric dysmotility  . CVD (cardiovascular disease)   . Diastolic dysfunction     with dilated cardiomyopathy, last EF 55%(09/2006)  . Respiratory failure, chronic     mixed etiology with bronchospastic component  . Urinary incontinence     recurrent uti's/resistance to cipro, bactrim  . Gout   . Hypothyroidism   . Hyperlipidemia   . Depression   . Morbidly obese     s/p gastric bypass  . Gastroesophageal reflux disease   . Ventral hernia     repair in April 2008,  complicated by MRSA abdominal wall cellulitis  . DVT (deep venous thrombosis)   . Pulmonary embolism     1998, s/p Greenfield filter  . Diabetes mellitus   . Hypertension   . Pyelonephritis   . History of recurrent UTIs   . AF (atrial fibrillation)     on chronic warfarin  . Osteoarthritis   . Myocardial infarction   . Stroke     denies residual  . Blood transfusion   . Anemia     Past Surgical History  Procedure Date  . Cholecystectomy   . Appendectomy   . Breast biopsy   . Gastric bypass 1970's  . Hernia repair 05/2006    ventral hernia  . Greenfield filter placement 1998    Family History  Problem Relation Age of Onset  . Colon cancer Neg Hx     History  Substance Use Topics  . Smoking status: Former Smoker -- 1.0 packs/day for 1.5 years    Types: Cigarettes  . Smokeless tobacco: Never Used  . Alcohol Use: No    OB History    Grav Para Term Preterm Abortions TAB SAB Ect Mult Living                  Review of Systems  All other systems reviewed and are negative.    Allergies  Nitrofurantoin  Home Medications   Current Outpatient Rx  Name Route Sig Dispense Refill  . ACETAMINOPHEN 500 MG PO TABS Oral Take 1,000 mg by mouth 2 (two)  times daily as needed. For pain.    . ALBUTEROL SULFATE HFA 108 (90 BASE) MCG/ACT IN AERS Inhalation Inhale 2 puffs into the lungs every 6 (six) hours as needed. For wheezing.    Marland Kitchen AMLODIPINE BESYLATE 5 MG PO TABS Oral Take 5 mg by mouth daily.      . ATORVASTATIN CALCIUM 40 MG PO TABS Oral Take 40 mg by mouth daily.      Marland Kitchen COLCRYS 0.6 MG PO TABS Oral Take 1 tablet by mouth Daily.    Marland Kitchen FLUOXETINE HCL 20 MG PO CAPS Oral Take 20 mg by mouth 2 (two) times daily.      Marland Kitchen FLUTICASONE-SALMETEROL 250-50 MCG/DOSE IN AEPB Inhalation Inhale 1 puff into the lungs every 12 (twelve) hours.      . FUROSEMIDE 40 MG PO TABS Oral Take 80 mg by mouth daily.      Marland Kitchen LEVOTHYROXINE SODIUM 125 MCG PO TABS Oral Take 125 mcg by mouth daily.      Marland Kitchen  LOSARTAN POTASSIUM 50 MG PO TABS Oral Take 50 mg by mouth 2 (two) times daily.      Marland Kitchen MONTELUKAST SODIUM 10 MG PO TABS Oral Take 10 mg by mouth at bedtime.      . OMEPRAZOLE 40 MG PO CPDR Oral Take 40 mg by mouth 2 (two) times daily.      Marland Kitchen ONDANSETRON 4 MG PO TBDP Oral Take 4 mg by mouth every 12 (twelve) hours as needed. For nausea.    Marland Kitchen POLYETHYLENE GLYCOL 3350 PO PACK Oral Take 17 g by mouth daily as needed. For constipation.     . SPIRONOLACTONE 25 MG PO TABS Oral Take 25 mg by mouth daily.      Marland Kitchen TIOTROPIUM BROMIDE MONOHYDRATE 18 MCG IN CAPS Inhalation Place 18 mcg into inhaler and inhale daily.      . TOLTERODINE TARTRATE ER 2 MG PO CP24 Oral Take 2 mg by mouth daily.      Marland Kitchen TRIMETHOPRIM 100 MG PO TABS Oral Take 1 tablet by mouth Daily.    . WARFARIN SODIUM 5 MG PO TABS Oral Take 5 mg by mouth daily.        BP 149/68  Pulse 63  Temp(Src) 98.6 F (37 C) (Oral)  Resp 15  SpO2 97%  LMP 05/22/1968  Physical Exam  Nursing note and vitals reviewed. Constitutional: She is oriented to person, place, and time. She appears well-developed and well-nourished. No distress.       Morbidly obese  HENT:  Head: Normocephalic and atraumatic.  Eyes: Conjunctivae are normal.  Neck: Normal range of motion. Neck supple.  Cardiovascular: Normal rate, regular rhythm, normal heart sounds and intact distal pulses.  Exam reveals no gallop and no friction rub.   No murmur heard. Pulmonary/Chest: Effort normal and breath sounds normal. No respiratory distress. She has no wheezes. She has no rales. She exhibits no tenderness.  Abdominal: Bowel sounds are normal. She exhibits no distension and no mass. There is no tenderness. There is no rebound and no guarding.  Musculoskeletal: Normal range of motion. She exhibits tenderness. She exhibits no edema.       Tenderness to palpation and tenderness with full range of motion of bilateral shoulders but no crepitus, skin changes, erythema, edema, or heat. Good  radial pulses bilaterally. Normal cap refill. Normal sensation of entire upper extremities.  Neurological: She is alert and oriented to person, place, and time.  Skin: Skin is warm and dry. No rash  noted. She is not diaphoretic. No erythema.  Psychiatric: She has a normal mood and affect.    ED Course  Procedures (including critical care time)  PO percocet   Date: 03/27/2011  Rate: 60  Rhythm: atrial fibrillation  QRS Axis: normal  Intervals: normal  ST/T Wave abnormalities: nonspecific T wave changes  Conduction Disutrbances:nonspecific intraventricular conduction delay  Narrative Interpretation:   Old EKG Reviewed: non provocative EKG without signicant changes from 11/06/10     Labs Reviewed  PROTIME-INR   Dg Chest 2 View  03/27/2011  *RADIOLOGY REPORT*  Clinical Data: Shoulder pain  CHEST - 2 VIEW  Comparison: 10/02/2010  Findings: Shallow inspiration.  Heart size and pulmonary vascularity are normal for technique.  Linear opacities in the mid lungs probably due to vascular crowding.  Atelectasis is not excluded.  No focal airspace consolidation.  No blunting of costophrenic angles.  No pneumothorax.  Calcification of the aorta. Postoperative changes in the upper abdomen.  Degenerative changes in the shoulders.  IMPRESSION: Shallow inspiration with linear vascular crowding or atelectasis in the mid lungs.  No active consolidation.  Original Report Authenticated By: Marlon Pel, M.D.   Dg Shoulder Left  03/27/2011  *RADIOLOGY REPORT*  Clinical Data: Left shoulder pain and chest pain.  LEFT SHOULDER - 2+ VIEW  Comparison: None.  Findings: Marked degenerative narrowing of the glenohumeral joint with mild hypertrophic changes.  Degenerative changes in the acromioclavicular joint.  Decreased sub acromial space with near bone on bone appearance.  Changes suggest chronic rotator cuff arthropathy.  The humeral head appears to be located somewhat posteriorly in the glenohumeral joint  on the scapular Y view and posterior dislocation is not excluded.  Correlation with physical examination is recommended.  No acute fractures.  No focal bone lesion or bone destruction.  IMPRESSION: Degenerative changes in the left shoulder with loss of sub acromial space suggesting chronic rotator cuff arthropathy.  Posterior dislocation is not excluded.  Original Report Authenticated By: Marlon Pel, M.D.     1. Shoulder pain   2. Arthritis   3. Rotator cuff syndrome       MDM  Coumadin with therapeutic. Left shoulder pain with arthritic changes with pain aggravated by movement and improved with rest. Will treat pain and have patient follow up with PCP for recheck this well,         Jenness Corner, PA 03/27/11 2353

## 2011-03-27 NOTE — ED Provider Notes (Signed)
Medical screening examination/treatment/procedure(s) were conducted as a shared visit with non-physician practitioner(s) and myself.  I personally evaluated the patient during the encounter. Shoulder pain, likely musculoskeletal. Workup otherwise negative. She be discharged  Juliet Rude. Rubin Payor, MD 03/27/11 2358

## 2011-03-27 NOTE — ED Notes (Signed)
Received report. Pt returned from radiology. Pt noted alert and oriented and in no acute distress. Reports rt shoulder pain 10/10. Converses with ease. Resp are unlabored. Skin is warm and dry. No obvious deformity or injury to shoulder noted. Good radial pulse and brisk cap refill noted. Pt medicated for pain. Sr up. No acute distress is noted.

## 2011-03-27 NOTE — ED Notes (Signed)
Pt to ED via [facility] with c/o pain in right shoulder for approx 2 weeks.  Pt st's she injured both shoulders yrs ago.  Pain increases with movement.   No deformity noted

## 2011-03-28 NOTE — ED Notes (Signed)
Son called to pick up patient upon discharge.

## 2011-03-29 ENCOUNTER — Ambulatory Visit (INDEPENDENT_AMBULATORY_CARE_PROVIDER_SITE_OTHER): Payer: Medicare Other | Admitting: Internal Medicine

## 2011-03-29 ENCOUNTER — Other Ambulatory Visit: Payer: Self-pay | Admitting: *Deleted

## 2011-03-29 ENCOUNTER — Telehealth: Payer: Self-pay | Admitting: Pharmacist

## 2011-03-29 ENCOUNTER — Encounter: Payer: Self-pay | Admitting: Internal Medicine

## 2011-03-29 VITALS — BP 106/73 | HR 59 | Temp 97.0°F

## 2011-03-29 DIAGNOSIS — E119 Type 2 diabetes mellitus without complications: Secondary | ICD-10-CM

## 2011-03-29 DIAGNOSIS — F329 Major depressive disorder, single episode, unspecified: Secondary | ICD-10-CM

## 2011-03-29 MED ORDER — DOXYCYCLINE HYCLATE 100 MG PO CAPS: 100.0000 mg | ORAL_CAPSULE | Freq: Two times a day (BID) | ORAL | Status: AC

## 2011-03-29 MED ORDER — GLIPIZIDE 10 MG PO TABS: 10.0000 mg | ORAL_TABLET | Freq: Two times a day (BID) | ORAL | Status: DC

## 2011-03-29 MED ORDER — AMLODIPINE BESYLATE 5 MG PO TABS: 5.0000 mg | ORAL_TABLET | Freq: Every day | ORAL | Status: DC

## 2011-03-29 NOTE — Progress Notes (Signed)
71 woman with several chronic ills and many meds.  She is morbidly obese and totally sedentary.  Her glucose has been elevated for at least a year and her A1c was 8.6 a few months ago.  Each visit has been for an acute symptom and we (myself and other doctors) have delayed starting hypoglycemic meds.  This certainly is overdue.  Will start glipizide 10.  She will likely need more in near future.  Will see her in 3-4 weeks.  No need to start home BG monitoring yet as hypoglycemia is very unlikely. She also has several infected follicular lesions on abd wall which she continues to excoriate.  Will use doxycycline 100 bid and advise benedryl hs for pruritis.  Will ask Dr. Alexandria Lodge to consider impact on warfarin dose. She has been having shoulder pain and had her left shoulder injected recently by an orthopedist with some relief. Now her right shoulder has similar pain.  Will refer her to her orthopedist for this.

## 2011-03-29 NOTE — Telephone Encounter (Signed)
INR last documented was 3.06 on 35mg /wk warfarin. Starting doxycyline today per Dr. Aundria Rud. Will decrease weekly warfarin from 35mg /wk to 32.5mg /wk. RTC on 25-FEB-13 at 1000h. Apprised to call me if there are any subsequent changes in her antibiotics.

## 2011-04-01 NOTE — Progress Notes (Signed)
Addended by: Maura Crandall on: 04/01/2011 10:29 AM   Modules accepted: Orders

## 2011-04-04 ENCOUNTER — Other Ambulatory Visit: Payer: Self-pay | Admitting: Family Medicine

## 2011-04-04 ENCOUNTER — Other Ambulatory Visit: Payer: Self-pay | Admitting: Internal Medicine

## 2011-04-04 MED ORDER — WARFARIN SODIUM 5 MG PO TABS: 5.0000 mg | ORAL_TABLET | Freq: Every day | ORAL | Status: DC

## 2011-04-05 NOTE — Telephone Encounter (Signed)
Refill request

## 2011-04-17 ENCOUNTER — Encounter (INDEPENDENT_AMBULATORY_CARE_PROVIDER_SITE_OTHER): Payer: Medicare Other | Admitting: Ophthalmology

## 2011-04-17 DIAGNOSIS — H353290 Exudative age-related macular degeneration, unspecified eye, stage unspecified: Secondary | ICD-10-CM

## 2011-04-17 DIAGNOSIS — H43819 Vitreous degeneration, unspecified eye: Secondary | ICD-10-CM

## 2011-04-17 DIAGNOSIS — H3530 Unspecified macular degeneration: Secondary | ICD-10-CM

## 2011-04-19 ENCOUNTER — Ambulatory Visit (INDEPENDENT_AMBULATORY_CARE_PROVIDER_SITE_OTHER): Payer: Medicare Other | Admitting: Pharmacist

## 2011-04-19 ENCOUNTER — Ambulatory Visit (INDEPENDENT_AMBULATORY_CARE_PROVIDER_SITE_OTHER): Payer: Medicare Other | Admitting: Internal Medicine

## 2011-04-19 ENCOUNTER — Ambulatory Visit: Payer: Self-pay | Admitting: Pharmacist

## 2011-04-19 ENCOUNTER — Ambulatory Visit: Payer: Medicare Other | Admitting: Internal Medicine

## 2011-04-19 DIAGNOSIS — I4891 Unspecified atrial fibrillation: Secondary | ICD-10-CM

## 2011-04-19 DIAGNOSIS — E119 Type 2 diabetes mellitus without complications: Secondary | ICD-10-CM

## 2011-04-19 DIAGNOSIS — E039 Hypothyroidism, unspecified: Secondary | ICD-10-CM

## 2011-04-19 DIAGNOSIS — Z7901 Long term (current) use of anticoagulants: Secondary | ICD-10-CM

## 2011-04-19 LAB — POCT INR: INR: 4.7

## 2011-04-19 LAB — TSH: TSH: 6.517 u[IU]/mL — ABNORMAL HIGH (ref 0.350–4.500)

## 2011-04-19 LAB — T4, FREE: Free T4: 1.54 ng/dL (ref 0.80–1.80)

## 2011-04-19 LAB — GLUCOSE, CAPILLARY: Glucose-Capillary: 173 mg/dL — ABNORMAL HIGH (ref 70–99)

## 2011-04-19 MED ORDER — TRAMADOL HCL 50 MG PO TABS: 50.0000 mg | ORAL_TABLET | Freq: Four times a day (QID) | ORAL | Status: DC | PRN

## 2011-04-19 MED ORDER — SULFAMETHOXAZOLE-TRIMETHOPRIM 400-80 MG PO TABS: 1.0000 | ORAL_TABLET | Freq: Two times a day (BID) | ORAL | Status: AC

## 2011-04-19 NOTE — Progress Notes (Signed)
Anti-Coagulation Progress Note  [name] is a 78 y.o. female who is currently on an anti-coagulation regimen.    RECENT RESULTS: Recent results are below, the most recent result is correlated with a dose of 32.5 mg. per week: Lab Results  Component Value Date   INR 4.70 04/19/2011   INR 3.06* 03/27/2011   INR 2.69* 03/04/2011    ANTI-COAG DOSE:   Latest dosing instructions   Total Sun Mon Tue Wed Thu Fri Sat   30 5 mg 5 mg 2.5 mg 5 mg 5 mg 2.5 mg 5 mg    (5 mg1) (5 mg1) (5 mg0.5) (5 mg1) (5 mg1) (5 mg0.5) (5 mg1)         ANTICOAG SUMMARY: Anticoagulation Episode Summary              Current INR goal 2.0-3.0 Next INR check 05/06/2011   INR from last check 4.70! (04/19/2011)     Weekly max dose (mg)  Target end date Indefinite   Indications Long-term (current) use of anticoagulants, AF (atrial fibrillation)   INR check location  Preferred lab    Send INR reminders to ANTICOAG IMP   Comments        Provider Role Specialty Phone number   Ulyess Mort, MD  Internal Medicine [phone]        ANTICOAG TODAY: Anticoagulation Summary as of 04/19/2011              INR goal 2.0-3.0     Selected INR 4.70! (04/19/2011) Next INR check 05/06/2011   Weekly max dose (mg)  Target end date Indefinite   Indications Long-term (current) use of anticoagulants, AF (atrial fibrillation)    Anticoagulation Episode Summary              INR check location  Preferred lab    Send INR reminders to ANTICOAG [facility]   Comments        Provider Role Specialty Phone number   Ulyess Mort, MD  Internal Medicine [phone]        PATIENT INSTRUCTIONS: Patient Instructions  Patient instructed to take medications as defined in the Anti-coagulation Track section of this encounter.  Patient instructed to OMIT  today's dose; Saturdays dose and Sunday's dose.  Patient verbalized understanding of these instructions.        FOLLOW-UP Return in 2 weeks (on 05/06/2011) for Follow up  INR.  Hulen Luster, III Pharm.D., CACP

## 2011-04-19 NOTE — Progress Notes (Signed)
80 woman with newly-acknowledged DM (Has had elevated BG and A1c with some fluctuation over a year; We have not started to treat this until last visit on Feb. 1, 2013.  BG that day was > 300.  Today it is 170 about 2 hours after breakfast on glipizide only. She also has folliculitis on her abdomen which I treated with doxycycline.  This is not noticeably improved.  She has sx of a UTI today.   Will rx with bactrim. Only allergy listed is nitrofurantoin. Last TSH was 29 in Jan.  I cannot see that anything was done about this.  She insists she is taking the 125 mcg daily as prescribed.  Will repeat TSH and free T4. Was started on tramadol 50 tid prn by her orthopod.  This is helping.

## 2011-04-19 NOTE — Patient Instructions (Signed)
Patient instructed to take medications as defined in the Anti-coagulation Track section of this encounter.  Patient instructed to OMIT  today's dose; Saturdays dose and Sunday's dose.  Patient verbalized understanding of these instructions.

## 2011-05-03 ENCOUNTER — Other Ambulatory Visit: Payer: Self-pay | Admitting: Internal Medicine

## 2011-05-06 ENCOUNTER — Ambulatory Visit (INDEPENDENT_AMBULATORY_CARE_PROVIDER_SITE_OTHER): Payer: Medicare Other | Admitting: Pharmacist

## 2011-05-06 ENCOUNTER — Other Ambulatory Visit: Payer: Medicare Other

## 2011-05-06 DIAGNOSIS — I4891 Unspecified atrial fibrillation: Secondary | ICD-10-CM

## 2011-05-06 DIAGNOSIS — Z7901 Long term (current) use of anticoagulants: Secondary | ICD-10-CM

## 2011-05-06 DIAGNOSIS — N390 Urinary tract infection, site not specified: Secondary | ICD-10-CM

## 2011-05-06 LAB — POCT INR: INR: 2.9

## 2011-05-06 NOTE — Patient Instructions (Signed)
Patient instructed to take medications as defined in the Anti-coagulation Track section of this encounter.  Patient instructed to take today's dose.  Patient verbalized understanding of these instructions.    

## 2011-05-06 NOTE — Progress Notes (Signed)
Pt here for coumadin clinic visit c/o possible UTI.  Pt with hx recurrent UTIs.  Attempted to have pt void, but she was unable to do so. Using sterile technique, in and out cath was performed on patient.  Sample to be sent to lab for testing.  Pt informed that she will be contacted with the result.. Will also need to make Dr Alexandria Lodge aware of any rxs for antibiotics as it may affect her INR.Kingsley Spittle Cassady3/11/20134:50 PM

## 2011-05-06 NOTE — Progress Notes (Signed)
Anti-Coagulation Progress Note  [name] is a 78 y.o. female who is currently on an anti-coagulation regimen.    RECENT RESULTS: Recent results are below, the most recent result is correlated with a dose of 30 mg. per week: Lab Results  Component Value Date   INR 2.90 05/06/2011   INR 4.70 04/19/2011   INR 3.06* 03/27/2011    ANTI-COAG DOSE:   Latest dosing instructions   Total Sun Mon Tue Wed Thu Fri Sat   30 5 mg 5 mg 2.5 mg 5 mg 5 mg 2.5 mg 5 mg    (5 mg1) (5 mg1) (5 mg0.5) (5 mg1) (5 mg1) (5 mg0.5) (5 mg1)         ANTICOAG SUMMARY: Anticoagulation Episode Summary              Current INR goal 2.0-3.0 Next INR check 05/20/2011   INR from last check 2.90 (05/06/2011)     Weekly max dose (mg)  Target end date Indefinite   Indications Long-term (current) use of anticoagulants, AF (atrial fibrillation)   INR check location  Preferred lab    Send INR reminders to ANTICOAG [facility]   Comments        Provider Role Specialty Phone number   Ulyess Mort, MD  Internal Medicine [phone]        ANTICOAG TODAY: Anticoagulation Summary as of 05/06/2011              INR goal 2.0-3.0     Selected INR 2.90 (05/06/2011) Next INR check 05/20/2011   Weekly max dose (mg)  Target end date Indefinite   Indications Long-term (current) use of anticoagulants, AF (atrial fibrillation)    Anticoagulation Episode Summary              INR check location  Preferred lab    Send INR reminders to ANTICOAG [facility]   Comments        Provider Role Specialty Phone number   Ulyess Mort, MD  Internal Medicine [phone]        PATIENT INSTRUCTIONS: Patient Instructions  Patient instructed to take medications as defined in the Anti-coagulation Track section of this encounter.  Patient instructed to take today's dose.  Patient verbalized understanding of these instructions.        FOLLOW-UP Return in 2 weeks (on 05/20/2011) for Follow up INR.  Hulen Luster, III Pharm.D.,  CACP

## 2011-05-07 LAB — URINALYSIS, ROUTINE W REFLEX MICROSCOPIC
Bilirubin Urine: NEGATIVE
Nitrite: POSITIVE — AB
Protein, ur: NEGATIVE mg/dL
Specific Gravity, Urine: 1.014 (ref 1.005–1.030)
Urobilinogen, UA: 0.2 mg/dL (ref 0.0–1.0)

## 2011-05-07 LAB — URINALYSIS, MICROSCOPIC ONLY
Bacteria, UA: NONE SEEN
Crystals: NONE SEEN

## 2011-05-09 LAB — URINE CULTURE: Colony Count: >=100000

## 2011-05-20 ENCOUNTER — Ambulatory Visit: Payer: Medicare Other

## 2011-05-23 ENCOUNTER — Other Ambulatory Visit: Payer: Self-pay | Admitting: *Deleted

## 2011-05-23 MED ORDER — TRAMADOL HCL 50 MG PO TABS: 50.0000 mg | ORAL_TABLET | Freq: Four times a day (QID) | ORAL | Status: AC | PRN

## 2011-05-31 ENCOUNTER — Other Ambulatory Visit: Payer: Self-pay | Admitting: Internal Medicine

## 2011-06-06 ENCOUNTER — Other Ambulatory Visit: Payer: Self-pay | Admitting: Internal Medicine

## 2011-06-06 MED ORDER — ATORVASTATIN CALCIUM 40 MG PO TABS: 40.0000 mg | ORAL_TABLET | Freq: Every day | ORAL | Status: DC

## 2011-06-06 MED ORDER — LOSARTAN POTASSIUM 50 MG PO TABS: 50.0000 mg | ORAL_TABLET | Freq: Two times a day (BID) | ORAL | Status: DC

## 2011-06-06 NOTE — Telephone Encounter (Signed)
Per EPIC "recon meds" she was getting 31 Cozaar monthly which would be QD rather than the BID in EPIC. But seemed to be picking it up every 2 weeks. Called pt who stated she does take it BID. Didn't know why she only got 30 pills at a time. Increased to 60 pills each refill.   Pt needs an appt per Dr Aundria Rud recs last appt earlier this year.

## 2011-06-17 ENCOUNTER — Ambulatory Visit (INDEPENDENT_AMBULATORY_CARE_PROVIDER_SITE_OTHER): Payer: Medicare Other | Admitting: Pharmacist

## 2011-06-17 ENCOUNTER — Encounter: Payer: Medicare Other | Admitting: Internal Medicine

## 2011-06-17 DIAGNOSIS — I4891 Unspecified atrial fibrillation: Secondary | ICD-10-CM

## 2011-06-17 DIAGNOSIS — Z7901 Long term (current) use of anticoagulants: Secondary | ICD-10-CM

## 2011-06-17 NOTE — Progress Notes (Signed)
Anti-Coagulation Progress Note  [name] is a 78 y.o. female who is currently on an anti-coagulation regimen.    RECENT RESULTS: Recent results are below, the most recent result is correlated with a dose of 30 mg. per week: Lab Results  Component Value Date   INR 1.10 06/17/2011   INR 2.90 05/06/2011   INR 4.70 04/19/2011    ANTI-COAG DOSE:   Latest dosing instructions   Total Sun Mon Tue Wed Thu Fri Sat   35 5 mg 5 mg 5 mg 5 mg 5 mg 5 mg 5 mg    (5 mg1) (5 mg1) (5 mg1) (5 mg1) (5 mg1) (5 mg1) (5 mg1)         ANTICOAG SUMMARY: Anticoagulation Episode Summary              Current INR goal 2.0-3.0 Next INR check 07/08/2011   INR from last check 1.10! (06/17/2011)     Weekly max dose (mg)  Target end date Indefinite   Indications Long-term (current) use of anticoagulants, AF (atrial fibrillation)   INR check location  Preferred lab    Send INR reminders to ANTICOAG [facility]   Comments        Provider Role Specialty Phone number   Ulyess Mort, MD  Internal Medicine [phone]        ANTICOAG TODAY: Anticoagulation Summary as of 06/17/2011              INR goal 2.0-3.0     Selected INR 1.10! (06/17/2011) Next INR check 07/08/2011   Weekly max dose (mg)  Target end date Indefinite   Indications Long-term (current) use of anticoagulants, AF (atrial fibrillation)    Anticoagulation Episode Summary              INR check location  Preferred lab    Send INR reminders to ANTICOAG [facility]   Comments        Provider Role Specialty Phone number   Ulyess Mort, MD  Internal Medicine [phone]        PATIENT INSTRUCTIONS: Patient Instructions  Patient instructed to take medications as defined in the Anti-coagulation Track section of this encounter.  Patient instructed to take today's dose.  Patient verbalized understanding of these instructions.        FOLLOW-UP Return in 3 weeks (on 07/08/2011) for Follow up INR.  Hulen Luster, III Pharm.D., CACP

## 2011-06-17 NOTE — Patient Instructions (Signed)
Patient instructed to take medications as defined in the Anti-coagulation Track section of this encounter.  Patient instructed to take today's dose.  Patient verbalized understanding of these instructions.    

## 2011-06-18 ENCOUNTER — Encounter: Payer: Self-pay | Admitting: Internal Medicine

## 2011-06-18 ENCOUNTER — Ambulatory Visit (INDEPENDENT_AMBULATORY_CARE_PROVIDER_SITE_OTHER): Payer: Medicare Other | Admitting: Internal Medicine

## 2011-06-18 VITALS — BP 123/62 | HR 61 | Temp 97.4°F | Resp 20 | Ht 64.0 in | Wt 287.3 lb

## 2011-06-18 DIAGNOSIS — E119 Type 2 diabetes mellitus without complications: Secondary | ICD-10-CM

## 2011-06-18 DIAGNOSIS — I10 Essential (primary) hypertension: Secondary | ICD-10-CM

## 2011-06-18 DIAGNOSIS — N390 Urinary tract infection, site not specified: Secondary | ICD-10-CM

## 2011-06-18 LAB — POCT GLYCOSYLATED HEMOGLOBIN (HGB A1C): Hemoglobin A1C: 7.6

## 2011-06-18 LAB — BASIC METABOLIC PANEL WITH GFR
BUN: 37 mg/dL — ABNORMAL HIGH (ref 6–23)
CO2: 30 mEq/L (ref 19–32)
Chloride: 97 mEq/L (ref 96–112)
Creat: 1.57 mg/dL — ABNORMAL HIGH (ref 0.50–1.10)
Glucose, Bld: 167 mg/dL — ABNORMAL HIGH (ref 70–99)

## 2011-06-18 LAB — GLUCOSE, CAPILLARY: Glucose-Capillary: 179 mg/dL — ABNORMAL HIGH (ref 70–99)

## 2011-06-18 NOTE — Progress Notes (Addendum)
Patient ID: [name], female   DOB: Aug 21, 1932, 78 y.o.   MRN: [id number]  Subjective:   Patient ID: [name] female   DOB: 04-14-1932 78 y.o.   MRN: [id number]  HPI: [name] is a 78 y.o. with PMH outlined as below who presents to the clinic for urinary tract infection. Patients states that she has had chronic urinary tract infection in the past and was treated with many ABX as well.  She reports urinary burning sensation for a while and is not clear whether she has urinary frequency or urgency or not since she has intermittent urinary incontinence. She had office visit with Dr. Alexandria [name] at her coumadin clinic yesterday, but she was unable to give an urine sample. She is here today for evaluation of her symptoms.  Of note, her most recent Urine culture showed E coli on 05/06/11.  No headache, fever, or sore throat. No shortness of breath or dyspnea on exertion. No chest pain, chest pressure or palpitation No nausea, vomiting, or abdominal pain. No melena, diarrhea or incontinence. No muscle weakness.                   Denies depression. No appetite or weight changes.   Past Medical History  Diagnosis Date  . CAD (coronary artery disease)     s/p remote MI (20+ yrs ago)  . Abdominal pain     recent admission, likely secondary to presbyesophagus and gastric dysmotility  . CVD (cardiovascular disease)   . Diastolic dysfunction     with dilated cardiomyopathy, last EF 55%(09/2006)  . Respiratory failure, chronic     mixed etiology with bronchospastic component  . Urinary incontinence     recurrent uti's/resistance to cipro, bactrim  . Gout   . Hypothyroidism   . Hyperlipidemia   . Depression   . Morbidly obese     s/p gastric bypass  . Gastroesophageal reflux disease   . Ventral hernia     repair in April 2008, complicated by MRSA abdominal wall cellulitis  . DVT (deep venous thrombosis)   . Pulmonary embolism     1998, s/p [place] filter  . Diabetes mellitus    . Hypertension   . Pyelonephritis   . History of recurrent UTIs   . AF (atrial fibrillation)     on chronic warfarin  . Osteoarthritis   . Myocardial infarction   . Stroke     denies residual  . Blood transfusion   . Anemia    Current Outpatient Prescriptions  Medication Sig Dispense Refill  . acetaminophen (TYLENOL) 500 MG tablet Take 1,000 mg by mouth 2 (two) times daily as needed. For pain.      Marland Kitchen albuterol (PROVENTIL HFA) 108 (90 BASE) MCG/ACT inhaler Inhale 2 puffs into the lungs every 6 (six) hours as needed. For wheezing.      Marland Kitchen amLODipine (NORVASC) 5 MG tablet Take 1 tablet (5 mg total) by mouth daily.  30 tablet  11  . atorvastatin (LIPITOR) 40 MG tablet Take 1 tablet (40 mg total) by mouth daily.  30 tablet  5  . COLCRYS 0.6 MG tablet Take 1 tablet by mouth Daily.      Marland Kitchen FLUoxetine (PROZAC) 20 MG capsule Take 20 mg by mouth daily.       . Fluticasone-Salmeterol (ADVAIR) 250-50 MCG/DOSE AEPB Inhale 1 puff into the lungs every 12 (twelve) hours.        . furosemide (LASIX) 40 MG tablet TAKE TWO  TABLETS BY MOUTH EVERY DAY  62 tablet  6  . glipiZIDE (GLUCOTROL) 10 MG tablet Take 1 tablet (10 mg total) by mouth 2 (two) times daily before a meal.  31 tablet  11  . levothyroxine (SYNTHROID, LEVOTHROID) 125 MCG tablet Take 125 mcg by mouth daily.        Marland Kitchen losartan (COZAAR) 50 MG tablet Take 1 tablet (50 mg total) by mouth 2 (two) times daily.  60 tablet  5  . montelukast (SINGULAIR) 10 MG tablet Take 10 mg by mouth at bedtime.        Marland Kitchen omeprazole (PRILOSEC) 20 MG capsule TAKE TWO CAPSULES BY MOUTH TWICE DAILY  60 capsule  6  . ondansetron (ZOFRAN-ODT) 4 MG disintegrating tablet Take 4 mg by mouth every 12 (twelve) hours as needed. For nausea.      . polyethylene glycol (MIRALAX / GLYCOLAX) packet Take 17 g by mouth daily as needed. For constipation.       Marland Kitchen spironolactone (ALDACTONE) 25 MG tablet TAKE ONE TABLET BY MOUTH EVERY DAY  31 tablet  6  . tiotropium (SPIRIVA) 18 MCG  inhalation capsule Place 18 mcg into inhaler and inhale daily.        Marland Kitchen tolterodine (DETROL LA) 2 MG 24 hr capsule Take 2 mg by mouth daily.        Marland Kitchen warfarin (COUMADIN) 5 MG tablet Take 1 tablet (5 mg total) by mouth daily.  31 tablet  6  . DISCONTD: furosemide (LASIX) 40 MG tablet Take 80 mg by mouth daily.        Marland Kitchen DISCONTD: FLUoxetine (PROZAC) 20 MG capsule TAKE ONE CAPSULE BY MOUTH TWICE DAILY  60 capsule  3  . DISCONTD: omeprazole (PRILOSEC) 40 MG capsule Take 40 mg by mouth 2 (two) times daily.        Marland Kitchen DISCONTD: spironolactone (ALDACTONE) 25 MG tablet Take 25 mg by mouth daily.         Family History  Problem Relation Age of Onset  . Colon cancer Neg Hx    History   Social History  . Marital Status: Widowed    Spouse Name: N/A    Number of Children: N/A  . Years of Education: N/A   Social History Main Topics  . Smoking status: Former Smoker -- 1.0 packs/day for 1.5 years    Types: Cigarettes  . Smokeless tobacco: Never Used  . Alcohol Use: No  . Drug Use: No  . Sexually Active: No   Other Topics Concern  . None   Social History Narrative   She lives w/her son. Her son offers help and she has an aid who occasionally cooks for her and takes care of some other tasks around the pt's house.Has medicare. Has son in [place] who is POAAmbivalent about DNR issues and does not want a STOP sign posted in home   Review of Systems: See HPI  Objective:  Physical Exam: Filed Vitals:   06/18/11 1443  BP: 123/62  Pulse: 61  Temp: 97.4 F (36.3 C)  TempSrc: Oral  Resp: 20  Height: 5\' 4"  (1.626 m)  Weight: 287 lb 4.8 oz (130.318 kg)  SpO2: 93%   General: alert, well-developed, and cooperative to examination.  Head: normocephalic and atraumatic.  Eyes: vision grossly intact, pupils equal, pupils round, pupils reactive to light, no injection and anicteric.  Mouth: pharynx pink and moist, no erythema, and no exudates.  Neck: supple, full ROM, no thyromegaly, no JVD, and no carotid  bruits.  Lungs: normal respiratory effort, no accessory muscle use, normal breath sounds, no crackles, and no wheezes. Heart: normal rate, regular rhythm, no murmur, no gallop, and no rub.  Abdomen: soft, non-tender, normal bowel sounds, no distention, no guarding, no rebound tenderness, no hepatomegaly, and no splenomegaly.  Msk: no joint swelling, no joint warmth, and no redness over joints.  Pulses: 2+ DP/PT pulses bilaterally Extremities: No cyanosis, clubbing, edema Neurologic: alert & oriented X3, cranial nerves II-XII intact, strength normal in all extremities, sensation intact to light touch, and gait normal.  Skin: turgor normal and no rashes.  Psych: Oriented X3, memory intact for recent and remote, normally interactive, good eye contact, not anxious appearing, and not depressed appearing.   Assessment & Plan:   Recurrent UTI UA positive and Urine culture showed E coli - After discussing with Dr. Maurice March, will prescribe her with Fosfomycin 3 g po x 1 dose. - patient is called and instructed to pick up the medication at [facility].

## 2011-06-18 NOTE — Assessment & Plan Note (Signed)
The clinical manifestation is consistent with UTI.  - patient is unable to urinate, will obtain I+O urine sample>>>sent for UA and U Cx. - will check her BMP

## 2011-06-18 NOTE — Patient Instructions (Signed)
1. Follow up with me in one month 2. Will call you for urine culture

## 2011-06-18 NOTE — Assessment & Plan Note (Signed)
Hb A1C 7.6.  Continue current medications.

## 2011-06-18 NOTE — Assessment & Plan Note (Signed)
Stable, well controlled. - continue current regimen

## 2011-06-19 ENCOUNTER — Encounter (INDEPENDENT_AMBULATORY_CARE_PROVIDER_SITE_OTHER): Payer: Medicare Other | Admitting: Ophthalmology

## 2011-06-19 LAB — URINALYSIS, ROUTINE W REFLEX MICROSCOPIC
Hgb urine dipstick: NEGATIVE
Ketones, ur: NEGATIVE mg/dL
Nitrite: POSITIVE — AB
Urobilinogen, UA: 0.2 mg/dL (ref 0.0–1.0)

## 2011-06-19 LAB — URINALYSIS, MICROSCOPIC ONLY

## 2011-06-20 ENCOUNTER — Encounter: Payer: Medicare Other | Admitting: Internal Medicine

## 2011-06-20 ENCOUNTER — Encounter (INDEPENDENT_AMBULATORY_CARE_PROVIDER_SITE_OTHER): Payer: Medicare Other | Admitting: Ophthalmology

## 2011-06-20 DIAGNOSIS — H43819 Vitreous degeneration, unspecified eye: Secondary | ICD-10-CM

## 2011-06-20 DIAGNOSIS — H3530 Unspecified macular degeneration: Secondary | ICD-10-CM

## 2011-06-20 DIAGNOSIS — H353290 Exudative age-related macular degeneration, unspecified eye, stage unspecified: Secondary | ICD-10-CM

## 2011-06-23 ENCOUNTER — Other Ambulatory Visit: Payer: Self-pay | Admitting: Internal Medicine

## 2011-06-24 MED ORDER — FOSFOMYCIN TROMETHAMINE 3 G PO PACK: 3.0000 g | PACK | Freq: Once | ORAL | Status: DC

## 2011-06-24 NOTE — Progress Notes (Signed)
Addended byDierdre Searles, Nichalos Brenton on: 06/24/2011 03:27 PM   Modules accepted: Orders

## 2011-07-08 ENCOUNTER — Ambulatory Visit (INDEPENDENT_AMBULATORY_CARE_PROVIDER_SITE_OTHER): Payer: Medicare Other | Admitting: Pharmacist

## 2011-07-08 DIAGNOSIS — Z7901 Long term (current) use of anticoagulants: Secondary | ICD-10-CM

## 2011-07-08 DIAGNOSIS — I4891 Unspecified atrial fibrillation: Secondary | ICD-10-CM

## 2011-07-08 LAB — POCT INR: INR: 2.6

## 2011-07-08 NOTE — Patient Instructions (Signed)
Patient instructed to take medications as defined in the Anti-coagulation Track section of this encounter.  Patient instructed to take today's dose.  Patient verbalized understanding of these instructions.    

## 2011-07-08 NOTE — Progress Notes (Signed)
Anti-Coagulation Progress Note  [name] is a 78 y.o. female who is currently on an anti-coagulation regimen.    RECENT RESULTS: Recent results are below, the most recent result is correlated with a dose of 35 mg. per week: Lab Results  Component Value Date   INR 2.60 07/08/2011   INR 1.10 06/17/2011   INR 2.90 05/06/2011    ANTI-COAG DOSE:   Latest dosing instructions   Total Sun Mon Tue Wed Thu Fri Sat   35 5 mg 5 mg 5 mg 5 mg 5 mg 5 mg 5 mg    (5 mg1) (5 mg1) (5 mg1) (5 mg1) (5 mg1) (5 mg1) (5 mg1)         ANTICOAG SUMMARY: Anticoagulation Episode Summary              Current INR goal 2.0-3.0 Next INR check 08/06/2011   INR from last check 2.60 (07/08/2011)     Weekly max dose (mg)  Target end date Indefinite   Indications Long-term (current) use of anticoagulants, AF (atrial fibrillation)   INR check location  Preferred lab    Send INR reminders to ANTICOAG IMP   Comments        Provider Role Specialty Phone number   Ulyess Mort, MD  Internal Medicine [phone]        ANTICOAG TODAY: Anticoagulation Summary as of 07/08/2011              INR goal 2.0-3.0     Selected INR 2.60 (07/08/2011) Next INR check 08/06/2011   Weekly max dose (mg)  Target end date Indefinite   Indications Long-term (current) use of anticoagulants, AF (atrial fibrillation)    Anticoagulation Episode Summary              INR check location  Preferred lab    Send INR reminders to ANTICOAG IMP   Comments        Provider Role Specialty Phone number   Ulyess Mort, MD  Internal Medicine [phone]        PATIENT INSTRUCTIONS: Patient Instructions  Patient instructed to take medications as defined in the Anti-coagulation Track section of this encounter.  Patient instructed to take today's dose.  Patient verbalized understanding of these instructions.        FOLLOW-UP Return in 4 weeks (on 08/06/2011) for Follow up INR.  Hulen Luster, III Pharm.D., CACP

## 2011-08-02 ENCOUNTER — Encounter: Payer: Medicare Other | Admitting: Internal Medicine

## 2011-08-02 ENCOUNTER — Encounter: Payer: Self-pay | Admitting: Internal Medicine

## 2011-08-02 ENCOUNTER — Ambulatory Visit (INDEPENDENT_AMBULATORY_CARE_PROVIDER_SITE_OTHER): Payer: Medicare Other | Admitting: Internal Medicine

## 2011-08-02 ENCOUNTER — Ambulatory Visit: Payer: Medicare Other

## 2011-08-02 VITALS — BP 127/66 | HR 62 | Temp 97.0°F | Wt 288.8 lb

## 2011-08-02 DIAGNOSIS — E119 Type 2 diabetes mellitus without complications: Secondary | ICD-10-CM

## 2011-08-02 LAB — LIPID PANEL: Total CHOL/HDL Ratio: 3.8 Ratio

## 2011-08-02 LAB — GLUCOSE, CAPILLARY: Glucose-Capillary: 173 mg/dL — ABNORMAL HIGH (ref 70–99)

## 2011-08-02 NOTE — Assessment & Plan Note (Signed)
On glipizide.  A1c is down to 7.6.  Creat of 1.57 precludes metformin. BP is good on losartan and lasix. Lipids last checked in 2012 with LDL = 94.  she is on atorvastatin and no more can be done about this.

## 2011-08-02 NOTE — Assessment & Plan Note (Signed)
Lungs clear.  No leg edema.

## 2011-08-02 NOTE — Progress Notes (Signed)
63 woman with morbid obesity and sedentary life.  Many related problems.  Nothing new today.  Reviewed meds and problem list.  She usually has UTI sx but not today.  will try to get overdue urine albumin, esp as creat is slightly higher than before.

## 2011-08-02 NOTE — Assessment & Plan Note (Signed)
No CV sx other than general debility and DOE.  Warfarin dose to be adjusted today by Dr. Alexandria Lodge.

## 2011-08-06 ENCOUNTER — Ambulatory Visit: Payer: Medicare Other

## 2011-08-22 ENCOUNTER — Encounter (INDEPENDENT_AMBULATORY_CARE_PROVIDER_SITE_OTHER): Payer: Medicare Other | Admitting: Ophthalmology

## 2011-08-22 ENCOUNTER — Other Ambulatory Visit: Payer: Self-pay | Admitting: Internal Medicine

## 2011-08-22 DIAGNOSIS — H2700 Aphakia, unspecified eye: Secondary | ICD-10-CM

## 2011-08-22 DIAGNOSIS — H3530 Unspecified macular degeneration: Secondary | ICD-10-CM

## 2011-08-22 DIAGNOSIS — H353290 Exudative age-related macular degeneration, unspecified eye, stage unspecified: Secondary | ICD-10-CM

## 2011-08-22 DIAGNOSIS — H35439 Paving stone degeneration of retina, unspecified eye: Secondary | ICD-10-CM

## 2011-08-22 DIAGNOSIS — H43819 Vitreous degeneration, unspecified eye: Secondary | ICD-10-CM

## 2011-08-23 ENCOUNTER — Encounter: Payer: Medicare Other | Admitting: Internal Medicine

## 2011-08-26 ENCOUNTER — Ambulatory Visit: Payer: Medicare Other

## 2011-09-04 ENCOUNTER — Other Ambulatory Visit: Payer: Self-pay | Admitting: Internal Medicine

## 2011-10-03 ENCOUNTER — Other Ambulatory Visit: Payer: Self-pay | Admitting: Internal Medicine

## 2011-11-06 ENCOUNTER — Other Ambulatory Visit: Payer: Self-pay | Admitting: Internal Medicine

## 2011-11-07 ENCOUNTER — Encounter (INDEPENDENT_AMBULATORY_CARE_PROVIDER_SITE_OTHER): Payer: Medicare Other | Admitting: Ophthalmology

## 2011-11-07 DIAGNOSIS — H3530 Unspecified macular degeneration: Secondary | ICD-10-CM

## 2011-11-07 DIAGNOSIS — H353290 Exudative age-related macular degeneration, unspecified eye, stage unspecified: Secondary | ICD-10-CM

## 2011-11-07 DIAGNOSIS — H43819 Vitreous degeneration, unspecified eye: Secondary | ICD-10-CM

## 2011-11-20 ENCOUNTER — Other Ambulatory Visit: Payer: Self-pay | Admitting: *Deleted

## 2011-11-20 MED ORDER — COLCHICINE 0.6 MG PO TABS: 0.6000 mg | ORAL_TABLET | Freq: Every day | ORAL | Status: DC

## 2011-11-20 NOTE — Telephone Encounter (Signed)
Has appt Oct Dr Everardo Beals

## 2011-11-28 ENCOUNTER — Ambulatory Visit (INDEPENDENT_AMBULATORY_CARE_PROVIDER_SITE_OTHER): Payer: Medicare Other | Admitting: Internal Medicine

## 2011-11-28 ENCOUNTER — Encounter: Payer: Self-pay | Admitting: Internal Medicine

## 2011-11-28 ENCOUNTER — Other Ambulatory Visit: Payer: Self-pay | Admitting: Internal Medicine

## 2011-11-28 VITALS — BP 124/78 | HR 65 | Temp 97.4°F | Ht 62.0 in

## 2011-11-28 DIAGNOSIS — Z299 Encounter for prophylactic measures, unspecified: Secondary | ICD-10-CM

## 2011-11-28 DIAGNOSIS — M1990 Unspecified osteoarthritis, unspecified site: Secondary | ICD-10-CM

## 2011-11-28 DIAGNOSIS — G909 Disorder of the autonomic nervous system, unspecified: Secondary | ICD-10-CM

## 2011-11-28 DIAGNOSIS — E119 Type 2 diabetes mellitus without complications: Secondary | ICD-10-CM

## 2011-11-28 DIAGNOSIS — I10 Essential (primary) hypertension: Secondary | ICD-10-CM

## 2011-11-28 DIAGNOSIS — E039 Hypothyroidism, unspecified: Secondary | ICD-10-CM

## 2011-11-28 DIAGNOSIS — Z23 Encounter for immunization: Secondary | ICD-10-CM

## 2011-11-28 DIAGNOSIS — E1149 Type 2 diabetes mellitus with other diabetic neurological complication: Secondary | ICD-10-CM

## 2011-11-28 DIAGNOSIS — E1142 Type 2 diabetes mellitus with diabetic polyneuropathy: Secondary | ICD-10-CM

## 2011-11-28 DIAGNOSIS — I4891 Unspecified atrial fibrillation: Secondary | ICD-10-CM

## 2011-11-28 LAB — CBC
Hemoglobin: 14.1 g/dL (ref 12.0–15.0)
MCH: 31 pg (ref 26.0–34.0)
Platelets: 262 10*3/uL (ref 150–400)
RBC: 4.55 MIL/uL (ref 3.87–5.11)
WBC: 8.6 10*3/uL (ref 4.0–10.5)

## 2011-11-28 LAB — POCT INR: INR: 6.2

## 2011-11-28 LAB — GLUCOSE, CAPILLARY: Glucose-Capillary: 157 mg/dL — ABNORMAL HIGH (ref 70–99)

## 2011-11-28 MED ORDER — ZOSTER VACCINE LIVE 19400 UNT/0.65ML ~~LOC~~ SOLR: 0.6500 mL | Freq: Once | SUBCUTANEOUS | Status: DC

## 2011-11-28 MED ORDER — GABAPENTIN 100 MG PO CAPS: 100.0000 mg | ORAL_CAPSULE | Freq: Two times a day (BID) | ORAL | Status: DC

## 2011-11-28 NOTE — Assessment & Plan Note (Signed)
Lab Results  Component Value Date   NA 138 06/18/2011   K 5.1 06/18/2011   CL 97 06/18/2011   CO2 30 06/18/2011   BUN 37* 06/18/2011   CREATININE 1.57* 06/18/2011   CREATININE 1.37* 03/01/2011    BP Readings from Last 3 Encounters:  11/28/11 124/78  08/02/11 127/66  06/18/11 123/62    Assessment: Hypertension control:  controlled  Progress toward goals:  at goal Barriers to meeting goals:  no barriers identified  Plan: Hypertension treatment:  continue current medications - unclear why patient not on BB given h/o CAD

## 2011-11-28 NOTE — Assessment & Plan Note (Signed)
Start gabapentin 100mg  daily, then increase to twice daily after 3 days if needed.

## 2011-11-28 NOTE — Assessment & Plan Note (Addendum)
Lab Results  Component Value Date   HGBA1C 7.5 11/28/2011   HGBA1C 7.0 01/01/2010   CREATININE 1.57* 06/18/2011   CREATININE 1.37* 03/01/2011   MICROALBUR 1.46 05/20/2009   MICRALBCREAT 48.0* 05/20/2009   CHOL 136 08/02/2011   HDL 36* 08/02/2011   TRIG 192* 08/02/2011    Last eye exam and foot exam:  Diabetic foot exam done today, patient scheduled for eye exam  Assessment: Diabetes control: controlled Progress toward goals: at goal Barriers to meeting goals: no barriers identified  Plan: Diabetes treatment: continue current medications - patient unclear if taking glipizide daily or BID  Also, patient unable to void in clinic today - she was given supplies to bring urine sample prior to next visit so we may check microalb/cr Refer to: none Instruction/counseling given: reminded to bring blood glucose meter & log to each visit, reminded to bring medications to each visit and discussed foot care

## 2011-11-28 NOTE — Assessment & Plan Note (Signed)
Monitor Ca, Phos, iPTH & GFR today.

## 2011-11-28 NOTE — Patient Instructions (Addendum)
-  Please do not take your coumadin for the next 2 days (Thursday & Friday).  Restart coumadin on Saturday.  Return to clinic on Monday preferably, otherwise return on Tuesday to check your INR.    -You may return to follow up with me in 3 months.  -You may start Neurontin (Gabapentin) for the tingling sensation in your feet.  You will start 100mg  before bed for 3 days, then you may increase to 100mg  twice daily if needed.  -I have given you a prescription for the shingles vaccine - you can take it to your pharmacy to get the vaccine  -Please bring a urine sample with you at your next visit (preferably when you come to have your INR checked next week).  You can collect this sample just before coming in.  Please be sure to bring all of your medications with you to every visit.  Should you have any new or worsening symptoms, please be sure to call the clinic at [phone].

## 2011-11-28 NOTE — Progress Notes (Signed)
Subjective:   Patient ID: [name] female   DOB: March 19, 1932 78 y.o.   MRN: [id number]  HPI: [name] is a 78 y.o. woman with morbid obesity and several related problems presents today for follow up.  She was last seen by PCP on 08/02/11.    Doesn't check CBG at night, eats mostly vegetables/meat (meals on wheels).  Occasionally feels like low CBG but knows to have something to eat, usually jello Breakfast - yogurt, oatmeal Lung: meals on wheels Supper: meatballs, vegetables  On 2L O2, breathing well, no increased SOB; chronic cough without change No increase in ankle swelling  Main complaint is pins/needles sensation in feet - interested in neurontin  Notes bed bugs at home, can't get rid of them - tried washing clothes, changing mattress, got exterminator   No urinary compliants.  No hematuria, blood in stools (except occasional hemorrhoid), no hemoptysis or hemetemesis.    Past Medical History  Diagnosis Date  . CAD (coronary artery disease)     s/p remote MI (20+ yrs ago)  . Abdominal pain     recent admission, likely secondary to presbyesophagus and gastric dysmotility  . CVD (cardiovascular disease)   . Diastolic dysfunction     with dilated cardiomyopathy, last EF 55%(09/2006)  . Respiratory failure, chronic     mixed etiology with bronchospastic component  . Urinary incontinence     recurrent uti's/resistance to cipro, bactrim  . Gout   . Hypothyroidism   . Hyperlipidemia   . Depression   . Morbidly obese     s/p gastric bypass  . Gastroesophageal reflux disease   . Ventral hernia     repair in April 2008, complicated by MRSA abdominal wall cellulitis  . DVT (deep venous thrombosis)   . Pulmonary embolism     1998, s/p [place] filter  . Diabetes mellitus   . Hypertension   . Pyelonephritis   . History of recurrent UTIs   . AF (atrial fibrillation)     on chronic warfarin  . Osteoarthritis   . Myocardial infarction   . Stroke    denies residual  . Blood transfusion   . Anemia    Current Outpatient Prescriptions  Medication Sig Dispense Refill  . acetaminophen (TYLENOL) 500 MG tablet Take 1,000 mg by mouth 2 (two) times daily as needed. For pain.      Marland Kitchen albuterol (PROVENTIL HFA) 108 (90 BASE) MCG/ACT inhaler Inhale 2 puffs into the lungs every 6 (six) hours as needed. For wheezing.      Marland Kitchen amLODipine (NORVASC) 5 MG tablet Take 1 tablet (5 mg total) by mouth daily.  30 tablet  11  . atorvastatin (LIPITOR) 40 MG tablet Take 1 tablet (40 mg total) by mouth daily.  30 tablet  5  . colchicine (COLCRYS) 0.6 MG tablet Take 1 tablet (0.6 mg total) by mouth daily.  30 tablet  3  . FLUoxetine (PROZAC) 20 MG capsule TAKE ONE CAPSULE BY MOUTH TWICE DAILY  60 capsule  11  . Fluticasone-Salmeterol (ADVAIR) 250-50 MCG/DOSE AEPB Inhale 1 puff into the lungs every 12 (twelve) hours.        . fosfomycin (MONUROL) 3 G PACK Take 3 g by mouth once.  3 g  0  . furosemide (LASIX) 40 MG tablet TAKE TWO TABLETS BY MOUTH EVERY DAY  60 tablet  6  . glipiZIDE (GLUCOTROL) 10 MG tablet TAKE ONE TABLET BY MOUTH TWICE DAILY BEFORE A MEAL  60 tablet  6  .  levothyroxine (SYNTHROID, LEVOTHROID) 125 MCG tablet Take 125 mcg by mouth daily.        Marland Kitchen losartan (COZAAR) 50 MG tablet Take 1 tablet (50 mg total) by mouth 2 (two) times daily.  60 tablet  5  . montelukast (SINGULAIR) 10 MG tablet TAKE ONE TABLET BY MOUTH EVERY DAY  30 tablet  6  . omeprazole (PRILOSEC) 20 MG capsule TAKE TWO CAPSULES BY MOUTH TWICE DAILY  120 capsule  6  . ondansetron (ZOFRAN-ODT) 4 MG disintegrating tablet Take 4 mg by mouth every 12 (twelve) hours as needed. For nausea.      . polyethylene glycol powder (GLYCOLAX/MIRALAX) powder USE AS DIRECTED  527 g  11  . SPIRIVA HANDIHALER 18 MCG inhalation capsule PLACE ONE CAPSULE INTO INHALER AND INHALE DAILY  30 each  6  . spironolactone (ALDACTONE) 25 MG tablet TAKE ONE TABLET BY MOUTH EVERY DAY  31 tablet  6  . tolterodine (DETROL LA)  2 MG 24 hr capsule Take 2 mg by mouth daily.        Marland Kitchen warfarin (COUMADIN) 5 MG tablet Take 1 tablet (5 mg total) by mouth daily.  31 tablet  6   Family History  Problem Relation Age of Onset  . Colon cancer Neg Hx    History   Social History  . Marital Status: Widowed    Spouse Name: N/A    Number of Children: N/A  . Years of Education: N/A   Social History Main Topics  . Smoking status: Former Smoker -- 1.0 packs/day for 1.5 years    Types: Cigarettes  . Smokeless tobacco: Never Used  . Alcohol Use: No  . Drug Use: No  . Sexually Active: No   Other Topics Concern  . Not on file   Social History Narrative   She lives w/her son. Her son offers help and she has an aid who occasionally cooks for her and takes care of some other tasks around the pt's house.Has medicare. Has son in [place] who is POAAmbivalent about DNR issues and does not want a STOP sign posted in home   Review of Systems: Constitutional: Denies fever, chills, diaphoresis, appetite change HEENT: Denies photophobia, eye pain, redness, hearing loss, ear pain, congestion, sore throat, rhinorrhea, sneezing, mouth sores, trouble swallowing, neck pain, neck stiffness and tinnitus.   Respiratory: Denies chest tightness  Cardiovascular: Denies chest pain, palpitations and leg swelling.  Gastrointestinal: Denies nausea, vomiting, abdominal pain, diarrhea, constipation, blood in stool and abdominal distention.  Genitourinary: Denies dysuria, urgency, frequency, hematuria, flank pain and difficulty urinating.  Musculoskeletal: uses tramadol for shoulder pain Skin: reports bug bites from bed bugs  Neurological: Denies dizziness, seizures, syncope, weakness, light-headedness, numbness and headaches.  Psychiatric/Behavioral: Denies suicidal ideation, mood changes, confusion, nervousness, sleep disturbance and agitation  Objective:  Physical Exam: Filed Vitals:   11/28/11 1327 11/28/11 1328  BP:  124/78  Pulse: 65   Temp:  97.4 F (36.3 C)   TempSrc: Oral   Height: 5\' 2"  (1.575 m)   SpO2: 95%    Constitutional: Vital signs reviewed.  Patient is an obese woman in no acute distress and cooperative with exam. Mouth: no erythema or exudates, MMM, missing dentition Eyes: PERRL, EOMI, conjunctivae normal, No scleral icterus.  Neck: Supple, Trachea midline normal ROM, No JVD, mass, thyromegaly, or carotid bruit present.  Cardiovascular: RRR, S1 normal, S2 normal, no MRG, pulses symmetric and intact bilaterally Pulmonary/Chest: CTAB, no wheezes, rales, or rhonchi Abdominal: Soft. Non-tender, non-distended,  bowel sounds are normal, no masses, organomegaly, or guarding present.  Neurological: A&O x3, Strength is normal and symmetric bilaterally, cranial nerve II-XII are grossly intact, no focal motor deficit, decreased sensation to b/l feet (formal foot exam done today) - patient in wheelchair, did not assess gait Psychiatric: Normal mood and affect. speech and behavior is normal. Judgment and thought content normal. Cognition and memory are normal.   Assessment & Plan:  Case and care discussed with Dr. Kem Kays.  Please see problem oriented charting for further details. Patient to return in 4-5 days for INR check, but 3 months for routine follow up.

## 2011-11-28 NOTE — Assessment & Plan Note (Signed)
Rate controlled without BB.  Will have to investigate why patient not on BB.  INR is supratherapuetic (6.2) - Patient to hold coumadin x 2days, then restart and come on Tuesday (recommended Monday 12/02/11 but patient without transportation) for INR check.  Unclear why INR elevated - no new meds/antibiotics.  No change in diet.  No evidence of gross bleeding.  Check CBC today.

## 2011-11-28 NOTE — Assessment & Plan Note (Addendum)
Compliant with synthroid.  Last TSH was elevated with noral free t4.  Recheck TSH today.  ADDENDUM:  TSH = 11.76 on 11/28/11;  On 04/19/11, TSH was 6.517, but free T4 was wnl, so will add on free T4 before making medication changes.

## 2011-11-29 LAB — COMPREHENSIVE METABOLIC PANEL
ALT: 30 U/L (ref 0–35)
Albumin: 3.7 g/dL (ref 3.5–5.2)
CO2: 30 mEq/L (ref 19–32)
Calcium: 10.2 mg/dL (ref 8.4–10.5)
Chloride: 100 mEq/L (ref 96–112)
Glucose, Bld: 141 mg/dL — ABNORMAL HIGH (ref 70–99)
Potassium: 5.1 mEq/L (ref 3.5–5.3)
Sodium: 139 mEq/L (ref 135–145)
Total Protein: 6.8 g/dL (ref 6.0–8.3)

## 2011-11-29 LAB — PTH, INTACT AND CALCIUM: Calcium, Total (PTH): 10.1 mg/dL (ref 8.4–10.5)

## 2011-11-29 NOTE — Addendum Note (Signed)
Addended by: Belia Heman on: 11/29/2011 12:40 PM   Modules accepted: Orders

## 2011-12-02 LAB — T4, FREE: Free T4: 1.17 ng/dL (ref 0.80–1.80)

## 2011-12-03 ENCOUNTER — Other Ambulatory Visit (INDEPENDENT_AMBULATORY_CARE_PROVIDER_SITE_OTHER): Payer: Medicare Other | Admitting: *Deleted

## 2011-12-03 DIAGNOSIS — Z7901 Long term (current) use of anticoagulants: Secondary | ICD-10-CM

## 2011-12-04 ENCOUNTER — Other Ambulatory Visit: Payer: Self-pay | Admitting: Internal Medicine

## 2011-12-10 ENCOUNTER — Other Ambulatory Visit (INDEPENDENT_AMBULATORY_CARE_PROVIDER_SITE_OTHER): Payer: Medicare Other

## 2011-12-10 DIAGNOSIS — E1142 Type 2 diabetes mellitus with diabetic polyneuropathy: Secondary | ICD-10-CM

## 2011-12-10 DIAGNOSIS — Z7901 Long term (current) use of anticoagulants: Secondary | ICD-10-CM

## 2011-12-10 LAB — POCT INR: INR: 2.4

## 2011-12-10 NOTE — Addendum Note (Signed)
Addended by: Bufford Spikes on: 12/10/2011 02:35 PM   Modules accepted: Orders

## 2011-12-31 ENCOUNTER — Encounter (INDEPENDENT_AMBULATORY_CARE_PROVIDER_SITE_OTHER): Payer: Medicare Other | Admitting: Ophthalmology

## 2011-12-31 DIAGNOSIS — H353290 Exudative age-related macular degeneration, unspecified eye, stage unspecified: Secondary | ICD-10-CM

## 2011-12-31 DIAGNOSIS — H43819 Vitreous degeneration, unspecified eye: Secondary | ICD-10-CM

## 2011-12-31 DIAGNOSIS — H3530 Unspecified macular degeneration: Secondary | ICD-10-CM

## 2012-01-15 ENCOUNTER — Other Ambulatory Visit: Payer: Self-pay | Admitting: *Deleted

## 2012-01-15 ENCOUNTER — Other Ambulatory Visit: Payer: Self-pay | Admitting: Internal Medicine

## 2012-01-15 MED ORDER — FLUTICASONE-SALMETEROL 250-50 MCG/DOSE IN AEPB: 1.0000 | INHALATION_SPRAY | Freq: Two times a day (BID) | RESPIRATORY_TRACT | Status: DC

## 2012-02-04 ENCOUNTER — Other Ambulatory Visit: Payer: Self-pay | Admitting: *Deleted

## 2012-02-04 MED ORDER — TIOTROPIUM BROMIDE MONOHYDRATE 18 MCG IN CAPS: 18.0000 ug | ORAL_CAPSULE | Freq: Every day | RESPIRATORY_TRACT | Status: DC

## 2012-02-04 NOTE — Telephone Encounter (Signed)
Pt aware.

## 2012-03-10 ENCOUNTER — Other Ambulatory Visit: Payer: Self-pay | Admitting: Internal Medicine

## 2012-03-13 ENCOUNTER — Other Ambulatory Visit: Payer: Self-pay | Admitting: *Deleted

## 2012-03-17 ENCOUNTER — Other Ambulatory Visit: Payer: Self-pay | Admitting: Internal Medicine

## 2012-03-18 ENCOUNTER — Other Ambulatory Visit: Payer: Self-pay | Admitting: Internal Medicine

## 2012-03-18 MED ORDER — COLCHICINE 0.6 MG PO TABS: 0.6000 mg | ORAL_TABLET | ORAL | Status: DC

## 2012-03-18 MED ORDER — OMEPRAZOLE 20 MG PO CPDR: 40.0000 mg | DELAYED_RELEASE_CAPSULE | Freq: Every day | ORAL | Status: DC

## 2012-03-20 ENCOUNTER — Telehealth: Payer: Self-pay | Admitting: *Deleted

## 2012-03-20 NOTE — Telephone Encounter (Signed)
Received faxed prior authorization request for pt's omeprazole 20 mg take two capsules twice daily #120.  Per chart last rx was written on 03/13/12 for omeprazole 20 mg take two capsules daily #60.  Contacted pt's insurance at [phone] and I was informed that an authorization was not needed.  Also contacted [facility] and made them aware of the new rx as they had the old rx on file.[name] Spittle Cassady1/24/20144:35 PM

## 2012-03-24 ENCOUNTER — Encounter (INDEPENDENT_AMBULATORY_CARE_PROVIDER_SITE_OTHER): Payer: Medicare PPO | Admitting: Ophthalmology

## 2012-03-24 DIAGNOSIS — H3530 Unspecified macular degeneration: Secondary | ICD-10-CM

## 2012-03-24 DIAGNOSIS — H353290 Exudative age-related macular degeneration, unspecified eye, stage unspecified: Secondary | ICD-10-CM

## 2012-03-24 DIAGNOSIS — H35039 Hypertensive retinopathy, unspecified eye: Secondary | ICD-10-CM

## 2012-03-24 DIAGNOSIS — H43819 Vitreous degeneration, unspecified eye: Secondary | ICD-10-CM

## 2012-03-24 DIAGNOSIS — I10 Essential (primary) hypertension: Secondary | ICD-10-CM

## 2012-03-31 ENCOUNTER — Ambulatory Visit (INDEPENDENT_AMBULATORY_CARE_PROVIDER_SITE_OTHER): Payer: Medicare PPO | Admitting: Internal Medicine

## 2012-03-31 VITALS — BP 102/66 | HR 63 | Temp 97.1°F | Ht 62.0 in | Wt 288.5 lb

## 2012-03-31 DIAGNOSIS — I4891 Unspecified atrial fibrillation: Secondary | ICD-10-CM

## 2012-03-31 DIAGNOSIS — Z7901 Long term (current) use of anticoagulants: Secondary | ICD-10-CM

## 2012-03-31 DIAGNOSIS — R209 Unspecified disturbances of skin sensation: Secondary | ICD-10-CM

## 2012-03-31 DIAGNOSIS — E1142 Type 2 diabetes mellitus with diabetic polyneuropathy: Secondary | ICD-10-CM

## 2012-03-31 DIAGNOSIS — E1165 Type 2 diabetes mellitus with hyperglycemia: Secondary | ICD-10-CM

## 2012-03-31 DIAGNOSIS — M79609 Pain in unspecified limb: Secondary | ICD-10-CM

## 2012-03-31 DIAGNOSIS — M1A9XX0 Chronic gout, unspecified, without tophus (tophi): Secondary | ICD-10-CM

## 2012-03-31 DIAGNOSIS — N289 Disorder of kidney and ureter, unspecified: Secondary | ICD-10-CM

## 2012-03-31 DIAGNOSIS — E039 Hypothyroidism, unspecified: Secondary | ICD-10-CM

## 2012-03-31 DIAGNOSIS — I509 Heart failure, unspecified: Secondary | ICD-10-CM

## 2012-03-31 DIAGNOSIS — J449 Chronic obstructive pulmonary disease, unspecified: Secondary | ICD-10-CM

## 2012-03-31 DIAGNOSIS — E119 Type 2 diabetes mellitus without complications: Secondary | ICD-10-CM

## 2012-03-31 LAB — CBC WITH DIFFERENTIAL/PLATELET
Basophils Absolute: 0 10*3/uL (ref 0.0–0.1)
Basophils Relative: 0 % (ref 0–1)
Hemoglobin: 13.5 g/dL (ref 12.0–15.0)
MCHC: 33.3 g/dL (ref 30.0–36.0)
Monocytes Relative: 7 % (ref 3–12)
Neutro Abs: 6.1 10*3/uL (ref 1.7–7.7)
Neutrophils Relative %: 65 % (ref 43–77)
Platelets: 228 10*3/uL (ref 150–400)
RDW: 14.6 % (ref 11.5–15.5)

## 2012-03-31 LAB — COMPLETE METABOLIC PANEL WITH GFR
Albumin: 3.6 g/dL (ref 3.5–5.2)
Alkaline Phosphatase: 93 U/L (ref 39–117)
BUN: 41 mg/dL — ABNORMAL HIGH (ref 6–23)
GFR, Est African American: 36 mL/min — ABNORMAL LOW
GFR, Est Non African American: 32 mL/min — ABNORMAL LOW
Glucose, Bld: 176 mg/dL — ABNORMAL HIGH (ref 70–99)
Potassium: 4.5 mEq/L (ref 3.5–5.3)
Total Bilirubin: 0.5 mg/dL (ref 0.3–1.2)

## 2012-03-31 LAB — POCT INR: INR: 1.4

## 2012-03-31 LAB — POCT GLYCOSYLATED HEMOGLOBIN (HGB A1C): Hemoglobin A1C: 7

## 2012-03-31 LAB — URIC ACID: Uric Acid, Serum: 9.8 mg/dL — ABNORMAL HIGH (ref 2.4–7.0)

## 2012-03-31 MED ORDER — FUROSEMIDE 40 MG PO TABS: 60.0000 mg | ORAL_TABLET | Freq: Every day | ORAL | Status: DC

## 2012-03-31 MED ORDER — GABAPENTIN 100 MG PO CAPS: 200.0000 mg | ORAL_CAPSULE | Freq: Two times a day (BID) | ORAL | Status: DC

## 2012-03-31 NOTE — Progress Notes (Addendum)
Subjective:    Patient ID: [name], female    DOB: 02-28-1932, 79 y.o.   MRN: [id number]  HPI [name] is a 79 y.o. year old obese female with past medical problems significant for diabetes type 2 on oral agents, and gout with renal insufficiency, atrial fibrillation on coumadin who comes to the clinic today for regular follow up. Today she discussed the following issues with me.  Leg pain and Numbness, and cold sensation on left calf: Today she brings up a complain increasing leg pain and numbness beginning at her knees down to her feet, which has started since the past couple weeks. She does not have this at night though and is able to sleep well. It does not interfere with her walking. She does not have sciatica or back pain. She denies any trauma to the legs. The lateral side of her left calf is slightly colder to touch. She reports normal sensation on that side, and no tenderness.   Diabetes: She does not check her blood sugars and is not very particular about diet either. She takes glipizide 10 mg daily. She is not wheelchair bound, and does walk around in the house. She denies having any symptoms of hypoglycemia.   Gout: She has had gout chronically for multiple years, mostly in both the big toes. She gets an attack a month usually which lasts for a week. She is on colchicine for prophylaxis of gout, however she uses the medication as an acute drug mostly. She does not take NSAIDS because she has had history of GI bleeding and is also on coumadin for atrial fibrillation. She takes Tylenol Extra Strength 500 mg, 2 tablets BID daily.    Anticoagulation: The patient wanted an INR check today. She sees Dr. Michaell Cowing for her coumadin management. She takes 5 mg coumadin daily. She denies any bleeding episodes, chest pain, dizziness or fainting.   Renal Insufficiency: She was not aware about this. I informed her. She makes good urine, and does not have any abdominal pain. She has no  dysuria.    PAST MEDICAL HISTORY  has a past medical history of CAD (coronary artery disease); Abdominal pain; CVD (cardiovascular disease); Diastolic dysfunction; Respiratory failure, chronic; Urinary incontinence; Gout; Hypothyroidism; Hyperlipidemia; Depression; Morbidly obese; Gastroesophageal reflux disease; Ventral hernia; DVT (deep venous thrombosis); Pulmonary embolism; Diabetes mellitus; Hypertension; Pyelonephritis; History of recurrent UTIs; AF (atrial fibrillation); Osteoarthritis; Myocardial infarction; Stroke; Blood transfusion; and Anemia.  MEDICATIONS Gout: Tylenol Extra Strength 500 mg 2 tabs daily, Colchicine 0.6 mg every alternate day Asthma: Albuterol inhaler, Singulair 10 mg daily, Spiriva 18 mcg,  Hypertension: Amlodipine 5 mg daily, Spironolactone 25 mg daily, Losartan 50 mg daily Depression: Fluoxetine 20 mg daily Leg swelling/diastolic dysfunction: Lasix 80 mg daily Neuropathy: Gabapentin 200 mg daily Diabetes: Glipizide 10 mg daily Atrial fibrillation: Warfarin 5 mg daily Hypothyroidism: Synthroid 125 mcg daily  Review of Systems  Constitutional: Negative for fever, chills and diaphoresis.  HENT: Negative for facial swelling and neck pain.   Eyes: Positive for visual disturbance (chronic, getting better).  Respiratory: Positive for shortness of breath (chronic). Negative for chest tightness.   Cardiovascular: Negative for chest pain, palpitations and leg swelling.  Genitourinary: Negative for dysuria.  Musculoskeletal: Positive for arthralgias. Negative for back pain, joint swelling and gait problem.  Neurological: Positive for numbness (left leg). Negative for dizziness, seizures, syncope, speech difficulty, weakness and headaches.  Hematological: Negative.   Psychiatric/Behavioral: Negative.       Objective:  Physical Exam  Constitutional: She is oriented to person, place, and time. No distress.       Obese, sitting in wheelchair  HENT:  Head:  Normocephalic and atraumatic.  Eyes: Conjunctivae normal and EOM are normal. Pupils are equal, round, and reactive to light.  Neck: Normal range of motion. Neck supple.  Cardiovascular: Normal rate and intact distal pulses.   No murmur heard.      Irregular irregular rhythm  Pulmonary/Chest: Effort normal and breath sounds normal.  Abdominal: Soft. Bowel sounds are normal.  Musculoskeletal: She exhibits no edema and no tenderness.       Feet:       Feet: No calluses, open skin wounds, or erythematous patches noted. Pulses present in both dorsalis pedis.  Lymphadenopathy:    She has no cervical adenopathy.  Neurological: She is alert and oriented to person, place, and time.       Normal ankle jerk bilaterally. Normal pulses. Normal strength in dorsiflexion and plantar flexion. Touch sensation normal bilaterally in feet.  Skin: Skin is warm.  Psychiatric: She has a normal mood and affect.      Assessment & Plan:  [name] is a pleasant 79 year old lady who is diabetic, hypertensive, has gout and is now having mild renal insufficiency which could be multifactorial in origin.   Renal Insufficiency, Stage G3: [name]'s creatinine values basically have started trending up since 06/18/10 and have never trended back to normal ever since.   Results for [name], [name] (MRN [id number]) as of 04/01/2012 12:54  Ref. Range 02/28/2011 16:32 03/01/2011 07:20 06/18/2011 16:20 11/28/2011 15:02 03/31/2012 09:50  Creatinine Latest Range: 0.50-1.10 mg/dL 4.09 (H) 8.11 (H) 9.14 (H) 1.39 (H) 1.57 (H)  Results for [name], [name] (MRN [id number]) as of 04/01/2012 12:54  Ref. Range 02/28/2011 16:32 03/01/2011 07:20 06/18/2011 16:20 11/28/2011 15:02 03/31/2012 09:50  BUN Latest Range: 6-23 mg/dL 30 (H) 28 (H) 37 (H) 37 (H) 41 (H)    There are several reasons that she could be having this. It could be diabetic or hypertensive nephropathy, or medication related (diuretics, colchicine, cozaar). However, her most recent BUN  values seem to be disproportionately high in comparison to Creatinine rise, the BUN/Cr ration being  (41/1.57) = 26 which indicates pre-renal azotemia state. Given this, I will decrease her lasix from 80 mg to 60 mg and watch for change. Another indication of this is given by the fact that her BUN started rising first back in 2011 (probably mild hypovolemia) which was followed by increase in creatinine.   Colchicine is another drug that the patient has been taking which might lead to renal insufficiency. However, the patient has not been using it daily as a prophylactic medication like the prescription indicates.    Referral to a nephrologist (and cardiologist) at some point might be essential.  Anticoagulation: The INR today was 1.2. She was asked to take 7.5 mg of coumadin on 2/5 and 2/6 and then come back to her regular dose. I have asked her to come in next week for her repeat INR and schedule an appointment with Dr. Michaell Cowing at that time. I forgot to write this instruction in the patient's instructions, so I called her about this.   Leg numbness and pain: This could be multifactorial as well, however the most likely cause seems diabetes neuropathy. The coldness on a patch of her left calf could also be possibly related to mononeuritis related to diabetes which might be interuppting some vascular  supply.  I have increased her gabapentin to 200 mg twice daily. We will follow up on that.   Diabetes: The patient's A1c was 7.0. We will continue current therapy. She has visited her doctors for her foot and eye exam. She has macular degeneration as a complication from diabetes already. Next visit, we will get those records.  Right heart function: The patients last ECHO was done in 2008 which showed a limited LV function with EF of 55%, moderate LVH, mild RV dilation, and mild regurgitation from aortic, tricuspid and mitral valves. We will repeat an ECHO to reassess her right heart function. Currently she is  on spironolactone 25 mg and lasix 80 daily which I have reduced to 60 daily today.  Hypothyroidism: Her TSH is as shown. Values are trending down. I will advise her to take 150 mcg of synthroid from today.   Results for [name], [name] (MRN [id number]) as of 04/01/2012 10:03  Ref. Range 02/28/2011 16:32 02/28/2011 20:57 04/19/2011 11:16 11/28/2011 15:02 03/31/2012 09:50  TSH Latest Range: 0.350-4.500 uIU/mL 29.774 (H)  6.517 (H) 11.762 (H) 9.464 (H)    NEXT VISIT in 3-4 weeks (patient wants 4-6 weeks). Get release papers signed and get eye and foot doctor records.    I discussed her labs and changes in the medicines on the phone today.

## 2012-03-31 NOTE — Patient Instructions (Addendum)
Dear Ms [name],   We made a couple changes in your medications today to try to protect your kidneys which are declining a bit.   1) We decreased LASIX from 80 mh to 60 mg daily. Now you will take 1.5 tablets (40 mg tablet that you have) daily. 2) We increased GABAPENTIN to 200 mg twice daily.  3) We did blood work and I will adjust your COLCHICINE and SYNTHROID once I get the results of the blood work.   Your diabetes is well controlled. Please taking the medicines that you are taking. You are doing great. I am glad that you have seen your eye and foot doctors. We will get the records.  Next visit: in 4 - 6 weeks to review the status of your kidneys.

## 2012-04-01 ENCOUNTER — Encounter: Payer: Self-pay | Admitting: Internal Medicine

## 2012-04-01 ENCOUNTER — Telehealth: Payer: Self-pay | Admitting: *Deleted

## 2012-04-01 MED ORDER — LEVOTHYROXINE SODIUM 150 MCG PO TABS: 150.0000 ug | ORAL_TABLET | Freq: Every day | ORAL | Status: DC

## 2012-04-01 MED ORDER — ALBUTEROL SULFATE HFA 108 (90 BASE) MCG/ACT IN AERS: 2.0000 | INHALATION_SPRAY | Freq: Four times a day (QID) | RESPIRATORY_TRACT | Status: DC | PRN

## 2012-04-01 MED ORDER — GABAPENTIN 100 MG PO CAPS: 200.0000 mg | ORAL_CAPSULE | Freq: Two times a day (BID) | ORAL | Status: DC

## 2012-04-01 NOTE — Telephone Encounter (Signed)
The patient needs to take 200 mg in the morning and 200 mg in the evening. This change was made yesterday.

## 2012-04-01 NOTE — Telephone Encounter (Signed)
Talked to Dr Dalphine Handing, pt does not need a refill at this time.

## 2012-04-01 NOTE — Telephone Encounter (Signed)
Gabapentin was last refilled 2/4 Qty# 90 with directions to Take two caps two times daily (which would be qty#120). Fax from [facility] pharmacy stated one cap two times daily.  Thanks

## 2012-04-06 ENCOUNTER — Ambulatory Visit (INDEPENDENT_AMBULATORY_CARE_PROVIDER_SITE_OTHER): Payer: Medicare PPO | Admitting: Pharmacist

## 2012-04-06 DIAGNOSIS — Z7901 Long term (current) use of anticoagulants: Secondary | ICD-10-CM

## 2012-04-06 DIAGNOSIS — I4891 Unspecified atrial fibrillation: Secondary | ICD-10-CM

## 2012-04-06 NOTE — Patient Instructions (Signed)
Patient instructed to take medications as defined in the Anti-coagulation Track section of this encounter.  Patient instructed to take today's dose.  Patient verbalized understanding of these instructions.    

## 2012-04-06 NOTE — Progress Notes (Signed)
Anti-Coagulation Progress Note  [name] is a 79 y.o. female who is currently on an anti-coagulation regimen.    RECENT RESULTS: Recent results are below, the most recent result is correlated with a dose of 40 mg. per week: Lab Results  Component Value Date   INR 2.50 04/06/2012   INR 1.4 03/31/2012   INR 2.4 12/10/2011    ANTI-COAG DOSE: Anticoagulation Dose Instructions as of 04/06/2012     [name] Tue Wed Thu Fri Sat   New Dose 5 mg 7.5 mg 5 mg 5 mg 7.5 mg 5 mg 5 mg       ANTICOAG SUMMARY: Anticoagulation Episode Summary   Current INR goal 2.0-3.0  Next INR check 04/27/2012  INR from last check 2.50 (04/06/2012)  Weekly max dose   Target end date Indefinite  INR check location   Preferred lab   Send INR reminders to ANTICOAG IMP   Indications  Long-term (current) use of anticoagulants [V58.61] AF (atrial fibrillation) (Resolved) [427.31]        Comments       Anticoagulation Care Providers   Provider Role Specialty Phone number   Ulyess Mort, MD  Internal Medicine [phone]      ANTICOAG TODAY: Anticoagulation Summary as of 04/06/2012   INR goal 2.0-3.0  Selected INR 2.50 (04/06/2012)  Next INR check 04/27/2012  Target end date Indefinite   Indications  Long-term (current) use of anticoagulants [V58.61] AF (atrial fibrillation) (Resolved) [427.31]      Anticoagulation Episode Summary   INR check location    Preferred lab    Send INR reminders to ANTICOAG IMP   Comments     Anticoagulation Care Providers   Provider Role Specialty Phone number   Ulyess Mort, MD  Internal Medicine [phone]      PATIENT INSTRUCTIONS: Patient Instructions  Patient instructed to take medications as defined in the Anti-coagulation Track section of this encounter.  Patient instructed to take today's dose.  Patient verbalized understanding of these instructions.       FOLLOW-UP Return in 3 weeks (on 04/27/2012) for Follow up INR at 1100h.  Hulen Luster,  III Pharm.D., CACP

## 2012-04-06 NOTE — Progress Notes (Signed)
Agree 

## 2012-04-17 ENCOUNTER — Other Ambulatory Visit: Payer: Self-pay | Admitting: Internal Medicine

## 2012-04-20 ENCOUNTER — Encounter (HOSPITAL_COMMUNITY): Payer: Self-pay | Admitting: *Deleted

## 2012-04-20 ENCOUNTER — Ambulatory Visit: Payer: Medicare PPO | Admitting: Internal Medicine

## 2012-04-20 ENCOUNTER — Emergency Department (HOSPITAL_COMMUNITY): Payer: Medicare PPO

## 2012-04-20 ENCOUNTER — Telehealth: Payer: Self-pay | Admitting: *Deleted

## 2012-04-20 ENCOUNTER — Inpatient Hospital Stay (HOSPITAL_COMMUNITY)
Admission: EM | Admit: 2012-04-20 | Discharge: 2012-04-23 | DRG: 292 | Disposition: A | Discharge status: Home Health | Payer: Medicare PPO | Attending: Internal Medicine | Admitting: Internal Medicine

## 2012-04-20 DIAGNOSIS — E119 Type 2 diabetes mellitus without complications: Secondary | ICD-10-CM | POA: Diagnosis present

## 2012-04-20 DIAGNOSIS — Z9981 Dependence on supplemental oxygen: Secondary | ICD-10-CM

## 2012-04-20 DIAGNOSIS — Z86718 Personal history of other venous thrombosis and embolism: Secondary | ICD-10-CM

## 2012-04-20 DIAGNOSIS — M1990 Unspecified osteoarthritis, unspecified site: Secondary | ICD-10-CM | POA: Diagnosis present

## 2012-04-20 DIAGNOSIS — N183 Chronic kidney disease, stage 3 unspecified: Secondary | ICD-10-CM | POA: Diagnosis present

## 2012-04-20 DIAGNOSIS — I129 Hypertensive chronic kidney disease with stage 1 through stage 4 chronic kidney disease, or unspecified chronic kidney disease: Secondary | ICD-10-CM | POA: Diagnosis present

## 2012-04-20 DIAGNOSIS — J9610 Chronic respiratory failure, unspecified whether with hypoxia or hypercapnia: Secondary | ICD-10-CM | POA: Diagnosis present

## 2012-04-20 DIAGNOSIS — Z6841 Body Mass Index (BMI) 40.0 and over, adult: Secondary | ICD-10-CM

## 2012-04-20 DIAGNOSIS — Z8673 Personal history of transient ischemic attack (TIA), and cerebral infarction without residual deficits: Secondary | ICD-10-CM

## 2012-04-20 DIAGNOSIS — J449 Chronic obstructive pulmonary disease, unspecified: Secondary | ICD-10-CM

## 2012-04-20 DIAGNOSIS — I4891 Unspecified atrial fibrillation: Secondary | ICD-10-CM

## 2012-04-20 DIAGNOSIS — Z87891 Personal history of nicotine dependence: Secondary | ICD-10-CM

## 2012-04-20 DIAGNOSIS — K5900 Constipation, unspecified: Secondary | ICD-10-CM | POA: Diagnosis present

## 2012-04-20 DIAGNOSIS — E669 Obesity, unspecified: Secondary | ICD-10-CM | POA: Diagnosis present

## 2012-04-20 DIAGNOSIS — Z9884 Bariatric surgery status: Secondary | ICD-10-CM

## 2012-04-20 DIAGNOSIS — Z7901 Long term (current) use of anticoagulants: Secondary | ICD-10-CM

## 2012-04-20 DIAGNOSIS — I2510 Atherosclerotic heart disease of native coronary artery without angina pectoris: Secondary | ICD-10-CM

## 2012-04-20 DIAGNOSIS — E118 Type 2 diabetes mellitus with unspecified complications: Secondary | ICD-10-CM | POA: Diagnosis present

## 2012-04-20 DIAGNOSIS — E785 Hyperlipidemia, unspecified: Secondary | ICD-10-CM | POA: Diagnosis present

## 2012-04-20 DIAGNOSIS — Z79899 Other long term (current) drug therapy: Secondary | ICD-10-CM

## 2012-04-20 DIAGNOSIS — M109 Gout, unspecified: Secondary | ICD-10-CM | POA: Diagnosis present

## 2012-04-20 DIAGNOSIS — K219 Gastro-esophageal reflux disease without esophagitis: Secondary | ICD-10-CM | POA: Diagnosis present

## 2012-04-20 DIAGNOSIS — F3289 Other specified depressive episodes: Secondary | ICD-10-CM | POA: Diagnosis present

## 2012-04-20 DIAGNOSIS — I509 Heart failure, unspecified: Secondary | ICD-10-CM | POA: Diagnosis present

## 2012-04-20 DIAGNOSIS — R0989 Other specified symptoms and signs involving the circulatory and respiratory systems: Secondary | ICD-10-CM

## 2012-04-20 DIAGNOSIS — I10 Essential (primary) hypertension: Secondary | ICD-10-CM | POA: Diagnosis present

## 2012-04-20 DIAGNOSIS — F329 Major depressive disorder, single episode, unspecified: Secondary | ICD-10-CM | POA: Diagnosis present

## 2012-04-20 DIAGNOSIS — I252 Old myocardial infarction: Secondary | ICD-10-CM

## 2012-04-20 DIAGNOSIS — E039 Hypothyroidism, unspecified: Secondary | ICD-10-CM | POA: Diagnosis present

## 2012-04-20 DIAGNOSIS — Z86711 Personal history of pulmonary embolism: Secondary | ICD-10-CM

## 2012-04-20 DIAGNOSIS — I5033 Acute on chronic diastolic (congestive) heart failure: Principal | ICD-10-CM

## 2012-04-20 DIAGNOSIS — R197 Diarrhea, unspecified: Secondary | ICD-10-CM | POA: Diagnosis not present

## 2012-04-20 HISTORY — DX: Chronic diastolic (congestive) heart failure: I50.32

## 2012-04-20 HISTORY — DX: Postprocedural hypothyroidism: E89.0

## 2012-04-20 LAB — TROPONIN I: Troponin I: <0.3 ng/mL (ref <0.30)

## 2012-04-20 LAB — POCT I-STAT TROPONIN I

## 2012-04-20 LAB — CBC
HCT: 40.1 % (ref 36.0–46.0)
Platelets: 255 10*3/uL (ref 150–400)
RBC: 4.23 MIL/uL (ref 3.87–5.11)
RDW: 13.9 % (ref 11.5–15.5)
WBC: 9.9 10*3/uL (ref 4.0–10.5)

## 2012-04-20 LAB — BASIC METABOLIC PANEL
BUN: 35 mg/dL — ABNORMAL HIGH (ref 6–23)
Chloride: 98 mEq/L (ref 96–112)
GFR calc Af Amer: 50 mL/min — ABNORMAL LOW (ref >90)
Potassium: 5.4 mEq/L — ABNORMAL HIGH (ref 3.5–5.1)

## 2012-04-20 LAB — PRO B NATRIURETIC PEPTIDE: Pro B Natriuretic peptide (BNP): 2371 pg/mL — ABNORMAL HIGH (ref 0–450)

## 2012-04-20 LAB — PHOSPHORUS: Phosphorus: 2.9 mg/dL (ref 2.3–4.6)

## 2012-04-20 LAB — HEPATIC FUNCTION PANEL
Indirect Bilirubin: 0.4 mg/dL (ref 0.3–0.9)
Total Protein: 7.2 g/dL (ref 6.0–8.3)

## 2012-04-20 MED ORDER — ACETAMINOPHEN 500 MG PO TABS
1000.0000 mg | ORAL_TABLET | Freq: Two times a day (BID) | ORAL | Status: DC | PRN
  Filled 2012-04-20: qty 2

## 2012-04-20 MED ORDER — ASPIRIN EC 81 MG PO TBEC
81.0000 mg | DELAYED_RELEASE_TABLET | Freq: Every day | ORAL | Status: DC
  Administered 2012-04-21 – 2012-04-23 (×3): 81 mg via ORAL
  Filled 2012-04-20 (×3): qty 1

## 2012-04-20 MED ORDER — SODIUM CHLORIDE 0.9 % IJ SOLN
3.0000 mL | Freq: Two times a day (BID) | INTRAMUSCULAR | Status: DC
  Administered 2012-04-20 – 2012-04-22 (×4): 3 mL via INTRAVENOUS

## 2012-04-20 MED ORDER — PANTOPRAZOLE SODIUM 40 MG PO TBEC
40.0000 mg | DELAYED_RELEASE_TABLET | Freq: Every day | ORAL | Status: DC
  Administered 2012-04-21 – 2012-04-23 (×3): 40 mg via ORAL
  Filled 2012-04-20 (×3): qty 1

## 2012-04-20 MED ORDER — MOMETASONE FURO-FORMOTEROL FUM 100-5 MCG/ACT IN AERO
2.0000 | INHALATION_SPRAY | Freq: Two times a day (BID) | RESPIRATORY_TRACT | Status: DC
  Administered 2012-04-21 – 2012-04-23 (×5): 2 via RESPIRATORY_TRACT
  Filled 2012-04-20 (×2): qty 8.8

## 2012-04-20 MED ORDER — INSULIN ASPART 100 UNIT/ML ~~LOC~~ SOLN
0.0000 [IU] | Freq: Three times a day (TID) | SUBCUTANEOUS | Status: DC
  Administered 2012-04-21 (×2): 2 [IU] via SUBCUTANEOUS
  Administered 2012-04-21 – 2012-04-22 (×2): 1 [IU] via SUBCUTANEOUS
  Administered 2012-04-22 (×2): 2 [IU] via SUBCUTANEOUS
  Administered 2012-04-23 (×2): 1 [IU] via SUBCUTANEOUS

## 2012-04-20 MED ORDER — TIOTROPIUM BROMIDE MONOHYDRATE 18 MCG IN CAPS
18.0000 ug | ORAL_CAPSULE | Freq: Every day | RESPIRATORY_TRACT | Status: DC
  Administered 2012-04-21 – 2012-04-23 (×3): 18 ug via RESPIRATORY_TRACT
  Filled 2012-04-20 (×2): qty 5

## 2012-04-20 MED ORDER — FLUOXETINE HCL 20 MG PO CAPS
20.0000 mg | ORAL_CAPSULE | Freq: Two times a day (BID) | ORAL | Status: DC
  Administered 2012-04-21 – 2012-04-23 (×5): 20 mg via ORAL
  Filled 2012-04-20 (×7): qty 1

## 2012-04-20 MED ORDER — COLCHICINE 0.6 MG PO TABS
0.6000 mg | ORAL_TABLET | ORAL | Status: DC
  Administered 2012-04-21: 0.6 mg via ORAL
  Filled 2012-04-20 (×2): qty 1

## 2012-04-20 MED ORDER — LEVOTHYROXINE SODIUM 150 MCG PO TABS
150.0000 ug | ORAL_TABLET | Freq: Every day | ORAL | Status: DC
  Administered 2012-04-21 – 2012-04-23 (×3): 150 ug via ORAL
  Filled 2012-04-20 (×4): qty 1

## 2012-04-20 MED ORDER — POLYETHYLENE GLYCOL 3350 17 G PO PACK
17.0000 g | PACK | Freq: Every day | ORAL | Status: DC
  Administered 2012-04-21 – 2012-04-22 (×2): 17 g via ORAL
  Filled 2012-04-20 (×2): qty 1

## 2012-04-20 MED ORDER — GABAPENTIN 100 MG PO CAPS
200.0000 mg | ORAL_CAPSULE | Freq: Two times a day (BID) | ORAL | Status: DC
  Administered 2012-04-21 – 2012-04-23 (×5): 200 mg via ORAL
  Filled 2012-04-20 (×7): qty 2

## 2012-04-20 MED ORDER — ALBUTEROL SULFATE HFA 108 (90 BASE) MCG/ACT IN AERS
2.0000 | INHALATION_SPRAY | Freq: Four times a day (QID) | RESPIRATORY_TRACT | Status: DC | PRN
  Filled 2012-04-20: qty 6.7

## 2012-04-20 MED ORDER — MONTELUKAST SODIUM 10 MG PO TABS
10.0000 mg | ORAL_TABLET | Freq: Every day | ORAL | Status: DC
  Administered 2012-04-21 – 2012-04-23 (×3): 10 mg via ORAL
  Filled 2012-04-20 (×4): qty 1

## 2012-04-20 MED ORDER — FUROSEMIDE 40 MG PO TABS
60.0000 mg | ORAL_TABLET | Freq: Two times a day (BID) | ORAL | Status: DC
  Administered 2012-04-21 – 2012-04-23 (×5): 60 mg via ORAL
  Filled 2012-04-20 (×8): qty 1

## 2012-04-20 MED ORDER — ATORVASTATIN CALCIUM 40 MG PO TABS
40.0000 mg | ORAL_TABLET | Freq: Every day | ORAL | Status: DC
  Administered 2012-04-21 – 2012-04-22 (×2): 40 mg via ORAL
  Filled 2012-04-20 (×3): qty 1

## 2012-04-20 NOTE — ED Notes (Signed)
Patient transported to X-ray 

## 2012-04-20 NOTE — ED Notes (Signed)
Attempted to call report to floor.  Was tol rn would return my call

## 2012-04-20 NOTE — ED Notes (Signed)
Report received, assumed care.  

## 2012-04-20 NOTE — ED Notes (Signed)
C/o intermittent left side CP & SOB x 2 days. Today CP started while at rest. States took 2 pain pills (gabapentin) PTA. Describes CP as throbbing. Also c/o non prod cough started today

## 2012-04-20 NOTE — H&P (Signed)
Date: 04/20/2012               Patient Name:  [name] MRN: [id number]  DOB: [birth year] Age / Sex: 79 y.o., female   PCP: Aletta Edouard              Medical Service: Internal Medicine Teaching Service              Attending Physician: Dr. Staci Righter    First Contact: Dr. Darden Palmer Pager: [phone]  Second Contact: Dr. Dede Query Pager: [phone]            After Hours (After 5p/  First Contact Pager: [phone]  weekends / holidays): Second Contact Pager: [phone]      Chief Complaint: cough and shortness of breath  History of Present Illness: Patient is a 79 y.o. female with a PMHx of oxygen dependent COPD (on 2L [place]), controlled DM2, atrial fibrillation on chronic coumadin, CAD (prior MI 20 years ago), CVD (without residual deficits) who presents to [facility] for evaluation of shortness of breath and nonproductive cough x 2 days. Patient describes shortness of breath as persistent throughout the day despite level of activity, and with associated mild wheezing and mild increased lower extremity edema from her baseline. This has required her to increase albuterol usage from 3 times to 4-5 times daily. She confirms chronic orthopnea and PND that are unchanged from baseline. Is not having any chest pain.  Overall, she reports that she is feeling much more fatigued.  Denies recent sick contact, fevers, chills, change in appetite.  Recent decrease of lasix dose from 80mg  daily to 60 mg daily. She does not check her weight regularly for her heart failure.  Few week history of sore throat and nasal congestion.   Review of Systems: Constitutional:  Admits to fatigue.  Denies fever, chills, diaphoresis, appetite change.  HEENT: admits to sensation of ears being clogged, sore throat. Denies photophobia, eye pain, ear pain, congestion, rhinorrhea, sneezing.  Respiratory: admits to SOB, DOE, cough, and wheezing.  Cardiovascular: admits to BL LE edema, chest tightness. Denies  palpitations.  Gastrointestinal: admits to constipation. Denies nausea, vomiting, abdominal pain, diarrhea, blood in stool.  Genitourinary: denies dysuria, hematuria, difficulty urinating.  Musculoskeletal: admits to chronic arthralgias. Denies myalgias, back pain, joint swelling, arthralgias and gait problem.   Skin: denies pallor, rash and wound.  Neurological: admits to occasional dizziness. Denies seizures, syncope, focal weakness, numbness and headaches.   Hematological: Denies bruising.   Psychiatric/ Behavioral: Depression, stable.     Current Outpatient Medications: Current Outpatient Prescriptions  Medication Sig Dispense Refill  . acetaminophen (TYLENOL) 500 MG tablet Take 1,000 mg by mouth 2 (two) times daily as needed. For pain.      Marland Kitchen albuterol (PROVENTIL HFA) 108 (90 BASE) MCG/ACT inhaler Inhale 2 puffs into the lungs every 6 (six) hours as needed. For wheezing.  3.7 g  11  . amLODipine (NORVASC) 5 MG tablet Take 5 mg by mouth daily.      Marland Kitchen atorvastatin (LIPITOR) 40 MG tablet TAKE ONE TABLET BY MOUTH EVERY DAY  30 tablet  4  . colchicine (COLCRYS) 0.6 MG tablet Take 1 tablet (0.6 mg total) by mouth every other day.  30 tablet  0  . FLUoxetine (PROZAC) 20 MG capsule Take 20 mg by mouth 2 (two) times daily.      . Fluticasone-Salmeterol (ADVAIR) 250-50 MCG/DOSE AEPB Inhale 1 puff into the lungs every 12 (twelve) hours.  60 each  3  . furosemide (LASIX) 40 MG tablet Take 20-40 mg by mouth 2 (two) times daily. 40 mg every morning and 20 mg at bedtime      . gabapentin (NEURONTIN) 100 MG capsule TAKE TWO CAPSULES BY MOUTH TWICE DAILY  120 capsule  2  . glipiZIDE (GLUCOTROL) 10 MG tablet TAKE ONE TABLET BY MOUTH TWICE DAILY BEFORE A MEAL  60 tablet  6  . levothyroxine (SYNTHROID) 150 MCG tablet Take 1 tablet (150 mcg total) by mouth daily.  30 tablet  11  . losartan (COZAAR) 50 MG tablet TAKE ONE TABLET BY MOUTH TWICE DAILY  60 tablet  4  . montelukast (SINGULAIR) 10 MG tablet  TAKE ONE TABLET BY MOUTH EVERY DAY  30 tablet  6  . omeprazole (PRILOSEC) 20 MG capsule Take 40 mg by mouth 2 (two) times daily.      . polyethylene glycol (MIRALAX / GLYCOLAX) packet Take 17 g by mouth daily.      Marland Kitchen spironolactone (ALDACTONE) 25 MG tablet TAKE ONE TABLET BY MOUTH EVERY DAY  31 tablet  5  . tiotropium (SPIRIVA HANDIHALER) 18 MCG inhalation capsule Place 1 capsule (18 mcg total) into inhaler and inhale daily.  30 capsule  4  . warfarin (COUMADIN) 5 MG tablet Take 5 mg by mouth every evening.        Allergies: Allergies  Allergen Reactions  . Nitrofurantoin (Macrodantin) Itching     Past Medical History: Past Medical History  Diagnosis Date  . CAD (coronary artery disease)     s/p remote MI (1993)  . Abdominal pain     recent admission, likely secondary to presbyesophagus and gastric dysmotility  . CVD (cardiovascular disease)   . Chronic diastolic heart failure     with dilated cardiomyopathy, last EF 55%(09/2006)  . Respiratory failure, chronic     mixed etiology with bronchospastic component  . Urinary incontinence     recurrent uti's/resistance to cipro, bactrim  . Gout   . Postablative hypothyroidism     H/o Graves disease s/p radioactive iodine ablation with resultant postablative hypothyroidism  . Hyperlipidemia   . Depression   . Morbidly obese     s/p gastric plication surgery  . Gastroesophageal reflux disease   . Ventral hernia     repair in April 2008, complicated by MRSA abdominal wall cellulitis  . DVT (deep venous thrombosis) 1998  . Pulmonary embolism 1998    1998, s/p Greenfield filter  . Diabetes mellitus type 2, controlled, with complications   . Hypertension   . History of recurrent UTIs     and history of pyelonephritis  . AF (atrial fibrillation)     on chronic warfarin  . Osteoarthritis   . Myocardial infarction   . Stroke 1997    denies residual  . Anemia     Blood transfusion [V58.2]    Past Surgical History: Past Surgical  History  Procedure Laterality Date  . Cholecystectomy    . Appendectomy    . Breast biopsy    . Gastric bypass  1970's  . Hernia repair  05/2006    ventral hernia  . Greenfield filter placement  1998    Family History: Family History  Problem Relation Age of Onset  . Colon cancer Neg Hx     Social History: History   Social History  . Marital Status: Widowed    Spouse Name: N/A    Number of Children: N/A  . Years of Education: N/A  Occupational History  . Not on file.   Social History Main Topics  . Smoking status: Former Smoker -- 1.00 packs/day for 1.5 years    Types: Cigarettes  . Smokeless tobacco: Never Used  . Alcohol Use: No  . Drug Use: No  . Sexually Active: No   Other Topics Concern  . Not on file   Social History Narrative   She lives w/her son. Her son offers help and she has an aid who occasionally cooks for her and takes care of some other tasks around the pt's house.   Has medicare. Has son in [place] who is POA   Ambivalent about DNR issues and does not want a STOP sign posted in home           Vital Signs: Blood pressure 115/48, pulse 56, temperature 97.8 F (36.6 C), temperature source Oral, resp. rate 16, last menstrual period 05/22/1968, SpO2 94.00%.  Physical Exam: General: Vital signs reviewed and noted. Obese female in NAD; alert, appropriate and cooperative throughout examination.  Head: Normocephalic, atraumatic.  Eyes: Abnormal pupillary constriction (prior h/o bilateral cataract surgery), EOMI, No signs of anemia or jaundince.  Ears: Cerumen impacted bilaterally.  Throat: Oropharynx nonerythematous, no exudate appreciated.   Neck: No deformities, masses, or tenderness noted. JVD not appreciated  Lungs:  Patient with forceful expiration, wheezing sound emanating from throat. Lung fields are actually clear without crackles.    Heart: Bradycardic w HR 55, rhythm is regular. S1 and S2 normal without gallop, murmur, or rubs.  Abdomen:   Obses. BS normoactive. Soft, Nondistended, non-tender.  No masses or organomegaly.  Extremities: 1+ pretibial edema from above ankle to below knee bilaterally. Good peripheral pulses  Neurologic: A&O X3, CN II - XII are grossly intact. Motor strength is 5/5 in the all 4 extremities, Sensations intact to light touch, Cerebellar signs negative.  Skin: Midline surgical abdominal scar, several scattered, excoriated plaques on abdomen which are without signs of infection   Lab results:  CURRENT LABS: CBC:    Component Value Date/Time   WBC 9.9 04/20/2012 1730   HGB 13.3 04/20/2012 1730   HCT 40.1 04/20/2012 1730   PLT 255 04/20/2012 1730   MCV 94.8 04/20/2012 1730   NEUTROABS 6.1 03/31/2012 0950   LYMPHSABS 2.3 03/31/2012 0950   MONOABS 0.6 03/31/2012 0950   EOSABS 0.3 03/31/2012 0950   BASOSABS 0.0 03/31/2012 0950     Metabolic Panel:    Component Value Date/Time   NA 135 04/20/2012 1730   K 5.4* 04/20/2012 1730   CL 98 04/20/2012 1730   CO2 26 04/20/2012 1730   BUN 35* 04/20/2012 1730   CREATININE 1.18* 04/20/2012 1730   CREATININE 1.57* 03/31/2012 0950   GLUCOSE 170* 04/20/2012 1730   CALCIUM 10.4 04/20/2012 1730   CALCIUM 10.1 11/28/2011 1502   AST 17 04/20/2012 2112   ALT 15 04/20/2012 2112   ALKPHOS 134* 04/20/2012 2112   BILITOT 0.5 04/20/2012 2112   PROT 7.2 04/20/2012 2112   ALBUMIN 3.2* 04/20/2012 2112    Troponin (Point of Care Test)  Recent Labs  04/20/12 1746  TROPIPOC 0.02     HISTORICAL LABS: Lab Results  Component Value Date   HGBA1C 7.0 03/31/2012    Lab Results  Component Value Date   CHOL 136 08/02/2011   HDL 36* 08/02/2011   LDLCALC 62 08/02/2011   TRIG 192* 08/02/2011   CHOLHDL 3.8 08/02/2011    Lab Results  Component Value Date   TSH 9.464*  03/31/2012     Imaging results:  Dg Chest 2 View  04/20/2012  *RADIOLOGY REPORT*  Clinical Data: Chest pain and shortness of breath  CHEST - 2 VIEW  Comparison: 03/27/2011 and prior chest radiographs  Findings: Upper limits normal  heart size and mild peribronchial thickening again noted. Elevation of the right hemidiaphragm is unchanged. There is no evidence of focal airspace disease, pulmonary edema, suspicious pulmonary nodule/mass, pleural effusion, or pneumothorax. No acute bony abnormalities are identified.  IMPRESSION: No evidence of acute cardiopulmonary disease.   Original Report Authenticated By: Harmon Pier, M.D.      Other results:  EKG (04/20/2012) - Sinus rhythm w rate 69, normal intervals, indeterminate axis, nonspecific ST segment changes. Compared to prior, now in sinus rhythm; in addition, axis flipped in lead 1, avL - may be lead placement issue. Will recheck EKG    Assessment & Plan:  Pt is a 78 y.o. yo female with a PMHx of xygen dependent COPD (on 2L [place]), controlled DM2, atrial fibrillation on chronic coumadin, CAD (prior MI 20 years ago), CVD (without residual deficits)  who was admitted on 04/20/2012 with symptoms of chest tightness and shortness of breath. Interventions at this time will be focused on treating CHF and investigating other contributory causes of worsening dyspnea.    1) Dyspnea Some physical exam features consistent with heart failure exacerbation. She does have interval increase of pro-BNP and 1+ bilateral pitting edema in the setting of recent decrease in daily lasix dose. No pulmonary edema on CXR or rales on exam. Oxygenating well at home O2 flow rate.  She has a documented history of prior CM with EF 40%, most recent echo shows EF 55%. She has diagnosis of diastolic dysfunction, although this was not specifically commented on at her last echo in 2008. At her recent visit with PCP, outpatient echo was ordered. She also has COPD, but presentation not consistent with COPD exacerbation. Cough is chronic and unchanged, no production. Wheezing sound is transmitted from upper airway and resolves when patient instructed on correct breathing techniques.  - tele bed - cycle CE - Echo -  Increase lasix to 60mg  BID - Cotinue losartan - follow renal function - recheck EKG  2) Atrial fibrillation Patient is on warfarin for atrial fibrillation. She had previously taken diltiazem for rate control, now not on any rate-controlling medications in setting of bradycardia (HR 50s). Bradycardia in absence of nodal blocking agent suggests poorer compensation for atrial fibrillation.  Today, she appears to be in sinus rhythm during our examination. INR is 3. Follows w Dr. Alexandria Lodge as outpatient. - Continue warfarin  3) CAD, nonobstructive Patient has history of prior MI. In 2006, cath was performed which showed 30-40% stenosis of circumflex, no interventions. Not on a beta blocker due to bradycardia. Unclear why not on ASA therapy now, does not have ASA allergy. - Continue statin - ASA 81mg   4) Chronic lung disease, obstructive/restrictive pattern Patient has obstructive and restrictive pattern of lung disease. Currently on albulterol inhaler, advair, spiriva, and singulair at home.  Does not appear to have acute exacerbation at this time. - nebulizers prn, continue advair inhaler  5) Hypothyrodism Symptoms could represent hypothyroidism, but patient's synthroid dose recently increased on 03/31/12 from 125 to 150 mcg daily, making this less likely. - Will check TSH  6) HTN Patient's BP 115/48 in ED.  - Continue ARB,  lasix - Hold amlodipine, spironolacton  7) Diabetes  Last HbA1c 7.0 earlier this month. On glipizide 10mg   daily at home, although per chart she should be taking twice daily.  - Hold glipizide, SSI while inpatient  8) Gout Clinically stable. Uric acid not at goal (9.8 as of 03/31/12). Will not make adjustments while inpatient.  - Continue colchicine  9) Depression Stable.  - Continue fluoxetine  10) GERD Stable. - Continue omeprazole  11) Hyperkalemia  Potassium 5.4, no EKG changes. Should decrease with diuresis, holding spironolactone.  - Continue to  monitor     DVT PPX - warfarin  CODE STATUS - FULL  CONSULTS PLACED - none  DISPO - Disposition is deferred at this time, awaiting improvement of current symptoms   Anticipated discharge in approximately 1-2 day(s).   The patient does have a current PCP (BHARDWAJ, SHILPA, MD) and does need an [facility] hospital follow-up appointment after discharge.    Is the [facility] hospital follow-up appointment a one-time only appointment? not applicable.  Does the patient have transportation limitations that hinder transportation to clinic appointments? unknown.   SERVICE NEEDED AT DISCHARGE - TO BE DETERMINED DURING HOSPITAL COURSE         Y = Yes, Blank = No PT:   OT:   RN:   Equipment:   Other:      Signed: Bronson Curb, MD  PGY-1, Internal Medicine Resident Pager: [phone] ([phone]) 04/20/2012, 10:00 PM

## 2012-04-20 NOTE — ED Notes (Signed)
Pt is here with chest tightness and shortness of breath that started a couple of days ago and states symptoms similar to when she had a heart attack

## 2012-04-20 NOTE — Telephone Encounter (Signed)
Son calls and states pt is "having trouble breathing" he is ask to call 911 now and is agreeable

## 2012-04-20 NOTE — ED Notes (Signed)
The pt has chest pain all day with sob.  She has sob now.  Alert oriented skin warm and dry

## 2012-04-21 ENCOUNTER — Ambulatory Visit: Payer: Medicare PPO | Admitting: Internal Medicine

## 2012-04-21 DIAGNOSIS — I5033 Acute on chronic diastolic (congestive) heart failure: Secondary | ICD-10-CM | POA: Diagnosis present

## 2012-04-21 LAB — GLUCOSE, CAPILLARY
Glucose-Capillary: 129 mg/dL — ABNORMAL HIGH (ref 70–99)
Glucose-Capillary: 169 mg/dL — ABNORMAL HIGH (ref 70–99)
Glucose-Capillary: 158 mg/dL — ABNORMAL HIGH (ref 70–99)

## 2012-04-21 LAB — CBC
Hemoglobin: 12.1 g/dL (ref 12.0–15.0)
MCHC: 32.4 g/dL (ref 30.0–36.0)
RBC: 3.92 MIL/uL (ref 3.87–5.11)
WBC: 8.1 10*3/uL (ref 4.0–10.5)

## 2012-04-21 LAB — COMPREHENSIVE METABOLIC PANEL
BUN: 36 mg/dL — ABNORMAL HIGH (ref 6–23)
CO2: 28 mEq/L (ref 19–32)
Calcium: 9.9 mg/dL (ref 8.4–10.5)
Creatinine, Ser: 1.3 mg/dL — ABNORMAL HIGH (ref 0.50–1.10)
GFR calc Af Amer: 45 mL/min — ABNORMAL LOW (ref >90)
GFR calc non Af Amer: 39 mL/min — ABNORMAL LOW (ref >90)
Glucose, Bld: 132 mg/dL — ABNORMAL HIGH (ref 70–99)

## 2012-04-21 LAB — TROPONIN I: Troponin I: <0.3 ng/mL (ref <0.30)

## 2012-04-21 MED ORDER — WARFARIN SODIUM 5 MG PO TABS
5.0000 mg | ORAL_TABLET | Freq: Every day | ORAL | Status: AC
  Administered 2012-04-21: 5 mg via ORAL
  Filled 2012-04-21 (×2): qty 1

## 2012-04-21 MED ORDER — WARFARIN - PHARMACIST DOSING INPATIENT
Freq: Every day | Status: DC
  Administered 2012-04-21 – 2012-04-22 (×2)

## 2012-04-21 MED ORDER — PERFLUTREN LIPID MICROSPHERE
INTRAVENOUS | Status: AC
  Filled 2012-04-21: qty 10

## 2012-04-21 MED ORDER — PERFLUTREN LIPID MICROSPHERE
1.0000 mL | INTRAVENOUS | Status: AC | PRN
  Administered 2012-04-21: 2 mL via INTRAVENOUS
  Filled 2012-04-21: qty 10

## 2012-04-21 NOTE — Consult Note (Addendum)
Admit date: 04/20/2012 Referring Physician  Dr. Luciana Axe Primary Physician  Dr. Dalphine Handing Primary Cardiologist  None Reason for Consultation  CHF  HPI: Patient is a 79 y.o. female with a PMHx of oxygen dependent COPD (on 2L [place]), controlled DM2, atrial fibrillation on chronic coumadin, CAD (prior MI 20 years ago), CVD (without residual deficits) who presents to [facility] for evaluation of shortness of breath and nonproductive cough x 2 days. Patient describes shortness of breath as persistent throughout the day despite level of activity, and with associated mild wheezing and mild increased lower extremity edema from her baseline. This has required her to increase albuterol usage from 3 times to 4-5 times daily. She confirms chronic orthopnea and PND that are unchanged from baseline. She Is not having any chest pain. Overall, she reports that she is feeling much more fatigued. Denies recent sick contact, fevers, chills, change in appetite. Recent decrease of lasix dose from 80mg  daily to 60 mg daily. She does not check her weight regularly for her heart failure. Few week history of sore throat and nasal congestion. She was found to have an elevated BNP at 2371 but rule out for MI by serial troponin levels.  Her last echo in 2008 showed normal LVF EF 55%.  We are now asked to consult for help with treatment of CHF.     PMH:   Past Medical History  Diagnosis Date  . CAD (coronary artery disease)     s/p remote MI (1993)  . Abdominal pain     recent admission, likely secondary to presbyesophagus and gastric dysmotility  . CVD (cardiovascular disease)   . Chronic diastolic heart failure     with dilated cardiomyopathy, last EF 55%(09/2006)  . Respiratory failure, chronic     mixed etiology with bronchospastic component  . Urinary incontinence     recurrent uti's/resistance to cipro, bactrim  . Gout   . Postablative hypothyroidism     H/o Graves disease s/p radioactive iodine ablation with resultant  postablative hypothyroidism  . Hyperlipidemia   . Depression   . Morbidly obese     s/p gastric plication surgery  . Gastroesophageal reflux disease   . Ventral hernia     repair in April 2008, complicated by MRSA abdominal wall cellulitis  . DVT (deep venous thrombosis) 1998  . Pulmonary embolism 1998    1998, s/p Greenfield filter  . Diabetes mellitus type 2, controlled, with complications   . Hypertension   . History of recurrent UTIs     and history of pyelonephritis  . AF (atrial fibrillation)     on chronic warfarin  . Osteoarthritis   . Myocardial infarction   . Stroke 1997    denies residual  . Anemia     Blood transfusion [V58.2]     PSH:   Past Surgical History  Procedure Laterality Date  . Cholecystectomy    . Appendectomy    . Breast biopsy    . Gastric bypass  1970's  . Hernia repair  05/2006    ventral hernia  . Greenfield filter placement  1998    Allergies:  Nitrofurantoin Prior to Admit Meds:   Prescriptions prior to admission  Medication Sig Dispense Refill  . acetaminophen (TYLENOL) 500 MG tablet Take 1,000 mg by mouth 2 (two) times daily as needed. For pain.      Marland Kitchen albuterol (PROVENTIL HFA) 108 (90 BASE) MCG/ACT inhaler Inhale 2 puffs into the lungs every 6 (six) hours as needed. For wheezing.  3.7 g  11  . amLODipine (NORVASC) 5 MG tablet Take 5 mg by mouth daily.      Marland Kitchen atorvastatin (LIPITOR) 40 MG tablet TAKE ONE TABLET BY MOUTH EVERY DAY  30 tablet  4  . colchicine (COLCRYS) 0.6 MG tablet Take 1 tablet (0.6 mg total) by mouth every other day.  30 tablet  0  . FLUoxetine (PROZAC) 20 MG capsule Take 20 mg by mouth 2 (two) times daily.      . Fluticasone-Salmeterol (ADVAIR) 250-50 MCG/DOSE AEPB Inhale 1 puff into the lungs every 12 (twelve) hours.  60 each  3  . furosemide (LASIX) 40 MG tablet Take 20-40 mg by mouth 2 (two) times daily. 40 mg every morning and 20 mg at bedtime      . gabapentin (NEURONTIN) 100 MG capsule TAKE TWO CAPSULES BY MOUTH  TWICE DAILY  120 capsule  2  . glipiZIDE (GLUCOTROL) 10 MG tablet Take 10 mg by mouth 2 (two) times daily before a meal.      . levothyroxine (SYNTHROID) 150 MCG tablet Take 1 tablet (150 mcg total) by mouth daily.  30 tablet  11  . losartan (COZAAR) 50 MG tablet TAKE ONE TABLET BY MOUTH TWICE DAILY  60 tablet  4  . montelukast (SINGULAIR) 10 MG tablet TAKE ONE TABLET BY MOUTH EVERY DAY  30 tablet  6  . omeprazole (PRILOSEC) 20 MG capsule Take 40 mg by mouth 2 (two) times daily.      . polyethylene glycol (MIRALAX / GLYCOLAX) packet Take 17 g by mouth daily.      Marland Kitchen spironolactone (ALDACTONE) 25 MG tablet TAKE ONE TABLET BY MOUTH EVERY DAY  31 tablet  5  . tiotropium (SPIRIVA HANDIHALER) 18 MCG inhalation capsule Place 1 capsule (18 mcg total) into inhaler and inhale daily.  30 capsule  4  . warfarin (COUMADIN) 5 MG tablet Take 5 mg by mouth every evening.       Fam HX:    Family History  Problem Relation Age of Onset  . Colon cancer Neg Hx    Social HX:    History   Social History  . Marital Status: Widowed    Spouse Name: N/A    Number of Children: N/A  . Years of Education: N/A   Occupational History  . Not on file.   Social History Main Topics  . Smoking status: Former Smoker -- 1.00 packs/day for 1.5 years    Types: Cigarettes  . Smokeless tobacco: Never Used  . Alcohol Use: No  . Drug Use: No  . Sexually Active: No   Other Topics Concern  . Not on file   Social History Narrative   She lives w/her son. Her son offers help and she has an aid who occasionally cooks for her and takes care of some other tasks around the pt's house.   Has medicare. Has son in [place] who is POA   Ambivalent about DNR issues and does not want a STOP sign posted in home           ROS:  All 11 ROS were addressed and are negative except what is stated in the HPI  Physical Exam: Blood pressure 153/64, pulse 58, temperature 97.5 F (36.4 C), temperature source Oral, resp. rate 19, height 5\' 1"   (1.549 m), weight 127.506 kg (281 lb 1.6 oz), last menstrual period 05/22/1968, SpO2 95.00%.    General: Well developed, well nourished, in no acute distress Head: Eyes PERRLA,  No xanthomas.   Normal cephalic and atramatic  Lungs:   Scattered expiratory wheezes throughout Heart:   HRRR S1 S2 Pulses are 2+ & equal.            No carotid bruit. No JVD.  No abdominal bruits. No femoral bruits. Abdomen: Bowel sounds are positive, abdomen soft and non-tender without masses  Extremities:   No clubbing, cyanosis or edema.  DP +1 Neuro: Alert and oriented X 3. Psych:  Good affect, responds appropriately    Labs:   Lab Results  Component Value Date   WBC 8.1 04/21/2012   HGB 12.1 04/21/2012   HCT 37.3 04/21/2012   MCV 95.2 04/21/2012   PLT 234 04/21/2012    Recent Labs Lab 04/21/12 0240  NA 141  K 5.1  CL 103  CO2 28  BUN 36*  CREATININE 1.30*  CALCIUM 9.9  PROT 6.9  BILITOT 0.5  ALKPHOS 126*  ALT 14  AST 17  GLUCOSE 132*   No results found for this basename: PTT   Lab Results  Component Value Date   INR 3.03* 04/20/2012   INR 2.50 04/06/2012   INR 1.4 03/31/2012   Lab Results  Component Value Date   CKTOTAL 66 05/08/2010   CKMB 2.9 05/08/2010   TROPONINI <0.30 04/21/2012     Lab Results  Component Value Date   CHOL 136 08/02/2011   CHOL  Value: 162        ATP III CLASSIFICATION:  <200     mg/dL   Desirable  161-096  mg/dL   Borderline High  >=045    mg/dL   High        06/03/8117   CHOL  Value: 133        ATP III CLASSIFICATION:  <200     mg/dL   Desirable  147-829  mg/dL   Borderline High  >=562    mg/dL   High        03/27/8655   Lab Results  Component Value Date   HDL 36* 08/02/2011   HDL 39* 05/08/2010   HDL 44 10/06/2008   Lab Results  Component Value Date   LDLCALC 62 08/02/2011   LDLCALC  Value: 94        Total Cholesterol/HDL:CHD Risk Coronary Heart Disease Risk Table                     Men   Women  1/2 Average Risk   3.4   3.3  Average Risk       5.0   4.4  2 X  Average Risk   9.6   7.1  3 X Average Risk  23.4   11.0        Use the calculated Patient Ratio above and the CHD Risk Table to determine the patient's CHD Risk.        ATP III CLASSIFICATION (LDL):  <100     mg/dL   Optimal  846-962  mg/dL   Near or Above                    Optimal  130-159  mg/dL   Borderline  952-841  mg/dL   High  >324     mg/dL   Very High 05/27/270   LDLCALC  Value: 71        Total Cholesterol/HDL:CHD Risk Coronary Heart Disease Risk Table  Men   Women  1/2 Average Risk   3.4   3.3  Average Risk       5.0   4.4  2 X Average Risk   9.6   7.1  3 X Average Risk  23.4   11.0        Use the calculated Patient Ratio above and the CHD Risk Table to determine the patient's CHD Risk.        ATP III CLASSIFICATION (LDL):  <100     mg/dL   Optimal  213-086  mg/dL   Near or Above                    Optimal  130-159  mg/dL   Borderline  578-469  mg/dL   High  >629     mg/dL   Very High 07/23/4130   Lab Results  Component Value Date   TRIG 192* 08/02/2011   TRIG 145 05/08/2010   TRIG 92 10/06/2008   Lab Results  Component Value Date   CHOLHDL 3.8 08/02/2011   CHOLHDL 4.2 05/08/2010   CHOLHDL 3.0 10/06/2008   No results found for this basename: LDLDIRECT      Radiology:  Dg Chest 2 View  04/20/2012  *RADIOLOGY REPORT*  Clinical Data: Chest pain and shortness of breath  CHEST - 2 VIEW  Comparison: 03/27/2011 and prior chest radiographs  Findings: Upper limits normal heart size and mild peribronchial thickening again noted. Elevation of the right hemidiaphragm is unchanged. There is no evidence of focal airspace disease, pulmonary edema, suspicious pulmonary nodule/mass, pleural effusion, or pneumothorax. No acute bony abnormalities are identified.  IMPRESSION: No evidence of acute cardiopulmonary disease.   Original Report Authenticated By: Harmon Pier, M.D.     EKG:  Atrial fibrillation with nonspecific IVCD, anterior and inferior  Q waves  ASSESSMENT:  1.  Acute on chronic  diastolic CHF with a history of normal LVF by echo 2008 - she recently had her Lasix dose decreased 2.  CAD with remote MI 1993 - cath 2006 with 30-40% left circ stenosis 3.  Chronic Respiratory failure with bronchospastic component and O2 dependent COPD 4.  Postablative hypothyroidism - with elevated TSH 5.  Dyslipidemia 6.  GErD 7.  History of PE /DVT with placement of Greenfield filter 1998 8.  Type II DM 9.  Chronic atrial fibrillation - currently not on rate controlling drugs due to bradycardia 10.  Systemic anticoagulation with therapeutic INR   PLAN:   1.  Replace thyroid hormone since this could be contributing to CHF exacerbation. 2.  Check 2D echo to reevaluate LVF 3.  Agree with continuing ARB and Lasix 4.  Continue ASA and statin for CAD 5.  Agree with holding beta blocker due to bradycardia 6.  Further treatment pending results of echo but for now will continue with increased dose of Lasix 7.  Ultimately should have some type of noninvasive workup for ischemia once CHF resolves   Quintella Reichert, MD  04/21/2012  4:00 PM

## 2012-04-21 NOTE — Progress Notes (Signed)
Echocardiogram 2D Echocardiogram has been performed.  [name] 04/21/2012, 4:52 PM

## 2012-04-21 NOTE — Progress Notes (Signed)
Notified Dr. Heloise Beecham re hr dropping to 33/min. Pt asymptomatic vs taken 139/50 HR=52 sat. 95% @ 2lnc. ekg done per MD. Continued to monitor pt closely

## 2012-04-21 NOTE — Progress Notes (Signed)
Notified Dr Loura Pardon regarding HR dropping down to low 40's and high 30's . Pt asymptomatic even verbalized "my heart rate goes down to low 40's.at home". No oders received from MD staed pt has been doing bradycardia in ED. Observed pt closely.

## 2012-04-21 NOTE — Progress Notes (Signed)
Patient's underline rhythm is A. Fib. Patient appears to be in A. Fib during my shift. Patient has not been in distress or complained of any discomfort. No morning strip to document, unable to access strip.

## 2012-04-21 NOTE — H&P (Signed)
Patient seen and examined, history reviewed with Dr. Heloise Beecham.  Patient with known COPD, DM, afib came in with SOB.  Recently had lasix dose decreased.  Also noted to be significantly bradycardic overnight, though remained asymptomatic.   BNP has been elevated. She will get some diuresis and evaluation by cardiology for the bradycardia, ? If she needs a pacemaker.

## 2012-04-21 NOTE — Progress Notes (Signed)
Utilization Review Completed.   Arpita Fentress, RN, BSN Nurse Case Manager  [phone]  

## 2012-04-21 NOTE — Consult Note (Signed)
ANTICOAGULATION CONSULT NOTE - Initial Consult  Pharmacy Consult for Coumadin Indication: atrial fibrillation, VTE prophylaxis   Allergies: Allergies  Allergen Reactions  . Nitrofurantoin (Macrodantin) Itching    Height/Weight: Height: 5\' 1"  (154.9 cm) Weight: 281 lb 1.6 oz (127.506 kg) IBW/kg (Calculated) : 47.8  Vital Signs: BP 153/64  Pulse 58  Temp(Src) 97.5 F (36.4 C) (Oral)  Resp 19  Ht 5\' 1"  (1.549 m)  Wt 281 lb 1.6 oz (127.506 kg)  BMI 53.14 kg/m2  SpO2 95%  LMP 05/22/1968  Active Problems: Principal Problem:   Acute on chronic diastolic CHF (congestive heart failure) Active Problems:   HYPOTHYROIDISM   DIABETES MELLITUS   HYPERLIPIDEMIA   DEPRESSION   HYPERTENSION   Atrial fibrillation   CKD (chronic kidney disease) stage 3, GFR 30-59 ml/min   CAD (coronary artery disease)   Labs:  Recent Labs  04/20/12 1730 04/20/12 2112 04/21/12 0240 04/21/12 1856  HGB 13.3  --  12.1  --   HCT 40.1  --  37.3  --   PLT 255  --  234  --   CREATININE 1.18*  --  1.30*  --    Lab Results  Component Value Date   INR 2.15* 04/21/2012   INR 3.03* 04/20/2012   INR 2.50 04/06/2012   Estimated Creatinine Clearance: 45.6 ml/min (by C-G formula based on Cr of 1.3).  Medical / Surgical History: Past Medical History  Diagnosis Date  . CAD (coronary artery disease)     s/p remote MI (1993)  . Abdominal pain     recent admission, likely secondary to presbyesophagus and gastric dysmotility  . CVD (cardiovascular disease)   . Chronic diastolic heart failure     with dilated cardiomyopathy, last EF 55%(09/2006)  . Respiratory failure, chronic     mixed etiology with bronchospastic component  . Urinary incontinence     recurrent uti's/resistance to cipro, bactrim  . Gout   . Postablative hypothyroidism     H/o Graves disease s/p radioactive iodine ablation with resultant postablative hypothyroidism  . Hyperlipidemia   . Depression   . Morbidly obese     s/p  gastric plication surgery  . Gastroesophageal reflux disease   . Ventral hernia     repair in April 2008, complicated by MRSA abdominal wall cellulitis  . DVT (deep venous thrombosis) 1998  . Pulmonary embolism 1998    1998, s/p Greenfield filter  . Diabetes mellitus type 2, controlled, with complications   . Hypertension   . History of recurrent UTIs     and history of pyelonephritis  . AF (atrial fibrillation)     on chronic warfarin  . Osteoarthritis   . Myocardial infarction   . Stroke 1997    denies residual  . Anemia     Blood transfusion [V58.2]   Past Surgical History  Procedure Laterality Date  . Cholecystectomy    . Appendectomy    . Breast biopsy    . Gastric bypass  1970's  . Hernia repair  05/2006    ventral hernia  . Greenfield filter placement  1998    Medications:  Prescriptions prior to admission  Medication Sig Dispense Refill  . acetaminophen (TYLENOL) 500 MG tablet Take 1,000 mg by mouth 2 (two) times daily as needed. For pain.      Marland Kitchen albuterol (PROVENTIL HFA) 108 (90 BASE) MCG/ACT inhaler Inhale 2 puffs into the lungs every 6 (six) hours as needed. For wheezing.  3.7 g  11  .  amLODipine (NORVASC) 5 MG tablet Take 5 mg by mouth daily.      Marland Kitchen atorvastatin (LIPITOR) 40 MG tablet TAKE ONE TABLET BY MOUTH EVERY DAY  30 tablet  4  . colchicine (COLCRYS) 0.6 MG tablet Take 1 tablet (0.6 mg total) by mouth every other day.  30 tablet  0  . FLUoxetine (PROZAC) 20 MG capsule Take 20 mg by mouth 2 (two) times daily.      . Fluticasone-Salmeterol (ADVAIR) 250-50 MCG/DOSE AEPB Inhale 1 puff into the lungs every 12 (twelve) hours.  60 each  3  . furosemide (LASIX) 40 MG tablet Take 20-40 mg by mouth 2 (two) times daily. 40 mg every morning and 20 mg at bedtime      . gabapentin (NEURONTIN) 100 MG capsule TAKE TWO CAPSULES BY MOUTH TWICE DAILY  120 capsule  2  . glipiZIDE (GLUCOTROL) 10 MG tablet Take 10 mg by mouth 2 (two) times daily before a meal.      .  levothyroxine (SYNTHROID) 150 MCG tablet Take 1 tablet (150 mcg total) by mouth daily.  30 tablet  11  . losartan (COZAAR) 50 MG tablet TAKE ONE TABLET BY MOUTH TWICE DAILY  60 tablet  4  . montelukast (SINGULAIR) 10 MG tablet TAKE ONE TABLET BY MOUTH EVERY DAY  30 tablet  6  . omeprazole (PRILOSEC) 20 MG capsule Take 40 mg by mouth 2 (two) times daily.      . polyethylene glycol (MIRALAX / GLYCOLAX) packet Take 17 g by mouth daily.      Marland Kitchen spironolactone (ALDACTONE) 25 MG tablet TAKE ONE TABLET BY MOUTH EVERY DAY  31 tablet  5  . tiotropium (SPIRIVA HANDIHALER) 18 MCG inhalation capsule Place 1 capsule (18 mcg total) into inhaler and inhale daily.  30 capsule  4  . warfarin (COUMADIN) 5 MG tablet Take 5 mg by mouth every evening.        Assessment:  79 y.o.female with Acute on chronic diastolic CHF (congestive heart failure) who will be restarted on Coumadin for VTE prophylaxis   Patient did not receive any Coumadin yesterday.  Her INR today is 2.15.  Goal of Therapy:   INR 2-3      Plan:   Resume Coumadin 5 mg daily. Daily INR's, CBC.  Monitor for bleeding complications  Laurena Bering,  Pharm.D.. 04/21/2012,  8:27 PM

## 2012-04-21 NOTE — Progress Notes (Signed)
Subjective: [name] was seen and examined at bedside.  She was eating her food comfortably and without any complaints.  She endorses improvement in her breathing and cough and feeling better since admission.  She denies any chest pain, N/V/D, fever, chills, abdominal pain, or any urinary complaints at this time.   Objective: Vital signs in last 24 hours: Filed Vitals:   04/21/12 1321 04/21/12 1332 04/21/12 1548 04/21/12 1556  BP: 84/41 176/69 128/79 153/64  Pulse: 61 62 53 58  Temp: 97.5 F (36.4 C)     TempSrc: Oral     Resp: 19     Height:      Weight:      SpO2: 95%      Weight change:   Intake/Output Summary (Last 24 hours) at 04/21/12 1732 Last data filed at 04/21/12 1230  Gross per 24 hour  Intake   1203 ml  Output      0 ml  Net   1203 ml   Vitals reviewed. General: resting in bed, NAD HEENT: PERRLA, EOMI, no scleral icterus Cardiac: RRR, no rubs, murmurs or gallops Pulm: mild scattered expiratory wheezing Abd: soft, nontender, nondistended, BS present Ext: warm and well perfused, no pedal edema, +1 b/l lower extremity pitting edema, +2dp b/l Neuro: alert and oriented X3, cranial nerves II-XII grossly intact, strength and sensation to light touch equal in bilateral upper and lower extremities  Lab Results: Basic Metabolic Panel:  Recent Labs Lab 04/20/12 1730 04/20/12 2112 04/21/12 0240  NA 135  --  141  K 5.4*  --  5.1  CL 98  --  103  CO2 26  --  28  GLUCOSE 170*  --  132*  BUN 35*  --  36*  CREATININE 1.18*  --  1.30*  CALCIUM 10.4  --  9.9  MG  --  2.0  --   PHOS  --  2.9  --    Liver Function Tests:  Recent Labs Lab 04/20/12 2112 04/21/12 0240  AST 17 17  ALT 15 14  ALKPHOS 134* 126*  BILITOT 0.5 0.5  PROT 7.2 6.9  ALBUMIN 3.2* 3.0*   CBC:  Recent Labs Lab 04/20/12 1730 04/21/12 0240  WBC 9.9 8.1  HGB 13.3 12.1  HCT 40.1 37.3  MCV 94.8 95.2  PLT 255 234   Cardiac Enzymes:  Recent Labs Lab 04/20/12 2112 04/21/12 0240  04/21/12 0840  TROPONINI <0.30 <0.30 <0.30   BNP:  Recent Labs Lab 04/20/12 1730  PROBNP 2371.0*   CBG:  Recent Labs Lab 04/21/12 0623 04/21/12 1107 04/21/12 1601  GLUCAP 129* 169* 158*   Thyroid Function Tests:  Recent Labs Lab 04/20/12 2112  TSH 1.277   Coagulation:  Recent Labs Lab 04/20/12 2112  LABPROT 29.8*  INR 3.03*   Micro Results: Recent Results (from the past 240 hour(s))  MRSA PCR SCREENING     Status: None   Collection Time    04/20/12 11:23 PM      Result Value Range Status   MRSA by PCR NEGATIVE  NEGATIVE Final   Comment:            The GeneXpert MRSA Assay (FDA     approved for NASAL specimens     only), is one component of a     comprehensive MRSA colonization     surveillance program. It is not     intended to diagnose MRSA     infection nor to guide or  monitor treatment for     MRSA infections.   Studies/Results: Dg Chest 2 View  04/20/2012  *RADIOLOGY REPORT*  Clinical Data: Chest pain and shortness of breath  CHEST - 2 VIEW  Comparison: 03/27/2011 and prior chest radiographs  Findings: Upper limits normal heart size and mild peribronchial thickening again noted. Elevation of the right hemidiaphragm is unchanged. There is no evidence of focal airspace disease, pulmonary edema, suspicious pulmonary nodule/mass, pleural effusion, or pneumothorax. No acute bony abnormalities are identified.  IMPRESSION: No evidence of acute cardiopulmonary disease.   Original Report Authenticated By: Harmon Pier, M.D.    Medications: I have reviewed the patient's current medications. Scheduled Meds: . aspirin EC  81 mg Oral Daily  . atorvastatin  40 mg Oral q1800  . colchicine  0.6 mg Oral QODAY  . FLUoxetine  20 mg Oral BID  . furosemide  60 mg Oral BID  . gabapentin  200 mg Oral BID  . insulin aspart  0-9 Units Subcutaneous TID WC  . levothyroxine  150 mcg Oral QAC breakfast  . mometasone-formoterol  2 puff Inhalation BID  . montelukast  10 mg  Oral QHS  . pantoprazole  40 mg Oral Daily  . polyethylene glycol  17 g Oral Daily  . sodium chloride  3 mL Intravenous Q12H  . tiotropium  18 mcg Inhalation Daily   Continuous Infusions:  PRN Meds:.acetaminophen, albuterol, perflutren lipid microspheres (DEFINITY) IV suspension Assessment/Plan: [name] is a 79 y.o. female with a PMHx of oxygen dependent COPD (on 2L [place]), controlled DM2, atrial fibrillation on chronic coumadin, CAD (prior MI 20 years ago), CVD (without residual deficits) admitted on 04/20/2012 with symptoms of chest tightness and shortness of breath likely secondary to CHF exacerbation.   1) Dyspnea--likely secondary to CHF exacerbation.  Presented with complaints of worsening shortness of breath.  Has interval increase of pro-BNP 2371 and 1+ bilateral pitting edema in the setting of recent decrease in daily lasix dose.  No pulmonary edema on CXR or rales on exam. Oxygenating well at home O2 flow rate. She has a documented history of prior CM with EF 40%, most recent echo shows EF 55% from 2008.  She has diagnosis of diastolic dysfunction, although this was not specifically commented on at her last echo.  She also has COPD, but presentation is not consistent with COPD exacerbation at this time. Cough is chronic and unchanged, no increased production with mild wheezing sound is transmitted from upper airway.   - cycle CE x3 negative - f/u Echo  - continue lasix 60mg  BID  - Cotinue losartan  - daily weights, strict i/o's - trend renal function  2) Atrial fibrillation--on chronic warfarin for atrial fibrillation. She had previously taken diltiazem for rate control, now not on any rate-controlling medications in setting of bradycardia (HR 50s). Bradycardia in absence of nodal blocking agent suggests poorer compensation for atrial fibrillation.  Appeared to be in sinus rhythm on admission. INR is 3. Follows w Dr. Alexandria [name] as outpatient.  - Continue warfarin  - monitor INR - Consult  cardiology--Dr. Mayford [name]: agree with echo to re-evaluate LVF, replace thyroid hormone, continue ARB and Lasix and ASA and Statin and hold BB. Ultimately will need non-invasive ischemic work up once CHF resolves.   3) CAD, nonobstructive--history of prior MI. In 2006, cath was performed which showed 30-40% stenosis of circumflex, no interventions. Not on a beta blocker due to bradycardia. Unclear why not on ASA therapy now, does not have ASA allergy.  -  Continue statin  - ASA 81mg    4) Chronic lung disease, obstructive/restrictive pattern of lung disease. Currently on albulterol inhaler, advair, spiriva, and singulair at home. Does not appear to have acute exacerbation at this time.  - nebulizers prn -continue advair inhaler   5) Hypothyroidism--Symptoms could represent hypothyroidism, but her synthroid dose was recently increased on 03/31/12 from 125 to 150 mcg daily, making this less likely.  - TSH 1.277 - continue synthroid qd  6) HTN-- BP on admission 115/48.  On Norvasc 5mg , Lasix 40mg  in am and 20mg  in pm, and spironolactone 25mg  at home. - Continue ARB, and increased dose of lasix of 60mg  bid - Hold amlodipine, spironolactone   7) Diabetes--Last HbA1c 7.0 earlier this month. On glipizide 10mg  daily at home, although per chart she should be taking twice daily.  - Hold glipizide - SSI while inpatient   8) Gout--Clinically stable. Uric acid not at goal (9.8 as of 03/31/12). Will not make adjustments while inpatient.  - Continue colchicine   9) Depression--appears stable at this time  - Continue fluoxetine   10) GERD--Stable at this time  - Continue omeprazole   11) Hyperkalemia--Potassium 5.4 on admission, no EKG changes. Decreased with diuresis, holding spironolactone.  - Continue to monitor   12) CKD--baseline Cr ~1.2.  Cr 1.18 on admission, increased to 1.3 likely secondary to increase in diuresis. -Trend BMET  DVT PPX - warfarin  CODE STATUS - FULL CONSULTS PLACED -  none DISPO - Disposition is deferred at this time, awaiting improvement of current symptoms  Anticipated discharge in approximately 1-2 day(s).  The patient does have a current PCP (BHARDWAJ, SHILPA, MD) and does need an [facility] hospital follow-up appointment after discharge.  Is the [facility] hospital follow-up appointment a one-time only appointment? not applicable.  Does the patient have transportation limitations that hinder transportation to clinic appointments? unknown.  Services Needed at time of discharge: Y = Yes, Blank = No PT:   OT:   RN:   Equipment:   Other:     LOS: 1 day   [name] 04/21/2012, 5:32 PM

## 2012-04-22 ENCOUNTER — Other Ambulatory Visit: Payer: Self-pay | Admitting: Internal Medicine

## 2012-04-22 DIAGNOSIS — N189 Chronic kidney disease, unspecified: Secondary | ICD-10-CM

## 2012-04-22 LAB — BASIC METABOLIC PANEL
CO2: 26 mEq/L (ref 19–32)
Glucose, Bld: 171 mg/dL — ABNORMAL HIGH (ref 70–99)
Potassium: 4.5 mEq/L (ref 3.5–5.1)
Sodium: 139 mEq/L (ref 135–145)

## 2012-04-22 LAB — GLUCOSE, CAPILLARY
Glucose-Capillary: 167 mg/dL — ABNORMAL HIGH (ref 70–99)
Glucose-Capillary: 141 mg/dL — ABNORMAL HIGH (ref 70–99)
Glucose-Capillary: 132 mg/dL — ABNORMAL HIGH (ref 70–99)

## 2012-04-22 MED ORDER — WARFARIN SODIUM 5 MG PO TABS
5.0000 mg | ORAL_TABLET | Freq: Every day | ORAL | Status: AC
  Administered 2012-04-22: 5 mg via ORAL
  Filled 2012-04-22: qty 1

## 2012-04-22 MED ORDER — AMLODIPINE BESYLATE 5 MG PO TABS
5.0000 mg | ORAL_TABLET | Freq: Every day | ORAL | Status: DC
  Administered 2012-04-22: 5 mg via ORAL
  Filled 2012-04-22 (×3): qty 1

## 2012-04-22 NOTE — Progress Notes (Signed)
SUBJECTIVE:  No complaints this am  OBJECTIVE:   Vitals:   Filed Vitals:   04/21/12 1556 04/21/12 2109 04/21/12 2126 04/22/12 0500  BP: 153/64 155/64  121/64  Pulse: 58 61  62  Temp:  98.1 F (36.7 C)  97.7 F (36.5 C)  TempSrc:  Oral  Oral  Resp:  19  18  Height:      Weight:    127.098 kg (280 lb 3.2 oz)  SpO2:  92% 93% 92%   I&O's:   Intake/Output Summary (Last 24 hours) at 04/22/12 0825 Last data filed at 04/21/12 1801  Gross per 24 hour  Intake    603 ml  Output      0 ml  Net    603 ml   TELEMETRY: Reviewed telemetry pt in NSR:     PHYSICAL EXAM General: Well developed, well nourished, in no acute distress Head: Eyes PERRLA, No xanthomas.   Normal cephalic and atramatic  Lungs:   Clear bilaterally to auscultation and percussion. Heart:   HRRR S1 S2 Pulses are 2+ & equal. Abdomen: Bowel sounds are positive, abdomen soft and non-tender without masses Extremities:   No clubbing, cyanosis or edema.  DP +1 Neuro: Alert and oriented X 3. Psych:  Good affect, responds appropriately   LABS: Basic Metabolic Panel:  Recent Labs  40/98/11 1730 04/20/12 2112 04/21/12 0240  NA 135  --  141  K 5.4*  --  5.1  CL 98  --  103  CO2 26  --  28  GLUCOSE 170*  --  132*  BUN 35*  --  36*  CREATININE 1.18*  --  1.30*  CALCIUM 10.4  --  9.9  MG  --  2.0  --   PHOS  --  2.9  --    Liver Function Tests:  Recent Labs  04/20/12 2112 04/21/12 0240  AST 17 17  ALT 15 14  ALKPHOS 134* 126*  BILITOT 0.5 0.5  PROT 7.2 6.9  ALBUMIN 3.2* 3.0*   No results found for this basename: LIPASE, AMYLASE,  in the last 72 hours CBC:  Recent Labs  04/20/12 1730 04/21/12 0240  WBC 9.9 8.1  HGB 13.3 12.1  HCT 40.1 37.3  MCV 94.8 95.2  PLT 255 234   Cardiac Enzymes:  Recent Labs  04/20/12 2112 04/21/12 0240 04/21/12 0840  TROPONINI <0.30 <0.30 <0.30   Thyroid Function Tests:  Recent Labs  04/20/12 2112  TSH 1.277   Anemia Panel: No results found for this  basename: VITAMINB12, FOLATE, FERRITIN, TIBC, IRON, RETICCTPCT,  in the last 72 hours Coag Panel:   Lab Results  Component Value Date   INR 2.15* 04/21/2012   INR 3.03* 04/20/2012   INR 2.50 04/06/2012    RADIOLOGY: Dg Chest 2 View  04/20/2012  *RADIOLOGY REPORT*  Clinical Data: Chest pain and shortness of breath  CHEST - 2 VIEW  Comparison: 03/27/2011 and prior chest radiographs  Findings: Upper limits normal heart size and mild peribronchial thickening again noted. Elevation of the right hemidiaphragm is unchanged. There is no evidence of focal airspace disease, pulmonary edema, suspicious pulmonary nodule/mass, pleural effusion, or pneumothorax. No acute bony abnormalities are identified.  IMPRESSION: No evidence of acute cardiopulmonary disease.   Original Report Authenticated By: Harmon Pier, M.D.     ASSESSMENT:  1. Acute on chronic diastolic CHF with a history of normal LVF by echo 2008 - she recently had her Lasix dose decreased - appears euvolemic on  exam today 2. CAD with remote MI 1993 - cath 2006 with 30-40% left circ stenosis  3. Chronic Respiratory failure with bronchospastic component and O2 dependent COPD  4. Postablative hypothyroidism - with elevated TSH  5. Dyslipidemia  6. GErD  7. History of PE /DVT with placement of [place] filter 1998  8. Type II DM  9. Chronic atrial fibrillation - currently not on rate controlling drugs due to bradycardia  10. Systemic anticoagulation with therapeutic INR  PLAN:  1. Replace thyroid hormone since this could be contributing to CHF exacerbation.  2. Check 2D echo to reevaluate LVF  3. Agree with continuing ARB and Lasix  4. Continue ASA and statin for CAD  5. Agree with holding beta blocker due to bradycardia  6. Further treatment pending results of echo but for now will continue with increased dose of Lasix  7. Ultimately should have some type of noninvasive workup for ischemia once CHF resolves       Quintella Reichert, MD   04/22/2012  8:25 AM

## 2012-04-22 NOTE — Progress Notes (Addendum)
Subjective: [name] was seen and examined at bedside.  She was lying completely flat and sleeping comfortably.  She endorses improvement in her breathing and cough since admission.  She denies any chest pain, N/V/D, fever, chills, abdominal pain, or any urinary complaints at this time. Down 1 pound since admission.  Difficult to measure correct output as she is incontinent.   Objective: Vital signs in last 24 hours: Filed Vitals:   04/21/12 2126 04/22/12 0500 04/22/12 1022 04/22/12 1054  BP:  121/64 164/69   Pulse:  62 68   Temp:  97.7 F (36.5 C)    TempSrc:  Oral    Resp:  18    Height:      Weight:  280 lb 3.2 oz (127.098 kg)    SpO2: 93% 92%  95%   Weight change: -14.4 oz (-0.408 kg)  Intake/Output Summary (Last 24 hours) at 04/22/12 1116 Last data filed at 04/22/12 1022  Gross per 24 hour  Intake    963 ml  Output      0 ml  Net    963 ml   Vitals reviewed. General: resting in bed, NAD HEENT: PERRLA, EOMI, no scleral icterus Cardiac: RRR, no rubs, murmurs or gallops Pulm: mild scattered expiratory wheezing Abd: soft, nontender, nondistended, BS present Ext: warm and well perfused, no pedal edema, +1 b/l lower extremity pitting edema, +2dp b/l Neuro: alert and oriented X3, cranial nerves II-XII grossly intact, strength and sensation to light touch equal in bilateral upper and lower extremities  Lab Results: Basic Metabolic Panel:  Recent Labs Lab 04/20/12 2112 04/21/12 0240 04/22/12 0830  NA  --  141 139  K  --  5.1 4.5  CL  --  103 101  CO2  --  28 26  GLUCOSE  --  132* 171*  BUN  --  36* 45*  CREATININE  --  1.30* 1.39*  CALCIUM  --  9.9 10.1  MG 2.0  --   --   PHOS 2.9  --   --    Liver Function Tests:  Recent Labs Lab 04/20/12 2112 04/21/12 0240  AST 17 17  ALT 15 14  ALKPHOS 134* 126*  BILITOT 0.5 0.5  PROT 7.2 6.9  ALBUMIN 3.2* 3.0*   CBC:  Recent Labs Lab 04/20/12 1730 04/21/12 0240  WBC 9.9 8.1  HGB 13.3 12.1  HCT 40.1 37.3   MCV 94.8 95.2  PLT 255 234   Cardiac Enzymes:  Recent Labs Lab 04/20/12 2112 04/21/12 0240 04/21/12 0840  TROPONINI <0.30 <0.30 <0.30   BNP:  Recent Labs Lab 04/20/12 1730  PROBNP 2371.0*   CBG:  Recent Labs Lab 04/21/12 0623 04/21/12 1107 04/21/12 1601 04/21/12 2107 04/22/12 0648  GLUCAP 129* 169* 158* 127* 175*   Thyroid Function Tests:  Recent Labs Lab 04/20/12 2112  TSH 1.277   Coagulation:  Recent Labs Lab 04/20/12 2112 04/21/12 1856  LABPROT 29.8* 23.1*  INR 3.03* 2.15*   Micro Results: Recent Results (from the past 240 hour(s))  MRSA PCR SCREENING     Status: None   Collection Time    04/20/12 11:23 PM      Result Value Range Status   MRSA by PCR NEGATIVE  NEGATIVE Final   Comment:            The GeneXpert MRSA Assay (FDA     approved for NASAL specimens     only), is one component of a     comprehensive  MRSA colonization     surveillance program. It is not     intended to diagnose MRSA     infection nor to guide or     monitor treatment for     MRSA infections.   Studies/Results: Dg Chest 2 View  04/20/2012  *RADIOLOGY REPORT*  Clinical Data: Chest pain and shortness of breath  CHEST - 2 VIEW  Comparison: 03/27/2011 and prior chest radiographs  Findings: Upper limits normal heart size and mild peribronchial thickening again noted. Elevation of the right hemidiaphragm is unchanged. There is no evidence of focal airspace disease, pulmonary edema, suspicious pulmonary nodule/mass, pleural effusion, or pneumothorax. No acute bony abnormalities are identified.  IMPRESSION: No evidence of acute cardiopulmonary disease.   Original Report Authenticated By: Harmon Pier, M.D.    Medications: I have reviewed the patient's current medications. Scheduled Meds: . aspirin EC  81 mg Oral Daily  . atorvastatin  40 mg Oral q1800  . colchicine  0.6 mg Oral QODAY  . FLUoxetine  20 mg Oral BID  . furosemide  60 mg Oral BID  . gabapentin  200 mg Oral BID   . insulin aspart  0-9 Units Subcutaneous TID WC  . levothyroxine  150 mcg Oral QAC breakfast  . mometasone-formoterol  2 puff Inhalation BID  . montelukast  10 mg Oral QHS  . pantoprazole  40 mg Oral Daily  . polyethylene glycol  17 g Oral Daily  . sodium chloride  3 mL Intravenous Q12H  . tiotropium  18 mcg Inhalation Daily  . warfarin  5 mg Oral q1800  . Warfarin - Pharmacist Dosing Inpatient   Does not apply q1800   Continuous Infusions:  PRN Meds:.acetaminophen, albuterol Assessment/Plan: [name] is a 79 y.o. female with a PMHx of oxygen dependent COPD (on 2L [place]), controlled DM2, atrial fibrillation on chronic coumadin, CAD (prior MI 20 years ago), CVD (without residual deficits) admitted on 04/20/2012 with symptoms of chest tightness and shortness of breath likely secondary to CHF exacerbation.   1) Dyspnea--likely secondary to CHF exacerbation.  Presented with complaints of worsening shortness of breath.  Has interval increase of pro-BNP 2371 and 1+ bilateral pitting edema in the setting of recent decrease in daily lasix dose.  No pulmonary edema on CXR or rales on exam. Oxygenating well at home O2 flow rate. She has a documented history of prior CM with EF 40%, most recent echo shows EF 55% from 2008.  She has diagnosis of diastolic dysfunction, although this was not specifically commented on at her last echo.  She also has COPD, but presentation is not consistent with COPD exacerbation at this time. Cough is chronic and unchanged, no increased production with mild wheezing sound is transmitted from upper airway.    Discussed in detail with Dr. Anne Fu from cardiology--okay with increased dose of lasix but will require close follow up to monitor cr and cardiology follow up in 2 weeks.  Restart home norvasc but continue to hold spironolactone.     - cycle CE x3 negative - f/u Echo 2/25: mild focal basal hypertrophy of septum with estimated EF 55%.  LV diastolic function normal.   Severe diffuse thickening and calcification of aortic valve with moderate stenosis, mild regurgitation, and valve area 0.57cm2.  Vmax 0.21cm2.   - continue lasix 60mg  BID  - Cotinue losartan  - daily weights, strict i/o's - trend renal function - PT--recommends SNF, however patient is not in agreement.  I discussed with her son as  well who was present in the room today who is her primary caretaker at home, he informed me that she has a nursing aid that comes to the house every morning but he would like her walking more.  I then talked to her other son on the phone, Ethelene Browns, who is power of attorney, and he said he would try to get the aide to come more often or also try to get someone at home who can work with her for physical therapy and more supervision.  In the meantime, they are requesting home health aide and pt assistance and they will continue to discuss more supervised care with [name] as well.  -will place order for home health assistance at this time  2) Atrial fibrillation--on chronic warfarin for atrial fibrillation. She had previously taken diltiazem for rate control, now not on any rate-controlling medications in setting of bradycardia (HR 50s). Bradycardia in absence of nodal blocking agent suggests poorer compensation for atrial fibrillation.  Appeared to be in sinus rhythm on admission. INR is 3. Follows w Dr. Alexandria Lodge as outpatient.  Discussed with Dr. Dione Housekeeper from cardiology in detail,  - Continue warfarin  - monitor INR - appreciate cardiology input--Dr. Mayford Knife following: continue ARB and Lasix and ASA and Statin and hold BB. Ultimately will need non-invasive ischemic work up once CHF resolves. Follow up 2 weeks with cardiology.  3) CAD, nonobstructive--history of prior MI. In 2006, cath was performed which showed 30-40% stenosis of circumflex, no interventions. Not on a beta blocker due to bradycardia.  - Continue statin  - ASA 81mg   - cardiology following  4) Chronic lung  disease, obstructive/restrictive pattern of lung disease. Currently on albulterol inhaler, advair, spiriva, and singulair at home. Does not appear to have acute exacerbation at this time.  - nebulizers prn - continue advair inhaler   5) Hypothyroidism--Symptoms could represent hypothyroidism, but her synthroid dose was recently increased on 03/31/12 from 125 to 150 mcg daily, making this less likely.  - TSH 1.277 - continue synthroid qd  6) HTN-- BP on admission 115/48.  On Norvasc 5mg , Lasix 40mg  in am and 20mg  in pm, and spironolactone 25mg  at home. - Continue ARB, and increased dose of lasix of 60mg  bid - Restart amlodipine - Continue to hold spironolactone   7) Diabetes--Last HbA1c 7.0 earlier this month. On glipizide 10mg  daily at home, although per chart she should be taking twice daily.  - Hold glipizide - SSI while inpatient   8) Gout--Clinically stable. Uric acid not at goal (9.8 as of 03/31/12). Will not make adjustments while inpatient.  - Continue colchicine   9) Depression--appears stable at this time  - Continue fluoxetine   10) GERD--Stable at this time  - Continue omeprazole   11) Hyperkalemia--Potassium 5.4 on admission, no EKG changes. Decreased with diuresis, holding spironolactone.  - Continue to monitor   12) CKD--baseline Cr ~1.2.  Cr 1.18 on admission, increasing with diuresis -Trend BMET -f/u Cr closely outpatient  13) Diarrhea--new onset, possibly secondary to miralax.   -will d/c miralax -continue to observe  DVT PPX - warfarin  CODE STATUS - FULL CONSULTS PLACED - none DISPO - likely d/c today or tomorrow The patient does have a current PCP (BHARDWAJ, SHILPA, MD) and does need an [facility] hospital follow-up appointment after discharge.  Is the [facility] hospital follow-up appointment a one-time only appointment? not applicable.  Does the patient have transportation limitations that hinder transportation to clinic appointments? unknown.  Services Needed  at time of discharge: Y = Yes, Blank = No PT: Recommends SNF, pt does not agree, will proceed with home health aid and PT if possible  OT:   RN: Home health  Equipment:   Other:     LOS: 2 days   Darden Palmer 04/22/2012, 11:16 AM

## 2012-04-22 NOTE — Progress Notes (Signed)
ANTICOAGULATION CONSULT NOTE - Follow Up Consult  Pharmacy Consult for coumadin Indication: atrial fibrillation  Allergies  Allergen Reactions  . Nitrofurantoin (Macrodantin) Itching    Patient Measurements: Height: 5\' 1"  (154.9 cm) Weight: 280 lb 3.2 oz (127.098 kg) (scale c) IBW/kg (Calculated) : 47.8  Vital Signs: Temp: 97.7 F (36.5 C) (02/26 0500) Temp src: Oral (02/26 0500) BP: 121/64 mmHg (02/26 0500) Pulse Rate: 62 (02/26 0500)  Labs:  Recent Labs  04/20/12 1730 04/20/12 2112 04/21/12 0240 04/21/12 0840 04/21/12 1856  HGB 13.3  --  12.1  --   --   HCT 40.1  --  37.3  --   --   PLT 255  --  234  --   --   LABPROT  --  29.8*  --   --  23.1*  INR  --  3.03*  --   --  2.15*  CREATININE 1.18*  --  1.30*  --   --   TROPONINI  --  <0.30 <0.30 <0.30  --     Estimated Creatinine Clearance: 45.5 ml/min (by C-G formula based on Cr of 1.3).   Medications:  Scheduled:  . aspirin EC  81 mg Oral Daily  . atorvastatin  40 mg Oral q1800  . colchicine  0.6 mg Oral QODAY  . FLUoxetine  20 mg Oral BID  . furosemide  60 mg Oral BID  . gabapentin  200 mg Oral BID  . insulin aspart  0-9 Units Subcutaneous TID WC  . levothyroxine  150 mcg Oral QAC breakfast  . mometasone-formoterol  2 puff Inhalation BID  . montelukast  10 mg Oral QHS  . pantoprazole  40 mg Oral Daily  . polyethylene glycol  17 g Oral Daily  . sodium chloride  3 mL Intravenous Q12H  . tiotropium  18 mcg Inhalation Daily  . [COMPLETED] warfarin  5 mg Oral q1800  . Warfarin - Pharmacist Dosing Inpatient   Does not apply q1800    Assessment: 79 yo female here with HF exacerbation on coumadin PTA. INR last pm was 2.15 on home dose of 5mg /day.   Goal of Therapy:  INR 2-3 Monitor platelets by anticoagulation protocol: Yes   Plan:  -Continue coumadin 5mg /day for now  -Daily PT/INR beginning 04/23/12  Harland German, Pharm D 04/22/2012 8:53 AM   e

## 2012-04-22 NOTE — Evaluation (Signed)
Physical Therapy Evaluation Patient Details Name: MELITA VILLALONA MRN: [id number] DOB: 11/07/1932 Today's Date: 04/22/2012 Time: 5621-3086 PT Time Calculation (min): 42 min  PT Assessment / Plan / Recommendation Clinical Impression  79 y/o female adm. for sob in the setting of chf. PTA pt modified independent with most ADLs and gait. Has an aide 6 days/wk for 4 hours in the morning. This aide helps her with cooking/cleaning and some bathing. Pt presents to PT today with decreased functional independence. She was barely able to stand x3 needing mod-minA for sit<>stand. Unable to walk today due to incontinence and weakness. Given her limited support at home and how much assist she needed today with very minimal activity I do not feel she would be able to manage alone at home, even with her aide in the morning. I would recommend SNF prior to d/c home to maximize endurance and safety as well as functional independence. Unsure if she will agree but I encouraged her to think about it. PT to follow acutely to progress activity tolerance.     PT Assessment  Patient needs continued PT services    Follow Up Recommendations  SNF    Does the patient have the potential to tolerate intense rehabilitation      Barriers to Discharge        Equipment Recommendations  None recommended by PT    Recommendations for Other Services     Frequency Min 3X/week    Precautions / Restrictions Precautions Precautions: Fall Precaution Comments: watch sats Restrictions Weight Bearing Restrictions: No   Pertinent Vitals/Pain SaO2 was 87% initially pt sitting up in the chair (on 2 liters); with standing activities she fluctuated between low 80s-94% on 2 liters       Mobility  Bed Mobility Bed Mobility: Not assessed Transfers Transfers: Sit to Stand;Stand to Sit Sit to Stand: 3: Mod assist;With upper extremity assist;From chair/3-in-1;With armrests Stand to Sit: 4: Min assist;With upper extremity  assist;To chair/3-in-1;With armrests Details for Transfer Assistance: mod facilitation for anterior translation of trunk over BOS and assist for power up; pt attempted to stand by herself x2 without success  Ambulation/Gait Ambulation/Gait Assistance: Not tested (comment)         PT Diagnosis: Difficulty walking;Abnormality of gait;Generalized weakness;Acute pain  PT Problem List: Decreased strength;Decreased activity tolerance;Decreased balance;Decreased mobility;Decreased knowledge of use of DME;Decreased safety awareness;Cardiopulmonary status limiting activity PT Treatment Interventions: DME instruction;Gait training;Stair training;Functional mobility training;Therapeutic activities;Therapeutic exercise;Balance training;Neuromuscular re-education;Patient/family education   PT Goals Acute Rehab PT Goals PT Goal Formulation: With patient Time For Goal Achievement: 04/29/12 Potential to Achieve Goals: Good Pt will go Supine/Side to Sit: with modified independence PT Goal: Supine/Side to Sit - Progress: Goal set today Pt will go Sit to Supine/Side: with modified independence PT Goal: Sit to Supine/Side - Progress: Goal set today Pt will go Sit to Stand: with modified independence PT Goal: Sit to Stand - Progress: Goal set today Pt will go Stand to Sit: with modified independence PT Goal: Stand to Sit - Progress: Goal set today Pt will Stand: with modified independence;3 - 5 min;with bilateral upper extremity support PT Goal: Stand - Progress: Goal set today Pt will Ambulate: 1 - 15 feet;with modified independence PT Goal: Ambulate - Progress: Goal set today Pt will Go Up / Down Stairs: 1-2 stairs;with rail(s);with supervision PT Goal: Up/Down Stairs - Progress: Goal set today Pt will Perform Home Exercise Program: Independently PT Goal: Perform Home Exercise Program - Progress: Goal set today  Visit Information  Last PT Received On: 04/22/12 Assistance Needed: +2    Subjective  Data  Subjective: My behind is sore! Patient Stated Goal: home   Prior Functioning  Home Living Lives With: Alone Available Help at Discharge: Personal care attendant Type of Home: House Home Access: Stairs to enter Secretary/administrator of Steps: 2 Home Layout: One level Bathroom Shower/Tub: Engineer, manufacturing systems: Standard Home Adaptive Equipment: Environmental consultant - rolling Prior Function Level of Independence: Needs assistance Needs Assistance: Bathing Bath: Minimal Able to Take Stairs?: Yes Driving: No Vocation: On disability Comments: reports she walks by herself with her walker, aide is there 6 days/wk 9am-1pm Communication Communication: No difficulties    Cognition  Cognition Overall Cognitive Status: Impaired Area of Impairment: Safety/judgement Arousal/Alertness: Awake/alert Safety/Judgement: Decreased awareness of need for assistance Cognition - Other Comments: decreased insight into how difficult and unsafe going home by herself would be    Extremity/Trunk Assessment Right Upper Extremity Assessment RUE ROM/Strength/Tone: [facility] for tasks assessed Left Upper Extremity Assessment LUE ROM/Strength/Tone: WFL for tasks assessed Right Lower Extremity Assessment RLE ROM/Strength/Tone: Deficits RLE ROM/Strength/Tone Deficits: grossly 4/5, generally weak RLE Sensation: History of peripheral neuropathy Left Lower Extremity Assessment LLE ROM/Strength/Tone: Deficits LLE ROM/Strength/Tone Deficits: grossly 4/5, generally weak LLE Sensation: History of peripheral neuropathy Trunk Assessment Trunk Assessment: Kyphotic;Other exceptions Trunk Exceptions: obese   Balance Balance Balance Assessed: Yes Static Standing Balance Static Standing - Balance Support: Bilateral upper extremity supported Static Standing - Level of Assistance: 5: Stand by assistance Static Standing - Comment/# of Minutes: pt stood 1 minute x3 for pericare, she was incontinent with stool needing  total assist to clean her up, while standing she was incontinent with stool but could not stand long enough because of weakness to finish cleaning her up so she had to sit down and rest prior to standing again for more pericare; O2 saturation during this time fluctuated low 80s-94% on 2 liters  End of Session PT - End of Session Equipment Utilized During Treatment: Gait belt;Oxygen Activity Tolerance: Patient limited by fatigue Patient left: in chair;with call bell/phone within reach Nurse Communication: Mobility status  GP     [facility] HELEN 04/22/2012, 1:51 PM

## 2012-04-23 LAB — COMPREHENSIVE METABOLIC PANEL
ALT: 26 U/L (ref 0–35)
AST: 31 U/L (ref 0–37)
Albumin: 3 g/dL — ABNORMAL LOW (ref 3.5–5.2)
Alkaline Phosphatase: 123 U/L — ABNORMAL HIGH (ref 39–117)
CO2: 26 mEq/L (ref 19–32)
Chloride: 100 mEq/L (ref 96–112)
GFR calc non Af Amer: 40 mL/min — ABNORMAL LOW (ref >90)
Potassium: 3.7 mEq/L (ref 3.5–5.1)
Sodium: 136 mEq/L (ref 135–145)
Total Bilirubin: 0.4 mg/dL (ref 0.3–1.2)

## 2012-04-23 LAB — CBC
Hemoglobin: 12.7 g/dL (ref 12.0–15.0)
MCH: 31.4 pg (ref 26.0–34.0)
Platelets: 229 10*3/uL (ref 150–400)
RBC: 4.05 MIL/uL (ref 3.87–5.11)
WBC: 6.9 10*3/uL (ref 4.0–10.5)

## 2012-04-23 LAB — PROTIME-INR: INR: 1.81 — ABNORMAL HIGH (ref 0.00–1.49)

## 2012-04-23 LAB — GLUCOSE, CAPILLARY: Glucose-Capillary: 143 mg/dL — ABNORMAL HIGH (ref 70–99)

## 2012-04-23 LAB — CLOSTRIDIUM DIFFICILE BY PCR: Toxigenic C. Difficile by PCR: NEGATIVE

## 2012-04-23 MED ORDER — LOPERAMIDE HCL 2 MG PO CAPS: 2.0000 mg | ORAL_CAPSULE | ORAL | Status: DC | PRN

## 2012-04-23 MED ORDER — FUROSEMIDE 20 MG PO TABS: 40.0000 mg | ORAL_TABLET | Freq: Two times a day (BID) | ORAL | Status: DC

## 2012-04-23 MED ORDER — ASPIRIN 81 MG PO TBEC: 81.0000 mg | DELAYED_RELEASE_TABLET | Freq: Every day | ORAL | Status: DC

## 2012-04-23 MED ORDER — HYDROCODONE-ACETAMINOPHEN 5-325 MG PO TABS
1.0000 | ORAL_TABLET | Freq: Once | ORAL | Status: AC
  Administered 2012-04-23: 1 via ORAL
  Filled 2012-04-23: qty 1

## 2012-04-23 MED ORDER — LOSARTAN POTASSIUM 50 MG PO TABS
50.0000 mg | ORAL_TABLET | Freq: Two times a day (BID) | ORAL | Status: DC
  Administered 2012-04-23: 50 mg via ORAL
  Filled 2012-04-23 (×3): qty 1

## 2012-04-23 MED ORDER — LOPERAMIDE HCL 2 MG PO CAPS
4.0000 mg | ORAL_CAPSULE | Freq: Once | ORAL | Status: AC
  Administered 2012-04-23: 4 mg via ORAL
  Filled 2012-04-23: qty 2

## 2012-04-23 MED ORDER — LOPERAMIDE HCL 2 MG PO CAPS: 2.0000 mg | ORAL_CAPSULE | ORAL | Status: AC | PRN

## 2012-04-23 NOTE — Progress Notes (Addendum)
Subjective: Ms. Kasparian was seen and examined at bedside.  She appeared uncomfortable this morning after just waking up but was able to orient her self to person place and time.  She is complaining of diffuse body aches today.  Per nursing, she continues to have diarrhea.  She was seen by PT yesterday who recommended SNF placement which she refuses.  I discussed in detail with her and her two son's yesterday in regards to her activity at home and supervision.  They informed me that she has a nursing aid that comes to the house every morning and they will work on having her come to the house more frequently and if possible home health PT would be greatly appreciated since she refuses to go to SNF at this time.  One son lives with her and is primary care take and her other son is power of attorney.  She denies any chest pain, N/V/D, fever, chills, abdominal pain, or any urinary complaints at this time. Down 1 pound since admission.   Objective: Vital signs in last 24 hours: Filed Vitals:   04/22/12 2052 04/22/12 2205 04/23/12 0411 04/23/12 1001  BP: 149/62  136/62 123/61  Pulse: 74  76 53  Temp: 98.2 F (36.8 C)  98.1 F (36.7 C)   TempSrc: Oral  Oral   Resp: 20  20   Height:      Weight:   280 lb 10.3 oz (127.3 kg)   SpO2: 89% 96% 94% 92%   Weight change: 7.1 oz (0.202 kg)  Intake/Output Summary (Last 24 hours) at 04/23/12 1055 Last data filed at 04/23/12 0825  Gross per 24 hour  Intake    360 ml  Output      0 ml  Net    360 ml   Vitals reviewed. General: resting in bed, uncomfortable, morbidly obese HEENT: PERRLA, EOMI, no scleral icterus Cardiac: RRR, no rubs, murmurs or gallops Pulm: mild scattered expiratory wheezing Abd: soft, nontender, nondistended, BS present Ext: warm and well perfused, no pedal edema, +1 b/l lower extremity pitting edema, +2dp b/l Neuro: alert and oriented X3, cranial nerves II-XII grossly intact, strength and sensation to light touch equal in bilateral  upper and lower extremities  Lab Results: Basic Metabolic Panel:  Recent Labs Lab 04/20/12 2112  04/22/12 0830 04/23/12 0917  NA  --   < > 139 136  K  --   < > 4.5 3.7  CL  --   < > 101 100  CO2  --   < > 26 26  GLUCOSE  --   < > 171* 147*  BUN  --   < > 45* 42*  CREATININE  --   < > 1.39* 1.25*  CALCIUM  --   < > 10.1 9.4  MG 2.0  --   --   --   PHOS 2.9  --   --   --   < > = values in this interval not displayed. Liver Function Tests:  Recent Labs Lab 04/21/12 0240 04/23/12 0917  AST 17 31  ALT 14 26  ALKPHOS 126* 123*  BILITOT 0.5 0.4  PROT 6.9 6.8  ALBUMIN 3.0* 3.0*   CBC:  Recent Labs Lab 04/21/12 0240 04/23/12 0915  WBC 8.1 6.9  HGB 12.1 12.7  HCT 37.3 38.3  MCV 95.2 94.6  PLT 234 229   Cardiac Enzymes:  Recent Labs Lab 04/20/12 2112 04/21/12 0240 04/21/12 0840  TROPONINI <0.30 <0.30 <0.30   BNP:  Recent Labs Lab 04/20/12 1730  PROBNP 2371.0*   CBG:  Recent Labs Lab 04/21/12 2107 04/22/12 0648 04/22/12 1127 04/22/12 1621 04/22/12 2119 04/23/12 0618  GLUCAP 127* 175* 167* 141* 132* 143*   Thyroid Function Tests:  Recent Labs Lab 04/20/12 2112  TSH 1.277   Coagulation:  Recent Labs Lab 04/20/12 2112 04/21/12 1856  LABPROT 29.8* 23.1*  INR 3.03* 2.15*   Micro Results: Recent Results (from the past 240 hour(s))  MRSA PCR SCREENING     Status: None   Collection Time    04/20/12 11:23 PM      Result Value Range Status   MRSA by PCR NEGATIVE  NEGATIVE Final   Comment:            The GeneXpert MRSA Assay (FDA     approved for NASAL specimens     only), is one component of a     comprehensive MRSA colonization     surveillance program. It is not     intended to diagnose MRSA     infection nor to guide or     monitor treatment for     MRSA infections.  CLOSTRIDIUM DIFFICILE BY PCR     Status: None   Collection Time    04/23/12  7:39 AM      Result Value Range Status   C difficile by pcr NEGATIVE  NEGATIVE Final    Medications: I have reviewed the patient's current medications. Scheduled Meds: . aspirin EC  81 mg Oral Daily  . atorvastatin  40 mg Oral q1800  . FLUoxetine  20 mg Oral BID  . furosemide  60 mg Oral BID  . gabapentin  200 mg Oral BID  . insulin aspart  0-9 Units Subcutaneous TID WC  . levothyroxine  150 mcg Oral QAC breakfast  . loperamide  4 mg Oral Once  . losartan  50 mg Oral BID  . mometasone-formoterol  2 puff Inhalation BID  . montelukast  10 mg Oral QHS  . pantoprazole  40 mg Oral Daily  . sodium chloride  3 mL Intravenous Q12H  . tiotropium  18 mcg Inhalation Daily  . Warfarin - Pharmacist Dosing Inpatient   Does not apply q1800   Continuous Infusions:  PRN Meds:.acetaminophen, albuterol  Assessment/Plan: Ms. Othman is a 79 y.o. female with a PMHx of oxygen dependent COPD (on 2L [place]), controlled DM2, atrial fibrillation on chronic coumadin, CAD (prior MI 20 years ago), CVD (without residual deficits) admitted on 04/20/2012 with symptoms of chest tightness and shortness of breath likely secondary to CHF exacerbation.   1) CHF exacerbation.  Presented with complaints of worsening shortness of breath now improved.  Has interval increase of pro-BNP 2371 and 1+ bilateral pitting edema in the setting of recent decrease in daily lasix dose.  No pulmonary edema on CXR or rales on exam. Oxygenating well at home O2 flow rate. She has a documented history of prior CM with EF 40%, recent echo 2/25 shows EF 55%, mild focal basal hypertrophy of septum.  LV diastolic function parameters were normal.  Moderate aortic stenosis with severe diffuse thickening and calcification with area 0.51cm2.   Discussed in detail with Dr. Anne Fu from cardiology yesterday--okay with increased dose of lasix but will require close follow up to monitor Cr and cardiology follow up in 2 weeks.      - continue lasix 60mg  BID  - Cotinue losartan, ASA, and statin, hold norvasc and spironolactone - daily weights,  strict  i/o's - trend renal function - PT--recommends SNF, however patient is not in agreement.  I discussed with her children and her yesterday who informed me that she has a nursing aid that comes to the house every morning but they will have her coming more frequently.  They are requesting home health aide and PT assistance and they will continue to discuss more supervised care with Ms. Spilman as well.  -placed order for home health assistance at this time  2) Dyspnea--likely secondary to CHF exacerbation.  Improving.  Please see above discussion.  She also has oxygen dependent COPD but does not appear to have exacerbation during this admission.  Her cough is chronic and unchanged but no increased production with mild wheezing. Currently on home oxygen, albulterol inhaler, advair, spiriva, and singulair at home. -continue home meds -prn nebulizer -oxygen therapy, keep o2 sat >92%   3) Atrial fibrillation--on chronic warfarin. She had previously taken diltiazem for rate control, now not on any rate-controlling medications in setting of bradycardia (HR 50s). Bradycardia in absence of nodal blocking agent suggests poorer compensation for atrial fibrillation.  Appeared to be in sinus rhythm on admission. INR was 3. Follows w Dr. Alexandria Lodge as outpatient. ,  - Continue warfarin  - monitor INR - appreciate cardiology input--Discussed with Dr. Dione Housekeeper from cardiology in detail: continue ARB and Lasix and ASA and Statin and hold BB. Ultimately will need non-invasive ischemic work up once CHF resolves. Follow up 2 weeks with cardiology.  4) CAD, nonobstructive--history of prior MI. In 2006, cath was performed which showed 30-40% stenosis of circumflex, no interventions. Not on a beta blocker due to bradycardia.  - Continue statin  - ASA 81mg   - cardiology following  5) Hypothyroidism--Symptoms could represent hypothyroidism, but her synthroid dose was recently increased on 03/31/12 from 125 to 150 mcg daily,  making this less likely.  - TSH 1.277 - continue synthroid qd  6) HTN-- BP on admission 115/48.  On Norvasc 5mg , Losartan 50mg  bid, Lasix 40mg  in am and 20mg  in pm, and spironolactone 25mg  at home. - Continue ARB, and increased dose of lasix of 60mg  bid - Restart losartan home dose and hold norvasc in setting of increased lasix dose as well--will d/c home back on lasix 40mg  bid with close follow up and restart norvasc - Continue to hold spironolactone   7) Diabetes--Last HbA1c 7.0 earlier this month. On glipizide 10mg  daily at home, although per chart she should be taking twice daily.  - Hold glipizide - SSI while inpatient   8) Gout--Clinically stable. Uric acid not at goal (9.8 as of 03/31/12). Will not make adjustments while inpatient.  - Continue colchicine   9) Depression--appears stable at this time  - Continue fluoxetine   10) GERD--Stable at this time  - Continue omeprazole   11) Hyperkalemia--Potassium 5.4 on admission, no EKG changes. Decreased with diuresis, holding spironolactone.  - Continue to monitor   12) CKD--baseline Cr ~1.2.  Cr 1.18 on admission, initially increased with diuresis, trending down today cr 1.25 2/27 -Trend BMET -f/u Cr closely outpatient  13) Diarrhea--new onset, possibly secondary to miralax.  Mild elevated ALP on admission 134 trend--trending down.  -d/c miralax and colchicine -continue to observe -monitor LFTs -cdiff pcr negative -imodium prn  DVT PPX - warfarin  CODE STATUS - FULL CONSULTS PLACED - none DISPO - pending further clinical improvement The patient does have a current PCP (BHARDWAJ, SHILPA, MD) and does need an [facility] hospital follow-up appointment after discharge.  Is the [facility] hospital follow-up appointment a one-time only appointment? not applicable.  Does the patient have transportation limitations that hinder transportation to clinic appointments? unknown.  Services Needed at time of discharge: Y = Yes, Blank = No PT:  Recommends SNF, pt does not agree, will proceed with home health aid and PT if possible  OT:   RN: Home health  Equipment:   Other:     LOS: 3 days   Darden Palmer 04/23/2012, 10:55 AM

## 2012-04-23 NOTE — Progress Notes (Signed)
Visit to patient while in hospital. Will call after she is discharged to assist with transition of care. 

## 2012-04-23 NOTE — Progress Notes (Signed)
SUBJECTIVE:  Complains of diarrhea  OBJECTIVE:   Vitals:   Filed Vitals:   04/22/12 1713 04/22/12 2052 04/22/12 2205 04/23/12 0411  BP: 158/56 149/62  136/62  Pulse: 76 74  76  Temp:  98.2 F (36.8 C)  98.1 F (36.7 C)  TempSrc:  Oral  Oral  Resp:  20  20  Height:      Weight:    127.3 kg (280 lb 10.3 oz)  SpO2:  89% 96% 94%   I&O's:   Intake/Output Summary (Last 24 hours) at 04/23/12 0743 Last data filed at 04/22/12 1700  Gross per 24 hour  Intake    723 ml  Output      0 ml  Net    723 ml   TELEMETRY: Reviewed telemetry pt in NSR:     PHYSICAL EXAM General: Well developed, well nourished, in no acute distress Head: Eyes PERRLA, No xanthomas.   Normal cephalic and atramatic  Lungs:   No crackles anteriorly Heart:   HRRR S1 S2 Pulses are 2+ & equal. Abdomen: Bowel sounds are positive, abdomen soft and non-tender without masses  Extremities:   No clubbing, cyanosis or edema.  DP +1 Neuro: Alert and oriented X 3. Psych:  Good affect, responds appropriately   LABS: Basic Metabolic Panel:  Recent Labs  16/10/96 2112 04/21/12 0240 04/22/12 0830  NA  --  141 139  K  --  5.1 4.5  CL  --  103 101  CO2  --  28 26  GLUCOSE  --  132* 171*  BUN  --  36* 45*  CREATININE  --  1.30* 1.39*  CALCIUM  --  9.9 10.1  MG 2.0  --   --   PHOS 2.9  --   --    Liver Function Tests:  Recent Labs  04/20/12 2112 04/21/12 0240  AST 17 17  ALT 15 14  ALKPHOS 134* 126*  BILITOT 0.5 0.5  PROT 7.2 6.9  ALBUMIN 3.2* 3.0*   No results found for this basename: LIPASE, AMYLASE,  in the last 72 hours CBC:  Recent Labs  04/20/12 1730 04/21/12 0240  WBC 9.9 8.1  HGB 13.3 12.1  HCT 40.1 37.3  MCV 94.8 95.2  PLT 255 234   Cardiac Enzymes:  Recent Labs  04/20/12 2112 04/21/12 0240 04/21/12 0840  TROPONINI <0.30 <0.30 <0.30    Thyroid Function Tests:  Recent Labs  04/20/12 2112  TSH 1.277   Anemia Panel: No results found for this basename: VITAMINB12,  FOLATE, FERRITIN, TIBC, IRON, RETICCTPCT,  in the last 72 hours Coag Panel:   Lab Results  Component Value Date   INR 2.15* 04/21/2012   INR 3.03* 04/20/2012   INR 2.50 04/06/2012    RADIOLOGY: Dg Chest 2 View  04/20/2012  *RADIOLOGY REPORT*  Clinical Data: Chest pain and shortness of breath  CHEST - 2 VIEW  Comparison: 03/27/2011 and prior chest radiographs  Findings: Upper limits normal heart size and mild peribronchial thickening again noted. Elevation of the right hemidiaphragm is unchanged. There is no evidence of focal airspace disease, pulmonary edema, suspicious pulmonary nodule/mass, pleural effusion, or pneumothorax. No acute bony abnormalities are identified.  IMPRESSION: No evidence of acute cardiopulmonary disease.   Original Report Authenticated By: Harmon Pier, M.D.    ASSESSMENT:  1. Acute on chronic diastolic CHF with a history of normal LVF by echo 2008 - she recently had her Lasix dose decreased - appears euvolemic on exam today  2. CAD with remote MI 1993 - cath 2006 with 30-40% left circ stenosis  3. Chronic Respiratory failure with bronchospastic component and O2 dependent COPD  4. Postablative hypothyroidism - with elevated TSH  5. Dyslipidemia  6. GErD  7. History of PE /DVT with placement of [place] filter 1998  8. Type II DM  9. Chronic atrial fibrillation - currently not on rate controlling drugs due to bradycardia  10. Systemic anticoagulation with therapeutic INR 11.  Moderate AS by echo PLAN:   1.  Continue  ARB, Lasix, statin and ASA 2.  Since all cardiac enzymes are normal and LVF is normal  and no chest pain would not pursue further cardiac w/u at this time unless she develops chest pain.       Quintella Reichert, MD  04/23/2012  7:43 AM

## 2012-04-23 NOTE — Discharge Summary (Signed)
[facility] Discharge Note  Name: [name] MRN: [id number] DOB: 03-18-1932 79 y.o.  Date of Admission: 04/20/2012  5:27 PM Date of Discharge: 04/23/2012 Attending Physician: Gardiner Barefoot, MD  Discharge Diagnosis: Principal Problem:   Acute on chronic diastolic CHF (congestive heart failure) Active Problems:   HYPOTHYROIDISM   DIABETES MELLITUS   HYPERLIPIDEMIA   DEPRESSION   HYPERTENSION   Atrial fibrillation   CKD (chronic kidney disease) stage 3, GFR 30-59 ml/min   CAD (coronary artery disease)  Discharge Medications:   Medication List    STOP taking these medications       colchicine 0.6 MG tablet  Commonly known as:  COLCRYS     polyethylene glycol packet  Commonly known as:  MIRALAX / GLYCOLAX     spironolactone 25 MG tablet  Commonly known as:  ALDACTONE      TAKE these medications       acetaminophen 500 MG tablet  Commonly known as:  TYLENOL  Take 1,000 mg by mouth 2 (two) times daily as needed. For pain.     albuterol 108 (90 BASE) MCG/ACT inhaler  Commonly known as:  PROVENTIL HFA  Inhale 2 puffs into the lungs every 6 (six) hours as needed. For wheezing.     amLODipine 5 MG tablet  Commonly known as:  NORVASC  Take 5 mg by mouth daily.     aspirin 81 MG EC tablet  Take 1 tablet (81 mg total) by mouth daily.     atorvastatin 40 MG tablet  Commonly known as:  LIPITOR  TAKE ONE TABLET BY MOUTH EVERY DAY     FLUoxetine 20 MG capsule  Commonly known as:  PROZAC  Take 20 mg by mouth 2 (two) times daily.     Fluticasone-Salmeterol 250-50 MCG/DOSE Aepb  Commonly known as:  ADVAIR  Inhale 1 puff into the lungs every 12 (twelve) hours.     furosemide 20 MG tablet  Commonly known as:  LASIX  Take 2 tablets (40 mg total) by mouth 2 (two) times daily.     gabapentin 100 MG capsule  Commonly known as:  NEURONTIN  TAKE TWO CAPSULES BY MOUTH TWICE DAILY     glipiZIDE 10 MG tablet  Commonly known as:   GLUCOTROL  Take 10 mg by mouth 2 (two) times daily before a meal.     levothyroxine 150 MCG tablet  Commonly known as:  SYNTHROID  Take 1 tablet (150 mcg total) by mouth daily.     loperamide 2 MG capsule  Commonly known as:  IMODIUM  Take 1 capsule (2 mg total) by mouth as needed for diarrhea or loose stools (up to total 16mg  daily).     losartan 50 MG tablet  Commonly known as:  COZAAR  TAKE ONE TABLET BY MOUTH TWICE DAILY     montelukast 10 MG tablet  Commonly known as:  SINGULAIR  TAKE ONE TABLET BY MOUTH EVERY DAY     omeprazole 20 MG capsule  Commonly known as:  PRILOSEC  Take 40 mg by mouth 2 (two) times daily.     tiotropium 18 MCG inhalation capsule  Commonly known as:  SPIRIVA HANDIHALER  Place 1 capsule (18 mcg total) into inhaler and inhale daily.     warfarin 5 MG tablet  Commonly known as:  COUMADIN  Take 5 mg by mouth every evening.       Disposition and follow-up:   [name] was discharged  from [facility] in Stable condition.  At the hospital follow up visit please address: CHF exacerbation--discharged on Lasix 40mg  po BID, weight 280lb on discharge, was on 60mg  bid in the hospital.  Restarted losartan 50mg  bid and norvasc on discharge.  Held spironolactone.   CKD--Cr increase likely secondary to diuresis.  Cr 1.39 on discharge. Will need follow up BMET and close monitoring and adjust lasix dose as needed.  Atrial fibrillation--on chronic coumadin, holding BB in setting of bradycardia.  Monitor INR, will need follow up for improved rate control.   Diarrhea--new onset near discharge.  cdiff negative.  Stopped colchicine and miralax.  Usually constipated on home with relief with miralax.  Given imodium for diarrhea relief.    Follow-up Appointments: Follow-up Information   Follow up with Aletta Edouard, MD On 05/12/2012. (@915 )    Contact information:   [address] [place] [address] [phone]        Follow up with [facility] On 04/27/2012. (for lab draw--BMET, monitor Cr @11am  after coumadin check)    Contact information:   [address] [id number] [place] [address] [phone]      Follow up with Quintella Reichert, MD On 05/06/2012. (@1145am , arrive @1130am )    Contact information:   [address] [place] [address] [phone]      Discharge Orders   Future Appointments Provider Department Dept Phone   04/27/2012 11:00 AM [facility] [phone]   04/27/2012 11:30 AM [facility] Lab [facility] [phone]   05/12/2012 9:15 AM Aletta Edouard, MD [facility] [phone]   06/23/2012 8:15 AM Sherrie George, MD [facility] [phone]   Future Orders Complete By Expires     Call MD for:  difficulty breathing, headache or visual disturbances  As directed     Call MD for:  extreme fatigue  As directed     Call MD for:  severe uncontrolled pain  As directed     Call MD for:  As directed     Comments:      Continued diarrhea    Diet - low sodium heart healthy  As directed     Increase activity slowly  As directed       Consultations:Cardiology--[facility] Team:  Quintella Reichert, MD  Procedures Performed:  Dg Chest 2 View  04/20/2012  *RADIOLOGY REPORT*  Clinical Data: Chest pain and shortness of breath  CHEST - 2 VIEW  Comparison: 03/27/2011 and prior chest radiographs  Findings: Upper limits normal heart size and mild peribronchial thickening again noted. Elevation of the right hemidiaphragm is unchanged. There is no evidence of focal airspace disease, pulmonary edema, suspicious pulmonary nodule/mass, pleural effusion, or pneumothorax. No acute bony abnormalities are identified.  IMPRESSION: No evidence of acute cardiopulmonary disease.   Original Report Authenticated By: Harmon Pier, M.D.    2D Echo:  Transthoracic Echocardiography  Patient: [name], [name] MR #: [id number] Study Date: 04/21/2012 Gender: F Age: 79 Height: 154.9cm Weight: 127.7kg BSA: 2.35m^2 Pt. Status: Room: 4733  PERFORMING [facility], Ec ADMITTING Comer, Robert ATTENDING Staci Righter SONOGRAPHER Nolon Rod, RDCS Cleopatra Cedar cc:  ------------------------------------------------------------ LV EF: 55%  ------------------------------------------------------------ History: PMH: elevated lipid, obesity diastolic chf Congestive heart failure. Stroke. Chronic obstructive pulmonary disease. Risk factors: Current tobacco use. Hypertension. Diabetes mellitus. Dyslipidemia.  ------------------------------------------------------------ Study Conclusions  - Left  ventricle: The cavity size was normal. There was mild focal basal hypertrophy of the septum. The estimated ejection fraction was 55%. Left ventricular diastolic function parameters were normal. - Aortic valve: Severe diffuse thickening and calcification. There was moderate stenosis. Mild regurgitation. Valve area: 0.57cm^2(VTI). Valve area: 0.51cm^2 (Vmax). Transthoracic echocardiography. M-mode, complete 2D, spectral Doppler, and color Doppler. Height: Height: 154.9cm. Height: 61in. Weight: Weight: 127.7kg. Weight: 281lb. Body mass index: BMI: 53.2kg/m^2. Body surface area: BSA: 2.14m^2. Blood pressure: 176/69. Patient status: Inpatient. Location: Bedside.  ------------------------------------------------------------  ------------------------------------------------------------ Left ventricle: The cavity size was normal. There was mild focal basal hypertrophy of the septum. The estimated ejection fraction was 55%. Images were inadequate for LV wall motion assessment. The transmitral flow pattern was normal. The deceleration time of the early transmitral flow velocity was normal. The pulmonary vein flow pattern was normal.  The tissue Doppler parameters were normal. Left ventricular diastolic function parameters were normal.  ------------------------------------------------------------ Aortic valve: The AVA appears underestimated. By peak velocity and mean gradient and visually it appears to be moderate AS. Poorly visualized. Severe diffuse thickening and calcification. Doppler: There was moderate stenosis. Mild regurgitation. VTI ratio of LVOT to aortic valve: 0.29. Valve area: 0.57cm^2(VTI). Indexed valve area: 0.23cm^2/m^2 (VTI). Peak velocity ratio of LVOT to aortic valve: 0.26. Valve area: 0.51cm^2 (Vmax). Indexed valve area: 0.21cm^2/m^2 (Vmax). Mean gradient: 26mm Hg (S). Peak gradient: 41mm Hg (S).  ------------------------------------------------------------ Left atrium: The atrium was normal in size.  ------------------------------------------------------------ Atrial septum: Poorly visualized.  ------------------------------------------------------------ Right ventricle: The cavity size was normal. Wall thickness was normal. Systolic function was normal.  ------------------------------------------------------------ Pulmonic valve: Poorly visualized. Structurally normal valve. Cusp separation was normal. Doppler: Transvalvular velocity was within the normal range. No regurgitation.  ------------------------------------------------------------ Tricuspid valve: Poorly visualized. Structurally normal valve. Leaflet separation was normal. Doppler: Transvalvular velocity was within the normal range. No regurgitation.  ------------------------------------------------------------ Pulmonary artery: Poorly visualized.  ------------------------------------------------------------ Right atrium: Poorly visualized.  ------------------------------------------------------------ Pericardium: The pericardium was normal in  appearance.  ------------------------------------------------------------  2D measurements Normal Doppler measurements Norma Left ventricle l LVID ED, 48.7 mm 43-52 LVOT chord, Peak vel, 82 cm/s ----- PLAX S LVID ES, 30.2 mm 23-38 VTI, S 24. cm ----- chord, 1 PLAX Aortic valve FS, chord, 38 % >29 Peak vel, 321 cm/s ----- PLAX S LVPW, ED 10.1 mm ------ Mean vel, 244 cm/s ----- IVS/LVPW 1.25 <1.3 S ratio, ED VTI, S 84. cm ----- Ventricular septum 4 IVS, ED 12.6 mm ------ Mean 26 mm Hg ----- LVOT gradient, Diam, S 16 mm ------ S Area 2.01 cm^2 ------ Peak 41 mm Hg ----- Aorta gradient, Root diam, 29 mm ------ S ED VTI ratio 0.2 ----- Left atrium LVOT/AV 9 AP dim 37 mm ------ Area, VTI 0.5 cm^2 ----- AP dim 1.52 cm/m^2 <2.2 7 index Area 0.2 cm^2/m^2 ----- index 3 (VTI) Peak vel 0.2 ----- ratio, 6 LVOT/AV Area, 0.5 cm^2 ----- Vmax 1 Area 0.2 cm^2/m^2 ----- index 1 (Vmax) Regurg 636 ms ----- PHT  ------------------------------------------------------------ Prepared and Electronically Authenticated by  Armanda Magic 2014-02-26T09:16:04.070  Admission HPI: Patient is a 79 y.o. female with a PMHx of oxygen dependent COPD (on 2L ), controlled DM2, atrial fibrillation on chronic coumadin, CAD (prior MI 20 years ago), CVD (without residual deficits) who presents to [facility] for evaluation of shortness of breath and nonproductive cough x 2 days. Patient describes shortness of breath as persistent throughout the day despite level of activity, and with associated mild wheezing and mild increased lower extremity edema from her  baseline. This has required her to increase albuterol usage from 3 times to 4-5 times daily. She confirms chronic orthopnea and PND that are unchanged from baseline. Is not having any chest pain.  Overall, she reports that she is feeling much more fatigued.  Denies recent sick contact, fevers, chills, change in appetite.  Recent decrease of lasix dose from 80mg   daily to 60 mg daily. She does not check her weight regularly for her heart failure.  Few week history of sore throat and nasal congestion.  Hospital Course by problem list:    Acute on chronic diastolic CHF (congestive heart failure)--Presented with complaints of worsening shortness of breath now improved. Has interval increase of pro-BNP 2371 and 1+ bilateral pitting edema in the setting of recent decrease in daily lasix dose. No pulmonary edema on CXR or rales on exam. Oxygenating well at home O2 flow rate. She has a documented history of prior CM with EF 40%, recent echo 2/25 shows EF 55%, mild focal basal hypertrophy of septum.  LV diastolic function parameters were normal. Moderate aortic stenosis with severe diffuse thickening and calcification with area 0.51cm2. Cardiology was consulted, okay with lasix, asprin, ARB, and statin.  Decreased lasix to 40mg  bid (was receiving 60mg  bid in the hospital) and restarted norvasc on discharge but will continue to hold spironolactone until cardiology follow up.  Physical therapy was also consulted and recommended SNF, however patient was not in agreement. We discussed with her children and her who informed me that she has a nursing aid that comes to the house every morning but they will have her coming more frequently. They are requesting home health aide and PT assistance and they will continue to discuss more supervised care with Ms. Brazel as well. Follow up with PCP and cardiology.      HYPOTHYROIDISM--Symptoms could represent hypothyroidism, but her synthroid dose was recently increased on 03/31/12 from 125 to 150 mcg daily, making this less likely. TSH during admission 1.277.  Continued on synthroid qd during admission and on discharge.  Follow up with pcp.      DIABETES MELLITUS--Last HbA1c 7.0 earlier this month. On glipizide 10mg  daily at home, although per chart she should be taking twice daily. On sliding scale insulin during admission and  restarted home glipizide on discharge.  Follow up with pcp.     HYPERLIPIDEMIA--lipid profile from 07/2011: LDL 62, CHOL 136, TG 192, HDL 36, AND VLDL 38.  On lipitor 40mg  at home.  Morbidly obese.  Maintained on statin therapy during admission and on discharge.  Follow up with pcp.      DEPRESSION--on prozac 20mg  at home.  Continued during admission.  Appeared stable during hospital course, no evidence of suicidal or homicidal ideation. Follow up with pcp.     HYPERTENSION--BP on admission 115/48. On Norvasc 5mg , Losartan 50mg  bid, Lasix 40mg  in am and 20mg  in pm, and spironolactone 25mg  at home. Resumed on losartan, norvasc, and lasix 40mg  bid on discharge.  was getting lasix 60mg  bid during hospital course.  Holding spironolactone and not on BB in setting of bradycardia.  Will need close follow up with pcp and cardiology.     Atrial fibrillation--on chronic warfarin. She had previously taken diltiazem for rate control, now not on any rate-controlling medications in setting of bradycardia (HR 50s). Bradycardia in absence of nodal blocking agent suggests poorer compensation for atrial fibrillation. Appeared to be in sinus rhythm on admission. INR was 3. Follows w Dr. Alexandria Lodge as outpatient.  Continued on warfarin during admission and on discharge.  Cardiology consulted in regards to bradycardia.  Agree with no BB at this time.  Follow up with cardiology in 2 weeks and pcp.      CKD (chronic kidney disease) stage 3, GFR 30-59 ml/min--baseline Cr ~1.2. Cr 1.18 on admission, initially increased with diuresis, trending down today Cr on discharge 1.25 2/27 .      CAD (coronary artery disease)--history of prior MI. In 2006, cath was performed which showed 30-40% stenosis of circumflex, no interventions. Not on a beta blocker due to bradycardia. Cardiology consulted during hospital course.  Recommended continuing asprin, statin, ARB, and lasix.  Will need cardiology outpatient follow up.     Diarrhea--new onset  last 2 days of hospital admission.  Claims to be constipated normally at home and on miralax.  Miralax and colchicine stopped due to diarrhea and on discharge.  Cdiff negative.  Given imodium.  Follow up with pcp.   Discharge Vitals:  BP 123/61  Pulse 53  Temp(Src) 98.1 F (36.7 C) (Oral)  Resp 20  Ht 5\' 1"  (1.549 m)  Wt 280 lb 10.3 oz (127.3 kg)  BMI 53.05 kg/m2  SpO2 93%  LMP 05/22/1968  Discharge Labs:  Results for orders placed during the hospital encounter of 04/20/12 (from the past 24 hour(s))  GLUCOSE, CAPILLARY     Status: Abnormal   Collection Time    04/22/12  4:21 PM      Result Value Range   Glucose-Capillary 141 (*) 70 - 99 mg/dL   Comment 1 Documented in Chart     Comment 2 Notify RN    GLUCOSE, CAPILLARY     Status: Abnormal   Collection Time    04/22/12  9:19 PM      Result Value Range   Glucose-Capillary 132 (*) 70 - 99 mg/dL   Comment 1 Notify RN    GLUCOSE, CAPILLARY     Status: Abnormal   Collection Time    04/23/12  6:18 AM      Result Value Range   Glucose-Capillary 143 (*) 70 - 99 mg/dL  CLOSTRIDIUM DIFFICILE BY PCR     Status: None   Collection Time    04/23/12  7:39 AM      Result Value Range   C difficile by pcr NEGATIVE  NEGATIVE  CBC     Status: None   Collection Time    04/23/12  9:15 AM      Result Value Range   WBC 6.9  4.0 - 10.5 K/uL   RBC 4.05  3.87 - 5.11 MIL/uL   Hemoglobin 12.7  12.0 - 15.0 g/dL   HCT 82.9  56.2 - 13.0 %   MCV 94.6  78.0 - 100.0 fL   MCH 31.4  26.0 - 34.0 pg   MCHC 33.2  30.0 - 36.0 g/dL   RDW 86.5  78.4 - 69.6 %   Platelets 229  150 - 400 K/uL  COMPREHENSIVE METABOLIC PANEL     Status: Abnormal   Collection Time    04/23/12  9:17 AM      Result Value Range   Sodium 136  135 - 145 mEq/L   Potassium 3.7  3.5 - 5.1 mEq/L   Chloride 100  96 - 112 mEq/L   CO2 26  19 - 32 mEq/L   Glucose, Bld 147 (*) 70 - 99 mg/dL   BUN 42 (*) 6 - 23 mg/dL   Creatinine, Ser 2.95 (*)  0.50 - 1.10 mg/dL   Calcium 9.4  8.4 -  16.1 mg/dL   Total Protein 6.8  6.0 - 8.3 g/dL   Albumin 3.0 (*) 3.5 - 5.2 g/dL   AST 31  0 - 37 U/L   ALT 26  0 - 35 U/L   Alkaline Phosphatase 123 (*) 39 - 117 U/L   Total Bilirubin 0.4  0.3 - 1.2 mg/dL   GFR calc non Af Amer 40 (*) >90 mL/min   GFR calc Af Amer 47 (*) >90 mL/min  GLUCOSE, CAPILLARY     Status: Abnormal   Collection Time    04/23/12 12:10 PM      Result Value Range   Glucose-Capillary 133 (*) 70 - 99 mg/dL   Comment 1 Notify RN     Signed: Darden Palmer 04/23/2012, 1:30 PM   Time Spent on Discharge: 35 minutes Services Ordered on Discharge: home health services: nursing and PT Equipment Ordered on Discharge: has walker at home

## 2012-04-23 NOTE — Progress Notes (Signed)
04/23/12 1330 In to speak with pt. about Home Health sevices.  Pt. chose [facility].  TC to Debbie, with [facility], to give referral for [facility] RN, [facility] PT/OT.  Pt. has home continuous oxygen thru [facility] as well.  Pt. states she has a rolling walker, and 3-N-1 at home presently.   Pt. to dc home today with son. Tera Mater, RN, BSN NCM [phone]

## 2012-04-23 NOTE — Progress Notes (Signed)
Given DC education to pt, pt verbalized understanding.  Pt DC home via wc.

## 2012-04-24 NOTE — Discharge Summary (Signed)
Plan discussed with patient and resident team and I agree with plan as outlined above.

## 2012-04-24 NOTE — ED Provider Notes (Signed)
History     CSN: [id number]  Arrival date & time 04/20/12  1708   First MD Initiated Contact with Patient 04/20/12 1730      Chief Complaint  Patient presents with  . Chest Pain  . Shortness of Breath   Note filed late    (Consider location/radiation/quality/duration/timing/severity/associated sxs/prior treatment) HPI  The patient presents with new chest tightness, dyspnea.  Symptoms began several days ago without clear precipitant.  There is minimal pain, but the patient states that she is"tight".  Since onset symptoms have not been controlled with home albuterol use, nor with home oxygen use.  There is no new fever.  There is associated cough.  There is no new syncope, particular weakness.  Patient recently decreased Lasix dosing.  Past Medical History  Diagnosis Date  . CAD (coronary artery disease)     s/p remote MI (1993)  . Abdominal pain     recent admission, likely secondary to presbyesophagus and gastric dysmotility  . CVD (cardiovascular disease)   . Chronic diastolic heart failure     with dilated cardiomyopathy, last EF 55%(09/2006)  . Respiratory failure, chronic     mixed etiology with bronchospastic component  . Urinary incontinence     recurrent uti's/resistance to cipro, bactrim  . Gout   . Postablative hypothyroidism     H/o Graves disease s/p radioactive iodine ablation with resultant postablative hypothyroidism  . Hyperlipidemia   . Depression   . Morbidly obese     s/p gastric plication surgery  . Gastroesophageal reflux disease   . Ventral hernia     repair in April 2008, complicated by MRSA abdominal wall cellulitis  . DVT (deep venous thrombosis) 1998  . Pulmonary embolism 1998    1998, s/p Greenfield filter  . Diabetes mellitus type 2, controlled, with complications   . Hypertension   . History of recurrent UTIs     and history of pyelonephritis  . AF (atrial fibrillation)     on chronic warfarin  . Osteoarthritis   . Myocardial  infarction   . Stroke 1997    denies residual  . Anemia     Blood transfusion [V58.2]    Past Surgical History  Procedure Laterality Date  . Cholecystectomy    . Appendectomy    . Breast biopsy    . Gastric bypass  1970's  . Hernia repair  05/2006    ventral hernia  . Greenfield filter placement  1998    Family History  Problem Relation Age of Onset  . Colon cancer Neg Hx     History  Substance Use Topics  . Smoking status: Former Smoker -- 1.00 packs/day for 1.5 years    Types: Cigarettes  . Smokeless tobacco: Never Used  . Alcohol Use: No    OB History   Grav Para Term Preterm Abortions TAB SAB Ect Mult Living                  Review of Systems  Constitutional:       Per HPI, otherwise negative  HENT:       Per HPI, otherwise negative  Respiratory:       Per HPI, otherwise negative  Cardiovascular:       Per HPI, otherwise negative  Gastrointestinal: Negative for vomiting.  Endocrine:       Negative aside from HPI  Genitourinary:       Neg aside from HPI   Musculoskeletal:       Per  HPI, otherwise negative  Skin: Negative.   Neurological: Negative for syncope.    Allergies  Nitrofurantoin  Home Medications   Current Outpatient Rx  Name  Route  Sig  Dispense  Refill  . acetaminophen (TYLENOL) 500 MG tablet   Oral   Take 1,000 mg by mouth 2 (two) times daily as needed. For pain.         Marland Kitchen albuterol (PROVENTIL HFA) 108 (90 BASE) MCG/ACT inhaler   Inhalation   Inhale 2 puffs into the lungs every 6 (six) hours as needed. For wheezing.   3.7 g   11   . amLODipine (NORVASC) 5 MG tablet   Oral   Take 5 mg by mouth daily.         Marland Kitchen atorvastatin (LIPITOR) 40 MG tablet      TAKE ONE TABLET BY MOUTH EVERY DAY   30 tablet   4   . FLUoxetine (PROZAC) 20 MG capsule   Oral   Take 20 mg by mouth 2 (two) times daily.         . Fluticasone-Salmeterol (ADVAIR) 250-50 MCG/DOSE AEPB   Inhalation   Inhale 1 puff into the lungs every 12  (twelve) hours.   60 each   3   . gabapentin (NEURONTIN) 100 MG capsule      TAKE TWO CAPSULES BY MOUTH TWICE DAILY   120 capsule   2   . glipiZIDE (GLUCOTROL) 10 MG tablet   Oral   Take 10 mg by mouth 2 (two) times daily before a meal.         . levothyroxine (SYNTHROID) 150 MCG tablet   Oral   Take 1 tablet (150 mcg total) by mouth daily.   30 tablet   11   . losartan (COZAAR) 50 MG tablet      TAKE ONE TABLET BY MOUTH TWICE DAILY   60 tablet   4   . montelukast (SINGULAIR) 10 MG tablet      TAKE ONE TABLET BY MOUTH EVERY DAY   30 tablet   6   . omeprazole (PRILOSEC) 20 MG capsule   Oral   Take 40 mg by mouth 2 (two) times daily.         Marland Kitchen tiotropium (SPIRIVA HANDIHALER) 18 MCG inhalation capsule   Inhalation   Place 1 capsule (18 mcg total) into inhaler and inhale daily.   30 capsule   4   . warfarin (COUMADIN) 5 MG tablet   Oral   Take 5 mg by mouth every evening.         Marland Kitchen aspirin EC 81 MG EC tablet   Oral   Take 1 tablet (81 mg total) by mouth daily.         . furosemide (LASIX) 20 MG tablet   Oral   Take 2 tablets (40 mg total) by mouth 2 (two) times daily.   120 tablet   1   . loperamide (IMODIUM) 2 MG capsule   Oral   Take 1 capsule (2 mg total) by mouth as needed for diarrhea or loose stools (up to total 16mg  daily).   8 capsule   0     BP 140/56  Pulse 52  Temp(Src) 98 F (36.7 C) (Oral)  Resp 20  Ht 5\' 1"  (1.549 m)  Wt 280 lb 10.3 oz (127.3 kg)  BMI 53.05 kg/m2  SpO2 91%  LMP 05/22/1968  Physical Exam  Nursing note and vitals reviewed. Constitutional: She is oriented  to person, place, and time. She appears well-developed and well-nourished. No distress.  HENT:  Head: Normocephalic and atraumatic.  Eyes: Conjunctivae and EOM are normal.  Cardiovascular: Regular rhythm.  Bradycardia present.   Pulmonary/Chest: No stridor. Tachypnea noted. She has wheezes.  Abdominal: She exhibits no distension.  Musculoskeletal: She  exhibits no edema.  Neurological: She is alert and oriented to person, place, and time. No cranial nerve deficit.  Skin: Skin is warm and dry.  Psychiatric: She has a normal mood and affect.    ED Course  Procedures (including critical care time)  Labs Reviewed  BASIC METABOLIC PANEL - Abnormal; Notable for the following:    Potassium 5.4 (*)    Glucose, Bld 170 (*)    BUN 35 (*)    Creatinine, Ser 1.18 (*)    GFR calc non Af Amer 43 (*)    GFR calc Af Amer 50 (*)    All other components within normal limits  PRO B NATRIURETIC PEPTIDE - Abnormal; Notable for the following:    Pro B Natriuretic peptide (BNP) 2371.0 (*)    All other components within normal limits  HEPATIC FUNCTION PANEL - Abnormal; Notable for the following:    Albumin 3.2 (*)    Alkaline Phosphatase 134 (*)    All other components within normal limits  PROTIME-INR - Abnormal; Notable for the following:    Prothrombin Time 29.8 (*)    INR 3.03 (*)    All other components within normal limits  COMPREHENSIVE METABOLIC PANEL - Abnormal; Notable for the following:    Glucose, Bld 132 (*)    BUN 36 (*)    Creatinine, Ser 1.30 (*)    Albumin 3.0 (*)    Alkaline Phosphatase 126 (*)    GFR calc non Af Amer 39 (*)    GFR calc Af Amer 45 (*)    All other components within normal limits  GLUCOSE, CAPILLARY - Abnormal; Notable for the following:    Glucose-Capillary 129 (*)    All other components within normal limits  GLUCOSE, CAPILLARY - Abnormal; Notable for the following:    Glucose-Capillary 169 (*)    All other components within normal limits  GLUCOSE, CAPILLARY - Abnormal; Notable for the following:    Glucose-Capillary 158 (*)    All other components within normal limits  PROTIME-INR - Abnormal; Notable for the following:    Prothrombin Time 23.1 (*)    INR 2.15 (*)    All other components within normal limits  GLUCOSE, CAPILLARY - Abnormal; Notable for the following:    Glucose-Capillary 127 (*)     All other components within normal limits  BASIC METABOLIC PANEL - Abnormal; Notable for the following:    Glucose, Bld 171 (*)    BUN 45 (*)    Creatinine, Ser 1.39 (*)    GFR calc non Af Amer 36 (*)    GFR calc Af Amer 41 (*)    All other components within normal limits  GLUCOSE, CAPILLARY - Abnormal; Notable for the following:    Glucose-Capillary 175 (*)    All other components within normal limits  GLUCOSE, CAPILLARY - Abnormal; Notable for the following:    Glucose-Capillary 167 (*)    All other components within normal limits  GLUCOSE, CAPILLARY - Abnormal; Notable for the following:    Glucose-Capillary 141 (*)    All other components within normal limits  GLUCOSE, CAPILLARY - Abnormal; Notable for the following:    Glucose-Capillary 132 (*)  All other components within normal limits  GLUCOSE, CAPILLARY - Abnormal; Notable for the following:    Glucose-Capillary 143 (*)    All other components within normal limits  COMPREHENSIVE METABOLIC PANEL - Abnormal; Notable for the following:    Glucose, Bld 147 (*)    BUN 42 (*)    Creatinine, Ser 1.25 (*)    Albumin 3.0 (*)    Alkaline Phosphatase 123 (*)    GFR calc non Af Amer 40 (*)    GFR calc Af Amer 47 (*)    All other components within normal limits  PROTIME-INR - Abnormal; Notable for the following:    Prothrombin Time 20.3 (*)    INR 1.81 (*)    All other components within normal limits  GLUCOSE, CAPILLARY - Abnormal; Notable for the following:    Glucose-Capillary 133 (*)    All other components within normal limits  MRSA PCR SCREENING  CLOSTRIDIUM DIFFICILE BY PCR  CBC  PHOSPHORUS  MAGNESIUM  TROPONIN I  TROPONIN I  TROPONIN I  TSH  CBC  CBC  POCT I-STAT TROPONIN I   No results found.   1. Acute on chronic diastolic CHF (congestive heart failure)   2. Atrial fibrillation   3. CAD (coronary artery disease)    O2- 95%[place], abnormal  Cardiac: 50 sb, abnormal  Patient has episodes of bradycardia  while in the ED, though she does not describe new chest pain.   Date: 04/24/2012  Rate: 69  Rhythm: normal sinus rhythm  QRS Axis: right  Intervals: normal  ST/T Wave abnormalities: nonspecific ST/T changes  Conduction Disutrbances:nonspecific intraventricular conduction delay  Narrative Interpretation:   Old EKG Reviewed: none available  ABNORMAL   MDM  This elderly female with oxygen dependency at home, multiple medical problems now presents with worsening dyspnea.  On exam the patient is mentating well, she has wheezes, oxygen dependency, and there is concern for multifactorial progression of symptoms.  Patient's BNP was notably elevated, and given her oxygen dependence she was admitted for further evaluation and management.        Gerhard Munch, MD 04/24/12 [id number]

## 2012-04-27 ENCOUNTER — Other Ambulatory Visit (INDEPENDENT_AMBULATORY_CARE_PROVIDER_SITE_OTHER): Payer: Medicare PPO

## 2012-04-27 ENCOUNTER — Ambulatory Visit (INDEPENDENT_AMBULATORY_CARE_PROVIDER_SITE_OTHER): Payer: Medicare PPO | Admitting: Pharmacist

## 2012-04-27 DIAGNOSIS — Z7901 Long term (current) use of anticoagulants: Secondary | ICD-10-CM

## 2012-04-27 DIAGNOSIS — N189 Chronic kidney disease, unspecified: Secondary | ICD-10-CM

## 2012-04-27 LAB — BASIC METABOLIC PANEL
Calcium: 9.7 mg/dL (ref 8.4–10.5)
Potassium: 4.4 mEq/L (ref 3.5–5.3)
Sodium: 136 mEq/L (ref 135–145)

## 2012-04-27 LAB — POCT INR: INR: 3.2

## 2012-04-27 NOTE — Patient Instructions (Signed)
Patient instructed to take medications as defined in the Anti-coagulation Track section of this encounter.  Patient instructed to take today's dose.  Patient verbalized understanding of these instructions.    

## 2012-04-27 NOTE — Progress Notes (Signed)
Anti-Coagulation Progress Note  [name] is a 79 y.o. female who is currently on an anti-coagulation regimen.    RECENT RESULTS: Recent results are below, the most recent result is correlated with a dose of 40 mg. per week: Lab Results  Component Value Date   INR 3.20 04/27/2012   INR 1.81* 04/23/2012   INR 2.15* 04/21/2012    ANTI-COAG DOSE: Anticoagulation Dose Instructions as of 04/27/2012     Glynis Smiles Tue Wed Thu Fri Sat   New Dose 5 mg 5 mg 5 mg 5 mg 5 mg 5 mg 5 mg       ANTICOAG SUMMARY: Anticoagulation Episode Summary   Current INR goal 2.0-3.0  Next INR check 05/25/2012  INR from last check 3.20! (04/27/2012)  Weekly max dose   Target end date Indefinite  INR check location   Preferred lab   Send INR reminders to ANTICOAG IMP   Indications  Long-term (current) use of anticoagulants [V58.61] AF (atrial fibrillation) (Resolved) [427.31]        Comments       Anticoagulation Care Providers   Provider Role Specialty Phone number   Ulyess Mort, MD  Internal Medicine [phone]      ANTICOAG TODAY: Anticoagulation Summary as of 04/27/2012   INR goal 2.0-3.0  Selected INR 3.20! (04/27/2012)  Next INR check 05/25/2012  Target end date Indefinite   Indications  Long-term (current) use of anticoagulants [V58.61] AF (atrial fibrillation) (Resolved) [427.31]      Anticoagulation Episode Summary   INR check location    Preferred lab    Send INR reminders to ANTICOAG IMP   Comments     Anticoagulation Care Providers   Provider Role Specialty Phone number   Ulyess Mort, MD  Internal Medicine [phone]      PATIENT INSTRUCTIONS: Patient Instructions  Patient instructed to take medications as defined in the Anti-coagulation Track section of this encounter.  Patient instructed to take today's dose.  Patient verbalized understanding of these instructions.       FOLLOW-UP Return in 4 weeks (on 05/25/2012) for Follow up INR at 1100h.  Hulen Luster,  III Pharm.D., CACP

## 2012-04-29 ENCOUNTER — Telehealth: Payer: Self-pay | Admitting: Dietician

## 2012-04-29 NOTE — Telephone Encounter (Signed)
Discharge date: 04/23/12 Call date: 04-29-12 Hospital follow up appointment date: 05-12-12  Calling to assist with transition of care from hospital to home.   Discharge medications reviewed: only ones that were stopped- she confirmed that she is not taking them.  Able to fill all prescriptions? Yes Patient aware of hospital follow up appointments. She was not. Was reminded during this phone call.  No problems with transportation.NO Other problems/concerns: "urinating a lot", losing 1 # per day, asking about her kidneys, first good BM today. She was okay waiting until her appointment to discuss.

## 2012-05-06 ENCOUNTER — Telehealth: Payer: Self-pay | Admitting: *Deleted

## 2012-05-06 NOTE — Telephone Encounter (Signed)
I agree with the appointment. For tomorrow's visit, a couple notes:   The patient does have a history of gout and was on colchicine (which was prescribed for prophylaxis but the patient used it only acutely). Given the renal failure, I had reduced the dose of colchicine in her last refill (when I did not know that she used it only for acute flare), however, the patient has multiple other reasons for renal failure. As per the current situation, most likely she is having a gout flare, and if more than one joint is involved, then infection is certainly an unlikely possibility. Given the extent and severity of her disease, and most recent creatinine values from 04/27/12 (essentially normal), this patient can potentially be treated with prednisone, colchicine or NSAIDs (please double check for any other contraindications).  Of note: She has been hospitalized for CHF recently. Please follow disposition notes.   Please obtain a CMP. If prescribing colchicine, please prescribe only for a few days, and schedule a close follow up to monitor renal function. If renal function/creatinine clearance remains okay, then we can even consider colchicine prophylaxis at a low dose.   Once the acute attack is over, the patient will be started on prophylaxis and considered for urate lowering therapy.

## 2012-05-06 NOTE — Telephone Encounter (Signed)
Pt called with c/o swelling to left foot and toe.  Red and warm to touch.Onset 2 days ago.  She has had gout in past and feels that is what this is. She had been on colcrys but it was removed from med list. She is unable to come into clinic today, I offered appointment. Scheduled for tomorrow at 1:15.  Pt relies on [facility] aide to transport.  It's difficult for pt to get here. Hospital d/c 2/27 for CHF

## 2012-05-07 ENCOUNTER — Encounter: Payer: Self-pay | Admitting: Internal Medicine

## 2012-05-07 ENCOUNTER — Ambulatory Visit (INDEPENDENT_AMBULATORY_CARE_PROVIDER_SITE_OTHER): Payer: Medicare PPO | Admitting: Internal Medicine

## 2012-05-07 ENCOUNTER — Ambulatory Visit: Payer: Medicare PPO | Admitting: Internal Medicine

## 2012-05-07 VITALS — BP 110/70 | HR 80 | Temp 97.0°F | Ht 62.0 in

## 2012-05-07 DIAGNOSIS — I509 Heart failure, unspecified: Secondary | ICD-10-CM

## 2012-05-07 DIAGNOSIS — M109 Gout, unspecified: Secondary | ICD-10-CM

## 2012-05-07 MED ORDER — ALLOPURINOL 100 MG PO TABS: ORAL_TABLET | ORAL | Status: DC

## 2012-05-07 MED ORDER — COLCHICINE 0.6 MG PO TABS: 0.6000 mg | ORAL_TABLET | Freq: Two times a day (BID) | ORAL | Status: DC

## 2012-05-07 MED ORDER — PREDNISONE 5 MG PO TABS: ORAL_TABLET | ORAL | Status: DC

## 2012-05-07 NOTE — Patient Instructions (Addendum)
We will give you some prednisone for your gout flare. You will take 3 pills (all at once) one time per day for 3 days in a row. Then, you will take 2 pills (all at once) for the next 3 days. After that, take 1 pill per day for 3 days. Then stop taking the prednisone.  After the prednisone is gone and the flare is over start taking colchicine and allopurinol.  Take colchicine 1 pill two times per day. If you have diarrhea, only take 1 pill once per day.  Allopurinol take 1 pill once a day for the first week. Then take 1 pill two times per day after that until you return.  Come back as scheduled or in 4-6 weeks.   Please call with questions or problems and our number is [phone].

## 2012-05-07 NOTE — Progress Notes (Signed)
Subjective:     Patient ID: [name], female   DOB: 28-Aug-1932, 79 y.o.   MRN: [id number]  HPI The patient is a 79 YO female who comes in today for an acute visit for her foot that is hurting her. She states that she has a long history of gout and this particular flare started about 4 days ago. Initially it was not terrible but since has progressed to the point where putting on a shoe is painful and she is unable to have it touch things without excrutiating pain. She is not having fevers or chills. She states she is not taking anything but tylenol for her pain right now. She has been on allopurinol in the distant past and is not sure why it was stopped. She has flares fairly regularly. She can not identify any triggers for the flares. She does not use alcohol at all. They are worse in her left foot although she does have occasional flare in the right foot. The gout has never moved from her feet to other sites. She states she has started some therapy and feels a little more tired after sessions. She does use home oxygen and states that her breathing is stable although she is not able to weigh every day given some instability in standing. Also lately her foot pain has prevented this. However she does not feel that her clothes are tighter or her breathing has changed. She was advised to start rechecking her weight at therapy or as soon as she is more stable and not in pain. She was advised on the use of her medications and how to call the clinic if her breathing worsens or weight increases for advice.   Review of Systems  Constitutional: Positive for activity change. Negative for fever, chills, diaphoresis, appetite change, fatigue and unexpected weight change.  Respiratory: Positive for shortness of breath. Negative for cough, chest tightness and wheezing.        Some SOB at baseline, however is stable.  Cardiovascular: Negative for chest pain, palpitations and leg swelling.  Gastrointestinal:  Negative for nausea, vomiting, abdominal pain, diarrhea and constipation.  Musculoskeletal: Positive for joint swelling, arthralgias and gait problem. Negative for myalgias and back pain.       Left great toe swollen and red.  Skin: Positive for color change. Negative for pallor, rash and wound.  Neurological: Negative for dizziness, tremors, seizures, syncope, facial asymmetry, speech difficulty, weakness, light-headedness, numbness and headaches.       Objective:   Physical Exam  Constitutional: She is oriented to person, place, and time. She appears well-developed and well-nourished. She appears distressed.  In mild distress, obese. Wheelchair bound with oxygen therapy chronically.   HENT:  Head: Normocephalic and atraumatic.  Eyes: EOM are normal. Pupils are equal, round, and reactive to light.  Neck: Normal range of motion. Neck supple. No JVD present. No tracheal deviation present. No thyromegaly present.  Cardiovascular: Normal rate and normal heart sounds.   Pulmonary/Chest: Effort normal. No respiratory distress. She has no wheezes. She has no rales. She exhibits no tenderness.  Some diminished sounds, hard to auscultate well given body habitus.  Abdominal: Soft. Bowel sounds are normal. She exhibits no distension. There is no tenderness. There is no rebound.  Obese  Musculoskeletal: Normal range of motion. She exhibits tenderness. She exhibits no edema.  Tenderness in the left great toe and redness visible. Exquisitely tender to touch and even shoe is causing discomfort.   Lymphadenopathy:  She has no cervical adenopathy.  Neurological: She is alert and oriented to person, place, and time. No cranial nerve deficit.  Skin: Skin is warm and dry. No rash noted. She is not diaphoretic. There is erythema. No pallor.       Assessment:   1. Gout - The patient is in acute gout flare and will give prednisone taper for that. She does have significant and frequent exacerbations with  elevated uric acid (9.8) level at last visit. Will start allopurinol and colchicine after cessation of prednisone taper. She will need repeat uric acid level at next visit with PCP to guide dosing of allopurinol therapy. Goal uric acid <6.  2. Chronic heart failure - Weight and breathing appear stable and not in exacerbation. Think that she can wait 4 weeks until seeing PCP back and she was reluctant to return next week for appointment so canceled and moved to next month on April 15th.   3. Disposition - The patient will be put on taper of prednisone for her gout. After finishing taper (if symptoms gone) she will start colchicine 0.6 mg BID (decrease to once daily if diarrhea) and allopurinol 100 mg daily for 1 week then 100 mg BID. She will be seen back on April 15th with her PCP. No changes to her other medicines at this visit. She will need follow up uric acid level to help guide dosing of allopurinol.

## 2012-05-08 ENCOUNTER — Ambulatory Visit: Payer: Medicare PPO | Admitting: Internal Medicine

## 2012-05-11 ENCOUNTER — Encounter (HOSPITAL_COMMUNITY): Payer: Self-pay | Admitting: Emergency Medicine

## 2012-05-11 ENCOUNTER — Emergency Department (HOSPITAL_COMMUNITY): Payer: Medicare PPO

## 2012-05-11 ENCOUNTER — Inpatient Hospital Stay (HOSPITAL_COMMUNITY)
Admission: EM | Admit: 2012-05-11 | Discharge: 2012-05-14 | DRG: 193 | Disposition: A | Discharge status: Home Health | Payer: Medicare PPO | Attending: Internal Medicine | Admitting: Internal Medicine

## 2012-05-11 DIAGNOSIS — R7402 Elevation of levels of lactic acid dehydrogenase (LDH): Secondary | ICD-10-CM | POA: Diagnosis present

## 2012-05-11 DIAGNOSIS — I428 Other cardiomyopathies: Secondary | ICD-10-CM | POA: Diagnosis present

## 2012-05-11 DIAGNOSIS — R001 Bradycardia, unspecified: Secondary | ICD-10-CM

## 2012-05-11 DIAGNOSIS — R748 Abnormal levels of other serum enzymes: Secondary | ICD-10-CM

## 2012-05-11 DIAGNOSIS — R32 Unspecified urinary incontinence: Secondary | ICD-10-CM

## 2012-05-11 DIAGNOSIS — E890 Postprocedural hypothyroidism: Secondary | ICD-10-CM | POA: Diagnosis present

## 2012-05-11 DIAGNOSIS — E119 Type 2 diabetes mellitus without complications: Secondary | ICD-10-CM | POA: Diagnosis present

## 2012-05-11 DIAGNOSIS — Z7901 Long term (current) use of anticoagulants: Secondary | ICD-10-CM

## 2012-05-11 DIAGNOSIS — J449 Chronic obstructive pulmonary disease, unspecified: Secondary | ICD-10-CM | POA: Diagnosis present

## 2012-05-11 DIAGNOSIS — I2510 Atherosclerotic heart disease of native coronary artery without angina pectoris: Secondary | ICD-10-CM | POA: Diagnosis present

## 2012-05-11 DIAGNOSIS — E875 Hyperkalemia: Secondary | ICD-10-CM | POA: Diagnosis present

## 2012-05-11 DIAGNOSIS — Z6841 Body Mass Index (BMI) 40.0 and over, adult: Secondary | ICD-10-CM

## 2012-05-11 DIAGNOSIS — J441 Chronic obstructive pulmonary disease with (acute) exacerbation: Secondary | ICD-10-CM | POA: Diagnosis present

## 2012-05-11 DIAGNOSIS — I498 Other specified cardiac arrhythmias: Secondary | ICD-10-CM | POA: Diagnosis present

## 2012-05-11 DIAGNOSIS — J189 Pneumonia, unspecified organism: Principal | ICD-10-CM | POA: Diagnosis present

## 2012-05-11 DIAGNOSIS — I252 Old myocardial infarction: Secondary | ICD-10-CM

## 2012-05-11 DIAGNOSIS — Z87891 Personal history of nicotine dependence: Secondary | ICD-10-CM

## 2012-05-11 DIAGNOSIS — Z9981 Dependence on supplemental oxygen: Secondary | ICD-10-CM

## 2012-05-11 DIAGNOSIS — I5033 Acute on chronic diastolic (congestive) heart failure: Secondary | ICD-10-CM | POA: Diagnosis present

## 2012-05-11 DIAGNOSIS — R7401 Elevation of levels of liver transaminase levels: Secondary | ICD-10-CM | POA: Diagnosis present

## 2012-05-11 DIAGNOSIS — Z7982 Long term (current) use of aspirin: Secondary | ICD-10-CM

## 2012-05-11 DIAGNOSIS — M1990 Unspecified osteoarthritis, unspecified site: Secondary | ICD-10-CM | POA: Diagnosis present

## 2012-05-11 DIAGNOSIS — IMO0002 Reserved for concepts with insufficient information to code with codable children: Secondary | ICD-10-CM

## 2012-05-11 DIAGNOSIS — Z79899 Other long term (current) drug therapy: Secondary | ICD-10-CM

## 2012-05-11 DIAGNOSIS — F329 Major depressive disorder, single episode, unspecified: Secondary | ICD-10-CM | POA: Diagnosis present

## 2012-05-11 DIAGNOSIS — Z8673 Personal history of transient ischemic attack (TIA), and cerebral infarction without residual deficits: Secondary | ICD-10-CM

## 2012-05-11 DIAGNOSIS — M109 Gout, unspecified: Secondary | ICD-10-CM | POA: Diagnosis present

## 2012-05-11 DIAGNOSIS — Z9089 Acquired absence of other organs: Secondary | ICD-10-CM

## 2012-05-11 DIAGNOSIS — Z9884 Bariatric surgery status: Secondary | ICD-10-CM

## 2012-05-11 DIAGNOSIS — Z86718 Personal history of other venous thrombosis and embolism: Secondary | ICD-10-CM

## 2012-05-11 DIAGNOSIS — J4489 Other specified chronic obstructive pulmonary disease: Secondary | ICD-10-CM | POA: Diagnosis present

## 2012-05-11 DIAGNOSIS — R791 Abnormal coagulation profile: Secondary | ICD-10-CM

## 2012-05-11 DIAGNOSIS — I10 Essential (primary) hypertension: Secondary | ICD-10-CM | POA: Diagnosis present

## 2012-05-11 DIAGNOSIS — Z86711 Personal history of pulmonary embolism: Secondary | ICD-10-CM

## 2012-05-11 DIAGNOSIS — K219 Gastro-esophageal reflux disease without esophagitis: Secondary | ICD-10-CM | POA: Diagnosis present

## 2012-05-11 DIAGNOSIS — I4891 Unspecified atrial fibrillation: Secondary | ICD-10-CM | POA: Diagnosis present

## 2012-05-11 DIAGNOSIS — E039 Hypothyroidism, unspecified: Secondary | ICD-10-CM

## 2012-05-11 DIAGNOSIS — J9610 Chronic respiratory failure, unspecified whether with hypoxia or hypercapnia: Secondary | ICD-10-CM | POA: Diagnosis present

## 2012-05-11 DIAGNOSIS — Z888 Allergy status to other drugs, medicaments and biological substances status: Secondary | ICD-10-CM

## 2012-05-11 DIAGNOSIS — I509 Heart failure, unspecified: Secondary | ICD-10-CM | POA: Diagnosis present

## 2012-05-11 DIAGNOSIS — E785 Hyperlipidemia, unspecified: Secondary | ICD-10-CM | POA: Diagnosis present

## 2012-05-11 DIAGNOSIS — F3289 Other specified depressive episodes: Secondary | ICD-10-CM | POA: Diagnosis present

## 2012-05-11 DIAGNOSIS — R945 Abnormal results of liver function studies: Secondary | ICD-10-CM | POA: Diagnosis present

## 2012-05-11 LAB — CBC WITH DIFFERENTIAL/PLATELET
Basophils Absolute: 0 10*3/uL (ref 0.0–0.1)
HCT: 38.4 % (ref 36.0–46.0)
Hemoglobin: 12.6 g/dL (ref 12.0–15.0)
Lymphocytes Relative: 15 % (ref 12–46)
Monocytes Absolute: 0.4 10*3/uL (ref 0.1–1.0)
Monocytes Relative: 5 % (ref 3–12)
Neutro Abs: 7.1 10*3/uL (ref 1.7–7.7)
Neutrophils Relative %: 79 % — ABNORMAL HIGH (ref 43–77)
RDW: 13.5 % (ref 11.5–15.5)
WBC: 9.1 10*3/uL (ref 4.0–10.5)

## 2012-05-11 LAB — COMPREHENSIVE METABOLIC PANEL
AST: 66 U/L — ABNORMAL HIGH (ref 0–37)
Albumin: 3.3 g/dL — ABNORMAL LOW (ref 3.5–5.2)
Alkaline Phosphatase: 132 U/L — ABNORMAL HIGH (ref 39–117)
CO2: 31 mEq/L (ref 19–32)
Chloride: 99 mEq/L (ref 96–112)
Creatinine, Ser: 0.99 mg/dL (ref 0.50–1.10)
GFR calc non Af Amer: 53 mL/min — ABNORMAL LOW (ref >90)
Potassium: 5.4 mEq/L — ABNORMAL HIGH (ref 3.5–5.1)
Total Bilirubin: 0.4 mg/dL (ref 0.3–1.2)

## 2012-05-11 LAB — PROTIME-INR: Prothrombin Time: 49.5 seconds — ABNORMAL HIGH (ref 11.6–15.2)

## 2012-05-11 MED ORDER — IPRATROPIUM BROMIDE 0.02 % IN SOLN
0.5000 mg | RESPIRATORY_TRACT | Status: DC
  Administered 2012-05-12 (×2): 0.5 mg via RESPIRATORY_TRACT
  Filled 2012-05-11 (×4): qty 2.5

## 2012-05-11 MED ORDER — ASPIRIN EC 81 MG PO TBEC: 81.0000 mg | DELAYED_RELEASE_TABLET | Freq: Every day | ORAL | Status: DC

## 2012-05-11 MED ORDER — MOMETASONE FURO-FORMOTEROL FUM 100-5 MCG/ACT IN AERO
2.0000 | INHALATION_SPRAY | Freq: Two times a day (BID) | RESPIRATORY_TRACT | Status: DC
  Administered 2012-05-12 – 2012-05-13 (×5): 2 via RESPIRATORY_TRACT
  Filled 2012-05-11: qty 8.8

## 2012-05-11 MED ORDER — ATORVASTATIN CALCIUM 40 MG PO TABS
40.0000 mg | ORAL_TABLET | Freq: Every day | ORAL | Status: DC
  Administered 2012-05-12 – 2012-05-13 (×2): 40 mg via ORAL
  Filled 2012-05-11 (×4): qty 1

## 2012-05-11 MED ORDER — ALBUTEROL SULFATE (5 MG/ML) 0.5% IN NEBU
2.5000 mg | INHALATION_SOLUTION | RESPIRATORY_TRACT | Status: DC
  Administered 2012-05-12 (×2): 2.5 mg via RESPIRATORY_TRACT
  Filled 2012-05-11 (×4): qty 0.5

## 2012-05-11 MED ORDER — ALBUTEROL SULFATE (5 MG/ML) 0.5% IN NEBU
5.0000 mg | INHALATION_SOLUTION | Freq: Once | RESPIRATORY_TRACT | Status: AC
  Administered 2012-05-11: 5 mg via RESPIRATORY_TRACT
  Filled 2012-05-11: qty 1

## 2012-05-11 MED ORDER — INSULIN ASPART 100 UNIT/ML ~~LOC~~ SOLN
0.0000 [IU] | SUBCUTANEOUS | Status: DC
  Administered 2012-05-12: 2 [IU] via SUBCUTANEOUS
  Administered 2012-05-12: 3 [IU] via SUBCUTANEOUS
  Administered 2012-05-12: 5 [IU] via SUBCUTANEOUS
  Administered 2012-05-12 (×2): 3 [IU] via SUBCUTANEOUS
  Administered 2012-05-12: 2 [IU] via SUBCUTANEOUS
  Administered 2012-05-13: 3 [IU] via SUBCUTANEOUS
  Administered 2012-05-13: 2 [IU] via SUBCUTANEOUS

## 2012-05-11 MED ORDER — ACETAMINOPHEN 325 MG PO TABS: 650.0000 mg | ORAL_TABLET | Freq: Four times a day (QID) | ORAL | Status: DC | PRN

## 2012-05-11 MED ORDER — ONDANSETRON HCL 4 MG PO TABS: 4.0000 mg | ORAL_TABLET | Freq: Four times a day (QID) | ORAL | Status: DC | PRN

## 2012-05-11 MED ORDER — AMLODIPINE BESYLATE 5 MG PO TABS
5.0000 mg | ORAL_TABLET | Freq: Every day | ORAL | Status: DC
  Administered 2012-05-12 – 2012-05-14 (×3): 5 mg via ORAL
  Filled 2012-05-11 (×3): qty 1

## 2012-05-11 MED ORDER — ACETAMINOPHEN 650 MG RE SUPP: 650.0000 mg | Freq: Four times a day (QID) | RECTAL | Status: DC | PRN

## 2012-05-11 MED ORDER — ONDANSETRON HCL 4 MG/2ML IJ SOLN: 4.0000 mg | Freq: Four times a day (QID) | INTRAMUSCULAR | Status: DC | PRN

## 2012-05-11 MED ORDER — HEPARIN SODIUM (PORCINE) 5000 UNIT/ML IJ SOLN
5000.0000 [IU] | Freq: Three times a day (TID) | INTRAMUSCULAR | Status: DC
  Administered 2012-05-11 – 2012-05-12 (×3): 5000 [IU] via SUBCUTANEOUS
  Filled 2012-05-11 (×5): qty 1

## 2012-05-11 MED ORDER — METHYLPREDNISOLONE SODIUM SUCC 125 MG IJ SOLR
60.0000 mg | Freq: Two times a day (BID) | INTRAMUSCULAR | Status: DC
  Administered 2012-05-12: 60 mg via INTRAVENOUS
  Filled 2012-05-11 (×3): qty 0.96

## 2012-05-11 MED ORDER — MONTELUKAST SODIUM 10 MG PO TABS
10.0000 mg | ORAL_TABLET | Freq: Every day | ORAL | Status: DC
  Administered 2012-05-11 – 2012-05-13 (×3): 10 mg via ORAL
  Filled 2012-05-11 (×4): qty 1

## 2012-05-11 MED ORDER — GABAPENTIN 100 MG PO CAPS
200.0000 mg | ORAL_CAPSULE | Freq: Every day | ORAL | Status: DC
  Administered 2012-05-11 – 2012-05-13 (×3): 200 mg via ORAL
  Filled 2012-05-11 (×4): qty 2

## 2012-05-11 MED ORDER — FLUOXETINE HCL 20 MG PO TABS
20.0000 mg | ORAL_TABLET | Freq: Two times a day (BID) | ORAL | Status: DC
  Administered 2012-05-12 – 2012-05-14 (×5): 20 mg via ORAL
  Filled 2012-05-11 (×7): qty 1

## 2012-05-11 MED ORDER — LEVOTHYROXINE SODIUM 150 MCG PO TABS
150.0000 ug | ORAL_TABLET | Freq: Every day | ORAL | Status: DC
  Administered 2012-05-12 – 2012-05-14 (×3): 150 ug via ORAL
  Filled 2012-05-11 (×4): qty 1

## 2012-05-11 MED ORDER — IPRATROPIUM BROMIDE 0.02 % IN SOLN
0.5000 mg | Freq: Once | RESPIRATORY_TRACT | Status: AC
  Administered 2012-05-11: 0.5 mg via RESPIRATORY_TRACT
  Filled 2012-05-11: qty 2.5

## 2012-05-11 MED ORDER — MORPHINE SULFATE 2 MG/ML IJ SOLN: 2.0000 mg | INTRAMUSCULAR | Status: DC | PRN

## 2012-05-11 MED ORDER — ASPIRIN EC 81 MG PO TBEC
81.0000 mg | DELAYED_RELEASE_TABLET | Freq: Every day | ORAL | Status: DC
  Administered 2012-05-12 – 2012-05-14 (×3): 81 mg via ORAL
  Filled 2012-05-11 (×3): qty 1

## 2012-05-11 MED ORDER — FUROSEMIDE 40 MG PO TABS
60.0000 mg | ORAL_TABLET | Freq: Two times a day (BID) | ORAL | Status: DC
  Administered 2012-05-12 – 2012-05-14 (×5): 60 mg via ORAL
  Filled 2012-05-11 (×7): qty 1

## 2012-05-11 MED ORDER — METHYLPREDNISOLONE SODIUM SUCC 125 MG IJ SOLR
125.0000 mg | Freq: Once | INTRAMUSCULAR | Status: AC
  Administered 2012-05-11: 125 mg via INTRAVENOUS
  Filled 2012-05-11: qty 2

## 2012-05-11 MED ORDER — ALBUTEROL SULFATE HFA 108 (90 BASE) MCG/ACT IN AERS: 2.0000 | INHALATION_SPRAY | Freq: Four times a day (QID) | RESPIRATORY_TRACT | Status: DC | PRN

## 2012-05-11 MED ORDER — ALLOPURINOL 100 MG PO TABS
100.0000 mg | ORAL_TABLET | Freq: Two times a day (BID) | ORAL | Status: DC
  Administered 2012-05-12 – 2012-05-14 (×5): 100 mg via ORAL
  Filled 2012-05-11 (×6): qty 1

## 2012-05-11 MED ORDER — PANTOPRAZOLE SODIUM 40 MG PO TBEC
40.0000 mg | DELAYED_RELEASE_TABLET | Freq: Every day | ORAL | Status: DC
  Administered 2012-05-12 – 2012-05-14 (×3): 40 mg via ORAL
  Filled 2012-05-11 (×2): qty 1

## 2012-05-11 MED ORDER — TIOTROPIUM BROMIDE MONOHYDRATE 18 MCG IN CAPS
18.0000 ug | ORAL_CAPSULE | Freq: Every day | RESPIRATORY_TRACT | Status: DC
  Administered 2012-05-12 – 2012-05-13 (×2): 18 ug via RESPIRATORY_TRACT
  Filled 2012-05-11: qty 5

## 2012-05-11 MED ORDER — ALUM & MAG HYDROXIDE-SIMETH 200-200-20 MG/5ML PO SUSP: 30.0000 mL | Freq: Four times a day (QID) | ORAL | Status: DC | PRN

## 2012-05-11 NOTE — ED Notes (Signed)
History of shortness of breath and worsening overtime with dry cough and recently discharge from hospital for CHF.

## 2012-05-11 NOTE — ED Notes (Signed)
Pt resting INAD, RN introduce self to pt with plan of care update. Pt denies any pain or shortness of breath, on on cardiac monitor and oxygen via [place] 3L. Pt has expiratory wheezing in all fields, resp e/u and will continue to monitor pt.

## 2012-05-11 NOTE — ED Provider Notes (Signed)
History     CSN: [id number]  Arrival date & time 05/11/12  1801   First MD Initiated Contact with Patient 05/11/12 1826      Chief Complaint  Patient presents with  . Shortness of Breath    (Consider location/radiation/quality/duration/timing/severity/associated sxs/prior treatment) HPI  Pt presents to the ED by EMS as recommended by her PCP office Dr. Mayford Knife. Patient says her PCP had lab results from a few days ago and that they are "off". She said that she was walking her walker and trying to get to a chair to sit down when the office called. They were concerned about her being short of breath on the phone. She says that she uses 2 L [place] oxygen at home because she has a hx of heart failure. She also admits to staying chronically in afib and takes medication to control her rate. She also has a PMH for depression, CAD, gout, morbid obesity, diabetes, PE, stroke, MI.   Past Medical History  Diagnosis Date  . CAD (coronary artery disease)     s/p remote MI (1993)  . Abdominal pain     recent admission, likely secondary to presbyesophagus and gastric dysmotility  . CVD (cardiovascular disease)   . Chronic diastolic heart failure     with dilated cardiomyopathy, last EF 55%(09/2006)  . Respiratory failure, chronic     mixed etiology with bronchospastic component  . Urinary incontinence     recurrent uti's/resistance to cipro, bactrim  . Gout   . Postablative hypothyroidism     H/o Graves disease s/p radioactive iodine ablation with resultant postablative hypothyroidism  . Hyperlipidemia   . Depression   . Morbidly obese     s/p gastric plication surgery  . Gastroesophageal reflux disease   . Ventral hernia     repair in April 2008, complicated by MRSA abdominal wall cellulitis  . DVT (deep venous thrombosis) 1998  . Pulmonary embolism 1998    1998, s/p Greenfield filter  . Diabetes mellitus type 2, controlled, with complications   . Hypertension   . History of recurrent UTIs      and history of pyelonephritis  . AF (atrial fibrillation)     on chronic warfarin  . Osteoarthritis   . Myocardial infarction   . Stroke 1997    denies residual  . Anemia     Blood transfusion [V58.2]    Past Surgical History  Procedure Laterality Date  . Cholecystectomy    . Appendectomy    . Breast biopsy    . Gastric bypass  1970's  . Hernia repair  05/2006    ventral hernia  . Greenfield filter placement  1998    Family History  Problem Relation Age of Onset  . Colon cancer Neg Hx     History  Substance Use Topics  . Smoking status: Former Smoker -- 1.00 packs/day for 1.5 years    Types: Cigarettes  . Smokeless tobacco: Never Used  . Alcohol Use: No    OB History   Grav Para Term Preterm Abortions TAB SAB Ect Mult Living                  Review of Systems  All other systems reviewed and are negative.    Allergies  Nitrofurantoin  Home Medications   Current Outpatient Rx  Name  Route  Sig  Dispense  Refill  . acetaminophen (TYLENOL) 500 MG tablet   Oral   Take 1,000 mg by mouth 2 (two)  times daily as needed. For pain.         Marland Kitchen albuterol (PROVENTIL HFA) 108 (90 BASE) MCG/ACT inhaler   Inhalation   Inhale 2 puffs into the lungs every 6 (six) hours as needed. For wheezing.   3.7 g   11   . allopurinol (ZYLOPRIM) 100 MG tablet   Oral   Take 100 mg by mouth 2 (two) times daily.         Marland Kitchen amLODipine (NORVASC) 5 MG tablet   Oral   Take 5 mg by mouth daily.         Marland Kitchen aspirin EC 81 MG EC tablet   Oral   Take 1 tablet (81 mg total) by mouth daily.         Marland Kitchen atorvastatin (LIPITOR) 40 MG tablet      TAKE ONE TABLET BY MOUTH EVERY DAY   30 tablet   4   . FLUoxetine (PROZAC) 20 MG capsule   Oral   Take 20 mg by mouth 2 (two) times daily.         . Fluticasone-Salmeterol (ADVAIR) 250-50 MCG/DOSE AEPB   Inhalation   Inhale 1 puff into the lungs every 12 (twelve) hours.   60 each   3   . furosemide (LASIX) 20 MG tablet    Oral   Take 2 tablets (40 mg total) by mouth 2 (two) times daily.   120 tablet   1   . gabapentin (NEURONTIN) 100 MG capsule      TAKE TWO CAPSULES BY MOUTH TWICE DAILY   120 capsule   2   . glipiZIDE (GLUCOTROL) 10 MG tablet   Oral   Take 10 mg by mouth 2 (two) times daily before a meal.         . levothyroxine (SYNTHROID) 150 MCG tablet   Oral   Take 1 tablet (150 mcg total) by mouth daily.   30 tablet   11   . loperamide (IMODIUM) 2 MG capsule   Oral   Take 1 capsule (2 mg total) by mouth as needed for diarrhea or loose stools (up to total 16mg  daily).   8 capsule   0   . losartan (COZAAR) 50 MG tablet      TAKE ONE TABLET BY MOUTH TWICE DAILY   60 tablet   4   . montelukast (SINGULAIR) 10 MG tablet      TAKE ONE TABLET BY MOUTH EVERY DAY   30 tablet   6   . omeprazole (PRILOSEC) 20 MG capsule   Oral   Take 40 mg by mouth 2 (two) times daily.         . predniSONE (DELTASONE) 5 MG tablet      Take 3 pills (all at once) for 3 days, then take 2 pills per day for 3 days, then take 1 pill per day for 3 days. Then stop.   18 tablet   0   . tiotropium (SPIRIVA HANDIHALER) 18 MCG inhalation capsule   Inhalation   Place 1 capsule (18 mcg total) into inhaler and inhale daily.   30 capsule   4   . warfarin (COUMADIN) 5 MG tablet   Oral   Take 5 mg by mouth every evening.           Temp(Src) 98.6 F (37 C) (Oral)  Resp 20  SpO2 97%  LMP 05/22/1968  Physical Exam  Nursing note and vitals reviewed. Constitutional: She appears well-developed and well-nourished.  No distress.  Obese  HENT:  Head: Normocephalic and atraumatic.  Eyes: Pupils are equal, round, and reactive to light.  Neck: Normal range of motion. Neck supple.  Cardiovascular: Normal rate and regular rhythm.   Pulmonary/Chest: She is in respiratory distress. She has wheezes. She has rales.  Pt on 2L [place]. Normal effort of breathing  Abdominal: Soft. She exhibits distension. There is  tenderness.  Musculoskeletal:       Right shoulder: She exhibits no swelling, no effusion, no pain, no spasm and normal pulse.  Neurological: She is alert.  Skin: Skin is warm and dry.    ED Course  Procedures (including critical care time)  Labs Reviewed  CBC WITH DIFFERENTIAL - Abnormal; Notable for the following:    Neutrophils Relative 79 (*)    All other components within normal limits  COMPREHENSIVE METABOLIC PANEL - Abnormal; Notable for the following:    Potassium 5.4 (*)    Glucose, Bld 205 (*)    BUN 28 (*)    Albumin 3.3 (*)    AST 66 (*)    ALT 81 (*)    Alkaline Phosphatase 132 (*)    GFR calc non Af Amer 53 (*)    GFR calc Af Amer 62 (*)    All other components within normal limits  PRO B NATRIURETIC PEPTIDE - Abnormal; Notable for the following:    Pro B Natriuretic peptide (BNP) 1440.0 (*)    All other components within normal limits  PROTIME-INR   Dg Chest 2 View  05/11/2012  *RADIOLOGY REPORT*  Clinical Data: 79 year old female shortness of breath atrial fibrillation.  CHEST - 2 VIEW  Comparison: 04/20/2012 and earlier.  Findings: AP and lateral views of the chest.  Stable low lung volumes. Stable cardiomegaly and mediastinal contours.  No pneumothorax, pulmonary edema, pleural effusion or acute pulmonary opacity. Stable visualized osseous structures.  Numerous upper abdominal surgical clips.  IMPRESSION: No acute cardiopulmonary abnormality.   Original Report Authenticated By: Erskine Speed, M.D.      1. Hyperkalemia   2. Congestive heart failure (CHF)   3. Diabetes   4. Elevated liver enzymes       MDM  No significant abnormalities noted. Pt said advised by her PCP to come to ED for abnormal labs and  SOB. She is not having any symptoms at this time.      Date: 05/11/2012  Rate: 59  Rhythm: pacemaker spikes or artifacts  QRS Axis: normal  Intervals: normal  ST/T Wave abnormalities: normal  Conduction Disutrbances:atrial fibrillation  Narrative  Interpretation:    Old EKG Reviewed: unchanged   Dr. Ranae Palms saw patient and feels that she is wheezy and needs breathing treatments and solumedrol. She is also mildly hyperkalemic.  Will admit to Internal Medicine residents.  Patient admitted to Internal Medicine Residents     Dorthula Matas, PA-C 05/12/12 2303

## 2012-05-11 NOTE — ED Notes (Addendum)
Received lab report from Zollie Beckers (Designer, industrial/product): critical INR=6.02 and PT=49.5; reported to Marlon Pel, PA and Audelia Hives, RN

## 2012-05-11 NOTE — H&P (Signed)
Hospital Admission Note Date: 05/11/2012  Patient name: [name] Medical record number: [id number] Date of birth: Feb 19, 1933 Age: 79 y.o. Gender: female PCP: Aletta Edouard, MD  Medical Service: Internal Medicine Teaching Service   Attending physician:  Dr. Meredith Pel    1st Contact: Dr. Garald Braver   Pager: [phone] 2nd Contact: Dr. Youlanda Roys    Pager: [phone] After 5 pm or weekends: 1st Contact:      Pager: [phone] 2nd Contact:      Pager: [phone]  Chief Complaint: Wheezing, mild dyspnea  History of Present Illness: 79yo F with PMH HTN, HLD, DM II, CAD (MI '93), CVA '97, hyperkalemia, hypothyroidism, and CHF on 2L [place] at home presents to the ED per request of her Cardiologist's nurse due to wheezing and dyspnea heard over the phone. The pt states that she was exerting herself when her Cardiologist's office called; they were concerned that she was SOB, and told her to come to the ED. She used her Combivent inhaler, which relieved the wheezing, but she came to the ED anyway via EMS. She states that she was using a Spiriva inhaler but no longer uses it, but she is on Advair and Singulair. The patient denies any change in her breathing, and states that she "was feeling pretty good."  She denies any sick contacts, chest pain, N/V. She was recently seen in [facility] and treated for a gout flare with a prednisone taper, which ended today.   Meds: Current Outpatient Rx  Name  Route  Sig  Dispense  Refill  . acetaminophen (TYLENOL) 500 MG tablet   Oral   Take 1,000 mg by mouth 2 (two) times daily as needed. For pain.         Marland Kitchen albuterol (PROVENTIL HFA) 108 (90 BASE) MCG/ACT inhaler   Inhalation   Inhale 2 puffs into the lungs every 6 (six) hours as needed. For wheezing.   3.7 g   11   . allopurinol (ZYLOPRIM) 100 MG tablet   Oral   Take 100 mg by mouth 2 (two) times daily.         Marland Kitchen amLODipine (NORVASC) 5 MG tablet   Oral   Take 5 mg by mouth daily.         Marland Kitchen aspirin EC 81 MG EC  tablet   Oral   Take 1 tablet (81 mg total) by mouth daily.         Marland Kitchen atorvastatin (LIPITOR) 40 MG tablet      TAKE ONE TABLET BY MOUTH EVERY DAY   30 tablet   4   . FLUoxetine (PROZAC) 20 MG capsule   Oral   Take 20 mg by mouth 2 (two) times daily.         . Fluticasone-Salmeterol (ADVAIR) 250-50 MCG/DOSE AEPB   Inhalation   Inhale 1 puff into the lungs every 12 (twelve) hours.   60 each   3   . furosemide (LASIX) 20 MG tablet   Oral   Take 2 tablets (40 mg total) by mouth 2 (two) times daily.   120 tablet   1   . gabapentin (NEURONTIN) 100 MG capsule      TAKE TWO CAPSULES BY MOUTH TWICE DAILY   120 capsule   2   . glipiZIDE (GLUCOTROL) 10 MG tablet   Oral   Take 10 mg by mouth 2 (two) times daily before a meal.         . levothyroxine (SYNTHROID) 150 MCG tablet  Oral   Take 1 tablet (150 mcg total) by mouth daily.   30 tablet   11   . loperamide (IMODIUM) 2 MG capsule   Oral   Take 1 capsule (2 mg total) by mouth as needed for diarrhea or loose stools (up to total 16mg  daily).   8 capsule   0   . losartan (COZAAR) 50 MG tablet      TAKE ONE TABLET BY MOUTH TWICE DAILY   60 tablet   4   . montelukast (SINGULAIR) 10 MG tablet      TAKE ONE TABLET BY MOUTH EVERY DAY   30 tablet   6   . omeprazole (PRILOSEC) 20 MG capsule   Oral   Take 40 mg by mouth 2 (two) times daily.         . predniSONE (DELTASONE) 5 MG tablet      Take 3 pills (all at once) for 3 days, then take 2 pills per day for 3 days, then take 1 pill per day for 3 days. Then stop.   18 tablet   0   . tiotropium (SPIRIVA HANDIHALER) 18 MCG inhalation capsule   Inhalation   Place 1 capsule (18 mcg total) into inhaler and inhale daily.   30 capsule   4   . warfarin (COUMADIN) 5 MG tablet   Oral   Take 5 mg by mouth every evening.           Allergies: Allergies as of 05/11/2012 - Review Complete 05/11/2012  Allergen Reaction Noted  . Nitrofurantoin (macrodantin)  Itching 04/20/2010   Past Medical History  Diagnosis Date  . CAD (coronary artery disease)     s/p remote MI (1993)  . Abdominal pain     recent admission, likely secondary to presbyesophagus and gastric dysmotility  . CVD (cardiovascular disease)   . Chronic diastolic heart failure     with dilated cardiomyopathy, last EF 55%(09/2006)  . Respiratory failure, chronic     mixed etiology with bronchospastic component  . Urinary incontinence     recurrent uti's/resistance to cipro, bactrim  . Gout   . Postablative hypothyroidism     H/o Graves disease s/p radioactive iodine ablation with resultant postablative hypothyroidism  . Hyperlipidemia   . Depression   . Morbidly obese     s/p gastric plication surgery  . Gastroesophageal reflux disease   . Ventral hernia     repair in April 2008, complicated by MRSA abdominal wall cellulitis  . DVT (deep venous thrombosis) 1998  . Pulmonary embolism 1998    1998, s/p Greenfield filter  . Diabetes mellitus type 2, controlled, with complications   . Hypertension   . History of recurrent UTIs     and history of pyelonephritis  . AF (atrial fibrillation)     on chronic warfarin  . Osteoarthritis   . Myocardial infarction   . Stroke 1997    denies residual  . Anemia     Blood transfusion [V58.2]   Past Surgical History  Procedure Laterality Date  . Cholecystectomy    . Appendectomy    . Breast biopsy    . Gastric bypass  1970's  . Hernia repair  05/2006    ventral hernia  . Greenfield filter placement  1998   Family History  Problem Relation Age of Onset  . Colon cancer Neg Hx    History   Social History  . Marital Status: Widowed    Spouse Name: N/A  Number of Children: N/A  . Years of Education: N/A   Occupational History  . Not on file.   Social History Main Topics  . Smoking status: Former Smoker -- 1.00 packs/day for 1.5 years    Types: Cigarettes  . Smokeless tobacco: Never Used  . Alcohol Use: No  . Drug  Use: No  . Sexually Active: No   Other Topics Concern  . Not on file   Social History Narrative   She lives w/her son. Her son offers help and she has an aid who occasionally cooks for her and takes care of some other tasks around the pt's house.   Has medicare. Has son in [place] who is POA   Ambivalent about DNR issues and does not want a STOP sign posted in home          Review of Systems: A 10 point ROS was performed; pertinent positives and negatives were noted in the HPI  Physical Exam: Blood pressure 154/48, pulse 54, temperature 98.6 F (37 C), temperature source Oral, resp. rate 15, last menstrual period 05/22/1968, SpO2 98.00%. General: Alert, well-developed, and cooperative on examination.  Head: Normocephalic and atraumatic.  Eyes: Pupils equal, round, and reactive to light, no injection and anicteric.  Mouth: Pharynx pink and moist.  Neck: Supple, full ROM Lungs: Mild end expirtatory wheezes R>L, appears to have increased work of breathing (pt states that this is her baseline). Heart: Regular rate, regular rhythm, no murmur, no gallop, and no rub.  Abdomen: Soft, non-tender, non-distended Msk: No joint swelling, warmth, or erythema.  Extremities: 2+ radial and DP pulses bilaterally. BLE with minimal trace pitting edema. Neurologic: Alert & oriented X3, non focal Skin: Turgor normal and no rashes.  Psych: Normal mood and affect   Lab results: Basic Metabolic Panel:  Recent Labs  16/10/96 1820  NA 137  K 5.4*  CL 99  CO2 31  GLUCOSE 205*  BUN 28*  CREATININE 0.99  CALCIUM 10.0   Liver Function Tests:  Recent Labs  05/11/12 1820  AST 66*  ALT 81*  ALKPHOS 132*  BILITOT 0.4  PROT 7.6  ALBUMIN 3.3*   CBC:  Recent Labs  05/11/12 1820  WBC 9.1  NEUTROABS 7.1  HGB 12.6  HCT 38.4  MCV 93.9  PLT 320   BNP:  Recent Labs  05/11/12 1821  PROBNP 1440.0*    Coagulation:  Recent Labs  05/11/12 1948  LABPROT 49.5*  INR 6.02*   Imaging  results:  Dg Chest 2 View  05/11/2012  *RADIOLOGY REPORT*  Clinical Data: 79 year old female shortness of breath atrial fibrillation.  CHEST - 2 VIEW  Comparison: 04/20/2012 and earlier.  Findings: AP and lateral views of the chest.  Stable low lung volumes. Stable cardiomegaly and mediastinal contours.  No pneumothorax, pulmonary edema, pleural effusion or acute pulmonary opacity. Stable visualized osseous structures.  Numerous upper abdominal surgical clips.  IMPRESSION: No acute cardiopulmonary abnormality.   Original Report Authenticated By: Erskine Speed, M.D.     Other results: EKG: Atrial fibrillation, left axis deviation, unchanged from previous  Assessment & Plan by Problem: 79yo F with PMH HTN, HLD, DM II, CAD (MI '93), CVA '97, hyperkalemia, hypothyroidism, and CHF on 2L [place] at home presents to the ED per request of her Cardiologist's nurse due to wheezing heard over the phone.   1. COPD vs CHF exacerbation vs viral respiratory illness: Pt states wheezing and mild dyspnea began after exerting herself prior to the phone call  from her Cardiologist's office. She denies any worsening of her respiratory status. She does have end-expiratory wheezes R>L on exam, which she says is improved with her inhaler and the breathing treatment given in the ED. However, she is just finishing up a course of prednisone, so she really should not be having respiratory symptoms. An ECHO was performed during her last admission 03/2012, which showed EF 55%, normal diastolic function, focal basal hypertrophy of septum, unable to assess LV wall motion. On admission, her CXR showed no acute process.Her proBNP is almost half of what it was at her last admission, so this is likely not a CHF exacerbation. She denies sick contacts, but this could be viral. Possible this is a COPD exacerbation vs CHF exacebation vs viral illness, and will treat as thus. - Admit to IMTS, tele bed - Solumedrol 125mg  IV q12 - Duonebs q4h -  Holding Losartan - Increasing Lasix to 60 po BID - Continuing Singulair - Continuing Spiriva - Morphine 2mg  IV q4h PRN pain  2. Transaminitis: AST and ALT both elevated. Previously normal in February. She just completed a course of Prednisone, which can cause elevated AST, ALT, and alkaline phosphatase. For now, we will assume that the prednisone is the culpret. - AM CMP  3. Atrial fibrillation: Patient is on warfarin for atrial fibrillation, 5mg  po daily. She had previously taken diltiazem for rate control, now not on any rate-controlling medications in setting of bradycardia (HR 50s). Bradycardia in absence of nodal blocking agent suggests poorer compensation for atrial fibrillation. On admission, she appears to be in sinus rhythm during examination. Unfortunately her INR is 6. Follows w Dr. Alexandria Lodge as outpatient.  - Holding Warfarin   3. CAD, nonobstructive: Patient has history of prior MI. In 2006, cath was performed which showed 30-40% stenosis of circumflex, no interventions. Not on a beta blocker due to bradycardia.  - Continuing statin  - Continuing ASA 81mg    4. Chronic lung disease, obstructive/restrictive pattern: Patient has obstructive and restrictive pattern of lung disease. Currently on albulterol inhaler, advair, and singulair at home. She states that she is no longer using the Spiriva. - Plan as above in #1  5. Hyperkalemia:  Potassium 5.4 in the ED, no EKG changes. No longer on spironolactone. Should decrease with diuresis. - AM CMP  6. HTN: Patient's BP 154/48 in ED. On Lasix, Norvasc, and Losartan at home. Spironolactone and her beta blocker were held after last admission 03/2012.  - Increasing lasix, as noted above in #1  - Continuing amlodipine - Holding ARB  7. Diabetes: Last HbA1c 7.0 early last month. On glipizide at home. - Holding glipizide - SSI while inpatient   8. Gout: Recently treated for gout flare with prednisone taper. Still c/o pain. Uric acid not at  goal (9.8 as of 03/31/12). Per notes from last visit, plan was to start colchicine and allopurinol after finishing the prednisone taper. She was to be seen in clinic on 3/18 but rescheduled her appointment. - Rechecking serum uric acid levels - Allopurinol 100mg  po BID  9. Depression: Stable.  - Continue fluoxetine   10. GERD: Stable. On omeprazole at home. - Protonix 40mg  po daily  11. Hypothyrodism: Synthroid dose increased on 03/31/12 from 125 to 150 mcg daily. Last TSH 1.277 on 2/24.  - Synthroid po daily  12. HLD: Last lipid panel with elevated triglycerides to 192 and low HDL to 36, otherwise normal. She is on Lipitor at home, which was continued on admission. -  Lipitor 40mg  po daily  13. DVT PPx: Heparin  Dispo: Disposition is deferred at this time, awaiting improvement of current medical problems. Anticipated discharge in approximately 1-2 day(s).   The patient does have a current PCP (BHARDWAJ, SHILPA, MD), therefore will be requiring OPC follow-up after discharge.   The patient does not have transportation limitations that hinder transportation to clinic appointments.  Signed: Genelle Gather 05/11/2012, 9:19 PM

## 2012-05-12 ENCOUNTER — Inpatient Hospital Stay (HOSPITAL_COMMUNITY): Payer: Medicare PPO

## 2012-05-12 ENCOUNTER — Ambulatory Visit: Payer: Medicare PPO | Admitting: Internal Medicine

## 2012-05-12 DIAGNOSIS — J441 Chronic obstructive pulmonary disease with (acute) exacerbation: Secondary | ICD-10-CM | POA: Diagnosis present

## 2012-05-12 LAB — COMPREHENSIVE METABOLIC PANEL
Albumin: 3 g/dL — ABNORMAL LOW (ref 3.5–5.2)
Alkaline Phosphatase: 126 U/L — ABNORMAL HIGH (ref 39–117)
BUN: 26 mg/dL — ABNORMAL HIGH (ref 6–23)
CO2: 28 mEq/L (ref 19–32)
Chloride: 101 mEq/L (ref 96–112)
Creatinine, Ser: 0.85 mg/dL (ref 0.50–1.10)
GFR calc Af Amer: 74 mL/min — ABNORMAL LOW (ref >90)
GFR calc non Af Amer: 64 mL/min — ABNORMAL LOW (ref >90)
Glucose, Bld: 224 mg/dL — ABNORMAL HIGH (ref 70–99)
Potassium: 4.8 mEq/L (ref 3.5–5.1)
Total Bilirubin: 0.4 mg/dL (ref 0.3–1.2)

## 2012-05-12 LAB — CBC
MCV: 91.7 fL (ref 78.0–100.0)
Platelets: 283 10*3/uL (ref 150–400)
RDW: 13.5 % (ref 11.5–15.5)
WBC: 6.3 10*3/uL (ref 4.0–10.5)

## 2012-05-12 LAB — GLUCOSE, CAPILLARY
Glucose-Capillary: 198 mg/dL — ABNORMAL HIGH (ref 70–99)
Glucose-Capillary: 212 mg/dL — ABNORMAL HIGH (ref 70–99)
Glucose-Capillary: 234 mg/dL — ABNORMAL HIGH (ref 70–99)

## 2012-05-12 LAB — MRSA PCR SCREENING: MRSA by PCR: NEGATIVE

## 2012-05-12 MED ORDER — ALBUTEROL SULFATE (5 MG/ML) 0.5% IN NEBU
2.5000 mg | INHALATION_SOLUTION | Freq: Three times a day (TID) | RESPIRATORY_TRACT | Status: DC
  Administered 2012-05-12 – 2012-05-14 (×5): 2.5 mg via RESPIRATORY_TRACT
  Filled 2012-05-12 (×6): qty 0.5

## 2012-05-12 MED ORDER — DOXYCYCLINE HYCLATE 100 MG PO TABS
100.0000 mg | ORAL_TABLET | Freq: Two times a day (BID) | ORAL | Status: DC
  Administered 2012-05-12 – 2012-05-14 (×5): 100 mg via ORAL
  Filled 2012-05-12 (×6): qty 1

## 2012-05-12 MED ORDER — INSULIN GLARGINE 100 UNIT/ML ~~LOC~~ SOLN
5.0000 [IU] | Freq: Every day | SUBCUTANEOUS | Status: DC
  Administered 2012-05-12: 5 [IU] via SUBCUTANEOUS
  Filled 2012-05-12 (×2): qty 0.05

## 2012-05-12 MED ORDER — INSULIN GLARGINE 100 UNIT/ML ~~LOC~~ SOLN
12.0000 [IU] | Freq: Every day | SUBCUTANEOUS | Status: DC
  Filled 2012-05-12: qty 0.12

## 2012-05-12 MED ORDER — PREDNISONE 50 MG PO TABS
50.0000 mg | ORAL_TABLET | Freq: Every day | ORAL | Status: DC
  Administered 2012-05-13 – 2012-05-14 (×2): 50 mg via ORAL
  Filled 2012-05-12 (×3): qty 1

## 2012-05-12 NOTE — H&P (Signed)
Internal Medicine Attending Admission Note Date: 05/12/2012  Patient name: [name] Medical record number: [id number] Date of birth: 24-Sep-1932 Age: 79 y.o. Gender: female  I saw and evaluated the patient. I reviewed the resident's note and I agree with the resident's findings and plan as documented in the resident's note, with the following additional comments.  Chief Complaint(s): Shortness of breath, wheezing  History - key components related to admission: Patient is a 79 year old woman with history of COPD, coronary artery disease, diastolic heart failure, hyperlipidemia, gout, and other problems as outlined in the medical history admitted with complaint of wheezing and shortness of breath.  Patient reports that her breathing became worse yesterday at home while ambulating indoors, and she developed wheezing and worsened dyspnea.  Physical Exam - key components related to admission:  Filed Vitals:   05/11/12 2100 05/11/12 2252 05/12/12 0109 05/12/12 0601  BP: 154/48 169/43  148/62  Pulse: 54 64  55  Temp:  97.7 F (36.5 C)  97.9 F (36.6 C)  TempSrc:  Oral  Oral  Resp: 15 20  20   Weight:  278 lb (126.1 kg)  277 lb 12.5 oz (126 kg)  SpO2: 98% 96% 95% 96%    General: Alert; mildly tachypnea Lungs: Mild diffuse expiratory wheezing; few basilar crackles Heart: Regular; 3/6 systolic murmur; no extra sounds Abdomen: Bowel sounds present, soft, nontender; Extremities: No edema  Lab results:   Basic Metabolic Panel:  Recent Labs  40/98/11 1820 05/12/12 0600  NA 137 138  K 5.4* 4.8  CL 99 101  CO2 31 28  GLUCOSE 205* 224*  BUN 28* 26*  CREATININE 0.99 0.85  CALCIUM 10.0 9.6    Liver Function Tests:  Recent Labs  05/11/12 1820 05/12/12 0600  AST 66* 36  ALT 81* 64*  ALKPHOS 132* 126*  BILITOT 0.4 0.4  PROT 7.6 7.0  ALBUMIN 3.3* 3.0*    CBC:  Recent Labs  05/11/12 1820 05/12/12 0600  WBC 9.1 6.3  NEUTROABS 7.1  --   HGB 12.6 11.8*  HCT 38.4  36.5  MCV 93.9 91.7  PLT 320 283    Cardiac Enzymes:  Recent Labs  05/12/12 0600  TROPONINI <0.30    BNP (last 3 results)  Recent Labs  04/20/12 1730 05/11/12 1821  PROBNP 2371.0* 1440.0*    CBG:  Recent Labs  05/12/12 0004 05/12/12 0419 05/12/12 0742  GLUCAP 177* 220* 198*     Coagulation:  Recent Labs  05/11/12 1948 05/12/12 0600  INR 6.02* 4.57*    Imaging results:  Dg Chest 2 View  05/12/2012  *RADIOLOGY REPORT*  Clinical Data: Shortness of breath  CHEST - 2 VIEW  Comparison: May 11, 2012  Findings:   The degree of inspiration is somewhat shallow. There is a new small area of infiltrate in the left mid lung.  Elsewhere, the lungs are clear.  Heart is mildly enlarged.  Pulmonary vascularity is within normal limits.  There is stable elevation of the right hemidiaphragm.  No adenopathy.  No bone lesions.  IMPRESSION: Small of infiltrate left mid lung.  Stable elevation of the right hemidiaphragm.  Mild cardiac enlargement is stable.   Original Report Authenticated By: Bretta Bang, M.D.    Dg Chest 2 View  05/11/2012  *RADIOLOGY REPORT*  Clinical Data: 79 year old female shortness of breath atrial fibrillation.  CHEST - 2 VIEW  Comparison: 04/20/2012 and earlier.  Findings: AP and lateral views of the chest.  Stable low lung volumes. Stable cardiomegaly  and mediastinal contours.  No pneumothorax, pulmonary edema, pleural effusion or acute pulmonary opacity. Stable visualized osseous structures.  Numerous upper abdominal surgical clips.  IMPRESSION: No acute cardiopulmonary abnormality.   Original Report Authenticated By: Erskine Speed, M.D.     Other results: EKG: Atrial fibrillation; QS in 2, 3, aVF, and V1 through V4; no significant change   Assessment & Plan by Problem:  1.  COPD exacerbation.  Plan is IV steroids; inhaled bronchodilators; supplemental oxygen and follow saturations.  Patient reports that she was previously on Spiriva but that this had  been discontinued; she would likely benefit from resumption of Spiriva.  2.  Atrial fibrillation.  Rate is well controlled without medication.  Plan is to adjust warfarin and follow INR.  3.  Diabetes mellitus.  Controlled based upon recent hemoglobin A1c.  Plan is use sliding scale insulin in acute setting, follow blood glucose and adjust regimen as indicated.  4.  Other problems as per the resident physician's note.

## 2012-05-12 NOTE — Progress Notes (Signed)
Utilization review completed.  

## 2012-05-12 NOTE — Progress Notes (Signed)
Subjective: She reports some improvement in her shortness of breath. However, she feels that she's not quite back to her baseline. Objective: Vital signs in last 24 hours: Filed Vitals:   05/12/12 0109 05/12/12 0601 05/12/12 1129 05/12/12 1212  BP:  148/62    Pulse:  55    Temp:  97.9 F (36.6 C)    TempSrc:  Oral    Resp:  20    Height:    5' 1.81" (1.57 m)  Weight:  277 lb 12.5 oz (126 kg)    SpO2: 95% 96% 96%    Weight change:   Intake/Output Summary (Last 24 hours) at 05/12/12 1336 Last data filed at 05/12/12 1300  Gross per 24 hour  Intake    560 ml  Output      0 ml  Net    560 ml   Physical examination General appearance: alert, cooperative, mild distress, moderately obese and she is on 2 L by [place].  Throat: lips, mucosa, and tongue normal; teeth and gums normal Lungs: clear to auscultation bilaterally and with generalised wheezing. Heart: irregular rate and rhythm, S1, S2 normal, no murmur, click, rub or gallop Abdomen: soft, non-tender; bowel sounds normal; no masses,  no organomegaly and obese Extremities: extremities normal, atraumatic, no cyanosis or edema Pulses: 2+ and symmetric Skin: Skin color, texture, turgor normal. No rashes or lesions Neurologic: Alert and oriented X 3, normal strength and tone. Normal symmetric reflexes. Normal coordination and gait Lab Results: Basic Metabolic Panel:  Recent Labs Lab 05/11/12 1820 05/12/12 0600  NA 137 138  K 5.4* 4.8  CL 99 101  CO2 31 28  GLUCOSE 205* 224*  BUN 28* 26*  CREATININE 0.99 0.85  CALCIUM 10.0 9.6   Liver Function Tests:  Recent Labs Lab 05/11/12 1820 05/12/12 0600  AST 66* 36  ALT 81* 64*  ALKPHOS 132* 126*  BILITOT 0.4 0.4  PROT 7.6 7.0  ALBUMIN 3.3* 3.0*   CBC:  Recent Labs Lab 05/11/12 1820 05/12/12 0600  WBC 9.1 6.3  NEUTROABS 7.1  --   HGB 12.6 11.8*  HCT 38.4 36.5  MCV 93.9 91.7  PLT 320 283   Cardiac Enzymes:  Recent Labs Lab 05/12/12 0600  TROPONINI <0.30    BNP:  Recent Labs Lab 05/11/12 1821  PROBNP 1440.0*   CBG:  Recent Labs Lab 05/12/12 0004 05/12/12 0419 05/12/12 0742 05/12/12 1109  GLUCAP 177* 220* 198* 226*   Coagulation:  Recent Labs Lab 05/11/12 1948 05/12/12 0600  LABPROT 49.5* 40.4*  INR 6.02* 4.57*      Micro Results: Recent Results (from the past 240 hour(s))  MRSA PCR SCREENING     Status: None   Collection Time    05/12/12  4:19 AM      Result Value Range Status   MRSA by PCR NEGATIVE  NEGATIVE Final   Comment:            The GeneXpert MRSA Assay (FDA     approved for NASAL specimens     only), is one component of a     comprehensive MRSA colonization     surveillance program. It is not     intended to diagnose MRSA     infection nor to guide or     monitor treatment for     MRSA infections.   Studies/Results: Dg Chest 2 View  05/12/2012  *RADIOLOGY REPORT*  Clinical Data: Shortness of breath  CHEST - 2 VIEW  Comparison: May 11, 2012  Findings:   The degree of inspiration is somewhat shallow. There is a new small area of infiltrate in the left mid lung.  Elsewhere, the lungs are clear.  Heart is mildly enlarged.  Pulmonary vascularity is within normal limits.  There is stable elevation of the right hemidiaphragm.  No adenopathy.  No bone lesions.  IMPRESSION: Small of infiltrate left mid lung.  Stable elevation of the right hemidiaphragm.  Mild cardiac enlargement is stable.   Original Report Authenticated By: Bretta Bang, M.D.    Dg Chest 2 View  05/11/2012  *RADIOLOGY REPORT*  Clinical Data: 79 year old female shortness of breath atrial fibrillation.  CHEST - 2 VIEW  Comparison: 04/20/2012 and earlier.  Findings: AP and lateral views of the chest.  Stable low lung volumes. Stable cardiomegaly and mediastinal contours.  No pneumothorax, pulmonary edema, pleural effusion or acute pulmonary opacity. Stable visualized osseous structures.  Numerous upper abdominal surgical clips.  IMPRESSION: No  acute cardiopulmonary abnormality.   Original Report Authenticated By: Erskine Speed, M.D.    Medications: I have reviewed the patient's current medications. Scheduled Meds: . albuterol  2.5 mg Nebulization Q4H  . allopurinol  100 mg Oral BID  . amLODipine  5 mg Oral Daily  . aspirin EC  81 mg Oral Daily  . atorvastatin  40 mg Oral q1800  . doxycycline  100 mg Oral Q12H  . FLUoxetine  20 mg Oral BID  . furosemide  60 mg Oral BID  . gabapentin  200 mg Oral QHS  . heparin  5,000 Units Subcutaneous Q8H  . insulin aspart  0-9 Units Subcutaneous Q4H  . ipratropium  0.5 mg Nebulization Q4H  . levothyroxine  150 mcg Oral QAC breakfast  . methylPREDNISolone (SOLU-MEDROL) injection  60 mg Intravenous Q12H  . mometasone-formoterol  2 puff Inhalation BID  . montelukast  10 mg Oral QHS  . pantoprazole  40 mg Oral Daily  . tiotropium  18 mcg Inhalation Daily   Continuous Infusions:  PRN Meds:.acetaminophen, acetaminophen, albuterol, alum & mag hydroxide-simeth, morphine injection, ondansetron (ZOFRAN) IV, ondansetron Assessment/Plan: 79yo F with PMH HTN, HLD, DM II, CAD (MI '93), CVA '97, hyperkalemia, hypothyroidism, and CHF on 2L [place] at home presents to the ED per request of her Cardiologist's nurse due to wheezing heard over the phone.   Community acquired pneumonia: On her presentation she had increased wheezing and cough, associated with shortness of breath, which had worsened over the last few days. Differentials include COPD exacerbation, versus a pneumonia. Her oxygen remained stable. Her lung exam is revealing mild wheezing bilaterally. 2 view chest x-ray reveals a small area of infiltrate in the left mid lung. The patient had just recently completed a course of oral prednisone. However, she also reported that she was needing her usual inhalers more frequently than usual.  Plan. -Doxycycline 100 mg twice a day for 7 days. -She will require a followup chest x-ray after 6 weeks. -Will  continue to monitor with supplemental oxygen to keep oxygen saturations between 89-93%.  COPD exacerbation: This is likely to contribute to her presentation with wheezing and shortness of breath. Chest exam is also revealing some mild wheezing bilaterally. However, as noted above. Her chest x-ray reveals some left mid lung infiltrate with a possible pneumonia. Regardless will treat her as COPD exacerbation, as well.  Plan -Switched to Prednisone po 50 mg daily for course of 5 days. -Oxygen supplementation as needed to keep O2 sat 89-93%.  -At home she  uses oxygen daily. - Duonebs q4h  - Continuing Singulair  - Continuing Spiriva    CHF: An ECHO was performed during her last admission 03/2012, which showed EF 55%, normal diastolic function, focal basal hypertrophy of septum, unable to assess LV wall motion. Her proBNP is almost half of what it was at her last admission, so this is likely not a CHF exacerbation. Her physical examination does not reveal fluid overload.   Plan - Holding Losartan  - Increasing Lasix to 60 po BID   Transaminitis: AST and ALT both elevated. Previously normal in February. She just completed a course of Prednisone, which can cause elevated AST, ALT, and alkaline phosphatase. For now, we will assume that the prednisone is the culpret. Repeat liver function tests show some improvement in the level of AST is from 66 to 36, ALT from 81 to 64, and alkaline phosphatase from 132 to 126. No need to monitor.  Atrial fibrillation: Patient is on warfarin for atrial fibrillation, 5mg  po daily. She had previously taken diltiazem for rate control, now not on any rate-controlling medications in setting of bradycardia (HR 50s). Bradycardia in absence of nodal blocking agent suggests poorer compensation for atrial fibrillation. On admission, she appears to be in sinus rhythm during examination. Unfortunately her INR is 6. Follows w Dr. Alexandria Lodge as outpatient. Her INR has improved from 6.0 to  2.57. -Continue holding Coumadin  -Will need to initiate an appropriate dose of discharge. -Pharmacy will be consulted  CAD, nonobstructive: Patient has history of prior MI. In 2006, cath was performed which showed 30-40% stenosis of circumflex, no interventions. Not on a beta blocker due to bradycardia.  - Continuing statin  - Continuing ASA 81mg    Chronic lung disease, obstructive/restrictive pattern: Patient has obstructive and restrictive pattern of lung disease. Currently on albulterol inhaler, advair, and singulair at home. She states that she is no longer using the Spiriva.   Hyperkalemia:  Potassium 5.4 in the ED, no EKG changes. No longer on spironolactone. Possible causes is losartan. Should decrease with diuresis. Repeat potassium level is better at 4.8.   HTN: Patient's BP 154/48 in ED. On Lasix, Norvasc, and Losartan at home. Spironolactone and her beta blocker were held after last admission 03/2012.  - Increasing lasix, as noted above in #1  - Continuing amlodipine  - Holding ARB though this can be restarted later if needed and hyperkalemia better.   Diabetes: Last HbA1c 7.0 early last month. On glipizide at home. CBGs running high in 200s - Holding glipizide  - SSI while inpatient  - added some Lantus 12 units QHS    Gout: Recently treated for gout flare with prednisone taper. Still c/o pain. Uric acid not at goal (9.8 as of 03/31/12). Per notes from last visit, plan was to start colchicine and allopurinol after finishing the prednisone taper. She was to be seen in clinic on 3/18 but rescheduled her appointment.  - Rechecking serum uric acid levels  - Allopurinol 100mg  po BID  Depression: Stable.  - Continue fluoxetine  GERD: Stable. On omeprazole at home.  - Protonix 40mg  po daily  Hypothyrodism: Synthroid dose increased on 03/31/12 from 125 to 150 mcg daily. Last TSH 1.277 on 2/24.  - Synthroid po daily  HLD: Last lipid panel with elevated triglycerides to 192 and  low HDL to 36, otherwise normal. She is on Lipitor at home, which was continued on admission.  - Lipitor 40mg  po daily   DVT PPx: No Heparin since  she is super-therapeutic on coumadin.   Dispo: Disposition is deferred at this time, awaiting improvement of current medical problems.  Anticipated discharge in approximately 1-2 day(s).   The patient does have a current PCP (BHARDWAJ, SHILPA, MD), therefore will be requiring [facility]PC follow-up after discharge.   The patient does not have transportation limitations that hinder transportation to clinic appointments.  .Services Needed at time of discharge: Y = Yes, Blank = No PT:   OT:   RN:   Equipment:   Other:     LOS: 1 day   Dow Adolph PGY1 - Internal Medicine Teaching Service Pager: [phone] 05/12/2012, 1:36 PM

## 2012-05-12 NOTE — Plan of Care (Signed)
Problem: Phase I Progression Outcomes Goal: Up in chair, BRP Outcome: Completed/Met Date Met:  05/12/12 Pt oob to chair with assist x 1 and walker, pt tolerates well Goal: Voiding-avoid urinary catheter unless indicated Outcome: Completed/Met Date Met:  05/12/12 Pt voiding adequate amts of urine

## 2012-05-12 NOTE — Progress Notes (Signed)
Inpatient Diabetes Program Recommendations  AACE/ADA: New Consensus Statement on Inpatient Glycemic Control (2013)  Target Ranges:  Prepandial:   less than 140 mg/dL      Peak postprandial:   less than 180 mg/dL (1-2 hours)      Critically ill patients:  140 - 180 mg/dL   Reason for Visit: Results for BARBARAANN, AVANS (MRN [id number]) as of 05/12/2012 16:27  Ref. Range 05/12/2012 00:04 05/12/2012 04:19 05/12/2012 07:42 05/12/2012 11:09  Glucose-Capillary Latest Range: 70-99 mg/dL 244 (H) 010 (H) 272 (H) 226 (H)   Please consider adding Novolog meal coverage 4 units tid with meals while on PO Prednisone.  Note A1C in February was 7.0%.   Will follow.

## 2012-05-12 NOTE — ED Provider Notes (Signed)
Medical screening examination/treatment/procedure(s) were performed by non-physician practitioner and as supervising physician I was immediately available for consultation/collaboration.   Loren Racer, MD 05/12/12 [phone]

## 2012-05-13 LAB — GLUCOSE, CAPILLARY
Glucose-Capillary: 247 mg/dL — ABNORMAL HIGH (ref 70–99)
Glucose-Capillary: 253 mg/dL — ABNORMAL HIGH (ref 70–99)

## 2012-05-13 MED ORDER — DIPHENHYDRAMINE HCL 25 MG PO CAPS
25.0000 mg | ORAL_CAPSULE | Freq: Once | ORAL | Status: AC
  Administered 2012-05-13: 25 mg via ORAL
  Filled 2012-05-13: qty 1

## 2012-05-13 MED ORDER — INSULIN ASPART 100 UNIT/ML ~~LOC~~ SOLN
0.0000 [IU] | Freq: Three times a day (TID) | SUBCUTANEOUS | Status: DC
  Administered 2012-05-13: 5 [IU] via SUBCUTANEOUS
  Administered 2012-05-13 – 2012-05-14 (×2): 3 [IU] via SUBCUTANEOUS

## 2012-05-13 MED ORDER — INSULIN ASPART 100 UNIT/ML ~~LOC~~ SOLN
3.0000 [IU] | Freq: Three times a day (TID) | SUBCUTANEOUS | Status: DC
  Administered 2012-05-13 – 2012-05-14 (×4): 3 [IU] via SUBCUTANEOUS

## 2012-05-13 MED ORDER — INSULIN ASPART 100 UNIT/ML ~~LOC~~ SOLN: 4.0000 [IU] | Freq: Three times a day (TID) | SUBCUTANEOUS | Status: DC

## 2012-05-13 MED ORDER — GLIPIZIDE 10 MG PO TABS
10.0000 mg | ORAL_TABLET | Freq: Two times a day (BID) | ORAL | Status: DC
  Administered 2012-05-13 – 2012-05-14 (×2): 10 mg via ORAL
  Filled 2012-05-13 (×4): qty 1

## 2012-05-13 NOTE — Progress Notes (Signed)
Internal Medicine Attending  Date: 05/13/2012  Patient name: [name] Medical record number: [id number] Date of birth: 1932-06-23 Age: 79 y.o. Gender: female  I saw and evaluated the patient on AM rounds with housestaff.  I reviewed the resident's note by Dr. Zada Girt and I agree with the resident's findings and plans as documented in his note.

## 2012-05-13 NOTE — Progress Notes (Signed)
Subjective: She reports that she had a lot of itching after receiving lantus last night. She did not sleep well. She is otherwise feel better today.   Objective: Vital signs in last 24 hours: Filed Vitals:   05/12/12 2049 05/13/12 0424 05/13/12 1050 05/13/12 1437  BP:  152/66  132/97  Pulse:  63  64  Temp:  98 F (36.7 C)  98 F (36.7 C)  TempSrc:  Oral  Oral  Resp:  18  18  Height:      Weight:  275 lb 12.8 oz (125.102 kg)    SpO2: 95% 93% 92% 93%   Weight change: -3.5 oz (-0.099 kg)  Intake/Output Summary (Last 24 hours) at 05/13/12 1643 Last data filed at 05/13/12 1230  Gross per 24 hour  Intake    720 ml  Output      0 ml  Net    720 ml   Physical examination General appearance: alert, cooperative, mild distress, moderately obese and she is on 2 L by [place].  Throat: lips, mucosa, and tongue normal; teeth and gums normal Lungs: clear to auscultation bilaterally and with generalised wheezing. Heart: irregular rate and rhythm, S1, S2 normal, no murmur, click, rub or gallop Abdomen: soft, non-tender; bowel sounds normal; no masses,  no organomegaly and obese. She has scratch marks on her abdomen with one of them bleeding from the scratching. Extremities: extremities normal, atraumatic, no cyanosis or edema Pulses: 2+ and symmetric Skin: Skin color, texture, turgor normal. No rashes or lesions Neurologic: Alert and oriented X 3, normal strength and tone. Normal symmetric reflexes. Normal coordination and gait Lab Results: Basic Metabolic Panel:  Recent Labs Lab 05/11/12 1820 05/12/12 0600  NA 137 138  K 5.4* 4.8  CL 99 101  CO2 31 28  GLUCOSE 205* 224*  BUN 28* 26*  CREATININE 0.99 0.85  CALCIUM 10.0 9.6   Liver Function Tests:  Recent Labs Lab 05/11/12 1820 05/12/12 0600  AST 66* 36  ALT 81* 64*  ALKPHOS 132* 126*  BILITOT 0.4 0.4  PROT 7.6 7.0  ALBUMIN 3.3* 3.0*   CBC:  Recent Labs Lab 05/11/12 1820 05/12/12 0600  WBC 9.1 6.3  NEUTROABS 7.1  --    HGB 12.6 11.8*  HCT 38.4 36.5  MCV 93.9 91.7  PLT 320 283   Cardiac Enzymes:  Recent Labs Lab 05/12/12 0600  TROPONINI <0.30   BNP:  Recent Labs Lab 05/11/12 1821  PROBNP 1440.0*   CBG:  Recent Labs Lab 05/12/12 1627 05/12/12 2000 05/12/12 2351 05/13/12 0403 05/13/12 0801 05/13/12 1214  GLUCAP 282* 234* 212* 168* 125* 247*   Coagulation:  Recent Labs Lab 05/11/12 1948 05/12/12 0600 05/13/12 0800  LABPROT 49.5* 40.4* 41.8*  INR 6.02* 4.57* 4.79*      Micro Results: Recent Results (from the past 240 hour(s))  MRSA PCR SCREENING     Status: None   Collection Time    05/12/12  4:19 AM      Result Value Range Status   MRSA by PCR NEGATIVE  NEGATIVE Final   Comment:            The GeneXpert MRSA Assay (FDA     approved for NASAL specimens     only), is one component of a     comprehensive MRSA colonization     surveillance program. It is not     intended to diagnose MRSA     infection nor to guide or  monitor treatment for     MRSA infections.   Studies/Results: Dg Chest 2 View  05/12/2012  *RADIOLOGY REPORT*  Clinical Data: Shortness of breath  CHEST - 2 VIEW  Comparison: May 11, 2012  Findings:   The degree of inspiration is somewhat shallow. There is a new small area of infiltrate in the left mid lung.  Elsewhere, the lungs are clear.  Heart is mildly enlarged.  Pulmonary vascularity is within normal limits.  There is stable elevation of the right hemidiaphragm.  No adenopathy.  No bone lesions.  IMPRESSION: Small of infiltrate left mid lung.  Stable elevation of the right hemidiaphragm.  Mild cardiac enlargement is stable.   Original Report Authenticated By: Bretta Bang, M.D.    Dg Chest 2 View  05/11/2012  *RADIOLOGY REPORT*  Clinical Data: 79 year old female shortness of breath atrial fibrillation.  CHEST - 2 VIEW  Comparison: 04/20/2012 and earlier.  Findings: AP and lateral views of the chest.  Stable low lung volumes. Stable  cardiomegaly and mediastinal contours.  No pneumothorax, pulmonary edema, pleural effusion or acute pulmonary opacity. Stable visualized osseous structures.  Numerous upper abdominal surgical clips.  IMPRESSION: No acute cardiopulmonary abnormality.   Original Report Authenticated By: Erskine Speed, M.D.    Medications: I have reviewed the patient's current medications. Scheduled Meds: . albuterol  2.5 mg Nebulization TID  . allopurinol  100 mg Oral BID  . amLODipine  5 mg Oral Daily  . aspirin EC  81 mg Oral Daily  . atorvastatin  40 mg Oral q1800  . doxycycline  100 mg Oral Q12H  . FLUoxetine  20 mg Oral BID  . furosemide  60 mg Oral BID  . gabapentin  200 mg Oral QHS  . glipiZIDE  10 mg Oral BID AC  . insulin aspart  0-9 Units Subcutaneous TID WC  . insulin aspart  3 Units Subcutaneous TID WC  . levothyroxine  150 mcg Oral QAC breakfast  . mometasone-formoterol  2 puff Inhalation BID  . montelukast  10 mg Oral QHS  . pantoprazole  40 mg Oral Daily  . predniSONE  50 mg Oral Q breakfast  . tiotropium  18 mcg Inhalation Daily   Continuous Infusions:  PRN Meds:.acetaminophen, acetaminophen, albuterol, alum & mag hydroxide-simeth, morphine injection, ondansetron (ZOFRAN) IV, ondansetron Assessment/Plan: 79yo F with PMH HTN, HLD, DM II, CAD (MI '93), CVA '97, hyperkalemia, hypothyroidism, and CHF on 2L [place] at home presents to the ED per request of her Cardiologist's nurse due to wheezing heard over the phone.   Community acquired pneumonia: On her presentation she had increased wheezing and cough, associated with shortness of breath, which had worsened over a few days. Differentials include COPD exacerbation, versus a pneumonia. Her oxygen remained stable. Her lung exam was marked for mild wheezing bilaterally. 2 view chest x-ray revealed a small area of infiltrate in the left mid lung.  Plan. -continue with Doxycycline 100 mg twice a day for 7 days. This is day three of treatment. -She will  require a followup chest x-ray after 6 weeks. -Will continue to monitor with supplemental oxygen to keep oxygen saturations between 89-93%.  COPD exacerbation: This is likely to contribute to her presentation with wheezing and shortness of breath. Chest exam revealed some mild wheezing bilaterally. However, as noted above, her chest x-ray reveals some left mid lung infiltrate with a possible pneumonia. Regardless we treated her as COPD exacerbation, as well.  Plan -Switched to prednisone po 50 mg daily  for course of 5 days. -Oxygen supplementation as needed to keep O2 sat 89-93%.  -At home she uses oxygen daily. - Continuing Singulair  - Continuing Spiriva  -Albuterol    CHF: An ECHO was performed during her last admission 03/2012, which showed EF 55%, normal diastolic function, focal basal hypertrophy of septum, unable to assess LV wall motion. Her proBNP is almost half of what it was at her last admission, so this is likely not a CHF exacerbation. Her physical examination does not reveal fluid overload.   Plan - Holding Losartan  -continue with Lasix to 60 po BID   Transaminitis: AST and ALT both elevated. Previously normal in February. She just completed a course of Prednisone, which can cause elevated AST, ALT, and alkaline phosphatase. For now, we will assume that the prednisone is the culpret. Repeat liver function tests show some improvement in the level of AST is from 66 to 36, ALT from 81 to 64, and alkaline phosphatase from 132 to 126. No need to monitor.  Atrial fibrillation: Patient is on warfarin for atrial fibrillation, 5mg  po daily. She had previously taken diltiazem for rate control, now not on any rate-controlling medications in setting of bradycardia (HR 50s). Bradycardia in absence of nodal blocking agent suggests poorer compensation for atrial fibrillation. On admission, she appears to be in sinus rhythm during examination. Unfortunately her INR is 6. Follows w Dr. Alexandria Lodge as  outpatient.   Plan  -Continue holding Coumadin - INR 4.7  -Will need to initiate an appropriate dose of discharge if needed.    CAD, nonobstructive: Patient has history of prior MI. In 2006, cath was performed which showed 30-40% stenosis of circumflex, no interventions. Not on a beta blocker due to bradycardia.  - Continuing statin  - Continuing ASA 81mg    Chronic lung disease, obstructive/restrictive pattern: Patient has obstructive and restrictive pattern of lung disease. Currently on albulterol inhaler, advair, and singulair at home. She states that she is no longer using the Spiriva.   Hyperkalemia: Resolved Potassium 5.4 in the ED, no EKG changes. No longer on spironolactone. Possible cause is losartan. Should decrease with diuresis. Potassium level has remained stable. Will continue to monitor.   HTN: Patient's BP 154/48 in ED. On Lasix, Norvasc, and Losartan at home. Spironolactone and her beta blocker were held after last admission 03/2012.  - Increasing lasix, as noted above - Continuing amlodipine  -   Diabetes: Last HbA1c 7.0 early last month. On glipizide at home. CBGs running high in 200s. Patient has itching related to the Lantus. Plan -Restart home glipizide  -SSI while inpatient     Gout: Recently treated for gout flare with prednisone taper. Still c/o pain. Uric acid not at goal (9.8 as of 03/31/12). Per notes from last visit, plan was to start colchicine and allopurinol after finishing the prednisone taper. She was to be seen in clinic on 3/18 but rescheduled her appointment.  - Allopurinol 100mg  po BID  Depression: Stable.  - Continue fluoxetine  GERD: Stable. On omeprazole at home.  - Protonix 40mg  po daily  Hypothyrodism: Synthroid dose increased on 03/31/12 from 125 to 150 mcg daily. Last TSH 1.277 on 2/24.  - Synthroid po daily  HLD: Last lipid panel with elevated triglycerides to 192 and low HDL to 36, otherwise normal. She is on Lipitor at home, which was  continued on admission.  - Lipitor 40mg  po daily   DVT PPx: No Heparin since she is super-therapeutic on coumadin.  Dispo: Disposition is deferred at this time, awaiting improvement of current medical problems.  Anticipated discharge in approximately 1-2 day(s).   The patient does have a current PCP (BHARDWAJ, SHILPA, MD), therefore will be requiring OPC follow-up after discharge.   The patient does not have transportation limitations that hinder transportation to clinic appointments.  .Services Needed at time of discharge: Y = Yes, Blank = No PT:   OT:   RN:   Equipment:   Other:     LOS: 2 days   Dow Adolph PGY1 - Internal Medicine Teaching Service Pager: [phone] 05/13/2012, 4:43 PM

## 2012-05-13 NOTE — Plan of Care (Signed)
Problem: Phase I Progression Outcomes Goal: EF % per last Echo/documented,Core Reminder form on chart Outcome: Completed/Met Date Met:  05/13/12 EF 55% per ECHO performed on 04/21/12.

## 2012-05-13 NOTE — Evaluation (Addendum)
Physical Therapy Evaluation Patient Details Name: [name] MRN: [id number] DOB: Sep 08, 1932 Today's Date: 05/13/2012 Time: 4098-1191 PT Time Calculation (min): 28 min  PT Assessment / Plan / Recommendation Clinical Impression  79 y/o female adm. for wheezing in the setting of chf, pna and copd. Presents to PT with generalized weakness, impaired balance and decreased safety awareness affecting safety with mobility. Will benefit physical therapy in the acute setting to maximize functional independence for safe d/c home. It was recommended last admission she go to [facility] prior to home and patient refused. Spoke to her again about this and she continues to refuse therefore will need HHPT for f/u at home on d/c in addition to as much assist as she can get from her son and aide.     PT Assessment  Patient needs continued PT services    Follow Up Recommendations  Home health PT;Supervision/Assistance - 24 hour    Does the patient have the potential to tolerate intense rehabilitation      Barriers to Discharge Decreased caregiver support      Equipment Recommendations  None recommended by PT    Recommendations for Other Services     Frequency Min 3X/week    Precautions / Restrictions Precautions Precautions: Fall Restrictions Weight Bearing Restrictions: No   Pertinent Vitals/Pain Complaining of 5/10 gout pain in her feet      Mobility  Bed Mobility Bed Mobility: Supine to Sit Supine to Sit: 3: Mod assist Details for Bed Mobility Assistance: pt initiating movement but reaching out for therapists hand pulls with great force to bring her trunk upright from the bed Transfers Sit to Stand: 4: Min assist;With upper extremity assist;From bed;From chair/3-in-1 (sit<>stand x2) Stand to Sit: 4: Min assist;With upper extremity assist;To chair/3-in-1 Details for Transfer Assistance: min tactile assist for safety and stability Ambulation/Gait Ambulation/Gait Assistance: 4: Min  assist Ambulation Distance (Feet): 15 Feet Assistive device: Rolling walker Ambulation/Gait Assistance Details: cues for safety with RW, pt tends to run her RW into objects to move then; impulsive with turns, unsafely picking up RW and twirling around Gait Pattern: Shuffle;Step-through pattern;Trunk flexed;Decreased stride length Gait velocity: decreased         PT Diagnosis: Difficulty walking;Abnormality of gait;Generalized weakness  PT Problem List: Decreased strength;Decreased activity tolerance;Decreased balance;Decreased mobility;Decreased knowledge of use of DME;Cardiopulmonary status limiting activity;Decreased safety awareness;Obesity PT Treatment Interventions: DME instruction;Gait training;Functional mobility training;Therapeutic activities;Therapeutic exercise;Balance training;Stair training;Neuromuscular re-education;Cognitive remediation;Patient/family education   PT Goals Acute Rehab PT Goals PT Goal Formulation: With patient Time For Goal Achievement: 05/20/12 Pt will go Supine/Side to Sit: with modified independence PT Goal: Supine/Side to Sit - Progress: Goal set today Pt will go Sit to Supine/Side: with modified independence PT Goal: Sit to Supine/Side - Progress: Goal set today Pt will go Sit to Stand: with modified independence PT Goal: Sit to Stand - Progress: Goal set today Pt will go Stand to Sit: with modified independence PT Goal: Stand to Sit - Progress: Goal set today Pt will Stand: with modified independence;3 - 5 min PT Goal: Stand - Progress: Goal set today Pt will Ambulate: 16 - 50 feet;with modified independence;with least restrictive assistive device PT Goal: Ambulate - Progress: Goal set today Pt will Go Up / Down Stairs: 1-2 stairs;with modified independence PT Goal: Up/Down Stairs - Progress: Goal set today Pt will Perform Home Exercise Program: Independently PT Goal: Perform Home Exercise Program - Progress: Goal set today  Visit Information   Last PT Received On: 05/13/12 Assistance Needed: +  2 (for walking, +1 for transfers)    Subjective Data  Subjective: I am doing fine at home!  Patient Stated Goal: home   Prior Functioning  Home Living Lives With: Son Available Help at Discharge: Family;Available PRN/intermittently;Personal care attendant Type of Home: House Home Access: Stairs to enter [employer] of Steps: 2 Home Layout: One level Bathroom Shower/Tub: Engineer, manufacturing systems: Standard Home Adaptive Equipment: Walker - rolling;Shower chair with back Prior Function Level of Independence: Needs assistance Needs Assistance: Bathing;Light Housekeeping;Meal Prep Bath: Minimal Meal Prep: Moderate Light Housekeeping: Moderate Able to Take Stairs?: Yes Driving: No Vocation: On disability Comments: walks by her self with walker, aide is there 6 days/wk 9-1 pm Communication Communication: No difficulties    Cognition  Cognition Overall Cognitive Status: Impaired Area of Impairment: Safety/judgement Arousal/Alertness: Awake/alert Orientation Level: Appears intact for tasks assessed Behavior During Session: Flat affect Safety/Judgement: Decreased awareness of need for assistance Cognition - Other Comments: defensive    Extremity/Trunk Assessment Right Lower Extremity Assessment RLE ROM/Strength/Tone: Deficits RLE ROM/Strength/Tone Deficits: generally weak, grossly 4/5 RLE Sensation: History of peripheral neuropathy Left Lower Extremity Assessment LLE ROM/Strength/Tone Deficits: generally weak, grossly 4/5 LLE Sensation: History of peripheral neuropathy   Balance Static Standing Balance Static Standing - Balance Support: Bilateral upper extremity supported Static Standing - Level of Assistance: 5: Stand by assistance  End of Session PT - End of Session Equipment Utilized During Treatment: Gait belt;Oxygen Activity Tolerance: Patient limited by fatigue Patient left: in chair;with call  bell/phone within reach Nurse Communication: Mobility status  GP     [facility] HELEN 05/13/2012, 11:18 AM

## 2012-05-13 NOTE — Plan of Care (Signed)
Problem: Phase I Progression Outcomes Goal: Dyspnea controlled at rest (HF) Outcome: Completed/Met Date Met:  05/13/12 Pt has not had any c/o SOB while at rest, pt on 2L at home, sats WNL

## 2012-05-13 NOTE — Evaluation (Signed)
Occupational Therapy Evaluation Patient Details Name: [name] MRN: [id number] DOB: Apr 16, 1932 Today's Date: 05/13/2012 Time: 9562-1308 OT Time Calculation (min): 21 min  OT Assessment / Plan / Recommendation Clinical Impression  Pt admitted with wheezing and mild dyspnea.  Will benefit from continued OT services to address below problem list.  Feel that pt would benefit from ST SNF but pt refusing and reports she wants to go home. Will recommend HHOT.      OT Assessment  Patient needs continued OT Services    Follow Up Recommendations  Home health OT    Barriers to Discharge      Equipment Recommendations  None recommended by OT    Recommendations for Other Services    Frequency  Min 2X/week    Precautions / Restrictions Precautions Precautions: Fall Restrictions Weight Bearing Restrictions: No   Pertinent Vitals/Pain See vitals    ADL  Grooming: Performed;Wash/dry hands;Min guard Where Assessed - Grooming: Unsupported standing Toilet Transfer: Performed;Minimal Dentist Method: Sit to Barista: Bedside commode Toileting - Clothing Manipulation and Hygiene: Performed;Min guard Where Assessed - Engineer, mining and Hygiene: Standing Equipment Used: Gait belt;Rolling walker Transfers/Ambulation Related to ADLs: Min assist with RW ambulating in room. Pt has tendency to run into objects in room and pick up RW during turns.  ADL Comments: Pt generally weak and requiring constant cueing for safe use of RW.  Discussed with pt option of ST SNF for further rehab before returning home, but pt refusing to consider SNF.    OT Diagnosis: Generalized weakness;Cognitive deficits;Acute pain  OT Problem List: Decreased strength;Decreased activity tolerance;Decreased safety awareness;Decreased knowledge of use of DME or AE;Obesity;Pain OT Treatment Interventions: Self-care/ADL training;DME and/or AE instruction;Therapeutic  activities;Cognitive remediation/compensation;Patient/family education   OT Goals Acute Rehab OT Goals OT Goal Formulation: With patient Time For Goal Achievement: 05/27/12 Potential to Achieve Goals: Good ADL Goals Pt Will Transfer to Toilet: with modified independence;Ambulation;with DME;Regular height toilet ADL Goal: Toilet Transfer - Progress: Goal set today Pt Will Perform Toileting - Clothing Manipulation: with modified independence;Sitting on 3-in-1 or toilet;Standing ADL Goal: Toileting - Clothing Manipulation - Progress: Goal set today Pt Will Perform Toileting - Hygiene: with modified independence;Sit to stand from 3-in-1/toilet ADL Goal: Toileting - Hygiene - Progress: Goal set today Miscellaneous OT Goals Miscellaneous OT Goal #1: Pt will independently demonstrate safe use of RW during all functional mobility. OT Goal: Miscellaneous Goal #1 - Progress: Goal set today  Visit Information  Last OT Received On: 05/13/12 Assistance Needed: +2 (for walking, +1 for transfers) PT/OT Co-Evaluation/Treatment: Yes    Subjective Data      Prior Functioning     Home Living Lives With: Son Available Help at Discharge: Family;Available PRN/intermittently;Personal care attendant Type of Home: House Home Access: Stairs to enter [employer] of Steps: 2 Home Layout: One level Bathroom Shower/Tub: Engineer, manufacturing systems: Standard Home Adaptive Equipment: Walker - rolling;Shower chair with back (2L O2 nasal cannula) Prior Function Level of Independence: Needs assistance Needs Assistance: Bathing;Light Housekeeping;Meal Prep Bath: Minimal Meal Prep: Moderate Light Housekeeping: Moderate Able to Take Stairs?: Yes Driving: No Vocation: On disability Comments: walks by her self with walker, aide is there 6 days/wk 9-1 pm Communication Communication: No difficulties         Vision/Perception Vision - History Baseline Vision: Wears glasses all the  time   Cognition  Cognition Overall Cognitive Status: Impaired Area of Impairment: Safety/judgement Arousal/Alertness: Awake/alert Orientation Level: Appears intact for tasks assessed  Behavior During Session: Flat affect Safety/Judgement: Decreased awareness of need for assistance Cognition - Other Comments: defensive    Extremity/Trunk Assessment Right Upper Extremity Assessment RUE ROM/Strength/Tone: WFL for tasks assessed Left Upper Extremity Assessment LUE ROM/Strength/Tone: WFL for tasks assessed Right Lower Extremity Assessment RLE ROM/Strength/Tone: Deficits RLE ROM/Strength/Tone Deficits: generally weak, grossly 4/5 RLE Sensation: History of peripheral neuropathy Left Lower Extremity Assessment LLE ROM/Strength/Tone Deficits: generally weak, grossly 4/5 LLE Sensation: History of peripheral neuropathy     Mobility Bed Mobility Bed Mobility: Supine to Sit Supine to Sit: 3: Mod assist Details for Bed Mobility Assistance: pt initiating movement but reaching out for therapists hand pulls with great force to bring her trunk upright from the bed Transfers Transfers: Sit to Stand;Stand to Sit Sit to Stand: 4: Min assist;With upper extremity assist;From bed;From chair/3-in-1 Stand to Sit: 4: Min assist;With upper extremity assist;To chair/3-in-1 Details for Transfer Assistance: min tactile assist for safety and stability     Exercise     Balance Static Standing Balance Static Standing - Balance Support: Bilateral upper extremity supported Static Standing - Level of Assistance: 5: Stand by assistance   End of Session OT - End of Session Equipment Utilized During Treatment: Gait belt Activity Tolerance: Patient tolerated treatment well Patient left: in chair;with call bell/phone within reach Nurse Communication: Mobility status  GO   05/13/2012 Cipriano Mile OTR/L Pager [phone] Office [phone]   Cipriano Mile 05/13/2012, 2:46 PM

## 2012-05-14 LAB — GLUCOSE, CAPILLARY
Glucose-Capillary: 86 mg/dL (ref 70–99)
Glucose-Capillary: 208 mg/dL — ABNORMAL HIGH (ref 70–99)

## 2012-05-14 LAB — PROTIME-INR: Prothrombin Time: 29.8 seconds — ABNORMAL HIGH (ref 11.6–15.2)

## 2012-05-14 MED ORDER — DOXYCYCLINE HYCLATE 100 MG PO TABS: 100.0000 mg | ORAL_TABLET | Freq: Two times a day (BID) | ORAL | Status: DC

## 2012-05-14 MED ORDER — WARFARIN SODIUM 5 MG PO TABS: 5.0000 mg | ORAL_TABLET | ORAL | Status: DC

## 2012-05-14 NOTE — Progress Notes (Signed)
Spoke to physician regarding patients HR sustaining in the upper 30s to low 40s. Orders to change the patients low limit on the monitor to 35. Duey Liller, Melida Quitter, RN

## 2012-05-14 NOTE — Discharge Summary (Signed)
Internal Medicine Teaching [facility] Discharge Note  Name: [name] MRN: [id number] DOB: December 12, 1932 79 y.o.  Date of Admission: 05/11/2012  6:01 PM Date of Discharge: 05/14/2012 Attending Physician: Farley Ly, MD  Discharge Diagnosis: Principal Problem:   COPD Active Problems:   DIABETES MELLITUS   HYPERTENSION   Atrial fibrillation   Long-term (current) use of anticoagulants   Acute on chronic diastolic CHF (congestive heart failure)   COPD exacerbation   Elevated liver enzymes   Bradycardia   Subtherapeutic international normalized ratio (INR)  a  Discharge Medications:   Medication List    STOP taking these medications       losartan 50 MG tablet  Commonly known as:  COZAAR     predniSONE 5 MG tablet  Commonly known as:  DELTASONE      TAKE these medications       acetaminophen 500 MG tablet  Commonly known as:  TYLENOL  Take 1,000 mg by mouth 2 (two) times daily as needed. For pain.     albuterol 108 (90 BASE) MCG/ACT inhaler  Commonly known as:  PROVENTIL HFA  Inhale 2 puffs into the lungs every 6 (six) hours as needed. For wheezing.     allopurinol 100 MG tablet  Commonly known as:  ZYLOPRIM  Take 100 mg by mouth 2 (two) times daily.     amLODipine 5 MG tablet  Commonly known as:  NORVASC  Take 5 mg by mouth daily.     aspirin 81 MG EC tablet  Take 1 tablet (81 mg total) by mouth daily.     atorvastatin 40 MG tablet  Commonly known as:  LIPITOR  TAKE ONE TABLET BY MOUTH EVERY DAY     doxycycline 100 MG tablet  Commonly known as:  VIBRA-TABS  Take 1 tablet (100 mg total) by mouth 2 (two) times daily.     FLUoxetine 20 MG capsule  Commonly known as:  PROZAC  Take 20 mg by mouth 2 (two) times daily.     Fluticasone-Salmeterol 250-50 MCG/DOSE Aepb  Commonly known as:  ADVAIR  Inhale 1 puff into the lungs every 12 (twelve) hours.     furosemide 20 MG tablet  Commonly known as:  LASIX  Take 2 tablets (40 mg total) by  mouth 2 (two) times daily.     gabapentin 100 MG capsule  Commonly known as:  NEURONTIN  TAKE TWO CAPSULES BY MOUTH TWICE DAILY     glipiZIDE 10 MG tablet  Commonly known as:  GLUCOTROL  Take 10 mg by mouth 2 (two) times daily before a meal.     levothyroxine 150 MCG tablet  Commonly known as:  SYNTHROID  Take 1 tablet (150 mcg total) by mouth daily.     loperamide 2 MG capsule  Commonly known as:  IMODIUM  Take 1 capsule (2 mg total) by mouth as needed for diarrhea or loose stools (up to total 16mg  daily).     montelukast 10 MG tablet  Commonly known as:  SINGULAIR  TAKE ONE TABLET BY MOUTH EVERY DAY     omeprazole 20 MG capsule  Commonly known as:  PRILOSEC  Take 40 mg by mouth 2 (two) times daily.     tiotropium 18 MCG inhalation capsule  Commonly known as:  SPIRIVA HANDIHALER  Place 1 capsule (18 mcg total) into inhaler and inhale daily.     warfarin 5 MG tablet  Commonly known as:  COUMADIN  Take 1 tablet (5  mg total) by mouth as directed. 5 mg today Thursday 05/14/2012  2.5 mg on Friday 05/15/2012  5 Mg on Saturday 05/16/2012  2.5 mg on Sunday 05/17/2012  5 mg on Monday 05/18/2012 - Then you will be seen by Dr Alexandria Lodge        Disposition and follow-up:   Ms.Malaney L Parlier was discharged from [facility] in Stable condition.  At the hospital follow up visit please address    BMET for hyperkalemia   Consider restarting losartan after checking BMET  INR checks for her coumadin therapy  Repeat Chest X ray in 6 weeks  Please review volume status and adjust Furosemide dose as seen fit - discharge wt is 270   Follow-up Appointments: Follow-up Information   Follow up with Bronson Curb, MD On 05/18/2012. (INTERNAL MEDICINE OUTPATIENT CLINIC - Your appointment is on 05/18/2012 at 1:15pm)    Contact information:   [facility] [address] [place] [address] [phone]       Follow up with Quintella Reichert,  MD On 05/18/2012. (CARDIOLOGY - Your appointment is on 05/18/2012 at 10:15 am )    Contact information:   [address] [place] [address] [phone]       Follow up with [facility])    Contact information:   [phone]      Follow up with Gar Ponto, [facility] On 05/18/2012. (COUMADIN CLINIC - Your appointment is on 05/18/2012 at 1:15PM)    Contact information:   [address] [place], [place] [address] [phone]      Discharge Orders   Future Appointments Provider Department Dept Phone   05/18/2012 1:15 PM Bronson Curb, MD [facility] [phone]   05/25/2012 2:00 PM [facility] [facility] [phone]   06/09/2012 10:15 AM Aletta Edouard, MD [facility] [phone]   06/23/2012 8:15 AM Sherrie George, MD [facility] [phone]   Future Orders Complete By Expires     Diet - low sodium heart healthy  As directed     Diet - low sodium heart healthy  As directed     Increase activity slowly  As directed     Increase activity slowly  As directed        Consultations:  None  Procedures Performed:  Dg Chest 2 View  05/12/2012  *RADIOLOGY REPORT*  Clinical Data: Shortness of breath  CHEST - 2 VIEW  Comparison: May 11, 2012  Findings:   The degree of inspiration is somewhat shallow. There is a new small area of infiltrate in the left mid lung.  Elsewhere, the lungs are clear.  Heart is mildly enlarged.  Pulmonary vascularity is within normal limits.  There is stable elevation of the right hemidiaphragm.  No adenopathy.  No bone lesions.  IMPRESSION: Small of infiltrate left mid lung.  Stable elevation of the right hemidiaphragm.  Mild cardiac enlargement is stable.   Original Report Authenticated By: Bretta Bang, M.D.    Dg Chest 2 View  05/11/2012  *RADIOLOGY REPORT*  Clinical Data:  79 year old female shortness of breath atrial fibrillation.  CHEST - 2 VIEW  Comparison: 04/20/2012 and earlier.  Findings: AP and lateral views of the chest.  Stable low lung volumes. Stable cardiomegaly and mediastinal contours.  No pneumothorax, pulmonary edema, pleural effusion or acute pulmonary opacity. Stable visualized osseous  structures.  Numerous upper abdominal surgical clips.  IMPRESSION: No acute cardiopulmonary abnormality.   Original Report Authenticated By: Erskine Speed, M.D.    Dg Chest 2 View  04/20/2012  *RADIOLOGY REPORT*  Clinical Data: Chest pain and shortness of breath  CHEST - 2 VIEW  Comparison: 03/27/2011 and prior chest radiographs  Findings: Upper limits normal heart size and mild peribronchial thickening again noted. Elevation of the right hemidiaphragm is unchanged. There is no evidence of focal airspace disease, pulmonary edema, suspicious pulmonary nodule/mass, pleural effusion, or pneumothorax. No acute bony abnormalities are identified.  IMPRESSION: No evidence of acute cardiopulmonary disease.   Original Report Authenticated By: Harmon Pier, M.D.     Admission HPI: Chief Complaint: Wheezing, mild dyspnea  History of Present Illness:  79yo F with PMH HTN, HLD, DM II, CAD (MI '93), CVA '97, hyperkalemia, hypothyroidism, and CHF on 2L [place] at home presents to the ED per request of her Cardiologist's nurse due to wheezing and dyspnea heard over the phone. The pt states that she was exerting herself when her Cardiologist's office called; they were concerned that she was SOB, and told her to come to the ED. She used her Combivent inhaler, which relieved the wheezing, but she came to the ED anyway via EMS. She states that she was using a Spiriva inhaler but no longer uses it, but she is on Advair and Singulair. The patient denies any change in her breathing, and states that she "was feeling pretty good." She denies any sick contacts, chest pain, N/V. She was recently seen in [facility] and  treated for a gout flare with a prednisone taper, which ended today.  Review of Systems:  A 10 point ROS was performed; pertinent positives and negatives were noted in the HPI  Physical Exam:  Blood pressure 154/48, pulse 54, temperature 98.6 F (37 C), temperature source Oral, resp. rate 15, last menstrual period 05/22/1968, SpO2 98.00%.  General: Alert, well-developed, and cooperative on examination.  Head: Normocephalic and atraumatic.  Eyes: Pupils equal, round, and reactive to light, no injection and anicteric.  Mouth: Pharynx pink and moist.  Neck: Supple, full ROM  Lungs: Mild end expirtatory wheezes R>L, appears to have increased work of breathing (pt states that this is her baseline). Heart: Regular rate, regular rhythm, no murmur, no gallop, and no rub.  Abdomen: Soft, non-tender, non-distended  Msk: No joint swelling, warmth, or erythema.  Extremities: 2+ radial and DP pulses bilaterally. BLE with minimal trace pitting edema.  Neurologic: Alert & oriented X3, non focal  Skin: Turgor normal and no rashes.  Psych: Normal mood and affect  Hospital Course by problem list:  Community acquired pneumonia: On her presentation she had increased wheezing and cough, associated with shortness of breath, which had worsened over a few days. Differentials include COPD exacerbation, versus a pneumonia. Her oxygen remained stable. Her lung exam was marked for mild wheezing bilaterally. 2 view chest x-ray revealed a small area of infiltrate in the left mid lung. Her oxygen saturations remained stable. She was afebrile and without leukocytosis.   Atrial fibrillation with Bradycardia: Patient was on home warfarin for atrial fibrillation, 5mg  po daily. She had previously been tried on diltiazem for rate control, but she developed bradycardia (HR 50s) and this was discontinued. She was evaluated by cardiology outpatient by Dr. Armanda Magic of [facility] on 05/07/2012, who recommended no acute  intervention for bradycardia. Her bradycardia is asymptomatic and happens usually at night time. During this hospitalization similar  episodes were noted on her telemetry with her heart rate being between 40s to 50s without any symptoms. Her Coumadin was temporarily withheld due to a supratherapeutic INR of 6 on admission. Her INR gradually improved to 3.03 on the day of discharge. Dr. Alexandria Lodge recommended to resume coumadin. He will see her again at the coumadin clinic on Monday, 05/18/2012.   COPD exacerbation: This is likely to contribute to her presentation with wheezing and shortness of breath. Chest exam revealed some mild wheezing bilaterally. However, as noted above, her chest x-ray reveals some left mid lung infiltrate with a possible pneumonia. Regardless we treated her as COPD exacerbation, as well. The patient was evaluated by PT who felt that she needs close 24-hour supervision with lots of assistance with her mobility. However, the patient has refused SNF placement. I discussed on the phone with her son regarding her deconditioning, and the need for skilled nursing placement, but he also re-affirmed that his mother is happy in her home. He is confident, that they'll provide the necessary assistance through a home aide and his brother, who lives with the her. At discharge, her prednisone dose was discontinued, and she was discharged on Spiriva, Advair, Singulair, and albuterol and to complete a 7 days of doxycyline. She'll followup as outpatient.    CHF: An ECHO was performed during her last admission 03/2012, which showed EF 55%, normal diastolic function, focal basal hypertrophy of septum, unable to assess LV wall motion. Her proBNP is almost half of what it was at her last admission, so this is likely not a CHF exacerbation. Her physical examination does not reveal fluid overload. As there was a possibility of fluid overload, her home Lasix of 40 mg once daily was increased to 60 mg twice a day during  hospital stay. On discharge, she was reassured and on 40 mg once a day. As noted, losartan was withheld due to hyperkalemia but this can be continued as outpatient.   Transaminitis: AST and ALT both elevated. Previously normal in February. She just completed a course of Prednisone, which can cause elevated AST, ALT, and alkaline phosphatase. For now, we will assume that the prednisone is the culpret. Repeat liver function tests showed some improvement in the level of AST is from 66 to 36, ALT from 81 to 64, and alkaline phosphatase from 132 to 126. No need to monitor further unless clinically indicated.   CAD, nonobstructive: Patient has history of prior MI. In 2006, cath was performed which showed 30-40% stenosis of circumflex, no interventions. Not on a beta blocker due to bradycardia. She will continue with the statin therapy, together with aspirin.   Chronic lung disease, obstructive/restrictive pattern: Patient has obstructive and restrictive pattern of lung disease. Currently on albulterol inhaler, advair, and singulair at home. She statedthat she was longer using the Spiriva. At her discharge, her regimen included albuterol, Advair, Spiriva, and Singulair. She will also continue with her home oxygen.   Hyperkalemia: Resolved: Potassium 5.4 in the ED, without EKG changes. No longer on spironolactone. Possible cause is losartan. Potassium level has remained stable. Hyperkalemia improved to normal levels at the time of discharge. Her losartan was discontinued during hospitalization, but this may be restarted as outpatient if her potassium and creatinine level permit.   Hypertension: Patient's BP 154/48 in ED. On Lasix, Norvasc, and Losartan at home. Spironolactone and her beta blocker were held after last admission 03/2012. During hospitalization, her blood pressures were stable. Her Lasix dose was increased to 60 mg  daily during hospital stay, and she was discharged on her usual dose of 40 mg once  daily. As noted above, her losartan can be continued after evaluation in the outpatient setting.   Diabetes: Last HbA1c 7.0 early last month. On glipizide at home. Of note, patient developed severe itching related to the Lantus when tried in during her hospitalization. She was discharged on her usual home medications.   In regard to other chronic medical conditions including gout, depression, GERD, hyperlipidemia and hypothyroidism, she will be continued with her home medications with outpatient followup.Marland Kitchen  Discharge Vitals:  BP 138/69  Pulse 67  Temp(Src) 97.9 F (36.6 C) (Oral)  Resp 20  Ht 5' 1.81" (1.57 m)  Wt 270 lb 8 oz (122.698 kg)  BMI 49.78 kg/m2  SpO2 99%  LMP 05/22/1968  Discharge Labs:  Results for orders placed during the hospital encounter of 05/11/12 (from the past 24 hour(s))  GLUCOSE, CAPILLARY     Status: Abnormal   Collection Time    05/14/12 11:06 AM      Result Value Range   Glucose-Capillary 208 (*) 70 - 99 mg/dL   Comment 1 Notify RN    GLUCOSE, CAPILLARY     Status: Abnormal   Collection Time    05/14/12  4:03 PM      Result Value Range   Glucose-Capillary 259 (*) 70 - 99 mg/dL    Signed: Dow Adolph 05/15/2012, 6:45 AM   Time Spent on Discharge: Services Ordered on Discharge: [facility] RN  Equipment Ordered on Discharge: None

## 2012-05-14 NOTE — Progress Notes (Signed)
Subjective:  She reported that she had a good night sleep. She was noted to have telemetry in the ranges of 40s to 50's overnight.  Objective: Vital signs in last 24 hours: Filed Vitals:   05/14/12 0523 05/14/12 0939 05/14/12 1400 05/14/12 1426  BP: 119/63 152/53  138/69  Pulse: 70   67  Temp: 98.4 F (36.9 C)   97.9 F (36.6 C)  TempSrc: Oral   Oral  Resp: 20   20  Height:      Weight: 270 lb 8 oz (122.698 kg)     SpO2: 96%  100% 99%   Weight change: -7 lb 4.5 oz (-3.303 kg)  Intake/Output Summary (Last 24 hours) at 05/14/12 1511 Last data filed at 05/14/12 1425  Gross per 24 hour  Intake   1320 ml  Output    325 ml  Net    995 ml   Physical examination General appearance: alert, cooperative, mild distress, moderately obese and she is on 2 L by [place].  Throat: lips, mucosa, and tongue normal; teeth and gums normal Lungs: clear to auscultation bilaterally and with generalised wheezing. Heart: irregular rate and rhythm, S1, S2 normal, no murmur, click, rub or gallop Abdomen: soft, non-tender; bowel sounds normal; no masses,  no organomegaly and obese. She has scratch marks on her abdomen with one of them bleeding from the scratching. Extremities: extremities normal, atraumatic, no cyanosis or edema Pulses: 2+ and symmetric Skin: Skin color, texture, turgor normal. No rashes or lesions Neurologic: Alert and oriented X 3, normal strength and tone. Normal symmetric reflexes. Normal coordination and gait Lab Results: Basic Metabolic Panel:  Recent Labs Lab 05/11/12 1820 05/12/12 0600  NA 137 138  K 5.4* 4.8  CL 99 101  CO2 31 28  GLUCOSE 205* 224*  BUN 28* 26*  CREATININE 0.99 0.85  CALCIUM 10.0 9.6   Liver Function Tests:  Recent Labs Lab 05/11/12 1820 05/12/12 0600  AST 66* 36  ALT 81* 64*  ALKPHOS 132* 126*  BILITOT 0.4 0.4  PROT 7.6 7.0  ALBUMIN 3.3* 3.0*   CBC:  Recent Labs Lab 05/11/12 1820 05/12/12 0600  WBC 9.1 6.3  NEUTROABS 7.1  --   HGB  12.6 11.8*  HCT 38.4 36.5  MCV 93.9 91.7  PLT 320 283   Cardiac Enzymes:  Recent Labs Lab 05/12/12 0600  TROPONINI <0.30   BNP:  Recent Labs Lab 05/11/12 1821  PROBNP 1440.0*   CBG:  Recent Labs Lab 05/13/12 1214 05/13/12 1648 05/13/12 1953 05/14/12 0125 05/14/12 0602 05/14/12 1106  GLUCAP 247* 253* 355* 174* 86 208*   Coagulation:  Recent Labs Lab 05/11/12 1948 05/12/12 0600 05/13/12 0800 05/14/12 0500  LABPROT 49.5* 40.4* 41.8* 29.8*  INR 6.02* 4.57* 4.79* 3.03*      Micro Results: Recent Results (from the past 240 hour(s))  MRSA PCR SCREENING     Status: None   Collection Time    05/12/12  4:19 AM      Result Value Range Status   MRSA by PCR NEGATIVE  NEGATIVE Final   Comment:            The GeneXpert MRSA Assay (FDA     approved for NASAL specimens     only), is one component of a     comprehensive MRSA colonization     surveillance program. It is not     intended to diagnose MRSA     infection nor to guide or  monitor treatment for     MRSA infections.   Studies/Results: No results found. Medications: I have reviewed the patient's current medications. Scheduled Meds: . albuterol  2.5 mg Nebulization TID  . allopurinol  100 mg Oral BID  . amLODipine  5 mg Oral Daily  . aspirin EC  81 mg Oral Daily  . atorvastatin  40 mg Oral q1800  . doxycycline  100 mg Oral Q12H  . FLUoxetine  20 mg Oral BID  . furosemide  60 mg Oral BID  . gabapentin  200 mg Oral QHS  . glipiZIDE  10 mg Oral BID AC  . insulin aspart  0-9 Units Subcutaneous TID WC  . insulin aspart  3 Units Subcutaneous TID WC  . levothyroxine  150 mcg Oral QAC breakfast  . mometasone-formoterol  2 puff Inhalation BID  . montelukast  10 mg Oral QHS  . pantoprazole  40 mg Oral Daily  . predniSONE  50 mg Oral Q breakfast  . tiotropium  18 mcg Inhalation Daily   Continuous Infusions:  PRN Meds:.acetaminophen, acetaminophen, albuterol, alum & mag hydroxide-simeth, morphine  injection, ondansetron (ZOFRAN) IV, ondansetron Assessment/Plan: 79yo F with PMH HTN, HLD, DM II, CAD (MI '93), CVA '97, hyperkalemia, hypothyroidism, and CHF on 2L [place] at home presents to the ED per request of her Cardiologist's nurse due to wheezing heard over the phone.   Community acquired pneumonia: On her presentation she had increased wheezing and cough, associated with shortness of breath, which had worsened over a few days. Differentials include COPD exacerbation, versus a pneumonia. Her oxygen remained stable. Her lung exam was marked for mild wheezing bilaterally. 2 view chest x-ray revealed a small area of infiltrate in the left mid lung. Her oxygen saturations remained stable. She was afebrile and without leukocytosis. Plan. -continue with Doxycycline 100 mg twice a day for 7 days. This is day 4 of treatment. -She will require a followup chest x-ray after 6 weeks. -Will continue to monitor with supplemental oxygen to keep oxygen saturations between 89-93%.  Atrial fibrillation with Bradycardia: Patient was on home warfarin for atrial fibrillation, 5mg  po daily. She had previously been tried on diltiazem for rate control, but she developed bradycardia (HR 50s) and this was discontinued. She was evaluated by cardiology outpatient by Dr. Armanda Magic of [name] on 05/07/2012, who recommended no acute intervention for bradycardia. Her bradycardia is asymptomatic and happens usually at night time. During this hospitalization similar episodes were noted on her telemetry with her heart rate being between 40s to 50s without any symptoms. Her Coumadin was temporarily withheld due to a supratherapeutic INR of 6 on admission. Her INR gradually improved to 3.03 on the day of discharge. Dr. Alexandria Lodge recommended to resume coumadin. He will see her again at the coumadin clinic on Monday, 05/18/2012.   COPD exacerbation: This is likely to contribute to her presentation with wheezing and shortness of  breath. Chest exam revealed some mild wheezing bilaterally. However, as noted above, her chest x-ray reveals some left mid lung infiltrate with a possible pneumonia. Regardless we treated her as COPD exacerbation, as well. At discharge, her prednisone dose was discontinued, and she was discharged on Spiriva, Advair, Singulair, and albuterol. She'll followup as outpatient. The patient was evaluated by PT who felt that she needs close 24-hour supervision with lots of assistance with her mobility. However, the patient has refused SNF placement. I discussed on the phone with her son regarding her deconditioning, and the need for skilled nursing  placement, but he also re-affirmed that his mother is happy in her home. He is confident, that they'll provide the necessary assistance through a home aide and his brother, who lives with the her.  Plan -discharge home on above regimen  CHF: An ECHO was performed during her last admission 03/2012, which showed EF 55%, normal diastolic function, focal basal hypertrophy of septum, unable to assess LV wall motion. Her proBNP is almost half of what it was at her last admission, so this is likely not a CHF exacerbation. Her physical examination does not reveal fluid overload. As there was a possibility of fluid overload, her home Lasix of 40 mg once daily was increased to 60 mg twice a day during hospital stay. On discharge, she was reassured and on 40 mg once a day. As noted, losartan was withheld due to hyperkalemia but this can be continued as outpatient.  Transaminitis: AST and ALT both elevated. Previously normal in February. She just completed a course of Prednisone, which can cause elevated AST, ALT, and alkaline phosphatase. For now, we will assume that the prednisone is the culpret. Repeat liver function tests showed some improvement in the level of AST is from 66 to 36, ALT from 81 to 64, and alkaline phosphatase from 132 to 126. No need to monitor further unless  clinically indicated.  CAD, nonobstructive: Patient has history of prior MI. In 2006, cath was performed which showed 30-40% stenosis of circumflex, no interventions. Not on a beta blocker due to bradycardia. She will continue with the statin therapy, together with aspirin.  Chronic lung disease, obstructive/restrictive pattern: Patient has obstructive and restrictive pattern of lung disease. Currently on albulterol inhaler, advair, and singulair at home. She statedthat she was longer using the Spiriva. At her discharge, her regimen included albuterol, Advair, Spiriva, and Singulair. She will also continue with her home oxygen.   Hyperkalemia: Resolved: Potassium 5.4 in the ED, without EKG changes. No longer on spironolactone. Possible cause is losartan. Potassium level has remained stable. Hyperkalemia improved to normal levels at the time of discharge. Her losartan was discontinued during hospitalization, but this may be restarted as outpatient if her potassium and creatinine level permit.  Hypertension: Patient's BP 154/48 in ED. On Lasix, Norvasc, and Losartan at home. Spironolactone and her beta blocker were held after last admission 03/2012.  During hospitalization, her blood pressures were stable. Her Lasix dose was increased to 60 mg daily during hospital stay, and she was discharged on her usual dose of 40 mg once daily. As noted above, her losartan can be continued after evaluation in the outpatient setting.  Diabetes: Last HbA1c 7.0 early last month. On glipizide at home. Of note, patient developed severe itching related to the Lantus when tried in during her hospitalization. She was discharged on her usual home medications.  Gout: Recently treated for gout flare with prednisone taper. Still c/o pain. Uric acid not at goal (9.8 as of 03/31/12). Per notes from last visit, plan was to start colchicine and allopurinol after finishing the prednisone taper.  Continue with  Allopurinol 100mg  po BID   Depression: Stable.  - Continue fluoxetine  GERD: Stable. On omeprazole at home.  - Protonix 40mg  po daily  Hypothyrodism: Synthroid dose increased on 03/31/12 from 125 to 150 mcg daily. Last TSH 1.277 on 2/24.  - Synthroid po daily  HLD: Last lipid panel with elevated triglycerides to 192 and low HDL to 36, otherwise normal. She is on Lipitor at home, which was  continued on admission.  - Lipitor 40mg  po daily   DVT PPx: No Heparin since she is super-therapeutic on coumadin.   Dispo: Disposition is deferred at this time, awaiting improvement of current medical problems.  Anticipated discharge in approximately 1-2 day(s).   The patient does have a current PCP (BHARDWAJ, SHILPA, MD), therefore will be requiring OPC follow-up after discharge.   The patient does not have transportation limitations that hinder transportation to clinic appointments.  .Services Needed at time of discharge: Y = Yes, Blank = No PT:   OT:   RN:   Equipment:   Other:     LOS: 3 days   Dow Adolph PGY1 - Internal Medicine Teaching Service Pager: [phone] 05/14/2012, 3:11 PM

## 2012-05-14 NOTE — Progress Notes (Signed)
05/14/12 1415 Noted pt. is currently an active pt. of [facility] for [facility] RN and HH PT.  Will receive resumption of care ordes.  NCM will follow for further discharge needs. Tera Mater, RN, BSN NCM [phone]

## 2012-05-14 NOTE — Progress Notes (Signed)
[facility]  Patient Status: Active (receiving services up to time of hospitalization)  [facility] is providing the following services: RN and PT  If patient discharges after hours, please call [phone].   [name] 05/14/2012, 9:36 AM

## 2012-05-15 DIAGNOSIS — R791 Abnormal coagulation profile: Secondary | ICD-10-CM

## 2012-05-15 DIAGNOSIS — R748 Abnormal levels of other serum enzymes: Secondary | ICD-10-CM

## 2012-05-15 DIAGNOSIS — R001 Bradycardia, unspecified: Secondary | ICD-10-CM

## 2012-05-15 HISTORY — DX: Abnormal coagulation profile: R79.1

## 2012-05-18 ENCOUNTER — Ambulatory Visit (INDEPENDENT_AMBULATORY_CARE_PROVIDER_SITE_OTHER): Payer: Medicare PPO | Admitting: Internal Medicine

## 2012-05-18 ENCOUNTER — Ambulatory Visit: Payer: Medicare PPO | Admitting: Internal Medicine

## 2012-05-18 ENCOUNTER — Encounter: Payer: Self-pay | Admitting: Pharmacist

## 2012-05-18 ENCOUNTER — Encounter: Payer: Self-pay | Admitting: Internal Medicine

## 2012-05-18 ENCOUNTER — Ambulatory Visit (INDEPENDENT_AMBULATORY_CARE_PROVIDER_SITE_OTHER): Payer: Medicare PPO | Admitting: Pharmacist

## 2012-05-18 VITALS — BP 131/67 | HR 60 | Temp 97.0°F | Ht 60.0 in | Wt 270.6 lb

## 2012-05-18 DIAGNOSIS — J4489 Other specified chronic obstructive pulmonary disease: Secondary | ICD-10-CM

## 2012-05-18 DIAGNOSIS — I4891 Unspecified atrial fibrillation: Secondary | ICD-10-CM

## 2012-05-18 DIAGNOSIS — I5032 Chronic diastolic (congestive) heart failure: Secondary | ICD-10-CM

## 2012-05-18 DIAGNOSIS — J449 Chronic obstructive pulmonary disease, unspecified: Secondary | ICD-10-CM

## 2012-05-18 DIAGNOSIS — I10 Essential (primary) hypertension: Secondary | ICD-10-CM

## 2012-05-18 DIAGNOSIS — R791 Abnormal coagulation profile: Secondary | ICD-10-CM

## 2012-05-18 DIAGNOSIS — M109 Gout, unspecified: Secondary | ICD-10-CM

## 2012-05-18 DIAGNOSIS — Z7901 Long term (current) use of anticoagulants: Secondary | ICD-10-CM

## 2012-05-18 DIAGNOSIS — I509 Heart failure, unspecified: Secondary | ICD-10-CM

## 2012-05-18 LAB — BASIC METABOLIC PANEL WITH GFR
CO2: 27 mEq/L (ref 19–32)
Calcium: 10.2 mg/dL (ref 8.4–10.5)
Creat: 1.19 mg/dL — ABNORMAL HIGH (ref 0.50–1.10)
GFR, Est African American: 51 mL/min — ABNORMAL LOW
Glucose, Bld: 260 mg/dL — ABNORMAL HIGH (ref 70–99)
Sodium: 137 mEq/L (ref 135–145)

## 2012-05-18 LAB — POCT INR: INR: 2.2

## 2012-05-18 NOTE — Patient Instructions (Signed)
1. Finish your course of antibiotic (doxycycline). 2. Keep using your inhalers. 3. HOLD OFF on taking your Cozaar (losartan) until I call you about your lab results. 4. Keep checking your weight at home.  5. Come back on April 15th for your appointment with your primary care doctor, Dr. Dalphine Handing

## 2012-05-18 NOTE — Addendum Note (Signed)
Addended by: Ignacia Palma on: 05/18/2012 05:44 PM   Modules accepted: Orders

## 2012-05-18 NOTE — Progress Notes (Signed)
Anti-Coagulation Progress Note  [name] is a 79 y.o. female who is currently on an anti-coagulation regimen.    RECENT RESULTS: Recent results are below, the most recent result is correlated with a dose of 35 mg. per week: Lab Results  Component Value Date   INR 2.20 05/18/2012   INR 3.03* 05/14/2012   INR 4.79* 05/13/2012    ANTI-COAG DOSE: Anticoagulation Dose Instructions as of 05/18/2012     Glynis Smiles Tue Wed Thu Fri Sat   New Dose 5 mg 5 mg 5 mg 5 mg 5 mg 5 mg 5 mg       ANTICOAG SUMMARY: Anticoagulation Episode Summary   Current INR goal 2.0-3.0  Next INR check 06/08/2012  INR from last check 2.20 (05/18/2012)  Weekly max dose   Target end date Indefinite  INR check location   Preferred lab   Send INR reminders to ANTICOAG IMP   Indications  Long-term (current) use of anticoagulants [V58.61] AF (atrial fibrillation) (Resolved) [427.31]        Comments       Anticoagulation Care Providers   Provider Role Specialty Phone number   Ulyess Mort, MD  Internal Medicine [phone]      ANTICOAG TODAY: Anticoagulation Summary as of 05/18/2012   INR goal 2.0-3.0  Selected INR 2.20 (05/18/2012)  Next INR check 06/08/2012  Target end date Indefinite   Indications  Long-term (current) use of anticoagulants [V58.61] AF (atrial fibrillation) (Resolved) [427.31]      Anticoagulation Episode Summary   INR check location    Preferred lab    Send INR reminders to ANTICOAG IMP   Comments     Anticoagulation Care Providers   Provider Role Specialty Phone number   Ulyess Mort, MD  Internal Medicine [phone]      PATIENT INSTRUCTIONS: Patient Instructions  Patient instructed to take medications as defined in the Anti-coagulation Track section of this encounter.  Patient instructed to take today's dose.  Patient verbalized understanding of these instructions.       FOLLOW-UP Return in 3 weeks (on 06/08/2012) for Follow up INR at 1100h.  Hulen Luster, III Pharm.D., CACP

## 2012-05-18 NOTE — Patient Instructions (Signed)
Patient instructed to take medications as defined in the Anti-coagulation Track section of this encounter.  Patient instructed to take today's dose.  Patient verbalized understanding of these instructions.    

## 2012-05-18 NOTE — Assessment & Plan Note (Signed)
O2 dependent on 2L [place]. Exacerbation for which she was recently discharged seems to be resolving. Says breathing at baseline, using inhalers, and continuing doxycycline which was Rx'ed at discharge. Will need a repeat CXR in early May for resolution of possible CAP. - Continue inhalers, appropriate usage reviewed today - Warning s/s discussed

## 2012-05-18 NOTE — Assessment & Plan Note (Signed)
Steroids more typically will cause a hypoprothrombinemic response, i.e. Will ELEVATE/INCREASE the INR

## 2012-05-18 NOTE — Assessment & Plan Note (Signed)
Stable on allopurinol, no acute flares.

## 2012-05-18 NOTE — Progress Notes (Signed)
Patient ID: [name], female   DOB: 01-May-1932, 79 y.o.   MRN: [id number]  Subjective:   Patient ID: [name] female   DOB: [birth year] 79 y.o.   MRN: [id number]  HPI: [name] is a 80 y.o. female with history of oxygen dependent COPD (on 2L [place]), controlled DM2, atrial fibrillation and bradyarrythmia on chronic coumadin, CAD (prior MI 20 years ago), CVD (without residual deficits) who presents to the clinic for hospital follow up after admission for CAP and COPD exacerbation. She was discharged on spiriva, advair, and singulair for COPD exac and  lasix 40mg  BID for her heart failure. Losartan held on discharge due to concern for hyperkalemia. Today she says her breathing is at baseline. Denies worsening SOB, chest pain, or cough. Denies worsening LE edema. No fever, chills, nausea or vomiting. Has had 1 loose stool today which is chronic for her. Discharge weight from hospital 4 days ago was 270. Is weighing herself at home and says weight is stable She also has history of afib with bradyarrhythmia, possible sick sinus syndrome. She is on warfarin and follows with Dr. Alexandria Lodge. Not on any rate control agents due to bradyarrhythmia.  She says her gout is stable.   Past Medical History  Diagnosis Date  . CAD (coronary artery disease)     s/p remote MI (1993)  . Abdominal pain     recent admission, likely secondary to presbyesophagus and gastric dysmotility  . CVD (cardiovascular disease)   . Chronic diastolic heart failure     with dilated cardiomyopathy, last EF 55%(09/2006)  . Respiratory failure, chronic     mixed etiology with bronchospastic component  . Urinary incontinence     recurrent uti's/resistance to cipro, bactrim  . Gout   . Postablative hypothyroidism     H/o Graves disease s/p radioactive iodine ablation with resultant postablative hypothyroidism  . Hyperlipidemia   . Depression   . Morbidly obese     s/p gastric plication surgery  . Gastroesophageal  reflux disease   . Ventral hernia     repair in April 2008, complicated by MRSA abdominal wall cellulitis  . DVT (deep venous thrombosis) 1998  . Pulmonary embolism 1998    1998, s/p [place] filter  . Diabetes mellitus type 2, controlled, with complications   . Hypertension   . History of recurrent UTIs     and history of pyelonephritis  . AF (atrial fibrillation)     on chronic warfarin  . Osteoarthritis   . Myocardial infarction   . Stroke 1997    denies residual  . Anemia     Blood transfusion [V58.2]  . Supratherapeutic INR 05/15/2012    Possibly induced by recent steroid use.    Current Outpatient Prescriptions  Medication Sig Dispense Refill  . acetaminophen (TYLENOL) 500 MG tablet Take 1,000 mg by mouth 2 (two) times daily as needed. For pain.      Marland Kitchen albuterol (PROVENTIL HFA) 108 (90 BASE) MCG/ACT inhaler Inhale 2 puffs into the lungs every 6 (six) hours as needed. For wheezing.  3.7 g  11  . allopurinol (ZYLOPRIM) 100 MG tablet Take 100 mg by mouth 2 (two) times daily.      Marland Kitchen amLODipine (NORVASC) 5 MG tablet Take 5 mg by mouth daily.      Marland Kitchen aspirin EC 81 MG EC tablet Take 1 tablet (81 mg total) by mouth daily.      Marland Kitchen atorvastatin (LIPITOR) 40 MG tablet TAKE ONE TABLET  BY MOUTH EVERY DAY  30 tablet  4  . doxycycline (VIBRA-TABS) 100 MG tablet Take 1 tablet (100 mg total) by mouth 2 (two) times daily.  7 tablet  0  . FLUoxetine (PROZAC) 20 MG capsule Take 20 mg by mouth 2 (two) times daily.      . Fluticasone-Salmeterol (ADVAIR) 250-50 MCG/DOSE AEPB Inhale 1 puff into the lungs every 12 (twelve) hours.  60 each  3  . furosemide (LASIX) 20 MG tablet Take 2 tablets (40 mg total) by mouth 2 (two) times daily.  120 tablet  1  . gabapentin (NEURONTIN) 100 MG capsule TAKE TWO CAPSULES BY MOUTH TWICE DAILY  120 capsule  2  . glipiZIDE (GLUCOTROL) 10 MG tablet Take 10 mg by mouth 2 (two) times daily before a meal.      . levothyroxine (SYNTHROID) 150 MCG tablet Take 1 tablet (150  mcg total) by mouth daily.  30 tablet  11  . loperamide (IMODIUM) 2 MG capsule Take 1 capsule (2 mg total) by mouth as needed for diarrhea or loose stools (up to total 16mg  daily).  8 capsule  0  . montelukast (SINGULAIR) 10 MG tablet TAKE ONE TABLET BY MOUTH EVERY DAY  30 tablet  6  . omeprazole (PRILOSEC) 20 MG capsule Take 40 mg by mouth 2 (two) times daily.      Marland Kitchen tiotropium (SPIRIVA HANDIHALER) 18 MCG inhalation capsule Place 1 capsule (18 mcg total) into inhaler and inhale daily.  30 capsule  4  . warfarin (COUMADIN) 5 MG tablet Take 1 tablet (5 mg total) by mouth as directed. 5 mg today Thursday 05/14/2012 2.5 mg on Friday 05/15/2012 5 Mg on Saturday 05/16/2012 2.5 mg on Sunday 05/17/2012 5 mg on Monday 05/18/2012 - Then you will be seen by Dr Alexandria Lodge  6 tablet  0   No current facility-administered medications for this visit.   Family History  Problem Relation Age of Onset  . Colon cancer Neg Hx    History   Social History  . Marital Status: Widowed    Spouse Name: N/A    Number of Children: N/A  . Years of Education: N/A   Social History Main Topics  . Smoking status: Former Smoker -- 1.00 packs/day for 1.5 years    Types: Cigarettes  . Smokeless tobacco: Never Used  . Alcohol Use: No  . Drug Use: No  . Sexually Active: No   Other Topics Concern  . Not on file   Social History Narrative   She lives w/her son. Her son offers help and she has an aid who occasionally cooks for her and takes care of some other tasks around the pt's house.   Has medicare. Has son in [place] who is POA   Ambivalent about DNR issues and does not want a STOP sign posted in home         Review of Systems: 10 pt ROS performed, pertinent positives and negatives noted in HPI .lasta1 Objective:  Physical Exam: There were no vitals filed for this visit. Vitals reviewed. General: sitting in wheelchair, wearing O2, NAD HEENT: EOMI, no scleral icterus Cardiac: Irreg irreg rhythm w HR 70s, no rubs,  murmurs or gallops Pulm: clear to auscultation bilaterally, no rales or wheezes Abd: soft, nontender, nondistended, BS present Ext: trace pedal edema   Assessment & Plan:   Please see problem-based charting for assessment and plan.

## 2012-05-18 NOTE — Assessment & Plan Note (Signed)
Likely sick sinus syndrome. Rate controlling agents d/c'd due to bradycardia. On warfarin for anticoag. No acute bleeding.  HR 70s and irreg irreg on my exam. - Continue warfarin, f/u w Dr. Alexandria Lodge

## 2012-05-18 NOTE — Assessment & Plan Note (Addendum)
At baseline weight 270lbs with no evidence of hypervolemia. Denies worsening dyspnea. On Lasix 40mg  BID. - Check Bmet today - Continue lasix 40mg  BID - Holding cozaar pending Bmet/resolution hyperK  ADDENDUM 05/18/12 Bmet shows K 5.5 but is hemolyzed sample. Sent msg to front desk to call patient back for lab only visit tomorrow for repeat Bmet.   ADDENDUM 05/19/12 Repeat Bmet nonhemolyzed, K wnl, Cr is returning to baseline (around 1.1). Patient instructed to restart Cozaar.

## 2012-05-18 NOTE — Assessment & Plan Note (Signed)
BP Readings from Last 3 Encounters:  05/18/12 131/67  05/14/12 138/69  05/07/12 110/70    Lab Results  Component Value Date   NA 138 05/12/2012   K 4.8 05/12/2012   CREATININE 0.85 05/12/2012    Assessment:  Blood pressure control: controlled  Progress toward BP goal:  at goal   Plan:  Medications:  continue current medications *

## 2012-05-19 ENCOUNTER — Other Ambulatory Visit: Payer: Medicare PPO

## 2012-05-19 DIAGNOSIS — I5032 Chronic diastolic (congestive) heart failure: Secondary | ICD-10-CM

## 2012-05-19 LAB — BASIC METABOLIC PANEL WITH GFR
BUN: 25 mg/dL — ABNORMAL HIGH (ref 6–23)
Calcium: 9.8 mg/dL (ref 8.4–10.5)
GFR, Est African American: 54 mL/min — ABNORMAL LOW
GFR, Est Non African American: 47 mL/min — ABNORMAL LOW
Glucose, Bld: 335 mg/dL — ABNORMAL HIGH (ref 70–99)
Potassium: 4.5 mEq/L (ref 3.5–5.3)

## 2012-05-20 ENCOUNTER — Other Ambulatory Visit: Payer: Self-pay | Admitting: Internal Medicine

## 2012-05-23 ENCOUNTER — Emergency Department (HOSPITAL_COMMUNITY): Payer: Medicare PPO

## 2012-05-23 ENCOUNTER — Inpatient Hospital Stay (HOSPITAL_COMMUNITY)
Admission: EM | Admit: 2012-05-23 | Discharge: 2012-05-27 | DRG: 292 | Disposition: A | Discharge status: Skilled Nursing Facility | Payer: Medicare PPO | Attending: Internal Medicine | Admitting: Internal Medicine

## 2012-05-23 ENCOUNTER — Encounter (HOSPITAL_COMMUNITY): Payer: Self-pay | Admitting: Emergency Medicine

## 2012-05-23 DIAGNOSIS — J4489 Other specified chronic obstructive pulmonary disease: Secondary | ICD-10-CM | POA: Diagnosis present

## 2012-05-23 DIAGNOSIS — I350 Nonrheumatic aortic (valve) stenosis: Secondary | ICD-10-CM | POA: Diagnosis present

## 2012-05-23 DIAGNOSIS — I4891 Unspecified atrial fibrillation: Secondary | ICD-10-CM | POA: Diagnosis present

## 2012-05-23 DIAGNOSIS — J9610 Chronic respiratory failure, unspecified whether with hypoxia or hypercapnia: Secondary | ICD-10-CM | POA: Diagnosis present

## 2012-05-23 DIAGNOSIS — Z86718 Personal history of other venous thrombosis and embolism: Secondary | ICD-10-CM

## 2012-05-23 DIAGNOSIS — M109 Gout, unspecified: Secondary | ICD-10-CM | POA: Diagnosis present

## 2012-05-23 DIAGNOSIS — I5033 Acute on chronic diastolic (congestive) heart failure: Principal | ICD-10-CM | POA: Diagnosis present

## 2012-05-23 DIAGNOSIS — R001 Bradycardia, unspecified: Secondary | ICD-10-CM

## 2012-05-23 DIAGNOSIS — Z86711 Personal history of pulmonary embolism: Secondary | ICD-10-CM

## 2012-05-23 DIAGNOSIS — Z8673 Personal history of transient ischemic attack (TIA), and cerebral infarction without residual deficits: Secondary | ICD-10-CM

## 2012-05-23 DIAGNOSIS — Z87891 Personal history of nicotine dependence: Secondary | ICD-10-CM

## 2012-05-23 DIAGNOSIS — F3289 Other specified depressive episodes: Secondary | ICD-10-CM | POA: Diagnosis present

## 2012-05-23 DIAGNOSIS — E119 Type 2 diabetes mellitus without complications: Secondary | ICD-10-CM | POA: Diagnosis present

## 2012-05-23 DIAGNOSIS — Z91199 Patient's noncompliance with other medical treatment and regimen due to unspecified reason: Secondary | ICD-10-CM

## 2012-05-23 DIAGNOSIS — I509 Heart failure, unspecified: Secondary | ICD-10-CM | POA: Diagnosis present

## 2012-05-23 DIAGNOSIS — Z79899 Other long term (current) drug therapy: Secondary | ICD-10-CM

## 2012-05-23 DIAGNOSIS — E890 Postprocedural hypothyroidism: Secondary | ICD-10-CM | POA: Diagnosis present

## 2012-05-23 DIAGNOSIS — R0602 Shortness of breath: Secondary | ICD-10-CM

## 2012-05-23 DIAGNOSIS — E039 Hypothyroidism, unspecified: Secondary | ICD-10-CM | POA: Diagnosis present

## 2012-05-23 DIAGNOSIS — K219 Gastro-esophageal reflux disease without esophagitis: Secondary | ICD-10-CM | POA: Diagnosis present

## 2012-05-23 DIAGNOSIS — I359 Nonrheumatic aortic valve disorder, unspecified: Secondary | ICD-10-CM | POA: Diagnosis present

## 2012-05-23 DIAGNOSIS — E0500 Thyrotoxicosis with diffuse goiter without thyrotoxic crisis or storm: Secondary | ICD-10-CM | POA: Diagnosis present

## 2012-05-23 DIAGNOSIS — Z7901 Long term (current) use of anticoagulants: Secondary | ICD-10-CM

## 2012-05-23 DIAGNOSIS — D72829 Elevated white blood cell count, unspecified: Secondary | ICD-10-CM | POA: Diagnosis present

## 2012-05-23 DIAGNOSIS — J189 Pneumonia, unspecified organism: Secondary | ICD-10-CM | POA: Diagnosis present

## 2012-05-23 DIAGNOSIS — T380X5A Adverse effect of glucocorticoids and synthetic analogues, initial encounter: Secondary | ICD-10-CM | POA: Diagnosis present

## 2012-05-23 DIAGNOSIS — F329 Major depressive disorder, single episode, unspecified: Secondary | ICD-10-CM | POA: Diagnosis present

## 2012-05-23 DIAGNOSIS — Z9884 Bariatric surgery status: Secondary | ICD-10-CM

## 2012-05-23 DIAGNOSIS — J449 Chronic obstructive pulmonary disease, unspecified: Secondary | ICD-10-CM | POA: Diagnosis present

## 2012-05-23 DIAGNOSIS — Z794 Long term (current) use of insulin: Secondary | ICD-10-CM

## 2012-05-23 DIAGNOSIS — I2510 Atherosclerotic heart disease of native coronary artery without angina pectoris: Secondary | ICD-10-CM | POA: Diagnosis present

## 2012-05-23 DIAGNOSIS — M1990 Unspecified osteoarthritis, unspecified site: Secondary | ICD-10-CM | POA: Diagnosis present

## 2012-05-23 DIAGNOSIS — E785 Hyperlipidemia, unspecified: Secondary | ICD-10-CM | POA: Diagnosis present

## 2012-05-23 DIAGNOSIS — I252 Old myocardial infarction: Secondary | ICD-10-CM

## 2012-05-23 DIAGNOSIS — I10 Essential (primary) hypertension: Secondary | ICD-10-CM | POA: Diagnosis present

## 2012-05-23 DIAGNOSIS — I495 Sick sinus syndrome: Secondary | ICD-10-CM | POA: Diagnosis present

## 2012-05-23 DIAGNOSIS — Z6841 Body Mass Index (BMI) 40.0 and over, adult: Secondary | ICD-10-CM

## 2012-05-23 DIAGNOSIS — Z9119 Patient's noncompliance with other medical treatment and regimen: Secondary | ICD-10-CM

## 2012-05-23 HISTORY — DX: Atherosclerotic heart disease of native coronary artery without angina pectoris: I25.10

## 2012-05-23 HISTORY — DX: Essential (primary) hypertension: I10

## 2012-05-23 LAB — POCT I-STAT TROPONIN I: Troponin i, poc: 0.01 ng/mL (ref 0.00–0.08)

## 2012-05-23 LAB — PRO B NATRIURETIC PEPTIDE: Pro B Natriuretic peptide (BNP): 3018 pg/mL — ABNORMAL HIGH (ref 0–450)

## 2012-05-23 LAB — MAGNESIUM: Magnesium: 2 mg/dL (ref 1.5–2.5)

## 2012-05-23 LAB — COMPREHENSIVE METABOLIC PANEL
Albumin: 3.1 g/dL — ABNORMAL LOW (ref 3.5–5.2)
Alkaline Phosphatase: 136 U/L — ABNORMAL HIGH (ref 39–117)
BUN: 16 mg/dL (ref 6–23)
Potassium: 4.3 mEq/L (ref 3.5–5.1)
Sodium: 134 mEq/L — ABNORMAL LOW (ref 135–145)
Total Protein: 7.1 g/dL (ref 6.0–8.3)

## 2012-05-23 LAB — APTT: aPTT: 49 seconds — ABNORMAL HIGH (ref 24–37)

## 2012-05-23 LAB — CBC
MCHC: 33.9 g/dL (ref 30.0–36.0)
Platelets: 284 10*3/uL (ref 150–400)
RDW: 14.7 % (ref 11.5–15.5)

## 2012-05-23 MED ORDER — LEVOFLOXACIN IN D5W 750 MG/150ML IV SOLN
750.0000 mg | Freq: Once | INTRAVENOUS | Status: AC
  Administered 2012-05-23: 750 mg via INTRAVENOUS
  Filled 2012-05-23: qty 150

## 2012-05-23 MED ORDER — SODIUM CHLORIDE 0.9 % IJ SOLN: 3.0000 mL | INTRAMUSCULAR | Status: DC | PRN

## 2012-05-23 MED ORDER — MONTELUKAST SODIUM 10 MG PO TABS
10.0000 mg | ORAL_TABLET | Freq: Every day | ORAL | Status: DC
  Administered 2012-05-23 – 2012-05-26 (×4): 10 mg via ORAL
  Filled 2012-05-23 (×5): qty 1

## 2012-05-23 MED ORDER — FUROSEMIDE 10 MG/ML IJ SOLN
40.0000 mg | Freq: Two times a day (BID) | INTRAMUSCULAR | Status: DC
  Administered 2012-05-23 – 2012-05-24 (×3): 40 mg via INTRAVENOUS
  Filled 2012-05-23 (×4): qty 4

## 2012-05-23 MED ORDER — INSULIN ASPART 100 UNIT/ML ~~LOC~~ SOLN
0.0000 [IU] | Freq: Three times a day (TID) | SUBCUTANEOUS | Status: DC
  Administered 2012-05-23: 2 [IU] via SUBCUTANEOUS
  Administered 2012-05-23: 1 [IU] via SUBCUTANEOUS
  Administered 2012-05-24 – 2012-05-25 (×4): 2 [IU] via SUBCUTANEOUS
  Administered 2012-05-25: 1 [IU] via SUBCUTANEOUS
  Administered 2012-05-25: 2 [IU] via SUBCUTANEOUS
  Administered 2012-05-26: 3 [IU] via SUBCUTANEOUS
  Administered 2012-05-26: 1 [IU] via SUBCUTANEOUS
  Administered 2012-05-26: 2 [IU] via SUBCUTANEOUS
  Administered 2012-05-27: 1 [IU] via SUBCUTANEOUS
  Administered 2012-05-27: 3 [IU] via SUBCUTANEOUS
  Administered 2012-05-27: 2 [IU] via SUBCUTANEOUS

## 2012-05-23 MED ORDER — AMLODIPINE BESYLATE 5 MG PO TABS
5.0000 mg | ORAL_TABLET | Freq: Every day | ORAL | Status: DC
  Administered 2012-05-23 – 2012-05-27 (×5): 5 mg via ORAL
  Filled 2012-05-23 (×5): qty 1

## 2012-05-23 MED ORDER — INSULIN ASPART 100 UNIT/ML ~~LOC~~ SOLN: 0.0000 [IU] | Freq: Every day | SUBCUTANEOUS | Status: DC

## 2012-05-23 MED ORDER — WARFARIN - PHYSICIAN DOSING INPATIENT: Freq: Every day | Status: DC

## 2012-05-23 MED ORDER — FLUOXETINE HCL 20 MG PO CAPS
20.0000 mg | ORAL_CAPSULE | Freq: Two times a day (BID) | ORAL | Status: DC
  Administered 2012-05-23 – 2012-05-27 (×9): 20 mg via ORAL
  Filled 2012-05-23 (×10): qty 1

## 2012-05-23 MED ORDER — PANTOPRAZOLE SODIUM 40 MG PO TBEC
40.0000 mg | DELAYED_RELEASE_TABLET | Freq: Every day | ORAL | Status: DC
  Administered 2012-05-23 – 2012-05-27 (×5): 40 mg via ORAL
  Filled 2012-05-23 (×5): qty 1

## 2012-05-23 MED ORDER — LEVOTHYROXINE SODIUM 150 MCG PO TABS
150.0000 ug | ORAL_TABLET | Freq: Every day | ORAL | Status: DC
  Administered 2012-05-23 – 2012-05-27 (×5): 150 ug via ORAL
  Filled 2012-05-23 (×6): qty 1

## 2012-05-23 MED ORDER — ALLOPURINOL 100 MG PO TABS
100.0000 mg | ORAL_TABLET | Freq: Two times a day (BID) | ORAL | Status: DC
  Administered 2012-05-23 – 2012-05-27 (×9): 100 mg via ORAL
  Filled 2012-05-23 (×10): qty 1

## 2012-05-23 MED ORDER — ALBUTEROL SULFATE HFA 108 (90 BASE) MCG/ACT IN AERS: 2.0000 | INHALATION_SPRAY | Freq: Four times a day (QID) | RESPIRATORY_TRACT | Status: DC | PRN

## 2012-05-23 MED ORDER — LOPERAMIDE HCL 2 MG PO CAPS: 2.0000 mg | ORAL_CAPSULE | ORAL | Status: DC | PRN

## 2012-05-23 MED ORDER — IPRATROPIUM BROMIDE 0.02 % IN SOLN
0.5000 mg | Freq: Four times a day (QID) | RESPIRATORY_TRACT | Status: DC
  Administered 2012-05-23 – 2012-05-24 (×5): 0.5 mg via RESPIRATORY_TRACT
  Filled 2012-05-23 (×5): qty 2.5

## 2012-05-23 MED ORDER — LOSARTAN POTASSIUM 50 MG PO TABS
50.0000 mg | ORAL_TABLET | Freq: Every day | ORAL | Status: DC
  Administered 2012-05-23 – 2012-05-27 (×5): 50 mg via ORAL
  Filled 2012-05-23 (×5): qty 1

## 2012-05-23 MED ORDER — SODIUM CHLORIDE 0.9 % IV SOLN: 250.0000 mL | INTRAVENOUS | Status: DC | PRN

## 2012-05-23 MED ORDER — ATORVASTATIN CALCIUM 40 MG PO TABS
40.0000 mg | ORAL_TABLET | Freq: Every day | ORAL | Status: DC
  Administered 2012-05-23 – 2012-05-27 (×5): 40 mg via ORAL
  Filled 2012-05-23 (×5): qty 1

## 2012-05-23 MED ORDER — ASPIRIN EC 81 MG PO TBEC
81.0000 mg | DELAYED_RELEASE_TABLET | Freq: Every day | ORAL | Status: DC
  Administered 2012-05-23 – 2012-05-27 (×5): 81 mg via ORAL
  Filled 2012-05-23 (×5): qty 1

## 2012-05-23 MED ORDER — MOMETASONE FURO-FORMOTEROL FUM 100-5 MCG/ACT IN AERO
2.0000 | INHALATION_SPRAY | Freq: Two times a day (BID) | RESPIRATORY_TRACT | Status: DC
  Administered 2012-05-23 – 2012-05-27 (×9): 2 via RESPIRATORY_TRACT
  Filled 2012-05-23: qty 8.8

## 2012-05-23 MED ORDER — FUROSEMIDE 10 MG/ML IJ SOLN
40.0000 mg | Freq: Once | INTRAMUSCULAR | Status: AC
  Administered 2012-05-23: 40 mg via INTRAVENOUS
  Filled 2012-05-23: qty 4

## 2012-05-23 MED ORDER — SODIUM CHLORIDE 0.9 % IJ SOLN
3.0000 mL | Freq: Two times a day (BID) | INTRAMUSCULAR | Status: DC
  Administered 2012-05-24 – 2012-05-26 (×3): 3 mL via INTRAVENOUS

## 2012-05-23 MED ORDER — SODIUM CHLORIDE 0.9 % IJ SOLN
3.0000 mL | Freq: Two times a day (BID) | INTRAMUSCULAR | Status: DC
  Administered 2012-05-23 – 2012-05-27 (×8): 3 mL via INTRAVENOUS

## 2012-05-23 MED ORDER — GABAPENTIN 100 MG PO CAPS
100.0000 mg | ORAL_CAPSULE | Freq: Two times a day (BID) | ORAL | Status: DC
  Administered 2012-05-23 – 2012-05-27 (×9): 100 mg via ORAL
  Filled 2012-05-23 (×10): qty 1

## 2012-05-23 MED ORDER — ACETAMINOPHEN 500 MG PO TABS
1000.0000 mg | ORAL_TABLET | Freq: Three times a day (TID) | ORAL | Status: DC | PRN
  Administered 2012-05-23 – 2012-05-27 (×2): 1000 mg via ORAL
  Filled 2012-05-23 (×3): qty 2

## 2012-05-23 MED ORDER — VANCOMYCIN HCL IN DEXTROSE 1-5 GM/200ML-% IV SOLN
1000.0000 mg | Freq: Once | INTRAVENOUS | Status: AC
  Administered 2012-05-23: 1000 mg via INTRAVENOUS
  Filled 2012-05-23: qty 200

## 2012-05-23 MED ORDER — HEPARIN SODIUM (PORCINE) 5000 UNIT/ML IJ SOLN
5000.0000 [IU] | Freq: Three times a day (TID) | INTRAMUSCULAR | Status: DC
  Administered 2012-05-23: 5000 [IU] via SUBCUTANEOUS
  Filled 2012-05-23 (×4): qty 1

## 2012-05-23 MED ORDER — WARFARIN SODIUM 5 MG PO TABS
5.0000 mg | ORAL_TABLET | Freq: Every day | ORAL | Status: DC
  Administered 2012-05-23 – 2012-05-27 (×5): 5 mg via ORAL
  Filled 2012-05-23 (×7): qty 1

## 2012-05-23 NOTE — H&P (Signed)
Hospital Admission Note Date: 05/23/2012  Patient name: [name] Medical record number: [id number] Date of birth: September 30, 1932 Age: 79 y.o. Gender: female PCP: Aletta Edouard, MD  Medical Service: Internal Medicine Teaching Service  Attending physician:  Dr. Dalphine Handing   1st Contact:  Dr. Zada Girt  Pager:[phone] 2nd Contact:  Dr. Clyde Lundborg   Pager:[phone] After 5 pm or weekends: 1st Contact:      Pager: [phone] 2nd Contact:      Pager: [phone]  Chief Complaint: Weakness, shortness of breath  History of Present Illness: [name] is a 79 year old woman with PMH of DM2, hyperlipidemia, HTN, A. Fib on warfarin, history of PE, CHF (EF of 55% per 2D echo on 04/21/12 ), CAD (MI 20 yrs ago, cath in 2006 with nonobstructive disease, 30-40% stenosis on circumflex), severe aortic stenosis (mild regurgitation, 0.57cm^2 area per last 2D echo), CVA (with no residual deficit), and COPD who presents to the [facility] ED via EMS after not being able to get up form bed earlier this morning due to generalized weakness and shortness of breath. She was recently hospitalized and treated for community acquired pneumonia and CHF exacerbation with discharge on 05/14/12. She reports taking all her medicines as prescribed with last dose of doxycyline two days ago. Her cough has improved and is now intermittent with scant white sputum. Her appetite is good and she denies dietary indiscretion. She weighs herself everyday but does not know if her weight has increased recently. She lives at home with her son who works during the day but she has a Therapist, art during the week from 9AM to 1PM and a physical therapist that comes 2-3 per week after 1PM. She walks with a walker at home, she denies falls.   She denies confusion, headache, slurred speech, chest pain, abdominal pain, nausea, vomiting, diarrhea, constipation, dysuria, or hematuria. She had nosebleed with minimum blood loss a few days ago but denies overt bleeding.    Meds: Current Outpatient Rx  Name  Route  Sig  Dispense  Refill  . acetaminophen (TYLENOL) 500 MG tablet   Oral   Take 1,000 mg by mouth 2 (two) times daily as needed. For pain.         Marland Kitchen albuterol (PROVENTIL HFA) 108 (90 BASE) MCG/ACT inhaler   Inhalation   Inhale 2 puffs into the lungs every 6 (six) hours as needed. For wheezing.   3.7 g   11   . allopurinol (ZYLOPRIM) 100 MG tablet   Oral   Take 100 mg by mouth 2 (two) times daily.         Marland Kitchen amLODipine (NORVASC) 5 MG tablet   Oral   Take 5 mg by mouth daily.         Marland Kitchen aspirin EC 81 MG EC tablet   Oral   Take 1 tablet (81 mg total) by mouth daily.         Marland Kitchen atorvastatin (LIPITOR) 40 MG tablet   Oral   Take 40 mg by mouth daily.         Marland Kitchen FLUoxetine (PROZAC) 20 MG capsule   Oral   Take 20 mg by mouth 2 (two) times daily.         . Fluticasone-Salmeterol (ADVAIR) 250-50 MCG/DOSE AEPB   Inhalation   Inhale 1 puff into the lungs every 12 (twelve) hours.   60 each   3   . gabapentin (NEURONTIN) 100 MG capsule   Oral   Take 100 mg by  mouth 2 (two) times daily.         Marland Kitchen levothyroxine (SYNTHROID) 150 MCG tablet   Oral   Take 1 tablet (150 mcg total) by mouth daily.   30 tablet   11   . loperamide (IMODIUM) 2 MG capsule   Oral   Take 1 capsule (2 mg total) by mouth as needed for diarrhea or loose stools (up to total 16mg  daily).   8 capsule   0   . losartan (COZAAR) 50 MG tablet   Oral   Take 50 mg by mouth daily.         . montelukast (SINGULAIR) 10 MG tablet   Oral   Take 10 mg by mouth at bedtime.         Marland Kitchen omeprazole (PRILOSEC) 20 MG capsule   Oral   Take 40 mg by mouth 2 (two) times daily.         Marland Kitchen tiotropium (SPIRIVA HANDIHALER) 18 MCG inhalation capsule   Inhalation   Place 1 capsule (18 mcg total) into inhaler and inhale daily.   30 capsule   4   . warfarin (COUMADIN) 5 MG tablet   Oral   Take 5 mg by mouth daily.           Allergies: Allergies as of  05/23/2012 - Review Complete 05/23/2012  Allergen Reaction Noted  . Lantus (insulin glargine)  05/13/2012  . Nitrofurantoin (macrodantin) Itching 04/20/2010   Past Medical History  Diagnosis Date  . CAD (coronary artery disease)     s/p remote MI (1993)  . Abdominal pain     recent admission, likely secondary to presbyesophagus and gastric dysmotility  . CVD (cardiovascular disease)   . Chronic diastolic heart failure     with dilated cardiomyopathy, last EF 55%(09/2006)  . Respiratory failure, chronic     mixed etiology with bronchospastic component  . Urinary incontinence     recurrent uti's/resistance to cipro, bactrim  . Gout   . Postablative hypothyroidism     H/o Graves disease s/p radioactive iodine ablation with resultant postablative hypothyroidism  . Hyperlipidemia   . Depression   . Morbidly obese     s/p gastric plication surgery  . Gastroesophageal reflux disease   . Ventral hernia     repair in April 2008, complicated by MRSA abdominal wall cellulitis  . DVT (deep venous thrombosis) 1998  . Pulmonary embolism 1998    1998, s/p Greenfield filter  . Diabetes mellitus type 2, controlled, with complications   . Hypertension   . History of recurrent UTIs     and history of pyelonephritis  . AF (atrial fibrillation)     on chronic warfarin  . Osteoarthritis   . Myocardial infarction   . Stroke 1997    denies residual  . Anemia     Blood transfusion [V58.2]  . Supratherapeutic INR 05/15/2012    Possibly induced by recent steroid use.    Past Surgical History  Procedure Laterality Date  . Cholecystectomy    . Appendectomy    . Breast biopsy    . Gastric bypass  1970's  . Hernia repair  05/2006    ventral hernia  . Greenfield filter placement  1998   Family History  Problem Relation Age of Onset  . Colon cancer Neg Hx    History   Social History  . Marital Status: Widowed    Spouse Name: N/A    Number of Children: N/A  . Years of  Education: N/A    Occupational History  . Not on file.   Social History Main Topics  . Smoking status: Former Smoker -- 1.00 packs/day for 1.5 years    Types: Cigarettes  . Smokeless tobacco: Never Used  . Alcohol Use: No  . Drug Use: No  . Sexually Active: No   Other Topics Concern  . Not on file   Social History Narrative   She lives w/her son. Her son offers help and she has an aid who occasionally cooks for her and takes care of some other tasks around the pt's house.   Has medicare. Has son in [place] who is POA   Ambivalent about DNR issues and does not want a STOP sign posted in home          Review of Systems: Pertinent items are noted in HPI.  Physical Exam: Blood pressure 148/51, pulse 70, temperature 99.3 F (37.4 C), temperature source Oral, resp. rate 18, height 5\' 2"  (1.575 m), weight 275 lb (124.739 kg), last menstrual period 05/22/1968, SpO2 96.00%. Vitals reviewed. General: resting in bed, in NAD, with intermitted cough HEENT: no scleral icterus Cardiac: irregularly irregular rate, distant heart sounds secondary to body habitus, no murmur appreciated Pulm: soft inspiratory wheezes at the lung bases, poor respiratory effort, no respiratory distress Abd: obese, soft, nontender, nondistended, hypoactive BS present Ext: warm and well perfused, 1+ pitting edema bilaterally up to her knees Neuro: alert and oriented X3, cranial nerves II-XII grossly intact, strength and sensation to light touch equal in bilateral upper and lower extremities    Lab results: Basic Metabolic Panel:  Recent Labs  16/10/96 0444  NA 134*  K 4.3  CL 96  CO2 28  GLUCOSE 187*  BUN 16  CREATININE 0.91  CALCIUM 9.8  MG 2.0   Liver Function Tests:  Recent Labs  05/23/12 0444  AST 20  ALT 29  ALKPHOS 136*  BILITOT 1.0  PROT 7.1  ALBUMIN 3.1*   CBC:  Recent Labs  05/23/12 0444  WBC 14.5*  HGB 12.8  HCT 37.8  MCV 89.8  PLT 284   BNP:  Recent Labs  05/23/12 0444  PROBNP  3018.0*   Coagulation:  Recent Labs  05/23/12 0444  LABPROT 25.3*  INR 2.43*    Imaging results:  Dg Chest 2 View  05/23/2012  *RADIOLOGY REPORT*  Clinical Data: Shortness of breath.  CHEST - 2 VIEW  Comparison: Chest x-ray 05/12/2012.  Findings: Lung volumes are low.  Bibasilar opacities (left greater than right) favored to represent predominately subsegmental atelectasis, although left lower lobe consolidation is difficult to exclude.  Probable small left pleural effusion. There is cephalization of the pulmonary vasculature and slight indistinctness of the interstitial markings suggestive of mild pulmonary edema.  Mild cardiomegaly. The patient is rotated to the left on today's exam, resulting in distortion of the mediastinal contours and reduced diagnostic sensitivity and specificity for mediastinal pathology.  Atherosclerosis in the thoracic aorta.  IMPRESSION: 1.  Findings, as above, suggestive of mild congestive heart failure. 2.  Low lung volumes with worsening aeration at the left base, concerning for atelectasis and/or consolidation.  There is also likely a small left pleural effusion and subsegmental atelectasis in the right base. 3.  Atherosclerosis.   Original Report Authenticated By: Trudie Reed, M.D.     Other results: EKG: A. fib  Assessment & Plan by Problem:  Acute on chronic diastolic heart failure. She has increased shortness of breath and 1+ pitting  edema bilaterally up to her knees. Her weight is up by 5 lbs, she has pro-BNP elevated to 3018 from 1440 on 3/17. Her CXR is remarkable for mild congestive heart failure. She denies dietary indiscretions and assures compliance with her Lasix 40mg  daily. She does have moderate to severe aortic stenosis and likely has low tolerance for even mild hypervolemia. The goal of her therapy will be in symptomatic treatment and well as further education on HF management at home, encouraging her to weight herself daily and to adjust her  Lasix dose as needed.  -Admit to telemetry -Lasix IV 40mg  BID (she has received 80mg  IV in the ED already for a total of 160mg  today) -Continue ASA, Losartan, Norvasc.  -No BB given history of bradycardia and possible sick sinus syndrome  Possible HCAP. She denies fever at home with cough that is improving with scant white sputum. Her CXR shows low lung volumes with worsening aeration at the left base, concerning for atelectasis and/or consolidation and also likely a small left pleural effusion and subsegmental atelectasis in the right base. She had small infiltrate of left mid lobe during her last hospitalization (CXR on 3/18) and was treated for CAP at that time and discharged with doxycycline which she finished two days ago. She has a leukocytosis of 14.5 but this could be explained by recent steroid use including inhaled corticosteroids. Given her recent hospitalization, however, she is at risk for HCAP and will be empirically treated with antibiotics at this time.  -f/u blood cultures x2 (obtained in ED prior to Abx tx) -Continue Vancomycin, Cefepime, Levaquin (will deescalate if patient remains afebrile) -IS, flutter valve.  -2 view CXR in AM, after further diuresis  Leukocytosis. WBC of 14.5 on presentation, was 6.3 on 3/18. This could be secondary to secondary effect of recent steroid use or could be secondary to infectious process. She denies dysuria but UA is pending. CXR concerning for possible new pneumonia.  -f/u UA and urine culture -f/u CXR in AM -Continue monitoring -Repeat CBC in AM  COPD. Appears stable at this time. She denies increased cough or sputum. She does have increased SOB but this is likely explained by her CHF exacerbation. She is on 2L Ong O2 at home and treatment with albuterol inhaler PRN, Advair, Spiriva, and Singulair.  -Continue albuterol inhaler -Atrovent Nebulizer q6h -Singulair -Dulera -IS/flutter valve  Atrial fibrillation. She has A.fib on presentation.  She is on warfarin with INR of 2.43 at goal. She is followed by Dr. Alexandria Lodge and the [facility] anti-coagulation clinic. She has been seen by Dr. Mayford Knife in Cardiology, was started on Diltiazem fro rate control but this medication was stopped secondary to bradycardia with rate in 50s. The patient has had asymptomatic nocturnal brachycardia since discontinuation of Diltiazem but per Cardiology, no interventions are needed for this problem at this time.  -Continue warfarin per pharmacy -Will check TSH  Hypothyroidism. Last TSH 1.277 on 2/24 but had been 9.464 with medication adjustment on 04/01/12 (synthroid increased from daily to daily).  -Check TSH -Continue synthroid daily  DM2. Well controlled, last HgA1C of 7.0% on 03/31/12. She is on glipizide 10mg  BID ac at home.  -Hold glipizide while inpatient (in case pt needs to be NPO) -SSI ac HS -CBG q4h -Carb mod diet  HTN. BP well controlled on presentation at 136/71. She is on Norvasc, Cozaar, and Lasix at home and we will continues her current regimen with increase in Lasix   Hyperlipidemia. Last LDL of  62 on 08/02/11. She is on Lipitor 40mg  at home which we will continue.   DVT prophylaxis: on warfarin  FEN NSL Replete as needed Carb mod    Dispo: Disposition is deferred at this time, awaiting improvement of current medical problems. Anticipated discharge in approximately 1-2 day(s).   The patient does have a current PCP (BHARDWAJ, SHILPA, MD), therefore will be requiring OPC follow-up after discharge.   The patient does not have transportation limitations that hinder transportation to clinic appointments.  Signed: Ky Barban 05/23/2012, 8:50 AM

## 2012-05-23 NOTE — ED Notes (Signed)
Per EMS report, patient started having increasing shortness of breath this morning.  Patient called son to help and EMS was called.   EMS advised patient lung sounds clear and patient was in no distress.  NAD upon arrival.

## 2012-05-23 NOTE — ED Provider Notes (Signed)
History     CSN: [id number]  Arrival date & time 05/23/12  1191   First MD Initiated Contact with Patient 05/23/12 [phone]      Chief Complaint  Patient presents with  . Shortness of Breath    (Consider location/radiation/quality/duration/timing/severity/associated sxs/prior treatment) HPI  79 yo woman with multiple chronic medical problems - please see PMH for details. She presents via EMS from home with complaint of SOB. Patient awoke from sleep approximately 2 hours ago with SOB. She had been well yesterday and last night before bed. She denies fever, cough, chest pain, LE edema.   Denies history of similar sx. Of note, her dose of lasix was changed from 80mg  qd to 60mg  qd on 3/17 by her PCP, Dr. Dalphine Handing.  The patient has a history of PE for which she is anticoagulated with Coumadin and her INR was 1.4.  Patient denies any recent changes to Coumadin dose.    Past Medical History  Diagnosis Date  . CAD (coronary artery disease)     s/p remote MI (1993)  . Abdominal pain     recent admission, likely secondary to presbyesophagus and gastric dysmotility  . CVD (cardiovascular disease)   . Chronic diastolic heart failure     with dilated cardiomyopathy, last EF 55%(09/2006)  . Respiratory failure, chronic     mixed etiology with bronchospastic component  . Urinary incontinence     recurrent uti's/resistance to cipro, bactrim  . Gout   . Postablative hypothyroidism     H/o Graves disease s/p radioactive iodine ablation with resultant postablative hypothyroidism  . Hyperlipidemia   . Depression   . Morbidly obese     s/p gastric plication surgery  . Gastroesophageal reflux disease   . Ventral hernia     repair in April 2008, complicated by MRSA abdominal wall cellulitis  . DVT (deep venous thrombosis) 1998  . Pulmonary embolism 1998    1998, s/p Greenfield filter  . Diabetes mellitus type 2, controlled, with complications   . Hypertension   . History of recurrent UTIs    and history of pyelonephritis  . AF (atrial fibrillation)     on chronic warfarin  . Osteoarthritis   . Myocardial infarction   . Stroke 1997    denies residual  . Anemia     Blood transfusion [V58.2]  . Supratherapeutic INR 05/15/2012    Possibly induced by recent steroid use.     Past Surgical History  Procedure Laterality Date  . Cholecystectomy    . Appendectomy    . Breast biopsy    . Gastric bypass  1970's  . Hernia repair  05/2006    ventral hernia  . Greenfield filter placement  1998    Family History  Problem Relation Age of Onset  . Colon cancer Neg Hx     History  Substance Use Topics  . Smoking status: Former Smoker -- 1.00 packs/day for 1.5 years    Types: Cigarettes  . Smokeless tobacco: Never Used  . Alcohol Use: No    OB History   Grav Para Term Preterm Abortions TAB SAB Ect Mult Living                  Review of Systems Gen: no weight loss, fevers, chills, night sweats Eyes: no discharge or drainage, no occular pain or visual changes Nose: no epistaxis or rhinorrhea Mouth: no dental pain, no sore throat Neck: no neck pain Lungs: As per history of present illness, otherwise  negative CV: As per history of present illness, otherwise negative Abd: no abdominal pain, nausea, vomiting GU: no dysuria or gross hematuria MSK: no myalgias or arthralgias Neuro: no headache, no focal neurologic deficits Skin: no rash Psyche: negative.  Allergies  Lantus and Nitrofurantoin  Home Medications   Current Outpatient Rx  Name  Route  Sig  Dispense  Refill  . acetaminophen (TYLENOL) 500 MG tablet   Oral   Take 1,000 mg by mouth 2 (two) times daily as needed. For pain.         Marland Kitchen albuterol (PROVENTIL HFA) 108 (90 BASE) MCG/ACT inhaler   Inhalation   Inhale 2 puffs into the lungs every 6 (six) hours as needed. For wheezing.   3.7 g   11   . allopurinol (ZYLOPRIM) 100 MG tablet   Oral   Take 100 mg by mouth 2 (two) times daily.         Marland Kitchen  amLODipine (NORVASC) 5 MG tablet   Oral   Take 5 mg by mouth daily.         Marland Kitchen aspirin EC 81 MG EC tablet   Oral   Take 1 tablet (81 mg total) by mouth daily.         Marland Kitchen atorvastatin (LIPITOR) 40 MG tablet   Oral   Take 40 mg by mouth daily.         Marland Kitchen doxycycline (VIBRA-TABS) 100 MG tablet   Oral   Take 1 tablet (100 mg total) by mouth 2 (two) times daily.   7 tablet   0   . FLUoxetine (PROZAC) 20 MG capsule   Oral   Take 20 mg by mouth 2 (two) times daily.         . Fluticasone-Salmeterol (ADVAIR) 250-50 MCG/DOSE AEPB   Inhalation   Inhale 1 puff into the lungs every 12 (twelve) hours.   60 each   3   . furosemide (LASIX) 20 MG tablet   Oral   Take 2 tablets (40 mg total) by mouth 2 (two) times daily.   120 tablet   1   . gabapentin (NEURONTIN) 100 MG capsule   Oral   Take 100 mg by mouth 2 (two) times daily.         Marland Kitchen glipiZIDE (GLUCOTROL) 10 MG tablet   Oral   Take 10 mg by mouth 2 (two) times daily before a meal.         . levothyroxine (SYNTHROID) 150 MCG tablet   Oral   Take 1 tablet (150 mcg total) by mouth daily.   30 tablet   11   . loperamide (IMODIUM) 2 MG capsule   Oral   Take 1 capsule (2 mg total) by mouth as needed for diarrhea or loose stools (up to total 16mg  daily).   8 capsule   0   . losartan (COZAAR) 50 MG tablet   Oral   Take 50 mg by mouth daily.         . montelukast (SINGULAIR) 10 MG tablet   Oral   Take 10 mg by mouth at bedtime.         Marland Kitchen omeprazole (PRILOSEC) 20 MG capsule   Oral   Take 40 mg by mouth 2 (two) times daily.         Marland Kitchen tiotropium (SPIRIVA HANDIHALER) 18 MCG inhalation capsule   Inhalation   Place 1 capsule (18 mcg total) into inhaler and inhale daily.   30 capsule  4   . warfarin (COUMADIN) 5 MG tablet   Oral   Take 5 mg by mouth daily.           BP 134/72  Pulse 67  Temp(Src) 98.5 F (36.9 C) (Oral)  Resp 18  Ht 5\' 2"  (1.575 m)  Wt 275 lb (124.739 kg)  BMI 50.29 kg/m2   SpO2 94%  LMP 05/22/1968  Physical Exam Gen: well developed and well nourished appearing Head: NCAT Eyes: PERL, EOMI Nose: no epistaixis or rhinorrhea Mouth/throat: mucosa is moist and pink Neck: supple, no stridor Lungs: bibasilar rales, RR 24/min Heart: RRR, no murmur, extremities well perfused.  Abd: soft, notender, nondistended Back: no ttp, no cva ttp Skin: no rashese, wnl Neuro: CN ii-xii grossly intact, no focal deficits Psyche; normal affect,  calm and cooperative.   ED Course  Procedures (including critical care time)  EKG: nsr, no acute ischemic changes, normal intervals, normal axis, normal qrs complex  See EHR for labs. Labs notable for elevated BUN and Cr consistent with AKI, hyperglycemia, therapeutic INR  Case discussed with Hospitalist who has agreed to admit for acutely decompensated heart failure and acute kidney injury.         MDM  See above        Brandt Loosen, MD 06/16/12 [phone]

## 2012-05-24 ENCOUNTER — Inpatient Hospital Stay (HOSPITAL_COMMUNITY): Payer: Medicare PPO

## 2012-05-24 ENCOUNTER — Encounter (HOSPITAL_COMMUNITY): Payer: Self-pay | Admitting: Cardiology

## 2012-05-24 DIAGNOSIS — I5033 Acute on chronic diastolic (congestive) heart failure: Principal | ICD-10-CM

## 2012-05-24 DIAGNOSIS — I498 Other specified cardiac arrhythmias: Secondary | ICD-10-CM

## 2012-05-24 DIAGNOSIS — I2510 Atherosclerotic heart disease of native coronary artery without angina pectoris: Secondary | ICD-10-CM

## 2012-05-24 DIAGNOSIS — I4891 Unspecified atrial fibrillation: Secondary | ICD-10-CM

## 2012-05-24 DIAGNOSIS — I509 Heart failure, unspecified: Secondary | ICD-10-CM

## 2012-05-24 LAB — GLUCOSE, CAPILLARY
Glucose-Capillary: 196 mg/dL — ABNORMAL HIGH (ref 70–99)
Glucose-Capillary: 156 mg/dL — ABNORMAL HIGH (ref 70–99)
Glucose-Capillary: 185 mg/dL — ABNORMAL HIGH (ref 70–99)

## 2012-05-24 LAB — PROTIME-INR: INR: 2.48 — ABNORMAL HIGH (ref 0.00–1.49)

## 2012-05-24 LAB — BASIC METABOLIC PANEL
BUN: 23 mg/dL (ref 6–23)
CO2: 32 mEq/L (ref 19–32)
Chloride: 95 mEq/L — ABNORMAL LOW (ref 96–112)
Creatinine, Ser: 1.15 mg/dL — ABNORMAL HIGH (ref 0.50–1.10)
GFR calc Af Amer: 51 mL/min — ABNORMAL LOW (ref >90)
Glucose, Bld: 146 mg/dL — ABNORMAL HIGH (ref 70–99)
Potassium: 3.9 mEq/L (ref 3.5–5.1)

## 2012-05-24 LAB — TROPONIN I: Troponin I: <0.3 ng/mL (ref <0.30)

## 2012-05-24 LAB — CBC
HCT: 37 % (ref 36.0–46.0)
Hemoglobin: 11.8 g/dL — ABNORMAL LOW (ref 12.0–15.0)
MCHC: 31.9 g/dL (ref 30.0–36.0)
MCV: 90.9 fL (ref 78.0–100.0)
RDW: 14.4 % (ref 11.5–15.5)

## 2012-05-24 MED ORDER — POLYETHYLENE GLYCOL 3350 17 G PO PACK
17.0000 g | PACK | Freq: Every day | ORAL | Status: DC
  Administered 2012-05-24 – 2012-05-27 (×4): 17 g via ORAL
  Filled 2012-05-24 (×4): qty 1

## 2012-05-24 MED ORDER — IPRATROPIUM BROMIDE 0.02 % IN SOLN
0.5000 mg | Freq: Two times a day (BID) | RESPIRATORY_TRACT | Status: DC
  Administered 2012-05-24 – 2012-05-26 (×4): 0.5 mg via RESPIRATORY_TRACT
  Filled 2012-05-24 (×4): qty 2.5

## 2012-05-24 MED ORDER — FUROSEMIDE 10 MG/ML IJ SOLN
40.0000 mg | Freq: Every day | INTRAMUSCULAR | Status: DC
  Filled 2012-05-24: qty 4

## 2012-05-24 NOTE — Progress Notes (Addendum)
Subjective:  -Patient feels better. Her SOB improved. She reports having mild cough, but no sputum production. No fever or chill. No chest pain. -Patient has good urine output yesterday. The recorded urine output is 250 ml which is not accurate due to her incontinence. Per nurse, patient urinated with large volumes of urine overnight though not recorded.  Related test and lab results: 1. Cre up from nl to 1.15  2. 2D Echo on 04/21/12: EF 55%. Left ventricular diastolic function parameters were normal. Moderate aortic stenosis. Valve area: 0.57cm^2(VTI). Valve area: 0.51cm^2 (Vmax).  3. The repeated CXR: Improving changes of congestive heart failure.   Objective: Vital signs in last 24 hours: Filed Vitals:   05/23/12 2055 05/24/12 0200 05/24/12 0431 05/24/12 0902  BP:   137/63   Pulse:   47   Temp:      TempSrc:      Resp:   18   Height:      Weight:   272 lb 14.9 oz (123.8 kg)   SpO2: 94% 93% 97% 97%   Weight change: 2 lb 1.9 oz (0.961 kg)  Intake/Output Summary (Last 24 hours) at 05/24/12 1132 Last data filed at 05/23/12 2111  Gross per 24 hour  Intake    643 ml  Output    250 ml  Net    393 ml   General: resting in bed, in NAD, with intermitted cough HEENT: no scleral icterus Cardiac: irregularly irregular rate, distant heart sounds, no murmur appreciated Pulm: wheezes at the lung bases, inproved respiratory effort compared to yesterday Abd: obese, soft, nontender, nondistended, hypoactive BS present Ext: warm and well perfused, 1+ pitting edema bilaterally up to her knees Neuro: alert and oriented X3, cranial nerves II-XII grossly intact, strength and sensation to light touch equal in bilateral upper and lower extremities  Lab Results: Basic Metabolic Panel:  Recent Labs Lab 05/23/12 0444 05/24/12 0900  NA 134* 136  K 4.3 3.9  CL 96 95*  CO2 28 32  GLUCOSE 187* 146*  BUN 16 23  CREATININE 0.91 1.15*  CALCIUM 9.8 9.6  MG 2.0  --    Liver Function  Tests:  Recent Labs Lab 05/23/12 0444  AST 20  ALT 29  ALKPHOS 136*  BILITOT 1.0  PROT 7.1  ALBUMIN 3.1*   No results found for this basename: LIPASE, AMYLASE,  in the last 168 hours No results found for this basename: AMMONIA,  in the last 168 hours CBC:  Recent Labs Lab 05/23/12 0444 05/24/12 0900  WBC 14.5* 8.4  HGB 12.8 11.8*  HCT 37.8 37.0  MCV 89.8 90.9  PLT 284 224   Cardiac Enzymes: No results found for this basename: CKTOTAL, CKMB, CKMBINDEX, TROPONINI,  in the last 168 hours BNP:  Recent Labs Lab 05/23/12 0444  PROBNP 3018.0*   D-Dimer: No results found for this basename: DDIMER,  in the last 168 hours CBG:  Recent Labs Lab 05/23/12 1138 05/23/12 1643 05/23/12 1656 05/23/12 2121 05/24/12 0550  GLUCAP 184* 75 130* 196* 156*   Hemoglobin A1C: No results found for this basename: HGBA1C,  in the last 168 hours Fasting Lipid Panel: No results found for this basename: CHOL, HDL, LDLCALC, TRIG, CHOLHDL, LDLDIRECT,  in the last 168 hours Thyroid Function Tests:  Recent Labs Lab 05/23/12 1008  TSH 5.588*   Coagulation:  Recent Labs Lab 05/18/12 1217 05/23/12 0444 05/24/12 0900  LABPROT  --  25.3* 25.7*  INR 2.20 2.43* 2.48*   Anemia Panel:  No results found for this basename: VITAMINB12, FOLATE, FERRITIN, TIBC, IRON, RETICCTPCT,  in the last 168 hours Urine Drug Screen: Drugs of Abuse  No results found for this basename: labopia, cocainscrnur, labbenz, amphetmu, thcu, labbarb    Alcohol Level: No results found for this basename: ETH,  in the last 168 hours Urinalysis: No results found for this basename: COLORURINE, APPERANCEUR, LABSPEC, PHURINE, GLUCOSEU, HGBUR, BILIRUBINUR, KETONESUR, PROTEINUR, UROBILINOGEN, NITRITE, LEUKOCYTESUR,  in the last 168 hours Misc. Labs:   Micro Results: No results found for this or any previous visit (from the past 240 hour(s)). Studies/Results: Dg Chest 2 View  05/24/2012  *RADIOLOGY REPORT*   Clinical Data: Cough.  Weakness.  CHEST - 2 VIEW  Comparison: 05/23/2012.  Findings: Improved inspiration.  Stable enlarged cardiac silhouette.  Decreased prominence of the pulmonary vasculature and interstitial markings.  Minimal bilateral linear density. Unremarkable bones.  Inferior vena cava filter and upper abdominal surgical clips.  IMPRESSION:  1.  Improving changes of congestive heart failure. 2.  Minimal bilateral linear atelectasis. 3.  Stable cardiomegaly.   Original Report Authenticated By: Beckie Salts, M.D.    Dg Chest 2 View  05/23/2012  *RADIOLOGY REPORT*  Clinical Data: Shortness of breath.  CHEST - 2 VIEW  Comparison: Chest x-ray 05/12/2012.  Findings: Lung volumes are low.  Bibasilar opacities (left greater than right) favored to represent predominately subsegmental atelectasis, although left lower lobe consolidation is difficult to exclude.  Probable small left pleural effusion. There is cephalization of the pulmonary vasculature and slight indistinctness of the interstitial markings suggestive of mild pulmonary edema.  Mild cardiomegaly. The patient is rotated to the left on today's exam, resulting in distortion of the mediastinal contours and reduced diagnostic sensitivity and specificity for mediastinal pathology.  Atherosclerosis in the thoracic aorta.  IMPRESSION: 1.  Findings, as above, suggestive of mild congestive heart failure. 2.  Low lung volumes with worsening aeration at the left base, concerning for atelectasis and/or consolidation.  There is also likely a small left pleural effusion and subsegmental atelectasis in the right base. 3.  Atherosclerosis.   Original Report Authenticated By: Trudie Reed, M.D.    Medications:  Scheduled Meds: . allopurinol  100 mg Oral BID  . amLODipine  5 mg Oral Daily  . aspirin EC  81 mg Oral Daily  . atorvastatin  40 mg Oral q1800  . FLUoxetine  20 mg Oral BID  . furosemide  40 mg Intravenous BID  . gabapentin  100 mg Oral BID  .  insulin aspart  0-5 Units Subcutaneous QHS  . insulin aspart  0-9 Units Subcutaneous TID WC  . ipratropium  0.5 mg Nebulization BID  . levothyroxine  150 mcg Oral QAC breakfast  . losartan  50 mg Oral Daily  . mometasone-formoterol  2 puff Inhalation BID  . montelukast  10 mg Oral QHS  . pantoprazole  40 mg Oral Daily  . sodium chloride  3 mL Intravenous Q12H  . sodium chloride  3 mL Intravenous Q12H  . warfarin  5 mg Oral q1800  . Warfarin - Physician Dosing Inpatient   Does not apply q1800   Continuous Infusions:  PRN Meds:.sodium chloride, acetaminophen, albuterol, loperamide, sodium chloride Assessment/Plan:  Addendum:  Positive blood culture: One of two blood draw (left arm) positivve for G+ cocci in cluster. I called lab and was told that coagulase will not be available until tomorrow. Patient has no fever or leukocytosis. It is likely contamination. Will not treat at this moment.  if develops fever, will need to get blood culture and start IV Vanco, Cefepeme and Levaquin.   Acute on chronic diastolic heart failure. She has increased shortness of breath and 1+ pitting edema bilaterally up to her knees on admission. Her weight is up by 5 lbs, she has pro-BNP elevated to 3018 from 1440 on 3/17. Her CXR is remarkable for mild congestive heart failure. She denies dietary indiscretions and assures compliance with her Lasix 40mg  daily. She does have moderate to severe aortic stenosis and likely has low tolerance for even mild hypervolemia. The goal of her therapy will be in symptomatic treatment and well as further education on HF management at home, encouraging her to weight herself daily and to adjust her Lasix dose as needed. Patient received 40 mg X 3=120 mg lasix on yesterday. Patient had good urine output with improved SOB.  Given her severe aortic stenosis, will need cardiology's input. Her Cre is up from 0.91 to 1.15.  -will decrease Lasix from IV 40mg  BID to daily due to cre  elevation. -Continue ASA, Losartan, Norvasc. If Cre continue to increase, will d/c Losartan -No BB given history of bradycardia and possible sick sinus syndrome  Possible HCAP. She denies fever at home with cough that is improving with scant white sputum. Her CXR shows low lung volumes with worsening aeration at the left base, concerning for atelectasis and/or consolidation and also likely a small left pleural effusion and subsegmental atelectasis in the right base. She had small infiltrate of left mid lobe during her last hospitalization (CXR on 3/18) and was treated for CAP at that time and discharged with doxycycline which she finished two days ago. She had leukocytosis of 14.5 but this could be explained by recent steroid use including inhaled corticosteroids. She received one dose of  Vancomycin and Levaquin in ED on 2/29/14. Given her recent hospitalization, she is at risk for HCAP, however, today, her SOB improved after good diuresis. the repeated CXR showed improving changes of congestive heart failure without obvious infiltration. Her leukocytosis resolved. She dose not have chest pain. It is very unlikely that patient has HCAP or PNA.  -will monitor closely -will not continue antibiotics   Leukocytosis. Resolved.  WBC of 14.5 on presentation, was 6.3 on 3/18. This could be secondary to secondary effect of recent steroid use or could be secondary to infectious process.   COPD. Appears stable at this time. She denies increased cough or sputum. She does have increased SOB but this is likely explained by her CHF exacerbation. She is on 2L [place] O2 at home and treatment with albuterol inhaler PRN, Advair, Spiriva, and Singulair.    -Continue albuterol inhaler -Atrovent Nebulizer q6h -Singulair -Dulera -IS/flutter valve  Atrial fibrillation. She has A.fib on presentation. She is on warfarin with INR of 2.43 at goal. She is followed by Dr. Alexandria Lodge and the [facility] anti-coagulation clinic. She has been  seen by Dr. Mayford Knife in Cardiology, was started on Diltiazem fro rate control but this medication was stopped secondary to bradycardia with rate in 50s. The patient has had asymptomatic nocturnal brachycardia since discontinuation of Diltiazem but per Cardiology, no interventions are needed for this problem at this time.   -Continue warfarin per pharmacy  Hypothyroidism. Last TSH 1.277 on 2/24 but had been 9.464 with medication adjustment on 04/01/12 (synthroid increased from daily to daily). The  repeated TSH is 5.588.   -Continue synthroid daily -may repeat TSH after acute issue resolves.  DM2. Well  controlled, last HgA1C of 7.0% on 03/31/12. She is on glipizide 10mg  BID ac at home.   -Hold glipizide while inpatient (in case pt needs to be NPO) -SSI ac HS -CBG q4h -Carb mod diet  HTN. BP well controlled on presentation at 136/71. She is on Norvasc, Cozaar, and Lasix at home and we will continues her current regimen in addition to IV Lasix.  Hyperlipidemia. Last LDL of 62 on 08/02/11. She is on Lipitor 40mg  at home which we will continue.   DVT prophylaxis: on warfarin. INR 2.84 today.    LOS: 1 day   Lorretta Harp 05/24/2012, 11:32 AM

## 2012-05-24 NOTE — Consult Note (Signed)
Primary cardiologist: Dr. Armanda Magic Deboraha Sprang)  Clinical Summary [name] is a medically complex 79 y.o.female presently admitted with increasing shortness of breath over the last several weeks. She is being managed for acute on chronic diastolic heart failure, has been on intravenous diuretic with improvement in symptoms, although not back to baseline. She has also had a cough, initially with questionable left basilar infiltrate, being treated empirically with antibiotics. We are asked to assist with her management.  She states that she is to follow up with Dr. Mayford Knife with [facility] within the next few weeks. Cardiac history includes aortic stenosis, at least moderate based on recent echocardiogram, overall LVEF 55%. She has atrial fibrillation on Coumadin. Also component of sick sinus syndrome, presently on no rate lowering agents. Cardiac catheterization from 2006 demonstrated only mild atherosclerosis.  She reports compliance with her medications. No exertional chest pain or palpitations at baseline. She is describing NYHA class III symptoms.   Allergies  Allergen Reactions  . Lantus (Insulin Glargine)     Causes a lot of itching  . Nitrofurantoin (Macrodantin) Itching    Medications Scheduled Medications: . allopurinol  100 mg Oral BID  . amLODipine  5 mg Oral Daily  . aspirin EC  81 mg Oral Daily  . atorvastatin  40 mg Oral q1800  . FLUoxetine  20 mg Oral BID  . [START ON 05/25/2012] furosemide  40 mg Intravenous Daily  . gabapentin  100 mg Oral BID  . insulin aspart  0-5 Units Subcutaneous QHS  . insulin aspart  0-9 Units Subcutaneous TID WC  . ipratropium  0.5 mg Nebulization BID  . levothyroxine  150 mcg Oral QAC breakfast  . losartan  50 mg Oral Daily  . mometasone-formoterol  2 puff Inhalation BID  . montelukast  10 mg Oral QHS  . pantoprazole  40 mg Oral Daily  . sodium chloride  3 mL Intravenous Q12H  . sodium chloride  3 mL Intravenous Q12H  . warfarin   5 mg Oral q1800  . Warfarin - Physician Dosing Inpatient   Does not apply q1800      PRN Medications:  sodium chloride, acetaminophen, albuterol, loperamide, sodium chloride  Past Medical History  Diagnosis Date  . Coronary atherosclerosis of native coronary artery     Mild at cateterization 2006  . Abdominal pain     Likely secondary to presbyesophagus and gastric dysmotility  . Chronic diastolic heart failure   . Respiratory failure, chronic     Mixed etiology with bronchospastic component  . Urinary incontinence     Recurrent uti's/resistance to cipro, bactrim  . Gout   . Postablative hypothyroidism     H/o Graves disease s/p radioactive iodine ablation with resultant postablative hypothyroidism  . Hyperlipidemia   . Depression   . Morbidly obese     s/p gastric plication surgery  . Gastroesophageal reflux disease   . Ventral hernia     Repair in April 2008, complicated by MRSA abdominal wall cellulitis  . DVT (deep venous thrombosis) 1998  . Pulmonary embolism 1998    Greenfield filter  . Diabetes mellitus type 2, controlled, with complications   . Essential hypertension, benign   . AF (atrial fibrillation)     on chronic warfarin  . Osteoarthritis   . Stroke 1997    Denies residual  . Anemia     Blood transfusion [V58.2]  . Supratherapeutic INR 05/15/2012    Possibly induced by recent steroid use.  Past Surgical History  Procedure Laterality Date  . Cholecystectomy    . Appendectomy    . Breast biopsy    . Gastric bypass  1970's  . Hernia repair  05/2006    ventral hernia  . Greenfield filter placement  1998    Family History  Problem Relation Age of Onset  . Colon cancer Neg Hx     Social History [name] reports that she has quit smoking. Her smoking use included Cigarettes. She has a 1.5 pack-year smoking history. She has never used smokeless tobacco. [name] reports that she does not drink alcohol.  Review of Systems No fevers or  chills. Had no hemoptysis. Mild orthopnea. Stable appetite. Stable bowel patterns. Otherwise negative except as outlined.  Physical Examination Blood pressure 137/63, pulse 47, temperature 98.1 F (36.7 C), temperature source Oral, resp. rate 18, height 5\' 2"  (1.575 m), weight 272 lb 14.9 oz (123.8 kg), last menstrual period 05/22/1968, SpO2 97.00%.  Intake/Output Summary (Last 24 hours) at 05/24/12 1342 Last data filed at 05/23/12 2111  Gross per 24 hour  Intake    643 ml  Output    250 ml  Net    393 ml   Obese woman in no acute distress. HEENT: Conjunctiva and lids normal, oropharynx clear. Neck: Supple, difficult to access JVP with increased girth, no carotid bruits, no thyromegaly. Lungs: Diminished but clear to auscultation, nonlabored breathing at rest. Cardiac: Irregularly irregular rhythm, no S3, 2-3/6 systolic murmur, no pericardial rub. Abdomen: Soft, nontender, bowel sounds present, no guarding or rebound. Extremities: Trace edema, distal pulses 1-2+. Skin: Warm and dry. Musculoskeletal: No kyphosis. Neuropsychiatric: Alert and oriented x3, affect grossly appropriate.  Lab Results Basic Metabolic Panel:  Recent Labs Lab 05/18/12 1354 05/19/12 1151 05/23/12 0444 05/24/12 0900  NA 137 138 134* 136  K 5.5* 4.5 4.3 3.9  CL 95* 97 96 95*  CO2 27 30 28  32  GLUCOSE 260* 335* 187* 146*  BUN 29* 25* 16 23  CREATININE 1.19* 1.12* 0.91 1.15*  CALCIUM 10.2 9.8 9.8 9.6  MG  --   --  2.0  --    Liver Function Tests:  Recent Labs Lab 05/23/12 0444  AST 20  ALT 29  ALKPHOS 136*  BILITOT 1.0  PROT 7.1  ALBUMIN 3.1*   CBC:  Recent Labs Lab 05/23/12 0444 05/24/12 0900  WBC 14.5* 8.4  HGB 12.8 11.8*  HCT 37.8 37.0  MCV 89.8 90.9  PLT 284 224    ECG Atrial fibrillation with controlled response, LAFB, poor anterior R wave progression, cannot rule out old anterior infarct pattern. Nonspecific ST changes.  Imaging CXR IMPRESSION:  1. Improving changes of  congestive heart failure. 2. Minimal bilateral linear atelectasis. 3. Stable cardiomegaly.   Impression  1. Apparent acute on chronic diastolic heart failure, likely complicated by chronic atrial fibrillation and also at least moderate aortic valve stenosis. Adequate heart rate control is not a significant issue, and she likely has a component of underlying sick sinus syndrome, currently not on rate lowering agents. She is improving somewhat with intravenous diuresis. Weight is down approximately 3 pounds.  2. Chronic atrial fibrillation, on Coumadin.  3. History of CAD, nonobstructive at catheterization 2006 based on record review. LVEF 55%.  4. Hyperlipidemia, on statin therapy.  5. Type diabetes mellitus.  6. Hypertension.  Recommendations  Reviewed current medications. Presently on aspirin, Norvasc, Lipitor, Lasix 40 mg IV daily, Cozaar, and Coumadin. Agree with not using beta blocker in  light of low resting heart rate in atrial fibrillation. Her aortic stenosis could be contributing to the overall clinical picture more significantly, and this will need to be followed closely, although she is not an optimal surgical candidate. Continue diuresis, followup BMET with mild rise in creatinine. Will place on [facility] rounding service and they will take over her care tomorrow.  Jonelle Sidle, M.D., F.A.C.C.

## 2012-05-24 NOTE — H&P (Addendum)
Internal Medicine Teaching Service Attending Note Date: 05/24/2012  Patient name: [name]  Medical record number: [id number]  Date of birth: 16-Nov-1932  I have read the documentation on this case by Dr. Garald Braver and agree with it with the following additions/observations:   The patient, [name], is a 79 y.o. year old female, with past medical history of severe aortic stenosis, Afib on warfarin therapy, CHF with 3 admissions in the past 6 months, who comes in with the chief  complaint of shortness of breath.  Past medical history, social history and medications have been reviewed.   Review of systems as per HPI and resident note.   Filed Vitals:   05/24/12 0431  BP: 137/63  Pulse: 47  Temp:   Resp: 18   Vitals reviewed.  General: Resting in bed. Breathing appears better.  HEENT: PERRL, EOMI, no scleral icterus. Could nto appreciate JVD.  Heart: Bradycardia, S1S2 RRR, systolic murmur.  Lungs: crackles upto mid lung bilaterally Abdomen: Soft, nontender, nondistended, BS present. Ecchymoses from heparin shots Extremities: Warm, 2+ pitting edema up till knees.  Neuro: Alert and oriented X3, cranial nerves II-XII grossly intact,  strength and sensation to light touch equal in bilateral upper and lower extremities  Labs. EKG and imaging reviewed.   Recent Labs Lab 05/18/12 1354 05/19/12 1151 05/23/12 0444 05/24/12 0900  NA 137 138 134* 136  K 5.5* 4.5 4.3 3.9  CL 95* 97 96 95*  CO2 27 30 28  32  GLUCOSE 260* 335* 187* 146*  BUN 29* 25* 16 23  CREATININE 1.19* 1.12* 0.91 1.15*  CALCIUM 10.2 9.8 9.8 9.6  MG  --   --  2.0  --     Recent Labs Lab 05/18/12 1217 05/23/12 0444 05/24/12 0900  AST  --  20  --   ALT  --  29  --   ALKPHOS  --  136*  --   BILITOT  --  1.0  --   PROT  --  7.1  --   ALBUMIN  --  3.1*  --   INR 2.20 2.43* 2.48*    Recent Labs Lab 05/23/12 0444  PROBNP 3018.0*    Recent Labs Lab 05/18/12 1217 05/23/12 0444  05/24/12 0900  INR 2.20 2.43* 2.48*    Recent Labs Lab 05/23/12 0444 05/24/12 0900  HGB 12.8 11.8*  HCT 37.8 37.0  WBC 14.5* 8.4  PLT 284 224    Assessment and Plan   1. Acute on Chronic Congestive Heart Failure in the setting of severe aortic stenosis, bradycardia. With severe stenosis, even a small overload overwhelms this patient, therefore she has been in and out of the hospital so many times. We will continue diuresis at 40 mg IV BID lasix, she has felt clinically better since yesterday. IOs cannot be done unless we put foley in, because the patient is incontinent, however the patient has had a weight change of 3 lbs since yesterday. Keeping in mind that the patient has been shuttling in and out of the hospital and she does not have a cardiologist on the outside, we will call a cardiology consult this admission and request her to follow up with them as outpatient. Noted that not on beta blocker as outpatient due to bradycardia. Initial EKG did not show any changes pertinent to acute MI. Troponins so far negative.   2. Hypothyroidism: The patient has had an increased TSH value of >5 this admission, which I believe might be uncontrolled hypothyroid  status as opposed to sick euthyroid because the patient has been non-compliant with her synthroid. This could have contributed to her bradycardia and exacerbation of CHF. We will resume the medication.  3. Atrial fibrillation controlled, not on beta blocker, thereaputic on coumadin.   4. Gout - Recent steroid use. On allopurinol and colchicine.  5. Diabetes: On SSI.   6. HCAP: Discontinue antibiotics for now. If any more symptoms, or if infiltrate on repeat CXR, then reassess the presence of pneumonia.   Rest per resident note.   [name] 05/24/2012, 11:27 AM.

## 2012-05-25 ENCOUNTER — Ambulatory Visit: Payer: Medicare PPO

## 2012-05-25 DIAGNOSIS — I350 Nonrheumatic aortic (valve) stenosis: Secondary | ICD-10-CM | POA: Diagnosis present

## 2012-05-25 LAB — BASIC METABOLIC PANEL
BUN: 30 mg/dL — ABNORMAL HIGH (ref 6–23)
Chloride: 97 mEq/L (ref 96–112)
Creatinine, Ser: 1.21 mg/dL — ABNORMAL HIGH (ref 0.50–1.10)
GFR calc non Af Amer: 42 mL/min — ABNORMAL LOW (ref >90)
Glucose, Bld: 155 mg/dL — ABNORMAL HIGH (ref 70–99)
Potassium: 3.9 mEq/L (ref 3.5–5.1)

## 2012-05-25 LAB — PROTIME-INR: Prothrombin Time: 24.8 seconds — ABNORMAL HIGH (ref 11.6–15.2)

## 2012-05-25 LAB — GLUCOSE, CAPILLARY
Glucose-Capillary: 158 mg/dL — ABNORMAL HIGH (ref 70–99)
Glucose-Capillary: 160 mg/dL — ABNORMAL HIGH (ref 70–99)
Glucose-Capillary: 191 mg/dL — ABNORMAL HIGH (ref 70–99)

## 2012-05-25 LAB — T4, FREE: Free T4: 1.25 ng/dL (ref 0.80–1.80)

## 2012-05-25 LAB — PRO B NATRIURETIC PEPTIDE: Pro B Natriuretic peptide (BNP): 824.7 pg/mL — ABNORMAL HIGH (ref 0–450)

## 2012-05-25 MED ORDER — FUROSEMIDE 40 MG PO TABS
40.0000 mg | ORAL_TABLET | Freq: Every day | ORAL | Status: DC
  Administered 2012-05-25: 40 mg via ORAL
  Filled 2012-05-25 (×2): qty 1

## 2012-05-25 NOTE — Progress Notes (Signed)
SUBJECTIVE:  Very sleepy but no complaints  OBJECTIVE:   Vitals:   Filed Vitals:   05/24/12 1522 05/24/12 2124 05/24/12 2148 05/25/12 0545  BP: 122/67  156/69 130/69  Pulse: 51  50 50  Temp: 98 F (36.7 C)  98 F (36.7 C) 97.8 F (36.6 C)  TempSrc: Oral  Oral Oral  Resp: 18  20 18   Height:      Weight:    123 kg (271 lb 2.7 oz)  SpO2: 94% 96% 96% 97%   I&O's:   Intake/Output Summary (Last 24 hours) at 05/25/12 0743 Last data filed at 05/25/12 0008  Gross per 24 hour  Intake   1063 ml  Output      0 ml  Net   1063 ml   TELEMETRY: Reviewed telemetry pt in atrial fibrillation     PHYSICAL EXAM General: Well developed, well nourished, in no acute distress Head: Eyes PERRLA, No xanthomas.   Normal cephalic and atramatic  Lungs:   Decreased BS at bases with wheezing Heart:   HRRR S1 S2 Pulses are 2+ & equal. Abdomen: Bowel sounds are positive, abdomen soft and non-tender without masses  Extremities: No clubbing, cyanosis or edema.  DP +1 Neuro: Alert and oriented X 3. Psych:  Good affect, responds appropriately   LABS: Basic Metabolic Panel:  Recent Labs  45/40/98 0444 05/24/12 0900 05/25/12 0453  NA 134* 136 138  K 4.3 3.9 3.9  CL 96 95* 97  CO2 28 32 34*  GLUCOSE 187* 146* 155*  BUN 16 23 30*  CREATININE 0.91 1.15* 1.21*  CALCIUM 9.8 9.6 9.6  MG 2.0  --   --    Liver Function Tests:  Recent Labs  05/23/12 0444  AST 20  ALT 29  ALKPHOS 136*  BILITOT 1.0  PROT 7.1  ALBUMIN 3.1*   No results found for this basename: LIPASE, AMYLASE,  in the last 72 hours CBC:  Recent Labs  05/23/12 0444 05/24/12 0900  WBC 14.5* 8.4  HGB 12.8 11.8*  HCT 37.8 37.0  MCV 89.8 90.9  PLT 284 224   Cardiac Enzymes:  Recent Labs  05/24/12 1330  TROPONINI <0.30    Recent Labs  05/23/12 1008  TSH 5.588*   Anemia Panel: No results found for this basename: VITAMINB12, FOLATE, FERRITIN, TIBC, IRON, RETICCTPCT,  in the last 72 hours Coag Panel:   Lab  Results  Component Value Date   INR 2.37* 05/25/2012   INR 2.48* 05/24/2012   INR 2.43* 05/23/2012    RADIOLOGY: Dg Chest 2 View  05/24/2012  *RADIOLOGY REPORT*  Clinical Data: Cough.  Weakness.  CHEST - 2 VIEW  Comparison: 05/23/2012.  Findings: Improved inspiration.  Stable enlarged cardiac silhouette.  Decreased prominence of the pulmonary vasculature and interstitial markings.  Minimal bilateral linear density. Unremarkable bones.  Inferior vena cava filter and upper abdominal surgical clips.  IMPRESSION:  1.  Improving changes of congestive heart failure. 2.  Minimal bilateral linear atelectasis. 3.  Stable cardiomegaly.   Original Report Authenticated By: Beckie Salts, M.D.    Dg Chest 2 View  05/23/2012  *RADIOLOGY REPORT*  Clinical Data: Shortness of breath.  CHEST - 2 VIEW  Comparison: Chest x-ray 05/12/2012.  Findings: Lung volumes are low.  Bibasilar opacities (left greater than right) favored to represent predominately subsegmental atelectasis, although left lower lobe consolidation is difficult to exclude.  Probable small left pleural effusion. There is cephalization of the pulmonary vasculature and slight indistinctness of the  interstitial markings suggestive of mild pulmonary edema.  Mild cardiomegaly. The patient is rotated to the left on today's exam, resulting in distortion of the mediastinal contours and reduced diagnostic sensitivity and specificity for mediastinal pathology.  Atherosclerosis in the thoracic aorta.  IMPRESSION: 1.  Findings, as above, suggestive of mild congestive heart failure. 2.  Low lung volumes with worsening aeration at the left base, concerning for atelectasis and/or consolidation.  There is also likely a small left pleural effusion and subsegmental atelectasis in the right base. 3.  Atherosclerosis.   Original Report Authenticated By: Trudie Reed, M.D.    Dg Chest 2 View  05/12/2012  *RADIOLOGY REPORT*  Clinical Data: Shortness of breath  CHEST - 2 VIEW   Comparison: May 11, 2012  Findings:   The degree of inspiration is somewhat shallow. There is a new small area of infiltrate in the left mid lung.  Elsewhere, the lungs are clear.  Heart is mildly enlarged.  Pulmonary vascularity is within normal limits.  There is stable elevation of the right hemidiaphragm.  No adenopathy.  No bone lesions.  IMPRESSION: Small of infiltrate left mid lung.  Stable elevation of the right hemidiaphragm.  Mild cardiac enlargement is stable.   Original Report Authenticated By: Bretta Bang, M.D.    Dg Chest 2 View  05/11/2012  *RADIOLOGY REPORT*  Clinical Data: 79 year old female shortness of breath atrial fibrillation.  CHEST - 2 VIEW  Comparison: 04/20/2012 and earlier.  Findings: AP and lateral views of the chest.  Stable low lung volumes. Stable cardiomegaly and mediastinal contours.  No pneumothorax, pulmonary edema, pleural effusion or acute pulmonary opacity. Stable visualized osseous structures.  Numerous upper abdominal surgical clips.  IMPRESSION: No acute cardiopulmonary abnormality.   Original Report Authenticated By: Erskine Speed, M.D.       ASSESSMENT:  1.  Acute on chronic diastolic CHF - I&O's not accurate due to incontinence 2.  Nonobstructive ASCAD at cath 2006 3.  Grave's disease with postablative hypothyroidism 4.  Chronic atrial fibrillation on chronic systemic anticoagulation 5.  HTN 6.  Moderate AS by echo 7.  Acute renal insufficiency secondary to diuresis  PLAN:   1.  Continue IV diuretics 2.  Watch renal function closely 3.  Recheck BNP 4.  Family requesting social worker to discuss SNF placement  Quintella Reichert, MD  05/25/2012  7:43 AM

## 2012-05-25 NOTE — Evaluation (Signed)
Physical Therapy Evaluation Patient Details Name: [name] MRN: [id number] DOB: [birth year] Today's Date: 05/25/2012 Time: 1015-1050 PT Time Calculation (min): 35 min  PT Assessment / Plan / Recommendation Clinical Impression  Pt readm with CHF.  Needs skilled PT to maximize I and safety to decr burden of care and improve quality of life.  Pt has consistently refused ST-SNF.  Believe pt could benefit from ST-SNF if she would agree to help make successful transition to home.  If pt once again refuses this would restart HHPT.    PT Assessment  Patient needs continued PT services    Follow Up Recommendations  SNF    Does the patient have the potential to tolerate intense rehabilitation      Barriers to Discharge Decreased caregiver support      Equipment Recommendations  None recommended by PT    Recommendations for Other Services     Frequency Min 3X/week    Precautions / Restrictions Precautions Precautions: Fall Restrictions Weight Bearing Restrictions: No   Pertinent Vitals/Pain VSS      Mobility  Bed Mobility Bed Mobility: Sitting - Scoot to Edge of Bed Supine to Sit: 4: Min assist;HOB elevated;With rails Sitting - Scoot to Edge of Bed: 5: Supervision Details for Bed Mobility Assistance: Heavy use of rail. Transfers Sit to Stand: 4: Min guard;With upper extremity assist;From bed Stand to Sit: 4: Min guard;With upper extremity assist;With armrests;To chair/3-in-1 Ambulation/Gait Ambulation/Gait Assistance: 4: Min guard Ambulation Distance (Feet): 30 Feet Assistive device: Rolling walker Gait Pattern: Step-through pattern;Decreased stride length;Trunk flexed Gait velocity: decreased    Exercises     PT Diagnosis: Difficulty walking;Generalized weakness  PT Problem List: Decreased strength;Decreased activity tolerance;Decreased balance;Decreased mobility;Obesity PT Treatment Interventions: DME instruction;Gait training;Patient/family education;Functional  mobility training;Therapeutic activities;Therapeutic exercise;Balance training   PT Goals Acute Rehab PT Goals PT Goal Formulation: With patient Time For Goal Achievement: 06/01/12 Potential to Achieve Goals: Good Pt will go Supine/Side to Sit: with modified independence PT Goal: Supine/Side to Sit - Progress: Goal set today Pt will go Sit to Supine/Side: with modified independence PT Goal: Sit to Supine/Side - Progress: Goal set today Pt will go Sit to Stand: with modified independence PT Goal: Sit to Stand - Progress: Goal set today Pt will go Stand to Sit: with modified independence PT Goal: Stand to Sit - Progress: Goal set today Pt will Ambulate: 51 - 150 feet;with modified independence;with least restrictive assistive device PT Goal: Ambulate - Progress: Goal set today  Visit Information  Last PT Received On: 05/25/12 Assistance Needed: +1    Subjective Data  Subjective: Pt states she might have surgery. (?) Patient Stated Goal: Return home   Prior Functioning  Home Living Lives With: Son Available Help at Discharge: Family;Available PRN/intermittently;Personal care attendant Type of Home: House Home Access: Stairs to enter [employer] of Steps: 2 Home Layout: One level Bathroom Shower/Tub: Engineer, manufacturing systems: Standard Home Adaptive Equipment: Walker - rolling;Shower chair with back Prior Function Level of Independence: Needs assistance Needs Assistance: Bathing;Light Housekeeping;Meal Prep Bath: Minimal Meal Prep: Moderate Light Housekeeping: Moderate Able to Take Stairs?: Yes Driving: No Vocation: On disability Comments: Amb modified independent with walker, aide is there 6 days/wk 9-1pm. Communication Communication: No difficulties    Cognition  Cognition Overall Cognitive Status: Appears within functional limits for tasks assessed/performed Arousal/Alertness: Awake/alert Orientation Level: Appears intact for tasks  assessed Behavior During Session: [facility] for tasks performed    Extremity/Trunk Assessment Right Lower Extremity Assessment RLE ROM/Strength/Tone: Deficits  RLE ROM/Strength/Tone Deficits: generally weak, grossly 4/5 RLE Sensation: History of peripheral neuropathy Left Lower Extremity Assessment LLE ROM/Strength/Tone: Deficits LLE ROM/Strength/Tone Deficits: generally weak, grossly 4/5 LLE Sensation: History of peripheral neuropathy   Balance Static Standing Balance Static Standing - Balance Support: Bilateral upper extremity supported Static Standing - Level of Assistance: 5: Stand by assistance  End of Session PT - End of Session Equipment Utilized During Treatment: Oxygen Activity Tolerance: Patient limited by fatigue Patient left: in chair;with call bell/phone within reach Nurse Communication: Mobility status  GP     [facility] 05/25/2012, 11:10 AM  [employer] PT [phone]

## 2012-05-25 NOTE — Evaluation (Signed)
Occupational Therapy Evaluation and Discharge Patient Details Name: [name] MRN: [id number] DOB: August 15, 1932 Today's Date: 05/25/2012 Time: 7829-5621 OT Time Calculation (min): 1437 min  OT Assessment / Plan / Recommendation Clinical Impression  This 79 yo female readmitted with CHF would benefit from follow up OT at SNF to maximize I and safety. to decrease burden of care, and improve quality of life. Pt has consistently refused ST-SNF. Believe pt could benefit from ST-SNF if she would agree. No further acute OT needs, will sign off.    OT Assessment  Patient needs continued OT Services    Follow Up Recommendations  SNF    Barriers to Discharge Decreased caregiver support    Equipment Recommendations  None recommended by OT          Precautions / Restrictions Precautions Precautions: Fall   Pertinent Vitals/Pain DOE with activity    ADL  Grooming: Performed;Teeth care;Set up Where Assessed - Grooming: Supported sitting (reports this the way she always does it.) Equipment Used: Rolling walker Transfers/Ambulation Related to ADLs: Min guard A for all in her room. ADL Comments: Pt's aide helps her will all BADLs, cooking, and cleaning. On the day that the aid is not there, she only washes the "high spots" and only changes out her pajama bottoms.     OT Diagnosis: Generalized weakness;Acute pain  OT Problem List: Cardiopulmonary status limiting activity     Visit Information  Last OT Received On: 05/25/12 Assistance Needed: +1    Subjective Data  Subjective: I usually sit to brush my teeth   Prior Functioning     Home Living Lives With: Son Available Help at Discharge: Family;Available PRN/intermittently;Personal care attendant Type of Home: House Home Access: Stairs to enter [employer] of Steps: 2 Home Layout: One level Bathroom Shower/Tub: Forensic scientist: Standard Home Adaptive Equipment: Walker - rolling;Shower  chair with back Prior Function Level of Independence: Needs assistance Needs Assistance: Bathing;Light Housekeeping;Meal Prep Bath: Minimal Meal Prep: Moderate Light Housekeeping: Moderate Able to Take Stairs?: Yes Driving: No Vocation: On disability Comments: Usually ambulates at Mod I with RW, aide is there 6 days a week 9am-1pm and son is there to help as need on the 7th day Communication Communication: No difficulties Dominant Hand: Right         Vision/Perception Vision - History Baseline Vision: Wears glasses all the time   Cognition  Cognition Overall Cognitive Status: Appears within functional limits for tasks assessed/performed Arousal/Alertness: Awake/alert Orientation Level: Appears intact for tasks assessed Behavior During Session: [facility] for tasks performed    Extremity/Trunk Assessment Right Upper Extremity Assessment RUE ROM/Strength/Tone: Deficits RUE ROM/Strength/Tone Deficits: decreased AROM at shoulders due to OA Left Upper Extremity Assessment LUE ROM/Strength/Tone: Deficits LUE ROM/Strength/Tone Deficits: decreased AROM at shoulders due to OA     Mobility Bed Mobility Bed Mobility: Sitting - Scoot to Edge of Bed Supine to Sit: 4: Min assist;HOB elevated;With rails Sitting - Scoot to Edge of Bed: 5: Supervision Details for Bed Mobility Assistance: Pt up in recliner upon my arrival Transfers Transfers: Sit to Stand;Stand to Sit Sit to Stand: 4: Min guard;With upper extremity assist;With armrests;From chair/3-in-1 Stand to Sit: 4: Min guard;With upper extremity assist;With armrests;To chair/3-in-1           End of Session OT - End of Session Equipment Utilized During Treatment:  (RW) Activity Tolerance: Patient limited by fatigue Patient left: in chair;with call bell/phone within reach       Evette Georges [phone]  05/25/2012, 12:22 PM

## 2012-05-25 NOTE — Progress Notes (Signed)
Internal Medicine Teaching Service Attending Note Date: 05/25/2012  Patient name: TAELYN BROECKER  Medical record number: [id number]  Date of birth: Dec 04, 1932  Ms Stanislawski appears much better today. She says she walked and sat in the chair, and felt good. No new complaints.    05/25/12 0545  BP: 130/69  Pulse: 50  Temp: 97.8 F (36.6 C)  TempSrc: Oral  Resp: 18  Height:   Weight: 271 lb 2.7 oz (123 kg)  SpO2: 97%     General: Lying completely flat in bed with no distress.  Heart: S1S2 RRR, systolic murmur Lung: Crackles at lung bases Trace pedal edema   Recent Labs Lab 05/18/12 1354 05/19/12 1151 05/23/12 0444 05/24/12 0900 05/25/12 0453  NA 137 138 134* 136 138  K 5.5* 4.5 4.3 3.9 3.9  CL 95* 97 96 95* 97  CO2 27 30 28  32 34*  GLUCOSE 260* 335* 187* 146* 155*  BUN 29* 25* 16 23 30*  CREATININE 1.19* 1.12* 0.91 1.15* 1.21*  CALCIUM 10.2 9.8 9.8 9.6 9.6  MG  --   --  2.0  --   --     Recent Labs Lab 05/23/12 0444 05/24/12 0900 05/25/12 0453  INR 2.43* 2.48* 2.37*    Recent Labs Lab 05/24/12 1157 05/24/12 1637 05/24/12 2145 05/25/12 0702 05/25/12 1140  GLUCAP 185* 152* 158* 160* 192*    Assessment and Plan   Acute on Chronic diastolic CHF: Clinically much improved.  Weight change: -5 lb 15.2 oz (-2.7 kg) Cardiology rec appreciated. They would like to continue IV diuretics. We will keep monitoring creatinine which is trending up at present. With Ms Uffelman, it is always a hit and trial game deciding the right cardio-renal dose of lasix. Explained to her that with her moderate to severe AS, she will need very regular follow ups with her cardiologist, and be compliant on her medications to stay out of the hospital. Also explained that she might not be a very good candidate for surgery.   Disposition: Might be a SNF candidate. The son agrees that it is hard for him to take care of his mother at this point in time. Her son works long hours while she stays  at home. Will ask case manager to consult.     Rest stable chronic issues per resident note. Discussed care with team.    Dalphine Handing, [facility] 05/25/2012, 12:41 PM.

## 2012-05-25 NOTE — Progress Notes (Signed)
Subjective:  She feels better today. She was able to sit up in the chair  Objective: Vital signs in last 24 hours: Filed Vitals:   05/25/12 0545 05/25/12 0800 05/25/12 0920 05/25/12 1052  BP: 130/69   116/60  Pulse: 50     Temp: 97.8 F (36.6 C)     TempSrc: Oral     Resp: 18     Height:      Weight: 271 lb 2.7 oz (123 kg)     SpO2: 97% 96% 96%    Weight change: -5 lb 15.2 oz (-2.7 kg)  Intake/Output Summary (Last 24 hours) at 05/25/12 1310 Last data filed at 05/25/12 0008  Gross per 24 hour  Intake    583 ml  Output      0 ml  Net    583 ml   General: resting in bed, in NAD, having breakfast in bed. Looks comfortable HEENT: no scleral icterus Cardiac: irregularly irregular rate, distant heart sounds, no murmur appreciated Pulm: wheezes at the lung bases, inproved respiratory effort compared to yesterday Abd: obese, soft, nontender, nondistended, hypoactive BS present Ext: warm and well perfused, 1+ pitting edema bilaterally up to her knees Neuro: alert and oriented X3, cranial nerves II-XII grossly intact, strength and sensation to light touch equal in bilateral upper and lower extremities  Lab Results: Basic Metabolic Panel:  Recent Labs Lab 05/23/12 0444 05/24/12 0900 05/25/12 0453  NA 134* 136 138  K 4.3 3.9 3.9  CL 96 95* 97  CO2 28 32 34*  GLUCOSE 187* 146* 155*  BUN 16 23 30*  CREATININE 0.91 1.15* 1.21*  CALCIUM 9.8 9.6 9.6  MG 2.0  --   --    Liver Function Tests:  Recent Labs Lab 05/23/12 0444  AST 20  ALT 29  ALKPHOS 136*  BILITOT 1.0  PROT 7.1  ALBUMIN 3.1*   CBC:  Recent Labs Lab 05/23/12 0444 05/24/12 0900  WBC 14.5* 8.4  HGB 12.8 11.8*  HCT 37.8 37.0  MCV 89.8 90.9  PLT 284 224   Cardiac Enzymes:  Recent Labs Lab 05/24/12 1330  TROPONINI <0.30   BNP:  Recent Labs Lab 05/23/12 0444 05/25/12 0850  PROBNP 3018.0* 824.7*   CBG:  Recent Labs Lab 05/24/12 0550 05/24/12 1157 05/24/12 1637 05/24/12 2145  05/25/12 0702 05/25/12 1140  GLUCAP 156* 185* 152* 158* 160* 192*   Thyroid Function Tests:  Recent Labs Lab 05/23/12 1008  TSH 5.588*   Coagulation:  Recent Labs Lab 05/23/12 0444 05/24/12 0900 05/25/12 0453  LABPROT 25.3* 25.7* 24.8*  INR 2.43* 2.48* 2.37*     Micro Results: Recent Results (from the past 240 hour(s))  CULTURE, BLOOD (ROUTINE X 2)     Status: None   Collection Time    05/23/12  6:25 AM      Result Value Range Status   Specimen Description BLOOD LEFT ARM   Final   Special Requests BOTTLES DRAWN AEROBIC AND ANAEROBIC 10CC   Final   Culture  Setup Time 05/23/2012 18:02   Final   Culture     Final   Value: GRAM POSITIVE COCCI IN CLUSTERS     Note: Gram Stain Report Called to,Read Back By and Verified With: TINA WOODS 05/24/12 @ 12:14PM BY RUSCA.   Report Status PENDING   Incomplete  CULTURE, BLOOD (ROUTINE X 2)     Status: None   Collection Time    05/23/12  6:40 AM      Result  Value Range Status   Specimen Description BLOOD RIGHT ARM   Final   Special Requests BOTTLES DRAWN AEROBIC ONLY 10CC   Final   Culture  Setup Time 05/23/2012 18:02   Final   Culture     Final   Value:        BLOOD CULTURE RECEIVED NO GROWTH TO DATE CULTURE WILL BE HELD FOR 5 DAYS BEFORE ISSUING A FINAL NEGATIVE REPORT   Report Status PENDING   Incomplete   Studies/Results: Dg Chest 2 View  05/24/2012  *RADIOLOGY REPORT*  Clinical Data: Cough.  Weakness.  CHEST - 2 VIEW  Comparison: 05/23/2012.  Findings: Improved inspiration.  Stable enlarged cardiac silhouette.  Decreased prominence of the pulmonary vasculature and interstitial markings.  Minimal bilateral linear density. Unremarkable bones.  Inferior vena cava filter and upper abdominal surgical clips.  IMPRESSION:  1.  Improving changes of congestive heart failure. 2.  Minimal bilateral linear atelectasis. 3.  Stable cardiomegaly.   Original Report Authenticated By: Beckie Salts, M.D.    Medications:  Scheduled Meds: .  allopurinol  100 mg Oral BID  . amLODipine  5 mg Oral Daily  . aspirin EC  81 mg Oral Daily  . atorvastatin  40 mg Oral q1800  . FLUoxetine  20 mg Oral BID  . furosemide  40 mg Oral Daily  . gabapentin  100 mg Oral BID  . insulin aspart  0-5 Units Subcutaneous QHS  . insulin aspart  0-9 Units Subcutaneous TID WC  . ipratropium  0.5 mg Nebulization BID  . levothyroxine  150 mcg Oral QAC breakfast  . losartan  50 mg Oral Daily  . mometasone-formoterol  2 puff Inhalation BID  . montelukast  10 mg Oral QHS  . pantoprazole  40 mg Oral Daily  . polyethylene glycol  17 g Oral Daily  . sodium chloride  3 mL Intravenous Q12H  . sodium chloride  3 mL Intravenous Q12H  . warfarin  5 mg Oral q1800  . Warfarin - Physician Dosing Inpatient   Does not apply q1800   Continuous Infusions:  PRN Meds:.sodium chloride, acetaminophen, albuterol, loperamide, sodium chloride Assessment/Plan: 79 year old woman, with past medical history of congestive heart failure, diabetes, COPD, and atrial fibrillation rehospitalized with shortness of breath, and possible fluid overload.  Acute on chronic diastolic heart failure. She presented with increased shortness of breath and 1+ pitting edema bilaterally up to her knees on admission. Her weight was also up by 5 lbs and the pro-BNP elevated to 3018 from 1440 on 3/17. Her CXR revealed mild congestive heart failure. She takes PO Lasix 40mg  daily and she reported compliance. Echocardiogram shows moderate/severe AS. She is being treated with IV lasix. Creatinine slightly up from 1.15 to 1.21 and BUN also increased to 30 from 23. On admission creatinine was 0.91. Plan  -Continue with lasix from IV 40mg  daily - reduced from twice daily on 3/30 -Will monitor creatinine closely. -Weight is down by 5 pounds since admission. -We cannot monitor strict ins and outs due to urinary incontinence. -Continue ASA, Losartan, Norvasc. If Cre continue to increase, will d/c Losartan -No  BB given history of bradycardia and possible sick sinus syndrome  Acute renal insufficiency: Likely secondary to diuretics, with prerenal failure as discussed above. Plan -Will continue to monitor. -We'll be judicious about Lasix use if creatinine continues to trend up.  Positive Blood Culture: 1/2 blood cultures grew gram-positive cocci in clusters - likely contaminant. Patient currently without fevers, and without leukocytosis.  Plan  Awaiting isolation of the organism.  Leukocytosis. Resolved.  WBC of 14.5 on presentation, was 6.3 on 3/18. This could be secondary to secondary effect of recent steroid use or could be secondary to infectious process.   COPD. Appears stable at this time. She denies increased cough or sputum. She does have increased SOB but this is likely explained by her CHF exacerbation. She is on 2L [place] O2 at home and treatment with albuterol inhaler PRN, Advair, Spiriva, and Singulair.    -Continue albuterol inhaler -Atrovent Nebulizer q6h -Singulair -Dulera -IS/flutter valve  Atrial fibrillation. She has A.fib on presentation. She is on warfarin with INR of 2.43 at goal. She is followed by Dr. Alexandria Lodge and the [facility]co[facility]. She has been seen by Dr. Mayford Knife in Cardiology, was started on Diltiazem fro rate control but this medication was stopped secondary to bradycardia with rate in 50s. The patient has had asymptomatic nocturnal brachycardia since discontinuation of Diltiazem but per Cardiology, no interventions are needed for this problem at this time.   -Continue warfarin per pharmacy  Hypothyroidism. Last TSH 1.277 on 2/24 but had been 9.464 with medication adjustment on 04/01/12 (synthroid increased from daily to daily). The  repeated TSH is 5.588.   -Continue synthroid daily -may repeat TSH after acute issue resolves.  DM2. Well controlled, last HgA1C of 7.0% on 03/31/12. She is on glipizide 10mg  BID ac at home.   -Hold glipizide  while inpatient (in case pt needs to be NPO) -SSI ac HS -CBG q4h -Carb mod diet  HTN. BP well controlled on presentation at 136/71. She is on Norvasc, Cozaar, and Lasix at home and we will continues her current regimen in addition to IV Lasix.  Hyperlipidemia. Last LDL of 62 on 08/02/11. She is on Lipitor 40mg  at home which we will continue.   DVT prophylaxis: on warfarin. INR is therapeutic.   Disposition: Discussed with the patient's son in [place], who indicated that he feels that his mother would benefit from skilled nursing placement for at least a few weeks until she regains her strength. He is hopeful that after this temporary rehabilitation, she will be able to return home. The patient does not wish to go to into a permanent skilled nursing facility placement. We will consult CSW for SNF.   LOS: 2 days   Dow Adolph 05/25/2012, 1:10 PM

## 2012-05-25 NOTE — Progress Notes (Signed)
[facility]  Patient Status: Active (receiving services up to time of hospitalization)  [facility] is providing the following services: RN, PT and OT  If patient discharges after hours, please call [phone].   [name] 05/25/2012, 4:14 PM

## 2012-05-26 ENCOUNTER — Other Ambulatory Visit: Payer: Self-pay

## 2012-05-26 DIAGNOSIS — I359 Nonrheumatic aortic valve disorder, unspecified: Secondary | ICD-10-CM

## 2012-05-26 LAB — GLUCOSE, CAPILLARY
Glucose-Capillary: 149 mg/dL — ABNORMAL HIGH (ref 70–99)
Glucose-Capillary: 164 mg/dL — ABNORMAL HIGH (ref 70–99)
Glucose-Capillary: 150 mg/dL — ABNORMAL HIGH (ref 70–99)

## 2012-05-26 LAB — CULTURE, BLOOD (ROUTINE X 2)

## 2012-05-26 LAB — PROTIME-INR
INR: 2.3 — ABNORMAL HIGH (ref 0.00–1.49)
Prothrombin Time: 24.3 seconds — ABNORMAL HIGH (ref 11.6–15.2)

## 2012-05-26 LAB — BASIC METABOLIC PANEL
BUN: 35 mg/dL — ABNORMAL HIGH (ref 6–23)
Chloride: 98 mEq/L (ref 96–112)
Creatinine, Ser: 1.2 mg/dL — ABNORMAL HIGH (ref 0.50–1.10)
GFR calc Af Amer: 49 mL/min — ABNORMAL LOW (ref >90)
GFR calc non Af Amer: 42 mL/min — ABNORMAL LOW (ref >90)
Potassium: 3.9 mEq/L (ref 3.5–5.1)

## 2012-05-26 MED ORDER — FUROSEMIDE 40 MG PO TABS
40.0000 mg | ORAL_TABLET | Freq: Two times a day (BID) | ORAL | Status: DC
  Administered 2012-05-26 – 2012-05-27 (×3): 40 mg via ORAL
  Filled 2012-05-26 (×5): qty 1

## 2012-05-26 MED ORDER — TIOTROPIUM BROMIDE MONOHYDRATE 18 MCG IN CAPS
18.0000 ug | ORAL_CAPSULE | Freq: Every day | RESPIRATORY_TRACT | Status: DC
  Administered 2012-05-27: 18 ug via RESPIRATORY_TRACT
  Filled 2012-05-26: qty 5

## 2012-05-26 NOTE — Progress Notes (Signed)
INTERNAL MEDICINE TEACHING SERVICE Attending Note  Date: 05/26/2012  Patient name: [name]  Medical record number: [id number]  Date of birth: 1932-08-01    This patient has been seen and discussed with the house staff. Please see their note for complete details. I concur with their findings with the following additions/corrections: Feels well this morning. She has not walked around her room yet. She denies CP and current SOB. No N/V. Sitting up in chair. Admits to some constipation, but feels she will have a BM soon.   On exam, lungs CTA bilat. She has a 3/6 cres-decres murmur at LUSB. Slowly losing weight.  Continue diuresis.  Will need SNF placement, this is pending.  Jonah Blue, DO  05/26/2012, 2:57 PM

## 2012-05-26 NOTE — Progress Notes (Signed)
Physical Therapy Treatment Patient Details Name: [name] MRN: [id number] DOB: 10-15-1932 Today's Date: 05/26/2012 Time: 6606-3016 PT Time Calculation (min): 16 min  PT Assessment / Plan / Recommendation Comments on Treatment Session  Pt progressing in ambulation distance and increasing independence with bed mobility.  Pt would benefit from SNF 24-7 A/S due to continued limited mobility due to fatigue and SOB and (A) needed for safety.    Follow Up Recommendations  SNF     Does the patient have the potential to tolerate intense rehabilitation     Barriers to Discharge        Equipment Recommendations  None recommended by PT    Recommendations for Other Services    Frequency Min 3X/week   Plan Discharge plan remains appropriate;Frequency remains appropriate    Precautions / Restrictions Precautions Precautions: Fall Restrictions Weight Bearing Restrictions: No   Pertinent Vitals/Pain No indications of discomfort or pain.    Mobility  Bed Mobility Bed Mobility: Rolling Right;Rolling Left;Supine to Sit Rolling Right: 7: Independent Rolling Left: 7: Independent Supine to Sit: 6: Modified independent (Device/Increase time);With rails;HOB flat Sitting - Scoot to Edge of Bed: 6: Modified independent (Device/Increase time);With rail Details for Bed Mobility Assistance: Pt independent with rolling side to side, needs rails & more time for sit<>supine Transfers Transfers: Sit to Stand;Stand to Sit Sit to Stand: 4: Min guard;With upper extremity assist;From bed Stand to Sit: 4: Min guard;With upper extremity assist;To bed Details for Transfer Assistance: min guard for safety Ambulation/Gait Ambulation/Gait Assistance: 4: Min guard Ambulation Distance (Feet): 80 Feet Assistive device: Rolling walker Ambulation/Gait Assistance Details: Cues to stay within RW and for upright posture.  Min Guard for safety due to pt fatiguing quickly.   Gait Pattern: Step-through  pattern;Decreased stride length;Trunk flexed Gait velocity: decreased    Exercises     PT Diagnosis:    PT Problem List:   PT Treatment Interventions:     PT Goals Acute Rehab PT Goals Time For Goal Achievement: 06/01/12 Potential to Achieve Goals: Good Pt will go Supine/Side to Sit: with modified independence PT Goal: Supine/Side to Sit - Progress: Progressing toward goal Pt will go Sit to Supine/Side: with modified independence PT Goal: Sit to Supine/Side - Progress: Progressing toward goal Pt will go Sit to Stand: with modified independence PT Goal: Sit to Stand - Progress: Progressing toward goal Pt will go Stand to Sit: with modified independence PT Goal: Stand to Sit - Progress: Progressing toward goal Pt will Ambulate: 51 - 150 feet;with modified independence;with least restrictive assistive device PT Goal: Ambulate - Progress: Progressing toward goal  Visit Information  Last PT Received On: 05/26/12 Assistance Needed: +1    Subjective Data  Subjective: Pt denied pain.   Cognition  Cognition Overall Cognitive Status: Appears within functional limits for tasks assessed/performed Arousal/Alertness: Awake/alert Orientation Level: Appears intact for tasks assessed Behavior During Session: [facility] for tasks performed    Balance     End of Session PT - End of Session Equipment Utilized During Treatment: Oxygen;Gait belt Activity Tolerance: Patient limited by fatigue;Other (comment) ( and SOB) Patient left: in bed;with call bell/phone within reach Nurse Communication: Mobility status   GP     Enid Baas, SPTA 05/26/2012, 4:03 PM

## 2012-05-26 NOTE — Progress Notes (Signed)
Subjective:    Interval Events:  Doing well, no complaints. Says the social workers not been by yet. She wants all information from the social worker directed to her children. He continues to have 2 pillow orthopnea. Dyspnea is much improved but not quite to baseline. No chest pain or dizziness.     Objective:    Vital Signs:   Filed Vitals:   05/25/12 2123 05/25/12 2157 05/26/12 0423 05/26/12 0747  BP:  136/62 130/69   Pulse:  60 64   Temp:  98.2 F (36.8 C) 98.1 F (36.7 C)   TempSrc:  Oral Oral   Resp:  20 20   Height:      Weight:   270 lb 4.5 oz (122.6 kg)   SpO2: 95% 97% 96% 96%      Weights: 24-hour Weight change: -14.1 oz (-0.4 kg)  Filed Weights   05/24/12 0431 05/25/12 0545 05/26/12 0423  Weight: 272 lb 14.9 oz (123.8 kg) 271 lb 2.7 oz (123 kg) 270 lb 4.5 oz (122.6 kg)     Intake/Output:   Intake/Output Summary (Last 24 hours) at 05/26/12 1419 Last data filed at 05/26/12 0854  Gross per 24 hour  Intake    480 ml  Output      0 ml  Net    480 ml     Physical Exam: GENERAL: alert and oriented; resting comfortably in chair and in no distress EYES: pupils equal, round, and reactive to light; sclera anicteric ENT: moist mucosa NECK: no JVD LUNGS: clear to auscultation bilaterally, normal work of breathing HEART: normal rate; regular rhythm; normal S1 and S2, no S3 or S4 appreciated; no murmurs, rubs, or clicks ABDOMEN: soft, non-tender, normal bowel sounds, no masses palpated EXTREMITIES: Trace edema SKIN: normal turgor    Labs: Basic Metabolic Panel:  Recent Labs Lab 05/23/12 0444 05/24/12 0900 05/25/12 0453 05/26/12 0435  NA 134* 136 138 138  K 4.3 3.9 3.9 3.9  CL 96 95* 97 98  CO2 28 32 34* 31  GLUCOSE 187* 146* 155* 177*  BUN 16 23 30* 35*  CREATININE 0.91 1.15* 1.21* 1.20*  CALCIUM 9.8 9.6 9.6 9.5  MG 2.0  --   --   --     CBG:  Recent Labs Lab 05/25/12 1140 05/25/12 1628 05/25/12 2110 05/26/12 0548 05/26/12 1116    GLUCAP 192* 133* 191* 149* 216*    Coagulation Studies:  Recent Labs  05/24/12 0900 05/25/12 0453 05/26/12 0435  LABPROT 25.7* 24.8* 24.3*  INR 2.48* 2.37* 2.30*    Microbiology: Results for orders placed during the hospital encounter of 05/23/12  CULTURE, BLOOD (ROUTINE X 2)     Status: None   Collection Time    05/23/12  6:25 AM      Result Value Range Status   Specimen Description BLOOD LEFT ARM   Final   Special Requests BOTTLES DRAWN AEROBIC AND ANAEROBIC 10CC   Final   Culture  Setup Time 05/23/2012 18:02   Final   Culture     Final   Value: STAPHYLOCOCCUS SPECIES (COAGULASE NEGATIVE)     Note: THE SIGNIFICANCE OF ISOLATING THIS ORGANISM FROM A SINGLE SET OF BLOOD CULTURES WHEN MULTIPLE SETS ARE DRAWN IS UNCERTAIN. PLEASE NOTIFY THE MICROBIOLOGY DEPARTMENT WITHIN ONE WEEK IF SPECIATION AND SENSITIVITIES ARE REQUIRED.     Note: Gram Stain Report Called to,Read Back By and Verified With: TINA WOODS 05/24/12 @ 12:14PM BY RUSCA.   Report Status 05/26/2012 FINAL  Final  CULTURE, BLOOD (ROUTINE X 2)     Status: None   Collection Time    05/23/12  6:40 AM      Result Value Range Status   Specimen Description BLOOD RIGHT ARM   Final   Special Requests BOTTLES DRAWN AEROBIC ONLY 10CC   Final   Culture  Setup Time 05/23/2012 18:02   Final   Culture     Final   Value:        BLOOD CULTURE RECEIVED NO GROWTH TO DATE CULTURE WILL BE HELD FOR 5 DAYS BEFORE ISSUING A FINAL NEGATIVE REPORT   Report Status PENDING   Incomplete  CULTURE, BLOOD (SINGLE)     Status: None   Collection Time    05/25/12  8:50 AM      Result Value Range Status   Specimen Description BLOOD RIGHT ARM   Final   Special Requests BOTTLES DRAWN AEROBIC ONLY 8CC   Final   Culture  Setup Time 05/25/2012 14:30   Final   Culture     Final   Value:        BLOOD CULTURE RECEIVED NO GROWTH TO DATE CULTURE WILL BE HELD FOR 5 DAYS BEFORE ISSUING A FINAL NEGATIVE REPORT   Report Status PENDING   Incomplete      Medications:    Infusions:     Scheduled Medications: . allopurinol  100 mg Oral BID  . amLODipine  5 mg Oral Daily  . aspirin EC  81 mg Oral Daily  . atorvastatin  40 mg Oral q1800  . FLUoxetine  20 mg Oral BID  . furosemide  40 mg Oral BID  . gabapentin  100 mg Oral BID  . insulin aspart  0-5 Units Subcutaneous QHS  . insulin aspart  0-9 Units Subcutaneous TID WC  . ipratropium  0.5 mg Nebulization BID  . levothyroxine  150 mcg Oral QAC breakfast  . losartan  50 mg Oral Daily  . mometasone-formoterol  2 puff Inhalation BID  . montelukast  10 mg Oral QHS  . pantoprazole  40 mg Oral Daily  . polyethylene glycol  17 g Oral Daily  . sodium chloride  3 mL Intravenous Q12H  . sodium chloride  3 mL Intravenous Q12H  . warfarin  5 mg Oral q1800  . Warfarin - Physician Dosing Inpatient   Does not apply q1800     PRN Medications: sodium chloride, acetaminophen, albuterol, loperamide, sodium chloride    Assessment/ Plan:    1.   Acute exacerbation of chronic congestive heart failure secondary to aortic stenosis:  Echocardiogram from 04/21/2012 demonstrates an EF of 55%, normal diastolic function, but is severely stenotic aortic valve.  Her weight was up 5 pounds at admission and her proBNP was 2018. She was successfully diuresed and is now back to baseline. Cardiology saw her yesterday and they recommend increasing furosemide to 40 mg by mouth twice a day. She is not a candidate for beta-blockade because of prior bradycardia and possible sick sinus syndrome. She is appropriately on a daily aspirin and an ARB. - Furosemide 40 mg by mouth twice a day  - BMET tomorrow  2.   Positive blood cultures:  One of 2 blood cultures was positive for a coag-negative gram-positive cocci. A third blood culture is no growth to date. This is a contaminant.   3.   Leukocytosis, resolved:  Likely secondary to recent steroid use. No evidence of infection.   4.   COPD with  chronic respiratory  failure:  Stable. She is on 2 L of oxygen at home. We are continuing albuterol, tiotropium, and fluticasone-salmeterol.  5.   Hypertension:  At home, she takes amlodipine 5 mg daily and losartan 50 mg daily. Her blood pressure has been averaging around 130/70.  Here in the hospital, her BPs have been normal. - Continue amlodipine 5 mg daily - Continue losartan 50 mg daily   6.   Chronic atrial fibrillation:  On chronic warfarin therapy. INR is at goal with a value of 2.30. She was on diltiazem at one point but suffered bradycardic episodes with this. She is currently on no rate or rhythm controlling medicines. She follows with Dr. Alexandria Lodge in the warfarin clinic.  - Continue warfarin per pharmacy   7.   Hypothyroidism:  Post ablative. At home, she takes levothyroxine 150 mcg daily. TSH was 5.588 admission. We will continue treating with 150 mcg. Repeat TSH in the outpatient setting. - Continue levothyroxine 150 mcg daily  8.   Type II diabetes mellitus:  Reasonably well controlled. Last hemoglobin A1c was 7 in February. At home, she takes glipizide 10 mg twice a day. We are holding this while she is hospitalized and have her on correctional insulin.  - Sensitive scale correctional insulin aspart   9.   Dyslipidemia, low HDL:  Last lipid panel was on 08/02/2011: cholesterol 136, triglycerides 192, HDL 36, LDL 62. Current 10 year CVD risk is approximately 47%. She is on atorvastatin 40 mg. She would benefit from increasing this to 80 mg, but we will defer this to the outpatient setting.  - Continue atorvastatin 40 mg daily   10. Prophylaxis:  Therapeutic warfarin  11. Disposition:  Awaiting placement into a skilled nursing facility. Patient is an [facility] patient.     Length of Stay: 3 days   Signed by:  Dorthula Rue. Earlene Plater, MD PGY-I, Internal Medicine Pager [phone]  05/26/2012, 2:20 PM

## 2012-05-26 NOTE — Progress Notes (Signed)
Utilization Review Completed.   Jakayden Cancio, RN, BSN Nurse Case Manager  [phone]  

## 2012-05-26 NOTE — Progress Notes (Addendum)
SUBJECTIVE:  SOB much improved  OBJECTIVE:   Vitals:   Filed Vitals:   05/25/12 2123 05/25/12 2157 05/26/12 0423 05/26/12 0747  BP:  136/62 130/69   Pulse:  60 64   Temp:  98.2 F (36.8 C) 98.1 F (36.7 C)   TempSrc:  Oral Oral   Resp:  20 20   Height:      Weight:   122.6 kg (270 lb 4.5 oz)   SpO2: 95% 97% 96% 96%   I&O's:   Intake/Output Summary (Last 24 hours) at 05/26/12 0829 Last data filed at 05/25/12 1900  Gross per 24 hour  Intake    480 ml  Output      0 ml  Net    480 ml   TELEMETRY: Reviewed telemetry pt in atrial fibrillation     PHYSICAL EXAM General: Well developed, well nourished, in no acute distress Head: Eyes PERRLA, No xanthomas.   Normal cephalic and atramatic  Lungs:   Scant crackles at right base. Heart:   HRRR S1 S2 Pulses are 2+ & equal. Abdomen: Bowel sounds are positive, abdomen soft and non-tender without masses  Extremities:   No clubbing, cyanosis or edema.  DP +1 Neuro: Alert and oriented X 3. Psych:  Good affect, responds appropriately   LABS: Basic Metabolic Panel:  Recent Labs  08/65/78 0453 05/26/12 0435  NA 138 138  K 3.9 3.9  CL 97 98  CO2 34* 31  GLUCOSE 155* 177*  BUN 30* 35*  CREATININE 1.21* 1.20*  CALCIUM 9.6 9.5   Liver Function Tests: No results found for this basename: AST, ALT, ALKPHOS, BILITOT, PROT, ALBUMIN,  in the last 72 hours No results found for this basename: LIPASE, AMYLASE,  in the last 72 hours CBC:  Recent Labs  05/24/12 0900  WBC 8.4  HGB 11.8*  HCT 37.0  MCV 90.9  PLT 224   Cardiac Enzymes:  Recent Labs  05/24/12 1330  TROPONINI <0.30   BNP: No components found with this basename: POCBNP,  D-Dimer: No results found for this basename: DDIMER,  in the last 72 hours Hemoglobin A1C: No results found for this basename: HGBA1C,  in the last 72 hours Fasting Lipid Panel: No results found for this basename: CHOL, HDL, LDLCALC, TRIG, CHOLHDL, LDLDIRECT,  in the last 72 hours Thyroid  Function Tests:  Recent Labs  05/23/12 1008  TSH 5.588*   Anemia Panel: No results found for this basename: VITAMINB12, FOLATE, FERRITIN, TIBC, IRON, RETICCTPCT,  in the last 72 hours Coag Panel:   Lab Results  Component Value Date   INR 2.30* 05/26/2012   INR 2.37* 05/25/2012   INR 2.48* 05/24/2012    RADIOLOGY: Dg Chest 2 View  05/24/2012  *RADIOLOGY REPORT*  Clinical Data: Cough.  Weakness.  CHEST - 2 VIEW  Comparison: 05/23/2012.  Findings: Improved inspiration.  Stable enlarged cardiac silhouette.  Decreased prominence of the pulmonary vasculature and interstitial markings.  Minimal bilateral linear density. Unremarkable bones.  Inferior vena cava filter and upper abdominal surgical clips.  IMPRESSION:  1.  Improving changes of congestive heart failure. 2.  Minimal bilateral linear atelectasis. 3.  Stable cardiomegaly.   Original Report Authenticated By: Beckie Salts, M.D.    Dg Chest 2 View  05/23/2012  *RADIOLOGY REPORT*  Clinical Data: Shortness of breath.  CHEST - 2 VIEW  Comparison: Chest x-ray 05/12/2012.  Findings: Lung volumes are low.  Bibasilar opacities (left greater than right) favored to represent predominately subsegmental atelectasis, although  left lower lobe consolidation is difficult to exclude.  Probable small left pleural effusion. There is cephalization of the pulmonary vasculature and slight indistinctness of the interstitial markings suggestive of mild pulmonary edema.  Mild cardiomegaly. The patient is rotated to the left on today's exam, resulting in distortion of the mediastinal contours and reduced diagnostic sensitivity and specificity for mediastinal pathology.  Atherosclerosis in the thoracic aorta.  IMPRESSION: 1.  Findings, as above, suggestive of mild congestive heart failure. 2.  Low lung volumes with worsening aeration at the left base, concerning for atelectasis and/or consolidation.  There is also likely a small left pleural effusion and subsegmental  atelectasis in the right base. 3.  Atherosclerosis.   Original Report Authenticated By: Trudie Reed, M.D.    Dg Chest 2 View  05/12/2012  *RADIOLOGY REPORT*  Clinical Data: Shortness of breath  CHEST - 2 VIEW  Comparison: May 11, 2012  Findings:   The degree of inspiration is somewhat shallow. There is a new small area of infiltrate in the left mid lung.  Elsewhere, the lungs are clear.  Heart is mildly enlarged.  Pulmonary vascularity is within normal limits.  There is stable elevation of the right hemidiaphragm.  No adenopathy.  No bone lesions.  IMPRESSION: Small of infiltrate left mid lung.  Stable elevation of the right hemidiaphragm.  Mild cardiac enlargement is stable.   Original Report Authenticated By: Bretta Bang, M.D.    Dg Chest 2 View  05/11/2012  *RADIOLOGY REPORT*  Clinical Data: 79 year old female shortness of breath atrial fibrillation.  CHEST - 2 VIEW  Comparison: 04/20/2012 and earlier.  Findings: AP and lateral views of the chest.  Stable low lung volumes. Stable cardiomegaly and mediastinal contours.  No pneumothorax, pulmonary edema, pleural effusion or acute pulmonary opacity. Stable visualized osseous structures.  Numerous upper abdominal surgical clips.  IMPRESSION: No acute cardiopulmonary abnormality.   Original Report Authenticated By: Erskine Speed, M.D.     ASSESSMENT:  1. Acute on chronic diastolic CHF - I&O's not accurate due to incontinence.  Weight down 4 kg.  Creatinine stable and BNP significantly improved. 2. Nonobstructive ASCAD at cath 2006  3. Grave's disease with postablative hypothyroidism  4. Chronic atrial fibrillation on chronic systemic anticoagulation - rate controlled 5. HTN  6. Moderate AS by echo  7. Acute renal insufficiency secondary to diuresis   PLAN:  1. Change Lasix to 40mg  PO BID 2. Family requesting social worker to discuss SNF placement 3.  No new recs at this time - will sign off    Quintella Reichert, MD  05/26/2012  8:29  AM

## 2012-05-27 DIAGNOSIS — I10 Essential (primary) hypertension: Secondary | ICD-10-CM

## 2012-05-27 DIAGNOSIS — J449 Chronic obstructive pulmonary disease, unspecified: Secondary | ICD-10-CM

## 2012-05-27 LAB — PROTIME-INR
INR: 2.58 — ABNORMAL HIGH (ref 0.00–1.49)
Prothrombin Time: 26.4 seconds — ABNORMAL HIGH (ref 11.6–15.2)

## 2012-05-27 LAB — BASIC METABOLIC PANEL
CO2: 27 mEq/L (ref 19–32)
Calcium: 9.4 mg/dL (ref 8.4–10.5)
Creatinine, Ser: 1.05 mg/dL (ref 0.50–1.10)
GFR calc Af Amer: 57 mL/min — ABNORMAL LOW (ref >90)
Sodium: 135 mEq/L (ref 135–145)

## 2012-05-27 LAB — GLUCOSE, CAPILLARY
Glucose-Capillary: 239 mg/dL — ABNORMAL HIGH (ref 70–99)
Glucose-Capillary: 143 mg/dL — ABNORMAL HIGH (ref 70–99)

## 2012-05-27 MED ORDER — GLIPIZIDE 10 MG PO TABS: 10.0000 mg | ORAL_TABLET | Freq: Two times a day (BID) | ORAL | Status: DC

## 2012-05-27 MED ORDER — FUROSEMIDE 40 MG PO TABS: 40.0000 mg | ORAL_TABLET | Freq: Two times a day (BID) | ORAL | Status: DC

## 2012-05-27 NOTE — Clinical Social Work Placement (Signed)
     Clinical Social Work Department CLINICAL SOCIAL WORK PLACEMENT NOTE 05/27/2012  Patient:  [name], [name]  Account Number:  [id number] Admit date:  05/23/2012  Clinical Social Worker:  Read Drivers  Date/time:  05/27/2012 11:02 AM  Clinical Social Work is seeking post-discharge placement for this patient at the following level of care:   SKILLED NURSING   (*CSW will update this form in Epic as items are completed)   05/27/2012  Patient/family provided with [facility] Department of Clinical Social Works list of facilities offering this level of care within the geographic area requested by the patient (or if unable, by the patients family).  05/27/2012  Patient/family informed of their freedom to choose among providers that offer the needed level of care, that participate in Medicare, Medicaid or managed care program needed by the patient, have an available bed and are willing to accept the patient.  05/27/2012  Patient/family informed of [facility] ownership interest in [facility], as well as of the fact that they are under no obligation to receive care at this facility.  PASARR submitted to EDS on 05/27/2012 PASARR number received from EDS on 05/27/2012  FL2 transmitted to all facilities in geographic area requested by pt/family on  05/27/2012 FL2 transmitted to all facilities within larger geographic area on 05/27/2012  Patient informed that his/her managed care company has contracts with or will negotiate with  certain facilities, including the following:   [facility]     Patient/family informed of bed offers received:  05/27/2012 Patient chooses bed at [facility] AND [facility] Physician recommends and patient chooses bed at    Patient to be transferred to [facility] on  05/27/2012 Patient to be transferred to facility by Ambulance  Sharin Mons)  The following physician request were entered in Epic:   Additional  Comments: 05/27/12  OK per MD for d/c today to SNF.  [employer] auth obtained per Wille Celeste at [employer].  Notified patient, son Alinda Money and patient's nurse- Junius Roads.

## 2012-05-27 NOTE — Progress Notes (Signed)
Pt refused to ride to [facility] in ambulance. Called son at home he says he feels comfortable transporting her to nursing center. Pt on home 02 and son will bring tank and clothes.

## 2012-05-27 NOTE — Progress Notes (Signed)
[name], [place] [phone] 05/27/2012

## 2012-05-27 NOTE — Progress Notes (Signed)
Called [facility] with report.

## 2012-05-27 NOTE — Progress Notes (Signed)
Subjective:    Interval Events:  Patient feels well this morning. She says she slept on and off overnight. She says her breathing continues to improve but is not quite back to baseline.     Objective:    Vital Signs:   Filed Vitals:   05/26/12 1956 05/26/12 2128 05/27/12 0612 05/27/12 0801  BP:  111/61 122/68   Pulse:  52 60   Temp:  98.1 F (36.7 C) 98 F (36.7 C)   TempSrc:  Oral Oral   Resp:  18 18   Height:      Weight:   267 lb 3.2 oz (121.2 kg)   SpO2: 95% 97% 96% 95%    Weights: 24-hour Weight change: -3 lb 1.4 oz (-1.4 kg)  Filed Weights   05/25/12 0545 05/26/12 0423 05/27/12 0612  Weight: 271 lb 2.7 oz (123 kg) 270 lb 4.5 oz (122.6 kg) 267 lb 3.2 oz (121.2 kg)   Net since admission:  -3.539 kg   Intake/Output:   Intake/Output Summary (Last 24 hours) at 05/27/12 0850 Last data filed at 05/27/12 7829  Gross per 24 hour  Intake    960 ml  Output      0 ml  Net    960 ml      Physical Exam: GENERAL: alert and oriented; resting comfortably in chair and in no distress EYES: pupils equal, round, and reactive to light; sclera anicteric ENT: moist mucosa LUNGS: clear to auscultation bilaterally, normal work of breathing HEART: normal rate; regular rhythm; S1 and S2 obliterated by systolic murmur heard throughout precordium ABDOMEN: soft, non-tender, normal bowel sounds, no masses palpated EXTREMITIES: No edema SKIN: normal turgor    Labs: Basic Metabolic Panel:  Recent Labs Lab 05/23/12 0444 05/24/12 0900 05/25/12 0453 05/26/12 0435 05/27/12 0525  NA 134* 136 138 138 135  K 4.3 3.9 3.9 3.9 4.6  CL 96 95* 97 98 97  CO2 28 32 34* 31 27  GLUCOSE 187* 146* 155* 177* 155*  BUN 16 23 30* 35* 30*  CREATININE 0.91 1.15* 1.21* 1.20* 1.05  CALCIUM 9.8 9.6 9.6 9.5 9.4  MG 2.0  --   --   --   --     CBG:  Recent Labs Lab 05/26/12 0548 05/26/12 1116 05/26/12 1652 05/26/12 2114 05/27/12 0554  GLUCAP 149* 216* 164* 150* 152*    Coagulation  Studies:  Recent Labs  05/24/12 0900 05/25/12 0453 05/26/12 0435 05/27/12 0525  LABPROT 25.7* 24.8* 24.3* 26.4*  INR 2.48* 2.37* 2.30* 2.58*    Microbiology: Results for orders placed during the hospital encounter of 05/23/12  CULTURE, BLOOD (ROUTINE X 2)     Status: None   Collection Time    05/23/12  6:25 AM      Result Value Range Status   Specimen Description BLOOD LEFT ARM   Final   Special Requests BOTTLES DRAWN AEROBIC AND ANAEROBIC 10CC   Final   Culture  Setup Time 05/23/2012 18:02   Final   Culture     Final   Value: STAPHYLOCOCCUS SPECIES (COAGULASE NEGATIVE)     Note: THE SIGNIFICANCE OF ISOLATING THIS ORGANISM FROM A SINGLE SET OF BLOOD CULTURES WHEN MULTIPLE SETS ARE DRAWN IS UNCERTAIN. PLEASE NOTIFY THE MICROBIOLOGY DEPARTMENT WITHIN ONE WEEK IF SPECIATION AND SENSITIVITIES ARE REQUIRED.     Note: Gram Stain Report Called to,Read Back By and Verified With: TINA WOODS 05/24/12 @ 12:14PM BY RUSCA.   Report Status 05/26/2012 FINAL   Final  CULTURE, BLOOD (ROUTINE X 2)     Status: None   Collection Time    05/23/12  6:40 AM      Result Value Range Status   Specimen Description BLOOD RIGHT ARM   Final   Special Requests BOTTLES DRAWN AEROBIC ONLY 10CC   Final   Culture  Setup Time 05/23/2012 18:02   Final   Culture     Final   Value:        BLOOD CULTURE RECEIVED NO GROWTH TO DATE CULTURE WILL BE HELD FOR 5 DAYS BEFORE ISSUING A FINAL NEGATIVE REPORT   Report Status PENDING   Incomplete  CULTURE, BLOOD (SINGLE)     Status: None   Collection Time    05/25/12  8:50 AM      Result Value Range Status   Specimen Description BLOOD RIGHT ARM   Final   Special Requests BOTTLES DRAWN AEROBIC ONLY 8CC   Final   Culture  Setup Time 05/25/2012 14:30   Final   Culture     Final   Value:        BLOOD CULTURE RECEIVED NO GROWTH TO DATE CULTURE WILL BE HELD FOR 5 DAYS BEFORE ISSUING A FINAL NEGATIVE REPORT   Report Status PENDING   Incomplete     Medications:     Infusions:     Scheduled Medications: . allopurinol  100 mg Oral BID  . amLODipine  5 mg Oral Daily  . aspirin EC  81 mg Oral Daily  . atorvastatin  40 mg Oral q1800  . FLUoxetine  20 mg Oral BID  . furosemide  40 mg Oral BID  . gabapentin  100 mg Oral BID  . insulin aspart  0-5 Units Subcutaneous QHS  . insulin aspart  0-9 Units Subcutaneous TID WC  . levothyroxine  150 mcg Oral QAC breakfast  . losartan  50 mg Oral Daily  . mometasone-formoterol  2 puff Inhalation BID  . montelukast  10 mg Oral QHS  . pantoprazole  40 mg Oral Daily  . polyethylene glycol  17 g Oral Daily  . sodium chloride  3 mL Intravenous Q12H  . tiotropium  18 mcg Inhalation Daily  . warfarin  5 mg Oral q1800  . Warfarin - Physician Dosing Inpatient   Does not apply q1800     PRN Medications: sodium chloride, acetaminophen, albuterol, loperamide, sodium chloride    Assessment/ Plan:    1.   Acute exacerbation of chronic congestive heart failure secondary to aortic stenosis:  Echocardiogram from 04/21/2012 demonstrates an EF of 55%, normal diastolic function, but is severely stenotic aortic valve.  Her weight was up 5 pounds at admission and her proBNP was 2018. She was successfully diuresed and is now back to baseline. Cardiology saw her yesterday and they recommend increasing furosemide to 40 mg by mouth twice a day. She is not a candidate for beta-blockade because of prior bradycardia and possible sick sinus syndrome. She is appropriately on a daily aspirin and an ARB. - Furosemide 40 mg by mouth twice a day  - BMET tomorrow  2.   Positive blood cultures:  One of 2 blood cultures was positive for a coag-negative gram-positive cocci. A third blood culture is no growth to date. This is a contaminant.   3.   Leukocytosis, resolved:  Likely secondary to recent steroid use. No evidence of infection.   4.   COPD with chronic respiratory failure:  Stable. She is on 2 L of  oxygen at home. We are continuing  albuterol, tiotropium, and fluticasone-salmeterol.  5.   Hypertension:  At home, she takes amlodipine 5 mg daily and losartan 50 mg daily. Her blood pressure has been averaging around 130/70.  Here in the hospital, her BPs have been normal. - Continue amlodipine 5 mg daily - Continue losartan 50 mg daily   6.   Chronic atrial fibrillation:  On chronic warfarin therapy. INR is at goal with a value of 2.58. She was on diltiazem at one point but suffered bradycardic episodes with this. She is currently on no rate or rhythm controlling medicines. She follows with Dr. Alexandria Lodge in the warfarin clinic.  - Continue warfarin 5 mg daily  7.   Hypothyroidism:  Post ablative. At home, she takes levothyroxine 150 mcg daily. TSH was 5.588 admission. We will continue treating with 150 mcg. Repeat TSH in the outpatient setting. - Continue levothyroxine 150 mcg daily  8.   Type II diabetes mellitus:  Reasonably well controlled. Last hemoglobin A1c was 7 in February. At home, she takes glipizide 10 mg twice a day. We are holding this while she is hospitalized and have her on correctional insulin.  - Sensitive scale correctional insulin aspart   9.   Dyslipidemia, low HDL:  Last lipid panel was on 08/02/2011: cholesterol 136, triglycerides 192, HDL 36, LDL 62. Current 10 year CVD risk is approximately 47%. She is on atorvastatin 40 mg. She would benefit from increasing this to 80 mg, but we will defer this to the outpatient setting.  - Continue atorvastatin 40 mg daily   10. Prophylaxis:  Therapeutic warfarin  11. Disposition:  Awaiting placement into a skilled nursing facility. Patient is an [facility] patient.     Length of Stay: 4 days   Signed by:  Dorthula Rue. Earlene Plater, MD PGY-I, Internal Medicine Pager [phone]  05/27/2012, 8:50 AM

## 2012-05-27 NOTE — Progress Notes (Signed)
Pt discharged per w/c accompanied by nurse tech and son. RN dressed pt and placed peripad and chux padded in pt's pants to prevent any inc leak in car ride. Pt has all belongings. Son has packet of pt info to give to [facility]. Gave all 6 pm meds except for Lasix po. Written in 2 places that this is only 6 o'clock med not given since pt was transported per private vehicle.

## 2012-05-27 NOTE — Discharge Summary (Signed)
Patient Name:  [name] MRN: [id number]  PCP: Aletta Edouard, MD DOB:  Sep 22, 1932       Date of Admission:  05/23/2012  Date of Discharge:  05/27/2012      Attending Physician: Dr. Kem Kays         DISCHARGE DIAGNOSES: 1. Acute exacerbation of chronic congestive heart failure secondary to aortic stenosis 2. Leukocytosis, resolved 3. COPD with chronic respiratory failure 4. Hypertension 5. Chronic atrial fibrillation 6. Hypothyroidism 7. Type 2 diabetes 8. Dyslipidemia, low HDL    DISPOSITION AND FOLLOW-UP: [name] is to follow-up with the listed providers as detailed below, at which time, the following should be addressed:  1. Follow-up visits: 1. CHF/AS: Assess fluid status  2. AF/Anticoagulation:  INR 3. Hypothyroidism:  TSH high, recheck after acute issues resolved and adjust accordingly   2. Labs and images needed: 1. INR 2. TSH  3. Pending labs and tests needing follow-up:  NONE        Discharge Orders   Future Appointments Provider Department Dept Phone   06/08/2012 11:00 AM [facility] [phone]   06/09/2012 10:15 AM Aletta Edouard, MD [facility] [phone]   06/23/2012 8:15 AM Sherrie George, MD [facility] [phone]   Future Orders Complete By Expires     Call MD for:  difficulty breathing, headache or visual disturbances  As directed     Call MD for:  extreme fatigue  As directed     Call MD for:  persistant dizziness or light-headedness  As directed     Diet - low sodium heart healthy  As directed     Increase activity slowly  As directed         DISCHARGE MEDICATIONS:   Medication List    STOP taking these medications       doxycycline 100 MG tablet  Commonly known as:  VIBRA-TABS      TAKE these medications       acetaminophen 500 MG tablet  Commonly known as:  TYLENOL  Take 1,000 mg by mouth 2 (two) times daily as  needed. For pain.     albuterol 108 (90 BASE) MCG/ACT inhaler  Commonly known as:  PROVENTIL HFA  Inhale 2 puffs into the lungs every 6 (six) hours as needed. For wheezing.     allopurinol 100 MG tablet  Commonly known as:  ZYLOPRIM  Take 100 mg by mouth 2 (two) times daily.     amLODipine 5 MG tablet  Commonly known as:  NORVASC  Take 5 mg by mouth daily.     aspirin 81 MG EC tablet  Take 1 tablet (81 mg total) by mouth daily.     atorvastatin 40 MG tablet  Commonly known as:  LIPITOR  Take 40 mg by mouth daily.     FLUoxetine 20 MG capsule  Commonly known as:  PROZAC  Take 20 mg by mouth 2 (two) times daily.     Fluticasone-Salmeterol 250-50 MCG/DOSE Aepb  Commonly known as:  ADVAIR  Inhale 1 puff into the lungs every 12 (twelve) hours.     furosemide 40 MG tablet  Commonly known as:  LASIX  Take 1 tablet (40 mg total) by mouth 2 (two) times daily.     gabapentin 100 MG capsule  Commonly known as:  NEURONTIN  Take 100 mg by mouth 2 (two) times daily.     glipiZIDE  10 MG tablet  Commonly known as:  GLUCOTROL  Take 1 tablet (10 mg total) by mouth 2 (two) times daily before a meal.     levothyroxine 150 MCG tablet  Commonly known as:  SYNTHROID  Take 1 tablet (150 mcg total) by mouth daily.     loperamide 2 MG capsule  Commonly known as:  IMODIUM  Take 1 capsule (2 mg total) by mouth as needed for diarrhea or loose stools (up to total 16mg  daily).     losartan 50 MG tablet  Commonly known as:  COZAAR  Take 50 mg by mouth daily.     montelukast 10 MG tablet  Commonly known as:  SINGULAIR  Take 10 mg by mouth at bedtime.     omeprazole 20 MG capsule  Commonly known as:  PRILOSEC  Take 40 mg by mouth 2 (two) times daily.     tiotropium 18 MCG inhalation capsule  Commonly known as:  SPIRIVA HANDIHALER  Place 1 capsule (18 mcg total) into inhaler and inhale daily.     warfarin 5 MG tablet  Commonly known as:  COUMADIN  Take 5 mg by mouth daily.          CONSULTS: Cardiology   PROCEDURES PERFORMED:  Dg Chest 2 View  05/24/2012  *RADIOLOGY REPORT*  Clinical Data: Cough.  Weakness.  CHEST - 2 VIEW  Comparison: 05/23/2012.  Findings: Improved inspiration.  Stable enlarged cardiac silhouette.  Decreased prominence of the pulmonary vasculature and interstitial markings.  Minimal bilateral linear density. Unremarkable bones.  Inferior vena cava filter and upper abdominal surgical clips.  IMPRESSION:  1.  Improving changes of congestive heart failure. 2.  Minimal bilateral linear atelectasis. 3.  Stable cardiomegaly.   Original Report Authenticated By: Beckie Salts, M.D.    Dg Chest 2 View  05/23/2012  *RADIOLOGY REPORT*  Clinical Data: Shortness of breath.  CHEST - 2 VIEW  Comparison: Chest x-ray 05/12/2012.  Findings: Lung volumes are low.  Bibasilar opacities (left greater than right) favored to represent predominately subsegmental atelectasis, although left lower lobe consolidation is difficult to exclude.  Probable small left pleural effusion. There is cephalization of the pulmonary vasculature and slight indistinctness of the interstitial markings suggestive of mild pulmonary edema.  Mild cardiomegaly. The patient is rotated to the left on today's exam, resulting in distortion of the mediastinal contours and reduced diagnostic sensitivity and specificity for mediastinal pathology.  Atherosclerosis in the thoracic aorta.  IMPRESSION: 1.  Findings, as above, suggestive of mild congestive heart failure. 2.  Low lung volumes with worsening aeration at the left base, concerning for atelectasis and/or consolidation.  There is also likely a small left pleural effusion and subsegmental atelectasis in the right base. 3.  Atherosclerosis.   Original Report Authenticated By: Trudie Reed, M.D.    Dg Chest 2 View  05/12/2012  *RADIOLOGY REPORT*  Clinical Data: Shortness of breath  CHEST - 2 VIEW  Comparison: May 11, 2012  Findings:   The degree of  inspiration is somewhat shallow. There is a new small area of infiltrate in the left mid lung.  Elsewhere, the lungs are clear.  Heart is mildly enlarged.  Pulmonary vascularity is within normal limits.  There is stable elevation of the right hemidiaphragm.  No adenopathy.  No bone lesions.  IMPRESSION: Small of infiltrate left mid lung.  Stable elevation of the right hemidiaphragm.  Mild cardiac enlargement is stable.   Original Report Authenticated By: Bretta Bang, M.D.  Dg Chest 2 View  05/11/2012  *RADIOLOGY REPORT*  Clinical Data: 79 year old female shortness of breath atrial fibrillation.  CHEST - 2 VIEW  Comparison: 04/20/2012 and earlier.  Findings: AP and lateral views of the chest.  Stable low lung volumes. Stable cardiomegaly and mediastinal contours.  No pneumothorax, pulmonary edema, pleural effusion or acute pulmonary opacity. Stable visualized osseous structures.  Numerous upper abdominal surgical clips.  IMPRESSION: No acute cardiopulmonary abnormality.   Original Report Authenticated By: Erskine Speed, M.D.        ADMISSION DATA: H&P: Ms. Sundby is a 79 year old woman with PMH of DM2, hyperlipidemia, HTN, A. Fib on warfarin, history of PE, CHF (EF of 55% per 2D echo on 04/21/12 ), CAD (MI 20 yrs ago, cath in 2006 with nonobstructive disease, 30-40% stenosis on circumflex), severe aortic stenosis (mild regurgitation, 0.57cm^2 area per last 2D echo), CVA (with no residual deficit), and COPD who presents [facility] ED via EMS after not being able to get up form bed earlier this morning due to generalized weakness and shortness of breath. She was recently hospitalized and treated for community acquired pneumonia and CHF exacerbation with discharge on 05/14/12. She reports taking all her medicines as prescribed with last dose of doxycyline two days ago. Her cough has improved and is now intermittent with scant white sputum. Her appetite is good and she denies dietary indiscretion. She weighs  herself everyday but does not know if her weight has increased recently. She lives at home with her son who works during the day but she has a Therapist, art during the week from 9AM to 1PM and a physical therapist that comes 2-3 per week after 1PM. She walks with a walker at home, she denies falls.  She denies confusion, headache, slurred speech, chest pain, abdominal pain, nausea, vomiting, diarrhea, constipation, dysuria, or hematuria. She had nosebleed with minimum blood loss a few days ago but denies overt bleeding.    Physical Exam: Vitals: Blood pressure 148/51, pulse 70, temperature 99.3 F (37.4 C), temperature source Oral, resp. rate 18, height 5\' 2"  (1.575 m), weight 275 lb (124.739 kg), last menstrual period 05/22/1968, SpO2 96.00%.  Vitals reviewed.  General: resting in bed, in NAD, with intermitted cough  HEENT: no scleral icterus  Cardiac: irregularly irregular rate, distant heart sounds secondary to body habitus, no murmur appreciated  Pulm: soft inspiratory wheezes at the lung bases, poor respiratory effort, no respiratory distress  Abd: obese, soft, nontender, nondistended, hypoactive BS present  Ext: warm and well perfused, 1+ pitting edema bilaterally up to her knees  Neuro: alert and oriented X3, cranial nerves II-XII grossly intact, strength and sensation to light touch equal in bilateral upper and lower extremities   Labs: Basic Metabolic Panel:   05/23/12 0444   NA  134*   K  4.3   CL  96   CO2  28   GLUCOSE  187*   BUN  16   CREATININE  0.91   CALCIUM  9.8   MG  2.0    Liver Function Tests:   05/23/12 0444   AST  20   ALT  29   ALKPHOS  136*   BILITOT  1.0   PROT  7.1   ALBUMIN  3.1*    CBC:   05/23/12 0444   WBC  14.5*   HGB  12.8   HCT  37.8   MCV  89.8   PLT  284    BNP:  05/23/12 0444   PROBNP  3018.0*    Coagulation:   05/23/12 0444   LABPROT  25.3*   INR  2.43*       HOSPITAL COURSE: 1.   Acute exacerbation of chronic  congestive heart failure secondary to aortic stenosis:  Echocardiogram from 04/21/2012 demonstrates an EF of 55%, normal diastolic function, but is severely stenotic aortic valve. Her weight was up 5 pounds at admission and her proBNP was 2018. She was successfully diuresed and is now back to baseline. Cardiology saw her and they recommend increasing furosemide to 40 mg by mouth twice a day. She is not a candidate for beta-blockade because of prior bradycardia and possible sick sinus syndrome. She is appropriately on a daily aspirin and an ARB.    2.   Positive blood cultures:  One of 2 blood cultures was positive for a coag-negative gram-positive cocci. A third blood culture is no growth to date. This is a contaminant.   3.   Leukocytosis, resolved:  Likely secondary to recent steroid use. No evidence of infection.   4.   COPD with chronic respiratory failure:  Stable. She is on 2 L of oxygen at home. We are continuing albuterol, tiotropium, and fluticasone-salmeterol.   5.   Hypertension:  At home, she takes amlodipine 5 mg daily and losartan 50 mg daily. Her blood pressure has been averaging around 130/70. Here in the hospital, her BPs have been normal.   6.   Chronic atrial fibrillation:  On chronic warfarin therapy. INR is at goal with a value of 2.58. She was on diltiazem at one point but suffered bradycardic episodes with this. She is currently on no rate or rhythm controlling medicines. She follows with Dr. Alexandria Lodge in the warfarin clinic.   7.   Hypothyroidism:  Post ablative. At home, she takes levothyroxine 150 mcg daily. TSH was 5.588 admission. We will continue treating with 150 mcg. Repeat TSH in the outpatient setting.   8.   Type II diabetes mellitus:  Reasonably well controlled. Last hemoglobin A1c was 7 in February. At home, she takes glipizide 10 mg twice a day. We are holding this while she is hospitalized and have her on correctional insulin.   9.   Dyslipidemia, low HDL:  Last  lipid panel was on 08/02/2011: cholesterol 136, triglycerides 192, HDL 36, LDL 62. Current 10 year CVD risk is approximately 47%. She is on atorvastatin 40 mg. She would benefit from increasing this to 80 mg, but we will defer this to the outpatient setting.    DISCHARGE DATA: Vital Signs: BP 128/60  Pulse 60  Temp(Src) 98 F (36.7 C) (Oral)  Resp 18  Ht 5\' 2"  (1.575 m)  Wt 267 lb 3.2 oz (121.2 kg)  BMI 48.86 kg/m2  SpO2 95%  LMP 05/22/1968  Labs: Results for orders placed during the hospital encounter of 05/23/12 (from the past 24 hour(s))  GLUCOSE, CAPILLARY     Status: Abnormal   Collection Time    05/26/12  4:52 PM      Result Value Range   Glucose-Capillary 164 (*) 70 - 99 mg/dL   Comment 1 Documented in Chart    GLUCOSE, CAPILLARY     Status: Abnormal   Collection Time    05/26/12  9:14 PM      Result Value Range   Glucose-Capillary 150 (*) 70 - 99 mg/dL   Comment 1 Notify RN    PROTIME-INR     Status: Abnormal  Collection Time    05/27/12  5:25 AM      Result Value Range   Prothrombin Time 26.4 (*) 11.6 - 15.2 seconds   INR 2.58 (*) 0.00 - 1.49  BASIC METABOLIC PANEL     Status: Abnormal   Collection Time    05/27/12  5:25 AM      Result Value Range   Sodium 135  135 - 145 mEq/L   Potassium 4.6  3.5 - 5.1 mEq/L   Chloride 97  96 - 112 mEq/L   CO2 27  19 - 32 mEq/L   Glucose, Bld 155 (*) 70 - 99 mg/dL   BUN 30 (*) 6 - 23 mg/dL   Creatinine, Ser 1.61  0.50 - 1.10 mg/dL   Calcium 9.4  8.4 - 09.6 mg/dL   GFR calc non Af Amer 50 (*) >90 mL/min   GFR calc Af Amer 57 (*) >90 mL/min  GLUCOSE, CAPILLARY     Status: Abnormal   Collection Time    05/27/12  5:54 AM      Result Value Range   Glucose-Capillary 152 (*) 70 - 99 mg/dL   Comment 1 Notify RN    GLUCOSE, CAPILLARY     Status: Abnormal   Collection Time    05/27/12 11:24 AM      Result Value Range   Glucose-Capillary 239 (*) 70 - 99 mg/dL     Time spent on discharge: 31 minutes   Services Ordered  on Discharge: 1. PT - no 2. OT - no 3. RN - no 4. Other - SNF/Rehab   Signed by:  Dorthula Rue. Earlene Plater, MD PGY-I, Internal Medicine  05/27/2012, 1:46 PM

## 2012-05-27 NOTE — Clinical Social Work Psychosocial (Signed)
Clinical Social Work Department BRIEF PSYCHOSOCIAL ASSESSMENT 05/27/2012  Patient:  RUFUS, CYPERT     Account Number:  [id number]     Admit date:  05/23/2012  Clinical Social Worker:  Read Drivers  Date/Time:  05/27/2012 10:24 AM  Referred by:  RN  Date Referred:  05/27/2012 Referred for  SNF Placement   Other Referral:   none   Interview type:  Patient Other interview type:   none    PSYCHOSOCIAL DATA Living Status:  WITH ADULT CHILDREN Admitted from facility:   Level of care:   Primary support name:  Alinda Money and Laurence Ferrari Primary support relationship to patient:  CHILD, ADULT Degree of support available:   adequate support though 24 hour supervision is not available    CURRENT CONCERNS Current Concerns  Post-Acute Placement   Other Concerns:   none    SOCIAL WORK ASSESSMENT / PLAN CSW met with pt at bedside to discuss PT recommendations of SNF before returning home.  Pt was hesitant about obtaining rehab in a facility as she already receives HH 2x wk.  Pt stated her insurance would not cover more HH.  CSW and pt discussed the need for rehab and 24 hour supervision.  Pt acknowledged she has had 3 hospital visits in the past 6 weeks and does feel that rehab would be beneficial.  Pt has been a resident at [facility] (aprox 10 years ago) and states she had a good experience and would possibly like to return.  Pt requested CSW to contact sons and discuss this option with them as well.  CSW agreed.   Assessment/plan status:  Psychosocial Support/Ongoing Assessment of Needs Other assessment/ plan:   none   Information/referral to community resources:   SNF    PATIENT'S/FAMILY'S RESPONSE TO PLAN OF CARE: Pt was very appreciative of CSW time and efforts in safe d/c.        Vickii Penna, LCSWA [phone]  Clinical Social Work

## 2012-05-27 NOTE — Progress Notes (Signed)
INTERNAL MEDICINE TEACHING SERVICE Attending Note  Date: 05/27/2012  Patient name: [name]  Medical record number: [id number]  Date of birth: May 08, 1932    This patient has been seen and discussed with the house staff. Please see their note for complete details. I concur with their findings with the following additions/corrections: Feels well this morning. Constipation resolved. Her severe AS with acute diastolic CHF is compensated.  She has no complaints today. Stable for discharge with PCP f/u to SNF when bed available.  Jonah Blue, DO  05/27/2012, 1:26 PM

## 2012-05-28 NOTE — Discharge Summary (Signed)
INTERNAL MEDICINE TEACHING SERVICE Attending Note  Date: 05/28/2012  Patient name: [name]  Medical record number: [id number]  Date of birth: 1932-10-20    This patient has been seen and discussed with the house staff. Please see their note for complete details. I concur with their findings and plan.   Jonah Blue, DO  05/28/2012, 1:50 PM

## 2012-05-29 LAB — CULTURE, BLOOD (ROUTINE X 2): Culture: NO GROWTH

## 2012-05-31 LAB — CULTURE, BLOOD (SINGLE)

## 2012-06-08 ENCOUNTER — Ambulatory Visit: Payer: Medicare PPO

## 2012-06-09 ENCOUNTER — Encounter: Payer: Self-pay | Admitting: Internal Medicine

## 2012-06-09 ENCOUNTER — Ambulatory Visit: Payer: Medicare PPO | Admitting: Internal Medicine

## 2012-06-23 ENCOUNTER — Encounter (INDEPENDENT_AMBULATORY_CARE_PROVIDER_SITE_OTHER): Payer: Medicare PPO | Admitting: Ophthalmology

## 2012-06-23 DIAGNOSIS — H353290 Exudative age-related macular degeneration, unspecified eye, stage unspecified: Secondary | ICD-10-CM

## 2012-06-23 DIAGNOSIS — H43819 Vitreous degeneration, unspecified eye: Secondary | ICD-10-CM

## 2012-06-23 DIAGNOSIS — H35039 Hypertensive retinopathy, unspecified eye: Secondary | ICD-10-CM

## 2012-06-23 DIAGNOSIS — H3530 Unspecified macular degeneration: Secondary | ICD-10-CM

## 2012-06-23 DIAGNOSIS — I10 Essential (primary) hypertension: Secondary | ICD-10-CM

## 2012-06-24 ENCOUNTER — Telehealth: Payer: Self-pay | Admitting: *Deleted

## 2012-06-24 NOTE — Telephone Encounter (Signed)
Call from [place], RN with [facility] - # [phone] Calls to report PT/INR results  PT - 24.3   INR 2.0 Pt is taking coumadin 2.5 mg on Wed and Fri  and takes coumadin 5 mg on all other days.  Last visit with Dr Alexandria Lodge 3/24 Pt in hospital 3/29 - 4/2 for CHF Hx: AF

## 2012-06-24 NOTE — Telephone Encounter (Signed)
INR is at target given atrial fibrillation as indication.  No changes necessary.  Will continue current dose and follow-up with Dr. Alexandria Lodge as scheduled.  Thank you for the information.

## 2012-06-26 ENCOUNTER — Ambulatory Visit (INDEPENDENT_AMBULATORY_CARE_PROVIDER_SITE_OTHER): Payer: Medicare PPO | Admitting: Internal Medicine

## 2012-06-26 ENCOUNTER — Encounter: Payer: Self-pay | Admitting: Internal Medicine

## 2012-06-26 VITALS — BP 142/50 | HR 61 | Temp 97.2°F | Wt 278.9 lb

## 2012-06-26 DIAGNOSIS — I4891 Unspecified atrial fibrillation: Secondary | ICD-10-CM

## 2012-06-26 DIAGNOSIS — Z7901 Long term (current) use of anticoagulants: Secondary | ICD-10-CM

## 2012-06-26 DIAGNOSIS — I350 Nonrheumatic aortic (valve) stenosis: Secondary | ICD-10-CM

## 2012-06-26 DIAGNOSIS — E119 Type 2 diabetes mellitus without complications: Secondary | ICD-10-CM

## 2012-06-26 DIAGNOSIS — I509 Heart failure, unspecified: Secondary | ICD-10-CM

## 2012-06-26 DIAGNOSIS — E039 Hypothyroidism, unspecified: Secondary | ICD-10-CM

## 2012-06-26 DIAGNOSIS — I359 Nonrheumatic aortic valve disorder, unspecified: Secondary | ICD-10-CM

## 2012-06-26 DIAGNOSIS — I5033 Acute on chronic diastolic (congestive) heart failure: Secondary | ICD-10-CM

## 2012-06-26 LAB — BASIC METABOLIC PANEL WITH GFR
BUN: 27 mg/dL — ABNORMAL HIGH (ref 6–23)
CO2: 35 mEq/L — ABNORMAL HIGH (ref 19–32)
Calcium: 10.4 mg/dL (ref 8.4–10.5)
Creat: 1.18 mg/dL — ABNORMAL HIGH (ref 0.50–1.10)
GFR, Est African American: 51 mL/min — ABNORMAL LOW

## 2012-06-26 LAB — GLUCOSE, CAPILLARY: Glucose-Capillary: 137 mg/dL — ABNORMAL HIGH (ref 70–99)

## 2012-06-26 NOTE — Assessment & Plan Note (Signed)
Patient has severe aortic stenosis. No murmur appreciated on exam. Her blood pressure is 142/50 today and she is mentating fine, no chest pain, no lightheadedness. Patient was evaluated by [facility] cardiology in the hospital and would benefit from cardiology followup.  -Refer to Cardiology to follow AS

## 2012-06-26 NOTE — Assessment & Plan Note (Addendum)
Heart rate in the 60s today. INR is 2.1. -Continue warfarin and followup with Dr. gross as scheduled

## 2012-06-26 NOTE — Assessment & Plan Note (Signed)
Patient appears euvolemic today. She does not have any crackles on pulmonary exam or lower extremity edema. No worsening shortness of breath or hemoptysis.  -Continue taking Lasix 40 mg twice daily -Check BMP today to monitor renal function and potassium -Followup in 1-2 months

## 2012-06-26 NOTE — Assessment & Plan Note (Signed)
A1c is 6.4 today, well-controlled. Continue current medications and followup with PCP.

## 2012-06-26 NOTE — Progress Notes (Signed)
Internal Medicine Clinic Visit    HPI:  [name] is a 79 y.o. year old female with an extensive medical history including chronic congestive heart failure, severe aortic stenosis, type 2 diabetes, morbid obesity. She comes to clinic for a hospital followup. She was discharged from her skilled nursing/rehabilitation facility today and is going back home.  Patient states that she is doing well, she feels like her breathing is at baseline. She denies any cough or sputum production. She has not had any dizziness, chest pain, weakness. Patient states that she is urinating without difficulty, normal and light yellow in color. She has bowel movements every 2-3 days.  She does have some back pain, which is chronic.  She has home health starting up again on Monday. She continues to have an aid at home to help her with mobility and her son helps her with her medications.   Past Medical History  Diagnosis Date  . Coronary atherosclerosis of native coronary artery     Mild at cateterization 2006  . Abdominal pain     Likely secondary to presbyesophagus and gastric dysmotility  . Chronic diastolic heart failure   . Respiratory failure, chronic     Mixed etiology with bronchospastic component  . Urinary incontinence     Recurrent uti's/resistance to cipro, bactrim  . Gout   . Postablative hypothyroidism     H/o Graves disease s/p radioactive iodine ablation with resultant postablative hypothyroidism  . Hyperlipidemia   . Depression   . Morbidly obese     s/p gastric plication surgery  . Gastroesophageal reflux disease   . Ventral hernia     Repair in April 2008, complicated by MRSA abdominal wall cellulitis  . DVT (deep venous thrombosis) 1998  . Pulmonary embolism 1998    Greenfield filter  . Diabetes mellitus type 2, controlled, with complications   . Essential hypertension, benign   . AF (atrial fibrillation)     on chronic warfarin  . Osteoarthritis   . Stroke 1997    Denies  residual  . Anemia     Blood transfusion [V58.2]  . Supratherapeutic INR 05/15/2012    Possibly induced by recent steroid use.     Past Surgical History  Procedure Laterality Date  . Cholecystectomy    . Appendectomy    . Breast biopsy    . Gastric bypass  1970's  . Hernia repair  05/2006    ventral hernia  . Greenfield filter placement  1998     ROS:  A complete review of systems was otherwise negative, except as noted in the HPI.  Allergies: Lantus and Nitrofurantoin  Medications: Current Outpatient Prescriptions  Medication Sig Dispense Refill  . acetaminophen (TYLENOL) 500 MG tablet Take 1,000 mg by mouth 2 (two) times daily as needed. For pain.      Marland Kitchen albuterol (PROVENTIL HFA) 108 (90 BASE) MCG/ACT inhaler Inhale 2 puffs into the lungs every 6 (six) hours as needed. For wheezing.  3.7 g  11  . allopurinol (ZYLOPRIM) 100 MG tablet Take 100 mg by mouth 2 (two) times daily.      Marland Kitchen amLODipine (NORVASC) 5 MG tablet Take 5 mg by mouth daily.      Marland Kitchen aspirin EC 81 MG EC tablet Take 1 tablet (81 mg total) by mouth daily.      Marland Kitchen atorvastatin (LIPITOR) 40 MG tablet Take 40 mg by mouth daily.      Marland Kitchen FLUoxetine (PROZAC) 20 MG capsule Take 20 mg  by mouth 2 (two) times daily.      . Fluticasone-Salmeterol (ADVAIR) 250-50 MCG/DOSE AEPB Inhale 1 puff into the lungs every 12 (twelve) hours.  60 each  3  . furosemide (LASIX) 40 MG tablet Take 1 tablet (40 mg total) by mouth 2 (two) times daily.      Marland Kitchen gabapentin (NEURONTIN) 100 MG capsule Take 100 mg by mouth 2 (two) times daily.      Marland Kitchen glipiZIDE (GLUCOTROL) 10 MG tablet Take 1 tablet (10 mg total) by mouth 2 (two) times daily before a meal.      . levothyroxine (SYNTHROID) 150 MCG tablet Take 1 tablet (150 mcg total) by mouth daily.  30 tablet  11  . loperamide (IMODIUM) 2 MG capsule Take 1 capsule (2 mg total) by mouth as needed for diarrhea or loose stools (up to total 16mg  daily).  8 capsule  0  . losartan (COZAAR) 50 MG tablet Take 50 mg  by mouth daily.      . montelukast (SINGULAIR) 10 MG tablet Take 10 mg by mouth at bedtime.      Marland Kitchen omeprazole (PRILOSEC) 20 MG capsule Take 40 mg by mouth 2 (two) times daily.      Marland Kitchen tiotropium (SPIRIVA HANDIHALER) 18 MCG inhalation capsule Place 1 capsule (18 mcg total) into inhaler and inhale daily.  30 capsule  4  . warfarin (COUMADIN) 5 MG tablet Take 5 mg by mouth daily.       No current facility-administered medications for this visit.    History   Social History  . Marital Status: Widowed    Spouse Name: N/A    Number of Children: N/A  . Years of Education: N/A   Occupational History  . Not on file.   Social History Main Topics  . Smoking status: Former Smoker -- 1.00 packs/day for 1.5 years    Types: Cigarettes  . Smokeless tobacco: Never Used  . Alcohol Use: No  . Drug Use: No  . Sexually Active: No   Other Topics Concern  . Not on file   Social History Narrative   She lives w/her son. Her son offers help and she has an aid who occasionally cooks for her and takes care of some other tasks around the pt's house.   Has medicare. Has son in [place] who is POA   Ambivalent about DNR issues and does not want a STOP sign posted in home          family history is negative for Colon cancer.  Physical Exam Blood pressure 142/50, pulse 61, temperature 97.2 F (36.2 C), temperature source Oral, weight 278 lb 14.4 oz (126.508 kg), last menstrual period 05/22/1968, SpO2 93.00%. General:  No acute distress, alert and oriented x 3, obese, well nourished, well hydrated HEENT:  PERRL, EOMI, no lymphadenopathy, moist mucous membranes Cardiovascular:  Rate in the 60s, S1-S2 heard, no murmur heard Respiratory:  Clear to auscultation bilaterally, no wheezes, rales, or rhonchi, good air movement Abdomen:  Soft, nondistended, nontender, normoactive bowel sounds Extremities:  Warm and well-perfused, no clubbing, cyanosis, trace edema  Skin: Warm, dry, no rashes Neuro: Not anxious  appearing, no depressed mood, normal affect  Labs: Lab Results  Component Value Date   CREATININE 1.05 05/27/2012   BUN 30* 05/27/2012   NA 135 05/27/2012   K 4.6 05/27/2012   CL 97 05/27/2012   CO2 27 05/27/2012   Lab Results  Component Value Date   WBC 8.4 05/24/2012  HGB 11.8* 05/24/2012   HCT 37.0 05/24/2012   MCV 90.9 05/24/2012   PLT 224 05/24/2012      Assessment and Plan:    FOLLOWUP: [name] will follow back up in our clinic in approximately one to 2 months, sooner if needed. [name] knows to call out clinic in the meantime with any questions or new issues.

## 2012-06-26 NOTE — Patient Instructions (Addendum)
General Instructions:  Thank you for coming in today! Please continue to take your medications as directed. You may make a follow up appointment with your PCP in 1-2 months.  Treatment Goals:  Goals (1 Years of Data) as of 06/26/12         As of Today 03/31/12 11/28/11 06/18/11     Result Component    . HEMOGLOBIN A1C < 8  6.4 7.0 7.5 7.6      Progress Toward Treatment Goals:  Treatment Goal 06/26/2012  Hemoglobin A1C at goal  Blood pressure unable to assess    Self Care Goals & Plans:  Self Care Goal 06/26/2012  Manage my medications take my medicines as prescribed; bring my medications to every visit; refill my medications on time  Monitor my health keep track of my weight  Eat healthy foods eat foods that are low in salt; eat baked foods instead of fried foods  Be physically active find an activity I enjoy

## 2012-06-26 NOTE — Assessment & Plan Note (Signed)
Check TSH today Continue Synthroid at current dose for now

## 2012-06-27 ENCOUNTER — Other Ambulatory Visit: Payer: Self-pay | Admitting: Internal Medicine

## 2012-06-27 LAB — TSH: TSH: 4.144 u[IU]/mL (ref 0.350–4.500)

## 2012-07-01 ENCOUNTER — Telehealth: Payer: Self-pay | Admitting: *Deleted

## 2012-07-01 NOTE — Telephone Encounter (Signed)
Call from [facility].  PT- 39.3 and INR- 3.3;stated pt is taking Coumadin 5mg .

## 2012-07-01 NOTE — Telephone Encounter (Signed)
Call from [place], Ozella Almond Nurse, wanting to know if pt's doctor wants another PT/INR done. States she on her way to see pt this am. Last PT/INR was 5/2.  Dr Dalphine Handing paged. Stated to draw PT/INR. I called Bethann Berkshire back - verbal order given to draw lab.

## 2012-07-01 NOTE — Telephone Encounter (Signed)
Pt and Adv home Nurse,Trisha, instructed to take Coumadin 2.5mg  x 2 days (today and tomorrow, pt stated she has not taken med today yet)  then resume 5mg  and to check INR in 1 week per Dr Dalphine Handing. Both understood instructions.

## 2012-07-01 NOTE — Telephone Encounter (Signed)
Please ask the patient to take 2.5 mg for the next two days and then resume 5 mg. INR check in 1 week. Thanks.

## 2012-07-01 NOTE — Telephone Encounter (Signed)
Thank you :)

## 2012-07-02 NOTE — Progress Notes (Signed)
Case discussed with Dr. Kesty immediately after the resident saw the patient. We reviewed the resident's history and exam and pertinent patient test results. I agree with the assessment, diagnosis and plan of care documented in the resident's note. 

## 2012-07-14 ENCOUNTER — Other Ambulatory Visit: Payer: Self-pay | Admitting: Internal Medicine

## 2012-07-16 ENCOUNTER — Other Ambulatory Visit: Payer: Self-pay | Admitting: Internal Medicine

## 2012-07-20 ENCOUNTER — Other Ambulatory Visit: Payer: Self-pay | Admitting: Internal Medicine

## 2012-07-21 ENCOUNTER — Other Ambulatory Visit: Payer: Self-pay | Admitting: *Deleted

## 2012-07-22 ENCOUNTER — Telehealth: Payer: Self-pay | Admitting: *Deleted

## 2012-07-22 MED ORDER — FUROSEMIDE 40 MG PO TABS: 40.0000 mg | ORAL_TABLET | Freq: Two times a day (BID) | ORAL | Status: DC

## 2012-07-22 NOTE — Telephone Encounter (Signed)
Yes please

## 2012-07-22 NOTE — Telephone Encounter (Signed)
[facility] calls for order for pt/ inr, may we give it? Will need to be ordered

## 2012-07-23 ENCOUNTER — Telehealth: Payer: Self-pay | Admitting: *Deleted

## 2012-07-23 NOTE — Telephone Encounter (Signed)
Call from Nurse with [facility] reporting labs from [facility] labs PT 32.9 and   INR 3.43  Please advise. Pt is taking Coumadin 5 mg daily.  Unable to reach Dr Alexandria Lodge, please advise

## 2012-07-23 NOTE — Telephone Encounter (Signed)
i have spoken to [name], she is on her way to pt's home and will call within the hr with pt/inr and orders for future

## 2012-07-23 NOTE — Telephone Encounter (Signed)
Thanks. Please let Dr. Alexandria Lodge know.

## 2012-07-23 NOTE — Telephone Encounter (Signed)
An INR of 3.43 is not very far from the ideal 2-3 at this time. Patients are in range only 60% of the times in best of the coumadin clinics and a mild variation is expected from the nature of being on coumadin.  No urgent changes need to be made to the medication list and patient can continue to take her Coumadin as prescribed by Dr. Alexandria Lodge. We should forward the results to Dr Alexandria Lodge and wait for him to check the results before making any changes.

## 2012-07-23 NOTE — Telephone Encounter (Signed)
[facility] advised, will forward to Dr Alexandria Lodge and ask when next INR is due.

## 2012-07-23 NOTE — Telephone Encounter (Signed)
Call from [facility] with  [facility] _ # [phone] Nurse called stating she did a  INR  Using coag machine and results were 4.3 . Pt takes Coumadin 5 mg a day and baby asa. Per protocol a vena puncture was done and brought to lab to run stat report.  Will wait for results.

## 2012-07-25 NOTE — Telephone Encounter (Signed)
Thanks

## 2012-07-26 DIAGNOSIS — I350 Nonrheumatic aortic (valve) stenosis: Secondary | ICD-10-CM

## 2012-07-26 HISTORY — DX: Nonrheumatic aortic (valve) stenosis: I35.0

## 2012-07-27 ENCOUNTER — Other Ambulatory Visit: Payer: Self-pay | Admitting: Internal Medicine

## 2012-07-27 ENCOUNTER — Telehealth: Payer: Self-pay | Admitting: Pharmacist

## 2012-07-27 NOTE — Telephone Encounter (Signed)
New orders given per Dr Alexandria Lodge.  Pt to take coumadin 5 mg every day but take 2 1/2 mg on mon and thur. Next INR June 23, RN informed and she will call patient.

## 2012-07-27 NOTE — Telephone Encounter (Signed)
Contacted by Inocencio Homes, Triage Nurse in [facility] regarding a [facility] collected PT/INR on patient on 29-MAY-14 which was 3.4 on 35mg  warfarin weekly (as 1x5mg  tablet daily). Dose was adjusted down to 2.5mg  on Monday/Thursday; 5mg  al other days. [facility] RN to re-collect INR on 23-JUN-14 and phone results to [place]. There were no reports of bleeding or thrombotic events as part of this communication by [facility] RN to [place].

## 2012-07-28 ENCOUNTER — Ambulatory Visit (INDEPENDENT_AMBULATORY_CARE_PROVIDER_SITE_OTHER): Payer: Medicare PPO | Admitting: Internal Medicine

## 2012-07-28 ENCOUNTER — Encounter: Payer: Self-pay | Admitting: Internal Medicine

## 2012-07-28 VITALS — BP 132/52 | HR 59 | Temp 96.6°F

## 2012-07-28 DIAGNOSIS — I5033 Acute on chronic diastolic (congestive) heart failure: Secondary | ICD-10-CM

## 2012-07-28 DIAGNOSIS — N390 Urinary tract infection, site not specified: Secondary | ICD-10-CM

## 2012-07-28 DIAGNOSIS — E1142 Type 2 diabetes mellitus with diabetic polyneuropathy: Secondary | ICD-10-CM

## 2012-07-28 DIAGNOSIS — I5032 Chronic diastolic (congestive) heart failure: Secondary | ICD-10-CM

## 2012-07-28 DIAGNOSIS — E1149 Type 2 diabetes mellitus with other diabetic neurological complication: Secondary | ICD-10-CM

## 2012-07-28 DIAGNOSIS — I509 Heart failure, unspecified: Secondary | ICD-10-CM

## 2012-07-28 DIAGNOSIS — Z7901 Long term (current) use of anticoagulants: Secondary | ICD-10-CM

## 2012-07-28 DIAGNOSIS — E039 Hypothyroidism, unspecified: Secondary | ICD-10-CM

## 2012-07-28 DIAGNOSIS — I359 Nonrheumatic aortic valve disorder, unspecified: Secondary | ICD-10-CM

## 2012-07-28 DIAGNOSIS — N189 Chronic kidney disease, unspecified: Secondary | ICD-10-CM

## 2012-07-28 DIAGNOSIS — I4891 Unspecified atrial fibrillation: Secondary | ICD-10-CM

## 2012-07-28 DIAGNOSIS — I350 Nonrheumatic aortic (valve) stenosis: Secondary | ICD-10-CM

## 2012-07-28 LAB — COMPLETE METABOLIC PANEL WITH GFR
ALT: 17 U/L (ref 0–35)
AST: 20 U/L (ref 0–37)
Albumin: 3.6 g/dL (ref 3.5–5.2)
Alkaline Phosphatase: 121 U/L — ABNORMAL HIGH (ref 39–117)
BUN: 35 mg/dL — ABNORMAL HIGH (ref 6–23)
CO2: 33 mEq/L — ABNORMAL HIGH (ref 19–32)
Calcium: 10.3 mg/dL (ref 8.4–10.5)
Chloride: 96 mEq/L (ref 96–112)
Creat: 1.1 mg/dL (ref 0.50–1.10)
GFR, Est African American: 56 mL/min — ABNORMAL LOW
GFR, Est Non African American: 48 mL/min — ABNORMAL LOW
Glucose, Bld: 128 mg/dL — ABNORMAL HIGH (ref 70–99)
Potassium: 4.5 mEq/L (ref 3.5–5.3)
Sodium: 138 mEq/L (ref 135–145)
Total Bilirubin: 0.5 mg/dL (ref 0.3–1.2)
Total Protein: 7.2 g/dL (ref 6.0–8.3)

## 2012-07-28 LAB — CBC WITH DIFFERENTIAL/PLATELET
Basophils Absolute: 0 10*3/uL (ref 0.0–0.1)
Eosinophils Relative: 2 % (ref 0–5)
Lymphocytes Relative: 18 % (ref 12–46)
Lymphs Abs: 1.2 10*3/uL (ref 0.7–4.0)
Neutro Abs: 4.9 10*3/uL (ref 1.7–7.7)
Neutrophils Relative %: 73 % (ref 43–77)
Platelets: 294 10*3/uL (ref 150–400)
RBC: 3.71 MIL/uL — ABNORMAL LOW (ref 3.87–5.11)
RDW: 16.3 % — ABNORMAL HIGH (ref 11.5–15.5)
WBC: 6.8 10*3/uL (ref 4.0–10.5)

## 2012-07-28 MED ORDER — CEPHALEXIN 500 MG PO CAPS: 500.0000 mg | ORAL_CAPSULE | Freq: Two times a day (BID) | ORAL | Status: AC

## 2012-07-28 NOTE — Progress Notes (Signed)
Subjective:     Patient ID: [name], female   DOB: August 19, 1932, 79 y.o.   MRN: [id number]  HPI [name], 79 year old white lady with severe aortic stenosis, frequent admissions with congestive heart failure, hypothyroidism, morbid obesity, DM2, come for follow up visit. She is sitting in a wheelchair wearing 2L of oxygen. Initially sats down to 89%, repeat 83%. Can speak full sentences. We got our oxygen tank from the clinic and her sats increased to 95% and she seemed more comfortable.  She has complaints of burning in urine, incomplete evacuation of bladder. No fever, chills. She is incontinent at baseline. Wears pads and will not be able to provide [place] with a urine sample. She has no fever or chills. She complains of back pain but has no flank or abdominal pain.   She is also very confused about her medications. She has brought a big plastic bag with her, with more than 20 pill bottles in them. She has a lot of duplicate medicines, which I sorted out for her. I spent more than 30 minutes straightening out her medicines and explaining her which ones to take. I have manually written the list of her medications down below.   She has mild gouty pains in her ankles. She says she has been forbidden to take colchicine or prednisone for gout because of her kidney failure. She is taking extra strength tylenol for it and has some benefit.   For diabetes, she is taking Glipizide 10 mg daily and her most recent A1c today is 6.4, down from 7.0 last time in February 2014. She reports compliance. She does not measure her sugars daily, but says that she does feel lightheaded and palpitations at times. I am not sure if this is due to hypoglycemia for sure, as this could be multifactorial in the setting of AS, & CHF .    Home Medications  Lasix 40 mg in the morning 20 mg at night Synthroid 150 microg daily Allopurinol 100mg  daily Colchicine 0.6 mg as needed for gout Atorvastatin 40 mg  daily Omeprazole 20 mg daily Glipizide 10 mg daily Warfarin as per Dr. Alexandria Lodge Fluoxetine 20 mg daily  Gabapentin 100 mg twice daily Singulair 10 mg daily Spiriva 1 capsule daily Advair 250-50 daily  Review of Systems  Constitutional: Positive for activity change. Negative for fever, chills, diaphoresis, appetite change, fatigue and unexpected weight change.  HENT: Negative for facial swelling, neck pain and neck stiffness.   Eyes: Negative for visual disturbance.  Respiratory: Positive for shortness of breath. Negative for apnea, choking and chest tightness.   Cardiovascular: Negative for chest pain, palpitations and leg swelling.  Gastrointestinal: Negative for vomiting, diarrhea, constipation and abdominal distention.  Endocrine: Negative for cold intolerance, heat intolerance, polydipsia, polyphagia and polyuria.  Genitourinary: Positive for dysuria, frequency and difficulty urinating. Negative for flank pain and vaginal discharge.  Neurological: Negative.   Psychiatric/Behavioral: Negative.        Objective:   Physical Exam Morbid obesity, sitting in a wheelchair, huffs and puffs sometimes, wears 2L oxygen. PERRL, EOMI, Neck supple, JVD could not be seen  Heart - muffled heart sounds, Systolic murmur loud Lungs - Bibasilar rales Abdomen - Central well healing scar, with a small non-healing area in the middle.  Extremities - Mild pedal pitting edema, mild ankle edema     Assessment:      Medication management - I spent more than 35 minutes with the patient sorting out her medications. She  had a big bag full of prescription bottles, some of which were duplicate and some expired. The home health aide with her does not seem to be much help in this regard and says that the son takes care of her medications. I went over the medications with her several times, and asked her to repeat after me about them. I hope she has better understanding of this now.   Suspected UTI - She has  grown pan-sensitive EColi before and I would empirically treat her for the same with Cephalexin 500 mg BID for 7 days. Her CrCl is approximately 58 if we adjust for height and morbid obesity. I called her to let her know of this today (07/29/12 @ 1052).    Possible hypoglycemia - Cut back on glipizide to 5 mg.    Gout flares - Currently has mildly swollen ankles. Ok to take Prednisone 5 mg for Gout for 3 days. Then stop. Okay to take colchicine 0.6 mg for 1-2 days for acute flares. Ok to continue with Allopurinol.   Aortic Stenosis; Congestive Heart Failure - being followed by [facility]. Will obtain notes. Her next visit is in 3 months in August. She is a poor candidate for AS surgery, and she is unwilling for the surgery as well, as far as I have talked to her on this issue.    CKD - CBC, CMP done today. Kidney function better.   Results for [name], [name] (MRN [id number]) as of 07/28/2012 14:02  05/26/2012 04:35 05/27/2012 05:25 06/26/2012 16:23 07/28/2012 12:31  Creatinine 1.20 (H) 1.05 1.18 (H) 1.10     Atrial Fibrillation rate controlled, on coumadin managed by Dr. Alexandria Lodge.   CHF - She did have hypoxia initially, which turned out to be because of her portable oxygen cylinder which perhaps is not as good as the bigger, stationary cylinder she has at home. She has been instructed to be hooked to her bigger cylinder that she has at home as much as possible and get the portable cylinders replaced from the company.  We will keep lasix per cardiology. We could not measure the patient's weight this visit as it was extremely difficult to pick her up and put her on the weighing machine. The patient is unable to stand because of pain in her ankles.    Hypothyroidism - TSH last checked 06/26/12 - within range. Patient compliant with 150 microg of Synthroid.    Hypertension - She is well controlled on Losartan and Norvasc.  BP Readings from Last 3 Encounters:  07/28/12 132/52  06/26/12 142/50   05/27/12 125/66   1 month follow up, or earlier if needed.

## 2012-07-28 NOTE — Patient Instructions (Addendum)
When you are having gout flare, its okay to take Prednisone 5 mg daily for 2-3 days. Do not take it for long periods of time.   Please decrease GLIPIZIDE to 5 mg daily.   We are doing blood work today, I will call you with results.  I will prescribe a medication for your urinary tract infection after seeing your blood work.  You had low oxygen on the smaller portable oxygen tank. Please stay hooked to the larger oxygen tank at your home as much as possible.   Please come back in 1 month to see me.   Thanks, Aletta Edouard MD MPH 07/28/2012 12:05 PM

## 2012-07-30 ENCOUNTER — Telehealth: Payer: Self-pay | Admitting: *Deleted

## 2012-07-30 NOTE — Telephone Encounter (Signed)
Call from [place], RN with [facility] _ # [phone] She is seeing pt and was told pt started on Keflex during visit yesterday.  Pt is on Coumadin and her orders are to check INR on 6/23. Do you want the INR checked sooner?

## 2012-07-30 NOTE — Telephone Encounter (Signed)
Please take Dr. Saralyn Pilar advice on that. He manages Coumadin in this patient.

## 2012-07-31 NOTE — Telephone Encounter (Signed)
I have paged Dr Alexandria Lodge yesterday and today.  No response. Please advise on repeat INR

## 2012-07-31 NOTE — Telephone Encounter (Signed)
[facility] will collect INR on Monday and call the report.

## 2012-07-31 NOTE — Telephone Encounter (Signed)
Have this patient come in on Monday to have their INR checked, thanks.

## 2012-08-03 ENCOUNTER — Telehealth: Payer: Self-pay | Admitting: *Deleted

## 2012-08-03 NOTE — Telephone Encounter (Signed)
Call from [place], RN with [facility] [phone] She reports INR today is 4.8 Pt is taking coumadin 2.5 mg mon and thur, and 5 mg the other days. Pt is still taking keflex 500 mg bid, will stop tomorrow.   I talked with Dr Alexandria Lodge and he wants pt to hold coumadin today, take 2.5 mg  On Tue,thur, & sat. Take 5 mg on Wed, Fri and Sun. Recheck INT on Monday 6/16.  [facility] informed and will instruct pt.

## 2012-08-03 NOTE — Telephone Encounter (Signed)
Called by [facility] RN Trish with INR 4.8 while on cephalexin 500mg  PO BID--last dose today the RN states. RN endorses no bleeding complications observed while with patient. RN was advised to instruct patient to HOLD/OMIT todays dose of warfarin. Tomorrow, resume warfarin by taking 1/2 x 5mg  tablet; WHOLE tablet (5mg ) days will be SUNDAYS; WEDNESDAYS; and FRIDAYS. All other days--1/2 x 5mg  (2.5mg ). Next INR on Monday June 16--to be called to me.

## 2012-08-04 NOTE — Telephone Encounter (Signed)
Agree with Dr. Alexandria Lodge, thanks.

## 2012-08-06 ENCOUNTER — Encounter (HOSPITAL_COMMUNITY): Payer: Self-pay

## 2012-08-06 ENCOUNTER — Inpatient Hospital Stay (HOSPITAL_COMMUNITY)
Admission: EM | Admit: 2012-08-06 | Discharge: 2012-08-08 | DRG: 313 | Disposition: A | Discharge status: Home / Self Care | Payer: Medicare PPO | Attending: Internal Medicine | Admitting: Internal Medicine

## 2012-08-06 ENCOUNTER — Emergency Department (HOSPITAL_COMMUNITY): Payer: Medicare PPO

## 2012-08-06 DIAGNOSIS — J449 Chronic obstructive pulmonary disease, unspecified: Secondary | ICD-10-CM | POA: Diagnosis present

## 2012-08-06 DIAGNOSIS — Z86718 Personal history of other venous thrombosis and embolism: Secondary | ICD-10-CM

## 2012-08-06 DIAGNOSIS — I252 Old myocardial infarction: Secondary | ICD-10-CM

## 2012-08-06 DIAGNOSIS — I5033 Acute on chronic diastolic (congestive) heart failure: Secondary | ICD-10-CM

## 2012-08-06 DIAGNOSIS — I130 Hypertensive heart and chronic kidney disease with heart failure and stage 1 through stage 4 chronic kidney disease, or unspecified chronic kidney disease: Secondary | ICD-10-CM | POA: Diagnosis present

## 2012-08-06 DIAGNOSIS — K219 Gastro-esophageal reflux disease without esophagitis: Secondary | ICD-10-CM | POA: Diagnosis present

## 2012-08-06 DIAGNOSIS — R079 Chest pain, unspecified: Secondary | ICD-10-CM | POA: Diagnosis present

## 2012-08-06 DIAGNOSIS — E1129 Type 2 diabetes mellitus with other diabetic kidney complication: Secondary | ICD-10-CM | POA: Diagnosis present

## 2012-08-06 DIAGNOSIS — Z6841 Body Mass Index (BMI) 40.0 and over, adult: Secondary | ICD-10-CM

## 2012-08-06 DIAGNOSIS — I2510 Atherosclerotic heart disease of native coronary artery without angina pectoris: Secondary | ICD-10-CM | POA: Diagnosis present

## 2012-08-06 DIAGNOSIS — Z9981 Dependence on supplemental oxygen: Secondary | ICD-10-CM

## 2012-08-06 DIAGNOSIS — N183 Chronic kidney disease, stage 3 unspecified: Secondary | ICD-10-CM | POA: Diagnosis present

## 2012-08-06 DIAGNOSIS — R0789 Other chest pain: Principal | ICD-10-CM | POA: Diagnosis present

## 2012-08-06 DIAGNOSIS — D649 Anemia, unspecified: Secondary | ICD-10-CM

## 2012-08-06 DIAGNOSIS — I498 Other specified cardiac arrhythmias: Secondary | ICD-10-CM | POA: Diagnosis present

## 2012-08-06 DIAGNOSIS — I5023 Acute on chronic systolic (congestive) heart failure: Secondary | ICD-10-CM | POA: Diagnosis present

## 2012-08-06 DIAGNOSIS — Z8673 Personal history of transient ischemic attack (TIA), and cerebral infarction without residual deficits: Secondary | ICD-10-CM

## 2012-08-06 DIAGNOSIS — F3289 Other specified depressive episodes: Secondary | ICD-10-CM | POA: Diagnosis present

## 2012-08-06 DIAGNOSIS — R001 Bradycardia, unspecified: Secondary | ICD-10-CM

## 2012-08-06 DIAGNOSIS — N390 Urinary tract infection, site not specified: Secondary | ICD-10-CM | POA: Diagnosis present

## 2012-08-06 DIAGNOSIS — Z86711 Personal history of pulmonary embolism: Secondary | ICD-10-CM

## 2012-08-06 DIAGNOSIS — E890 Postprocedural hypothyroidism: Secondary | ICD-10-CM | POA: Diagnosis present

## 2012-08-06 DIAGNOSIS — I509 Heart failure, unspecified: Secondary | ICD-10-CM

## 2012-08-06 DIAGNOSIS — J9610 Chronic respiratory failure, unspecified whether with hypoxia or hypercapnia: Secondary | ICD-10-CM | POA: Diagnosis present

## 2012-08-06 DIAGNOSIS — I10 Essential (primary) hypertension: Secondary | ICD-10-CM | POA: Diagnosis present

## 2012-08-06 DIAGNOSIS — I359 Nonrheumatic aortic valve disorder, unspecified: Secondary | ICD-10-CM | POA: Diagnosis present

## 2012-08-06 DIAGNOSIS — I4891 Unspecified atrial fibrillation: Secondary | ICD-10-CM | POA: Diagnosis present

## 2012-08-06 DIAGNOSIS — F329 Major depressive disorder, single episode, unspecified: Secondary | ICD-10-CM | POA: Diagnosis present

## 2012-08-06 DIAGNOSIS — E119 Type 2 diabetes mellitus without complications: Secondary | ICD-10-CM | POA: Diagnosis present

## 2012-08-06 DIAGNOSIS — I350 Nonrheumatic aortic (valve) stenosis: Secondary | ICD-10-CM

## 2012-08-06 DIAGNOSIS — E039 Hypothyroidism, unspecified: Secondary | ICD-10-CM | POA: Diagnosis present

## 2012-08-06 DIAGNOSIS — Z7901 Long term (current) use of anticoagulants: Secondary | ICD-10-CM

## 2012-08-06 DIAGNOSIS — J4489 Other specified chronic obstructive pulmonary disease: Secondary | ICD-10-CM | POA: Diagnosis present

## 2012-08-06 DIAGNOSIS — Z87891 Personal history of nicotine dependence: Secondary | ICD-10-CM

## 2012-08-06 DIAGNOSIS — E785 Hyperlipidemia, unspecified: Secondary | ICD-10-CM | POA: Diagnosis present

## 2012-08-06 DIAGNOSIS — N189 Chronic kidney disease, unspecified: Secondary | ICD-10-CM | POA: Diagnosis present

## 2012-08-06 DIAGNOSIS — M109 Gout, unspecified: Secondary | ICD-10-CM | POA: Diagnosis present

## 2012-08-06 DIAGNOSIS — N289 Disorder of kidney and ureter, unspecified: Secondary | ICD-10-CM

## 2012-08-06 HISTORY — DX: Chest pain, unspecified: R07.9

## 2012-08-06 LAB — BASIC METABOLIC PANEL
BUN: 36 mg/dL — ABNORMAL HIGH (ref 6–23)
BUN: 34 mg/dL — ABNORMAL HIGH (ref 6–23)
CO2: 34 mEq/L — ABNORMAL HIGH (ref 19–32)
CO2: 36 mEq/L — ABNORMAL HIGH (ref 19–32)
Calcium: 10.2 mg/dL (ref 8.4–10.5)
Chloride: 96 mEq/L (ref 96–112)
Creatinine, Ser: 1.21 mg/dL — ABNORMAL HIGH (ref 0.50–1.10)
Creatinine, Ser: 1.19 mg/dL — ABNORMAL HIGH (ref 0.50–1.10)
GFR calc non Af Amer: 42 mL/min — ABNORMAL LOW (ref >90)
Glucose, Bld: 137 mg/dL — ABNORMAL HIGH (ref 70–99)
Sodium: 136 mEq/L (ref 135–145)

## 2012-08-06 LAB — GLUCOSE, CAPILLARY: Glucose-Capillary: 150 mg/dL — ABNORMAL HIGH (ref 70–99)

## 2012-08-06 LAB — HEPATIC FUNCTION PANEL
ALT: 19 U/L (ref 0–35)
AST: 20 U/L (ref 0–37)
Bilirubin, Direct: 0.1 mg/dL (ref 0.0–0.3)
Total Bilirubin: 0.5 mg/dL (ref 0.3–1.2)

## 2012-08-06 LAB — CBC
HCT: 32.1 % — ABNORMAL LOW (ref 36.0–46.0)
HCT: 32.2 % — ABNORMAL LOW (ref 36.0–46.0)
Hemoglobin: 9.9 g/dL — ABNORMAL LOW (ref 12.0–15.0)
Hemoglobin: 10 g/dL — ABNORMAL LOW (ref 12.0–15.0)
MCHC: 30.8 g/dL (ref 30.0–36.0)
MCV: 89.2 fL (ref 78.0–100.0)
RDW: 16.3 % — ABNORMAL HIGH (ref 11.5–15.5)
WBC: 8.1 10*3/uL (ref 4.0–10.5)
WBC: 6.4 10*3/uL (ref 4.0–10.5)

## 2012-08-06 LAB — PROTIME-INR: Prothrombin Time: 28.3 seconds — ABNORMAL HIGH (ref 11.6–15.2)

## 2012-08-06 LAB — TROPONIN I: Troponin I: <0.3 ng/mL (ref <0.30)

## 2012-08-06 LAB — POCT I-STAT TROPONIN I

## 2012-08-06 MED ORDER — TIOTROPIUM BROMIDE MONOHYDRATE 18 MCG IN CAPS
18.0000 ug | ORAL_CAPSULE | Freq: Every day | RESPIRATORY_TRACT | Status: DC
  Administered 2012-08-06 – 2012-08-08 (×3): 18 ug via RESPIRATORY_TRACT
  Filled 2012-08-06: qty 5

## 2012-08-06 MED ORDER — SODIUM CHLORIDE 0.9 % IJ SOLN: 3.0000 mL | INTRAMUSCULAR | Status: DC | PRN

## 2012-08-06 MED ORDER — CEPHALEXIN 500 MG PO CAPS
500.0000 mg | ORAL_CAPSULE | Freq: Two times a day (BID) | ORAL | Status: DC
  Administered 2012-08-06 – 2012-08-08 (×5): 500 mg via ORAL
  Filled 2012-08-06 (×6): qty 1

## 2012-08-06 MED ORDER — AMLODIPINE BESYLATE 5 MG PO TABS
5.0000 mg | ORAL_TABLET | Freq: Every day | ORAL | Status: DC
  Administered 2012-08-06 – 2012-08-08 (×3): 5 mg via ORAL
  Filled 2012-08-06 (×3): qty 1

## 2012-08-06 MED ORDER — ACETAMINOPHEN 325 MG PO TABS: 650.0000 mg | ORAL_TABLET | ORAL | Status: DC | PRN

## 2012-08-06 MED ORDER — NITROGLYCERIN 0.4 MG SL SUBL: 0.4000 mg | SUBLINGUAL_TABLET | SUBLINGUAL | Status: DC | PRN

## 2012-08-06 MED ORDER — FLUOXETINE HCL 20 MG PO CAPS
20.0000 mg | ORAL_CAPSULE | Freq: Two times a day (BID) | ORAL | Status: DC
  Administered 2012-08-06 – 2012-08-08 (×5): 20 mg via ORAL
  Filled 2012-08-06 (×6): qty 1

## 2012-08-06 MED ORDER — PANTOPRAZOLE SODIUM 40 MG PO TBEC
40.0000 mg | DELAYED_RELEASE_TABLET | Freq: Every day | ORAL | Status: DC
  Administered 2012-08-06 – 2012-08-08 (×3): 40 mg via ORAL
  Filled 2012-08-06 (×4): qty 1

## 2012-08-06 MED ORDER — NITROGLYCERIN 0.4 MG SL SUBL
0.4000 mg | SUBLINGUAL_TABLET | SUBLINGUAL | Status: DC | PRN
  Administered 2012-08-06: 0.4 mg via SUBLINGUAL
  Filled 2012-08-06: qty 25

## 2012-08-06 MED ORDER — SODIUM CHLORIDE 0.9 % IJ SOLN
3.0000 mL | Freq: Two times a day (BID) | INTRAMUSCULAR | Status: DC
  Administered 2012-08-06 – 2012-08-08 (×5): 3 mL via INTRAVENOUS

## 2012-08-06 MED ORDER — WARFARIN SODIUM 5 MG PO TABS
5.0000 mg | ORAL_TABLET | Freq: Every day | ORAL | Status: DC
  Administered 2012-08-06: 5 mg via ORAL
  Filled 2012-08-06 (×2): qty 1

## 2012-08-06 MED ORDER — ASPIRIN EC 81 MG PO TBEC
81.0000 mg | DELAYED_RELEASE_TABLET | Freq: Every day | ORAL | Status: DC
  Administered 2012-08-06 – 2012-08-08 (×3): 81 mg via ORAL
  Filled 2012-08-06 (×3): qty 1

## 2012-08-06 MED ORDER — ASPIRIN 81 MG PO CHEW: 324.0000 mg | CHEWABLE_TABLET | Freq: Once | ORAL | Status: AC

## 2012-08-06 MED ORDER — FUROSEMIDE 10 MG/ML IJ SOLN
40.0000 mg | Freq: Once | INTRAMUSCULAR | Status: AC
  Administered 2012-08-06: 40 mg via INTRAVENOUS
  Filled 2012-08-06: qty 4

## 2012-08-06 MED ORDER — ALBUTEROL SULFATE HFA 108 (90 BASE) MCG/ACT IN AERS: 2.0000 | INHALATION_SPRAY | Freq: Four times a day (QID) | RESPIRATORY_TRACT | Status: DC | PRN

## 2012-08-06 MED ORDER — FUROSEMIDE 10 MG/ML IJ SOLN
40.0000 mg | Freq: Three times a day (TID) | INTRAMUSCULAR | Status: DC
  Administered 2012-08-06 – 2012-08-08 (×9): 40 mg via INTRAVENOUS
  Filled 2012-08-06 (×9): qty 4

## 2012-08-06 MED ORDER — ONDANSETRON HCL 4 MG/2ML IJ SOLN: 4.0000 mg | Freq: Four times a day (QID) | INTRAMUSCULAR | Status: DC | PRN

## 2012-08-06 MED ORDER — LOSARTAN POTASSIUM 50 MG PO TABS
50.0000 mg | ORAL_TABLET | Freq: Every day | ORAL | Status: DC
Start: 2012-08-06 — End: 2012-08-08
  Administered 2012-08-06 – 2012-08-08 (×3): 50 mg via ORAL
  Filled 2012-08-06 (×3): qty 1

## 2012-08-06 MED ORDER — SODIUM CHLORIDE 0.9 % IV SOLN: 250.0000 mL | INTRAVENOUS | Status: DC | PRN

## 2012-08-06 MED ORDER — LEVOTHYROXINE SODIUM 150 MCG PO TABS
150.0000 ug | ORAL_TABLET | Freq: Every day | ORAL | Status: DC
  Administered 2012-08-06 – 2012-08-08 (×3): 150 ug via ORAL
  Filled 2012-08-06 (×5): qty 1

## 2012-08-06 MED ORDER — INSULIN ASPART 100 UNIT/ML ~~LOC~~ SOLN
0.0000 [IU] | Freq: Three times a day (TID) | SUBCUTANEOUS | Status: DC
  Administered 2012-08-06: 3 [IU] via SUBCUTANEOUS
  Administered 2012-08-07: 2 [IU] via SUBCUTANEOUS
  Administered 2012-08-07: 3 [IU] via SUBCUTANEOUS
  Administered 2012-08-08: 2 [IU] via SUBCUTANEOUS
  Administered 2012-08-08: 3 [IU] via SUBCUTANEOUS
  Administered 2012-08-08: 2 [IU] via SUBCUTANEOUS

## 2012-08-06 MED ORDER — MOMETASONE FURO-FORMOTEROL FUM 100-5 MCG/ACT IN AERO
2.0000 | INHALATION_SPRAY | Freq: Two times a day (BID) | RESPIRATORY_TRACT | Status: DC
  Administered 2012-08-06 – 2012-08-08 (×5): 2 via RESPIRATORY_TRACT
  Filled 2012-08-06: qty 8.8

## 2012-08-06 MED ORDER — ASPIRIN 325 MG PO TABS: 325.0000 mg | ORAL_TABLET | ORAL | Status: DC

## 2012-08-06 MED ORDER — ATORVASTATIN CALCIUM 40 MG PO TABS
40.0000 mg | ORAL_TABLET | Freq: Every day | ORAL | Status: DC
  Administered 2012-08-06 – 2012-08-08 (×3): 40 mg via ORAL
  Filled 2012-08-06 (×3): qty 1

## 2012-08-06 MED ORDER — WARFARIN - PHARMACIST DOSING INPATIENT: Freq: Every day | Status: DC

## 2012-08-06 MED ORDER — GABAPENTIN 100 MG PO CAPS
100.0000 mg | ORAL_CAPSULE | Freq: Two times a day (BID) | ORAL | Status: DC
  Administered 2012-08-06 – 2012-08-08 (×5): 100 mg via ORAL
  Filled 2012-08-06 (×6): qty 1

## 2012-08-06 NOTE — H&P (Signed)
Date: 08/06/2012               Patient Name:  [name] MRN: [id number]  DOB: Oct 30, 1932 Age / Sex: 79 y.o., female   PCP: [name] Edouard, MD              Medical Service: Internal Medicine Teaching Service              Attending Physician: Dr. Margarito Liner, MD    First Contact: Dr. Collier Bullock Pager: [phone]  Second Contact: Dr. Dorise Hiss Pager: [phone]            After Hours (After 5p/  First Contact Pager: [phone]  weekends / holidays): Second Contact Pager: [phone]   Chief Complaint: chest pain   History of Present Illness: [name] is a 79 year old woman with PMH of DM2, hyperlipidemia, HTN, A. Fib on warfarin, history of PE, CHF (EF of 55% per 2D echo on 04/21/12 ), CAD (MI 20 yrs ago, cath in 2006 with nonobstructive disease, 30-40% stenosis on circumflex), severe aortic stenosis (mild regurgitation, 0.57cm^2 area per last 2D echo), CVA (with no residual deficit), and COPD on 4L home oxygen who presents to the [facility] ED via EMS after an episode of chest pain.   At 8:00 pm she was awaked by severe 8/10 ''grabbing'' non-radiating left side chest pain. No aggravating or relieving factors noted. She reports associated SOB, and the dizziness, when she first sat up in the bed. She moved from her bedroom to the living room and rested in a chair for a while. She tried to distract herself from the pain by reading a book. She also took 2 pills of extra-strength Tylenol which improved the pain somewhat but when the pain persisted for  another 3 hours she decided to call EMS and was brought to [facility] at 11pm.  She had gone to bed feeling well, however her bedroom is a hot because she has no AC there. She thought moving to the living room where there is [facility] would help the chest pain. No syncope, or recent falls. No nausea or vomiting. No associated diaphoresis. She does not usually experiences chest pains, even with ambulation. She denies increasing cough, fever or chills, or increased sputum  production. She reports some wheezing but unable to tell the timing with the chest pain episode. She denies dietary indiscretions, or medication noncompliance. She takes 80 mg of by mouth furosemide in the morning and 40 mg in the evening. Her son and a home aide nurse helps her with her medications.   In the ED, she had received aspirin 325 mg, SL nitroglycerin 0.4 mg X1 , and furosemide 40 mg IV X1. At the time of our evaluation at 4:00 AM, her chest pain, had completely resolved. Actually she reports that her pain resolved improved significantly after NGT by EMS.  On 07/28/2012 she was prescribed a course of Keflex for treatment of a UTI. She has 3 days left for this treatment. Her initial symptoms of dysuria and increased urinary frequence of improved.    Meds:  Current Outpatient Prescriptions  Medication Sig Dispense Refill  . acetaminophen (TYLENOL) 500 MG tablet Take 1,000 mg by mouth 2 (two) times daily as needed. For pain.      Marland Kitchen albuterol (PROVENTIL HFA) 108 (90 BASE) MCG/ACT inhaler Inhale 2 puffs into the lungs every 6 (six) hours as needed. For wheezing.  3.7 g  11  . amLODipine (NORVASC) 5 MG tablet Take 5 mg  by mouth daily.      Marland Kitchen aspirin EC 81 MG EC tablet Take 1 tablet (81 mg total) by mouth daily.      Marland Kitchen atorvastatin (LIPITOR) 40 MG tablet Take 40 mg by mouth daily.      . cephALEXin (KEFLEX) 500 MG capsule Take 1 capsule (500 mg total) by mouth 2 (two) times daily.  14 capsule  0  . colchicine 0.6 MG tablet Take 0.6 mg by mouth See admin instructions. Take for 3 days when gout flares up      . FLUoxetine (PROZAC) 20 MG capsule Take 20 mg by mouth 2 (two) times daily.      . Fluticasone-Salmeterol (ADVAIR) 250-50 MCG/DOSE AEPB Inhale 1 puff into the lungs every 12 (twelve) hours.      . furosemide (LASIX) 40 MG tablet Take 40-80 mg by mouth 2 (two) times daily. 80 mg in the morning and 40 mg at night      . gabapentin (NEURONTIN) 100 MG capsule Take 100 mg by mouth 2 (two)  times daily.      Marland Kitchen glipiZIDE (GLUCOTROL) 10 MG tablet Take 5 mg by mouth 2 (two) times daily before a meal.      . levothyroxine (SYNTHROID) 150 MCG tablet Take 1 tablet (150 mcg total) by mouth daily.  30 tablet  11  . losartan (COZAAR) 50 MG tablet Take 50 mg by mouth daily.      Marland Kitchen omeprazole (PRILOSEC) 20 MG capsule Take 40 mg by mouth 2 (two) times daily.      Marland Kitchen tiotropium (SPIRIVA HANDIHALER) 18 MCG inhalation capsule Place 1 capsule (18 mcg total) into inhaler and inhale daily.  30 capsule  4  . warfarin (COUMADIN) 5 MG tablet Take 5 mg by mouth daily.      Marland Kitchen loperamide (IMODIUM) 2 MG capsule Take 1 capsule (2 mg total) by mouth as needed for diarrhea or loose stools (up to total 16mg  daily).  8 capsule  0    Allergies: Allergies as of 08/06/2012 - Review Complete 08/06/2012  Allergen Reaction Noted  . Lantus (insulin glargine)  05/13/2012  . Nitrofurantoin (macrodantin) Itching 04/20/2010   Past Medical History  Diagnosis Date  . Coronary atherosclerosis of native coronary artery     Mild at cateterization 2006  . Abdominal pain     Likely secondary to presbyesophagus and gastric dysmotility  . Chronic diastolic heart failure   . Respiratory failure, chronic     Mixed etiology with bronchospastic component  . Urinary incontinence     Recurrent uti's/resistance to cipro, bactrim  . Gout   . Postablative hypothyroidism     H/o Graves disease s/p radioactive iodine ablation with resultant postablative hypothyroidism  . Hyperlipidemia   . Depression   . Morbidly obese     s/p gastric plication surgery  . Gastroesophageal reflux disease   . Ventral hernia     Repair in April 2008, complicated by MRSA abdominal wall cellulitis  . DVT (deep venous thrombosis) 1998  . Pulmonary embolism 1998    Greenfield filter  . Diabetes mellitus type 2, controlled, with complications   . Essential hypertension, benign   . AF (atrial fibrillation)     on chronic warfarin  .  Osteoarthritis   . Stroke 1997    Denies residual  . Anemia     Blood transfusion [V58.2]  . Supratherapeutic INR 05/15/2012    Possibly induced by recent steroid use.    Past Surgical History  Procedure Laterality Date  . Cholecystectomy    . Appendectomy    . Breast biopsy    . Gastric bypass  1970's  . Hernia repair  05/2006    ventral hernia  . Greenfield filter placement  1998   Family History  Problem Relation Age of Onset  . Colon cancer Neg Hx    History   Social History  . Marital Status: Widowed    Spouse Name: N/A    Number of Children: N/A  . Years of Education: N/A   Occupational History  . Not on file.   Social History Main Topics  . Smoking status: Former Smoker -- 1.00 packs/day for 1.5 years    Types: Cigarettes  . Smokeless tobacco: Never Used  . Alcohol Use: No  . Drug Use: No  . Sexually Active: No   Other Topics Concern  . Not on file   Social History Narrative   She lives w/her son. Her son offers help and she has an aid who occasionally cooks for her and takes care of some other tasks around the pt's house.   Has medicare. Has son in [place] who is POA   Ambivalent about DNR issues and does not want a STOP sign posted in home   Home Health Aid visits 6 days a week             Review of Systems: Constitutional: Denies fever, chills, diaphoresis, appetite change and fatigue.  HEENT: Denies photophobia, eye pain, redness, hearing loss, ear pain, congestion, sore throat, rhinorrhea, sneezing, mouth sores, trouble swallowing, neck pain, neck stiffness and tinnitus. History of epistaxis the day prior to presentation to the ED, which has now completely resolved. Cardiovascular: Denies palpitations and leg swelling. Reports chronic orthopnea and PND. Gastrointestinal: Denies nausea, vomiting, abdominal pain,or diarrhea. Reports constipation, which improves with her regular laxatives. No history of blood in stool and abdominal distention.    Genitourinary: Denies dysuria, urgency, frequency, hematuria, flank pain and difficulty urinating.  Musculoskeletal: Denies myalgias, back pain, joint swelling, arthralgias and gait problem.  Skin: Denies pallor, rash and wound.  Neurological: Denies seizures, focal weakness, numbness and headaches.  Hematological: Denies adenopathy. Easy bruising, personal or family bleeding history  Psychiatric/Behavioral: Denies suicidal ideation, mood changes, confusion, nervousness, sleep disturbance and agitation   Physical Exam: BP 132/52, HR 59, RR 18, SpO2 98.00% on 4L [place] oxygen. General: Morbidly obese, well nourished; mild acute distressed, cooperative with exam Head: atraumatic, normocephalic,  Eye: pupils equal, round and reactive; sclera anicteric; normal conjunctiva  Nose/throat: oropharynx clear, moist mucous membranes, pink gums  Neck: supple, no carotid bruits, thyroid normal in size and without palpable nodules  Lymph nodes: no cervical or supraclavicular lymphadenopathy  Lungs/Chest wall: Mild bilateral wheezing and rales on the left  infrascapular lung field. No chest wall tenderness.  Heart: Irregular heart rate with Bradycardias in the mid to upper 50s; no murmurs. No JVD  Pulses: radial and dorsalis pedis pulses are 2+ and symmetric  Abdomen: Normal fullness, no rebound, guarding, or rigidity; normal bowel sounds; no masses or organomegaly  Skin: warm, dry, intact, normal turgor, no rashes  Extremities: no peripheral edema, clubbing, or cyanosis Neurologic: A&O X3, CN II - XII are grossly intact. Motor strength is 5/5 in the all 4 extremities, Sensations intact to light touch, Cerebellar signs negative.  Psych: patient is alert and oriented, mood and affect are normal and congruent, thought content is normal without delusions, thought process is linear, speech is normal  and non-pressured, behavior is normal   Lab results: Basic Metabolic Panel:  Recent Labs  65/78/46 0125  NA  136  K 4.6  CL 97  CO2 34*  GLUCOSE 137*  BUN 36*  CREATININE 1.21*  CALCIUM 10.2   CBC:  Recent Labs  08/06/12 0125  WBC 8.1  HGB 9.9*  HCT 32.1*  MCV 89.2  PLT 262   BNP:  Recent Labs  05/25/2012  08/06/12   PROBNP  824  1544.0*    Coagulation:  Recent Labs  08/06/12 0125  LABPROT 28.3*  INR 2.83*    Imaging results:  Dg Chest Port 1 View  08/06/2012   *RADIOLOGY REPORT*  Clinical Data: Chest pain and shortness of breath tonight.  PORTABLE CHEST - 1 VIEW  Comparison: 05/24/2012  Findings: Shallow inspiration.  Cardiac enlargement with pulmonary vascular congestion and perihilar edema.  Findings demonstrate some progression since previous study.  No blunting of costophrenic angles.  No pneumothorax.  Mediastinal contours appear intact. Calcified and tortuous aorta.  Degenerative changes in the spine and shoulders.  IMPRESSION: Cardiac enlargement with pulmonary vascular congestion and perihilar edema, demonstrating progression since previous study.   Original Report Authenticated By: Burman Nieves, M.D.    Other results: EKG: unchanged from previous tracings, atrial fibrillation, rate 55-60, ST flattening in V4, V5, V6, aVF, and lead III, Q waves in lead III, with 3, aVF, V1, V2, V3 and V4. LAD. Compared to the EKG of 05/11/2012 the only slight change is T wave flattening. No ST segment depressions or elevations.  Assessment & Plan by Problem: Principal Problem:   Chest pain at rest Active Problems:   HYPOTHYROIDISM   DIABETES MELLITUS   HYPERLIPIDEMIA   OBESITY, MORBID   DEPRESSION   HYPERTENSION   Atrial fibrillation   Acute on chronic systolic CHF (congestive heart failure)   COPD   CKD (chronic kidney disease) stage 3, GFR 30-59 ml/min  Ms. Jubb is a 79 year old woman with PMH of DM2, HLD, HTN, A. Fib on warfarin, history of PE, CHF (EF of 55% per 2D echo on 04/21/12 ), CAD (MI 20 yrs ago, cath in 2006 with nonobstructive disease, 30-40% stenosis  on circumflex), severe aortic stenosis (mild regurgitation, 0.57cm^2 area per last 2D echo), CVA (with no residual deficit), and COPD who was admitted to [facility] on 08/06/12 with chest pain at rest.   #Chest Pain at Rest: Resolved at admission. Given cardiac history, of greatest concern is unstable angina/ACS. Risk factors obesity, h/o smoking, CAD, DM, HLD. Initial troponin negative, and EKG is without significant changes. In light of elevated pBNP and rales on physical exam, acute on chronic systolic HF is likely contributing. Patient was pain free during assessment and chest was not tender to palpation, so MSK less likely. PE unlikely since she is fully anticoagulated with INR of 2.8. Given dry cough, GERD may be contributing. Acute etiology such as aortic dissection, esophageal rupture, and pneumothorax less likely given clinical exam and CXR findings.   Plan  -Admit to telemetry  -Cycle CE x 3  -ASA 81 daily, Atorvastatin 40mg  daily, not candidate for BB therapy due to bradycardia  -O2 therapy as needed to maintain SpO2 88-92%  -AM EKG  -Lasix 40mg  IV TID (approx 2.5x home dose)  -Protonix 40mg  daily  -NTG prn chest pain   #Acute on Chronic CHF: Patient reports orthopnea, left posterior with rales on exam with pulm vasc congestion on CXR, pBNP elevated (unclear baseline, but 824--> 1544  since 05/25/12 with stable renal function); Most recent EF of 55% per 2D echo on 04/21/12. She reports medication compliance, and denies dietary indiscretions. Does not monitor weight at home. Discharge wt  267 on 05/27/2012. TSH wnl on 06/26/12.   Plan -rule out ACS as above  -Lasix as above, monitor daily renal function  -Daily weights  -Strict I/Os  -Cautiously continue losartan, bradycardia precludes BB   #COPD: Stable, at baseline. On 4L home O2. Clinical presentation not typical of COPD exacerbation. Pneumonia unlikely.  Plan  -Continue O2  -Continue albuterol prn  -Substitute Dulera for Advair  (formulary)   #Atrial Fibrillation: Bradycardic at admission with h/o sick sinus syndrome. Asymptomatic. On chronic anticoagulation with therapeutic INR, with recent epistaxis. Recurrent bradycardia, and intolerance to beta blockers.  Plan -Coumadin per pharmacy  -Monitor Hb in light of epistaxis   #Normocytic Anemia: Baseline Hb seems to be around 11-12. Hb 9.9 at admission, stable from 10.3 at office visit on 07/28/12.  -Closely monitor for further bleeding (recent epistaxis)  -AM CBC   #Hypothyroidism: TSH 4.144 (wnl) on 06/26/12.  -Continue home Levothyroxine 150 mcg daily   #DM: A1c 6.4 on 06/26/12. Recent office visit suggests possible episodes of hypoglycemia and home glipizide was reduced to 5mg  dialy.  -Hold home glipizide  -Moderate SSI with CBGs ACHS  -Continue Gabapentin   #UTI: Dysuria has resolved since office visit on 07/28/12. She was prescribed Keflex 500 BID x 7 days based on history of pan sensitive Ecoli in the past. Note from 08/03/12 suggests HHRN called to say last dose was on 08/04/12, but patient reports she has 3d remaining. Regardless, suggested treatment duration for UTI with Keflex is 7-14d  -Keflex 500 BID x 3 days   #Depression: Stable without SI/HI.  -Continue Prozac   #VTE ppx: Coumadin per pharmacy   #Code Status: Full code  Dispo: Disposition is deferred at this time, awaiting improvement of current medical problems. Anticipated discharge in approximately 1-3 day(s).   The patient does have a current PCP [name] Edouard, MD), therefore will be require OPC follow-up after discharge.   The patient does not have transportation limitations that hinder transportation to clinic appointments.   Signed:  Dow Adolph, MD PGY-1 Internal Medicine Teaching Service Pager: [phone] (7pm-7am) 08/06/2012, 6:08 AM

## 2012-08-06 NOTE — Progress Notes (Signed)
Utilization review completed. Leeah Politano, RN, BSN. 

## 2012-08-06 NOTE — ED Notes (Signed)
Pt. Took 324 ASA at home and was given 1 nitro by EMS with little relief.

## 2012-08-06 NOTE — ED Notes (Signed)
PT. C/o left sided chest pain starting around 2000 last night. States has had this pain before and was seen by a cardiologist but tonight it didn't go away. Pt. Also c/o difficulty breathing. 99% on 4L home O2. States "I've been wheezing".

## 2012-08-06 NOTE — ED Notes (Signed)
Alinda Money (son) please contact @ [phone]

## 2012-08-06 NOTE — H&P (Signed)
Internal Medicine Attending Admission Note Date: 08/06/2012  Patient name: [name] Medical record number: [id number] Date of birth: [birth year] Age: 79 y.o. Gender: female  I saw and evaluated the patient. I reviewed the resident's note and I agree with the resident's findings and plan as documented in the resident's note, with the following additional comments.  Chief Complaint(s): Chest pain  History - key components related to admission: Patient is a 79 year old woman with history of coronary artery disease (cardiac cath 12/28/2004 showed angiographically patent coronary arteries with only a 30-40% area of luminal irregularities in the nondominant circumflex and normal LV systolic function), chronic atrial fibrillation on warfarin, diastolic heart failure with left ventricular ejection fraction of 55% by 2-D echocardiogram 04/21/2012, aortic stenosis with valve area 0.57 cm square by 2-D echo, COPD on home oxygen, type 2 diabetes mellitus, and other problems as outlined in the medical history, admitted with complaint of left chest pain which began around 8 PM last night.  The pain began at rest; it had a deep aching quality, did not radiate to the left arm or neck, was intermittent, and was unrelieved by sublingual nitroglycerin administered by EMS.  The pain resolved following hospital admission and she has no pain at present.   Physical Exam - key components related to admission:  Filed Vitals:   08/06/12 0245 08/06/12 0300 08/06/12 0458 08/06/12 0644  BP: 151/51 132/98 140/67 158/71  Pulse: 56 50 56 52  Temp:    98.1 F (36.7 C)  TempSrc:    Oral  Resp: 20 18 15 18   Height:    5\' 2"  (1.575 m)  Weight:    274 lb 14.4 oz (124.694 kg)  SpO2: 100% 98% 100% 97%   General: Alert, no distress Lungs: Clear Heart: Regular; 3/6 systolic murmur; no S3, no S4 Abdomen: Bowel sounds present, soft, nontender Extremities: No edema; no calf tenderness  Lab results:   Basic Metabolic  Panel:  Recent Labs  08/06/12 0125 08/06/12 0802  NA 136 140  K 4.6 4.0  CL 97 96  CO2 34* 36*  GLUCOSE 137* 116*  BUN 36* 34*  CREATININE 1.21* 1.19*  CALCIUM 10.2 9.7    Liver Function Tests:  Recent Labs  08/06/12 0802  AST 20  ALT 19  ALKPHOS 110  BILITOT 0.5  PROT 7.2  ALBUMIN 3.5    CBC:  Recent Labs  08/06/12 0125 08/06/12 0802  WBC 8.1 6.4  HGB 9.9* 10.0*  HCT 32.1* 32.2*  MCV 89.2 89.2  PLT 262 244    Cardiac Enzymes:  Recent Labs  08/06/12 0802  TROPONINI <0.30    CBG:  Recent Labs  08/06/12 0728  GLUCAP 116*    Coagulation:  Recent Labs  08/06/12 0125  INR 2.83*    Imaging results:  Dg Chest Port 1 View  08/06/2012   *RADIOLOGY REPORT*  Clinical Data: Chest pain and shortness of breath tonight.  PORTABLE CHEST - 1 VIEW  Comparison: 05/24/2012  Findings: Shallow inspiration.  Cardiac enlargement with pulmonary vascular congestion and perihilar edema.  Findings demonstrate some progression since previous study.  No blunting of costophrenic angles.  No pneumothorax.  Mediastinal contours appear intact. Calcified and tortuous aorta.  Degenerative changes in the spine and shoulders.  IMPRESSION: Cardiac enlargement with pulmonary vascular congestion and perihilar edema, demonstrating progression since previous study.   Original Report Authenticated By: Burman Nieves, M.D.    Other results: EKG: Atrial fibrillation; nonspecific IVCD; Q waves in 2, 3,  aVF, and V1 through V4   Assessment & Plan by Problem:  1.  Chest pain.  Patient presented with a prolonged episode of intermittent chest pain at rest which is concerning for anginal pain.  Her EKG shows evidence of prior anterior and inferior MI; her initial cardiac enzymes are negative.  Her pain resolved following admission and has not recurred.  Differential includes anginal pain, symptomatic aortic stenosis, or noncardiac chest pain.  The plan is monitor; aspirin; serial enzymes and  EKG; consult cardiology.  2.  Diastolic congestive heart failure.  Plan is monitor; diurese and follow in/outs, weights, renal function, and electrolytes.  3.  COPD on home oxygen.  Plan is supplement oxygen by nasal cannula and follow O2 saturations; continue home inhaled bronchodilator regimen.  4.  Chronic atrial fibrillation.  Rate is controlled without medication.  Patient's INR is therapeutic on warfarin; the plan is to continue warfarin and INR monitoring.  5.  Other problems and plans as per the resident physician's note.

## 2012-08-06 NOTE — ED Notes (Signed)
Admitting MD at bedside.

## 2012-08-06 NOTE — ED Notes (Signed)
Pt. C/o left sided chest/breast pain starting around 2000 last night. Pt. Denies N/V, SOB. Pt. In afib. Hx of same.

## 2012-08-06 NOTE — Progress Notes (Signed)
ANTICOAGULATION CONSULT NOTE - Initial Consult  Pharmacy Consult for Coumadin Indication: atrial fibrillation  Allergies  Allergen Reactions  . Lantus (Insulin Glargine)     Causes a lot of itching  . Nitrofurantoin (Macrodantin) Itching    Patient Measurements:    Vital Signs: BP: 140/67 mmHg (06/12 0458) Pulse Rate: 56 (06/12 0458)  Labs:  Recent Labs  08/06/12 0125  HGB 9.9*  HCT 32.1*  PLT 262  LABPROT 28.3*  INR 2.83*  CREATININE 1.21*    The CrCl is unknown because both a height and weight (above a minimum accepted value) are required for this calculation.   Medical History: Past Medical History  Diagnosis Date  . Coronary atherosclerosis of native coronary artery     Mild at cateterization 2006  . Abdominal pain     Likely secondary to presbyesophagus and gastric dysmotility  . Chronic diastolic heart failure   . Respiratory failure, chronic     Mixed etiology with bronchospastic component  . Urinary incontinence     Recurrent uti's/resistance to cipro, bactrim  . Gout   . Postablative hypothyroidism     H/o Graves disease s/p radioactive iodine ablation with resultant postablative hypothyroidism  . Hyperlipidemia   . Depression   . Morbidly obese     s/p gastric plication surgery  . Gastroesophageal reflux disease   . Ventral hernia     Repair in April 2008, complicated by MRSA abdominal wall cellulitis  . DVT (deep venous thrombosis) 1998  . Pulmonary embolism 1998    Greenfield filter  . Diabetes mellitus type 2, controlled, with complications   . Essential hypertension, benign   . AF (atrial fibrillation)     on chronic warfarin  . Osteoarthritis   . Stroke 1997    Denies residual  . Anemia     Blood transfusion [V58.2]  . Supratherapeutic INR 05/15/2012    Possibly induced by recent steroid use.     Medications:  Prescriptions prior to admission  Medication Sig Dispense Refill  . acetaminophen (TYLENOL) 500 MG tablet Take 1,000 mg  by mouth 2 (two) times daily as needed. For pain.      Marland Kitchen albuterol (PROVENTIL HFA) 108 (90 BASE) MCG/ACT inhaler Inhale 2 puffs into the lungs every 6 (six) hours as needed. For wheezing.  3.7 g  11  . amLODipine (NORVASC) 5 MG tablet Take 5 mg by mouth daily.      Marland Kitchen aspirin EC 81 MG EC tablet Take 1 tablet (81 mg total) by mouth daily.      Marland Kitchen atorvastatin (LIPITOR) 40 MG tablet Take 40 mg by mouth daily.      . cephALEXin (KEFLEX) 500 MG capsule Take 1 capsule (500 mg total) by mouth 2 (two) times daily.  14 capsule  0  . colchicine 0.6 MG tablet Take 0.6 mg by mouth See admin instructions. Take for 3 days when gout flares up      . FLUoxetine (PROZAC) 20 MG capsule Take 20 mg by mouth 2 (two) times daily.      . Fluticasone-Salmeterol (ADVAIR) 250-50 MCG/DOSE AEPB Inhale 1 puff into the lungs every 12 (twelve) hours.      . furosemide (LASIX) 40 MG tablet Take 40-80 mg by mouth 2 (two) times daily. 80 mg in the morning and 40 mg at night      . gabapentin (NEURONTIN) 100 MG capsule Take 100 mg by mouth 2 (two) times daily.      Marland Kitchen glipiZIDE (GLUCOTROL)  10 MG tablet Take 5 mg by mouth 2 (two) times daily before a meal.      . levothyroxine (SYNTHROID) 150 MCG tablet Take 1 tablet (150 mcg total) by mouth daily.  30 tablet  11  . losartan (COZAAR) 50 MG tablet Take 50 mg by mouth daily.      Marland Kitchen omeprazole (PRILOSEC) 20 MG capsule Take 40 mg by mouth 2 (two) times daily.      Marland Kitchen tiotropium (SPIRIVA HANDIHALER) 18 MCG inhalation capsule Place 1 capsule (18 mcg total) into inhaler and inhale daily.  30 capsule  4  . warfarin (COUMADIN) 5 MG tablet Take 5 mg by mouth daily.      Marland Kitchen loperamide (IMODIUM) 2 MG capsule Take 1 capsule (2 mg total) by mouth as needed for diarrhea or loose stools (up to total 16mg  daily).  8 capsule  0    Assessment: 79 y.o. female presents with chest pain. Pt on coumadin PTA for afib. Baseline INR 2.83 - therapeutic. Pt on 5mg  daily - last dose 6/11. Noted pt reports recent  epistaxis. Hgb 9.9 - will watch  Goal of Therapy:  INR 2-3 Monitor platelets by anticoagulation protocol: Yes   Plan:  1. Daily INR 2. Coumadin 5mg  po daily  Christoper Fabian, PharmD, BCPS Clinical pharmacist, pager [phone] 08/06/2012,6:43 AM

## 2012-08-06 NOTE — Consult Note (Signed)
Admit date: 08/06/2012 Referring Physician  Family Practice Teaching service Primary Physician Aletta Edouard, MD Primary Cardiologist  Armanda Magic, M.D. Reason for Consultation  chest pain  ASSESSMENT: 1. 30-40 minute episode of poorly characterized chest pain. No evidence of myocardial infarction biomarkers her EKG. Cardiac evaluation in 2006 with coronary angiography demonstrated minimal atherosclerosis involving the circumflex  2. COPD with chronic 02 requirement  3. Chronic diastolic heart failure, with elevated BNP and pulmonary congestion on chest x-ray, suggesting an acute decompensation  4. Chronic atrial fibrillation  5. Chronic Coumadin therapy  6. Diabetes mellitus  7. Hypertension  8. Thyroid disease status post thyroidectomy for Graves' disease  9. Aortic stenosis, moderate to severe, based upon transvalvular velocity of 3.2 m/s in 03/2012  10. Nonobstructive coronary disease by catheterization 2006, less than 50% circumflex obstruction  PLAN:  1. Rule out myocardial infarction with markers and serial EKGs. If no recurrence of chest discomfort and no evidence of infarction, no further workup is indicated. She is not a candidate for myocardial perfusion study because of obesity. In the absence of objective evidence of ischemia angiography may be more risky than helpful.  2. I do not feel the aortic valve disease is critical and do not believe it has any role in the patient's chest pain. She would not be a surgical candidate for aortic valve replacement. The valve needs to be followed longitudinally and consideration of TAVR in the valve becomes severe. She may not be a good candidate for tab or because of her body habitus and short stature.  3. Diuresis as tolerated by blood pressure   HPI: Patient is a pleasant obese female in no acute distress. She is lying in a recliner next her bed. She is having no chest discomfort or complaints. She describes the discomfort  as 5 of 10 in intensity lasting 30 minutes with gradual resolution. The quality of the discomfort was poorly characterized. There were no other associated features such as radiation, diaphoresis, or change in dyspnea. This is the first and only occurrence of this type chest discomfort.   PMH:   Past Medical History  Diagnosis Date  . Coronary atherosclerosis of native coronary artery     Mild at cateterization 2006  . Abdominal pain     Likely secondary to presbyesophagus and gastric dysmotility  . Chronic diastolic heart failure   . Respiratory failure, chronic     Mixed etiology with bronchospastic component  . Urinary incontinence     Recurrent uti's/resistance to cipro, bactrim  . Gout   . Postablative hypothyroidism     H/o Graves disease s/p radioactive iodine ablation with resultant postablative hypothyroidism  . Hyperlipidemia   . Depression   . Morbidly obese     s/p gastric plication surgery  . Gastroesophageal reflux disease   . Ventral hernia     Repair in April 2008, complicated by MRSA abdominal wall cellulitis  . DVT (deep venous thrombosis) 1998  . Pulmonary embolism 1998    Greenfield filter  . Diabetes mellitus type 2, controlled, with complications   . Essential hypertension, benign   . AF (atrial fibrillation)     on chronic warfarin  . Osteoarthritis   . Stroke 1997    Denies residual  . Anemia     Blood transfusion [V58.2]  . Supratherapeutic INR 05/15/2012    Possibly induced by recent steroid use.      PSH:   Past Surgical History  Procedure Laterality Date  . Cholecystectomy    .  Appendectomy    . Breast biopsy    . Gastric bypass  1970's  . Hernia repair  05/2006    ventral hernia  . Greenfield filter placement  1998    Allergies:  Lantus and Nitrofurantoin Prior to Admit Meds:   Prescriptions prior to admission  Medication Sig Dispense Refill  . acetaminophen (TYLENOL) 500 MG tablet Take 1,000 mg by mouth 2 (two) times daily as needed.  For pain.      Marland Kitchen albuterol (PROVENTIL HFA) 108 (90 BASE) MCG/ACT inhaler Inhale 2 puffs into the lungs every 6 (six) hours as needed. For wheezing.  3.7 g  11  . amLODipine (NORVASC) 5 MG tablet Take 5 mg by mouth daily.      Marland Kitchen aspirin EC 81 MG EC tablet Take 1 tablet (81 mg total) by mouth daily.      Marland Kitchen atorvastatin (LIPITOR) 40 MG tablet Take 40 mg by mouth daily.      . cephALEXin (KEFLEX) 500 MG capsule Take 1 capsule (500 mg total) by mouth 2 (two) times daily.  14 capsule  0  . colchicine 0.6 MG tablet Take 0.6 mg by mouth See admin instructions. Take for 3 days when gout flares up      . FLUoxetine (PROZAC) 20 MG capsule Take 20 mg by mouth 2 (two) times daily.      . Fluticasone-Salmeterol (ADVAIR) 250-50 MCG/DOSE AEPB Inhale 1 puff into the lungs every 12 (twelve) hours.      . furosemide (LASIX) 40 MG tablet Take 40-80 mg by mouth 2 (two) times daily. 80 mg in the morning and 40 mg at night      . gabapentin (NEURONTIN) 100 MG capsule Take 100 mg by mouth 2 (two) times daily.      Marland Kitchen glipiZIDE (GLUCOTROL) 10 MG tablet Take 5 mg by mouth 2 (two) times daily before a meal.      . levothyroxine (SYNTHROID) 150 MCG tablet Take 1 tablet (150 mcg total) by mouth daily.  30 tablet  11  . losartan (COZAAR) 50 MG tablet Take 50 mg by mouth daily.      Marland Kitchen omeprazole (PRILOSEC) 20 MG capsule Take 40 mg by mouth 2 (two) times daily.      Marland Kitchen tiotropium (SPIRIVA HANDIHALER) 18 MCG inhalation capsule Place 1 capsule (18 mcg total) into inhaler and inhale daily.  30 capsule  4  . warfarin (COUMADIN) 5 MG tablet Take 5 mg by mouth daily.      Marland Kitchen loperamide (IMODIUM) 2 MG capsule Take 1 capsule (2 mg total) by mouth as needed for diarrhea or loose stools (up to total 16mg  daily).  8 capsule  0   Fam HX:    Family History  Problem Relation Age of Onset  . Colon cancer Neg Hx    Social HX:    History   Social History  . Marital Status: Widowed    Spouse Name: N/A    Number of Children: N/A  . Years  of Education: N/A   Occupational History  . Not on file.   Social History Main Topics  . Smoking status: Former Smoker -- 1.00 packs/day for 1.5 years    Types: Cigarettes  . Smokeless tobacco: Never Used  . Alcohol Use: No  . Drug Use: No  . Sexually Active: No   Other Topics Concern  . Not on file   Social History Narrative   She lives w/her son. Her son offers help and she  has an aid who occasionally cooks for her and takes care of some other tasks around the pt's house.   Has medicare. Has son in [place] who is POA   Ambivalent about DNR issues and does not want a STOP sign posted in home   Home Health Aid visits 6 days a week              Review of Systems: Not contributory  Physical Exam: Blood pressure 124/65, pulse 58, temperature 98 F (36.7 C), temperature source Oral, resp. rate 18, height 5\' 2"  (1.575 m), weight 124.694 kg (274 lb 14.4 oz), last menstrual period 05/22/1968, SpO2 95.00%. Weight change:    Morbidly obese but in no acute distress  Somewhat pale appearing skin is warm and dry  Unable to assess neck veins. Carotid upstroke 2+ bilaterally  Chest is clear anteriorly and posteriorly  Cardiac exam reveals distant heart sounds a grade 2 to perhaps 3 of 6 crescendo decrescendo murmur left mid sternal border becoming fainter at the right upper sternal border  Markedly obese abdomen that is nontender.  There is no peripheral edema.  The patient is pleasant and neurologically intact Labs:   Lab Results  Component Value Date   WBC 6.4 08/06/2012   HGB 10.0* 08/06/2012   HCT 32.2* 08/06/2012   MCV 89.2 08/06/2012   PLT 244 08/06/2012    Recent Labs Lab 08/06/12 0802  NA 140  K 4.0  CL 96  CO2 36*  BUN 34*  CREATININE 1.19*  CALCIUM 9.7  PROT 7.2  BILITOT 0.5  ALKPHOS 110  ALT 19  AST 20  GLUCOSE 116*   No results found for this basename: PTT   Lab Results  Component Value Date   INR 2.83* 08/06/2012   INR 2.1 06/26/2012   INR 2.58*  05/27/2012   Lab Results  Component Value Date   CKTOTAL 66 05/08/2010   CKMB 2.9 05/08/2010   TROPONINI <0.30 08/06/2012     Lab Results  Component Value Date   CHOL 136 08/02/2011   CHOL  Value: 162        ATP III CLASSIFICATION:  <200     mg/dL   Desirable  161-096  mg/dL   Borderline High  >=045    mg/dL   High        06/03/8117   CHOL  Value: 133        ATP III CLASSIFICATION:  <200     mg/dL   Desirable  147-829  mg/dL   Borderline High  >=562    mg/dL   High        03/27/8655   Lab Results  Component Value Date   HDL 36* 08/02/2011   HDL 39* 05/08/2010   HDL 44 10/06/2008   Lab Results  Component Value Date   LDLCALC 62 08/02/2011   LDLCALC  Value: 94        Total Cholesterol/HDL:CHD Risk Coronary Heart Disease Risk Table                     Men   Women  1/2 Average Risk   3.4   3.3  Average Risk       5.0   4.4  2 X Average Risk   9.6   7.1  3 X Average Risk  23.4   11.0        Use the calculated Patient Ratio above and the CHD Risk Table to determine the  patient's CHD Risk.        ATP III CLASSIFICATION (LDL):  <100     mg/dL   Optimal  409-811  mg/dL   Near or Above                    Optimal  130-159  mg/dL   Borderline  914-782  mg/dL   High  >956     mg/dL   Very High 04/10/863   LDLCALC  Value: 71        Total Cholesterol/HDL:CHD Risk Coronary Heart Disease Risk Table                     Men   Women  1/2 Average Risk   3.4   3.3  Average Risk       5.0   4.4  2 X Average Risk   9.6   7.1  3 X Average Risk  23.4   11.0        Use the calculated Patient Ratio above and the CHD Risk Table to determine the patient's CHD Risk.        ATP III CLASSIFICATION (LDL):  <100     mg/dL   Optimal  784-696  mg/dL   Near or Above                    Optimal  130-159  mg/dL   Borderline  295-284  mg/dL   High  >132     mg/dL   Very High 4/40/1027   Lab Results  Component Value Date   TRIG 192* 08/02/2011   TRIG 145 05/08/2010   TRIG 92 10/06/2008   Lab Results  Component Value Date   CHOLHDL 3.8  08/02/2011   CHOLHDL 4.2 05/08/2010   CHOLHDL 3.0 10/06/2008   No results found for this basename: LDLDIRECT    Cardiac Panel (last 3 results)  Recent Labs  08/06/12 0802 08/06/12 1205  TROPONINI <0.30 <0.30     BNP    Component Value Date/Time   PROBNP 1544.0* 08/06/2012 0126   Radiology:  Dg Chest Port 1 View  08/06/2012   *RADIOLOGY REPORT*  Clinical Data: Chest pain and shortness of breath tonight.  PORTABLE CHEST - 1 VIEW  Comparison: 05/24/2012  Findings: Shallow inspiration.  Cardiac enlargement with pulmonary vascular congestion and perihilar edema.  Findings demonstrate some progression since previous study.  No blunting of costophrenic angles.  No pneumothorax.  Mediastinal contours appear intact. Calcified and tortuous aorta.  Degenerative changes in the spine and shoulders.  IMPRESSION: Cardiac enlargement with pulmonary vascular congestion and perihilar edema, demonstrating progression since previous study.   Original Report Authenticated By: Burman Nieves, M.D.   EKG:   QS pattern in the inferior and anterior precordial leads compatible with prior old infarctions. Interventricular conduction delay. No significant change compared to tracing from April 2014    Lesleigh Noe 08/06/2012 3:59 PM

## 2012-08-06 NOTE — ED Provider Notes (Signed)
History     CSN: [id number]  Arrival date & time 08/06/12  [name]   First MD Initiated Contact with Patient 08/06/12 0109      Chief Complaint  Patient presents with  . Chest Pain    (Consider location/radiation/quality/duration/timing/severity/associated sxs/prior treatment) Patient is a 79 y.o. female presenting with chest pain. The history is provided by the patient.  Chest Pain She woke up at 8 PM and noted left-sided chest pain. She has difficulty characterizing the pain other than it is deep. She has difficulty quantifying the pain other than saying that it was bad. There was associated dyspnea and nausea without vomiting or diaphoresis. She tried taking some acetaminophen without relief. Nothing made the pain better, nothing made it worse. She called for an ambulance and she was given a condition which did help. Her pain is now not as bad as it was but again, she has difficulty quantifying it. She has had similar pain in the past. She takes aspirin 81 mg daily and has not had any additional aspirin today. Also, she did have nosebleed yesterday which she relates to her warfarin.  Past Medical History  Diagnosis Date  . Coronary atherosclerosis of native coronary artery     Mild at cateterization 2006  . Abdominal pain     Likely secondary to presbyesophagus and gastric dysmotility  . Chronic diastolic heart failure   . Respiratory failure, chronic     Mixed etiology with bronchospastic component  . Urinary incontinence     Recurrent uti's/resistance to cipro, bactrim  . Gout   . Postablative hypothyroidism     H/o Graves disease s/p radioactive iodine ablation with resultant postablative hypothyroidism  . Hyperlipidemia   . Depression   . Morbidly obese     s/p gastric plication surgery  . Gastroesophageal reflux disease   . Ventral hernia     Repair in April 2008, complicated by MRSA abdominal wall cellulitis  . DVT (deep venous thrombosis) 1998  . Pulmonary embolism  1998    Greenfield filter  . Diabetes mellitus type 2, controlled, with complications   . Essential hypertension, benign   . AF (atrial fibrillation)     on chronic warfarin  . Osteoarthritis   . Stroke 1997    Denies residual  . Anemia     Blood transfusion [V58.2]  . Supratherapeutic INR 05/15/2012    Possibly induced by recent steroid use.     Past Surgical History  Procedure Laterality Date  . Cholecystectomy    . Appendectomy    . Breast biopsy    . Gastric bypass  1970's  . Hernia repair  05/2006    ventral hernia  . Greenfield filter placement  1998    Family History  Problem Relation Age of Onset  . Colon cancer Neg Hx     History  Substance Use Topics  . Smoking status: Former Smoker -- 1.00 packs/day for 1.5 years    Types: Cigarettes  . Smokeless tobacco: Never Used  . Alcohol Use: No    OB History   Grav Para Term Preterm Abortions TAB SAB Ect Mult Living                  Review of Systems  Cardiovascular: Positive for chest pain.  All other systems reviewed and are negative.    Allergies  Lantus and Nitrofurantoin  Home Medications   Current Outpatient Rx  Name  Route  Sig  Dispense  Refill  . acetaminophen (  TYLENOL) 500 MG tablet   Oral   Take 1,000 mg by mouth 2 (two) times daily as needed. For pain.         Marland Kitchen albuterol (PROVENTIL HFA) 108 (90 BASE) MCG/ACT inhaler   Inhalation   Inhale 2 puffs into the lungs every 6 (six) hours as needed. For wheezing.   3.7 g   11   . amLODipine (NORVASC) 5 MG tablet   Oral   Take 5 mg by mouth daily.         Marland Kitchen aspirin EC 81 MG EC tablet   Oral   Take 1 tablet (81 mg total) by mouth daily.         Marland Kitchen atorvastatin (LIPITOR) 40 MG tablet   Oral   Take 40 mg by mouth daily.         . cephALEXin (KEFLEX) 500 MG capsule   Oral   Take 1 capsule (500 mg total) by mouth 2 (two) times daily.   14 capsule   0   . colchicine 0.6 MG tablet   Oral   Take 0.6 mg by mouth See admin  instructions. Take for 3 days when gout flares up         . FLUoxetine (PROZAC) 20 MG capsule   Oral   Take 20 mg by mouth 2 (two) times daily.         . Fluticasone-Salmeterol (ADVAIR) 250-50 MCG/DOSE AEPB   Inhalation   Inhale 1 puff into the lungs every 12 (twelve) hours.         . furosemide (LASIX) 40 MG tablet   Oral   Take 40-80 mg by mouth 2 (two) times daily. 80 mg in the morning and 40 mg at night         . gabapentin (NEURONTIN) 100 MG capsule   Oral   Take 100 mg by mouth 2 (two) times daily.         Marland Kitchen glipiZIDE (GLUCOTROL) 10 MG tablet   Oral   Take 5 mg by mouth 2 (two) times daily before a meal.         . levothyroxine (SYNTHROID) 150 MCG tablet   Oral   Take 1 tablet (150 mcg total) by mouth daily.   30 tablet   11   . losartan (COZAAR) 50 MG tablet   Oral   Take 50 mg by mouth daily.         Marland Kitchen omeprazole (PRILOSEC) 20 MG capsule   Oral   Take 40 mg by mouth 2 (two) times daily.         Marland Kitchen tiotropium (SPIRIVA HANDIHALER) 18 MCG inhalation capsule   Inhalation   Place 1 capsule (18 mcg total) into inhaler and inhale daily.   30 capsule   4   . warfarin (COUMADIN) 5 MG tablet   Oral   Take 5 mg by mouth daily.         Marland Kitchen loperamide (IMODIUM) 2 MG capsule   Oral   Take 1 capsule (2 mg total) by mouth as needed for diarrhea or loose stools (up to total 16mg  daily).   8 capsule   0     BP 122/35  Pulse 86  Temp(Src) 98.6 F (37 C) (Oral)  Resp 20  Ht 5\' 2"  (1.575 m)  Wt 272 lb 4.8 oz (123.514 kg)  BMI 49.79 kg/m2  SpO2 98%  LMP 05/22/1968  Physical Exam  Nursing note and vitals reviewed.  80 year  old female, resting comfortably and in no acute distress. Vital signs are normal. Oxygen saturation is 98%, which is normal. Head is normocephalic and atraumatic. PERRLA, EOMI. Oropharynx is clear. Neck is nontender and supple without adenopathy or JVD. Back is nontender and there is no CVA tenderness. Lungs have bibasilar  rales. Chest is nontender. Heart has regular rate and rhythm with 2/6 holosystolic murmur at the upper left sternal border. Abdomen is soft, flat, nontender without masses or hepatosplenomegaly and peristalsis is normoactive. Extremities have no cyanosis or edema, full range of motion is present. Skin is warm and dry without rash. Neurologic: Mental status is normal, cranial nerves are intact, there are no motor or sensory deficits.  ED Course  Procedures (including critical care time)  Results for orders placed during the hospital encounter of 08/06/12  CBC      Result Value Range   WBC 8.1  4.0 - 10.5 K/uL   RBC 3.60 (*) 3.87 - 5.11 MIL/uL   Hemoglobin 9.9 (*) 12.0 - 15.0 g/dL   HCT 40.9 (*) 81.1 - 91.4 %   MCV 89.2  78.0 - 100.0 fL   MCH 27.5  26.0 - 34.0 pg   MCHC 30.8  30.0 - 36.0 g/dL   RDW 78.2 (*) 95.6 - 21.3 %   Platelets 262  150 - 400 K/uL  BASIC METABOLIC PANEL      Result Value Range   Sodium 136  135 - 145 mEq/L   Potassium 4.6  3.5 - 5.1 mEq/L   Chloride 97  96 - 112 mEq/L   CO2 34 (*) 19 - 32 mEq/L   Glucose, Bld 137 (*) 70 - 99 mg/dL   BUN 36 (*) 6 - 23 mg/dL   Creatinine, Ser 0.86 (*) 0.50 - 1.10 mg/dL   Calcium 57.8  8.4 - 46.9 mg/dL   GFR calc non Af Amer 42 (*) >90 mL/min   GFR calc Af Amer 48 (*) >90 mL/min  PRO B NATRIURETIC PEPTIDE      Result Value Range   Pro B Natriuretic peptide (BNP) 1544.0 (*) 0 - 450 pg/mL  PROTIME-INR      Result Value Range   Prothrombin Time 28.3 (*) 11.6 - 15.2 seconds   INR 2.83 (*) 0.00 - 1.49  POCT I-STAT TROPONIN I      Result Value Range   Troponin i, poc 0.02  0.00 - 0.08 ng/mL   Comment 3            Dg Chest Port 1 View  08/06/2012   *RADIOLOGY REPORT*  Clinical Data: Chest pain and shortness of breath tonight.  PORTABLE CHEST - 1 VIEW  Comparison: 05/24/2012  Findings: Shallow inspiration.  Cardiac enlargement with pulmonary vascular congestion and perihilar edema.  Findings demonstrate some progression since  previous study.  No blunting of costophrenic angles.  No pneumothorax.  Mediastinal contours appear intact. Calcified and tortuous aorta.  Degenerative changes in the spine and shoulders.  IMPRESSION: Cardiac enlargement with pulmonary vascular congestion and perihilar edema, demonstrating progression since previous study.   Original Report Authenticated By: Burman Nieves, M.D.      Date: 08/06/2012  Rate: 55  Rhythm: atrial fibrillation  QRS Axis: left  Intervals: normal  ST/T Wave abnormalities: normal  Conduction Disutrbances:left anterior fascicular block  Narrative Interpretation: Atrial fibrillation with slow ventricular response, low voltage, left anterior fascicular block, old anteroseptal myocardial infarction, old inferior wall myocardial infarction. When compared with ECG of 05/22/2012, no significant changes  are seen.  Old EKG Reviewed: unchanged    1. Chest pain   2. Anemia   3. Renal insufficiency   4. Bradycardia       MDM  Chest pain worrisome for acute coronary event. She's given additional aspirin and nitroglycerin. Chest x-ray and laboratory workup has been initiated. Review of past records shows that she has been managed for chronic CHF, atrial fibrillation, and chronic anticoagulation. She also has a history of chronic kidney disease.  She got excellent relief of pain with the nitroglycerin and she is now pain-free. Workup shows BNP is elevated at and increased from last value but level is approximately where her baseline is. Her CBC shows anemia with slight drop in hemoglobin compared with last value but not clinically significant. Renal insufficiency is stable and mild. INR is therapeutic. Case is discussed with Dr. Everardo Beals Of internal medicine teaching service who agrees to admit the patient.      Dione Booze, MD 08/08/12 [phone]

## 2012-08-07 ENCOUNTER — Encounter (HOSPITAL_COMMUNITY): Payer: Self-pay | Admitting: General Practice

## 2012-08-07 DIAGNOSIS — R079 Chest pain, unspecified: Secondary | ICD-10-CM

## 2012-08-07 HISTORY — DX: Chest pain, unspecified: R07.9

## 2012-08-07 LAB — BASIC METABOLIC PANEL
BUN: 38 mg/dL — ABNORMAL HIGH (ref 6–23)
Calcium: 9.7 mg/dL (ref 8.4–10.5)
Creatinine, Ser: 1.1 mg/dL (ref 0.50–1.10)
GFR calc Af Amer: 54 mL/min — ABNORMAL LOW (ref >90)
GFR calc non Af Amer: 47 mL/min — ABNORMAL LOW (ref >90)

## 2012-08-07 LAB — GLUCOSE, CAPILLARY
Glucose-Capillary: 125 mg/dL — ABNORMAL HIGH (ref 70–99)
Glucose-Capillary: 154 mg/dL — ABNORMAL HIGH (ref 70–99)

## 2012-08-07 LAB — PROTIME-INR
INR: 2.15 — ABNORMAL HIGH (ref 0.00–1.49)
Prothrombin Time: 23.1 seconds — ABNORMAL HIGH (ref 11.6–15.2)

## 2012-08-07 MED ORDER — MENTHOL 3 MG MT LOZG
1.0000 | LOZENGE | OROMUCOSAL | Status: DC | PRN
  Administered 2012-08-07: 3 mg via ORAL
  Filled 2012-08-07 (×2): qty 9

## 2012-08-07 MED ORDER — WARFARIN SODIUM 7.5 MG PO TABS
7.5000 mg | ORAL_TABLET | Freq: Once | ORAL | Status: AC
  Administered 2012-08-07: 7.5 mg via ORAL
  Filled 2012-08-07: qty 1

## 2012-08-07 NOTE — Clinical Documentation Improvement (Signed)
Abnormal Labs Clarification  THIS DOCUMENT IS NOT A PERMANENT PART OF THE MEDICAL RECORD  TO RESPOND TO THE THIS QUERY, FOLLOW THE INSTRUCTIONS BELOW:  1. If needed, update documentation for the patient's encounter via the notes activity.  2. Access this query again and click edit on the [employer].  3. After updating, or not, click F2 to complete all highlighted (required) fields concerning your review. Select "additional documentation in the medical record" OR "no additional documentation provided".  4. Click Sign note button.  5. The deficiency will fall out of your InBasket *Please let us know if you are not able to complete this workflow by phone or e-mail (listed below).  Please update your documentation within the medical record to reflect your response to this query.                                                                                   08/07/12  Dear Dr. Meredith Pel,   Noted in chart patient has CHF, CKD, CAD and HTN. Please clarify source of CHF if known or suspected:  - due to ischemia - due to HTN - due to CAD and HTN - other (please specify)       Reviewed: additional documentation in the medical record   Thank You,  Beverley Fiedler RN BSN Clinical Documentation Specialist Tele Contact:  [phone]  Health Information Management [place]

## 2012-08-07 NOTE — Progress Notes (Signed)
Subjective: Patient states that she has not had any further chest pain. Complains of a sore throat this morning. States that it hurts when swallowing food, however, she all of her breakfast tray. She states that she is worried about her throat.  Had good urine output yesterday but she went in the bed a lot so we were unable to quantify.  Denies worsening SOB, chest pain.  Objective: Vital signs in last 24 hours: Filed Vitals:   08/06/12 2109 08/06/12 2131 08/07/12 0500 08/07/12 0744  BP: 138/124 130/72 134/60 141/54  Pulse: 48  48   Temp: 97.8 F (36.6 C)  97.9 F (36.6 C)   TempSrc:      Resp: 16  16   Height:      Weight:      SpO2: 97%  97%    Weight change:   Intake/Output Summary (Last 24 hours) at 08/07/12 1030 Last data filed at 08/07/12 0800  Gross per 24 hour  Intake   3010 ml  Output      0 ml  Net   3010 ml    Physical Exam Blood pressure 141/54, pulse 48, temperature 97.9 F (36.6 C), temperature source Oral, resp. rate 16, height 5\' 2"  (1.575 m), weight 274 lb 14.4 oz (124.694 kg), last menstrual period 05/22/1968, SpO2 97.00%. General:  No acute distress, alert and oriented, obese CF HEENT:  PERRL, EOMI, moist mucous membranes, oropharynx is clear, uvula midline, no erythema or exudate, no enlarged tonsils Cardiovascular: irregular rhythm, murmur noted Respiratory:  Moving good air, mild crackles at bases, no wheezing Abdomen:  Soft, obese, nondistended, nontender, bowel sounds present, erythematous nodule over previously healed midline scar from excoriation Extremities:  Warm and well-perfused, 1-2+ pitting edema b/l Skin: Warm, dry, no rashes Neuro: Not anxious appearing, no depressed mood, normal affect  Lab Results: Basic Metabolic Panel:  Recent Labs Lab 08/06/12 0125 08/06/12 0802  NA 136 140  K 4.6 4.0  CL 97 96  CO2 34* 36*  GLUCOSE 137* 116*  BUN 36* 34*  CREATININE 1.21* 1.19*  CALCIUM 10.2 9.7   Liver Function Tests:  Recent  Labs Lab 08/06/12 0802  AST 20  ALT 19  ALKPHOS 110  BILITOT 0.5  PROT 7.2  ALBUMIN 3.5   CBC:  Recent Labs Lab 08/06/12 0125 08/06/12 0802  WBC 8.1 6.4  HGB 9.9* 10.0*  HCT 32.1* 32.2*  MCV 89.2 89.2  PLT 262 244   Cardiac Enzymes:  Recent Labs Lab 08/06/12 0802 08/06/12 1205 08/06/12 1818  TROPONINI <0.30 <0.30 <0.30   BNP:  Recent Labs Lab 08/06/12 0126  PROBNP 1544.0*   CBG:  Recent Labs Lab 08/06/12 0728 08/06/12 1135 08/06/12 1651 08/06/12 2105 08/07/12 0740  GLUCAP 116* 154* 118* 150* 125*   Coagulation:  Recent Labs Lab 08/06/12 0125 08/07/12 0405  LABPROT 28.3* 23.1*  INR 2.83* 2.15*    Studies/Results: Dg Chest Port 1 View  08/06/2012   *RADIOLOGY REPORT*  Clinical Data: Chest pain and shortness of breath tonight.  PORTABLE CHEST - 1 VIEW  Comparison: 05/24/2012  Findings: Shallow inspiration.  Cardiac enlargement with pulmonary vascular congestion and perihilar edema.  Findings demonstrate some progression since previous study.  No blunting of costophrenic angles.  No pneumothorax.  Mediastinal contours appear intact. Calcified and tortuous aorta.  Degenerative changes in the spine and shoulders.  IMPRESSION: Cardiac enlargement with pulmonary vascular congestion and perihilar edema, demonstrating progression since previous study.   Original Report Authenticated By: Burman Nieves,  M.D.   Medications:  Medications reviewed  Scheduled Meds: . amLODipine  5 mg Oral Daily  . aspirin EC  81 mg Oral Daily  . atorvastatin  40 mg Oral Daily  . cephALEXin  500 mg Oral Q12H  . FLUoxetine  20 mg Oral BID  . furosemide  40 mg Intravenous TID WC  . gabapentin  100 mg Oral BID  . insulin aspart  0-15 Units Subcutaneous TID WC  . levothyroxine  150 mcg Oral QAC breakfast  . losartan  50 mg Oral Daily  . mometasone-formoterol  2 puff Inhalation BID  . pantoprazole  40 mg Oral Daily  . sodium chloride  3 mL Intravenous Q12H  . tiotropium  18  mcg Inhalation Daily  . warfarin  5 mg Oral q1800  . Warfarin - Pharmacist Dosing Inpatient   Does not apply q1800   Continuous Infusions:  PRN Meds:.sodium chloride, acetaminophen, albuterol, menthol-cetylpyridinium, nitroGLYCERIN, ondansetron (ZOFRAN) IV, sodium chloride  Assessment/Plan:   Acute on Chronic CHF This is the likely cause of the patients chest pain. Cardiac enzymes cycled and negative. No further ischemic work up per cardiology. I&O's challenging as patient not urinating in hat for measurement, has incontinence. Possibly from CHF exacerbation as she has mild crackles on exam and elevated proBNP on admission. Last EF 55% 03/2012.  -appreciate cardiology consult -continue lasix 40mg  IV TID -cont daily aspirin, lipitor -follow renal function  Aortic stenosis She does have moderate-severe AS with velocity across the valve 3.6m/s from Feb 2014 ECHO.  Not a candidate for valve replacement at this time, but this is not thought to be causing her symptoms of chest pain. -cont to monitor  Sore throat No abnormalities on physical exam -possibly early viral infection, no reason for further work up at this time -cepachol for symptomatic relief  HTN -continue current medication  DM -continue current meds  UTI -cont keflex until 6/14  Thyroid disease Grave s/p thyroidectomy -cont synthroid  COPD Stable, no wheezing -cont home inhalers, O2  Chronic atrial fibrillation on coumadin -continue coumadin  FEN -heart healthy diet with fluid restriction  DVT ppx -coumadin  Dispo -likely DC tomorrow after one more day of IV diuresis    LOS: 1 day   [place] Ar 08/07/2012, 10:30 AM

## 2012-08-07 NOTE — Progress Notes (Signed)
SUBJECTIVE:  No further chest pain  OBJECTIVE:   Vitals:   Filed Vitals:   08/06/12 2109 08/06/12 2131 08/07/12 0500 08/07/12 0744  BP: 138/124 130/72 134/60 141/54  Pulse: 48  48   Temp: 97.8 F (36.6 C)  97.9 F (36.6 C)   TempSrc:      Resp: 16  16   Height:      Weight:      SpO2: 97%  97%    I&O's:   Intake/Output Summary (Last 24 hours) at 08/07/12 0915 Last data filed at 08/07/12 0757  Gross per 24 hour  Intake   2650 ml  Output      0 ml  Net   2650 ml   TELEMETRY: Reviewed telemetry pt in atrial fibrillation with CVR     PHYSICAL EXAM General: Well developed, well nourished, in no acute distress Head: Eyes PERRLA, No xanthomas.   Normal cephalic and atramatic  Lungs:   Crackles at bases Heart:   Irregularly irregular S1 S2 Pulses are 2+ & equal. Abdomen: Bowel sounds are positive, abdomen soft and non-tender without masses  Extremities:   No clubbing, cyanosis or edema.  DP +1 Neuro: Alert and oriented X 3. Psych:  Good affect, responds appropriately   LABS: Basic Metabolic Panel:  Recent Labs  40/98/11 0125 08/06/12 0802  NA 136 140  K 4.6 4.0  CL 97 96  CO2 34* 36*  GLUCOSE 137* 116*  BUN 36* 34*  CREATININE 1.21* 1.19*  CALCIUM 10.2 9.7   Liver Function Tests:  Recent Labs  08/06/12 0802  AST 20  ALT 19  ALKPHOS 110  BILITOT 0.5  PROT 7.2  ALBUMIN 3.5   No results found for this basename: LIPASE, AMYLASE,  in the last 72 hours CBC:  Recent Labs  08/06/12 0125 08/06/12 0802  WBC 8.1 6.4  HGB 9.9* 10.0*  HCT 32.1* 32.2*  MCV 89.2 89.2  PLT 262 244   Cardiac Enzymes:  Recent Labs  08/06/12 0802 08/06/12 1205 08/06/12 1818  TROPONINI <0.30 <0.30 <0.30  Coag Panel:   Lab Results  Component Value Date   INR 2.15* 08/07/2012   INR 2.83* 08/06/2012   INR 2.1 06/26/2012    RADIOLOGY: Dg Chest Port 1 View  08/06/2012   *RADIOLOGY REPORT*  Clinical Data: Chest pain and shortness of breath tonight.  PORTABLE CHEST - 1 VIEW   Comparison: 05/24/2012  Findings: Shallow inspiration.  Cardiac enlargement with pulmonary vascular congestion and perihilar edema.  Findings demonstrate some progression since previous study.  No blunting of costophrenic angles.  No pneumothorax.  Mediastinal contours appear intact. Calcified and tortuous aorta.  Degenerative changes in the spine and shoulders.  IMPRESSION: Cardiac enlargement with pulmonary vascular congestion and perihilar edema, demonstrating progression since previous study.   Original Report Authenticated By: Burman Nieves, M.D.   ASSESSMENT:  1. 30-40 minute episode of poorly characterized chest pain. No evidence of myocardial infarction biomarkers her EKG. Cardiac evaluation in 2006 with coronary angiography demonstrated minimal atherosclerosis involving the circumflex.  Cardiac enzymes negative x 3 2. COPD with chronic 02 requirement  3. Chronic diastolic heart failure, with elevated BNP and pulmonary congestion on chest x-ray, suggesting an acute decompensation  4. Chronic atrial fibrillation  5. Chronic Coumadin therapy  6. Diabetes mellitus  7. Hypertension  8. Thyroid disease status post thyroidectomy for Graves' disease  9. Aortic stenosis, moderate to severe, based upon transvalvular velocity of 3.2 m/s in 03/2012  10. Nonobstructive coronary  disease by catheterization 2006, less than 50% circumflex obstruction   PLAN: 1.  No further ischemic w/u at this time.   2.  Continue IV diuretics and record I&O's and daily weights 3.  Follow renal function closely   Quintella Reichert, MD  08/07/2012  9:15 AM

## 2012-08-07 NOTE — Progress Notes (Addendum)
Tried to re-enforce use of BSC to be able to collect urine output; pt states that she cannot tell when she has to go to bathroom, but will try and use BSC

## 2012-08-07 NOTE — Clinical Documentation Improvement (Signed)
CHF DOCUMENTATION CLARIFICATION QUERY  THIS DOCUMENT IS NOT A PERMANENT PART OF THE MEDICAL RECORD  TO RESPOND TO THE THIS QUERY, FOLLOW THE INSTRUCTIONS BELOW:  1. If needed, update documentation for the patient's encounter via the notes activity.  2. Access this query again and click edit on the In [employer].  3. After updating, or not, click F2 to complete all highlighted (required) fields concerning your review. Select "additional documentation in the medical record" OR "no additional documentation provided".  4. Click Sign note button.  5. The deficiency will fall out of your In Basket *Please let us know if you are not able to complete this workflow by phone or e-mail (listed below).  Please update your documentation within the medical record to reflect your response to this query.                                                                                    08/07/12  Dear Dr. Meredith Pel,   Noted in H&P history of Chronic Diastolic CHF with EF 55% as of 2/14 and an admitting diagnosis of Acute on Chronic Systolic CHF. Please clarify if known or suspected type of Acute on Chronic CHF being treated. Thank you.   Reviewed: additional documentation in the medical record  Thank You,  Beverley Fiedler RN BSN Clinical Documentation Specialist: Tele Contact:  [phone]  Health Information Management [place]

## 2012-08-07 NOTE — Progress Notes (Signed)
Internal Medicine Attending  Date: 08/07/2012  Patient name: [name] Medical record number: [id number] Date of birth: Nov 12, 1932 Age: 79 y.o. Gender: female  I saw and evaluated the patient. I reviewed the resident's note by Dr. Collier Bullock and I agree with the resident's findings and plans as documented in her note.  Dr. Rogelia Boga will cover as the on-call attending physician this weekend, and Dr. Dalphine Handing will take over as attending physician on Monday 08/10/2012.

## 2012-08-07 NOTE — Progress Notes (Signed)
ANTICOAGULATION CONSULT NOTE - Follow Up Consult  Pharmacy Consult for Coumadin Indication: atrial fibrillation  Allergies  Allergen Reactions  . Lantus (Insulin Glargine)     Causes a lot of itching  . Nitrofurantoin (Macrodantin) Itching    Patient Measurements: Height: 5\' 2"  (157.5 cm) Weight: 274 lb 14.4 oz (124.694 kg) IBW/kg (Calculated) : 50.1  Vital Signs: Temp: 97.9 F (36.6 C) (06/13 0500) BP: 112/65 mmHg (06/13 1030) Pulse Rate: 48 (06/13 0500)  Labs:  Recent Labs  08/06/12 0125 08/06/12 0802 08/06/12 1205 08/06/12 1818 08/07/12 0405  HGB 9.9* 10.0*  --   --   --   HCT 32.1* 32.2*  --   --   --   PLT 262 244  --   --   --   LABPROT 28.3*  --   --   --  23.1*  INR 2.83*  --   --   --  2.15*  CREATININE 1.21* 1.19*  --   --   --   TROPONINI  --  <0.30 <0.30 <0.30  --     Estimated Creatinine Clearance: 49.1 ml/min (by C-G formula based on Cr of 1.19).  Assessment: 80yof continues on coumadin for afib. INR is therapeutic but decreased from admission. Continues on cephalexin as pta (through 6/15) which can increase the INR. CBC low but stable. No bleeding reported.   Goal of Therapy:  INR 2-3 Monitor platelets by anticoagulation protocol: Yes   Plan:  1) Increase coumadin to 7.5mg  x 1 tonight 2) Follow up INR  Fredrik Rigger 08/07/2012,11:17 AM

## 2012-08-08 LAB — GLUCOSE, CAPILLARY: Glucose-Capillary: 190 mg/dL — ABNORMAL HIGH (ref 70–99)

## 2012-08-08 MED ORDER — WARFARIN SODIUM 6 MG PO TABS
6.0000 mg | ORAL_TABLET | Freq: Once | ORAL | Status: AC
  Administered 2012-08-08: 6 mg via ORAL
  Filled 2012-08-08: qty 1

## 2012-08-08 NOTE — Progress Notes (Signed)
Subjective:  C/o dyspnea on exertion.  No chest pain.  No dyspnea at rest.  Objective:  Vital Signs in the last 24 hours: BP 143/49  Pulse 86  Temp(Src) 98.6 F (37 C) (Oral)  Resp 20  Ht 5\' 2"  (1.575 m)  Wt 123.514 kg (272 lb 4.8 oz)  BMI 49.79 kg/m2  SpO2 98%  LMP 05/22/1968  Physical Exam: Obese WF in NAD Lungs:  Wheezing bilat Cardiac:  irregular rhythm, normal S1 and S2, no S3 Extremities:  trace edema present  Intake/Output from previous day: 06/13 0701 - 06/14 0700 In: 820 [P.O.:820] Out: 50 [Urine:50] Weight Filed Weights   08/06/12 0644 08/08/12 0500  Weight: 124.694 kg (274 lb 14.4 oz) 123.514 kg (272 lb 4.8 oz)    Lab Results: Basic Metabolic Panel:  Recent Labs  40/98/11 0802 08/07/12 1150  NA 140 136  K 4.0 4.5  CL 96 94*  CO2 36* 32  GLUCOSE 116* 165*  BUN 34* 38*  CREATININE 1.19* 1.10    CBC:  Recent Labs  08/06/12 0125 08/06/12 0802  WBC 8.1 6.4  HGB 9.9* 10.0*  HCT 32.1* 32.2*  MCV 89.2 89.2  PLT 262 244    BNP    Component Value Date/Time   PROBNP 1544.0* 08/06/2012 0126    PROTIME: Lab Results  Component Value Date   INR 2.16* 08/08/2012   INR 2.15* 08/07/2012   INR 2.83* 08/06/2012    Telemetry Atrial fibrillation with slow response  Assessment/Plan:  1. Chest pain resolved without recurrence 2. COPD with chronic 02 requirement  3. Chronic diastolic heart failure, with elevated BNP and pulmonary congestion on chest x-ray, improved 4. Chronic atrial fibrillation  5. Chronic Coumadin therapy   Rec:  No further cardiac w/u is necessary.  She would benefit from continued diuresis.      Darden Palmer  MD [facility]  08/08/2012, 10:36 AM

## 2012-08-08 NOTE — Progress Notes (Signed)
TC to son to obtain number for [facility] to pick pt up for discharge. TC to [facility] LM for pt D/C, 1314: TC to [facility]HA after no RTC she reports she is out of town and not available. TCLM to son on discharge status 1400 TC to MD to update discharge status. TC from son RE: [facility] response. And discharge status. TC to SW to update situation. Talked to pt about potential plan to discharge Pt does not have any keys to get into residence. TC to SW to update.

## 2012-08-08 NOTE — Progress Notes (Signed)
ANTICOAGULATION CONSULT NOTE - Follow Up Consult  Pharmacy Consult for Coumadin Indication: atrial fibrillation  Allergies  Allergen Reactions  . Lantus (Insulin Glargine)     Causes a lot of itching  . Nitrofurantoin (Macrodantin) Itching    Patient Measurements: Height: 5\' 2"  (157.5 cm) Weight: 272 lb 4.8 oz (123.514 kg) IBW/kg (Calculated) : 50.1  Vital Signs: Temp: 98.6 F (37 C) (06/14 0500) BP: 122/35 mmHg (06/14 0500) Pulse Rate: 86 (06/14 0500)  Labs:  Recent Labs  08/06/12 0125 08/06/12 0802 08/06/12 1205 08/06/12 1818 08/07/12 0405 08/07/12 1150 08/08/12 0436  HGB 9.9* 10.0*  --   --   --   --   --   HCT 32.1* 32.2*  --   --   --   --   --   PLT 262 244  --   --   --   --   --   LABPROT 28.3*  --   --   --  23.1*  --  23.2*  INR 2.83*  --   --   --  2.15*  --  2.16*  CREATININE 1.21* 1.19*  --   --   --  1.10  --   TROPONINI  --  <0.30 <0.30 <0.30  --   --   --     Estimated Creatinine Clearance: 52.9 ml/min (by C-G formula based on Cr of 1.1).  Assessment:Chest pain  Anticoagulation: Pt on coumadin PTA for afib and h/o PE. Baseline INR 2.8 on 5mg  daily. Noted pt reports recent epistaxis x1 that lasted several hours PTA. Hgb 10 - will watch. INR 2.16 stable.  Infectious Disease: Afeb. WBC 6.4. Keflex for UTI (as PTA) ends 6/15.  Cardiovascular: afib, diast HF with EF 55%, CAD, severe AS, HLD, HTN. BP 122/35, HR 86. Meds: Norvasc 5mg , ASA 81mg , Lipitor, IV Lasix, losartan  Endocrinology: dM, hypothyroid. SSI with CBGs 89-235  on Synthroid  Gastrointestinal / Nutrition: morbid obesity. Po PPI  Neurology: h/o CVA, depression on Prozac, Neurontin  Nephrology: CKD stage 3. Scr 1.1  Pulmonary: COPD - 4L O2 at home, h/o tobacco on Dulera, Spiriva  Hematology / Oncology: hgb 10 - low but stable  PTA Medication Issues  Best Practices: coumadin   Goal of Therapy:  INR 2-3 Monitor platelets by anticoagulation protocol: Yes   Plan:  1)  Coumadin 6mg  po x 1 tonight 2) Follow up INR daily   Madalaine Portier S. Merilynn Finland, PharmD, BCPS Clinical Staff Pharmacist Pager [phone]  Misty Stanley Stillinger 08/08/2012,9:16 AM

## 2012-08-08 NOTE — Progress Notes (Signed)
CSW received call to assist patient with transfer home today.  Patient lives alone and has a private duty care aide from 8 am until 1 pm daily (through [employer]).  Her aide- [place] [phone] stated that she was currently out of town and would not be home until tomorrow morning.  Patient's son Valentino Saxon [phone] was notified and he stated that he was also out of town and was unable to meet his mother at home.  MD is aware of above and requested that d/c be upheld. EMS has been arranged to pick up patient after dinner (6pm) and to transport home.  Son relayed where the door keys were kept and gave permission to give information to EMS workers to unlock patient's door.  (Written instructions placed with EMS transport form). Patient's aide will be available in the a.m to assist her.  Son, MD and pt's nurse Marylu Lund is aware of above.  CSW signing off.  Lorri Frederick. [place]  [phone]

## 2012-08-08 NOTE — Progress Notes (Signed)
Subjective: Patient states that she has not had any further chest pain. She slept well and ate breakfast fully. She is lying flat in bed asleep when I enter. She states that she was making good fluid although was unable to use the bed side commode. She was up walking with the walker yesterday and did well without getting very winded. Does not mention the throat soreness she was having yesterday although when asked states it is a little better but still occasional.  Denies worsening SOB, chest pain, fevers, chills, abdominal pain.  Objective: Vital signs in last 24 hours: Filed Vitals:   08/07/12 1758 08/07/12 1936 08/07/12 2005 08/08/12 0500  BP: 135/43 132/52  122/35  Pulse:  53  86  Temp:  98 F (36.7 C)  98.6 F (37 C)  TempSrc:      Resp:  18  20  Height:      Weight:    272 lb 4.8 oz (123.514 kg)  SpO2:  96% 100% 98%   Weight change:   Intake/Output Summary (Last 24 hours) at 08/08/12 0957 Last data filed at 08/07/12 2200  Gross per 24 hour  Intake    460 ml  Output     50 ml  Net    410 ml    Physical Exam Blood pressure 122/35, pulse 86, temperature 98.6 F (37 C), temperature source Oral, resp. rate 20, height 5\' 2"  (1.575 m), weight 272 lb 4.8 oz (123.514 kg), last menstrual period 05/22/1968, SpO2 98.00%. General:  No acute distress, alert and oriented, obese CF HEENT:  PERRL, EOMI, moist mucous membranes, oropharynx is clear, uvula midline, no erythema or exudate, no enlarged tonsils Cardiovascular: irregular rhythm, murmur noted Respiratory:  Moving good air, mild crackles at bases, no wheezing Abdomen:  Soft, obese, nondistended, nontender, bowel sounds present, erythematous nodule over previously healed midline scar from excoriation Extremities:  Warm and well-perfused, 1-2+ pitting edema b/l Skin: Warm, dry, no rashes Neuro: Not anxious appearing, no depressed mood, normal affect  Lab Results: Basic Metabolic Panel:  Recent Labs Lab 08/06/12 0802  08/07/12 1150  NA 140 136  K 4.0 4.5  CL 96 94*  CO2 36* 32  GLUCOSE 116* 165*  BUN 34* 38*  CREATININE 1.19* 1.10  CALCIUM 9.7 9.7   Liver Function Tests:  Recent Labs Lab 08/06/12 0802  AST 20  ALT 19  ALKPHOS 110  BILITOT 0.5  PROT 7.2  ALBUMIN 3.5   CBC:  Recent Labs Lab 08/06/12 0125 08/06/12 0802  WBC 8.1 6.4  HGB 9.9* 10.0*  HCT 32.1* 32.2*  MCV 89.2 89.2  PLT 262 244   Cardiac Enzymes:  Recent Labs Lab 08/06/12 0802 08/06/12 1205 08/06/12 1818  TROPONINI <0.30 <0.30 <0.30   BNP:  Recent Labs Lab 08/06/12 0126  PROBNP 1544.0*   CBG:  Recent Labs Lab 08/06/12 2105 08/07/12 0740 08/07/12 1153 08/07/12 1629 08/07/12 1934 08/08/12 0754  GLUCAP 150* 125* 154* 89 235* 132*   Coagulation:  Recent Labs Lab 08/06/12 0125 08/07/12 0405 08/08/12 0436  LABPROT 28.3* 23.1* 23.2*  INR 2.83* 2.15* 2.16*    Studies/Results: No results found. Medications:  Medications reviewed  Scheduled Meds: . amLODipine  5 mg Oral Daily  . aspirin EC  81 mg Oral Daily  . atorvastatin  40 mg Oral Daily  . cephALEXin  500 mg Oral Q12H  . FLUoxetine  20 mg Oral BID  . furosemide  40 mg Intravenous TID WC  . gabapentin  100  mg Oral BID  . insulin aspart  0-15 Units Subcutaneous TID WC  . levothyroxine  150 mcg Oral QAC breakfast  . losartan  50 mg Oral Daily  . mometasone-formoterol  2 puff Inhalation BID  . pantoprazole  40 mg Oral Daily  . sodium chloride  3 mL Intravenous Q12H  . tiotropium  18 mcg Inhalation Daily  . warfarin  6 mg Oral ONCE-1800  . Warfarin - Pharmacist Dosing Inpatient   Does not apply q1800   Continuous Infusions:  PRN Meds:.sodium chloride, acetaminophen, albuterol, menthol-cetylpyridinium, nitroGLYCERIN, ondansetron (ZOFRAN) IV, sodium chloride  Assessment/Plan:   Acute on Chronic CHF This is the likely cause of the patients chest pain. Cardiac enzymes cycled and negative. No further ischemic work up per cardiology.  I&O's challenging as patient not urinating in hat for measurement, has incontinence. Last EF 55% 03/2012. -resume home lasix 80 mg qam and 40 mg qpm at home on discharge -cont daily aspirin, lipitor  Aortic stenosis She does have moderate-severe AS with velocity across the valve 3.37m/s from Feb 2014 ECHO.  Not a candidate for valve replacement at this time, but this is not thought to be causing her symptoms of chest pain. -cont to monitor  HTN -continue current medication  DM -continue current meds  UTI -d/c keflex on discharge  Thyroid disease Grave s/p thyroidectomy -cont synthroid  COPD Stable, no wheezing -cont home inhalers, O2  Chronic atrial fibrillation on coumadin -continue coumadin  FEN -heart healthy diet with fluid restriction  DVT ppx -coumadin  Dispo -home today   LOS: 2 days   Genella Mech 08/08/2012, 9:57 AM

## 2012-08-08 NOTE — Discharge Summary (Signed)
Internal Medicine Teaching [facility] Discharge Note  Name: CADINCE HILSCHER MRN: [id number] DOB: June 22, 1932 79 y.o.  Date of Admission: 08/06/2012 12:25 AM Date of Discharge: 08/08/2012 Attending Physician: Farley Ly, MD  Discharge Diagnosis: Principal Problem:   Chest pain at rest Active Problems:   HYPOTHYROIDISM   DIABETES MELLITUS   HYPERLIPIDEMIA   OBESITY, MORBID   DEPRESSION   HYPERTENSION   Atrial fibrillation   Acute on chronic systolic CHF (congestive heart failure)   COPD   CKD (chronic kidney disease) stage 3, GFR 30-59 ml/min   Discharge Medications:   Medication List    STOP taking these medications       cephALEXin 500 MG capsule  Commonly known as:  KEFLEX      TAKE these medications       acetaminophen 500 MG tablet  Commonly known as:  TYLENOL  Take 1,000 mg by mouth 2 (two) times daily as needed. For pain.     albuterol 108 (90 BASE) MCG/ACT inhaler  Commonly known as:  PROVENTIL HFA  Inhale 2 puffs into the lungs every 6 (six) hours as needed. For wheezing.     amLODipine 5 MG tablet  Commonly known as:  NORVASC  Take 5 mg by mouth daily.     aspirin 81 MG EC tablet  Take 1 tablet (81 mg total) by mouth daily.     atorvastatin 40 MG tablet  Commonly known as:  LIPITOR  Take 40 mg by mouth daily.     colchicine 0.6 MG tablet  Take 0.6 mg by mouth See admin instructions. Take for 3 days when gout flares up     FLUoxetine 20 MG capsule  Commonly known as:  PROZAC  Take 20 mg by mouth 2 (two) times daily.     Fluticasone-Salmeterol 250-50 MCG/DOSE Aepb  Commonly known as:  ADVAIR  Inhale 1 puff into the lungs every 12 (twelve) hours.     furosemide 40 MG tablet  Commonly known as:  LASIX  Take 40-80 mg by mouth 2 (two) times daily. 80 mg in the morning and 40 mg at night     gabapentin 100 MG capsule  Commonly known as:  NEURONTIN  Take 100 mg by mouth 2 (two) times daily.     glipiZIDE 10 MG tablet  Commonly  known as:  GLUCOTROL  Take 5 mg by mouth 2 (two) times daily before a meal.     levothyroxine 150 MCG tablet  Commonly known as:  SYNTHROID  Take 1 tablet (150 mcg total) by mouth daily.     loperamide 2 MG capsule  Commonly known as:  IMODIUM  Take 1 capsule (2 mg total) by mouth as needed for diarrhea or loose stools (up to total 16mg  daily).     losartan 50 MG tablet  Commonly known as:  COZAAR  Take 50 mg by mouth daily.     omeprazole 20 MG capsule  Commonly known as:  PRILOSEC  Take 40 mg by mouth 2 (two) times daily.     tiotropium 18 MCG inhalation capsule  Commonly known as:  SPIRIVA HANDIHALER  Place 1 capsule (18 mcg total) into inhaler and inhale daily.     warfarin 5 MG tablet  Commonly known as:  COUMADIN  Take 5 mg by mouth daily.        Disposition and follow-up:   Ms.Janecia L Vandergrift was discharged from [facility] in Stable condition.  At the hospital  follow up visit please address volume status and recurrent chest pain.  Follow-up Appointments:     Follow-up Information   Follow up with [facility]. (The clinic will call on Monday with your appointment date and time.)    Contact information:   [address] [id number] [place] [address] [phone]      Call Quintella Reichert, MD. (Make an appointment for 2-4 weeks from now.)    Contact information:   [address] [place] [address] [place] [address] [phone]      Discharge Orders   Future Appointments Provider Department Dept Phone   10/13/2012 8:30 AM Sherrie George, MD [facility] [phone]   Future Orders Complete By Expires     (HEART FAILURE PATIENTS) Call MD:  Anytime you have any of the following symptoms: 1) 3 pound weight gain in 24 hours or 5 pounds in 1 week 2) shortness of breath, with or without a dry hacking cough 3) swelling in the hands, feet or stomach 4) if you have to sleep on extra pillows at  night in order to breathe.  As directed     Diet - low sodium heart healthy  As directed     Increase activity slowly  As directed        Consultations: Treatment Team:  Quintella Reichert, MD  Procedures Performed:  Dg Chest Port 1 View  08/06/2012   *RADIOLOGY REPORT*  Clinical Data: Chest pain and shortness of breath tonight.  PORTABLE CHEST - 1 VIEW  Comparison: 05/24/2012  Findings: Shallow inspiration.  Cardiac enlargement with pulmonary vascular congestion and perihilar edema.  Findings demonstrate some progression since previous study.  No blunting of costophrenic angles.  No pneumothorax.  Mediastinal contours appear intact. Calcified and tortuous aorta.  Degenerative changes in the spine and shoulders.  IMPRESSION: Cardiac enlargement with pulmonary vascular congestion and perihilar edema, demonstrating progression since previous study.   Original Report Authenticated By: Burman Nieves, M.D.   Admission HPI:  Ms. Mumpower is a 79 year old woman with PMH of DM2, hyperlipidemia, HTN, A. Fib on warfarin, history of PE, CHF (EF of 55% per 2D echo on 04/21/12 ), CAD (MI 20 yrs ago, cath in 2006 with nonobstructive disease, 30-40% stenosis on circumflex), severe aortic stenosis (mild regurgitation, 0.57cm^2 area per last 2D echo), CVA (with no residual deficit), and COPD on 4L home oxygen who presents to the [facility] ED via EMS after an episode of chest pain.  At 8:00 pm she was awaked by severe 8/10 ''grabbing'' non-radiating left side chest pain. No aggravating or relieving factors noted. She reports associated SOB, and the dizziness, when she first sat up in the bed. She moved from her bedroom to the living room and rested in a chair for a while. She tried to distract herself from the pain by reading a book. She also took 2 pills of extra-strength Tylenol which improved the pain somewhat but when the pain persisted for another 3 hours she decided to call EMS and was brought to [facility] at 11pm.  She had  gone to bed feeling well, however her bedroom is a hot because she has no AC there. She thought moving to the living room where there is [facility] would help the chest pain. No syncope, or recent falls. No nausea or vomiting. No associated diaphoresis. She does not usually experiences chest pains, even with ambulation. She denies increasing cough, fever or chills, or increased  sputum production. She reports some wheezing but unable to tell the timing with the chest pain episode. She denies dietary indiscretions, or medication noncompliance. She takes 80 mg of by mouth furosemide in the morning and 40 mg in the evening. Her son and a home aide nurse helps her with her medications.  In the ED, she had received aspirin 325 mg, SL nitroglycerin 0.4 mg X1 , and furosemide 40 mg IV X1.  At the time of our evaluation at 4:00 AM, her chest pain, had completely resolved. Actually she reports that her pain resolved improved significantly after NGT by EMS.  On 07/28/2012 she was prescribed a course of Keflex for treatment of a UTI. She has 3 days left for this treatment. Her initial symptoms of dysuria and increased urinary frequence of improved.  Hospital Course by problem list:  Chest pain at rest - She had negative troponins times 3 and no recurrence of chest pain during admission. Not felt to be cardiac in nature although did diurese while in patient. Was likely GI related as she improved on protonix. She was therapeutic on coumadin at admission making PE very low likelihood. Cardiology did not feel that repeat echo would help given that her aortic stenosis was moderate less than 6 months previously and she was not having SOB, syncope. She will follow up with cardiology as an out-patient and they can decide if and when she needs a repeat stress test. Last known ischemic evaluation was catheterization from 2006 which showed minimal narrowing of one vessel.  HYPOTHYROIDISM - Stable and maintained on her home synthroid  dosing. TSH from may was in normal range so did not recheck. She was been taking her dosing at home well.   DIABETES MELLITUS - She was maintained on SSI and home equivalents while in patient and did not have hypoglycemia episodes or hyperglycemic episodes which required adjustment of her regimen.   HYPERLIPIDEMIA - Stable on statin.  HYPERTENSION - Stable on home ARB, norvasc, lasix.  Atrial fibrillation - Stable and maintained on coumadin while here. She was in sinus rthymn this admission and her heart rate was not elevated during this admission. Not on beta blocker at home.   Acute on chronic systolic CHF (congestive heart failure) due to CAD/HTN- She was diuresed well with lasix IV while in patient (40 mg tid) and although it could not be quantified due to incontinence she was having less congestion on exam and leg swelling. She was switched back to home dose of lasix (80 qam 40 qpm) and kept on ARB and coumadin while in patient. She was seen by cardiology who did not feel as though she needed further ischemic work up or work up of her aortic stenosis which was moderate in February of 2014. Continued on ARB, lasix, statin.   COPD - Stable, chronic, on home 4 L kept on home inhalers. She was able to ambulate successfully with walker while in the hospital.   CKD (chronic kidney disease) stage 3, GFR 30-59 ml/min - Stable at time of discharge even through diuresis.      Discharge Vitals:  BP 122/35  Pulse 86  Temp(Src) 98.6 F (37 C) (Oral)  Resp 20  Ht 5\' 2"  (1.575 m)  Wt 272 lb 4.8 oz (123.514 kg)  BMI 49.79 kg/m2  SpO2 98%  LMP 05/22/1968  Discharge Labs:  Results for orders placed during the hospital encounter of 08/06/12 (from the past 24 hour(s))  BASIC METABOLIC PANEL  Status: Abnormal   Collection Time    08/07/12 11:50 AM      Result Value Range   Sodium 136  135 - 145 mEq/L   Potassium 4.5  3.5 - 5.1 mEq/L   Chloride 94 (*) 96 - 112 mEq/L   CO2 32  19 - 32 mEq/L    Glucose, Bld 165 (*) 70 - 99 mg/dL   BUN 38 (*) 6 - 23 mg/dL   Creatinine, Ser 1.61  0.50 - 1.10 mg/dL   Calcium 9.7  8.4 - 09.6 mg/dL   GFR calc non Af Amer 47 (*) >90 mL/min   GFR calc Af Amer 54 (*) >90 mL/min  GLUCOSE, CAPILLARY     Status: Abnormal   Collection Time    08/07/12 11:53 AM      Result Value Range   Glucose-Capillary 154 (*) 70 - 99 mg/dL  GLUCOSE, CAPILLARY     Status: None   Collection Time    08/07/12  4:29 PM      Result Value Range   Glucose-Capillary 89  70 - 99 mg/dL  GLUCOSE, CAPILLARY     Status: Abnormal   Collection Time    08/07/12  7:34 PM      Result Value Range   Glucose-Capillary 235 (*) 70 - 99 mg/dL  PROTIME-INR     Status: Abnormal   Collection Time    08/08/12  4:36 AM      Result Value Range   Prothrombin Time 23.2 (*) 11.6 - 15.2 seconds   INR 2.16 (*) 0.00 - 1.49  GLUCOSE, CAPILLARY     Status: Abnormal   Collection Time    08/08/12  7:54 AM      Result Value Range   Glucose-Capillary 132 (*) 70 - 99 mg/dL    Signed: Genella Mech 08/08/2012, 9:57 AM   Time Spent on Discharge: 25 minutes Services Ordered on Discharge: none Equipment Ordered on Discharge: none

## 2012-08-11 ENCOUNTER — Telehealth: Payer: Self-pay | Admitting: *Deleted

## 2012-08-11 ENCOUNTER — Inpatient Hospital Stay (HOSPITAL_COMMUNITY)
Admission: EM | Admit: 2012-08-11 | Discharge: 2012-08-15 | DRG: 292 | Disposition: A | Discharge status: Home / Self Care | Payer: Medicare PPO | Attending: Internal Medicine | Admitting: Internal Medicine

## 2012-08-11 ENCOUNTER — Emergency Department (HOSPITAL_COMMUNITY): Payer: Medicare PPO

## 2012-08-11 DIAGNOSIS — D649 Anemia, unspecified: Secondary | ICD-10-CM | POA: Diagnosis present

## 2012-08-11 DIAGNOSIS — E039 Hypothyroidism, unspecified: Secondary | ICD-10-CM | POA: Diagnosis present

## 2012-08-11 DIAGNOSIS — Z7901 Long term (current) use of anticoagulants: Secondary | ICD-10-CM

## 2012-08-11 DIAGNOSIS — M109 Gout, unspecified: Secondary | ICD-10-CM | POA: Diagnosis present

## 2012-08-11 DIAGNOSIS — Z87891 Personal history of nicotine dependence: Secondary | ICD-10-CM

## 2012-08-11 DIAGNOSIS — I359 Nonrheumatic aortic valve disorder, unspecified: Secondary | ICD-10-CM | POA: Diagnosis present

## 2012-08-11 DIAGNOSIS — J9610 Chronic respiratory failure, unspecified whether with hypoxia or hypercapnia: Secondary | ICD-10-CM | POA: Diagnosis present

## 2012-08-11 DIAGNOSIS — E119 Type 2 diabetes mellitus without complications: Secondary | ICD-10-CM | POA: Diagnosis present

## 2012-08-11 DIAGNOSIS — Z9981 Dependence on supplemental oxygen: Secondary | ICD-10-CM

## 2012-08-11 DIAGNOSIS — F3289 Other specified depressive episodes: Secondary | ICD-10-CM | POA: Diagnosis present

## 2012-08-11 DIAGNOSIS — M1990 Unspecified osteoarthritis, unspecified site: Secondary | ICD-10-CM | POA: Diagnosis present

## 2012-08-11 DIAGNOSIS — K219 Gastro-esophageal reflux disease without esophagitis: Secondary | ICD-10-CM | POA: Diagnosis present

## 2012-08-11 DIAGNOSIS — I2510 Atherosclerotic heart disease of native coronary artery without angina pectoris: Secondary | ICD-10-CM | POA: Diagnosis present

## 2012-08-11 DIAGNOSIS — I10 Essential (primary) hypertension: Secondary | ICD-10-CM | POA: Diagnosis present

## 2012-08-11 DIAGNOSIS — I4891 Unspecified atrial fibrillation: Secondary | ICD-10-CM | POA: Diagnosis present

## 2012-08-11 DIAGNOSIS — N183 Chronic kidney disease, stage 3 unspecified: Secondary | ICD-10-CM | POA: Diagnosis present

## 2012-08-11 DIAGNOSIS — J4489 Other specified chronic obstructive pulmonary disease: Secondary | ICD-10-CM | POA: Diagnosis present

## 2012-08-11 DIAGNOSIS — E1149 Type 2 diabetes mellitus with other diabetic neurological complication: Secondary | ICD-10-CM | POA: Diagnosis present

## 2012-08-11 DIAGNOSIS — I129 Hypertensive chronic kidney disease with stage 1 through stage 4 chronic kidney disease, or unspecified chronic kidney disease: Secondary | ICD-10-CM | POA: Diagnosis present

## 2012-08-11 DIAGNOSIS — I350 Nonrheumatic aortic (valve) stenosis: Secondary | ICD-10-CM

## 2012-08-11 DIAGNOSIS — J449 Chronic obstructive pulmonary disease, unspecified: Secondary | ICD-10-CM

## 2012-08-11 DIAGNOSIS — I509 Heart failure, unspecified: Secondary | ICD-10-CM | POA: Diagnosis present

## 2012-08-11 DIAGNOSIS — E785 Hyperlipidemia, unspecified: Secondary | ICD-10-CM | POA: Diagnosis present

## 2012-08-11 DIAGNOSIS — I5033 Acute on chronic diastolic (congestive) heart failure: Principal | ICD-10-CM | POA: Diagnosis present

## 2012-08-11 DIAGNOSIS — E1142 Type 2 diabetes mellitus with diabetic polyneuropathy: Secondary | ICD-10-CM | POA: Diagnosis present

## 2012-08-11 DIAGNOSIS — F329 Major depressive disorder, single episode, unspecified: Secondary | ICD-10-CM | POA: Diagnosis present

## 2012-08-11 DIAGNOSIS — R001 Bradycardia, unspecified: Secondary | ICD-10-CM

## 2012-08-11 DIAGNOSIS — Z86718 Personal history of other venous thrombosis and embolism: Secondary | ICD-10-CM

## 2012-08-11 DIAGNOSIS — Z6841 Body Mass Index (BMI) 40.0 and over, adult: Secondary | ICD-10-CM

## 2012-08-11 DIAGNOSIS — Z9884 Bariatric surgery status: Secondary | ICD-10-CM

## 2012-08-11 DIAGNOSIS — Z86711 Personal history of pulmonary embolism: Secondary | ICD-10-CM

## 2012-08-11 DIAGNOSIS — E890 Postprocedural hypothyroidism: Secondary | ICD-10-CM | POA: Diagnosis present

## 2012-08-11 LAB — CBC
Hemoglobin: 10.1 g/dL — ABNORMAL LOW (ref 12.0–15.0)
MCH: 27.2 pg (ref 26.0–34.0)
MCHC: 30.5 g/dL (ref 30.0–36.0)
MCV: 89 fL (ref 78.0–100.0)

## 2012-08-11 LAB — BASIC METABOLIC PANEL
BUN: 43 mg/dL — ABNORMAL HIGH (ref 6–23)
Calcium: 10.3 mg/dL (ref 8.4–10.5)
Creatinine, Ser: 1.12 mg/dL — ABNORMAL HIGH (ref 0.50–1.10)
GFR calc non Af Amer: 46 mL/min — ABNORMAL LOW (ref >90)
Glucose, Bld: 177 mg/dL — ABNORMAL HIGH (ref 70–99)
Sodium: 138 mEq/L (ref 135–145)

## 2012-08-11 LAB — POCT I-STAT TROPONIN I

## 2012-08-11 LAB — PROTIME-INR: INR: 2.16 — ABNORMAL HIGH (ref 0.00–1.49)

## 2012-08-11 MED ORDER — ALBUTEROL SULFATE (5 MG/ML) 0.5% IN NEBU
5.0000 mg | INHALATION_SOLUTION | Freq: Once | RESPIRATORY_TRACT | Status: AC
  Administered 2012-08-11: 5 mg via RESPIRATORY_TRACT
  Filled 2012-08-11: qty 1

## 2012-08-11 MED ORDER — FUROSEMIDE 10 MG/ML IJ SOLN
80.0000 mg | Freq: Once | INTRAMUSCULAR | Status: AC
  Administered 2012-08-12: 80 mg via INTRAVENOUS
  Filled 2012-08-11: qty 8

## 2012-08-11 NOTE — ED Notes (Signed)
Resp. Distress x 2 days. Worsening today.Marland Kitchen Hx. Afib, copd, hf.  ems gave combivent and solumederol.  Exp. Wheeze upper right.  90 % prior to neb.  On concentrator at home.  Home o2 3 l.

## 2012-08-11 NOTE — ED Notes (Signed)
Dr. Bebe Shaggy aware that pt. Does desat on 4l [place] (77-88%); 100% NRB 99%; pt. alert

## 2012-08-11 NOTE — Telephone Encounter (Signed)
[facility] called for resumption of orders after disch for nursing and PT, verbal approval given, do you agree?

## 2012-08-11 NOTE — ED Provider Notes (Signed)
History     CSN: [id number]  Arrival date & time 08/11/12  2155   First MD Initiated Contact with Patient 08/11/12 2322      Chief Complaint  Patient presents with  . Shortness of Breath    (Consider location/radiation/quality/duration/timing/severity/associated sxs/prior treatment) HPI This is a 79 year old female with a history of atrial fibrillation and chronic respiratory disease on chronic home oxygen at 4 L. She is here with difficulty breathing since yesterday evening. It became severe enough this evening that EMS was contacted. She was administered a neb treatment (albuterol and Atrovent) at home prior to transport with some improvement; she was also given Solu-Medrol IV. Her symptoms have been severe enough that she has been chair ridden all day. Symptoms are worse with attempted ambulation. She denies chest pain, nausea, vomiting or diarrhea. The patient appears fidgety but denies discomfort.  Past Medical History  Diagnosis Date  . Coronary atherosclerosis of native coronary artery     Mild at cateterization 2006  . Abdominal pain     Likely secondary to presbyesophagus and gastric dysmotility  . Chronic diastolic heart failure   . Respiratory failure, chronic     Mixed etiology with bronchospastic component  . Urinary incontinence     Recurrent uti's/resistance to cipro, bactrim  . Gout   . Postablative hypothyroidism     H/o Graves disease s/p radioactive iodine ablation with resultant postablative hypothyroidism  . Hyperlipidemia   . Depression   . Morbidly obese     s/p gastric plication surgery  . Gastroesophageal reflux disease   . Ventral hernia     Repair in April 2008, complicated by MRSA abdominal wall cellulitis  . DVT (deep venous thrombosis) 1998  . Pulmonary embolism 1998    Greenfield filter  . Diabetes mellitus type 2, controlled, with complications   . Essential hypertension, benign   . AF (atrial fibrillation)     on chronic warfarin  .  Osteoarthritis   . Stroke 1997    Denies residual  . Anemia     Blood transfusion [V58.2]  . Supratherapeutic INR 05/15/2012    Possibly induced by recent steroid use.   . Chest pain at rest 08/07/2012    Past Surgical History  Procedure Laterality Date  . Cholecystectomy    . Appendectomy    . Breast biopsy    . Gastric bypass  1970's  . Hernia repair  05/2006    ventral hernia  . Greenfield filter placement  1998    Family History  Problem Relation Age of Onset  . Colon cancer Neg Hx     History  Substance Use Topics  . Smoking status: Former Smoker -- 1.00 packs/day for 1.5 years    Types: Cigarettes  . Smokeless tobacco: Never Used  . Alcohol Use: No    OB History   Grav Para Term Preterm Abortions TAB SAB Ect Mult Living                  Review of Systems  All other systems reviewed and are negative.    Allergies  Lantus and Nitrofurantoin  Home Medications   Current Outpatient Rx  Name  Route  Sig  Dispense  Refill  . acetaminophen (TYLENOL) 500 MG tablet   Oral   Take 1,000 mg by mouth 2 (two) times daily as needed. For pain.         Marland Kitchen albuterol (PROVENTIL HFA) 108 (90 BASE) MCG/ACT inhaler   Inhalation  Inhale 2 puffs into the lungs every 6 (six) hours as needed. For wheezing.   3.7 g   11   . amLODipine (NORVASC) 5 MG tablet   Oral   Take 5 mg by mouth daily.         Marland Kitchen aspirin EC 81 MG EC tablet   Oral   Take 1 tablet (81 mg total) by mouth daily.         Marland Kitchen atorvastatin (LIPITOR) 40 MG tablet   Oral   Take 40 mg by mouth daily.         . colchicine 0.6 MG tablet   Oral   Take 0.6 mg by mouth See admin instructions. Take for 3 days when gout flares up         . FLUoxetine (PROZAC) 20 MG capsule   Oral   Take 20 mg by mouth 2 (two) times daily.         . Fluticasone-Salmeterol (ADVAIR) 250-50 MCG/DOSE AEPB   Inhalation   Inhale 1 puff into the lungs every 12 (twelve) hours.         . furosemide (LASIX) 40 MG  tablet   Oral   Take 40-80 mg by mouth 2 (two) times daily. 80 mg in the morning and 40 mg at night         . gabapentin (NEURONTIN) 100 MG capsule   Oral   Take 100 mg by mouth 2 (two) times daily.         Marland Kitchen glipiZIDE (GLUCOTROL) 10 MG tablet   Oral   Take 5 mg by mouth 2 (two) times daily before a meal.         . levothyroxine (SYNTHROID) 150 MCG tablet   Oral   Take 1 tablet (150 mcg total) by mouth daily.   30 tablet   11   . loperamide (IMODIUM) 2 MG capsule   Oral   Take 1 capsule (2 mg total) by mouth as needed for diarrhea or loose stools (up to total 16mg  daily).   8 capsule   0   . losartan (COZAAR) 50 MG tablet   Oral   Take 50 mg by mouth daily.         Marland Kitchen omeprazole (PRILOSEC) 20 MG capsule   Oral   Take 40 mg by mouth 2 (two) times daily.         Marland Kitchen tiotropium (SPIRIVA HANDIHALER) 18 MCG inhalation capsule   Inhalation   Place 1 capsule (18 mcg total) into inhaler and inhale daily.   30 capsule   4   . warfarin (COUMADIN) 5 MG tablet   Oral   Take 5 mg by mouth daily.           BP 140/54  Pulse 68  Temp(Src) 98.6 F (37 C) (Oral)  Resp 20  SpO2 100%  LMP 05/22/1968  Physical Exam General: Well-developed, obese female in no acute distress; appearance consistent with age of record HENT: normocephalic, atraumatic Eyes: pupils equal round and reactive to light; extraocular muscles intact Neck: supple Heart: regular rate and rhythm; distant sounds Lungs: Distant sound; shallow breaths Abdomen: soft; obese; mild diffuse tenderness; bowel sounds present Extremities: No deformity; full range of motion; trace edema of lower legs Neurologic: Awake, alert; motor function intact in all extremities and symmetric; no facial droop; appears fidgety Skin: Warm and dry    ED Course  Procedures (including critical care time)     MDM   Nursing notes  and vitals signs, including pulse oximetry, reviewed.  Summary of this visit's results,  reviewed by myself:  Labs:  Results for orders placed during the hospital encounter of 08/11/12 (from the past 24 hour(s))  CBC     Status: Abnormal   Collection Time    08/11/12 10:40 PM      Result Value Range   WBC 12.2 (*) 4.0 - 10.5 K/uL   RBC 3.72 (*) 3.87 - 5.11 MIL/uL   Hemoglobin 10.1 (*) 12.0 - 15.0 g/dL   HCT 21.3 (*) 08.6 - 57.8 %   MCV 89.0  78.0 - 100.0 fL   MCH 27.2  26.0 - 34.0 pg   MCHC 30.5  30.0 - 36.0 g/dL   RDW 46.9 (*) 62.9 - 52.8 %   Platelets 251  150 - 400 K/uL  BASIC METABOLIC PANEL     Status: Abnormal   Collection Time    08/11/12 10:40 PM      Result Value Range   Sodium 138  135 - 145 mEq/L   Potassium 4.5  3.5 - 5.1 mEq/L   Chloride 98  96 - 112 mEq/L   CO2 32  19 - 32 mEq/L   Glucose, Bld 177 (*) 70 - 99 mg/dL   BUN 43 (*) 6 - 23 mg/dL   Creatinine, Ser 4.13 (*) 0.50 - 1.10 mg/dL   Calcium 24.4  8.4 - 01.0 mg/dL   GFR calc non Af Amer 46 (*) >90 mL/min   GFR calc Af Amer 53 (*) >90 mL/min  PROTIME-INR     Status: Abnormal   Collection Time    08/11/12 10:40 PM      Result Value Range   Prothrombin Time 23.2 (*) 11.6 - 15.2 seconds   INR 2.16 (*) 0.00 - 1.49  PRO B NATRIURETIC PEPTIDE     Status: Abnormal   Collection Time    08/11/12 10:41 PM      Result Value Range   Pro B Natriuretic peptide (BNP) 2413.0 (*) 0 - 450 pg/mL  POCT I-STAT TROPONIN I     Status: None   Collection Time    08/11/12 11:07 PM      Result Value Range   Troponin i, poc 0.00  0.00 - 0.08 ng/mL   Comment 3             Imaging Studies: Dg Chest Portable 1 View  08/11/2012   *RADIOLOGY REPORT*  Clinical Data: Shortness of breath.  PORTABLE CHEST - 1 VIEW  Comparison: Chest x-ray 08/06/2012.  Findings: Lung volumes are low.  Elevation of the right hemidiaphragm (unchanged). There is cephalization of the pulmonary vasculature, indistinctness of the interstitial markings, and patchy airspace disease throughout the lungs bilaterally suggestive of moderate pulmonary  edema.  No definite pleural effusions.  Mild cardiomegaly is unchanged.  Upper mediastinal contours are within normal limits.  Atherosclerosis in the thoracic aorta.  IMPRESSION: 1.  The appearance of the chest, as above is suggestive of congestive heart failure. 2.  Atherosclerosis.   Original Report Authenticated By: Trudie Reed, M.D.      EKG Interpretation:  Date & Time: 08/11/2012 10:06 PM  Rate: 72  Rhythm: atrial fibrillation  QRS Axis: normal  Intervals: normal  ST/T Wave abnormalities: normal  Conduction Disutrbances:left bundle branch block  Narrative Interpretation:   Old EKG Reviewed: unchanged          Hanley Seamen, MD 08/14/12 2248

## 2012-08-12 ENCOUNTER — Encounter (HOSPITAL_COMMUNITY): Payer: Self-pay | Admitting: *Deleted

## 2012-08-12 DIAGNOSIS — I2510 Atherosclerotic heart disease of native coronary artery without angina pectoris: Secondary | ICD-10-CM

## 2012-08-12 DIAGNOSIS — I10 Essential (primary) hypertension: Secondary | ICD-10-CM

## 2012-08-12 DIAGNOSIS — I509 Heart failure, unspecified: Secondary | ICD-10-CM

## 2012-08-12 DIAGNOSIS — I4891 Unspecified atrial fibrillation: Secondary | ICD-10-CM

## 2012-08-12 DIAGNOSIS — I359 Nonrheumatic aortic valve disorder, unspecified: Secondary | ICD-10-CM

## 2012-08-12 DIAGNOSIS — I5033 Acute on chronic diastolic (congestive) heart failure: Principal | ICD-10-CM

## 2012-08-12 LAB — DIFFERENTIAL
Lymphocytes Relative: 9 % — ABNORMAL LOW (ref 12–46)
Lymphs Abs: 1.1 10*3/uL (ref 0.7–4.0)
Monocytes Relative: 6 % (ref 3–12)
Neutro Abs: 10.2 10*3/uL — ABNORMAL HIGH (ref 1.7–7.7)
Neutrophils Relative %: 84 % — ABNORMAL HIGH (ref 43–77)

## 2012-08-12 LAB — BASIC METABOLIC PANEL
BUN: 41 mg/dL — ABNORMAL HIGH (ref 6–23)
Chloride: 98 mEq/L (ref 96–112)
GFR calc Af Amer: 61 mL/min — ABNORMAL LOW (ref >90)
Potassium: 4.6 mEq/L (ref 3.5–5.1)

## 2012-08-12 LAB — CBC
HCT: 30.9 % — ABNORMAL LOW (ref 36.0–46.0)
Hemoglobin: 9.6 g/dL — ABNORMAL LOW (ref 12.0–15.0)
RDW: 16.3 % — ABNORMAL HIGH (ref 11.5–15.5)
WBC: 8.8 10*3/uL (ref 4.0–10.5)

## 2012-08-12 LAB — GLUCOSE, CAPILLARY
Glucose-Capillary: 152 mg/dL — ABNORMAL HIGH (ref 70–99)
Glucose-Capillary: 195 mg/dL — ABNORMAL HIGH (ref 70–99)

## 2012-08-12 LAB — TROPONIN I: Troponin I: <0.3 ng/mL (ref <0.30)

## 2012-08-12 MED ORDER — AMLODIPINE BESYLATE 5 MG PO TABS
5.0000 mg | ORAL_TABLET | Freq: Every day | ORAL | Status: DC
  Administered 2012-08-12 – 2012-08-15 (×4): 5 mg via ORAL
  Filled 2012-08-12 (×4): qty 1

## 2012-08-12 MED ORDER — FUROSEMIDE 10 MG/ML IJ SOLN
80.0000 mg | Freq: Two times a day (BID) | INTRAMUSCULAR | Status: DC
  Administered 2012-08-12 (×2): 80 mg via INTRAVENOUS
  Filled 2012-08-12: qty 8
  Filled 2012-08-12: qty 4
  Filled 2012-08-12 (×3): qty 8

## 2012-08-12 MED ORDER — SODIUM CHLORIDE 0.9 % IJ SOLN
3.0000 mL | Freq: Two times a day (BID) | INTRAMUSCULAR | Status: DC
  Administered 2012-08-12 – 2012-08-13 (×5): 3 mL via INTRAVENOUS

## 2012-08-12 MED ORDER — SORBITOL 70 % SOLN
30.0000 mL | Freq: Every day | Status: DC | PRN
  Administered 2012-08-14: 30 mL via ORAL
  Filled 2012-08-12 (×2): qty 30

## 2012-08-12 MED ORDER — PANTOPRAZOLE SODIUM 40 MG PO TBEC
40.0000 mg | DELAYED_RELEASE_TABLET | Freq: Every day | ORAL | Status: DC
  Administered 2012-08-12 – 2012-08-15 (×4): 40 mg via ORAL
  Filled 2012-08-12 (×4): qty 1

## 2012-08-12 MED ORDER — INSULIN ASPART 100 UNIT/ML ~~LOC~~ SOLN
0.0000 [IU] | Freq: Three times a day (TID) | SUBCUTANEOUS | Status: DC
  Administered 2012-08-12: 5 [IU] via SUBCUTANEOUS
  Administered 2012-08-12: 2 [IU] via SUBCUTANEOUS
  Administered 2012-08-12: 5 [IU] via SUBCUTANEOUS
  Administered 2012-08-13: 1 [IU] via SUBCUTANEOUS

## 2012-08-12 MED ORDER — SODIUM CHLORIDE 0.9 % IJ SOLN: 3.0000 mL | INTRAMUSCULAR | Status: DC | PRN

## 2012-08-12 MED ORDER — SODIUM CHLORIDE 0.9 % IJ SOLN
3.0000 mL | Freq: Two times a day (BID) | INTRAMUSCULAR | Status: DC
  Administered 2012-08-12 – 2012-08-15 (×4): 3 mL via INTRAVENOUS

## 2012-08-12 MED ORDER — POLYETHYLENE GLYCOL 3350 17 G PO PACK
17.0000 g | PACK | Freq: Once | ORAL | Status: AC
  Administered 2012-08-12: 17 g via ORAL
  Filled 2012-08-12: qty 1

## 2012-08-12 MED ORDER — WARFARIN - PHYSICIAN DOSING INPATIENT: Freq: Every day | Status: DC

## 2012-08-12 MED ORDER — LEVOTHYROXINE SODIUM 150 MCG PO TABS
150.0000 ug | ORAL_TABLET | Freq: Every day | ORAL | Status: DC
  Administered 2012-08-12 – 2012-08-15 (×4): 150 ug via ORAL
  Filled 2012-08-12 (×5): qty 1

## 2012-08-12 MED ORDER — WARFARIN - PHARMACIST DOSING INPATIENT: Freq: Every day | Status: DC

## 2012-08-12 MED ORDER — FLUOXETINE HCL 20 MG PO CAPS
20.0000 mg | ORAL_CAPSULE | Freq: Two times a day (BID) | ORAL | Status: DC
  Administered 2012-08-12 – 2012-08-15 (×7): 20 mg via ORAL
  Filled 2012-08-12 (×8): qty 1

## 2012-08-12 MED ORDER — WARFARIN SODIUM 5 MG PO TABS
5.0000 mg | ORAL_TABLET | Freq: Every day | ORAL | Status: DC
  Administered 2012-08-12 – 2012-08-14 (×3): 5 mg via ORAL
  Filled 2012-08-12 (×5): qty 1

## 2012-08-12 MED ORDER — COLCHICINE 0.6 MG PO TABS
0.6000 mg | ORAL_TABLET | Freq: Every day | ORAL | Status: DC | PRN
  Filled 2012-08-12: qty 1

## 2012-08-12 MED ORDER — ASPIRIN EC 81 MG PO TBEC
81.0000 mg | DELAYED_RELEASE_TABLET | Freq: Every day | ORAL | Status: DC
  Administered 2012-08-12 – 2012-08-15 (×4): 81 mg via ORAL
  Filled 2012-08-12 (×4): qty 1

## 2012-08-12 MED ORDER — TIOTROPIUM BROMIDE MONOHYDRATE 18 MCG IN CAPS
18.0000 ug | ORAL_CAPSULE | Freq: Every day | RESPIRATORY_TRACT | Status: DC
  Administered 2012-08-12 – 2012-08-15 (×4): 18 ug via RESPIRATORY_TRACT
  Filled 2012-08-12: qty 5

## 2012-08-12 MED ORDER — MOMETASONE FURO-FORMOTEROL FUM 100-5 MCG/ACT IN AERO
2.0000 | INHALATION_SPRAY | Freq: Two times a day (BID) | RESPIRATORY_TRACT | Status: DC
  Administered 2012-08-12 – 2012-08-15 (×5): 2 via RESPIRATORY_TRACT
  Filled 2012-08-12 (×2): qty 8.8

## 2012-08-12 MED ORDER — LOSARTAN POTASSIUM 50 MG PO TABS
50.0000 mg | ORAL_TABLET | Freq: Every day | ORAL | Status: DC
  Filled 2012-08-12: qty 1

## 2012-08-12 MED ORDER — ALBUTEROL SULFATE HFA 108 (90 BASE) MCG/ACT IN AERS: 2.0000 | INHALATION_SPRAY | Freq: Four times a day (QID) | RESPIRATORY_TRACT | Status: DC | PRN

## 2012-08-12 MED ORDER — GABAPENTIN 100 MG PO CAPS
100.0000 mg | ORAL_CAPSULE | Freq: Two times a day (BID) | ORAL | Status: DC
  Administered 2012-08-12 – 2012-08-15 (×7): 100 mg via ORAL
  Filled 2012-08-12 (×8): qty 1

## 2012-08-12 MED ORDER — ACETAMINOPHEN 325 MG PO TABS: 975.0000 mg | ORAL_TABLET | Freq: Two times a day (BID) | ORAL | Status: DC | PRN

## 2012-08-12 MED ORDER — SODIUM CHLORIDE 0.9 % IV SOLN: 250.0000 mL | INTRAVENOUS | Status: DC | PRN

## 2012-08-12 MED ORDER — ATORVASTATIN CALCIUM 40 MG PO TABS
40.0000 mg | ORAL_TABLET | Freq: Every day | ORAL | Status: DC
  Administered 2012-08-12 – 2012-08-15 (×4): 40 mg via ORAL
  Filled 2012-08-12 (×4): qty 1

## 2012-08-12 NOTE — Progress Notes (Signed)
Inpatient Diabetes Program Recommendations  AACE/ADA: New Consensus Statement on Inpatient Glycemic Control (2013)  Target Ranges:  Prepandial:   less than 140 mg/dL      Peak postprandial:   less than 180 mg/dL (1-2 hours)      Critically ill patients:  140 - 180 mg/dL   Hyperglycemia:   Inpatient Diabetes Program Recommendations Insulin - Basal: Please add 10-15 units lantus to regimen. Titrate until fastings are controlled under 150 mg/dL Correction (SSI): Incease to moderate tidwc. Oral Agents: Would probably benefit from meal coverage of 3-4 units Novolog tidwc rather than Glipizide while in the hosopital Thank you, Lenor Coffin, RN, CNS, Diabetes Coordinator [phone])

## 2012-08-12 NOTE — Consult Note (Signed)
Admit date: 08/11/2012 Referring Physician  Dr. Verdie Mosher Primary Physician Aletta Edouard, MD Primary Cardiologist  Dr. Armanda Magic Reason for Consultation  shortness of breath  HPI: 79 year old with mild coronary artery disease, obesity, moderate aortic stenosis 3.2 m/s, atrial fibrillation with other multiple comorbidities COPD on home oxygen with increasing shortness of breath over the past 2-3 days. She also has associated generalized discomfort. She denies any overt chest pain, diaphoresis, syncope. She may have some wheezing and possible low-grade fevers. She takes Lasix 80/40. She was recently discharged on 6/14 after a negative evaluation for chest pain.    PMH:   Past Medical History  Diagnosis Date  . Coronary atherosclerosis of native coronary artery     Mild at cateterization 2006  . Abdominal pain     Likely secondary to presbyesophagus and gastric dysmotility  . Chronic diastolic heart failure   . Respiratory failure, chronic     Mixed etiology with bronchospastic component  . Urinary incontinence     Recurrent uti's/resistance to cipro, bactrim  . Gout   . Postablative hypothyroidism     H/o Graves disease s/p radioactive iodine ablation with resultant postablative hypothyroidism  . Hyperlipidemia   . Depression   . Morbidly obese     s/p gastric plication surgery  . Gastroesophageal reflux disease   . Ventral hernia     Repair in April 2008, complicated by MRSA abdominal wall cellulitis  . DVT (deep venous thrombosis) 1998  . Pulmonary embolism 1998    Greenfield filter  . Diabetes mellitus type 2, controlled, with complications   . Essential hypertension, benign   . AF (atrial fibrillation)     on chronic warfarin  . Osteoarthritis   . Stroke 1997    Denies residual  . Anemia     Blood transfusion [V58.2]  . Supratherapeutic INR 05/15/2012    Possibly induced by recent steroid use.   . Chest pain at rest 08/07/2012    PSH:   Past Surgical History   Procedure Laterality Date  . Cholecystectomy    . Appendectomy    . Breast biopsy    . Gastric bypass  1970's  . Hernia repair  05/2006    ventral hernia  . Greenfield filter placement  1998   Allergies:  Lantus and Nitrofurantoin Prior to Admit Meds:   Prescriptions prior to admission  Medication Sig Dispense Refill  . acetaminophen (TYLENOL) 500 MG tablet Take 1,000 mg by mouth 2 (two) times daily as needed. For pain.      Marland Kitchen albuterol (PROVENTIL HFA) 108 (90 BASE) MCG/ACT inhaler Inhale 2 puffs into the lungs every 6 (six) hours as needed. For wheezing.  3.7 g  11  . amLODipine (NORVASC) 5 MG tablet Take 5 mg by mouth daily.      Marland Kitchen aspirin EC 81 MG EC tablet Take 1 tablet (81 mg total) by mouth daily.      Marland Kitchen atorvastatin (LIPITOR) 40 MG tablet Take 40 mg by mouth daily.      . colchicine 0.6 MG tablet Take 0.6 mg by mouth See admin instructions. Take for 3 days when gout flares up      . FLUoxetine (PROZAC) 20 MG capsule Take 20 mg by mouth 2 (two) times daily.      . Fluticasone-Salmeterol (ADVAIR) 250-50 MCG/DOSE AEPB Inhale 1 puff into the lungs every 12 (twelve) hours.      . furosemide (LASIX) 40 MG tablet Take 40-80 mg by mouth 2 (two) times  daily. 80 mg in the morning and 40 mg at night      . gabapentin (NEURONTIN) 100 MG capsule Take 100 mg by mouth 2 (two) times daily.      Marland Kitchen glipiZIDE (GLUCOTROL) 10 MG tablet Take 5 mg by mouth 2 (two) times daily before a meal.      . levothyroxine (SYNTHROID) 150 MCG tablet Take 1 tablet (150 mcg total) by mouth daily.  30 tablet  11  . loperamide (IMODIUM) 2 MG capsule Take 1 capsule (2 mg total) by mouth as needed for diarrhea or loose stools (up to total 16mg  daily).  8 capsule  0  . losartan (COZAAR) 50 MG tablet Take 50 mg by mouth daily.      Marland Kitchen omeprazole (PRILOSEC) 20 MG capsule Take 40 mg by mouth 2 (two) times daily.      Marland Kitchen tiotropium (SPIRIVA HANDIHALER) 18 MCG inhalation capsule Place 1 capsule (18 mcg total) into inhaler and  inhale daily.  30 capsule  4  . warfarin (COUMADIN) 5 MG tablet Take 5 mg by mouth daily.       Fam HX:    Family History  Problem Relation Age of Onset  . Colon cancer Neg Hx    Social HX:    History   Social History  . Marital Status: Widowed    Spouse Name: N/A    Number of Children: N/A  . Years of Education: N/A   Occupational History  . Not on file.   Social History Main Topics  . Smoking status: Former Smoker -- 1.00 packs/day for 1.5 years    Types: Cigarettes  . Smokeless tobacco: Never Used  . Alcohol Use: No  . Drug Use: No  . Sexually Active: No   Other Topics Concern  . Not on file   Social History Narrative   She lives w/her son. Her son offers help and she has an aid who occasionally cooks for her and takes care of some other tasks around the pt's house.   Has medicare. Has son in [place] who is POA   Ambivalent about DNR issues and does not want a STOP sign posted in home   Home Health Aid visits 6 days a week              ROS: Denies any syncope, bleeding, strokelike symptoms, dysphagia, new joint pain. Positive for obesity, generalized pain. All 11 ROS were addressed and are negative except what is stated in the HPI  Physical Exam: Blood pressure 134/60, pulse 60, temperature 98.3 F (36.8 C), temperature source Oral, resp. rate 19, height 5\' 2"  (1.575 m), weight 125.7 kg (277 lb 1.9 oz), last menstrual period 05/22/1968, SpO2 97.00%.    General: Well developed, well nourished, in no acute distress, obese Head: Eyes PERRLA, No xanthomas.   Normal cephalic and atramatic  Lungs:   Clear bilaterally to auscultation but distant breath sounds. Normal respiratory effort. No wheezes, no rales. Heart:   Irregularly irregular Pulses are 2+ & equal. 2/6 systolic murmur heard right upper sternal border but distant.            No carotid bruit. No JVD.  No abdominal bruits. No femoral bruits. Abdomen: Bowel sounds are positive, abdomen soft and non-tender without  masses. No hepatosplenomegaly. Msk:  Back normal. Normal strength and tone for age. Extremities:  No clubbing, cyanosis or edema.  DP +1 Neuro: Alert and oriented X 3, non-focal, MAE x 4 GU: Deferred Rectal: Deferred  Psych:  Good affect, responds appropriately    Labs:   Lab Results  Component Value Date   WBC 8.8 08/12/2012   HGB 9.6* 08/12/2012   HCT 30.9* 08/12/2012   MCV 88.5 08/12/2012   PLT 201 08/12/2012    Recent Labs Lab 08/06/12 0802  08/12/12 0600  NA 140  < > 137  K 4.0  < > 4.6  CL 96  < > 98  CO2 36*  < > 27  BUN 34*  < > 41*  CREATININE 1.19*  < > 1.00  CALCIUM 9.7  < > 9.8  PROT 7.2  --   --   BILITOT 0.5  --   --   ALKPHOS 110  --   --   ALT 19  --   --   AST 20  --   --   GLUCOSE 116*  < > 256*  < > = values in this interval not displayed. No results found for this basename: PTT   Lab Results  Component Value Date   INR 2.16* 08/11/2012   INR 2.16* 08/08/2012   INR 2.15* 08/07/2012   Lab Results  Component Value Date   CKTOTAL 66 05/08/2010   CKMB 2.9 05/08/2010   TROPONINI <0.30 08/12/2012     Lab Results  Component Value Date   CHOL 136 08/02/2011   CHOL  Value: 162        ATP III CLASSIFICATION:  <200     mg/dL   Desirable  161-096  mg/dL   Borderline High  >=045    mg/dL   High        06/03/8117   CHOL  Value: 133        ATP III CLASSIFICATION:  <200     mg/dL   Desirable  147-829  mg/dL   Borderline High  >=562    mg/dL   High        03/27/8655   Lab Results  Component Value Date   HDL 36* 08/02/2011   HDL 39* 05/08/2010   HDL 44 10/06/2008   Lab Results  Component Value Date   LDLCALC 62 08/02/2011   LDLCALC  Value: 94        Total Cholesterol/HDL:CHD Risk Coronary Heart Disease Risk Table                     Men   Women  1/2 Average Risk   3.4   3.3  Average Risk       5.0   4.4  2 X Average Risk   9.6   7.1  3 X Average Risk  23.4   11.0        Use the calculated Patient Ratio above and the CHD Risk Table to determine the patient's CHD Risk.         ATP III CLASSIFICATION (LDL):  <100     mg/dL   Optimal  846-962  mg/dL   Near or Above                    Optimal  130-159  mg/dL   Borderline  952-841  mg/dL   High  >324     mg/dL   Very High 05/27/270   LDLCALC  Value: 71        Total Cholesterol/HDL:CHD Risk Coronary Heart Disease Risk Table  Men   Women  1/2 Average Risk   3.4   3.3  Average Risk       5.0   4.4  2 X Average Risk   9.6   7.1  3 X Average Risk  23.4   11.0        Use the calculated Patient Ratio above and the CHD Risk Table to determine the patient's CHD Risk.        ATP III CLASSIFICATION (LDL):  <100     mg/dL   Optimal  191-478  mg/dL   Near or Above                    Optimal  130-159  mg/dL   Borderline  295-621  mg/dL   High  >308     mg/dL   Very High 6/57/8469   Lab Results  Component Value Date   TRIG 192* 08/02/2011   TRIG 145 05/08/2010   TRIG 92 10/06/2008   Lab Results  Component Value Date   CHOLHDL 3.8 08/02/2011   CHOLHDL 4.2 05/08/2010   CHOLHDL 3.0 10/06/2008   No results found for this basename: LDLDIRECT      Radiology:  Dg Chest Portable 1 View  08/11/2012   *RADIOLOGY REPORT*  Clinical Data: Shortness of breath.  PORTABLE CHEST - 1 VIEW  Comparison: Chest x-ray 08/06/2012.  Findings: Lung volumes are low.  Elevation of the right hemidiaphragm (unchanged). There is cephalization of the pulmonary vasculature, indistinctness of the interstitial markings, and patchy airspace disease throughout the lungs bilaterally suggestive of moderate pulmonary edema.  No definite pleural effusions.  Mild cardiomegaly is unchanged.  Upper mediastinal contours are within normal limits.  Atherosclerosis in the thoracic aorta.  IMPRESSION: 1.  The appearance of the chest, as above is suggestive of congestive heart failure. 2.  Atherosclerosis.   Original Report Authenticated By: Trudie Reed, M.D.   Personally viewed.  EKG: 08/12/12-difficult to determine rhythm, left bundle blanch block-like morphology.  On 08/11/12-atrial fibrillation with left bundle branch block-like morphology. Normal rate. Personally viewed.   Echocardiogram: 2/14  - Left ventricle: The cavity size was normal. There was mild focal basal hypertrophy of the septum. The estimated ejection fraction was 55%. Left ventricular diastolic function parameters were normal. - Aortic valve: Severe diffuse thickening and calcification. There was moderate stenosis. Mild regurgitation.   ASSESSMENT/PLAN:   79 year old female with moderate aortic stenosis, morbid obesity, home oxygen dependent COPD, normal ejection fraction here with worsening shortness of breath-likely multifactorial.  - Even though her echocardiogram describe normal diastolic filling parameters, it is likely that she does have a degree of diastolic dysfunction just based upon her clinical appearance/obesity/aortic stenosis which is moderate. I agree with IV furosemide 80 mg twice a day. Creatinine is currently 1.0. It is not unreasonable continue this today. If her breathing improves, she may switch to oral Lasix. Elevated BNP noted.  - Continue with aggressive lung treatment.  - On anticoagulation with Coumadin for atrial fibrillation which is currently well rate controlled.  - Aortic stenosis is moderate in severity based upon peak velocity of 3.2 m/s. She does not warrant surgical repair at this point. If in the future, her valve becomes more severe, one could consider TAVR perhaps but even this would prove to be increased risk given her multiple comorbidities. I agree with Dr. Katrinka Blazing prior consult note earlier this month.  I will relay to Dr. Mayford Knife. We will continue to follow.  [name] Stripling,  MD  08/12/2012  11:14 AM

## 2012-08-12 NOTE — ED Notes (Signed)
Admitting MD at bedside.

## 2012-08-12 NOTE — Progress Notes (Signed)
Pt says she " got into trouble" when her regular caregiver went on vacation. Her son was supposed to help her and went away. She says she is alone all night and just makes due. Says she does not want to leave her home. Uses a walker and BSC here , still has some dyspnea with activity. When pt calls for bathroom assistance most of time she has stress inc. Says she does not drive and her son buys the food and gets her a lot of canned foods and low salt soup in cans.

## 2012-08-12 NOTE — Evaluation (Signed)
Physical Therapy Evaluation Patient Details Name: [name] MRN: [id number] DOB: 01-24-1933 Today's Date: 08/12/2012 Time: 1191-4782 PT Time Calculation (min): 30 min  PT Assessment / Plan / Recommendation Clinical Impression  pt admitted with 2 day h/o SOB found to be due to Acute on Chronic CHF. Pt is significantly dyspneic on exertion, but recovers relatively fast on 4 L [place] at rest.    PT Assessment  Patient needs continued PT services    Follow Up Recommendations  Home health PT;Other (comment) (if get back to her baseline otherwise SNF for rehab.)    Does the patient have the potential to tolerate intense rehabilitation      Barriers to Discharge Decreased caregiver support      Equipment Recommendations  None recommended by PT    Recommendations for Other Services     Frequency Min 3X/week    Precautions / Restrictions Precautions Precautions: Fall Restrictions Weight Bearing Restrictions: No   Pertinent Vitals/Pain Significantly dyspneic, not sats taken       Mobility  Bed Mobility Bed Mobility: Not assessed Transfers Transfers: Sit to Stand;Stand to Sit Sit to Stand: 4: Min guard;With upper extremity assist;From chair/3-in-1 Stand to Sit: 4: Min guard;With upper extremity assist;To chair/3-in-1 Details for Transfer Assistance: generally safe, but effortful Ambulation/Gait Ambulation/Gait Assistance: 4: Min guard Ambulation Distance (Feet): 75 Feet Assistive device: Rolling walker Ambulation/Gait Assistance Details: mildly unsteady with less than safe use of RW--tends to stay behind RW.  Dyspneic and unable to talk during amb. Gait Pattern: Step-through pattern Stairs: No    Exercises     PT Diagnosis: Difficulty walking;Generalized weakness;Other (comment) (decr activity tolerance)  PT Problem List: Decreased strength;Decreased activity tolerance;Decreased mobility;Cardiopulmonary status limiting activity;Decreased balance PT Treatment  Interventions: Gait training;Stair training;Functional mobility training;Therapeutic activities;Patient/family education;Balance training   PT Goals Acute Rehab PT Goals PT Goal Formulation: With patient Time For Goal Achievement: 08/26/12 Potential to Achieve Goals: Fair Pt will go Supine/Side to Sit: Independently;with HOB 0 degrees PT Goal: Supine/Side to Sit - Progress: Goal set today Pt will go Sit to Stand: with modified independence PT Goal: Sit to Stand - Progress: Goal set today Pt will Transfer Bed to Chair/Chair to Bed: with modified independence PT Transfer Goal: Bed to Chair/Chair to Bed - Progress: Goal set today Pt will Ambulate: 51 - 150 feet;with modified independence;with least restrictive assistive device PT Goal: Ambulate - Progress: Goal set today Pt will Go Up / Down Stairs: 1-2 stairs;with supervision;with rail(s) PT Goal: Up/Down Stairs - Progress: Goal set today  Visit Information  Last PT Received On: 08/12/12 Assistance Needed: +1    Subjective Data  Subjective: My son is never there... I have a helper...she just talks on the phone. Patient Stated Goal: pt never stated a goal... she does not want an alternative living arrangement   Prior Functioning  Home Living Lives With: Son (who is never around) Available Help at Discharge: Personal care attendant (from 9:30 to 14:00, otherwise no assist) Type of Home: House Home Access: Stairs to enter [employer] of Steps: 2 Home Layout: One level Bathroom Shower/Tub: Forensic scientist: Standard Home Adaptive Equipment: Bedside commode/3-in-1;Shower chair with back;Walker - rolling Prior Function Level of Independence: Independent with assistive device(s) (for basic house hold ambulation only) Driving: No Communication Communication: No difficulties    Cognition  Cognition Arousal/Alertness: Awake/alert Behavior During Therapy: WFL for tasks assessed/performed Overall  Cognitive Status: Within Functional Limits for tasks assessed    Extremity/Trunk Assessment Right Lower Extremity  Assessment RLE ROM/Strength/Tone: Deficits;WFL for tasks assessed RLE ROM/Strength/Tone Deficits: grossly 4/5 Left Lower Extremity Assessment LLE ROM/Strength/Tone: Deficits;WFL for tasks assessed LLE ROM/Strength/Tone Deficits: grossly 4/5 Trunk Assessment Trunk Assessment: Normal   Balance Balance Balance Assessed: No  End of Session PT - End of Session Equipment Utilized During Treatment: Oxygen Activity Tolerance: Patient tolerated treatment well;Patient limited by fatigue Patient left: in chair;with call bell/phone within reach Nurse Communication: Mobility status  GP     Kentarius Partington, Eliseo Gum 08/12/2012, 3:08 PM

## 2012-08-12 NOTE — Telephone Encounter (Signed)
Yes, I agree. Thank you!

## 2012-08-12 NOTE — H&P (Signed)
Date: 08/12/2012               Patient Name:  [name] MRN: [id number]  DOB: [birth year] Age / Sex: 79 y.o., female   PCP: Aletta Edouard, MD              Medical Service: Internal Medicine Teaching Service              Attending Physician: Dr. Jonah Blue, DO    First Contact: Dr. Collier Bullock Pager: [phone]  Second Contact: Dr. Clyde Lundborg Pager: [phone]            After Hours (After 5p/  First Contact Pager: [phone]  weekends / holidays): Second Contact Pager: [phone]   Chief Complaint: Shortness of breath for 2 days  History of Present Illness: [name] is a 79 year old woman with PMH of DM2, hyperlipidemia, HTN, A. Fib on warfarin, history of PE, CHF (EF of 55% per 2D echo on 04/21/12 ), CAD (MI 20 yrs ago, cath in 2006 with nonobstructive disease and 30-40% stenosis on circumflex), moderate aortic stenosis (mild regurgitation, 0.57cm^2 area per last 2D echo), CVA (with no residual deficit), and COPD on 4L home oxygen who presents to the [facility] ED via EMS with increasing shortness of breath.   For the past 2 days, she's been experiencing progressive shortness of breath and general discomfort. She reports that she spent the day prior to admission seated up in the chair, but she is unsure whether her orthopnea has worsened. No increased leg swelling but she thinks that her abdomen is more distended. She denies overt chest pain, palpitations, diaphoresis or syncope/dizziness. She also denies cough and increased sputum production but she admits to some wheezing. She also admits to subjective low-grade fevers with increased fatigue. She describes her appetite as 'so-so', without nausea or vomiting.  She has not increased the use of her of inhalers and she is using the same amount of oxygen at home. She denies dietary indiscretions and she has been compliant with her regular medications including Lasix 80 mg AM and 40 mg PM.   In the ED she had transient desaturation to 77-88%, she was put  on NRB and improved to 99%. As per RT, her nasal canula oxygen was off during that episode. At our evaluation she was saturating in the 93-98 on 4L by [place].  She was discharged from [facility] on 08/08/2012 after a negative evaluation for chest pain. Since returning home she only had ''very few good days''.   Meds:  Current Outpatient Prescriptions  Medication Sig  . acetaminophen 1,000 mg po bid prn  . albuterol inhaler Inhale 2 puffs q6hrs prn  . amLODipine   5 mg daily.  Marland Kitchen aspirin 81 mg daily  . atorvastatin 40 mg daily.  . colchicine 0.6 MG tablet  0.6 mg for 3 days prn forgout flares up  . FLUoxetine 20 MG capsule 20 mg bid   . Fluticasone-Salmeterol (ADVAIR) 250-50 MCG/DOSE AEPB Inhale 1 puff into the lungs every 12 (twelve) hours.  . furosemide (LASIX) 40 MG tablet Take 40-80 mg by mouth 2 (two) times daily. 80 mg in the morning and 40 mg at night  . gabapentin (NEURONTIN) 100 MG capsule Take 100 mg by mouth 2 (two) times daily.  Marland Kitchen glipiZIDE (GLUCOTROL) 10 MG tablet Take 5 mg by mouth 2 (two) times daily before a meal.  . levothyroxine (SYNTHROID) 150 MCG tablet Take 1 tablet (150 mcg total) by mouth daily.  Marland Kitchen  loperamide (IMODIUM) 2 MG capsule Take 1 capsule (2 mg total) by mouth as needed for diarrhea or loose stools (up to total 16mg  daily).  Marland Kitchen losartan (COZAAR) 50 MG tablet Take 50 mg by mouth daily.  Marland Kitchen omeprazole (PRILOSEC) 20 MG capsule Take 40 mg by mouth 2 (two) times daily.  Marland Kitchen tiotropium (SPIRIVA HANDIHALER) 18 MCG inhalation capsule Place 1 capsule (18 mcg total) into inhaler and inhale daily.  Marland Kitchen warfarin (COUMADIN) 5 MG tablet Take 5 mg by mouth daily.    Allergies: Allergies as of 08/11/2012 - Review Complete 08/11/2012  Allergen Reaction Noted  . Lantus (insulin glargine)  05/13/2012  . Nitrofurantoin (macrodantin) Itching 04/20/2010   Past Medical History  Diagnosis Date  . Coronary atherosclerosis of native coronary artery     Mild at cateterization 2006  . Abdominal  pain     Likely secondary to presbyesophagus and gastric dysmotility  . Chronic diastolic heart failure   . Respiratory failure, chronic     Mixed etiology with bronchospastic component  . Urinary incontinence     Recurrent uti's/resistance to cipro, bactrim  . Gout   . Postablative hypothyroidism     H/o Graves disease s/p radioactive iodine ablation with resultant postablative hypothyroidism  . Hyperlipidemia   . Depression   . Morbidly obese     s/p gastric plication surgery  . Gastroesophageal reflux disease   . Ventral hernia     Repair in April 2008, complicated by MRSA abdominal wall cellulitis  . DVT (deep venous thrombosis) 1998  . Pulmonary embolism 1998    Greenfield filter  . Diabetes mellitus type 2, controlled, with complications   . Essential hypertension, benign   . AF (atrial fibrillation)     on chronic warfarin  . Osteoarthritis   . Stroke 1997    Denies residual  . Anemia     Blood transfusion [V58.2]  . Supratherapeutic INR 05/15/2012    Possibly induced by recent steroid use.   . Chest pain at rest 08/07/2012   Past Surgical History  Procedure Laterality Date  . Cholecystectomy    . Appendectomy    . Breast biopsy    . Gastric bypass  1970's  . Hernia repair  05/2006    ventral hernia  . [place] filter placement  1998   Family History  Problem Relation Age of Onset  . Colon cancer Neg Hx    History   Social History  . Marital Status: Widowed    Spouse Name: N/A    Number of Children: N/A  . Years of Education: N/A   Occupational History  . Not on file.   Social History Main Topics  . Smoking status: Former Smoker -- 1.00 packs/day for 1.5 years    Types: Cigarettes  . Smokeless tobacco: Never Used  . Alcohol Use: No  . Drug Use: No  . Sexually Active: No   Other Topics Concern  . Not on file   Social History Narrative   She lives w/her son. Her son offers help and she has an aid who occasionally cooks for her and takes care  of some other tasks around the pt's house.   Has medicare. Has son in [place] who is POA   Ambivalent about DNR issues and does not want a STOP sign posted in home   Home Health Aid visits 6 days a week             Review of Systems: Constitutional: Denies fever, chills,  diaphoresis, appetite change and fatigue.  HEENT: Denies photophobia, eye pain, redness, hearing loss, ear pain, congestion, sore throat, rhinorrhea, sneezing, mouth sores, trouble swallowing, neck pain, neck stiffness and tinnitus. Gastrointestinal: Denies nausea, vomiting, abdominal pain,or diarrhea. Reports constipation, which improves with her regular laxatives. No history of blood in stool and abdominal distention.  Genitourinary: Denies dysuria, urgency, frequency, hematuria, flank pain and difficulty urinating.  Musculoskeletal: Denies myalgias, back pain, joint swelling, arthralgias and gait problem.  Skin: Denies pallor, rash and wound.  Neurological: Denies seizures, focal weakness, numbness and headaches.  Hematological: Denies adenopathy. Easy bruising, personal or family bleeding history  Psychiatric/Behavioral: Denies suicidal ideation, mood changes, confusion, nervousness, sleep disturbance and agitation  Physical Exam: BP 148/45 PR 73, RR 22, SpO2 99% on 4L of [place] ooxygen, Wt 273Lb General: Morbidly obese, well nourished; moderate acute distressed, cooperative with exam  Head: atraumatic, normocephalic,  Eye: pupils equal, round and reactive; sclera anicteric; normal conjunctiva  Nose/throat: oropharynx clear, moist mucous membranes, pink gums  Neck: supple, no carotid bruits, thyroid normal in size and without palpable nodules  Lymph nodes: no cervical or supraclavicular lymphadenopathy  Lungs/Chest wall: Bibasilar course breath sounds. Heart: Irregular heart rate; no murmurs. No appreciable JVD  Pulses: radial and dorsalis pedis pulses are 2+ and symmetric  Abdomen: Distended abdomen (in morbidly obese pt),  no rebound, guarding, or rigidity; normal bowel sounds; no masses or organomegaly  Skin: warm, dry, intact, normal turgor, no rashes  Extremities: no peripheral edema, clubbing, or cyanosis  Neurologic: A&O X3, CN II - XII are grossly intact. Motor strength is 5/5 in the all 4 extremities, Sensations intact to light touch.  Psych: patient is alert and oriented, mood and affect are normal and congruent, thought content is normal without delusions, thought process is linear, speech is normal and non-pressured, behavior is normal  Lab results: Basic Metabolic Panel:  Recent Labs  13/08/65 2240  NA 138  K 4.5  CL 98  CO2 32  GLUCOSE 177*  BUN 43*  CREATININE 1.12*  CALCIUM 10.3   CBC:  Recent Labs  08/11/12 2240  WBC 12.2*  NEUTROABS 10.2*  HGB 10.1*  HCT 33.1*  MCV 89.0  PLT 251   BNP:  Recent Labs Lab 08/06/12 0126 08/11/12 2241  PROBNP 1544.0* 2413.0*    Coagulation:  Recent Labs  08/11/12 2240  LABPROT 23.2*  INR 2.16*   Misc. Labs:   Imaging results:  Dg Chest Portable 1 View  08/11/2012   *RADIOLOGY REPORT*  Clinical Data: Shortness of breath.  PORTABLE CHEST - 1 VIEW  Comparison: Chest x-ray 08/06/2012.  Findings: Lung volumes are low.  Elevation of the right hemidiaphragm (unchanged). There is cephalization of the pulmonary vasculature, indistinctness of the interstitial markings, and patchy airspace disease throughout the lungs bilaterally suggestive of moderate pulmonary edema.  No definite pleural effusions.  Mild cardiomegaly is unchanged.  Upper mediastinal contours are within normal limits.  Atherosclerosis in the thoracic aorta.  IMPRESSION: 1.  The appearance of the chest, as above is suggestive of congestive heart failure. 2.  Atherosclerosis.   Original Report Authenticated By: Trudie Reed, M.D.    Other results: EKG: unchanged from previous tracings, atrial fibrillation, rate 72, Q waves in lead II, III, with 3, aVF, V1, V2, V3 and V4.  LAD. No ST segment depressions or elevations. No changes.    Assessment & Plan by Problem: Principal Problem:   Acute on chronic diastolic CHF (congestive heart failure) Active Problems:  HYPOTHYROIDISM   DIABETES MELLITUS   HYPERLIPIDEMIA   OBESITY, MORBID   DEPRESSION   HYPERTENSION   Atrial fibrillation   COPD   CKD (chronic kidney disease) stage 3, GFR 30-59 ml/min   CAD (coronary artery disease)   Aortic stenosis  Ms. PICCOLA ARICO is a 79 yo F with history of COPD, dCHF, afib on coumadin, and stage 2 CKD admitted to [facility] on 08/12/12 due to worsening acute on chronic SOB.   #SOB: Improved since arriving at ED.  Most likely acute dCHF exacerbation given exam, elevated pBNP in setting of stable renal function, CXR and improvement with IV lasix, though curious that weight is only up 1 lb since discharge. Unclear precipitant as she reports compliance with meds and denies dietary indiscretions. Will need to rule out ACS. Most recent TSH wnl on 06/26/12. She has history of mod-severe AS based on echo in 03/2012. Signs & symptoms not typical of PNA or COPD exacerbation, though she has a mild leukocytosis. Unlikely PE given therapeutic anticoagulation. May require increased lasix in setting of CKD.  -Lasix 80mg  IV BID (2.5x home dose)- monitor closely given preload dependence due to AS  -Cycle CE (initial istat negative)  -Strict I/Os and daily weights  -Consider repeat echo given obesity and re-admission; at last admission, cardiology [facility]) felt AS was not critical and suggested following longitudinally and consider TAVR   #Stage 3 CKD: Stable (GFR 46)  -Losartan 50mg  daily, Lasix as above   #COPD: Stable, at baseline. On 4L home O2. Clinical presentation not typical of COPD exacerbation.  Plan  -Continue O2  -Continue albuterol prn  -Substitute Dulera for Advair (formulary)   #Atrial Fibrillation: with h/o sick sinus syndrome with recurrent bradycardia and intolerance to beta  blockers. Asymptomatic. On chronic anticoagulation with therapeutic INR.  Plan  -Coumadin per pharmacy   #Normocytic Anemia: Baseline Hb seems to be around 11-12. Hb 10.1 at admission, stable from hospital discharge one week prior.  -AM CBC   #HTN: Stable.  -Continue amlodipine 5, Losartan 50, & lasix as above   #Hypothyroidism: TSH 4.144 (wnl) on 06/26/12.  -Continue home Levothyroxine 150 mcg daily   #DM: A1c 6.4 on 06/26/12. At home on glipizide 5mg  dialy.  -Hold home glipizide  -Moderate SSI with CBGs ACHS  -Continue Gabapentin   #Depression: Stable without SI/HI.  -Continue Prozac   #VTE ppx: Anticoagulation (coumadin)   #Code Status: Full code  Dispo: Disposition is deferred at this time, awaiting improvement of current medical problems. Anticipated discharge in approximately 2-3 day(s).   The patient does have a current PCP Aletta Edouard, MD), therefore will be require OPC follow-up after discharge.   The patient does not have transportation limitations that hinder transportation to clinic appointments.   Signed:  Dow Adolph, MD PGY-1 Internal Medicine Teaching Service Pager: [phone] (7pm-7am) 08/12/2012, 3:23 AM

## 2012-08-12 NOTE — H&P (Signed)
Date: 08/12/2012  Patient name: [name]  Medical record number: [id number]  Date of birth: 1933-02-12   I have seen and evaluated [name] and discussed their care with the Residency Team. 79 yr old WF w/ HTN, Afib on coumadin, likely HFPEF, CAD, moderate AS, hx CVA, O2 dependent COPD, presented with worsening SOB at rest and with exertion over previous few days. She admitted to some generalized chest discomfort. She admits she was taking all of her medications.  She states she has not felt any better since being discharged on 08/08/12.    Physical Exam: Blood pressure 132/62, pulse 60, temperature 98.3 F (36.8 C), temperature source Oral, resp. rate 19, height 5\' 2"  (1.575 m), weight 277 lb 1.9 oz (125.7 kg), last menstrual period 05/22/1968, SpO2 97.00%. BP 132/62  Pulse 60  Temp(Src) 98.3 F (36.8 C) (Oral)  Resp 19  Ht 5\' 2"  (1.575 m)  Wt 277 lb 1.9 oz (125.7 kg)  BMI 50.67 kg/m2  SpO2 97%  LMP 05/22/1968 General appearance: alert, cooperative and appears stated age Head: Normocephalic, without obvious abnormality, atraumatic Eyes: conjunctivae/corneas clear. PERRL, EOM's intact. Fundi benign. Lungs: clear to auscultation bilaterally Heart: irregularly irregular rhythm, S1: normal, S2: decreased intensity and systolic murmur: holosystolic 3/6, crescendo and decrescendo at 2nd right intercostal space Abdomen: soft, non-tender; bowel sounds normal; no masses,  no organomegaly Extremities: extremities normal, atraumatic, no cyanosis or edema Pulses: 2+ and symmetric Neurologic: Alert and oriented X 3, normal strength and tone. Normal symmetric reflexes. Normal coordination and gait  Lab results: Results for orders placed during the hospital encounter of 08/11/12 (from the past 24 hour(s))  CBC     Status: Abnormal   Collection Time    08/11/12 10:40 PM      Result Value Range   WBC 12.2 (*) 4.0 - 10.5 K/uL   RBC 3.72 (*) 3.87 - 5.11 MIL/uL   Hemoglobin 10.1 (*)  12.0 - 15.0 g/dL   HCT 40.9 (*) 81.1 - 91.4 %   MCV 89.0  78.0 - 100.0 fL   MCH 27.2  26.0 - 34.0 pg   MCHC 30.5  30.0 - 36.0 g/dL   RDW 78.2 (*) 95.6 - 21.3 %   Platelets 251  150 - 400 K/uL  BASIC METABOLIC PANEL     Status: Abnormal   Collection Time    08/11/12 10:40 PM      Result Value Range   Sodium 138  135 - 145 mEq/L   Potassium 4.5  3.5 - 5.1 mEq/L   Chloride 98  96 - 112 mEq/L   CO2 32  19 - 32 mEq/L   Glucose, Bld 177 (*) 70 - 99 mg/dL   BUN 43 (*) 6 - 23 mg/dL   Creatinine, Ser 0.86 (*) 0.50 - 1.10 mg/dL   Calcium 57.8  8.4 - 46.9 mg/dL   GFR calc non Af Amer 46 (*) >90 mL/min   GFR calc Af Amer 53 (*) >90 mL/min  PROTIME-INR     Status: Abnormal   Collection Time    08/11/12 10:40 PM      Result Value Range   Prothrombin Time 23.2 (*) 11.6 - 15.2 seconds   INR 2.16 (*) 0.00 - 1.49  DIFFERENTIAL     Status: Abnormal   Collection Time    08/11/12 10:40 PM      Result Value Range   Neutrophils Relative % 84 (*) 43 - 77 %   Neutro Abs 10.2 (*)  1.7 - 7.7 K/uL   Lymphocytes Relative 9 (*) 12 - 46 %   Lymphs Abs 1.1  0.7 - 4.0 K/uL   Monocytes Relative 6  3 - 12 %   Monocytes Absolute 0.7  0.1 - 1.0 K/uL   Eosinophils Relative 1  0 - 5 %   Eosinophils Absolute 0.1  0.0 - 0.7 K/uL   Basophils Relative 0  0 - 1 %   Basophils Absolute 0.0  0.0 - 0.1 K/uL  PRO B NATRIURETIC PEPTIDE     Status: Abnormal   Collection Time    08/11/12 10:41 PM      Result Value Range   Pro B Natriuretic peptide (BNP) 2413.0 (*) 0 - 450 pg/mL  POCT I-STAT TROPONIN I     Status: None   Collection Time    08/11/12 11:07 PM      Result Value Range   Troponin i, poc 0.00  0.00 - 0.08 ng/mL   Comment 3           GLUCOSE, CAPILLARY     Status: Abnormal   Collection Time    08/12/12  5:58 AM      Result Value Range   Glucose-Capillary 270 (*) 70 - 99 mg/dL  CBC     Status: Abnormal   Collection Time    08/12/12  6:00 AM      Result Value Range   WBC 8.8  4.0 - 10.5 K/uL   RBC  3.49 (*) 3.87 - 5.11 MIL/uL   Hemoglobin 9.6 (*) 12.0 - 15.0 g/dL   HCT 16.1 (*) 09.6 - 04.5 %   MCV 88.5  78.0 - 100.0 fL   MCH 27.5  26.0 - 34.0 pg   MCHC 31.1  30.0 - 36.0 g/dL   RDW 40.9 (*) 81.1 - 91.4 %   Platelets 201  150 - 400 K/uL  BASIC METABOLIC PANEL     Status: Abnormal   Collection Time    08/12/12  6:00 AM      Result Value Range   Sodium 137  135 - 145 mEq/L   Potassium 4.6  3.5 - 5.1 mEq/L   Chloride 98  96 - 112 mEq/L   CO2 27  19 - 32 mEq/L   Glucose, Bld 256 (*) 70 - 99 mg/dL   BUN 41 (*) 6 - 23 mg/dL   Creatinine, Ser 7.82  0.50 - 1.10 mg/dL   Calcium 9.8  8.4 - 95.6 mg/dL   GFR calc non Af Amer 53 (*) >90 mL/min   GFR calc Af Amer 61 (*) >90 mL/min  TROPONIN I     Status: None   Collection Time    08/12/12  6:00 AM      Result Value Range   Troponin I <0.30  <0.30 ng/mL  MAGNESIUM     Status: None   Collection Time    08/12/12  6:00 AM      Result Value Range   Magnesium 2.1  1.5 - 2.5 mg/dL  TROPONIN I     Status: None   Collection Time    08/12/12 10:40 AM      Result Value Range   Troponin I <0.30  <0.30 ng/mL  GLUCOSE, CAPILLARY     Status: Abnormal   Collection Time    08/12/12 11:22 AM      Result Value Range   Glucose-Capillary 277 (*) 70 - 99 mg/dL   Comment 1 Notify RN  Imaging results:  Dg Chest Portable 1 View  08/11/2012   *RADIOLOGY REPORT*  Clinical Data: Shortness of breath.  PORTABLE CHEST - 1 VIEW  Comparison: Chest x-ray 08/06/2012.  Findings: Lung volumes are low.  Elevation of the right hemidiaphragm (unchanged). There is cephalization of the pulmonary vasculature, indistinctness of the interstitial markings, and patchy airspace disease throughout the lungs bilaterally suggestive of moderate pulmonary edema.  No definite pleural effusions.  Mild cardiomegaly is unchanged.  Upper mediastinal contours are within normal limits.  Atherosclerosis in the thoracic aorta.  IMPRESSION: 1.  The appearance of the chest, as above is  suggestive of congestive heart failure. 2.  Atherosclerosis.   Original Report Authenticated By: Trudie Reed, M.D.    Assessment and Plan: I have seen and evaluated the patient as outlined above. I agree with the formulated Assessment and Plan as detailed in the residents' admission note, with the following changes:  1. HFPEF: Agree with diuresis with 80 mg IV lasix bid. Watch BP. Hold ACE-i.  Cardiology following. Not thought to warrant AoV replacement at this time. 2. Afib: Rate controlled. Watch K, Mg. Cont current therapy. -rest per resident note.  Jonah Blue, [place] 6/18/20142:33 PM

## 2012-08-12 NOTE — Progress Notes (Signed)
ANTICOAGULATION CONSULT NOTE - Initial Consult  Pharmacy Consult for Coumadin Indication: h/o PE/DVT  Allergies  Allergen Reactions  . Lantus (Insulin Glargine)     Causes a lot of itching  . Nitrofurantoin (Macrodantin) Itching    Patient Measurements: Height: 5\' 2"  (157.5 cm) Weight: 273 lb 9.5 oz (124.1 kg) (Bed Scale) IBW/kg (Calculated) : 50.1  Vital Signs: Temp: 98.3 F (36.8 C) (06/18 0233) Temp src: Oral (06/18 0233) BP: 147/69 mmHg (06/18 0233) Pulse Rate: 91 (06/18 0233)  Labs:  Recent Labs  08/11/12 2240  HGB 10.1*  HCT 33.1*  PLT 251  LABPROT 23.2*  INR 2.16*  CREATININE 1.12*    Estimated Creatinine Clearance: 52.1 ml/min (by C-G formula based on Cr of 1.12).   Medical History: Past Medical History  Diagnosis Date  . Coronary atherosclerosis of native coronary artery     Mild at cateterization 2006  . Abdominal pain     Likely secondary to presbyesophagus and gastric dysmotility  . Chronic diastolic heart failure   . Respiratory failure, chronic     Mixed etiology with bronchospastic component  . Urinary incontinence     Recurrent uti's/resistance to cipro, bactrim  . Gout   . Postablative hypothyroidism     H/o Graves disease s/p radioactive iodine ablation with resultant postablative hypothyroidism  . Hyperlipidemia   . Depression   . Morbidly obese     s/p gastric plication surgery  . Gastroesophageal reflux disease   . Ventral hernia     Repair in April 2008, complicated by MRSA abdominal wall cellulitis  . DVT (deep venous thrombosis) 1998  . Pulmonary embolism 1998    [place] filter  . Diabetes mellitus type 2, controlled, with complications   . Essential hypertension, benign   . AF (atrial fibrillation)     on chronic warfarin  . Osteoarthritis   . Stroke 1997    Denies residual  . Anemia     Blood transfusion [V58.2]  . Supratherapeutic INR 05/15/2012    Possibly induced by recent steroid use.   . Chest pain at rest  08/07/2012    Medications:  Prescriptions prior to admission  Medication Sig Dispense Refill  . acetaminophen (TYLENOL) 500 MG tablet Take 1,000 mg by mouth 2 (two) times daily as needed. For pain.      Marland Kitchen albuterol (PROVENTIL HFA) 108 (90 BASE) MCG/ACT inhaler Inhale 2 puffs into the lungs every 6 (six) hours as needed. For wheezing.  3.7 g  11  . amLODipine (NORVASC) 5 MG tablet Take 5 mg by mouth daily.      Marland Kitchen aspirin EC 81 MG EC tablet Take 1 tablet (81 mg total) by mouth daily.      Marland Kitchen atorvastatin (LIPITOR) 40 MG tablet Take 40 mg by mouth daily.      . colchicine 0.6 MG tablet Take 0.6 mg by mouth See admin instructions. Take for 3 days when gout flares up      . FLUoxetine (PROZAC) 20 MG capsule Take 20 mg by mouth 2 (two) times daily.      . Fluticasone-Salmeterol (ADVAIR) 250-50 MCG/DOSE AEPB Inhale 1 puff into the lungs every 12 (twelve) hours.      . furosemide (LASIX) 40 MG tablet Take 40-80 mg by mouth 2 (two) times daily. 80 mg in the morning and 40 mg at night      . gabapentin (NEURONTIN) 100 MG capsule Take 100 mg by mouth 2 (two) times daily.      Marland Kitchen  glipiZIDE (GLUCOTROL) 10 MG tablet Take 5 mg by mouth 2 (two) times daily before a meal.      . levothyroxine (SYNTHROID) 150 MCG tablet Take 1 tablet (150 mcg total) by mouth daily.  30 tablet  11  . loperamide (IMODIUM) 2 MG capsule Take 1 capsule (2 mg total) by mouth as needed for diarrhea or loose stools (up to total 16mg  daily).  8 capsule  0  . losartan (COZAAR) 50 MG tablet Take 50 mg by mouth daily.      Marland Kitchen omeprazole (PRILOSEC) 20 MG capsule Take 40 mg by mouth 2 (two) times daily.      Marland Kitchen tiotropium (SPIRIVA HANDIHALER) 18 MCG inhalation capsule Place 1 capsule (18 mcg total) into inhaler and inhale daily.  30 capsule  4  . warfarin (COUMADIN) 5 MG tablet Take 5 mg by mouth daily.        Assessment: 79 yo female admitted with SOB, h/o VTE, to continue Coumadin  Goal of Therapy:  INR 2-3 Monitor platelets by  anticoagulation protocol: Yes   Plan:  Continue home Coumadin regimen Daily INR  Nazifa Trinka, Gary Fleet 08/12/2012,3:59 AM

## 2012-08-12 NOTE — Progress Notes (Signed)
Patient admitted to 4700 via stretcher.  Patient resting comfortably in no acute distress. RN will continue to monitor. Louretta Parma, RN

## 2012-08-12 NOTE — Progress Notes (Signed)
Utilization Review Completed.   Giordana Weinheimer, RN, BSN Nurse Case Manager  [phone]  

## 2012-08-13 DIAGNOSIS — E119 Type 2 diabetes mellitus without complications: Secondary | ICD-10-CM

## 2012-08-13 LAB — BASIC METABOLIC PANEL
BUN: 49 mg/dL — ABNORMAL HIGH (ref 6–23)
CO2: 34 mEq/L — ABNORMAL HIGH (ref 19–32)
Chloride: 97 mEq/L (ref 96–112)
Creatinine, Ser: 1.14 mg/dL — ABNORMAL HIGH (ref 0.50–1.10)
Glucose, Bld: 137 mg/dL — ABNORMAL HIGH (ref 70–99)

## 2012-08-13 LAB — PROTIME-INR: INR: 2.16 — ABNORMAL HIGH (ref 0.00–1.49)

## 2012-08-13 LAB — GLUCOSE, CAPILLARY
Glucose-Capillary: 145 mg/dL — ABNORMAL HIGH (ref 70–99)
Glucose-Capillary: 167 mg/dL — ABNORMAL HIGH (ref 70–99)
Glucose-Capillary: 117 mg/dL — ABNORMAL HIGH (ref 70–99)

## 2012-08-13 MED ORDER — FUROSEMIDE 10 MG/ML IJ SOLN
80.0000 mg | Freq: Two times a day (BID) | INTRAMUSCULAR | Status: AC
  Administered 2012-08-13 (×2): 80 mg via INTRAVENOUS
  Filled 2012-08-13: qty 8

## 2012-08-13 MED ORDER — INSULIN ASPART 100 UNIT/ML ~~LOC~~ SOLN
0.0000 [IU] | Freq: Three times a day (TID) | SUBCUTANEOUS | Status: DC
  Administered 2012-08-13: 3 [IU] via SUBCUTANEOUS
  Administered 2012-08-14: 2 [IU] via SUBCUTANEOUS
  Administered 2012-08-14: 3 [IU] via SUBCUTANEOUS
  Administered 2012-08-14: 2 [IU] via SUBCUTANEOUS
  Administered 2012-08-15: 3 [IU] via SUBCUTANEOUS
  Administered 2012-08-15: 2 [IU] via SUBCUTANEOUS

## 2012-08-13 MED ORDER — INSULIN ASPART 100 UNIT/ML ~~LOC~~ SOLN
3.0000 [IU] | Freq: Three times a day (TID) | SUBCUTANEOUS | Status: DC
  Administered 2012-08-13 – 2012-08-15 (×6): 3 [IU] via SUBCUTANEOUS

## 2012-08-13 MED ORDER — INSULIN DETEMIR 100 UNIT/ML ~~LOC~~ SOLN
10.0000 [IU] | Freq: Every day | SUBCUTANEOUS | Status: DC
  Administered 2012-08-13 – 2012-08-15 (×3): 10 [IU] via SUBCUTANEOUS
  Filled 2012-08-13 (×4): qty 0.1

## 2012-08-13 NOTE — Progress Notes (Signed)
Patient brady to 33, non-sustaining.  MD on call notified. Patient asymptomatic and resting quietly in room. No new orders placed. RN will continue to monitor.  Louretta Parma, RN

## 2012-08-13 NOTE — Progress Notes (Signed)
SUBJECTIVE:  Still SOB but improved  OBJECTIVE:   Vitals:   Filed Vitals:   08/13/12 0902 08/13/12 1011 08/13/12 1452 08/13/12 1514  BP:  128/66 132/70 169/60  Pulse:  56 55 56  Temp:   98 F (36.7 C) 98.1 F (36.7 C)  TempSrc:   Oral Oral  Resp:   19 18  Height:      Weight:      SpO2: 96%  97% 96%   I&O's:   Intake/Output Summary (Last 24 hours) at 08/13/12 1654 Last data filed at 08/13/12 1502  Gross per 24 hour  Intake   1280 ml  Output      0 ml  Net   1280 ml   TELEMETRY: Reviewed telemetry pt in atrial fib     PHYSICAL EXAM General: Well developed, well nourished, in no acute distress Head: Eyes PERRLA, No xanthomas.   Normal cephalic and atramatic  Lungs:   Few crackles at bases Heart:   HRRR S1 S2 Pulses are 2+ & equal. Abdomen: Bowel sounds are positive, abdomen soft and non-tender without masses Extremities:   No clubbing, cyanosis or edema.  DP +1 Neuro: Alert and oriented X 3. Psych:  Good affect, responds appropriately   LABS: Basic Metabolic Panel:  Recent Labs  16/10/96 0600 08/13/12 0750  NA 137 138  K 4.6 4.1  CL 98 97  CO2 27 34*  GLUCOSE 256* 137*  BUN 41* 49*  CREATININE 1.00 1.14*  CALCIUM 9.8 9.8  MG 2.1 2.1   Liver Function Tests: No results found for this basename: AST, ALT, ALKPHOS, BILITOT, PROT, ALBUMIN,  in the last 72 hours No results found for this basename: LIPASE, AMYLASE,  in the last 72 hours CBC:  Recent Labs  08/11/12 2240 08/12/12 0600  WBC 12.2* 8.8  NEUTROABS 10.2*  --   HGB 10.1* 9.6*  HCT 33.1* 30.9*  MCV 89.0 88.5  PLT 251 201   Cardiac Enzymes:  Recent Labs  08/12/12 0600 08/12/12 1040  TROPONINI <0.30 <0.30   Coag Panel:   Lab Results  Component Value Date   INR 2.16* 08/13/2012   INR 2.16* 08/11/2012   INR 2.16* 08/08/2012    RADIOLOGY: Dg Chest Portable 1 View  08/11/2012   *RADIOLOGY REPORT*  Clinical Data: Shortness of breath.  PORTABLE CHEST - 1 VIEW  Comparison: Chest x-ray  08/06/2012.  Findings: Lung volumes are low.  Elevation of the right hemidiaphragm (unchanged). There is cephalization of the pulmonary vasculature, indistinctness of the interstitial markings, and patchy airspace disease throughout the lungs bilaterally suggestive of moderate pulmonary edema.  No definite pleural effusions.  Mild cardiomegaly is unchanged.  Upper mediastinal contours are within normal limits.  Atherosclerosis in the thoracic aorta.  IMPRESSION: 1.  The appearance of the chest, as above is suggestive of congestive heart failure. 2.  Atherosclerosis.   Original Report Authenticated By: Trudie Reed, M.D.   Dg Chest Port 1 View  08/06/2012   *RADIOLOGY REPORT*  Clinical Data: Chest pain and shortness of breath tonight.  PORTABLE CHEST - 1 VIEW  Comparison: 05/24/2012  Findings: Shallow inspiration.  Cardiac enlargement with pulmonary vascular congestion and perihilar edema.  Findings demonstrate some progression since previous study.  No blunting of costophrenic angles.  No pneumothorax.  Mediastinal contours appear intact. Calcified and tortuous aorta.  Degenerative changes in the spine and shoulders.  IMPRESSION: Cardiac enlargement with pulmonary vascular congestion and perihilar edema, demonstrating progression since previous study.   Original  Report Authenticated By: Burman Nieves, M.D.   ASSESSMENT/PLAN:  79 year old female with moderate aortic stenosis, morbid obesity, home oxygen dependent COPD, normal ejection fraction here with worsening shortness of breath-likely multifactorial.  - Even though her echocardiogram describe normal diastolic filling parameters, it is likely that she does have a degree of diastolic dysfunction just based upon her clinical appearance/obesity/aortic stenosis which is moderate. I agree with IV furosemide 80 mg twice a day.  - Continue with aggressive lung treatment.  - On anticoagulation with Coumadin for atrial fibrillation which is currently well  rate controlled.  - Aortic stenosis is moderate in severity based upon peak velocity of 3.2 m/s. She does not warrant surgical repair at this point. If in the future, her valve becomes more severe, one could consider TAVR perhaps but even this would prove to be increased risk given her multiple comorbidities.      Quintella Reichert, MD  08/13/2012  4:54 PM

## 2012-08-13 NOTE — Progress Notes (Signed)
  Date: 08/13/2012  Patient name: [name]  Medical record number: [id number]  Date of birth: 01-Jan-1933   This patient has been seen and the plan of care was discussed with the house staff. Please see their note for complete details. I concur with their findings with the following additions/corrections:  Feels tired, but breathing is slightly better. She still does not feel her breathing is at baseline. Bradycardia noted, asymptomatic and hemodynamics stable. I would continue IV diuresis for today given still hypervolemic, watch BP due to AS. Repeat echo. Watch electrolytes.  Jonah Blue, DO 08/13/2012, 2:08 PM

## 2012-08-13 NOTE — Progress Notes (Signed)
Pt complaining of foul smelling urine. Pt states she was previously treated for a UTI in the hospital. MD notified of urine and no new orders right now. Will continue to monitor pt for s/s of infection. Baron Hamper, RN 08/13/2012

## 2012-08-13 NOTE — Progress Notes (Addendum)
Subjective: Patient states she feels tired today.  Denies sob, dizziness, lightheadedness. She denies dysuria  RN: noted odor to urine and she has been bradycardic  Objective: Vital signs in last 24 hours: Filed Vitals:   08/12/12 1946 08/13/12 0449 08/13/12 0902 08/13/12 1011  BP: 139/66 162/70  128/66  Pulse: 52 51  56  Temp: 98.5 F (36.9 C) 97.4 F (36.3 C)    TempSrc: Oral Oral    Resp: 20 20    Height:      Weight:  273 lb 2.4 oz (123.9 kg)    SpO2: 96% 95% 96%    Weight change: -7.1 oz (-0.2 kg)  Intake/Output Summary (Last 24 hours) at 08/13/12 1143 Last data filed at 08/13/12 0930  Gross per 24 hour  Intake   1280 ml  Output      0 ml  Net   1280 ml   Vitals reviewed. General: resting in bed, arousable, NAD HEENT: [place]/at, no scleral icterus Cardiac: bradycardic, irregular, systolic murmur Pulm: clear to auscultation bilaterally, no wheezes, rales, or rhonchi Abd: soft, nontender, nondistended, BS present, obese ab Ext: warm and well perfused, no pedal edema Neuro: alert and oriented X3, grossly neurologically intact. Moving all 4 extremities   Lab Results: Basic Metabolic Panel:  Recent Labs Lab 08/12/12 0600 08/13/12 0750  NA 137 138  K 4.6 4.1  CL 98 97  CO2 27 34*  GLUCOSE 256* 137*  BUN 41* 49*  CREATININE 1.00 1.14*  CALCIUM 9.8 9.8  MG 2.1 2.1    CBC:  Recent Labs Lab 08/11/12 2240 08/12/12 0600  WBC 12.2* 8.8  NEUTROABS 10.2*  --   HGB 10.1* 9.6*  HCT 33.1* 30.9*  MCV 89.0 88.5  PLT 251 201   Cardiac Enzymes:  Recent Labs Lab 08/06/12 1818 08/12/12 0600 08/12/12 1040  TROPONINI <0.30 <0.30 <0.30   BNP:  Recent Labs Lab 08/11/12 2241  PROBNP 2413.0*   CBG:  Recent Labs Lab 08/08/12 1700 08/12/12 0558 08/12/12 1122 08/12/12 1617 08/12/12 2113 08/13/12 0559  GLUCAP 135* 270* 277* 152* 195* 145*   Coagulation:  Recent Labs Lab 08/07/12 0405 08/08/12 0436 08/11/12 2240 08/13/12 0620  LABPROT 23.1* 23.2*  23.2* 23.2*  INR 2.15* 2.16* 2.16* 2.16*   Misc. Labs: none  Studies/Results: Dg Chest Portable 1 View  08/11/2012   *RADIOLOGY REPORT*  Clinical Data: Shortness of breath.  PORTABLE CHEST - 1 VIEW  Comparison: Chest x-ray 08/06/2012.  Findings: Lung volumes are low.  Elevation of the right hemidiaphragm (unchanged). There is cephalization of the pulmonary vasculature, indistinctness of the interstitial markings, and patchy airspace disease throughout the lungs bilaterally suggestive of moderate pulmonary edema.  No definite pleural effusions.  Mild cardiomegaly is unchanged.  Upper mediastinal contours are within normal limits.  Atherosclerosis in the thoracic aorta.  IMPRESSION: 1.  The appearance of the chest, as above is suggestive of congestive heart failure. 2.  Atherosclerosis.   Original Report Authenticated By: Trudie Reed, M.D.   Medications:  Scheduled Meds: . amLODipine  5 mg Oral Daily  . aspirin EC  81 mg Oral Daily  . atorvastatin  40 mg Oral Daily  . FLUoxetine  20 mg Oral BID  . furosemide  80 mg Intravenous BID  . gabapentin  100 mg Oral BID  . insulin aspart  0-15 Units Subcutaneous TID WC  . insulin aspart  3 Units Subcutaneous TID WC  . insulin detemir  10 Units Subcutaneous Daily  . levothyroxine  150 mcg Oral QAC breakfast  . mometasone-formoterol  2 puff Inhalation BID  . pantoprazole  40 mg Oral Daily  . sodium chloride  3 mL Intravenous Q12H  . sodium chloride  3 mL Intravenous Q12H  . tiotropium  18 mcg Inhalation Daily  . warfarin  5 mg Oral q1800  . Warfarin - Pharmacist Dosing Inpatient   Does not apply q1800   Continuous Infusions:  PRN Meds:.sodium chloride, acetaminophen, albuterol, sodium chloride, sorbitol Assessment/Plan: [name] is a 79 yo F with multiple medical problems COPD, chronic dCHF, afib on coumadin, postablative hypothyroidism, CKD, DM 2, HTN, CVA, DVT, admitted to [facility] on 08/12/12 due to worsening acute on chronic SOB  likely secondary to heart failure with preserved EF.   1. Heart failure with preserved EF -previous history of dCHF.  SOB Improved on home O2 requirements. Her proBNP was 2413 with CXR indicative of CHF -04/10/12 echo with EF 55% normal diastolic dysfunction.  -strict i/o, daily weights -Lasix 80mg  IV BID x today. Be careful with diuresis due to aortic stenosis. Will likely resume oral Lasix 6/20.  home Lasix 80 qam and 40 qpm 6/20 pending volume status in the am [facility] saw the patient and agreed with diuresis and is following. Primary cardiologist Dr. Mayford Knife.  -pending repeat echo  -EKG done today  -Monitor Creatinine via BMET  2. Asymptomatic bradycardia  -History of SSS and bradycardia -Monitor VS  -informed RN to place transcutaneous pads at bedside in case she becomes unstable -EKG today   3. Chronic Atrial Fibrillation on chronic anticoagulation -INR 2.16 today.   -Coumadin per pharmacy. Monitor INR  4. Aortic stenosis (moderate) -No surgical intervention at this time indicated.   -pending repeat echo  5. Chronic kidney disease -Held Losartan 50mg  daily, ARB likely not ideal in patients with AS.  Lasix as above  -Monitor Creatinine via BMET. Creatinine today 1.14 from 1.00 yesterday  6. COPD -Stable. On 3-4L home O2.  -Continue albuterol prn  -Dulera  7. Normocytic Anemia -Baseline Hbg seems to be around 11-12. Hb 10.1-->9.6 at admission, stable from hospital discharge one week prior.  -cbc in the am  8. HTN -intermittently HTN. BP 162/70 -Continue amlodipine 5, Lasix 80 mg iv bid x today, held home Losartan 50 mg not good with mod to severe AS.  Also avoid ACEI's.   9. Hypothyroidism -TSH 4/144 on 06/26/12  -Continue home Levothyroxine 150 mcg daily   10. Diabetes mellitus with neuropathy -A1c 6.4 on 06/26/12 -Held home glipizide -Monitor cbg. Goal <150  -Moderate SSI, 3 units with meals, 10 units of Levemir  -Continue Gabapentin 100 mg bid   11.  Depression -Continue Prozac   12. VTE ppx -Anticoagulation (coumadin)   13. F/E/N -will check BMET, Magnesium -Carb mod diet    Dispo: Disposition is deferred at this time, awaiting improvement of current medical problems.  Anticipated discharge in approximately 2-3 day(s).   The patient does have a current PCP (BHARDWAJ, SHILPA, MD), therefore will be requiring OPC follow-up after discharge.   Patient does not have transportation limitations that hinder transportation to clinic appointments.  .Services Needed at time of discharge: Y = Yes, Blank = No PT: Home health PT if recovers otherwise SNF  OT:   RN:   Equipment: none  Other:     LOS: 2 days   Annett Gula [phone] 08/13/2012, 11:43 AM

## 2012-08-13 NOTE — Progress Notes (Signed)
[facility]  Patient Status: Active (receiving services up to time of hospitalization)  [facility] is providing the following services: RN and PT  If patient discharges after hours, please call [phone].   [name] 08/13/2012, 2:13 PM

## 2012-08-13 NOTE — Progress Notes (Signed)
Pt HR SR in the 30's, this am.  Asymptomatic, 146/71, P56.  Dr. Shirlee Latch informed & going in to see pt.  Instructed to hold am lasix. Also notified team this am pt has foul smelling urine.  Will continue to monitor.  Clayden Withem,RN.

## 2012-08-13 NOTE — Care Management Note (Unsigned)
    Page 1 of 1   08/13/2012     2:53:37 PM   CARE MANAGEMENT NOTE 08/13/2012  Patient:  [name], [name]   Account Number:  [id number]  Date Initiated:  08/13/2012  Documentation initiated by:  Tera Mater  Subjective/Objective Assessment:   79yo female admitted with SOB.  Pt. lives with a caretaker at home, and has support from her son.     Action/Plan:   discharge planning.   Anticipated DC Date:  08/14/2012   Anticipated DC Plan:  HOME W HOME HEALTH SERVICES      DC Planning Services  CM consult      [facility] Choice  Resumption Of Svcs/PTA Provider   Choice offered to / List presented to:             [facility] agency  [facility]   Status of service:  In process, will continue to follow Medicare Important Message given?   (If response is "NO", the following Medicare IM given date fields will be blank) Date Medicare IM given:   Date Additional Medicare IM given:    Discharge Disposition:    Per UR Regulation:  Reviewed for med. necessity/level of care/duration of stay  If discussed at Long Length of Stay Meetings, dates discussed:    Comments:  08/13/12 1451 Noted CM referral for home health needs.  PTA, pt. was being seen by [facility] for [facility] RN, and PT. TC to [place], with [facility], to make aware of pt. admission.  NCM will continue to monitor for dc needs. Tera Mater, RN, BSN NCM [phone]

## 2012-08-13 NOTE — Progress Notes (Signed)
Pt's HR 39 and asymptomatic.  HR up 56.  BP 146/73.  Event nonsustained.  Pt sleeping at this time.  A&0x4. Arousable.   Will continue to monitor.  Amanda Pea, Charity fundraiser.

## 2012-08-13 NOTE — Progress Notes (Signed)
ANTICOAGULATION CONSULT NOTE   Pharmacy Consult for Coumadin Indication: h/o PE/DVT   Labs:  Recent Labs  08/11/12 2240 08/12/12 0600 08/12/12 1040 08/13/12 0620 08/13/12 0750  HGB 10.1* 9.6*  --   --   --   HCT 33.1* 30.9*  --   --   --   PLT 251 201  --   --   --   LABPROT 23.2*  --   --  23.2*  --   INR 2.16*  --   --  2.16*  --   CREATININE 1.12* 1.00  --   --  1.14*  TROPONINI  --  <0.30 <0.30  --   --     Estimated Creatinine Clearance: 51.1 ml/min (by C-G formula based on Cr of 1.14).   Assessment: 79 yo female admitted with SOB, h/o VTE, to continue Coumadin. INR is unchanged this morning at 2.1. No bleeding issues noted.  Goal of Therapy:  INR 2-3 Monitor platelets by anticoagulation protocol: Yes   Plan:  Continue home Coumadin regimen of 5mg  daily Daily INR  Sheppard Coil PharmD., BCPS Clinical Pharmacist Pager [phone] 08/13/2012 11:21 AM

## 2012-08-14 LAB — CBC WITH DIFFERENTIAL/PLATELET
Basophils Absolute: 0.1 10*3/uL (ref 0.0–0.1)
Basophils Relative: 1 % (ref 0–1)
HCT: 33.7 % — ABNORMAL LOW (ref 36.0–46.0)
Lymphocytes Relative: 22 % (ref 12–46)
Monocytes Absolute: 0.5 10*3/uL (ref 0.1–1.0)
Neutro Abs: 4.6 10*3/uL (ref 1.7–7.7)
Neutrophils Relative %: 68 % (ref 43–77)
Platelets: 246 10*3/uL (ref 150–400)
RDW: 16.5 % — ABNORMAL HIGH (ref 11.5–15.5)
WBC: 6.8 10*3/uL (ref 4.0–10.5)

## 2012-08-14 LAB — PROTIME-INR
INR: 2.23 — ABNORMAL HIGH (ref 0.00–1.49)
Prothrombin Time: 23.7 seconds — ABNORMAL HIGH (ref 11.6–15.2)

## 2012-08-14 LAB — MAGNESIUM: Magnesium: 2.1 mg/dL (ref 1.5–2.5)

## 2012-08-14 LAB — BASIC METABOLIC PANEL
CO2: 36 mEq/L — ABNORMAL HIGH (ref 19–32)
Calcium: 10.1 mg/dL (ref 8.4–10.5)
GFR calc Af Amer: 56 mL/min — ABNORMAL LOW (ref >90)
Sodium: 140 mEq/L (ref 135–145)

## 2012-08-14 LAB — GLUCOSE, CAPILLARY
Glucose-Capillary: 126 mg/dL — ABNORMAL HIGH (ref 70–99)
Glucose-Capillary: 131 mg/dL — ABNORMAL HIGH (ref 70–99)

## 2012-08-14 MED ORDER — FUROSEMIDE 80 MG PO TABS
80.0000 mg | ORAL_TABLET | Freq: Every day | ORAL | Status: DC
  Administered 2012-08-15: 80 mg via ORAL
  Filled 2012-08-14 (×2): qty 1

## 2012-08-14 MED ORDER — POLYETHYLENE GLYCOL 3350 17 G PO PACK
17.0000 g | PACK | Freq: Every day | ORAL | Status: DC
  Filled 2012-08-14: qty 1

## 2012-08-14 MED ORDER — MENTHOL 3 MG MT LOZG
1.0000 | LOZENGE | OROMUCOSAL | Status: DC | PRN
  Administered 2012-08-14: 3 mg via ORAL
  Filled 2012-08-14: qty 9

## 2012-08-14 MED ORDER — FUROSEMIDE 40 MG PO TABS
40.0000 mg | ORAL_TABLET | Freq: Two times a day (BID) | ORAL | Status: DC
  Filled 2012-08-14 (×2): qty 2

## 2012-08-14 MED ORDER — FUROSEMIDE 40 MG PO TABS
40.0000 mg | ORAL_TABLET | Freq: Every day | ORAL | Status: DC
  Administered 2012-08-14: 40 mg via ORAL
  Filled 2012-08-14 (×2): qty 1

## 2012-08-14 NOTE — Progress Notes (Signed)
Physical Therapy Treatment Patient Details Name: [name] MRN: [id number] DOB: 1932/08/04 Today's Date: 08/14/2012 Time: 7829-5621 PT Time Calculation (min): 18 min  PT Assessment / Plan / Recommendation Comments on Treatment Session   Pt agreeable to participate in therapy.  She cont's to present with decreased activity tolerance & is significantly dyspneic on exertion however 02 sats remained >90% 3L.      Follow Up Recommendations  Home health PT;Other (comment) (SNF if unable to get back to baseline.  )     Does the patient have the potential to tolerate intense rehabilitation     Barriers to Discharge        Equipment Recommendations  None recommended by PT    Recommendations for Other Services    Frequency Min 3X/week   Plan      Precautions / Restrictions Precautions Precautions: Fall Restrictions Weight Bearing Restrictions: No       Mobility  Bed Mobility Bed Mobility: Not assessed Transfers Transfers: Sit to Stand;Stand to Sit Sit to Stand: 4: Min guard;With upper extremity assist;With armrests;From chair/3-in-1 Stand to Sit: 4: Min guard;With upper extremity assist;With armrests;To chair/3-in-1 Details for Transfer Assistance: Cues for hand placement with stand>sit.  Performed 5x's for strengthening, activity tolerance, & independence.  3/4 dyspnea.   Ambulation/Gait Ambulation/Gait Assistance: 4: Min guard Ambulation Distance (Feet): 80 Feet Assistive device: Rolling walker Ambulation/Gait Assistance Details: Cues for posture, body positioning inside RW, pursed lip breathing.  Ambulating appeared effortful for pt.  3/4 dyspnea.   Gait Pattern: Step-through pattern;Decreased stride length;Trunk flexed Stairs: No Wheelchair Mobility Wheelchair Mobility: No      PT Goals Acute Rehab PT Goals Time For Goal Achievement: 08/26/12 Potential to Achieve Goals: Fair Pt will go Supine/Side to Sit: Independently;with HOB 0 degrees Pt will go Sit to  Stand: with modified independence PT Goal: Sit to Stand - Progress: Progressing toward goal Pt will Transfer Bed to Chair/Chair to Bed: with modified independence Pt will Ambulate: 51 - 150 feet;with modified independence;with least restrictive assistive device PT Goal: Ambulate - Progress: Progressing toward goal Pt will Go Up / Down Stairs: 1-2 stairs;with supervision;with rail(s)  Visit Information  Last PT Received On: 08/14/12 Assistance Needed: +1    Subjective Data  Subjective: "I just got into my chair"  NT states pt transferred from bed>recliner   Cognition  Cognition Arousal/Alertness: Awake/alert Behavior During Therapy: WFL for tasks assessed/performed Overall Cognitive Status: Within Functional Limits for tasks assessed    Balance  Balance Balance Assessed: No  End of Session PT - End of Session Equipment Utilized During Treatment: Gait belt;Oxygen (3L 02 via [place]) Activity Tolerance: Patient tolerated treatment well;Patient limited by fatigue Patient left: in chair;with call bell/phone within reach Nurse Communication: Mobility status     Verdell Face, [place] [phone] 08/14/2012

## 2012-08-14 NOTE — Progress Notes (Signed)
SUBJECTIVE:  Still SOB but improved  OBJECTIVE:   Vitals:   Filed Vitals:   08/14/12 0130 08/14/12 0529 08/14/12 0900 08/14/12 1018  BP: 128/48 126/71  146/74  Pulse:  52    Temp:  98 F (36.7 C)    TempSrc:  Oral    Resp:  18    Height:      Weight:  123.2 kg (271 lb 9.7 oz)    SpO2:  96% 97% 99%   I&O's:    Intake/Output Summary (Last 24 hours) at 08/14/12 1455 Last data filed at 08/14/12 1307  Gross per 24 hour  Intake   1140 ml  Output      0 ml  Net   1140 ml   TELEMETRY: Reviewed telemetry pt in atrial fib, HR below 60 most of the time     PHYSICAL EXAM General: Well developed, well nourished, in no acute distress Head:    Normal cephalic and atramatic  Lungs:   Few crackles at bases Heart:   Irregularly irregular, normal rate S1 S2  3/6 murmur Abdomen: Bowel sounds are positive, abdomen soft and non-tender without masses Extremities:   No  edema.  DP +1 Neuro: Alert and oriented X 3. Psych:  Normalaffect, responds appropriately   LABS: Basic Metabolic Panel:  Recent Labs  40/98/11 0750 08/14/12 0615  NA 138 140  K 4.1 4.0  CL 97 96  CO2 34* 36*  GLUCOSE 137* 130*  BUN 49* 51*  CREATININE 1.14* 1.07  CALCIUM 9.8 10.1  MG 2.1 2.1   Liver Function Tests: No results found for this basename: AST, ALT, ALKPHOS, BILITOT, PROT, ALBUMIN,  in the last 72 hours No results found for this basename: LIPASE, AMYLASE,  in the last 72 hours CBC:  Recent Labs  08/11/12 2240 08/12/12 0600 08/14/12 0615  WBC 12.2* 8.8 6.8  NEUTROABS 10.2*  --  4.6  HGB 10.1* 9.6* 10.1*  HCT 33.1* 30.9* 33.7*  MCV 89.0 88.5 90.1  PLT 251 201 246   Cardiac Enzymes:  Recent Labs  08/12/12 0600 08/12/12 1040  TROPONINI <0.30 <0.30   Coag Panel:   Lab Results  Component Value Date   INR 2.23* 08/14/2012   INR 2.16* 08/13/2012   INR 2.16* 08/11/2012    RADIOLOGY: Dg Chest Portable 1 View  08/11/2012   *RADIOLOGY REPORT*  Clinical Data: Shortness of breath.   PORTABLE CHEST - 1 VIEW  Comparison: Chest x-ray 08/06/2012.  Findings: Lung volumes are low.  Elevation of the right hemidiaphragm (unchanged). There is cephalization of the pulmonary vasculature, indistinctness of the interstitial markings, and patchy airspace disease throughout the lungs bilaterally suggestive of moderate pulmonary edema.  No definite pleural effusions.  Mild cardiomegaly is unchanged.  Upper mediastinal contours are within normal limits.  Atherosclerosis in the thoracic aorta.  IMPRESSION: 1.  The appearance of the chest, as above is suggestive of congestive heart failure. 2.  Atherosclerosis.   Original Report Authenticated By: Trudie Reed, M.D.   Dg Chest Port 1 View  08/06/2012   *RADIOLOGY REPORT*  Clinical Data: Chest pain and shortness of breath tonight.  PORTABLE CHEST - 1 VIEW  Comparison: 05/24/2012  Findings: Shallow inspiration.  Cardiac enlargement with pulmonary vascular congestion and perihilar edema.  Findings demonstrate some progression since previous study.  No blunting of costophrenic angles.  No pneumothorax.  Mediastinal contours appear intact. Calcified and tortuous aorta.  Degenerative changes in the spine and shoulders.  IMPRESSION: Cardiac enlargement with pulmonary  vascular congestion and perihilar edema, demonstrating progression since previous study.   Original Report Authenticated By: Burman Nieves, M.D.   ASSESSMENT/PLAN:  79 year old female with moderate aortic stenosis, morbid obesity, home oxygen dependent COPD, normal ejection fraction here with worsening shortness of breath-likely multifactorial.  - Even though her echocardiogram describe normal diastolic filling parameters, it is likely that she does have a degree of diastolic dysfunction just based upon her clinical appearance/obesity/aortic stenosis which is moderate. I agree with IV furosemide 80 mg twice a day.  - Continue with aggressive lung treatment.  - On anticoagulation with Coumadin  for atrial fibrillation which is currently well rate controlled.  - Aortic stenosis is moderate in severity based upon peak velocity of 3.2 m/s. She does not warrant surgical repair at this point. If in the future, her valve becomes more severe, one could consider TAVR perhaps but even this would prove to be increased risk given her multiple comorbidities.  No sx from her bradycardia at this time.  COntinue to watch.  D/w housestaff.  OK to switch to oral diuretics.      Corky Crafts., MD  08/14/2012  2:55 PM

## 2012-08-14 NOTE — Plan of Care (Signed)
Problem: Phase I Progression Outcomes Goal: Up in chair, BRP Outcome: Progressing Pt OOB to BSC with 1 asst

## 2012-08-14 NOTE — Progress Notes (Addendum)
Subjective: Patient doing well today.  Improved sob.  She has been OOB in chair since 10 am.  She ambulated in the hall today with PT.  Baseline she is on 4L O2 and walks with a walker.  She has not had a bowel movement in 3 days.    Objective: Vital signs in last 24 hours: Filed Vitals:   08/14/12 0130 08/14/12 0529 08/14/12 0900 08/14/12 1018  BP: 128/48 126/71  146/74  Pulse:  52    Temp:  98 F (36.7 C)    TempSrc:  Oral    Resp:  18    Height:      Weight:  271 lb 9.7 oz (123.2 kg)    SpO2:  96% 97% 99%   Weight change: -1 lb 8.7 oz (-0.7 kg)  Intake/Output Summary (Last 24 hours) at 08/14/12 1404 Last data filed at 08/14/12 1307  Gross per 24 hour  Intake   1140 ml  Output      0 ml  Net   1140 ml   Vitals reviewed. General: resting in recliner, NAD, alert and oriented x 3 HEENT: [place]/at, no scleral icterus Cardiac: bradycardic, irregular, systolic murmur Pulm: clear to auscultation bilaterally, no wheezes, rales, or rhonchi Abd: soft, nontender, nondistended, BS present, obese ab Ext: warm and well perfused, no pedal edema Neuro: alert and oriented X3, grossly neurologically intact. Moving all 4 extremities   Lab Results: Basic Metabolic Panel:  Recent Labs Lab 08/13/12 0750 08/14/12 0615  NA 138 140  K 4.1 4.0  CL 97 96  CO2 34* 36*  GLUCOSE 137* 130*  BUN 49* 51*  CREATININE 1.14* 1.07  CALCIUM 9.8 10.1  MG 2.1 2.1    CBC:  Recent Labs Lab 08/11/12 2240 08/12/12 0600 08/14/12 0615  WBC 12.2* 8.8 6.8  NEUTROABS 10.2*  --  4.6  HGB 10.1* 9.6* 10.1*  HCT 33.1* 30.9* 33.7*  MCV 89.0 88.5 90.1  PLT 251 201 246   Cardiac Enzymes:  Recent Labs Lab 08/12/12 0600 08/12/12 1040  TROPONINI <0.30 <0.30   BNP:  Recent Labs Lab 08/11/12 2241  PROBNP 2413.0*   CBG:  Recent Labs Lab 08/13/12 0559 08/13/12 1138 08/13/12 1652 08/13/12 2103 08/14/12 0614 08/14/12 1156  GLUCAP 145* 179* 167* 117* 126* 131*   Coagulation:  Recent  Labs Lab 08/08/12 0436 08/11/12 2240 08/13/12 0620 08/14/12 0615  LABPROT 23.2* 23.2* 23.2* 23.7*  INR 2.16* 2.16* 2.16* 2.23*   Misc. Labs: none  Studies/Results: No results found. Medications:  Scheduled Meds: . amLODipine  5 mg Oral Daily  . aspirin EC  81 mg Oral Daily  . atorvastatin  40 mg Oral Daily  . FLUoxetine  20 mg Oral BID  . furosemide  40-80 mg Oral BID  . gabapentin  100 mg Oral BID  . insulin aspart  0-15 Units Subcutaneous TID WC  . insulin aspart  3 Units Subcutaneous TID WC  . insulin detemir  10 Units Subcutaneous Daily  . levothyroxine  150 mcg Oral QAC breakfast  . mometasone-formoterol  2 puff Inhalation BID  . pantoprazole  40 mg Oral Daily  . polyethylene glycol  17 g Oral Daily  . sodium chloride  3 mL Intravenous Q12H  . tiotropium  18 mcg Inhalation Daily  . warfarin  5 mg Oral q1800  . Warfarin - Pharmacist Dosing Inpatient   Does not apply q1800   Continuous Infusions:  PRN Meds:.acetaminophen, albuterol, sorbitol Assessment/Plan: [name] is a  79 yo F with multiple medical problems COPD, chronic dCHF, afib on coumadin, postablative hypothyroidism, CKD, DM 2, HTN, CVA, DVT, admitted to [facility] on 08/12/12 due to worsening acute on chronic SOB likely secondary to heart failure with preserved EF.   1. Heart failure with preserved EF -previous history of dCHF.  SOB Improved on home O2 requirements 3-4L. Her proBNP was 2413 with CXR indicative of CHF -04/10/12 echo with EF 55% normal diastolic dysfunction.  -strict i/o, daily weights -resume home dose of Lasix 80 mg am and 40 pm. Cards agreeable to this today as well.  Be careful with diuresis due to aortic stenosis.  [facility] saw the patient and agreed with diuresis and is following. Primary cardiologist Dr. Mayford Knife.  -pending repeat echo  -Monitor Creatinine via BMET  2. Asymptomatic bradycardia  -HR 50s-60s.  History of SSS and bradycardia -Monitor VS   3. Chronic Atrial  Fibrillation on chronic anticoagulation -INR  Today 2.23 -Coumadin per pharmacy. Monitor INR  4. Aortic stenosis (moderate) -No surgical intervention at this time indicated.   -pending repeat echo  5. Chronic kidney disease -Held Losartan 50mg  daily, ARB likely not ideal in patients with AS.  Lasix as above  -Monitor Creatinine via BMET. Creatinine today   6. COPD -Stable. -Continue albuterol prn, Dulera, Spiriva   7. Normocytic Anemia -Baseline Hbg seems to be around 11-12. Hb 10.1-->9.6-->10.1   8. HTN -intermittently HTN. BP 126/71 -Continue amlodipine 5, held home Losartan 50 mg not good with mod to severe AS.  Also avoid ACEI's.   9. Hypothyroidism -TSH 4.144 on 06/26/12  -Continue home Levothyroxine 150 mcg daily   10. Diabetes mellitus with neuropathy -A1c 6.4 on 06/26/12 -Held home glipizide -Monitor cbg. Goal <150  -Moderate SSI, 3 units with meals, 10 units of Levemir  -Continue Gabapentin 100 mg bid   11. Depression -Continue Prozac   12. VTE ppx -Anticoagulation (coumadin)   13. F/E/N -will check BMET, Magnesium -Carb mod diet  14. Constipation -Will give Prune juice and Sorbital     Dispo: likely d/c tomorrow after echo done. IMC f/u 08/20/12 at 1:15 PM. Follow up with Dr. [place] [facility] cardiology 08/17/12 1:30 PM  The patient does have a current PCP ([name], SHILPA, MD), therefore will be requiring OPC follow-up after discharge.   Patient does not have transportation limitations that hinder transportation to clinic appointments.  .Services Needed at time of discharge: Y = Yes, Blank = No PT: Home health PT if recovers otherwise SNF  OT:   RN:   Equipment: none  Other:     LOS: 3 days   [name] [phone] 08/14/2012, 2:04 PM

## 2012-08-14 NOTE — Plan of Care (Signed)
Problem: Phase I Progression Outcomes Goal: Up in chair, BRP Outcome: Completed/Met Date Met:  08/14/12 Pt oob to chair, tolerates well  Problem: Phase II Progression Outcomes Goal: Pain controlled Outcome: Completed/Met Date Met:  08/14/12 Pt has not had any c/o pain Goal: Tolerating diet Outcome: Completed/Met Date Met:  08/14/12 Pt tolerating diet well, no c/o n/v

## 2012-08-14 NOTE — Progress Notes (Signed)
Pt HR continue to drop down to 30s non-sustained this shift. Pt asymptomatic. MD already aware of pt bradycardia. Will continue to monitor pt. Baron Hamper, RN 08/14/2012

## 2012-08-14 NOTE — Progress Notes (Signed)
ANTICOAGULATION CONSULT NOTE   Pharmacy Consult for Coumadin Indication: h/o PE/DVT   Labs:  Recent Labs  08/11/12 2240 08/12/12 0600 08/12/12 1040 08/13/12 0620 08/13/12 0750 08/14/12 0615  HGB 10.1* 9.6*  --   --   --  10.1*  HCT 33.1* 30.9*  --   --   --  33.7*  PLT 251 201  --   --   --  246  LABPROT 23.2*  --   --  23.2*  --  23.7*  INR 2.16*  --   --  2.16*  --  2.23*  CREATININE 1.12* 1.00  --   --  1.14* 1.07  TROPONINI  --  <0.30 <0.30  --   --   --     Estimated Creatinine Clearance: 54.2 ml/min (by C-G formula based on Cr of 1.07).   Assessment: 79 yo female admitted with SOB, h/o VTE, to continue Coumadin. INR continues to be stable this morning at 2.2 on home dose of warfarin. No bleeding issues noted.  Goal of Therapy:  INR 2-3 Monitor platelets by anticoagulation protocol: Yes   Plan:  Continue home Coumadin regimen of 5mg  daily INR MWF  Sheppard Coil PharmD., BCPS Clinical Pharmacist Pager [phone] 08/14/2012 8:45 AM

## 2012-08-14 NOTE — Progress Notes (Signed)
  Date: 08/14/2012  Patient name: [name]  Medical record number: [id number]  Date of birth: 19-Oct-1932   This patient has been seen and the plan of care was discussed with the house staff. Please see their note for complete details. I concur with their findings with the following additions/corrections:  Feels better this morning. I&O's not being appropriately recorded. Her breathing is improved. Switch to PO Lasix.  Moderate AS, will need continued monitoring and future consideration for TARV.  Jonah Blue, DO 08/14/2012, 2:58 PM

## 2012-08-14 NOTE — Progress Notes (Signed)
  Echocardiogram 2D Echocardiogram has been performed.  [name] 08/14/2012, 6:23 PM

## 2012-08-15 LAB — BASIC METABOLIC PANEL
BUN: 44 mg/dL — ABNORMAL HIGH (ref 6–23)
CO2: 35 mEq/L — ABNORMAL HIGH (ref 19–32)
Calcium: 9.7 mg/dL (ref 8.4–10.5)
Chloride: 100 mEq/L (ref 96–112)
Creatinine, Ser: 1.06 mg/dL (ref 0.50–1.10)
Glucose, Bld: 133 mg/dL — ABNORMAL HIGH (ref 70–99)

## 2012-08-15 LAB — GLUCOSE, CAPILLARY
Glucose-Capillary: 144 mg/dL — ABNORMAL HIGH (ref 70–99)
Glucose-Capillary: 166 mg/dL — ABNORMAL HIGH (ref 70–99)

## 2012-08-15 NOTE — Discharge Summary (Signed)
Patient Name:  [name]  MRN: [id number]  PCP: Aletta Edouard, MD  DOB:  05/24/1932       Date of Admission:  08/11/2012  Date of Discharge:  08/15/2012      Attending Physician: Dr. Jonah Blue, DO         DISCHARGE DIAGNOSES: 1. Principal Problem: 2.   Acute on chronic diastolic CHF (congestive heart failure) 3. Active Problems: 4.   HYPOTHYROIDISM 5.   DIABETES MELLITUS 6.   HYPERLIPIDEMIA 7.   OBESITY, MORBID 8.   DEPRESSION 9.   HYPERTENSION 10.   Atrial fibrillation 11.   COPD 12.   CKD (chronic kidney disease) stage 3, GFR 30-59 ml/min 13.   CAD (coronary artery disease) 14.   Aortic stenosis   DISPOSITION AND FOLLOW-UP: [name] is to follow-up with the listed providers as detailed below, at patient's visiting, please address following issues:  1. Check BMP for electrolytes and renal function 2. Please check her INR at her follow up visit.   Follow-up Information   Follow up with [facility]. (home health nurse and physical therapy)    Contact information:   [phone]      Follow up with [facility]. (appt 08/20/12 at 1:15 PM)    Contact information:   [address] [place] [address] [phone]      Follow up with [facility]. (08/17/12 1:30 PM)    Contact information:   [address] [place] [address] [place] [address] [phone]      Future Appointments Provider Department Dept Phone   10/13/2012 8:30 AM Sherrie George, MD [facility] [phone]       DISCHARGE MEDICATIONS:   Medication List    STOP taking these medications       losartan 50 MG tablet  Commonly known as:  COZAAR      TAKE these medications       acetaminophen 500 MG tablet  Commonly known as:  TYLENOL  Take 1,000 mg by mouth 2 (two) times daily as needed. For pain.     albuterol 108 (90 BASE) MCG/ACT inhaler  Commonly known as:  PROVENTIL HFA  Inhale 2 puffs into the  lungs every 6 (six) hours as needed. For wheezing.     amLODipine 5 MG tablet  Commonly known as:  NORVASC  Take 5 mg by mouth daily.     aspirin 81 MG EC tablet  Take 1 tablet (81 mg total) by mouth daily.     atorvastatin 40 MG tablet  Commonly known as:  LIPITOR  Take 40 mg by mouth daily.     colchicine 0.6 MG tablet  Take 0.6 mg by mouth See admin instructions. Take for 3 days when gout flares up     FLUoxetine 20 MG capsule  Commonly known as:  PROZAC  Take 20 mg by mouth 2 (two) times daily.     Fluticasone-Salmeterol 250-50 MCG/DOSE Aepb  Commonly known as:  ADVAIR  Inhale 1 puff into the lungs every 12 (twelve) hours.     furosemide 40 MG tablet  Commonly known as:  LASIX  Take 40-80 mg by mouth 2 (two) times daily. 80 mg in the morning and 40 mg at night     gabapentin 100 MG capsule  Commonly known as:  NEURONTIN  Take 100 mg by mouth 2 (two) times daily.     glipiZIDE 10 MG  tablet  Commonly known as:  GLUCOTROL  Take 5 mg by mouth 2 (two) times daily before a meal.     levothyroxine 150 MCG tablet  Commonly known as:  SYNTHROID  Take 1 tablet (150 mcg total) by mouth daily.     loperamide 2 MG capsule  Commonly known as:  IMODIUM  Take 1 capsule (2 mg total) by mouth as needed for diarrhea or loose stools (up to total 16mg  daily).     omeprazole 20 MG capsule  Commonly known as:  PRILOSEC  Take 40 mg by mouth 2 (two) times daily.     tiotropium 18 MCG inhalation capsule  Commonly known as:  SPIRIVA HANDIHALER  Place 1 capsule (18 mcg total) into inhaler and inhale daily.     warfarin 5 MG tablet  Commonly known as:  COUMADIN  Take 5 mg by mouth daily.       CONSULTS: Cardiology    PROCEDURES PERFORMED:  Dg Chest Portable 1 View  08/11/2012, IMPRESSION: 1.  The appearance of the chest, as above is suggestive of congestive heart failure. 2.  Atherosclerosis.   Original Report Authenticated By: Trudie Reed, M.D.   08/06/2012, IMPRESSION:  Cardiac enlargement with pulmonary vascular congestion and perihilar edema, demonstrating progression since previous study.   Original Report Authenticated By: Burman Nieves, M.D.     ADMISSION DATA:  History of Present Illness: [name] is a 79 year old woman with PMH of DM2, hyperlipidemia, HTN, A. Fib on warfarin, history of PE, CHF (EF of 55% per 2D echo on 04/21/12 ), CAD (MI 20 yrs ago, cath in 2006 with nonobstructive disease and 30-40% stenosis on circumflex), moderate aortic stenosis (mild regurgitation, 0.57cm^2 area per last 2D echo), CVA (with no residual deficit), and COPD on 4L home oxygen who presents to the [facility] ED via EMS with increasing shortness of breath.   For the past 2 days, she's been experiencing progressive shortness of breath and general discomfort. She reports that she spent the day prior to admission seated up in the chair, but she is unsure whether her orthopnea has worsened. No increased leg swelling but she thinks that her abdomen is more distended. She denies overt chest pain, palpitations, diaphoresis or syncope/dizziness. She also denies cough and increased sputum production but she admits to some wheezing. She also admits to subjective low-grade fevers with increased fatigue. She describes her appetite as 'so-so', without nausea or vomiting.  She has not increased the use of her of inhalers and she is using the same amount of oxygen at home. She denies dietary indiscretions and she has been compliant with her regular medications including Lasix 80 mg AM and 40 mg PM.   In the ED she had transient desaturation to 77-88%, she was put on NRB and improved to 99%. As per RT, her nasal canula oxygen was off during that episode. At our evaluation she was saturating in the 93-98 on 4L by [place]. She was discharged from [facility] on 08/08/2012 after a negative evaluation for chest pain. Since returning home she only had ''very few good days''.  HOSPITAL COURSE:  [name]  is a 79 yo F with multiple medical problems COPD, chronic dCHF, afib on coumadin, postablative hypothyroidism, CKD, DM 2, HTN, CVA, DVT, admitted to [facility] on 08/12/12 due to worsening acute on chronic SOB likely secondary to heart failure with preserved EF.   1. Heart failure with preserved EF: Patient has hx of dCHF. 04/10/12 echo showed  EF 55%. Repeated 2D echo in this admission had no signigicant change (EF 55% to 60%, wall motion was normal;Valve area: 0.55cm^2 (VTI). Valve area: 0.54cm^2 (Vmax). Her proBNP was 2413 with CXR indicative of CHF on admisson. Due to her aortic stenosis, she was carefully diuresed. Cardiology was consulted and agreed with the diuretic treatment. Cardiology did not think she needed invasive procedure at this time. Her home Losartan was discontinued due to concerning of afterload reduction. She responded to the treatment well. Her SOB improved significantly. Her condition is back to her baseline at discharge. We switched to her home dose of Lasix 80 mg am and 40 pm on 6/20. She will continue these regiment at discharge. Patient dose not want to go to SNF. She is discharged home with HHPT. She will follow up in clinic and also with cardiology.   2. Asymptomatic bradycardia: she has hx of SSS and bradycardia. HR has been 50s-60s with junctional rhythm in hospital. Patient has been asymptomatic. We closely monitored her HR and no event happened.   3. Chronic Atrial Fibrillation on chronic anticoagulation: INR was 2.23 on 6/20. We continue her coumadin at discharge.   4. Aortic stenosis (moderate): Cardiology was consulted. No surgical intervention at this time indicated.    5. Chronic kidney disease: Stable. We discontinued her Losartan, ARB likely not ideal in patients with AS. Cre was 1.06 at discharge.   6. COPD: Stable. Continued albuterol prn, Dulera, Spiriva    7. Normocytic Anemia: Baseline Hbg seems to be around 11-12. Hb 10.1-->9.6-->10.1 at discharge.  8. HTN:  intermittently HTN. Continued amlodipine 5, held home Losartan 50 mg not good with mod to severe AS.  Also avoid ACEI's.   9. Hypothyroidism: TSH 4.144 on 06/26/12. Continued home Levothyroxine 150 mcg daily   10. Diabetes mellitus with neuropathy: -A1c 6.4 on 06/26/12. Continue home glipizide at discharge. Continued Gabapentin 100 mg bid.  11. Depression: stable. Continued Prozac   DISCHARGE DATA: Vital Signs: BP 158/75  Pulse 52  Temp(Src) 97.8 F (36.6 C) (Oral)  Resp 20  Ht 5\' 2"  (1.575 m)  Wt 272 lb 14.9 oz (123.8 kg)  BMI 49.91 kg/m2  SpO2 98%  LMP 05/22/1968  Labs: Results for orders placed during the hospital encounter of 08/11/12 (from the past 24 hour(s))  GLUCOSE, CAPILLARY     Status: Abnormal   Collection Time    08/14/12  4:27 PM      Result Value Range   Glucose-Capillary 190 (*) 70 - 99 mg/dL  GLUCOSE, CAPILLARY     Status: Abnormal   Collection Time    08/14/12  9:40 PM      Result Value Range   Glucose-Capillary 171 (*) 70 - 99 mg/dL   Comment 1 Notify RN    BASIC METABOLIC PANEL     Status: Abnormal   Collection Time    08/15/12  5:54 AM      Result Value Range   Sodium 142  135 - 145 mEq/L   Potassium 3.9  3.5 - 5.1 mEq/L   Chloride 100  96 - 112 mEq/L   CO2 35 (*) 19 - 32 mEq/L   Glucose, Bld 133 (*) 70 - 99 mg/dL   BUN 44 (*) 6 - 23 mg/dL   Creatinine, Ser 2.95  0.50 - 1.10 mg/dL   Calcium 9.7  8.4 - 62.1 mg/dL   GFR calc non Af Amer 49 (*) >90 mL/min   GFR calc Af Amer 57 (*) >90 mL/min  MAGNESIUM     Status: None   Collection Time    08/15/12  5:54 AM      Result Value Range   Magnesium 2.2  1.5 - 2.5 mg/dL  GLUCOSE, CAPILLARY     Status: Abnormal   Collection Time    08/15/12  7:00 AM      Result Value Range   Glucose-Capillary 144 (*) 70 - 99 mg/dL  GLUCOSE, CAPILLARY     Status: Abnormal   Collection Time    08/15/12 11:11 AM      Result Value Range   Glucose-Capillary 166 (*) 70 - 99 mg/dL   Comment 1 Notify RN      .Services  Needed at time of discharge: Y = Yes, Blank = No PT:  Home health PT  OT:     RN:     Equipment:  none   Other:       Time Spent on Discharge: 35 min   Signed: Lorretta Harp, MD PGY 2, Internal Medicine Resident 08/15/2012, 3:16 PM

## 2012-08-15 NOTE — Progress Notes (Signed)
No acute distress noted. Slept well and assisted with ADL few times.

## 2012-08-15 NOTE — Progress Notes (Signed)
SUBJECTIVE:  No complaints today  OBJECTIVE:   Vitals:   Filed Vitals:   08/14/12 1018 08/14/12 1508 08/14/12 2103 08/15/12 0412  BP: 146/74 132/73 141/37 143/70  Pulse:  62 55 52  Temp:  97.3 F (36.3 C) 98 F (36.7 C) 97.8 F (36.6 C)  TempSrc:  Oral Oral Oral  Resp:  18 20 20   Height:      Weight:    123.8 kg (272 lb 14.9 oz)  SpO2: 99% 95% 98% 98%   I&O's:   Intake/Output Summary (Last 24 hours) at 08/15/12 4098 Last data filed at 08/15/12 0800  Gross per 24 hour  Intake   1490 ml  Output    300 ml  Net   1190 ml   TELEMETRY: Reviewed telemetry pt in atrial fibrillation     PHYSICAL EXAM General: Well developed, well nourished, in no acute distress Head: Eyes PERRLA, No xanthomas.   Normal cephalic and atramatic  Lungs:   Clear bilaterally to auscultation and percussion. Heart:   HRRR S1 S2 Pulses are 2+ & equal. Abdomen: Bowel sounds are positive, abdomen soft and non-tender without masses  Extremities:   No clubbing, cyanosis or edema.  DP +1 Neuro: Alert and oriented X 3. Psych:  Good affect, responds appropriately   LABS: Basic Metabolic Panel:  Recent Labs  11/91/47 0615 08/15/12 0554  NA 140 142  K 4.0 3.9  CL 96 100  CO2 36* 35*  GLUCOSE 130* 133*  BUN 51* 44*  CREATININE 1.07 1.06  CALCIUM 10.1 9.7  MG 2.1 2.2   Liver Function Tests: No results found for this basename: AST, ALT, ALKPHOS, BILITOT, PROT, ALBUMIN,  in the last 72 hours No results found for this basename: LIPASE, AMYLASE,  in the last 72 hours CBC:  Recent Labs  08/14/12 0615  WBC 6.8  NEUTROABS 4.6  HGB 10.1*  HCT 33.7*  MCV 90.1  PLT 246   Cardiac Enzymes:  Recent Labs  08/12/12 1040  TROPONINI <0.30   Coag Panel:   Lab Results  Component Value Date   INR 2.23* 08/14/2012   INR 2.16* 08/13/2012   INR 2.16* 08/11/2012    RADIOLOGY: Dg Chest Portable 1 View  08/11/2012   *RADIOLOGY REPORT*  Clinical Data: Shortness of breath.  PORTABLE CHEST - 1 VIEW   Comparison: Chest x-ray 08/06/2012.  Findings: Lung volumes are low.  Elevation of the right hemidiaphragm (unchanged). There is cephalization of the pulmonary vasculature, indistinctness of the interstitial markings, and patchy airspace disease throughout the lungs bilaterally suggestive of moderate pulmonary edema.  No definite pleural effusions.  Mild cardiomegaly is unchanged.  Upper mediastinal contours are within normal limits.  Atherosclerosis in the thoracic aorta.  IMPRESSION: 1.  The appearance of the chest, as above is suggestive of congestive heart failure. 2.  Atherosclerosis.   Original Report Authenticated By: Trudie Reed, M.D.   Dg Chest Port 1 View  08/06/2012   *RADIOLOGY REPORT*  Clinical Data: Chest pain and shortness of breath tonight.  PORTABLE CHEST - 1 VIEW  Comparison: 05/24/2012  Findings: Shallow inspiration.  Cardiac enlargement with pulmonary vascular congestion and perihilar edema.  Findings demonstrate some progression since previous study.  No blunting of costophrenic angles.  No pneumothorax.  Mediastinal contours appear intact. Calcified and tortuous aorta.  Degenerative changes in the spine and shoulders.  IMPRESSION: Cardiac enlargement with pulmonary vascular congestion and perihilar edema, demonstrating progression since previous study.   Original Report Authenticated By: Burman Nieves,  M.D.    ASSESSMENT/PLAN:  79 year old female with moderate aortic stenosis, morbid obesity, home oxygen dependent COPD, normal ejection fraction here with worsening shortness of breath-likely multifactorial.  - Even though her echocardiogram describe normal diastolic filling parameters, it is likely that she does have a degree of diastolic dysfunction just based upon her clinical appearance/obesity/aortic stenosis which is moderate. - Continue with aggressive lung treatment.  - On anticoagulation with Coumadin for atrial fibrillation which is currently well rate controlled.  -  Aortic stenosis is moderate in severity based upon peak velocity of 3.2 m/s. She does not warrant surgical repair at this point. If in the future, her valve becomes more severe, one could consider TAVR perhaps but even this would prove to be increased risk given her multiple comorbidities.  -Bradycardia no sx from her bradycardia at this time.  -CHF stable on oral diuretics No new recs will sign off - call with any questions   Quintella Reichert, MD  08/15/2012  8:52 AM

## 2012-08-15 NOTE — Progress Notes (Signed)
Pt going home with RN/PT, pt given dc instructions, medications and follow up appointments reviewed, pt verbalized understanding, pt left with son via wheelchair, pt stable upon dc

## 2012-08-15 NOTE — Progress Notes (Signed)
Subjective:  Patient doing well today.  Improved sob. She feels that her SOB is back to her baseline. She wants to go home, not to SNF. No chest pain. No fever or chills.  Objective: Vital signs in last 24 hours: Filed Vitals:   08/14/12 2103 08/15/12 0412 08/15/12 0909 08/15/12 1010  BP: 141/37 143/70  158/75  Pulse: 55 52    Temp: 98 F (36.7 C) 97.8 F (36.6 C)    TempSrc: Oral Oral    Resp: 20 20    Height:      Weight:  272 lb 14.9 oz (123.8 kg)    SpO2: 98% 98% 98%    Weight change: 1 lb 5.2 oz (0.6 kg)  Intake/Output Summary (Last 24 hours) at 08/15/12 1515 Last data filed at 08/15/12 1358  Gross per 24 hour  Intake   1610 ml  Output      0 ml  Net   1610 ml   Vitals reviewed. General: resting in recliner, NAD, alert and oriented x 3 HEENT: [place]/at, no scleral icterus Cardiac: bradycardic, irregular, systolic murmur Pulm: clear to auscultation bilaterally, no wheezes, rales, or rhonchi Abd: soft, nontender, nondistended, BS present, obese ab Ext: warm and well perfused, no pedal edema Neuro: alert and oriented X3, grossly neurologically intact. Moving all 4 extremities   Lab Results: Basic Metabolic Panel:  Recent Labs Lab 08/14/12 0615 08/15/12 0554  NA 140 142  K 4.0 3.9  CL 96 100  CO2 36* 35*  GLUCOSE 130* 133*  BUN 51* 44*  CREATININE 1.07 1.06  CALCIUM 10.1 9.7  MG 2.1 2.2    CBC:  Recent Labs Lab 08/11/12 2240 08/12/12 0600 08/14/12 0615  WBC 12.2* 8.8 6.8  NEUTROABS 10.2*  --  4.6  HGB 10.1* 9.6* 10.1*  HCT 33.1* 30.9* 33.7*  MCV 89.0 88.5 90.1  PLT 251 201 246   Cardiac Enzymes:  Recent Labs Lab 08/12/12 0600 08/12/12 1040  TROPONINI <0.30 <0.30   BNP:  Recent Labs Lab 08/11/12 2241  PROBNP 2413.0*   CBG:  Recent Labs Lab 08/14/12 0614 08/14/12 1156 08/14/12 1627 08/14/12 2140 08/15/12 0700 08/15/12 1111  GLUCAP 126* 131* 190* 171* 144* 166*   Coagulation:  Recent Labs Lab 08/11/12 2240 08/13/12 0620  08/14/12 0615  LABPROT 23.2* 23.2* 23.7*  INR 2.16* 2.16* 2.23*   Misc. Labs: none  Studies/Results: No results found. Medications:  Scheduled Meds: . amLODipine  5 mg Oral Daily  . aspirin EC  81 mg Oral Daily  . atorvastatin  40 mg Oral Daily  . FLUoxetine  20 mg Oral BID  . furosemide  40 mg Oral q1800  . furosemide  80 mg Oral Daily  . gabapentin  100 mg Oral BID  . insulin aspart  0-15 Units Subcutaneous TID WC  . insulin aspart  3 Units Subcutaneous TID WC  . insulin detemir  10 Units Subcutaneous Daily  . levothyroxine  150 mcg Oral QAC breakfast  . mometasone-formoterol  2 puff Inhalation BID  . pantoprazole  40 mg Oral Daily  . sodium chloride  3 mL Intravenous Q12H  . tiotropium  18 mcg Inhalation Daily  . warfarin  5 mg Oral q1800  . Warfarin - Pharmacist Dosing Inpatient   Does not apply q1800   Continuous Infusions:  PRN Meds:.acetaminophen, albuterol, menthol-cetylpyridinium, sorbitol Assessment/Plan: Ms. BRYSON PALEN is a 79 yo F with multiple medical problems COPD, chronic dCHF, afib on coumadin, postablative hypothyroidism, CKD, DM 2, HTN,  CVA, DVT, [facility]12 due to worsening acute on chronic SOB likely secondary to heart failure with preserved EF.   1. Heart failure with preserved EF -previous history of dCHF.  SOB Improved on home O2 requirements 3-4L. Her proBNP was 2413 with CXR indicative of CHF -04/10/12 echo with EF 55% normal diastolic dysfunction. Repeated 2D echo has no signigicant change (EF 55% to 60%, Wall motion was normal;Valve area: 0.55cm^2(VTI). Valve area: 0.54cm^2 (Vmax).  -Will d/c home and follow up with clinic and cardiology -continue home dose of Lasix 80 mg am and 40 pm on 6/20.  Be careful with diuresis due to aortic stenosis.   2. Asymptomatic bradycardia  -HR 50s-60s.  History of SSS and bradycardia  3. Chronic Atrial Fibrillation on chronic anticoagulation -INR  Today 2.23 on 6/20 -Continue coumadin.    4. Aortic stenosis (moderate) -No surgical intervention at this time indicated.    5. Chronic kidney disease -Hold Losartan 50mg  daily, ARB likely not ideal in patients with AS.  Lasix as above   6. COPD -Stable. -Continue albuterol prn, Dulera, Spiriva   7. Normocytic Anemia -Baseline Hbg seems to be around 11-12. Hb 10.1-->9.6-->10.1   8. HTN -intermittently HTN.  -Continue amlodipine 5, held home Losartan 50 mg not good with mod to severe AS.  Also avoid ACEI's.   9. Hypothyroidism -TSH 4.144 on 06/26/12  -Continue home Levothyroxine 150 mcg daily   10. Diabetes mellitus with neuropathy -A1c 6.4 on 06/26/12 -continue home glipizide at discharge -Continue Gabapentin 100 mg bid   11. Depression -Continue Prozac   12. VTE ppx -Anticoagulation (coumadin)   Dispo: likely d/c tomorrow after echo done. IMC f/u 08/20/12 at 1:15 PM. Follow up with Dr. Gasquet Desanctis cardiology 08/17/12 1:30 PM  The patient does have a current PCP (BHARDWAJ, SHILPA, MD), therefore will be requiring OPC follow-up after discharge.   Patient does not have transportation limitations that hinder transportation to clinic appointments.  .Services Needed at time of discharge: Y = Yes, Blank = No PT: Home health PT if recovers otherwise SNF  OT:   RN:   Equipment: none  Other:     LOS: 4 days   Lorretta Harp [phone] 08/15/2012, 3:15 PM

## 2012-08-18 NOTE — Discharge Summary (Signed)
  Date: 08/18/2012  Patient name: [name]  Medical record number: [id number]  Date of birth: 1932/06/23   This patient has been discussed with the house staff. Please see their note for complete details. I concur with their findings and plan. Jonah Blue, DO 08/18/2012, 1:56 PM

## 2012-08-20 ENCOUNTER — Encounter: Payer: Self-pay | Admitting: Internal Medicine

## 2012-08-20 ENCOUNTER — Ambulatory Visit (INDEPENDENT_AMBULATORY_CARE_PROVIDER_SITE_OTHER): Payer: Medicare PPO | Admitting: Internal Medicine

## 2012-08-20 ENCOUNTER — Telehealth: Payer: Self-pay | Admitting: *Deleted

## 2012-08-20 ENCOUNTER — Other Ambulatory Visit: Payer: Self-pay | Admitting: Internal Medicine

## 2012-08-20 VITALS — BP 132/53 | HR 54 | Temp 97.0°F | Wt 272.3 lb

## 2012-08-20 DIAGNOSIS — I509 Heart failure, unspecified: Secondary | ICD-10-CM

## 2012-08-20 DIAGNOSIS — R269 Unspecified abnormalities of gait and mobility: Secondary | ICD-10-CM

## 2012-08-20 DIAGNOSIS — E785 Hyperlipidemia, unspecified: Secondary | ICD-10-CM

## 2012-08-20 DIAGNOSIS — Z7901 Long term (current) use of anticoagulants: Secondary | ICD-10-CM

## 2012-08-20 DIAGNOSIS — I5033 Acute on chronic diastolic (congestive) heart failure: Secondary | ICD-10-CM

## 2012-08-20 LAB — LIPID PANEL
Cholesterol: 99 mg/dL (ref 0–200)
Total CHOL/HDL Ratio: 2.2 Ratio
VLDL: 18 mg/dL (ref 0–40)

## 2012-08-20 LAB — POCT INR: INR: 4.7

## 2012-08-20 LAB — GLUCOSE, CAPILLARY: Glucose-Capillary: 114 mg/dL — ABNORMAL HIGH (ref 70–99)

## 2012-08-20 MED ORDER — MONTELUKAST SODIUM 10 MG PO TABS: 10.0000 mg | ORAL_TABLET | Freq: Every day | ORAL | Status: DC

## 2012-08-20 MED ORDER — ALLOPURINOL 100 MG PO TABS: 200.0000 mg | ORAL_TABLET | Freq: Every day | ORAL | Status: DC

## 2012-08-20 NOTE — Telephone Encounter (Signed)
Med list printed and provided to pt and caregiver. Discussed red flag sx w patient. She really needs a scale for daily weights. I will put in a note to shana about this.. Thanks, Eber Jones

## 2012-08-20 NOTE — Progress Notes (Signed)
Patient ID: [name], female   DOB: May 14, 1932, 79 y.o.   MRN: [id number]  Subjective:   Patient ID: [name] female   DOB: 04/23/1932 79 y.o.   MRN: [id number]  HPI: [name] is a 79 yo F with multiple medical problems COPD, chronic dCHF, afib on coumadin, postablative hypothyroidism, CKD, DM 2, HTN, CVA, DVT, here for hospital f/u after recent admission to [facility] on 08/12/12 for treatment of acute on chronic dCHF. She was admitted with dyspnea, increased proBNP, and pulmonary edema. Repeat echo showed unchanged preserved ejection fraction and diastolic dysfunction. She was carefully diuresed given her moderate aortic stenosis. During hospitalization, at the advisement of cardiology, losartan was discontinued due to concern for afterload reduction in the setting of her moderate aortic stenosis. She was transitioned back to her home dose of Lasix 80 mg in morning and 20 mg in the evening at the time of discharge. Today she reports stable breathing, no CP. Weight stable today from d/c weight (273-->272lbs).  Had cardiology appt earlier in the week, Cr stable from discharge at 1.08 and K 4.1.  She reports she is having occasional falls at home and gait instability using her walker. She said she previously had home health PT, but they've stopped coming out. She says she talked to [employer] company they said they would try to renew services, but she's not heard anything else about this.   Past Medical History  Diagnosis Date  . Coronary atherosclerosis of native coronary artery     Mild at cateterization 2006  . Abdominal pain     Likely secondary to presbyesophagus and gastric dysmotility  . Chronic diastolic heart failure   . Respiratory failure, chronic     Mixed etiology with bronchospastic component  . Urinary incontinence     Recurrent uti's/resistance to cipro, bactrim  . Gout   . Postablative hypothyroidism     H/o Graves disease s/p radioactive iodine ablation  with resultant postablative hypothyroidism  . Hyperlipidemia   . Depression   . Morbidly obese     s/p gastric plication surgery  . Gastroesophageal reflux disease   . Ventral hernia     Repair in April 2008, complicated by MRSA abdominal wall cellulitis  . DVT (deep venous thrombosis) 1998  . Pulmonary embolism 1998    [place] filter  . Diabetes mellitus type 2, controlled, with complications   . Essential hypertension, benign   . AF (atrial fibrillation)     on chronic warfarin  . Osteoarthritis   . Stroke 1997    Denies residual  . Anemia     Blood transfusion [V58.2]  . Supratherapeutic INR 05/15/2012    Possibly induced by recent steroid use.   . Chest pain at rest 08/07/2012   Current Outpatient Prescriptions  Medication Sig Dispense Refill  . acetaminophen (TYLENOL) 500 MG tablet Take 1,000 mg by mouth 2 (two) times daily as needed. For pain.      Marland Kitchen albuterol (PROVENTIL HFA) 108 (90 BASE) MCG/ACT inhaler Inhale 2 puffs into the lungs every 6 (six) hours as needed. For wheezing.  3.7 g  11  . amLODipine (NORVASC) 5 MG tablet Take 5 mg by mouth daily.      Marland Kitchen aspirin EC 81 MG EC tablet Take 1 tablet (81 mg total) by mouth daily.      Marland Kitchen atorvastatin (LIPITOR) 40 MG tablet Take 40 mg by mouth daily.      . colchicine 0.6 MG tablet  Take 0.6 mg by mouth See admin instructions. Take for 3 days when gout flares up      . FLUoxetine (PROZAC) 20 MG capsule Take 20 mg by mouth 2 (two) times daily.      . Fluticasone-Salmeterol (ADVAIR) 250-50 MCG/DOSE AEPB Inhale 1 puff into the lungs every 12 (twelve) hours.      . furosemide (LASIX) 40 MG tablet Take 40-80 mg by mouth 2 (two) times daily. 80 mg in the morning and 40 mg at night      . gabapentin (NEURONTIN) 100 MG capsule Take 100 mg by mouth 2 (two) times daily.      Marland Kitchen glipiZIDE (GLUCOTROL) 10 MG tablet Take 5 mg by mouth 2 (two) times daily before a meal.      . levothyroxine (SYNTHROID) 150 MCG tablet Take 1 tablet (150 mcg  total) by mouth daily.  30 tablet  11  . loperamide (IMODIUM) 2 MG capsule Take 1 capsule (2 mg total) by mouth as needed for diarrhea or loose stools (up to total 16mg  daily).  8 capsule  0  . omeprazole (PRILOSEC) 20 MG capsule Take 40 mg by mouth 2 (two) times daily.      Marland Kitchen tiotropium (SPIRIVA HANDIHALER) 18 MCG inhalation capsule Place 1 capsule (18 mcg total) into inhaler and inhale daily.  30 capsule  4  . warfarin (COUMADIN) 5 MG tablet Take 5 mg by mouth daily.       No current facility-administered medications for this visit.   Family History  Problem Relation Age of Onset  . Colon cancer Neg Hx    History   Social History  . Marital Status: Widowed    Spouse Name: N/A    Number of Children: N/A  . Years of Education: N/A   Social History Main Topics  . Smoking status: Former Smoker -- 1.00 packs/day for 1.5 years    Types: Cigarettes  . Smokeless tobacco: Never Used  . Alcohol Use: No  . Drug Use: No  . Sexually Active: No   Other Topics Concern  . Not on file   Social History Narrative   She lives w/her son. Her son offers help and she has an aid who occasionally cooks for her and takes care of some other tasks around the pt's house.   Has medicare. Has son in [place] who is POA   Ambivalent about DNR issues and does not want a STOP sign posted in home   Home Health Aid visits 6 days a week            Review of Systems: 10 pt ROS performed, pertinent positives and negatives noted in HPI Objective:  Physical Exam: Filed Vitals:   08/20/12 1318 08/20/12 1319  BP: 132/53   Pulse: 54   Temp: 97 F (36.1 C)   TempSrc: Oral   Weight: 272 lb 4.8 oz (123.514 kg)   SpO2: 90% 90%   Vitals reviewed. General: obese female in wheelchair, O2 on, NAD HEENT: MMM of OP, anicteric sclerae Cardiac: loud SEM RUSB. Rate 50-60 regular. Pulm: good air movement no wheezes or rales Abd: soft, NTND Ext: warm and well perfused, trace edema   Assessment & Plan:   Please see  problem-based charting for assessment and plan.

## 2012-08-20 NOTE — Telephone Encounter (Signed)
Call from [facility], care manager with [facility] # [phone] Susanne Borders states pt will be seen today.  She wanted [place] to make patient aware of any Red Flags she needs to watch for and when she should call clinic for health problems.  She is seen once a week by [facility] nurse.   Nurse is also doing med rec and request that you send home an undated med list so she can review with pt.

## 2012-08-20 NOTE — Assessment & Plan Note (Signed)
Lab Results  Component Value Date   CHOL 136 08/02/2011   HDL 36* 08/02/2011   LDLCALC 62 08/02/2011   TRIG 192* 08/02/2011   CHOLHDL 3.8 08/02/2011   Due for repeat lipid panel today. Previously well controlled on atorvastatin as per above.

## 2012-08-20 NOTE — Patient Instructions (Addendum)
1. I will send a message to our social worker asking about the status of your scale. The best way to monitor your heart failure is daily weights. If you were to gain more than 2 pounds in a day or 5 pounds in 1 week, we want you to give [place] a call. 2. I will print out an updated med list for your.

## 2012-08-20 NOTE — Assessment & Plan Note (Signed)
Acute exacerbation appears results today at the hospital follow visit. Weight is stable from hospital discharge date, down 1 pound on her home diuretic dosage Lasix 80 in the morning 40 in the evening. No worsening shortness of breath, chest pain or chest pressure. She had an appointment with cardiology earlier this week and we have lab results from that visit. Her creatinine is stable 1.08 her potassium is 4.1. No need to check additional labs today. Her home health nurse is asking about [place] flecks to look for home. Discussed with patient the need for monitoring of daily weights. She says she is in the process of getting a scale that she can climb on without being unstable. Per patient, home health services physical therapy have been discontinued but she still has an unsteady gait and would benefit from assistance.  - Continue home diuretic doses  We'll put in another referral for home health PT to renew for patient as she is high fall risk

## 2012-08-20 NOTE — Assessment & Plan Note (Signed)
Lab Results  Component Value Date   INR 2.23* 08/14/2012   INR 2.16* 08/13/2012   INR 2.16* 08/11/2012   Will check INR today. On warfarin 5mg  qd.

## 2012-08-21 ENCOUNTER — Other Ambulatory Visit: Payer: Self-pay | Admitting: Internal Medicine

## 2012-08-21 ENCOUNTER — Telehealth: Payer: Self-pay | Admitting: *Deleted

## 2012-08-21 ENCOUNTER — Telehealth: Payer: Self-pay | Admitting: Licensed Clinical Social Worker

## 2012-08-21 NOTE — Telephone Encounter (Signed)
Yes.  Thank you.

## 2012-08-21 NOTE — Telephone Encounter (Signed)
[facility] calls and ask if she is to check and report INR and PT. Does she need to do this Monday?

## 2012-08-21 NOTE — Telephone Encounter (Signed)
Ms. Poth was referred to CSW as pt is having difficulty monitoring her daily weights.  Pt would be insurance and dx eligible for [facility] care management.  CSW placed called to pt to discuss possible referral to [facility].  CSW left message requesting return call. CSW provided contact hours and phone number.  Referral for Home Health PT was completed through [facility], as pt is current with them for [facility] RN.

## 2012-08-24 ENCOUNTER — Other Ambulatory Visit: Payer: Self-pay | Admitting: Internal Medicine

## 2012-08-24 NOTE — Telephone Encounter (Signed)
CSW placed call to Ms. Altieri.  Discussed physicians request for [facility] referral and [facility] services.  Pt in agreement for referral.

## 2012-08-24 NOTE — Telephone Encounter (Signed)
Referral completed

## 2012-08-26 NOTE — Progress Notes (Signed)
Case discussed with Dr. Ziemer at the time of the visit.  We reviewed the resident's history and exam and pertinent patient test results.  I agree with the assessment, diagnosis, and plan of care documented in the resident's note.  

## 2012-08-30 ENCOUNTER — Inpatient Hospital Stay (HOSPITAL_COMMUNITY)
Admission: EM | Admit: 2012-08-30 | Discharge: 2012-09-06 | DRG: 292 | Disposition: A | Discharge status: Home Health | Payer: Medicare PPO | Attending: Internal Medicine | Admitting: Internal Medicine

## 2012-08-30 ENCOUNTER — Encounter (HOSPITAL_COMMUNITY): Payer: Self-pay | Admitting: *Deleted

## 2012-08-30 ENCOUNTER — Emergency Department (HOSPITAL_COMMUNITY): Payer: Medicare PPO

## 2012-08-30 DIAGNOSIS — I2789 Other specified pulmonary heart diseases: Secondary | ICD-10-CM | POA: Diagnosis present

## 2012-08-30 DIAGNOSIS — Z86718 Personal history of other venous thrombosis and embolism: Secondary | ICD-10-CM

## 2012-08-30 DIAGNOSIS — F3289 Other specified depressive episodes: Secondary | ICD-10-CM | POA: Diagnosis present

## 2012-08-30 DIAGNOSIS — M109 Gout, unspecified: Secondary | ICD-10-CM | POA: Diagnosis present

## 2012-08-30 DIAGNOSIS — I129 Hypertensive chronic kidney disease with stage 1 through stage 4 chronic kidney disease, or unspecified chronic kidney disease: Secondary | ICD-10-CM | POA: Diagnosis present

## 2012-08-30 DIAGNOSIS — I5033 Acute on chronic diastolic (congestive) heart failure: Secondary | ICD-10-CM

## 2012-08-30 DIAGNOSIS — I2510 Atherosclerotic heart disease of native coronary artery without angina pectoris: Secondary | ICD-10-CM

## 2012-08-30 DIAGNOSIS — I10 Essential (primary) hypertension: Secondary | ICD-10-CM | POA: Diagnosis present

## 2012-08-30 DIAGNOSIS — E119 Type 2 diabetes mellitus without complications: Secondary | ICD-10-CM | POA: Diagnosis present

## 2012-08-30 DIAGNOSIS — N390 Urinary tract infection, site not specified: Secondary | ICD-10-CM | POA: Diagnosis present

## 2012-08-30 DIAGNOSIS — Z8673 Personal history of transient ischemic attack (TIA), and cerebral infarction without residual deficits: Secondary | ICD-10-CM

## 2012-08-30 DIAGNOSIS — R0902 Hypoxemia: Secondary | ICD-10-CM | POA: Diagnosis present

## 2012-08-30 DIAGNOSIS — Z9089 Acquired absence of other organs: Secondary | ICD-10-CM

## 2012-08-30 DIAGNOSIS — E662 Morbid (severe) obesity with alveolar hypoventilation: Secondary | ICD-10-CM | POA: Diagnosis present

## 2012-08-30 DIAGNOSIS — I5043 Acute on chronic combined systolic (congestive) and diastolic (congestive) heart failure: Principal | ICD-10-CM | POA: Diagnosis present

## 2012-08-30 DIAGNOSIS — N183 Chronic kidney disease, stage 3 unspecified: Secondary | ICD-10-CM | POA: Diagnosis present

## 2012-08-30 DIAGNOSIS — J9610 Chronic respiratory failure, unspecified whether with hypoxia or hypercapnia: Secondary | ICD-10-CM | POA: Diagnosis present

## 2012-08-30 DIAGNOSIS — I4891 Unspecified atrial fibrillation: Secondary | ICD-10-CM | POA: Diagnosis present

## 2012-08-30 DIAGNOSIS — F329 Major depressive disorder, single episode, unspecified: Secondary | ICD-10-CM | POA: Diagnosis present

## 2012-08-30 DIAGNOSIS — K5900 Constipation, unspecified: Secondary | ICD-10-CM | POA: Diagnosis not present

## 2012-08-30 DIAGNOSIS — I359 Nonrheumatic aortic valve disorder, unspecified: Secondary | ICD-10-CM | POA: Diagnosis present

## 2012-08-30 DIAGNOSIS — Z86711 Personal history of pulmonary embolism: Secondary | ICD-10-CM

## 2012-08-30 DIAGNOSIS — J449 Chronic obstructive pulmonary disease, unspecified: Secondary | ICD-10-CM | POA: Diagnosis present

## 2012-08-30 DIAGNOSIS — I447 Left bundle-branch block, unspecified: Secondary | ICD-10-CM | POA: Diagnosis present

## 2012-08-30 DIAGNOSIS — Z7901 Long term (current) use of anticoagulants: Secondary | ICD-10-CM

## 2012-08-30 DIAGNOSIS — R001 Bradycardia, unspecified: Secondary | ICD-10-CM

## 2012-08-30 DIAGNOSIS — J4489 Other specified chronic obstructive pulmonary disease: Secondary | ICD-10-CM | POA: Diagnosis present

## 2012-08-30 DIAGNOSIS — Z6841 Body Mass Index (BMI) 40.0 and over, adult: Secondary | ICD-10-CM

## 2012-08-30 DIAGNOSIS — I495 Sick sinus syndrome: Secondary | ICD-10-CM | POA: Diagnosis present

## 2012-08-30 DIAGNOSIS — N179 Acute kidney failure, unspecified: Secondary | ICD-10-CM | POA: Diagnosis present

## 2012-08-30 DIAGNOSIS — K219 Gastro-esophageal reflux disease without esophagitis: Secondary | ICD-10-CM | POA: Diagnosis present

## 2012-08-30 DIAGNOSIS — Z9884 Bariatric surgery status: Secondary | ICD-10-CM

## 2012-08-30 DIAGNOSIS — E785 Hyperlipidemia, unspecified: Secondary | ICD-10-CM | POA: Diagnosis present

## 2012-08-30 DIAGNOSIS — I440 Atrioventricular block, first degree: Secondary | ICD-10-CM | POA: Diagnosis present

## 2012-08-30 DIAGNOSIS — I5023 Acute on chronic systolic (congestive) heart failure: Secondary | ICD-10-CM

## 2012-08-30 DIAGNOSIS — E890 Postprocedural hypothyroidism: Secondary | ICD-10-CM | POA: Diagnosis present

## 2012-08-30 DIAGNOSIS — Z87891 Personal history of nicotine dependence: Secondary | ICD-10-CM

## 2012-08-30 DIAGNOSIS — I350 Nonrheumatic aortic (valve) stenosis: Secondary | ICD-10-CM | POA: Diagnosis present

## 2012-08-30 DIAGNOSIS — D649 Anemia, unspecified: Secondary | ICD-10-CM | POA: Diagnosis present

## 2012-08-30 DIAGNOSIS — I509 Heart failure, unspecified: Secondary | ICD-10-CM | POA: Diagnosis present

## 2012-08-30 LAB — URINALYSIS, ROUTINE W REFLEX MICROSCOPIC
Ketones, ur: NEGATIVE mg/dL
Nitrite: POSITIVE — AB
Protein, ur: 30 mg/dL — AB
pH: 7.5 (ref 5.0–8.0)

## 2012-08-30 LAB — GLUCOSE, CAPILLARY
Glucose-Capillary: 139 mg/dL — ABNORMAL HIGH (ref 70–99)
Glucose-Capillary: 163 mg/dL — ABNORMAL HIGH (ref 70–99)
Glucose-Capillary: 121 mg/dL — ABNORMAL HIGH (ref 70–99)

## 2012-08-30 LAB — PRO B NATRIURETIC PEPTIDE: Pro B Natriuretic peptide (BNP): 1817 pg/mL — ABNORMAL HIGH (ref 0–450)

## 2012-08-30 LAB — POCT I-STAT, CHEM 8
Chloride: 100 mEq/L (ref 96–112)
Creatinine, Ser: 1.3 mg/dL — ABNORMAL HIGH (ref 0.50–1.10)
HCT: 34 % — ABNORMAL LOW (ref 36.0–46.0)
Hemoglobin: 11.6 g/dL — ABNORMAL LOW (ref 12.0–15.0)
Potassium: 4.4 mEq/L (ref 3.5–5.1)
Sodium: 140 mEq/L (ref 135–145)

## 2012-08-30 LAB — CBC WITH DIFFERENTIAL/PLATELET
Basophils Absolute: 0 10*3/uL (ref 0.0–0.1)
Basophils Relative: 0 % (ref 0–1)
Hemoglobin: 10.3 g/dL — ABNORMAL LOW (ref 12.0–15.0)
Lymphocytes Relative: 20 % (ref 12–46)
MCHC: 29.9 g/dL — ABNORMAL LOW (ref 30.0–36.0)
Neutro Abs: 7.6 10*3/uL (ref 1.7–7.7)
Neutrophils Relative %: 72 % (ref 43–77)
RDW: 16.2 % — ABNORMAL HIGH (ref 11.5–15.5)
WBC: 10.5 10*3/uL (ref 4.0–10.5)

## 2012-08-30 LAB — COMPREHENSIVE METABOLIC PANEL
AST: 15 U/L (ref 0–37)
Albumin: 3.3 g/dL — ABNORMAL LOW (ref 3.5–5.2)
Alkaline Phosphatase: 110 U/L (ref 39–117)
Chloride: 97 mEq/L (ref 96–112)
Potassium: 4 mEq/L (ref 3.5–5.1)
Sodium: 135 mEq/L (ref 135–145)
Total Bilirubin: 0.5 mg/dL (ref 0.3–1.2)
Total Protein: 7.1 g/dL (ref 6.0–8.3)

## 2012-08-30 LAB — POCT I-STAT TROPONIN I

## 2012-08-30 LAB — MAGNESIUM: Magnesium: 2.3 mg/dL (ref 1.5–2.5)

## 2012-08-30 MED ORDER — WARFARIN - PHARMACIST DOSING INPATIENT
Freq: Every day | Status: DC
  Administered 2012-09-02 – 2012-09-04 (×2)

## 2012-08-30 MED ORDER — ATORVASTATIN CALCIUM 40 MG PO TABS
40.0000 mg | ORAL_TABLET | Freq: Every day | ORAL | Status: DC
  Administered 2012-08-30 – 2012-09-06 (×8): 40 mg via ORAL
  Filled 2012-08-30 (×8): qty 1

## 2012-08-30 MED ORDER — FUROSEMIDE 10 MG/ML IJ SOLN
40.0000 mg | INTRAMUSCULAR | Status: AC
  Administered 2012-08-30: 40 mg via INTRAVENOUS
  Filled 2012-08-30: qty 4

## 2012-08-30 MED ORDER — INSULIN ASPART 100 UNIT/ML ~~LOC~~ SOLN
0.0000 [IU] | Freq: Every day | SUBCUTANEOUS | Status: DC
  Administered 2012-09-03: 2 [IU] via SUBCUTANEOUS

## 2012-08-30 MED ORDER — MONTELUKAST SODIUM 10 MG PO TABS
10.0000 mg | ORAL_TABLET | Freq: Every day | ORAL | Status: DC
  Administered 2012-08-30 – 2012-09-06 (×8): 10 mg via ORAL
  Filled 2012-08-30 (×8): qty 1

## 2012-08-30 MED ORDER — ASPIRIN EC 81 MG PO TBEC
81.0000 mg | DELAYED_RELEASE_TABLET | Freq: Every day | ORAL | Status: DC
  Administered 2012-08-30 – 2012-09-06 (×8): 81 mg via ORAL
  Filled 2012-08-30 (×8): qty 1

## 2012-08-30 MED ORDER — LOPERAMIDE HCL 2 MG PO CAPS: 2.0000 mg | ORAL_CAPSULE | ORAL | Status: DC | PRN

## 2012-08-30 MED ORDER — ALLOPURINOL 100 MG PO TABS
100.0000 mg | ORAL_TABLET | Freq: Two times a day (BID) | ORAL | Status: DC
  Administered 2012-08-30 – 2012-09-06 (×15): 100 mg via ORAL
  Filled 2012-08-30 (×16): qty 1

## 2012-08-30 MED ORDER — INSULIN ASPART 100 UNIT/ML ~~LOC~~ SOLN
0.0000 [IU] | Freq: Three times a day (TID) | SUBCUTANEOUS | Status: DC
  Administered 2012-08-30 (×3): 2 [IU] via SUBCUTANEOUS
  Administered 2012-08-31: 1 [IU] via SUBCUTANEOUS
  Administered 2012-08-31: 2 [IU] via SUBCUTANEOUS
  Administered 2012-08-31: 1 [IU] via SUBCUTANEOUS
  Administered 2012-09-01: 2 [IU] via SUBCUTANEOUS
  Administered 2012-09-01 (×2): 1 [IU] via SUBCUTANEOUS
  Administered 2012-09-02: 2 [IU] via SUBCUTANEOUS
  Administered 2012-09-02 (×2): 3 [IU] via SUBCUTANEOUS
  Administered 2012-09-03 (×2): 1 [IU] via SUBCUTANEOUS
  Administered 2012-09-04 (×3): 2 [IU] via SUBCUTANEOUS
  Administered 2012-09-05: 5 [IU] via SUBCUTANEOUS
  Administered 2012-09-05: 1 [IU] via SUBCUTANEOUS
  Administered 2012-09-06 (×2): 2 [IU] via SUBCUTANEOUS

## 2012-08-30 MED ORDER — SODIUM CHLORIDE 0.9 % IV SOLN: 250.0000 mL | INTRAVENOUS | Status: DC | PRN

## 2012-08-30 MED ORDER — WARFARIN SODIUM 5 MG PO TABS
5.0000 mg | ORAL_TABLET | Freq: Every day | ORAL | Status: DC
  Administered 2012-08-30 – 2012-09-01 (×3): 5 mg via ORAL
  Filled 2012-08-30 (×4): qty 1

## 2012-08-30 MED ORDER — POLYETHYLENE GLYCOL 3350 17 G PO PACK
17.0000 g | PACK | Freq: Once | ORAL | Status: AC
  Administered 2012-08-30: 17 g via ORAL
  Filled 2012-08-30: qty 1

## 2012-08-30 MED ORDER — SODIUM CHLORIDE 0.9 % IJ SOLN
3.0000 mL | Freq: Two times a day (BID) | INTRAMUSCULAR | Status: DC
  Administered 2012-08-30 – 2012-09-06 (×10): 3 mL via INTRAVENOUS

## 2012-08-30 MED ORDER — LEVOTHYROXINE SODIUM 150 MCG PO TABS
150.0000 ug | ORAL_TABLET | Freq: Every day | ORAL | Status: DC
  Administered 2012-08-30 – 2012-09-06 (×8): 150 ug via ORAL
  Filled 2012-08-30 (×11): qty 1

## 2012-08-30 MED ORDER — FLUOXETINE HCL 20 MG PO CAPS
20.0000 mg | ORAL_CAPSULE | Freq: Two times a day (BID) | ORAL | Status: DC
  Administered 2012-08-30 – 2012-09-06 (×15): 20 mg via ORAL
  Filled 2012-08-30 (×16): qty 1

## 2012-08-30 MED ORDER — ALBUTEROL SULFATE HFA 108 (90 BASE) MCG/ACT IN AERS
2.0000 | INHALATION_SPRAY | Freq: Four times a day (QID) | RESPIRATORY_TRACT | Status: DC | PRN
  Filled 2012-08-30: qty 6.7

## 2012-08-30 MED ORDER — DEXTROSE 5 % IV SOLN
1.0000 g | INTRAVENOUS | Status: DC
  Administered 2012-08-30 – 2012-09-01 (×3): 1 g via INTRAVENOUS
  Filled 2012-08-30 (×4): qty 10

## 2012-08-30 MED ORDER — GABAPENTIN 100 MG PO CAPS
100.0000 mg | ORAL_CAPSULE | Freq: Two times a day (BID) | ORAL | Status: DC
  Administered 2012-08-30 – 2012-09-06 (×15): 100 mg via ORAL
  Filled 2012-08-30 (×16): qty 1

## 2012-08-30 MED ORDER — SODIUM CHLORIDE 0.9 % IJ SOLN
3.0000 mL | Freq: Two times a day (BID) | INTRAMUSCULAR | Status: DC
  Administered 2012-08-30 – 2012-09-03 (×5): 3 mL via INTRAVENOUS

## 2012-08-30 MED ORDER — MOMETASONE FURO-FORMOTEROL FUM 100-5 MCG/ACT IN AERO
2.0000 | INHALATION_SPRAY | Freq: Two times a day (BID) | RESPIRATORY_TRACT | Status: DC
  Administered 2012-08-30 – 2012-09-06 (×15): 2 via RESPIRATORY_TRACT
  Filled 2012-08-30: qty 8.8

## 2012-08-30 MED ORDER — COLCHICINE 0.6 MG PO TABS: 0.6000 mg | ORAL_TABLET | ORAL | Status: DC

## 2012-08-30 MED ORDER — TIOTROPIUM BROMIDE MONOHYDRATE 18 MCG IN CAPS
18.0000 ug | ORAL_CAPSULE | Freq: Every day | RESPIRATORY_TRACT | Status: DC
  Administered 2012-08-30 – 2012-09-06 (×8): 18 ug via RESPIRATORY_TRACT
  Filled 2012-08-30 (×2): qty 5

## 2012-08-30 MED ORDER — SODIUM CHLORIDE 0.9 % IJ SOLN: 3.0000 mL | INTRAMUSCULAR | Status: DC | PRN

## 2012-08-30 MED ORDER — ASPIRIN 81 MG PO CHEW
324.0000 mg | CHEWABLE_TABLET | Freq: Once | ORAL | Status: AC
  Administered 2012-08-30: 324 mg via ORAL
  Filled 2012-08-30: qty 4

## 2012-08-30 MED ORDER — AMLODIPINE BESYLATE 5 MG PO TABS
5.0000 mg | ORAL_TABLET | Freq: Every day | ORAL | Status: DC
  Administered 2012-08-30 – 2012-09-03 (×5): 5 mg via ORAL
  Filled 2012-08-30 (×5): qty 1

## 2012-08-30 MED ORDER — ACETAMINOPHEN 500 MG PO TABS
1000.0000 mg | ORAL_TABLET | Freq: Two times a day (BID) | ORAL | Status: DC | PRN
  Filled 2012-08-30: qty 2

## 2012-08-30 MED ORDER — FUROSEMIDE 10 MG/ML IJ SOLN
80.0000 mg | Freq: Two times a day (BID) | INTRAMUSCULAR | Status: DC
  Administered 2012-08-30 (×2): 80 mg via INTRAVENOUS
  Filled 2012-08-30 (×4): qty 8

## 2012-08-30 MED ORDER — PANTOPRAZOLE SODIUM 40 MG PO TBEC
40.0000 mg | DELAYED_RELEASE_TABLET | Freq: Every day | ORAL | Status: DC
  Administered 2012-08-30 – 2012-09-06 (×8): 40 mg via ORAL
  Filled 2012-08-30 (×8): qty 1

## 2012-08-30 NOTE — H&P (Signed)
Date: 08/30/2012               Patient Name:  [name] MRN: [id number]  DOB: [birth year] Age / Sex: 79 y.o., female   PCP: Aletta Edouard, MD         Medical Service: Internal Medicine Teaching Service         Attending Physician: Dr. Vida Roller, MD    First Contact: Dr. Mikey Bussing Pager: [phone]  Second Contact: Dr. Everardo Beals Pager: [phone]       After Hours (After 5p/  First Contact Pager: [phone]  weekends / holidays): Second Contact Pager: [phone]   Chief Complaint: SOB  History of Present Illness:   [name] is a 79 y.o. female with a pmhx of CHF, AS, Afib on warfarin, CKD, CVA, and PE who presented to the ED with a cc of SOB. Since last being d/c from [facility] on 08/15/12, the patient had been doing well. However, at 1 am the patient began to cough and feel increasing SOB.  The patient states that the problem began about an hour after [name] laid down to go to bed for the night. The patient states that this episode felt similar to her previous experiences with having fluid "on her lungs". [name] denies chest pain, headache, and lightheadedness. [name] does not have a scale that [name] can weigh herself at home, but [name] thinks that her weight has not changed much since discharge. Of note, the patient reports that [name] has been compliant with her medications since discharge.  On ROS, the patient endorses dysuria for 5 days. The patient states that [name] thinks that [name] has a UTI.  Meds: Current Facility-Administered Medications  Medication Dose Route Frequency Provider Last Rate Last Dose  . furosemide (LASIX) injection 40 mg  40 mg Intravenous STAT Lorretta Harp, MD       Current Outpatient Prescriptions  Medication Sig Dispense Refill  . acetaminophen (TYLENOL) 500 MG tablet Take 1,000 mg by mouth 2 (two) times daily as needed. For pain.      Marland Kitchen albuterol (PROVENTIL HFA) 108 (90 BASE) MCG/ACT inhaler Inhale 2 puffs into the lungs every 6 (six) hours as needed. For wheezing.  3.7  g  11  . allopurinol (ZYLOPRIM) 100 MG tablet Take 1 tablet (100 mg total) by mouth 2 (two) times daily.  60 tablet  3  . amLODipine (NORVASC) 5 MG tablet Take 5 mg by mouth daily.      Marland Kitchen aspirin EC 81 MG EC tablet Take 1 tablet (81 mg total) by mouth daily.      Marland Kitchen atorvastatin (LIPITOR) 40 MG tablet Take 40 mg by mouth daily.      . colchicine 0.6 MG tablet Take 0.6 mg by mouth See admin instructions. Take for 3 days when gout flares up      . FLUoxetine (PROZAC) 20 MG capsule Take 20 mg by mouth 2 (two) times daily.      . Fluticasone-Salmeterol (ADVAIR) 250-50 MCG/DOSE AEPB Inhale 1 puff into the lungs every 12 (twelve) hours.      . furosemide (LASIX) 40 MG tablet Take 40-80 mg by mouth 2 (two) times daily. 80 mg in the morning and 40 mg at night      . gabapentin (NEURONTIN) 100 MG capsule Take 100 mg by mouth 2 (two) times daily.      Marland Kitchen glipiZIDE (GLUCOTROL) 10 MG tablet Take 5 mg by mouth 2 (two) times daily before a meal.      .  levothyroxine (SYNTHROID) 150 MCG tablet Take 1 tablet (150 mcg total) by mouth daily.  30 tablet  11  . montelukast (SINGULAIR) 10 MG tablet Take 1 tablet (10 mg total) by mouth daily.  30 tablet  2  . omeprazole (PRILOSEC) 20 MG capsule Take 40 mg by mouth 2 (two) times daily.      Marland Kitchen SPIRIVA HANDIHALER 18 MCG inhalation capsule PLACE ONE CAPSULE INTO DEVICE AND INHALE 1 PUFF DAILY  30 capsule  11  . loperamide (IMODIUM) 2 MG capsule Take 1 capsule (2 mg total) by mouth as needed for diarrhea or loose stools (up to total 16mg  daily).  8 capsule  0  . warfarin (COUMADIN) 5 MG tablet Take 5 mg by mouth daily.        Allergies: Allergies as of 08/30/2012 - Review Complete 08/30/2012  Allergen Reaction Noted  . Lantus (insulin glargine)  05/13/2012  . Nitrofurantoin (macrodantin) Itching 04/20/2010   Past Medical History  Diagnosis Date  . Coronary atherosclerosis of native coronary artery     Mild at cateterization 2006  . Abdominal pain     Likely  secondary to presbyesophagus and gastric dysmotility  . Chronic diastolic heart failure   . Respiratory failure, chronic     Mixed etiology with bronchospastic component  . Urinary incontinence     Recurrent uti's/resistance to cipro, bactrim  . Gout   . Postablative hypothyroidism     H/o Graves disease s/p radioactive iodine ablation with resultant postablative hypothyroidism  . Hyperlipidemia   . Depression   . Morbidly obese     s/p gastric plication surgery  . Gastroesophageal reflux disease   . Ventral hernia     Repair in April 2008, complicated by MRSA abdominal wall cellulitis  . DVT (deep venous thrombosis) 1998  . Pulmonary embolism 1998    Greenfield filter  . Diabetes mellitus type 2, controlled, with complications   . Essential hypertension, benign   . AF (atrial fibrillation)     on chronic warfarin  . Osteoarthritis   . Stroke 1997    Denies residual  . Anemia     Blood transfusion [V58.2]  . Supratherapeutic INR 05/15/2012    Possibly induced by recent steroid use.   . Chest pain at rest 08/07/2012   Past Surgical History  Procedure Laterality Date  . Cholecystectomy    . Appendectomy    . Breast biopsy    . Gastric bypass  1970's  . Hernia repair  05/2006    ventral hernia  . Greenfield filter placement  1998   Family History  Problem Relation Age of Onset  . Colon cancer Neg Hx    History   Social History  . Marital Status: Widowed    Spouse Name: N/A    Number of Children: N/A  . Years of Education: N/A   Occupational History  . Not on file.   Social History Main Topics  . Smoking status: Former Smoker -- 1.00 packs/day for 1.5 years    Types: Cigarettes  . Smokeless tobacco: Never Used  . Alcohol Use: No  . Drug Use: No  . Sexually Active: No   Other Topics Concern  . Not on file   Social History Narrative   [name] lives w/her son. Her son offers help and [name] has an aid who occasionally cooks for her and takes care of some other  tasks around the pt's house.   Has medicare. Has son in [place] who is POA  Ambivalent about DNR issues and does not want a STOP sign posted in home   Home Health Aid visits 6 days a week             Review of Systems: Review of Systems  Constitutional: Negative for fever, chills and diaphoresis.  HENT: Negative.   Eyes: Negative.   Respiratory: Positive for cough and shortness of breath. Negative for hemoptysis, sputum production and wheezing.   Cardiovascular: Positive for orthopnea and PND. Negative for chest pain, palpitations and leg swelling.  Gastrointestinal: Negative.   Genitourinary: Positive for dysuria, urgency and frequency. Negative for hematuria and flank pain.  Musculoskeletal: Negative.   Skin: Negative for itching and rash.  Neurological: Negative.   Psychiatric/Behavioral: Negative.      Physical Exam: Blood pressure 151/51, pulse 64, temperature 99 F (37.2 C), temperature source Rectal, resp. rate 24, height 5' (1.524 m), weight 273 lb (123.832 kg), last menstrual period 05/22/1968, SpO2 98.00%.  Physical Exam  Constitutional: [name] is oriented to person, place, and time. [name] appears well-developed and well-nourished. No distress. [name] is not intubated.  HENT:  Head: Normocephalic and atraumatic.  Eyes: Conjunctivae and EOM are normal. Pupils are equal, round, and reactive to light.  Neck: Normal range of motion. No tracheal deviation present.  Cardiovascular: Normal rate and regular rhythm.  Exam reveals distant heart sounds.   Pulmonary/Chest: No accessory muscle usage. Tachypnea noted. No apnea and not bradypneic. [name] is not intubated. [name] is in respiratory distress. [name] has decreased breath sounds in the right upper field, the right middle field, the right lower field, the left upper field, the left middle field and the left lower field. [name] has no wheezes. [name] has no rales.  Neurological: [name] is alert and oriented to person, place, and time.  Skin: Skin is  warm and dry. [name] is not diaphoretic. No erythema. No pallor.  Psychiatric: [name] has a normal mood and affect. Her behavior is normal. Judgment and thought content normal.     Lab results: Basic Metabolic Panel:  Recent Labs  16/10/96 0239  NA 140  K 4.4  CL 100  GLUCOSE 189*  BUN 37*  CREATININE 1.30*   CBC:  Recent Labs  08/30/12 0230 08/30/12 0239  WBC 10.5  --   NEUTROABS 7.6  --   HGB 10.3* 11.6*  HCT 34.4* 34.0*  MCV 87.5  --   PLT 277  --    Cardiac Enzymes: No results found for this basename: CKTOTAL, CKMB, CKMBINDEX, TROPONINI,  in the last 72 hours  BNP:  Recent Labs  08/30/12 0230  PROBNP 1817.0*   Coagulation:  Recent Labs  08/30/12 0230  LABPROT 26.5*  INR 2.54*   Urinalysis:  Recent Labs  08/30/12 0302  COLORURINE YELLOW  LABSPEC 1.017  PHURINE 7.5  GLUCOSEU NEGATIVE  HGBUR SMALL*  BILIRUBINUR NEGATIVE  KETONESUR NEGATIVE  PROTEINUR 30*  UROBILINOGEN 0.2  NITRITE POSITIVE*  LEUKOCYTESUR LARGE*   Misc. Labs:  Urine Culture: Not available at time of H&P.  Imaging results:  Dg Chest Port 1 View  08/30/2012   *RADIOLOGY REPORT*  Clinical Data: Chest pain.  PORTABLE CHEST - 1 VIEW  Comparison: 08/17/2012  Findings: Shallow inspiration.  Cardiac enlargement with mild pulmonary vascular congestion.  Perihilar interstitial changes suggest mild interstitial edema.  No focal consolidation.  No blunting of costophrenic angles.  No pneumothorax.  Calcified and tortuous aorta.  No significant changes since the previous study.  IMPRESSION: Cardiac enlargement with mild pulmonary  vascular congestion and mild interstitial edema.   Original Report Authenticated By: Burman Nieves, M.D.    Other results: EKG: Sinus, first degree AV block. LBBB, so significant change from baseline EKG from 08/12/12.  Assessment & Plan by Problem:  [name] is a 79 yo F with history of COPD, dCHF, afib on coumadin, and stage 2 CKD admitted to [facility] on  08/12/12 due to worsening acute on chronic SOB.   Problem Plan  SOB: The patient's SOB is most likely due to acute dCHF exacerbation. Alhough her weight is only up 1 lb since discharge on 6/21 (272-->273 LBs), [name] has mildly elevated pBNP and her CXR has pulmonary vascular congestion. The precipitant remains unclear as [name] reports compliance with meds and denies dietary indiscretions. We plan to rule out ACS. Most recent TSH WNL on 06/26/12. [name] has history of mod-severe AS based on echo in 03/2012. Signs & symptoms not typical of PNA or COPD exacerbation, though [name] has a mild leukocytosis. PE unlikely given therapeutic anticoagulation. May require increased lasix in setting of worsening CKD.  - Admit to telemetry bed - Lasix 80mg  IV BID (home dose: 80 mg in AM, 40 mg in PM) - Monitor closely given preload dependence due to AS  - Cycle CE (initial istat negative)  - Strict I/Os and daily weights  - F/U BMP and Mg level in morning.  Aortic Stenosis: Recent 2D echo showed EF of 55% to 60% and moderate to severe stenosis. Valve area is 0.55cm^2(VTI) and 0.54cm^2 (Vmax). [name] was evaluated by cardiologist in her previous admission. Cardiology [facility]) felt AS was not critical and suggested following longitudinally and consider TAVR  - May consult cardiology for further evaluation of aortic stenosis.   UTI: UA significant for too many to count bacteria, large leukocytes, and positive nitrite. - IV Ceftriaxone - F/U urine culture.   Stage 3 CKD:  Stable (GFR 46). Cre 1.06 on 6/21-->1.30. Likely due to decreased renal perfusion in the setting of dCHF exacerabation. [name] has symptoms for UTI and positive UA, a UTI which may have also contributed to worsening renal function.  - F/U daily Cr and lytes - If kidney function worsens, consider discontinuation of colchicine and other potentially nephrotoxic drugs.  COPD:  Stable, at baseline. On 4L home O2. Clinical presentation not typical of COPD exacerbation.  -  Continue O2  - Continue albuterol prn  - Substitute Dulera for Advair (formulary)   Atrial Fibrillation:  with h/o sick sinus syndrome with recurrent bradycardia and intolerance to beta blockers. Asymptomatic. On chronic anticoagulation with therapeutic INR.  - Coumadin per pharmacy   Normocytic Anemia: Baseline Hb seems to be around 11-12. Hb 11.6 at admission, stable from hospital discharge one week prior.   - F/U CBC in morning   HTN:  Stable.  - Continue amlodipine 5 & lasix as above  Hypothyroidism:  TSH 4.144 (wnl) on 06/26/12.  - Continue home Levothyroxine 150 mcg daily   DM II: Well controlled on home meds (glipizide 5mg  dialy). HgA1c 6.4  (06/26/12):  - Hold home glipizide  - Sensitive SSI - Continue Gabapentin   Depression:  Stable without SI/HI.  -Continue Prozac   VTE ppx:  - Anticoagulation (coumadin)   F/E/N F: Hold fluids (strict I/O's) E: Daily lytes N: Diabetic carb modified with salt restriction  Code Status:  Full code     Dispo: Disposition is deferred at this time, awaiting improvement of current medical problems. Anticipated discharge in approximately 3-4  day(s).   The patient does have a current PCP (Aletta Edouard, MD) and does need an [facility] hospital follow-up appointment after discharge.  The patient does not know have transportation limitations that hinder transportation to clinic appointments.  Signed: Pleas Koch, MD 08/30/2012, 4:23 AM

## 2012-08-30 NOTE — Progress Notes (Signed)
ANTICOAGULATION CONSULT NOTE - Initial Consult  Pharmacy Consult for Coumadin Indication: atrial fibrillation  Allergies  Allergen Reactions  . Lantus (Insulin Glargine)     Causes a lot of itching  . Nitrofurantoin (Macrodantin) Itching    Patient Measurements: Height: 5' (152.4 cm) Weight: 268 lb 14.4 oz (121.972 kg) IBW/kg (Calculated) : 45.5  Vital Signs: Temp: 97.5 F (36.4 C) (07/06 0606) Temp src: Rectal (07/06 0300) BP: 130/56 mmHg (07/06 0606) Pulse Rate: 61 (07/06 0606)  Labs:  Recent Labs  08/30/12 0230 08/30/12 0239  HGB 10.3* 11.6*  HCT 34.4* 34.0*  PLT 277  --   LABPROT 26.5*  --   INR 2.54*  --   CREATININE  --  1.30*    Estimated Creatinine Clearance: 42.8 ml/min (by C-G formula based on Cr of 1.3).   Medical History: Past Medical History  Diagnosis Date  . Coronary atherosclerosis of native coronary artery     Mild at cateterization 2006  . Abdominal pain     Likely secondary to presbyesophagus and gastric dysmotility  . Chronic diastolic heart failure   . Respiratory failure, chronic     Mixed etiology with bronchospastic component  . Urinary incontinence     Recurrent uti's/resistance to cipro, bactrim  . Gout   . Postablative hypothyroidism     H/o Graves disease s/p radioactive iodine ablation with resultant postablative hypothyroidism  . Hyperlipidemia   . Depression   . Morbidly obese     s/p gastric plication surgery  . Gastroesophageal reflux disease   . Ventral hernia     Repair in April 2008, complicated by MRSA abdominal wall cellulitis  . DVT (deep venous thrombosis) 1998  . Pulmonary embolism 1998    Greenfield filter  . Diabetes mellitus type 2, controlled, with complications   . Essential hypertension, benign   . AF (atrial fibrillation)     on chronic warfarin  . Osteoarthritis   . Stroke 1997    Denies residual  . Anemia     Blood transfusion [V58.2]  . Supratherapeutic INR 05/15/2012    Possibly induced by  recent steroid use.   . Chest pain at rest 08/07/2012  . Diabetes mellitus without complication     Medications:  Prescriptions prior to admission  Medication Sig Dispense Refill  . acetaminophen (TYLENOL) 500 MG tablet Take 1,000 mg by mouth 2 (two) times daily as needed. For pain.      Marland Kitchen albuterol (PROVENTIL HFA) 108 (90 BASE) MCG/ACT inhaler Inhale 2 puffs into the lungs every 6 (six) hours as needed. For wheezing.  3.7 g  11  . allopurinol (ZYLOPRIM) 100 MG tablet Take 1 tablet (100 mg total) by mouth 2 (two) times daily.  60 tablet  3  . amLODipine (NORVASC) 5 MG tablet Take 5 mg by mouth daily.      Marland Kitchen aspirin EC 81 MG EC tablet Take 1 tablet (81 mg total) by mouth daily.      Marland Kitchen atorvastatin (LIPITOR) 40 MG tablet Take 40 mg by mouth daily.      . colchicine 0.6 MG tablet Take 0.6 mg by mouth See admin instructions. Take for 3 days when gout flares up      . FLUoxetine (PROZAC) 20 MG capsule Take 20 mg by mouth 2 (two) times daily.      . Fluticasone-Salmeterol (ADVAIR) 250-50 MCG/DOSE AEPB Inhale 1 puff into the lungs every 12 (twelve) hours.      . furosemide (LASIX) 40 MG  tablet Take 40-80 mg by mouth 2 (two) times daily. 80 mg in the morning and 40 mg at night      . gabapentin (NEURONTIN) 100 MG capsule Take 100 mg by mouth 2 (two) times daily.      Marland Kitchen glipiZIDE (GLUCOTROL) 10 MG tablet Take 5 mg by mouth 2 (two) times daily before a meal.      . levothyroxine (SYNTHROID) 150 MCG tablet Take 1 tablet (150 mcg total) by mouth daily.  30 tablet  11  . montelukast (SINGULAIR) 10 MG tablet Take 1 tablet (10 mg total) by mouth daily.  30 tablet  2  . omeprazole (PRILOSEC) 20 MG capsule Take 40 mg by mouth 2 (two) times daily.      Marland Kitchen SPIRIVA HANDIHALER 18 MCG inhalation capsule PLACE ONE CAPSULE INTO DEVICE AND INHALE 1 PUFF DAILY  30 capsule  11  . loperamide (IMODIUM) 2 MG capsule Take 1 capsule (2 mg total) by mouth as needed for diarrhea or loose stools (up to total 16mg  daily).  8  capsule  0  . warfarin (COUMADIN) 5 MG tablet Take 5 mg by mouth daily.       Scheduled:  . allopurinol  100 mg Oral BID  . amLODipine  5 mg Oral Daily  . aspirin EC  81 mg Oral Daily  . atorvastatin  40 mg Oral Daily  . cefTRIAXone (ROCEPHIN)  IV  1 g Intravenous Q24H  . FLUoxetine  20 mg Oral BID  . furosemide  80 mg Intravenous BID  . gabapentin  100 mg Oral BID  . insulin aspart  0-5 Units Subcutaneous QHS  . insulin aspart  0-9 Units Subcutaneous TID WC  . levothyroxine  150 mcg Oral QAC breakfast  . mometasone-formoterol  2 puff Inhalation BID  . montelukast  10 mg Oral Daily  . pantoprazole  40 mg Oral Daily  . sodium chloride  3 mL Intravenous Q12H  . sodium chloride  3 mL Intravenous Q12H  . tiotropium  18 mcg Inhalation Daily  . warfarin  5 mg Oral q1800  . Warfarin - Pharmacist Dosing Inpatient   Does not apply q1800    Assessment: 79yo female c/o painful, strong-smelling urination for several days, to continue Coumadin for Afib during admission; INR therapeutic.  Goal of Therapy:  INR 2-3   Plan:  Will continue home Coumadin dose of 5mg  daily and monitor INR.  Vernard Gambles, PharmD, BCPS  08/30/2012,6:35 AM

## 2012-08-30 NOTE — Progress Notes (Signed)
Subjective: Patient seen at bedside,  Reports feeling better than when she came in.  Reports no fever or chills since admission.  Denies any abdominal or urinary pain (has catheter in place).  Reports she is hungry and breakfast had not yet been brought. Objective: Vital signs in last 24 hours: Filed Vitals:   08/30/12 0500 08/30/12 0530 08/30/12 0606 08/30/12 1011  BP: 106/82 127/56 130/56 130/52  Pulse: 62 54 61   Temp:   97.5 F (36.4 C)   TempSrc:      Resp: 17 18 20    Height:   5' (1.524 m)   Weight:   268 lb 14.4 oz (121.972 kg)   SpO2: 97% 98% 95%    Weight change:   Intake/Output Summary (Last 24 hours) at 08/30/12 1256 Last data filed at 08/30/12 1023  Gross per 24 hour  Intake      6 ml  Output    575 ml  Net   -569 ml   General: resting in bed HEENT: EOMI, no scleral icterus Cardiac: RRR, distant heart sounds no murmur appreciated Pulm: Mildly decreased breath sounds b/l, crackles present over RLL lung fields Abd: obese, soft, nontender, nondistended, BS present Ext: warm and well perfused, no pedal edema Neuro: alert and oriented X3  Lab Results: Basic Metabolic Panel:  Recent Labs Lab 08/30/12 0239 08/30/12 0743  NA 140 135  K 4.4 4.0  CL 100 97  CO2  --  30  GLUCOSE 189* 143*  BUN 37* 36*  CREATININE 1.30* 1.17*  CALCIUM  --  9.5  MG  --  2.3   Liver Function Tests:  Recent Labs Lab 08/30/12 0743  AST 15  ALT 13  ALKPHOS 110  BILITOT 0.5  PROT 7.1  ALBUMIN 3.3*   CBC:  Recent Labs Lab 08/30/12 0230 08/30/12 0239  WBC 10.5  --   NEUTROABS 7.6  --   HGB 10.3* 11.6*  HCT 34.4* 34.0*  MCV 87.5  --   PLT 277  --    Cardiac Enzymes:  Recent Labs Lab 08/30/12 0743 08/30/12 1115  TROPONINI <0.30 <0.30   BNP:  Recent Labs Lab 08/30/12 0230  PROBNP 1817.0*   CBG:  Recent Labs Lab 08/30/12 0743 08/30/12 1152  GLUCAP 139* 154*   Coagulation:  Recent Labs Lab 08/30/12 0230  LABPROT 26.5*  INR 2.54*    Urinalysis:  Recent Labs Lab 08/30/12 0302  COLORURINE YELLOW  LABSPEC 1.017  PHURINE 7.5  GLUCOSEU NEGATIVE  HGBUR SMALL*  BILIRUBINUR NEGATIVE  KETONESUR NEGATIVE  PROTEINUR 30*  UROBILINOGEN 0.2  NITRITE POSITIVE*  LEUKOCYTESUR LARGE*     Studies/Results: Dg Chest Port 1 View  08/30/2012   *RADIOLOGY REPORT*  Clinical Data: Chest pain.  PORTABLE CHEST - 1 VIEW  Comparison: 08/17/2012  Findings: Shallow inspiration.  Cardiac enlargement with mild pulmonary vascular congestion.  Perihilar interstitial changes suggest mild interstitial edema.  No focal consolidation.  No blunting of costophrenic angles.  No pneumothorax.  Calcified and tortuous aorta.  No significant changes since the previous study.  IMPRESSION: Cardiac enlargement with mild pulmonary vascular congestion and mild interstitial edema.   Original Report Authenticated By: Burman Nieves, M.D.   Medications: I have reviewed the patient's current medications. Scheduled Meds: . allopurinol  100 mg Oral BID  . amLODipine  5 mg Oral Daily  . aspirin EC  81 mg Oral Daily  . atorvastatin  40 mg Oral Daily  . cefTRIAXone (ROCEPHIN)  IV  1  g Intravenous Q24H  . FLUoxetine  20 mg Oral BID  . furosemide  80 mg Intravenous BID  . gabapentin  100 mg Oral BID  . insulin aspart  0-5 Units Subcutaneous QHS  . insulin aspart  0-9 Units Subcutaneous TID WC  . levothyroxine  150 mcg Oral QAC breakfast  . mometasone-formoterol  2 puff Inhalation BID  . montelukast  10 mg Oral Daily  . pantoprazole  40 mg Oral Daily  . sodium chloride  3 mL Intravenous Q12H  . sodium chloride  3 mL Intravenous Q12H  . tiotropium  18 mcg Inhalation Daily  . warfarin  5 mg Oral q1800  . Warfarin - Pharmacist Dosing Inpatient   Does not apply q1800   Continuous Infusions: none PRN Meds:.sodium chloride, acetaminophen, albuterol, loperamide, sodium chloride Assessment/Plan:   Acute on chronic systolic CHF (congestive heart failure) with  AS -Recent 2D echo showed EF of 55% to 60% and moderate to severe stenosis. Valve area is 0.55cm^2(VTI) and 0.54cm^2 (Vmax). She was evaluated by cardiologist in her previous admission. Cardiology [facility]) felt AS was not critical and suggested following longitudinally and consider TAVR -Patient has increase in Cr from baseline, patient's UTI may have cause some acute kidney injury and caused her to become fluid overloaded. -Patient currently on lasix 80mg  IV BID, currently reporting less SOB, able to lie flat without SOB, lung exam improved today.  Will consider switching back to home lasix dose tomorrow.   UTI -Continue IV rocephin, awaiting cultures. -Cr elevated to 1.3 from 1.06 baseline, trending down today to 1.17.   DIABETES MELLITUS -Blood glucose 189 on admission, 143 this AM. -Continue to hold home meds and continue SSI.   HYPERTENSION -Continue amlodipine 5 and lasix   Atrial fibrillation -Coumadin per pharm   COPD -O2 supplement to maintain saturation >92% -Albuterol prn -Advair   CKD (chronic kidney disease) stage 3, GFR 30-59 ml/min -Patient Cr 1.3 on admission, likely increased due to UTI, now on abx Cr trending down.        DVT PPx: A/C (coumadin) Dispo: Disposition is deferred at this time, awaiting improvement of current medical problems. Anticipated discharge in approximately 3-4 day(s).  The patient does have a current PCP (Aletta Edouard, MD) and does need an [facility] hospital follow-up appointment after discharge.  The patient does not know have transportation limitations that hinder transportation to clinic appointments.   .Services Needed at time of discharge: Y = Yes, Blank = No PT:   OT:   RN:   Equipment:   Other:     LOS: 0 days   Carlynn Purl, DO 08/30/2012, 12:56 PM

## 2012-08-30 NOTE — ED Notes (Signed)
Patient arrived via EMS - c/o SOB was able to walk to the stretcher skin warm and dry, no edema  130/78

## 2012-08-30 NOTE — ED Notes (Signed)
Patient states she has been having painful urination, strong smelling urine and freq for the past several days

## 2012-08-30 NOTE — H&P (Signed)
I saw and evaluated the patient. I reviewed the resident's note and confirmed the resident's findings. I agree with the assessment and plan as documented in the resident's note.  Briefly, [name] is a 79yo woman with PMH of CHF, mod-sev AS, Afib on coumadin, CKD who presents c/o SOB.  She reports that she was lying down to sleep when she felt her throat get sore, she had to cough and then became SOB.  She notes that she has had similar symptoms when "fluid is on" her lungs.  She specifically denies DOE, exertional dyspnea or chest pain.  Her only other symptoms are related to dysuria, urgency and frequency which she has been having for about 5 days.   Exam: She is an obese woman, lying flat, no acute distress.  Heart sounds are distant but with a normal rate.  On pulmonary exam, she is no longer in distress.  She has decreased breath sounds throughout and some mild crackles.  No wheezing.  Abdomen is soft and NT.  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  She notes no significant pain at this site. .   Her labs reveal an elevated Cr to 1.3 (up from baseline), a chronic anemia with Hgb of 10.3, pro BNP of 1817, up from baseline around 1000, INR of 2.54, UA with + nitrite, LE and small Hgb, culture is pending.  CXR revealed some mild congestion.  Plan -   For her SOB, this does appear to be a mild CHF exacerbation.  She was placed on an increased dose of lasix IV 80mg  BID.  She is now able to lie flat without SOB.  There is also concern for symptomatic AS, but she specifically denies exertional dyspnea, chest pain or dizziness at this time.  Currently, the working diagnosis would be UTI causing some AKI and pushing her in to a mild volume overload.  Would continue the IV diuretics for one more day, switching back to home dose tomorrow.   For the UTI, would continue treatment with IV rocephin awaiting culture results.  For  her AS, this appears to be at baseline, but she will need close monitoring while diuresing to ensure adequate preload.  Acute on Chronic CKD: Likely related to issues above, close monitoring and limit IVF given CHF.   DMII: Hold oral therapy, SSI   For further discussion and issues, please see resident note.

## 2012-08-30 NOTE — ED Provider Notes (Signed)
History    CSN: [id number] Arrival date & time 08/30/12  0204  First MD Initiated Contact with Patient 08/30/12 0211     Chief Complaint  Patient presents with  . Shortness of Breath   (Consider location/radiation/quality/duration/timing/severity/associated sxs/prior Treatment) HPI Comments: 79 year old female with a history of congestive heart failure, renal insufficiency, COPD, moderate to severe aortic stenosis and a history of atrial fibrillation. She presents to the hospital this evening with shortness of breath which she states started 30 minutes prior to arrival. This has been persistent, severe, associated with orthopnea but no peripheral swelling, no coughs fevers or chills, no back pain. She states that this is similar to prior episodes of CHF exacerbations. The paramedics found the patient in respiratory distress with altered mental status and oxygenation level of 82% on 2 L by nasal cannula. Of note the patient is supposed to be on 4 L of nasal cannula. They gave her an albuterol treatment, there is no significant change in her mentation, supplemental oxygen was increased to 4 L and resultant hypoxia resolved.  Patient is a 79 y.o. female presenting with shortness of breath. The history is provided by the patient, medical records and the EMS personnel.  Shortness of Breath  Past Medical History  Diagnosis Date  . Coronary atherosclerosis of native coronary artery     Mild at cateterization 2006  . Abdominal pain     Likely secondary to presbyesophagus and gastric dysmotility  . Chronic diastolic heart failure   . Respiratory failure, chronic     Mixed etiology with bronchospastic component  . Urinary incontinence     Recurrent uti's/resistance to cipro, bactrim  . Gout   . Postablative hypothyroidism     H/o Graves disease s/p radioactive iodine ablation with resultant postablative hypothyroidism  . Hyperlipidemia   . Depression   . Morbidly obese     s/p gastric  plication surgery  . Gastroesophageal reflux disease   . Ventral hernia     Repair in April 2008, complicated by MRSA abdominal wall cellulitis  . DVT (deep venous thrombosis) 1998  . Pulmonary embolism 1998    Greenfield filter  . Diabetes mellitus type 2, controlled, with complications   . Essential hypertension, benign   . AF (atrial fibrillation)     on chronic warfarin  . Osteoarthritis   . Stroke 1997    Denies residual  . Anemia     Blood transfusion [V58.2]  . Supratherapeutic INR 05/15/2012    Possibly induced by recent steroid use.   . Chest pain at rest 08/07/2012   Past Surgical History  Procedure Laterality Date  . Cholecystectomy    . Appendectomy    . Breast biopsy    . Gastric bypass  1970's  . Hernia repair  05/2006    ventral hernia  . Greenfield filter placement  1998   Family History  Problem Relation Age of Onset  . Colon cancer Neg Hx    History  Substance Use Topics  . Smoking status: Former Smoker -- 1.00 packs/day for 1.5 years    Types: Cigarettes  . Smokeless tobacco: Never Used  . Alcohol Use: No   OB History   Grav Para Term Preterm Abortions TAB SAB Ect Mult Living                 Review of Systems  Respiratory: Positive for shortness of breath.   All other systems reviewed and are negative.    Allergies  Lantus  and Nitrofurantoin  Home Medications   Current Outpatient Rx  Name  Route  Sig  Dispense  Refill  . acetaminophen (TYLENOL) 500 MG tablet   Oral   Take 1,000 mg by mouth 2 (two) times daily as needed. For pain.         Marland Kitchen albuterol (PROVENTIL HFA) 108 (90 BASE) MCG/ACT inhaler   Inhalation   Inhale 2 puffs into the lungs every 6 (six) hours as needed. For wheezing.   3.7 g   11   . allopurinol (ZYLOPRIM) 100 MG tablet   Oral   Take 1 tablet (100 mg total) by mouth 2 (two) times daily.   60 tablet   3   . amLODipine (NORVASC) 5 MG tablet   Oral   Take 5 mg by mouth daily.         Marland Kitchen aspirin EC 81 MG EC  tablet   Oral   Take 1 tablet (81 mg total) by mouth daily.         Marland Kitchen atorvastatin (LIPITOR) 40 MG tablet   Oral   Take 40 mg by mouth daily.         . colchicine 0.6 MG tablet   Oral   Take 0.6 mg by mouth See admin instructions. Take for 3 days when gout flares up         . FLUoxetine (PROZAC) 20 MG capsule   Oral   Take 20 mg by mouth 2 (two) times daily.         . Fluticasone-Salmeterol (ADVAIR) 250-50 MCG/DOSE AEPB   Inhalation   Inhale 1 puff into the lungs every 12 (twelve) hours.         . furosemide (LASIX) 40 MG tablet   Oral   Take 40-80 mg by mouth 2 (two) times daily. 80 mg in the morning and 40 mg at night         . gabapentin (NEURONTIN) 100 MG capsule   Oral   Take 100 mg by mouth 2 (two) times daily.         Marland Kitchen glipiZIDE (GLUCOTROL) 10 MG tablet   Oral   Take 5 mg by mouth 2 (two) times daily before a meal.         . levothyroxine (SYNTHROID) 150 MCG tablet   Oral   Take 1 tablet (150 mcg total) by mouth daily.   30 tablet   11   . montelukast (SINGULAIR) 10 MG tablet   Oral   Take 1 tablet (10 mg total) by mouth daily.   30 tablet   2   . omeprazole (PRILOSEC) 20 MG capsule   Oral   Take 40 mg by mouth 2 (two) times daily.         Marland Kitchen SPIRIVA HANDIHALER 18 MCG inhalation capsule      PLACE ONE CAPSULE INTO DEVICE AND INHALE 1 PUFF DAILY   30 capsule   11   . loperamide (IMODIUM) 2 MG capsule   Oral   Take 1 capsule (2 mg total) by mouth as needed for diarrhea or loose stools (up to total 16mg  daily).   8 capsule   0   . warfarin (COUMADIN) 5 MG tablet   Oral   Take 5 mg by mouth daily.          BP 151/51  Pulse 64  Temp(Src) 99 F (37.2 C) (Rectal)  Resp 24  Ht 5' (1.524 m)  Wt 273 lb (123.832 kg)  BMI 53.32 kg/m2  SpO2 98%  LMP 05/22/1968 Physical Exam  Nursing note and vitals reviewed. Constitutional: She appears well-developed and well-nourished. She appears distressed.  HENT:  Head: Normocephalic and  atraumatic.  Mouth/Throat: Oropharynx is clear and moist. No oropharyngeal exudate.  Eyes: Conjunctivae and EOM are normal. Pupils are equal, round, and reactive to light. Right eye exhibits no discharge. Left eye exhibits no discharge. No scleral icterus.  Neck: Normal range of motion. Neck supple. No JVD present. No thyromegaly present.  Cardiovascular: Normal rate, regular rhythm and intact distal pulses.  Exam reveals no gallop and no friction rub.   Murmur ( Loud systolic murmur) heard. Pulmonary/Chest: She is in respiratory distress. She has no wheezes. She has rales ( Scattered rales at the bases).  The patient is tachypneic, speaks in short sentences, rales at the bases  Abdominal: Soft. Bowel sounds are normal. She exhibits no distension and no mass. There is no tenderness.  Musculoskeletal: Normal range of motion. She exhibits no edema and no tenderness.  Lymphadenopathy:    She has no cervical adenopathy.  Neurological: She is alert. Coordination normal.  Skin: Skin is warm and dry. No rash noted. No erythema.  Psychiatric: She has a normal mood and affect. Her behavior is normal.    ED Course  Procedures (including critical care time) Labs Reviewed  PRO B NATRIURETIC PEPTIDE - Abnormal; Notable for the following:    Pro B Natriuretic peptide (BNP) 1817.0 (*)    All other components within normal limits  CBC WITH DIFFERENTIAL - Abnormal; Notable for the following:    Hemoglobin 10.3 (*)    HCT 34.4 (*)    MCHC 29.9 (*)    RDW 16.2 (*)    All other components within normal limits  PROTIME-INR - Abnormal; Notable for the following:    Prothrombin Time 26.5 (*)    INR 2.54 (*)    All other components within normal limits  POCT I-STAT, CHEM 8 - Abnormal; Notable for the following:    BUN 37 (*)    Creatinine, Ser 1.30 (*)    Glucose, Bld 189 (*)    Hemoglobin 11.6 (*)    HCT 34.0 (*)    All other components within normal limits  URINALYSIS, ROUTINE W REFLEX MICROSCOPIC   POCT I-STAT TROPONIN I   Dg Chest Port 1 View  08/30/2012   *RADIOLOGY REPORT*  Clinical Data: Chest pain.  PORTABLE CHEST - 1 VIEW  Comparison: 08/17/2012  Findings: Shallow inspiration.  Cardiac enlargement with mild pulmonary vascular congestion.  Perihilar interstitial changes suggest mild interstitial edema.  No focal consolidation.  No blunting of costophrenic angles.  No pneumothorax.  Calcified and tortuous aorta.  No significant changes since the previous study.  IMPRESSION: Cardiac enlargement with mild pulmonary vascular congestion and mild interstitial edema.   Original Report Authenticated By: Burman Nieves, M.D.   1. CHF (congestive heart failure), acute on chronic, diastolic     MDM  The patient has an exam consistent with congestive heart failure exacerbation and aortic stenosis. The care for this patient will be somewhat complicated but at this time will be Lasix for diuresis, chest x-ray, labs, EKG and likely admission to the hospital given her level of distress.  Filed Vitals:   08/30/12 0300  BP:   Pulse: 64  Temp: 99 F (37.2 C)  Resp: 24   ED ECG REPORT  I personally interpreted this EKG   Date: 08/30/2012   Rate: 66  Rhythm:  atrial fibrillation  QRS Axis: left  Intervals: normal  ST/T Wave abnormalities: nonspecific T wave changes  Conduction Disutrbances:left bundle branch block  Narrative Interpretation:   Old EKG Reviewed: Compared with 08/13/2012, rate is slightly increased but no other significant changes are seen  Labs show elevated BNP consistent with prior values and her chest x-ray shows pulmonary edema. I discussed her care with the internal medicine resident, I have ordered Lasix, the patient will be admitted to the hospital.   Vida Roller, MD 08/30/12 [phone]

## 2012-08-31 ENCOUNTER — Encounter (HOSPITAL_COMMUNITY): Payer: Self-pay | Admitting: Internal Medicine

## 2012-08-31 DIAGNOSIS — E119 Type 2 diabetes mellitus without complications: Secondary | ICD-10-CM

## 2012-08-31 DIAGNOSIS — I4891 Unspecified atrial fibrillation: Secondary | ICD-10-CM

## 2012-08-31 DIAGNOSIS — I359 Nonrheumatic aortic valve disorder, unspecified: Secondary | ICD-10-CM

## 2012-08-31 DIAGNOSIS — I2510 Atherosclerotic heart disease of native coronary artery without angina pectoris: Secondary | ICD-10-CM

## 2012-08-31 DIAGNOSIS — I129 Hypertensive chronic kidney disease with stage 1 through stage 4 chronic kidney disease, or unspecified chronic kidney disease: Secondary | ICD-10-CM

## 2012-08-31 DIAGNOSIS — N390 Urinary tract infection, site not specified: Secondary | ICD-10-CM

## 2012-08-31 LAB — GLUCOSE, CAPILLARY
Glucose-Capillary: 143 mg/dL — ABNORMAL HIGH (ref 70–99)
Glucose-Capillary: 150 mg/dL — ABNORMAL HIGH (ref 70–99)

## 2012-08-31 LAB — BASIC METABOLIC PANEL
Calcium: 9.4 mg/dL (ref 8.4–10.5)
Creatinine, Ser: 1.25 mg/dL — ABNORMAL HIGH (ref 0.50–1.10)
GFR calc Af Amer: 46 mL/min — ABNORMAL LOW (ref >90)
GFR calc non Af Amer: 40 mL/min — ABNORMAL LOW (ref >90)
Sodium: 137 mEq/L (ref 135–145)

## 2012-08-31 LAB — PROTIME-INR
INR: 2.29 — ABNORMAL HIGH (ref 0.00–1.49)
Prothrombin Time: 24.5 seconds — ABNORMAL HIGH (ref 11.6–15.2)

## 2012-08-31 MED ORDER — FUROSEMIDE 80 MG PO TABS
80.0000 mg | ORAL_TABLET | Freq: Every day | ORAL | Status: DC
  Administered 2012-09-01: 80 mg via ORAL
  Filled 2012-08-31 (×2): qty 1

## 2012-08-31 MED ORDER — FUROSEMIDE 40 MG PO TABS
40.0000 mg | ORAL_TABLET | Freq: Every day | ORAL | Status: DC
  Administered 2012-08-31: 40 mg via ORAL
  Filled 2012-08-31 (×2): qty 1

## 2012-08-31 NOTE — Progress Notes (Signed)
ANTICOAGULATION CONSULT NOTE - Follow Up Consult  Pharmacy Consult for : Coumadin Indication:  atrial fibrillation  Weight: 122.8 kg  Labs:  Recent Labs  08/30/12 0230 08/30/12 0239 08/30/12 0743 08/31/12 0556  HGB 10.3* 11.6*  --   --   HCT 34.4* 34.0*  --   --   PLT 277  --   --   --   LABPROT 26.5*  --   --  24.5*  INR 2.54*  --   --  2.29*  CREATININE  --  1.30* 1.17* 1.25*   Lab Results  Component Value Date   INR 2.29* 08/31/2012   INR 2.54* 08/30/2012   INR 4.7 08/20/2012   Medications:  Prescriptions prior to admission  Medication Sig Dispense Refill  . acetaminophen (TYLENOL) 500 MG tablet Take 1,000 mg by mouth 2 (two) times daily as needed. For pain.      Marland Kitchen albuterol (PROVENTIL HFA) 108 (90 BASE) MCG/ACT inhaler Inhale 2 puffs into the lungs every 6 (six) hours as needed. For wheezing.  3.7 g  11  . allopurinol (ZYLOPRIM) 100 MG tablet Take 1 tablet (100 mg total) by mouth 2 (two) times daily.  60 tablet  3  . amLODipine (NORVASC) 5 MG tablet Take 5 mg by mouth daily.      Marland Kitchen aspirin EC 81 MG EC tablet Take 1 tablet (81 mg total) by mouth daily.      Marland Kitchen atorvastatin (LIPITOR) 40 MG tablet Take 40 mg by mouth daily.      . colchicine 0.6 MG tablet Take 0.6 mg by mouth See admin instructions. Take for 3 days when gout flares up      . FLUoxetine (PROZAC) 20 MG capsule Take 20 mg by mouth 2 (two) times daily.      . Fluticasone-Salmeterol (ADVAIR) 250-50 MCG/DOSE AEPB Inhale 1 puff into the lungs every 12 (twelve) hours.      . furosemide (LASIX) 40 MG tablet Take 40-80 mg by mouth 2 (two) times daily. 80 mg in the morning and 40 mg at night      . gabapentin (NEURONTIN) 100 MG capsule Take 100 mg by mouth 2 (two) times daily.      Marland Kitchen glipiZIDE (GLUCOTROL) 10 MG tablet Take 5 mg by mouth 2 (two) times daily before a meal.      . levothyroxine (SYNTHROID) 150 MCG tablet Take 1 tablet (150 mcg total) by mouth daily.  30 tablet  11  . montelukast (SINGULAIR) 10 MG tablet Take  1 tablet (10 mg total) by mouth daily.  30 tablet  2  . omeprazole (PRILOSEC) 20 MG capsule Take 40 mg by mouth 2 (two) times daily.      Marland Kitchen SPIRIVA HANDIHALER 18 MCG inhalation capsule PLACE ONE CAPSULE INTO DEVICE AND INHALE 1 PUFF DAILY  30 capsule  11  . loperamide (IMODIUM) 2 MG capsule Take 1 capsule (2 mg total) by mouth as needed for diarrhea or loose stools (up to total 16mg  daily).  8 capsule  0  . warfarin (COUMADIN) 5 MG tablet Take 5 mg by mouth daily.       Scheduled:  . allopurinol  100 mg Oral BID  . amLODipine  5 mg Oral Daily  . aspirin EC  81 mg Oral Daily  . atorvastatin  40 mg Oral Daily  . cefTRIAXone (ROCEPHIN)  IV  1 g Intravenous Q24H  . FLUoxetine  20 mg Oral BID  . gabapentin  100 mg Oral  BID  . insulin aspart  0-5 Units Subcutaneous QHS  . insulin aspart  0-9 Units Subcutaneous TID WC  . levothyroxine  150 mcg Oral QAC breakfast  . mometasone-formoterol  2 puff Inhalation BID  . montelukast  10 mg Oral Daily  . pantoprazole  40 mg Oral Daily  . sodium chloride  3 mL Intravenous Q12H  . sodium chloride  3 mL Intravenous Q12H  . tiotropium  18 mcg Inhalation Daily  . warfarin  5 mg Oral q1800  . Warfarin - Pharmacist Dosing Inpatient   Does not apply q1800   Assessment:  79 y/o female admitted for treatment of UTI who is on chronic Coumadin for history of atrial fibrillation.  INR remains therapeutic, 2.29.  No bleeding complications noted   Goal of Therapy:  INR 2-3   Plan:  Continue Coumadin 5 mg daily.  If INR remains stable, with change INR's to MWF.  Monitor for bleeding complications    Rayford Halsted.D 08/31/2012, 11:40 AM

## 2012-08-31 NOTE — Progress Notes (Signed)
Subjective: Overnight patient reported constipation, was given miralax. She has had a BM but feels that she did not "get it all out."  Patient reports overall she is feeling better.  She does note some discomfort near her bladder but has a foley in.  Patient able to lie flat without SOB.  Patient remains afebrile. Objective: Vital signs in last 24 hours: Filed Vitals:   08/31/12 0628 08/31/12 0900 08/31/12 0957 08/31/12 1346  BP: 125/43  126/44 135/33  Pulse: 50  52 67  Temp: 98.6 F (37 C)   98.2 F (36.8 C)  TempSrc: Oral   Oral  Resp: 18   18  Height:      Weight: 270 lb 12.8 oz (122.834 kg)     SpO2: 99% 99%  99%   Weight change: -2 lb 3.2 oz (-0.998 kg)  Intake/Output Summary (Last 24 hours) at 08/31/12 1411 Last data filed at 08/31/12 1300  Gross per 24 hour  Intake    720 ml  Output   2750 ml  Net  -2030 ml   General: resting in bed  HEENT: EOMI, no scleral icterus  Cardiac: RRR, distant heart sounds no murmur appreciated  Pulm: Mildly decreased breath sounds b/l, crackles present over RLL lung fields  Abd: obese, soft, nontender, nondistended, BS present  Ext: warm and well perfused, no pedal edema  Neuro: alert and oriented X3  Lab Results: Basic Metabolic Panel:  Recent Labs Lab 08/30/12 0743 08/31/12 0556  NA 135 137  K 4.0 4.0  CL 97 98  CO2 30 33*  GLUCOSE 143* 189*  BUN 36* 41*  CREATININE 1.17* 1.25*  CALCIUM 9.5 9.4  MG 2.3  --    Liver Function Tests:  Recent Labs Lab 08/30/12 0743  AST 15  ALT 13  ALKPHOS 110  BILITOT 0.5  PROT 7.1  ALBUMIN 3.3*   CBC:  Recent Labs Lab 08/30/12 0230 08/30/12 0239  WBC 10.5  --   NEUTROABS 7.6  --   HGB 10.3* 11.6*  HCT 34.4* 34.0*  MCV 87.5  --   PLT 277  --    Cardiac Enzymes:  Recent Labs Lab 08/30/12 0743 08/30/12 1115 08/30/12 1942  TROPONINI <0.30 <0.30 <0.30   BNP:  Recent Labs Lab 08/30/12 0230  PROBNP 1817.0*   CBG:  Recent Labs Lab 08/30/12 0743  08/30/12 1152 08/30/12 1656 08/30/12 2122 08/31/12 0742 08/31/12 1103  GLUCAP 139* 154* 163* 121* 143* 195*   Coagulation:  Recent Labs Lab 08/30/12 0230 08/31/12 0556  LABPROT 26.5* 24.5*  INR 2.54* 2.29*   Urinalysis:  Recent Labs Lab 08/30/12 0302  COLORURINE YELLOW  LABSPEC 1.017  PHURINE 7.5  GLUCOSEU NEGATIVE  HGBUR SMALL*  BILIRUBINUR NEGATIVE  KETONESUR NEGATIVE  PROTEINUR 30*  UROBILINOGEN 0.2  NITRITE POSITIVE*  LEUKOCYTESUR LARGE*    Micro Results: Recent Results (from the past 240 hour(s))  URINE CULTURE     Status: None   Collection Time    08/30/12  3:02 AM      Result Value Range Status   Specimen Description URINE, CATHETERIZED   Final   Special Requests CX ADDED AT 0328 ON 161096   Final   Culture  Setup Time 08/30/2012 03:52   Final   Colony Count >=100,000 COLONIES/ML   Final   Culture PROTEUS MIRABILIS   Final   Report Status PENDING   Incomplete   Studies/Results: Dg Chest Port 1 View  08/30/2012   *RADIOLOGY REPORT*  Clinical  Data: Chest pain.  PORTABLE CHEST - 1 VIEW  Comparison: 08/17/2012  Findings: Shallow inspiration.  Cardiac enlargement with mild pulmonary vascular congestion.  Perihilar interstitial changes suggest mild interstitial edema.  No focal consolidation.  No blunting of costophrenic angles.  No pneumothorax.  Calcified and tortuous aorta.  No significant changes since the previous study.  IMPRESSION: Cardiac enlargement with mild pulmonary vascular congestion and mild interstitial edema.   Original Report Authenticated By: Burman Nieves, M.D.   Medications: I have reviewed the patient's current medications. Scheduled Meds: . allopurinol  100 mg Oral BID  . amLODipine  5 mg Oral Daily  . aspirin EC  81 mg Oral Daily  . atorvastatin  40 mg Oral Daily  . cefTRIAXone (ROCEPHIN)  IV  1 g Intravenous Q24H  . FLUoxetine  20 mg Oral BID  . gabapentin  100 mg Oral BID  . insulin aspart  0-5 Units Subcutaneous QHS  . insulin  aspart  0-9 Units Subcutaneous TID WC  . levothyroxine  150 mcg Oral QAC breakfast  . mometasone-formoterol  2 puff Inhalation BID  . montelukast  10 mg Oral Daily  . pantoprazole  40 mg Oral Daily  . sodium chloride  3 mL Intravenous Q12H  . sodium chloride  3 mL Intravenous Q12H  . tiotropium  18 mcg Inhalation Daily  . warfarin  5 mg Oral q1800  . Warfarin - Pharmacist Dosing Inpatient   Does not apply q1800   Continuous Infusions: none PRN Meds:.sodium chloride, acetaminophen, albuterol, loperamide, sodium chloride Assessment/Plan: Acute on chronic systolic CHF (congestive heart failure) with moderate/severe AS  -Recent 2D echo showed EF of 55% to 60% and moderate to severe stenosis. Valve area is 0.55cm^2(VTI) and 0.54cm^2 (Vmax). She was evaluated by cardiologist in her previous admission. Cardiology [facility]) felt AS was not critical and suggested following longitudinally and consider TAVR  -Patient has increase in Cr from baseline, may be due to UTI, Cr went down to 1.17 but bumped today to 1.25. -D/C lasix 80mg  IV BID, restart home Lasix dose. -Will Consult Cardiology to evaluate role of AS in current hospitalization and recommendations for possible TAVR. UTI  -Continue IV rocephin, awaiting cultures.  -Cr elevated to 1.3 from 1.06 baseline, trending down today to 1.17.  DIABETES MELLITUS  -Blood glucose 189 on admission, 189 this AM.  -Continue to hold home meds and continue SSI.  HYPERTENSION  -Continue amlodipine 5 and lasix  Atrial fibrillation  -Coumadin per pharm  COPD  -O2 supplement to maintain saturation >92%  -Albuterol prn  -Advair  -Nurse to ambulate with pulse ox readings. CKD (chronic kidney disease) stage 3, GFR 30-59 ml/min  -Patient Cr 1.3 on admission, likely increased due to UTI. -Cr increased from 1.17 yesterday to 1.25 today.  Lasix IV D/C restarted home lasix.  DVT PPx: A/C (coumadin)  Dispo: Disposition is deferred at this time, awaiting  improvement of current medical problems. Anticipated discharge in approximately 1-2 day(s).  The patient does have a current PCP (Aletta Edouard, MD) and does need an [facility] hospital follow-up appointment after discharge.  The patient does not know have transportation limitations that hinder transportation to clinic appointments.   .Services Needed at time of discharge: Y = Yes, Blank = No PT:   OT:   RN:   Equipment:   Other:     LOS: 1 day   Carlynn Purl, DO 08/31/2012, 2:11 PM

## 2012-08-31 NOTE — Progress Notes (Signed)
UR Completed Samvel Zinn Graves-Bigelow, RN,BSN [phone]  

## 2012-08-31 NOTE — Progress Notes (Signed)
  Date: 08/31/2012  Patient name: [name]  Medical record number: [id number]  Date of birth: 1932-12-02   This patient has been seen and the plan of care was discussed with the house staff. Please see their note for complete details. I concur with their findings with the following additions/corrections:  States she feels better today. She was able to walk to restroom and have a BM.  No urinary symptoms. Agree with changing her lasix to PO today, she appears euvolemic. Some volume contraction noted on BMP. Place back on home dose of Lasix. Remove foley. May give one more dose of Rocephin IV to complete treatment for UTI present on admission. Denies any dietary changes and has been adherent to medications. Consult cardiology to revisit TAVR due to mod-severe AS.   Jonah Blue, DO 08/31/2012, 2:39 PM

## 2012-08-31 NOTE — Consult Note (Signed)
Admit date: 08/30/2012 Referring Physician  Dr. Criselda Peaches Primary Physician  Dr. Dalphine Handing Primary Cardiologist  Dr. Armanda Magic Reason for Consultation  CHF  HPI: [name] is a 79 y.o. female with a pmhx of CHF, moderate to severe AS, Afib on warfarin, CKD, CVA, and PE who presented to the ED with a cc of SOB. Since last being d/c from [facility] on 08/15/12, the patient had been doing well. However, at 1 am the patient began to cough and feel increasing SOB. The patient states that the problem began about an hour after she laid down to go to bed for the night. The patient states that this episode felt similar to her previous experiences with having fluid "on her lungs". She denies chest pain, headache, and lightheadedness. She does not have a scale that she can weigh herself at home, but she thinks that her weight has not changed much since discharge. Of note, the patient reports that she has been compliant with her medications since discharge. On admission she was found to have a UTI.  A proBNP was elevated slightly above prior discharge BNP.  She was admitted and started on antibiotics.  She was given a dose of IV Lasic and breathing improved and Lasix was changed back to PO today.  Cardiology is now consulted for reevaluation of moderate to severe AS and possible need for TAVR.  She currently is lying flat in no acute distress.      PMH:   Past Medical History  Diagnosis Date  . Coronary atherosclerosis of native coronary artery     Mild at cateterization 2006  . Abdominal pain     Likely secondary to presbyesophagus and gastric dysmotility  . Chronic diastolic heart failure     June 2014 echo with EF 55-60% without wall motion abd but mod to severe AS  . Respiratory failure, chronic     Mixed etiology with bronchospastic component  . Urinary incontinence     Recurrent uti's/resistance to cipro, bactrim  . Gout   . Postablative hypothyroidism     H/o Graves disease s/p radioactive  iodine ablation with resultant postablative hypothyroidism  . Hyperlipidemia   . Depression   . Morbidly obese     s/p gastric plication surgery  . Gastroesophageal reflux disease   . Ventral hernia     Repair in April 2008, complicated by MRSA abdominal wall cellulitis  . DVT (deep venous thrombosis) 1998  . Pulmonary embolism 1998    Greenfield filter  . Diabetes mellitus type 2, controlled, with complications   . Essential hypertension, benign   . AF (atrial fibrillation)     on chronic warfarin  . Osteoarthritis   . Stroke 1997    Denies residual  . Anemia     Blood transfusion [V58.2]  . Supratherapeutic INR 05/15/2012    Possibly induced by steroid use.   . Chest pain at rest 08/07/2012     PSH:   Past Surgical History  Procedure Laterality Date  . Cholecystectomy    . Appendectomy    . Breast biopsy    . Gastric bypass  1970's  . Hernia repair  05/2006    ventral hernia  . Greenfield filter placement  1998    Allergies:  Lantus and Nitrofurantoin Prior to Admit Meds:   Prescriptions prior to admission  Medication Sig Dispense Refill  . acetaminophen (TYLENOL) 500 MG tablet Take 1,000 mg by mouth 2 (two) times daily as needed. For pain.      Marland Kitchen  albuterol (PROVENTIL HFA) 108 (90 BASE) MCG/ACT inhaler Inhale 2 puffs into the lungs every 6 (six) hours as needed. For wheezing.  3.7 g  11  . allopurinol (ZYLOPRIM) 100 MG tablet Take 1 tablet (100 mg total) by mouth 2 (two) times daily.  60 tablet  3  . amLODipine (NORVASC) 5 MG tablet Take 5 mg by mouth daily.      Marland Kitchen aspirin EC 81 MG EC tablet Take 1 tablet (81 mg total) by mouth daily.      Marland Kitchen atorvastatin (LIPITOR) 40 MG tablet Take 40 mg by mouth daily.      . colchicine 0.6 MG tablet Take 0.6 mg by mouth See admin instructions. Take for 3 days when gout flares up      . FLUoxetine (PROZAC) 20 MG capsule Take 20 mg by mouth 2 (two) times daily.      . Fluticasone-Salmeterol (ADVAIR) 250-50 MCG/DOSE AEPB Inhale 1 puff  into the lungs every 12 (twelve) hours.      . furosemide (LASIX) 40 MG tablet Take 40-80 mg by mouth 2 (two) times daily. 80 mg in the morning and 40 mg at night      . gabapentin (NEURONTIN) 100 MG capsule Take 100 mg by mouth 2 (two) times daily.      Marland Kitchen glipiZIDE (GLUCOTROL) 10 MG tablet Take 5 mg by mouth 2 (two) times daily before a meal.      . levothyroxine (SYNTHROID) 150 MCG tablet Take 1 tablet (150 mcg total) by mouth daily.  30 tablet  11  . montelukast (SINGULAIR) 10 MG tablet Take 1 tablet (10 mg total) by mouth daily.  30 tablet  2  . omeprazole (PRILOSEC) 20 MG capsule Take 40 mg by mouth 2 (two) times daily.      Marland Kitchen SPIRIVA HANDIHALER 18 MCG inhalation capsule PLACE ONE CAPSULE INTO DEVICE AND INHALE 1 PUFF DAILY  30 capsule  11  . loperamide (IMODIUM) 2 MG capsule Take 1 capsule (2 mg total) by mouth as needed for diarrhea or loose stools (up to total 16mg  daily).  8 capsule  0  . warfarin (COUMADIN) 5 MG tablet Take 5 mg by mouth daily.       Fam HX:    Family History  Problem Relation Age of Onset  . Colon cancer Neg Hx    Social HX:    History   Social History  . Marital Status: Widowed    Spouse Name: N/A    Number of Children: N/A  . Years of Education: N/A   Occupational History  . Not on file.   Social History Main Topics  . Smoking status: Former Smoker -- 1.00 packs/day for 1.5 years    Types: Cigarettes  . Smokeless tobacco: Never Used  . Alcohol Use: No  . Drug Use: No  . Sexually Active: No   Other Topics Concern  . Not on file   Social History Narrative   She lives w/her son. Her son offers help and she has an aid who occasionally cooks for her and takes care of some other tasks around the pt's house.   Has medicare. Has son in [place] who is POA   Ambivalent about DNR issues and does not want a STOP sign posted in home   Home Health Aid visits 6 days a week              ROS:  All 11 ROS were addressed and are negative except what  is stated in  the HPI  Physical Exam: Blood pressure 135/33, pulse 67, temperature 98.2 F (36.8 C), temperature source Oral, resp. rate 18, height 5' (1.524 m), weight 122.834 kg (270 lb 12.8 oz), last menstrual period 05/22/1968, SpO2 99.00%.    General: Well developed, well nourished, in no acute distress Head: Eyes PERRLA, No xanthomas.   Normal cephalic and atramatic  Lungs:   Clear bilaterally to auscultation and percussion. Heart:   HRRR S1 S2 Pulses are 2+ & equal.2/6 SM at RUSB-LLSB            No carotid bruit. No JVD.  No abdominal bruits. No femoral bruits. Abdomen: Bowel sounds are positive, abdomen soft and non-tender without masses Extremities:   No clubbing, cyanosis or edema.  DP +1 Neuro: Alert and oriented X 3. Psych:  Good affect, responds appropriately    Labs:   Lab Results  Component Value Date   WBC 10.5 08/30/2012   HGB 11.6* 08/30/2012   HCT 34.0* 08/30/2012   MCV 87.5 08/30/2012   PLT 277 08/30/2012    Recent Labs Lab 08/30/12 0743 08/31/12 0556  NA 135 137  K 4.0 4.0  CL 97 98  CO2 30 33*  BUN 36* 41*  CREATININE 1.17* 1.25*  CALCIUM 9.5 9.4  PROT 7.1  --   BILITOT 0.5  --   ALKPHOS 110  --   ALT 13  --   AST 15  --   GLUCOSE 143* 189*   No results found for this basename: PTT   Lab Results  Component Value Date   INR 2.29* 08/31/2012   INR 2.54* 08/30/2012   INR 4.7 08/20/2012   Lab Results  Component Value Date   CKTOTAL 66 05/08/2010   CKMB 2.9 05/08/2010   TROPONINI <0.30 08/30/2012     Lab Results  Component Value Date   CHOL 99 08/20/2012   CHOL 136 08/02/2011   CHOL  Value: 162        ATP III CLASSIFICATION:  <200     mg/dL   Desirable  166-063  mg/dL   Borderline High  >=016    mg/dL   High        0/11/9321   Lab Results  Component Value Date   HDL 45 08/20/2012   HDL 36* 08/02/2011   HDL 39* 05/08/2010   Lab Results  Component Value Date   LDLCALC 36 08/20/2012   LDLCALC 62 08/02/2011   LDLCALC  Value: 94        Total Cholesterol/HDL:CHD Risk  Coronary Heart Disease Risk Table                     Men   Women  1/2 Average Risk   3.4   3.3  Average Risk       5.0   4.4  2 X Average Risk   9.6   7.1  3 X Average Risk  23.4   11.0        Use the calculated Patient Ratio above and the CHD Risk Table to determine the patient's CHD Risk.        ATP III CLASSIFICATION (LDL):  <100     mg/dL   Optimal  557-322  mg/dL   Near or Above                    Optimal  130-159  mg/dL   Borderline  025-427  mg/dL   High  >  190     mg/dL   Very High 9/81/1914   Lab Results  Component Value Date   TRIG 92 08/20/2012   TRIG 192* 08/02/2011   TRIG 145 05/08/2010   Lab Results  Component Value Date   CHOLHDL 2.2 08/20/2012   CHOLHDL 3.8 08/02/2011   CHOLHDL 4.2 05/08/2010   No results found for this basename: LDLDIRECT      Radiology:  Dg Chest Port 1 View  08/30/2012   *RADIOLOGY REPORT*  Clinical Data: Chest pain.  PORTABLE CHEST - 1 VIEW  Comparison: 08/17/2012  Findings: Shallow inspiration.  Cardiac enlargement with mild pulmonary vascular congestion.  Perihilar interstitial changes suggest mild interstitial edema.  No focal consolidation.  No blunting of costophrenic angles.  No pneumothorax.  Calcified and tortuous aorta.  No significant changes since the previous study.  IMPRESSION: Cardiac enlargement with mild pulmonary vascular congestion and mild interstitial edema.   Original Report Authenticated By: Burman Nieves, M.D.    EKG:  NSR with nonspecific IVCD  ASSESSMENT:  1.  Acute on chronic diastolic CHF with mildly elevated BNP from baseline last month.  She is symptomatically improved after IV lasix and just changed back to PO Lasix 2.  Moderate to severe AS by recent echo - mean AV gradient is only and peak AV velocity 3cm/sec which is most consistent with moderate AS.   3.  Morbid obesity 4.  Nonobstructive ASCAD by cath 2006 5.  afib on chronic anticoagulation  PLAN:   1.  At this time I do not think her AS is severe enough by echo  to warrant TAVR consideration.  I suspect her main problem is dietary indiscretion with sodium.  Please get a nutrition consult for guidance in low sodium diet.  If she continues to have bouts of CHF may need to consider TEE to get a planimetered area of AV.  Quintella Reichert, MD  08/31/2012  3:26 PM

## 2012-08-31 NOTE — Care Management Note (Signed)
    Page 1 of 2   09/02/2012     10:33:33 AM   CARE MANAGEMENT NOTE 09/02/2012  Patient:  DANIQUA, CAMPOY   Account Number:  [id number]  Date Initiated:  08/31/2012  Documentation initiated by:  GRAVES-BIGELOW,Tarica Harl  Subjective/Objective Assessment:   Pt admitted for SOB.     Action/Plan:   Pt was a previous admit for CHF and was active with [facility] for [facility] and PT services. Please write resumption orders at d/c when pt is medically stable. CM will continue to f/u.   Anticipated DC Date:  09/03/2012   Anticipated DC Plan:  HOME W HOME HEALTH SERVICES      DC Planning Services  CM consult      [facility] Choice  Resumption Of Svcs/PTA Provider   Choice offered to / List presented to:  C-1 Patient        HH arranged  HH-1 RN  HH-10 DISEASE MANAGEMENT  HH-2 PT      HH agency  [facility]   Status of service:  Completed, signed off Medicare Important Message given?   (If response is "NO", the following Medicare IM given date fields will be blank) Date Medicare IM given:   Date Additional Medicare IM given:    Discharge Disposition:  HOME W HOME HEALTH SERVICES  Per UR Regulation:  Reviewed for med. necessity/level of care/duration of stay  If discussed at Long Length of Stay Meetings, dates discussed:    Comments:  09-02-12 [address] [place], [place] [phone] Per MD notes:  they do not think her AS is severe enough by echo to warrant TAVR consideration. Continue PO Lasix to 80mg  BID  and follow renal function closely. Pt for possible d/c today and will need resumption orders for services listed above. [facility] is aware of resumption services. No further needs for CM at this time.

## 2012-09-01 ENCOUNTER — Inpatient Hospital Stay (HOSPITAL_COMMUNITY): Payer: Medicare PPO

## 2012-09-01 LAB — URINE CULTURE: Colony Count: >=100000

## 2012-09-01 LAB — GLUCOSE, CAPILLARY
Glucose-Capillary: 144 mg/dL — ABNORMAL HIGH (ref 70–99)
Glucose-Capillary: 184 mg/dL — ABNORMAL HIGH (ref 70–99)
Glucose-Capillary: 138 mg/dL — ABNORMAL HIGH (ref 70–99)
Glucose-Capillary: 160 mg/dL — ABNORMAL HIGH (ref 70–99)

## 2012-09-01 MED ORDER — FUROSEMIDE 80 MG PO TABS
80.0000 mg | ORAL_TABLET | Freq: Two times a day (BID) | ORAL | Status: DC
  Administered 2012-09-01 – 2012-09-02 (×2): 80 mg via ORAL
  Filled 2012-09-01 (×5): qty 1

## 2012-09-01 NOTE — Progress Notes (Signed)
Subjective: Patient ambulated this AM by nurse, was noted to desat to 79-83% despite being on 4L by [place].  At rest patient runs 99-100% oxygen sat while on 4L [place].  Patient reports she is feeling better and denies any urinary complaints.  She denies SOB, CP, F/C.  She reports eating well, and no trouble urinating.  Objective: Vital signs in last 24 hours: Filed Vitals:   09/01/12 0841 09/01/12 1008 09/01/12 1426 09/01/12 1500  BP: 115/80 114/72 161/106 122/64  Pulse:  56 52   Temp:   98.1 F (36.7 C)   TempSrc:   Oral   Resp:   22   Height:      Weight:      SpO2:   99%    Weight change: -14.4 oz (-0.408 kg)  Intake/Output Summary (Last 24 hours) at 09/01/12 1538 Last data filed at 08/31/12 2233  Gross per 24 hour  Intake    240 ml  Output    200 ml  Net     40 ml   General: resting in chair HEENT: EOMI, no scleral icterus  Cardiac: RRR, 2/6 systolic murmur Pulm: Mild decreased breath sounds over RLL lung field  Abd: obese, soft, nontender, nondistended, BS present  Ext: warm and well perfused, no pedal edema  Neuro: alert and oriented X3  Lab Results: Basic Metabolic Panel:  Recent Labs Lab 08/30/12 0743 08/31/12 0556  NA 135 137  K 4.0 4.0  CL 97 98  CO2 30 33*  GLUCOSE 143* 189*  BUN 36* 41*  CREATININE 1.17* 1.25*  CALCIUM 9.5 9.4  MG 2.3  --    Liver Function Tests:  Recent Labs Lab 08/30/12 0743  AST 15  ALT 13  ALKPHOS 110  BILITOT 0.5  PROT 7.1  ALBUMIN 3.3*   CBC:  Recent Labs Lab 08/30/12 0230 08/30/12 0239  WBC 10.5  --   NEUTROABS 7.6  --   HGB 10.3* 11.6*  HCT 34.4* 34.0*  MCV 87.5  --   PLT 277  --    Cardiac Enzymes:  Recent Labs Lab 08/30/12 0743 08/30/12 1115 08/30/12 1942  TROPONINI <0.30 <0.30 <0.30   BNP:  Recent Labs Lab 08/30/12 0230  PROBNP 1817.0*   CBG:  Recent Labs Lab 08/31/12 0742 08/31/12 1103 08/31/12 1616 08/31/12 2120 09/01/12 0747 09/01/12 1150  GLUCAP 143* 195* 138* 150* 144* 184*     Coagulation:  Recent Labs Lab 08/30/12 0230 08/31/12 0556 09/01/12 0405  LABPROT 26.5* 24.5* 24.0*  INR 2.54* 2.29* 2.23*   Urinalysis:  Recent Labs Lab 08/30/12 0302  COLORURINE YELLOW  LABSPEC 1.017  PHURINE 7.5  GLUCOSEU NEGATIVE  HGBUR SMALL*  BILIRUBINUR NEGATIVE  KETONESUR NEGATIVE  PROTEINUR 30*  UROBILINOGEN 0.2  NITRITE POSITIVE*  LEUKOCYTESUR LARGE*    Micro Results: Recent Results (from the past 240 hour(s))  URINE CULTURE     Status: None   Collection Time    08/30/12  3:02 AM      Result Value Range Status   Specimen Description URINE, CATHETERIZED   Final   Special Requests CX ADDED AT 0328 ON [id number]   Final   Culture  Setup Time 08/30/2012 03:52   Final   Colony Count >=100,000 COLONIES/ML   Final   Culture PROTEUS MIRABILIS   Final   Report Status 09/01/2012 FINAL   Final   Organism ID, Bacteria PROTEUS MIRABILIS   Final   Studies/Results: No results found. Medications: I have reviewed the patient's  current medications. Scheduled Meds: . allopurinol  100 mg Oral BID  . amLODipine  5 mg Oral Daily  . aspirin EC  81 mg Oral Daily  . atorvastatin  40 mg Oral Daily  . cefTRIAXone (ROCEPHIN)  IV  1 g Intravenous Q24H  . FLUoxetine  20 mg Oral BID  . furosemide  80 mg Oral BID  . gabapentin  100 mg Oral BID  . insulin aspart  0-5 Units Subcutaneous QHS  . insulin aspart  0-9 Units Subcutaneous TID WC  . levothyroxine  150 mcg Oral QAC breakfast  . mometasone-formoterol  2 puff Inhalation BID  . montelukast  10 mg Oral Daily  . pantoprazole  40 mg Oral Daily  . sodium chloride  3 mL Intravenous Q12H  . sodium chloride  3 mL Intravenous Q12H  . tiotropium  18 mcg Inhalation Daily  . warfarin  5 mg Oral q1800  . Warfarin - Pharmacist Dosing Inpatient   Does not apply q1800   Continuous Infusions: none PRN Meds:.sodium chloride, acetaminophen, albuterol, loperamide, sodium chloride Assessment/Plan: Acute on chronic systolic CHF  (congestive heart failure) with moderate/severe AS  -Recent 2D echo showed EF of 55% to 60% and moderate to severe stenosis. Valve area is 0.55cm^2(VTI) and 0.54cm^2 (Vmax). She was evaluated by cardiologist in her previous admission. Cardiology [facility]) felt AS was not critical and suggested following longitudinally and consider TAVR  -Patient has increase in Cr from baseline, may be due to UTI, Cr went down to 1.17 but bumped yesterday to 1.25. Lasix was changed from 80mg  IV BID to her home dose of PO 80mg  in AM and 40mg  in PM.  Since patient desaturated to 79% with ambulation- will increase lasix to 80mg  PO BID. -Cardiology consulted- Mod severe AS, not canidate for TAVR at this time, per recs Nutrition consulted who educated patient on low sat diet. DIABETES MELLITUS  -Blood glucose 189 on admission. -Continue to hold home meds and continue SSI.  HYPERTENSION  -Continue amlodipine 5 and lasix  Atrial fibrillation  -Coumadin per pharm  COPD  -O2 supplement to maintain saturation >92%  -Albuterol prn  -Advair  -Nurse to ambulate with pulse ox readings.  CKD (chronic kidney disease) stage 3, GFR 30-59 ml/min  -Patient Cr 1.3 on admission, likely increased due to UTI.  -Cr increased from 1.17 7/6 to 1.25 on 7/7. Lasix IV D/C now on lasix 80mg PO BID. -will check Cr with AM labs.  UTI -resolved -Treated with IV Ceftriaxone for 3 days DVT PPx: A/C (coumadin)  Dispo: Disposition is deferred at this time, awaiting improvement of current medical problems. Anticipated discharge in approximately 1-2 day(s).  The patient does have a current PCP (Aletta Edouard, MD) and does need an [facility] hospital follow-up appointment after discharge.  The patient does not know have transportation limitations that hinder transportation to clinic appointments.   .Services Needed at time of discharge: Y = Yes, Blank = No PT:   OT:   RN:   Equipment:   Other:     LOS: 2 days   Carlynn Purl, DO 09/01/2012, 3:38  PM

## 2012-09-01 NOTE — Progress Notes (Addendum)
  Date: 09/01/2012  Patient name: [name]  Medical record number: [id number]  Date of birth: 1932/06/23   This patient has been seen and the plan of care was discussed with the house staff. Please see their note for complete details. I concur with their findings with the following additions/corrections:  Feels well today. She feels she is at her baseline. She was educated on salt use. Cardiology does not feel she needs consideration for TAVR. D/C home on Lasix 80 mg PO bid and follow renal function as outpatient. Medically stable for D/C.  ADDENDUM: Reviewed O2 sat with ambulation, documented to decrease to 79% on 4 L. Repeat CXR PA & LAT. Lasix to 80 mg PO bid. Hold D/C for today.  Jonah Blue, DO 09/01/2012, 1:27 PM

## 2012-09-01 NOTE — Progress Notes (Signed)
SATURATION QUALIFICATIONS: (This note is used to comply with regulatory documentation for home oxygen)  Patient Saturations on Room Air at Rest = Pt on 4 L O2 nasal cannula continuous prior to admission  Patient Saturations on Room Air while Ambulating = N/A  Patient Saturations on 4 Liters of oxygen while Ambulating = 79%-83%  Please briefly explain why patient needs home oxygen: Pt O2 dropped from 94% on 4L O2 at rest to 79%-83% on 4L O2 while ambulating. Pt ambulated from chair to door and back.  Pt dyspneic on exertion and post-ambulation.    Efraim Kaufmann

## 2012-09-01 NOTE — Plan of Care (Signed)
Problem: Food- and Nutrition-Related Knowledge Deficit (NB-1.1) Goal: Nutrition education Formal process to instruct or train a patient/client in a skill or to impart knowledge to help patients/clients voluntarily manage or modify food choices and eating behavior to maintain or improve health. Outcome: Completed/Met Date Met:  09/01/12 Nutrition Education Note  RD consulted for nutrition education regarding CHF. Dietary recall reveals:  Breakfast: instant oatmeal with milk Lunch: meals on wheels Dinner: meal provided/prepared by home aide Beverages: water Snacks: jello  RD provided "Low Sodium Nutrition Therapy" handout from the Academy of [employer] and [employer]etics. Reviewed patient's dietary recall. Provided examples on ways to decrease sodium intake in diet. Discouraged intake of processed foods and use of salt shaker. Encouraged fresh fruits and vegetables as well as whole grain sources of carbohydrates to maximize fiber intake.   RD discussed why it is important for patient to adhere to diet recommendations, and emphasized the role of fluids, foods to avoid, and importance of weighing self daily. Teach back method used. RD provided contact information for home aide to call if questions arise.  Expect fair compliance.  Body mass index is 52.71 kg/(m^2). Pt meets criteria for Obese Class III based on current BMI.  Current diet order is Low Sodium, patient is consuming approximately 100% of meals at this time. Labs and medications reviewed. No further nutrition interventions warranted at this time. RD contact information provided. If additional nutrition issues arise, please re-consult RD.   Jarold Motto MS, RD, LDN Pager: [phone] After-hours pager: [phone]

## 2012-09-01 NOTE — Progress Notes (Signed)
Pharmacy Consult-Anticoagulation  Pharmacy Consult:  79 y/o female admitted with Proteus UTI is currently on chronic Coumadin for atrial fibrillation    Current Labs: Hematology  Recent Labs  08/30/12 0230 08/30/12 0239 08/30/12 0743 08/30/12 1115 08/30/12 1942 08/31/12 0556 09/01/12 0405  HGB 10.3* 11.6*  --   --   --   --   --   HCT 34.4* 34.0*  --   --   --   --   --   PLT 277  --   --   --   --   --   --   LABPROT 26.5*  --   --   --   --  24.5* 24.0*  INR 2.54*  --   --   --   --  2.29* 2.23*  CREATININE  --  1.30* 1.17*  --   --  1.25*  --   TROPONINI  --   --  <0.30 <0.30 <0.30  --   --    Lab Results  Component Value Date   INR 2.23* 09/01/2012   INR 2.29* 08/31/2012   INR 2.54* 08/30/2012    Estimated Creatinine Clearance: 44.7 ml/min (by C-G formula based on Cr of 1.25).  Current Meds:  allopurinol 100 mg Oral BID  amLODipine 5 mg Oral Daily  aspirin EC 81 mg Oral Daily  atorvastatin 40 mg Oral Daily  cefTRIAXone (ROCEPHIN)  IV 1 g Intravenous Q24H  FLUoxetine 20 mg Oral BID  furosemide 80 mg Oral BID  gabapentin 100 mg Oral BID  insulin aspart 0-5 Units Subcutaneous QHS  insulin aspart 0-9 Units Subcutaneous TID WC  levothyroxine 150 mcg Oral QAC breakfast  mometasone-formoterol 2 puff Inhalation BID  montelukast 10 mg Oral Daily  pantoprazole 40 mg Oral Daily  sodium chloride 3 mL Intravenous Q12H  sodium chloride 3 mL Intravenous Q12H  tiotropium 18 mcg Inhalation Daily  warfarin 5 mg Oral q1800  Warfarin - Pharmacist Dosing Inpatient  Does not apply q1800   Assessment:  Today's INR is 2.23.   INR is stable and within the therapeutic range.  No bleeding complications observed.  Goal/Plan: 1. INR goal is 2-3. 2. Continue Coumadin 5 mg daily. 3. Will change INR's and CBC's to TTSat. 4. Monitor for bleeding complications   Laurena Bering, Pharm.D. 09/01/2012  10:29 AM

## 2012-09-01 NOTE — Progress Notes (Addendum)
SUBJECTIVE:  SOB much improved  OBJECTIVE:   Vitals:   Filed Vitals:   09/01/12 0538 09/01/12 0818 09/01/12 0841 09/01/12 1008  BP: 131/43  115/80 114/72  Pulse: 51   56  Temp: 97.7 F (36.5 C)     TempSrc: Oral     Resp: 18     Height:      Weight: 122.426 kg (269 lb 14.4 oz)     SpO2: 100% 100%     I&O's:   Intake/Output Summary (Last 24 hours) at 09/01/12 1013 Last data filed at 08/31/12 2233  Gross per 24 hour  Intake    600 ml  Output    550 ml  Net     50 ml   TELEMETRY: Reviewed telemetry pt in NSR:     PHYSICAL EXAM General: Well developed, well nourished, in no acute distress Head: Eyes PERRLA, No xanthomas.   Normal cephalic and atramatic  Lungs:   Clear bilaterally to auscultation and percussion. Heart:   HRRR S1 S2 Pulses are 2+ & equal.            No carotid bruit. No JVD.  No abdominal bruits. No femoral bruits. Abdomen: Bowel sounds are positive, abdomen soft and non-tender without masses Extremities:   No clubbing, cyanosis or edema.  DP +1 Neuro: Alert and oriented X 3. Psych:  Good affect, responds appropriately   LABS: Basic Metabolic Panel:  Recent Labs  95/62/13 0743 08/31/12 0556  NA 135 137  K 4.0 4.0  CL 97 98  CO2 30 33*  GLUCOSE 143* 189*  BUN 36* 41*  CREATININE 1.17* 1.25*  CALCIUM 9.5 9.4  MG 2.3  --    Liver Function Tests:  Recent Labs  08/30/12 0743  AST 15  ALT 13  ALKPHOS 110  BILITOT 0.5  PROT 7.1  ALBUMIN 3.3*   No results found for this basename: LIPASE, AMYLASE,  in the last 72 hours CBC:  Recent Labs  08/30/12 0230 08/30/12 0239  WBC 10.5  --   NEUTROABS 7.6  --   HGB 10.3* 11.6*  HCT 34.4* 34.0*  MCV 87.5  --   PLT 277  --    Cardiac Enzymes:  Recent Labs  08/30/12 0743 08/30/12 1115 08/30/12 1942  TROPONINI <0.30 <0.30 <0.30   BNP: No components found with this basename: POCBNP,  D-Dimer: No results found for this basename: DDIMER,  in the last 72 hours Hemoglobin A1C: No results  found for this basename: HGBA1C,  in the last 72 hours Fasting Lipid Panel: No results found for this basename: CHOL, HDL, LDLCALC, TRIG, CHOLHDL, LDLDIRECT,  in the last 72 hours Thyroid Function Tests: No results found for this basename: TSH, T4TOTAL, FREET3, T3FREE, THYROIDAB,  in the last 72 hours Anemia Panel: No results found for this basename: VITAMINB12, FOLATE, FERRITIN, TIBC, IRON, RETICCTPCT,  in the last 72 hours Coag Panel:   Lab Results  Component Value Date   INR 2.23* 09/01/2012   INR 2.29* 08/31/2012   INR 2.54* 08/30/2012    RADIOLOGY: Dg Chest Port 1 View  08/30/2012   *RADIOLOGY REPORT*  Clinical Data: Chest pain.  PORTABLE CHEST - 1 VIEW  Comparison: 08/17/2012  Findings: Shallow inspiration.  Cardiac enlargement with mild pulmonary vascular congestion.  Perihilar interstitial changes suggest mild interstitial edema.  No focal consolidation.  No blunting of costophrenic angles.  No pneumothorax.  Calcified and tortuous aorta.  No significant changes since the previous study.  IMPRESSION: Cardiac  enlargement with mild pulmonary vascular congestion and mild interstitial edema.   Original Report Authenticated By: Burman Nieves, M.D.   Dg Chest Portable 1 View  08/11/2012   *RADIOLOGY REPORT*  Clinical Data: Shortness of breath.  PORTABLE CHEST - 1 VIEW  Comparison: Chest x-ray 08/06/2012.  Findings: Lung volumes are low.  Elevation of the right hemidiaphragm (unchanged). There is cephalization of the pulmonary vasculature, indistinctness of the interstitial markings, and patchy airspace disease throughout the lungs bilaterally suggestive of moderate pulmonary edema.  No definite pleural effusions.  Mild cardiomegaly is unchanged.  Upper mediastinal contours are within normal limits.  Atherosclerosis in the thoracic aorta.  IMPRESSION: 1.  The appearance of the chest, as above is suggestive of congestive heart failure. 2.  Atherosclerosis.   Original Report Authenticated By: Trudie Reed, M.D.   Dg Chest Port 1 View  08/06/2012   *RADIOLOGY REPORT*  Clinical Data: Chest pain and shortness of breath tonight.  PORTABLE CHEST - 1 VIEW  Comparison: 05/24/2012  Findings: Shallow inspiration.  Cardiac enlargement with pulmonary vascular congestion and perihilar edema.  Findings demonstrate some progression since previous study.  No blunting of costophrenic angles.  No pneumothorax.  Mediastinal contours appear intact. Calcified and tortuous aorta.  Degenerative changes in the spine and shoulders.  IMPRESSION: Cardiac enlargement with pulmonary vascular congestion and perihilar edema, demonstrating progression since previous study.   Original Report Authenticated By: Burman Nieves, M.D.   ASSESSMENT:  1. Acute on chronic diastolic CHF with mildly elevated BNP from baseline last month. She is symptomatically improved after IV lasix and just changed back to PO Lasix  2. Moderate to severe AS by recent echo - mean AV gradient is only and peak AV velocity 3cm/sec which is most consistent with moderate AS.  3. Morbid obesity  4. Nonobstructive ASCAD by cath 2006  5. afib on chronic anticoagulation  PLAN:  1. At this time I do not think her AS is severe enough by echo to warrant TAVR consideration. I suspect her main problem is dietary indiscretion with sodium. Please get a nutrition consult for guidance in low sodium diet. If she continues to have bouts of CHF may need to consider TEE to get a planimetered area of AV.  2.  Would increase PO Lasix to 80mg  BID for home dose and follow renal function closely     Quintella Reichert, MD  09/01/2012  10:13 AM

## 2012-09-02 ENCOUNTER — Inpatient Hospital Stay (HOSPITAL_COMMUNITY): Payer: Medicare PPO

## 2012-09-02 LAB — BASIC METABOLIC PANEL
BUN: 41 mg/dL — ABNORMAL HIGH (ref 6–23)
BUN: 40 mg/dL — ABNORMAL HIGH (ref 6–23)
CO2: 35 mEq/L — ABNORMAL HIGH (ref 19–32)
Chloride: 99 mEq/L (ref 96–112)
Chloride: 96 mEq/L (ref 96–112)
Creatinine, Ser: 1.2 mg/dL — ABNORMAL HIGH (ref 0.50–1.10)
Creatinine, Ser: 1.13 mg/dL — ABNORMAL HIGH (ref 0.50–1.10)
GFR calc Af Amer: 49 mL/min — ABNORMAL LOW (ref >90)
GFR calc non Af Amer: 42 mL/min — ABNORMAL LOW (ref >90)
Glucose, Bld: 178 mg/dL — ABNORMAL HIGH (ref 70–99)
Potassium: 3.9 mEq/L (ref 3.5–5.1)

## 2012-09-02 LAB — GLUCOSE, CAPILLARY
Glucose-Capillary: 202 mg/dL — ABNORMAL HIGH (ref 70–99)
Glucose-Capillary: 188 mg/dL — ABNORMAL HIGH (ref 70–99)

## 2012-09-02 LAB — CBC
HCT: 32.3 % — ABNORMAL LOW (ref 36.0–46.0)
MCHC: 29.1 g/dL — ABNORMAL LOW (ref 30.0–36.0)
MCV: 89 fL (ref 78.0–100.0)
Platelets: 243 10*3/uL (ref 150–400)
RDW: 16.1 % — ABNORMAL HIGH (ref 11.5–15.5)
WBC: 6.2 10*3/uL (ref 4.0–10.5)

## 2012-09-02 LAB — PROTIME-INR: INR: 2.45 — ABNORMAL HIGH (ref 0.00–1.49)

## 2012-09-02 MED ORDER — WARFARIN SODIUM 5 MG PO TABS
5.0000 mg | ORAL_TABLET | ORAL | Status: DC
  Administered 2012-09-02 – 2012-09-06 (×3): 5 mg via ORAL
  Filled 2012-09-02 (×3): qty 1

## 2012-09-02 MED ORDER — FUROSEMIDE 10 MG/ML IJ SOLN
80.0000 mg | Freq: Three times a day (TID) | INTRAMUSCULAR | Status: DC
  Administered 2012-09-02 – 2012-09-04 (×6): 80 mg via INTRAVENOUS
  Filled 2012-09-02 (×9): qty 8

## 2012-09-02 MED ORDER — WARFARIN SODIUM 2.5 MG PO TABS
2.5000 mg | ORAL_TABLET | ORAL | Status: DC
  Administered 2012-09-03 – 2012-09-05 (×2): 2.5 mg via ORAL
  Filled 2012-09-02 (×2): qty 1

## 2012-09-02 NOTE — Progress Notes (Signed)
SATURATION QUALIFICATIONS: (This note is used to comply with regulatory documentation for home oxygen)  Patient Saturations on 4L O2t = 99%  Patient Saturations on 4L O2 while Ambulating = 81%  Patient Saturations on 4 Liters of oxygen while at rest after ambulation = 92%  Please briefly explain why patient needs home oxygen:

## 2012-09-02 NOTE — Progress Notes (Signed)
  Date: 09/02/2012  Patient name: [name]  Medical record number: [id number]  Date of birth: 1932/12/29   This patient has been seen and the plan of care was discussed with the house staff. Please see their note for complete details. I concur with their findings with the following additions/corrections:  Yesterday noted to have O2 desat into upper 70% range on 4L. CXR shows pulmonary edema with some possibly RUL consolidation. At this time, she has no fever and no productive cough, so I doubt pneumonia. We will need to again diurese her more aggressively.  Change to Lasix 80 mg IV tid. Follow BMP, Mg q12hrs. Watch BP. Repeat CXR PA and LAT to compare to one in the morning. Strict I&O's. Ambulate and record O2 sat. Jonah Blue, DO 09/02/2012, 1:30 PM

## 2012-09-02 NOTE — Progress Notes (Signed)
SUBJECTIVE: feeling better.  Still requires 4L O2 to maintain adequate O2 sats  OBJECTIVE:   Vitals:   Filed Vitals:   09/01/12 1426 09/01/12 1500 09/01/12 2041 09/02/12 0610  BP: 161/106 122/64 136/56 132/56  Pulse: 52  52 51  Temp: 98.1 F (36.7 C)  98.2 F (36.8 C) 97.9 F (36.6 C)  TempSrc: Oral  Oral Oral  Resp: 22  20 20   Height:      Weight:    124.059 kg (273 lb 8 oz)  SpO2: 99%  97% 100%   I&O's:  No intake or output data in the 24 hours ending 09/02/12 0831 TELEMETRY: Reviewed telemetry pt in atrial fibrillation     PHYSICAL EXAM General: Well developed, well nourished, in no acute distress Head: Eyes PERRLA, No xanthomas.   Normal cephalic and atramatic  Lungs:   Clear bilaterally to auscultation and percussion. Heart:  Irregularly irregular S1 S2 Pulses are 2+ & equal. Abdomen: Bowel sounds are positive, abdomen soft and non-tender without masses  Extremities:   No clubbing, cyanosis or edema.  DP +1 Neuro: Alert and oriented X 3. Psych:  Good affect, responds appropriately   LABS: Basic Metabolic Panel:  Recent Labs  16/10/96 0556 09/02/12 0502  NA 137 140  K 4.0 4.2  CL 98 99  CO2 33* 36*  GLUCOSE 189* 182*  BUN 41* 41*  CREATININE 1.25* 1.20*  CALCIUM 9.4 9.4   CBC:  Recent Labs  09/02/12 0502  WBC 6.2  HGB 9.4*  HCT 32.3*  MCV 89.0  PLT 243   Cardiac Enzymes:  Recent Labs  08/30/12 1115 08/30/12 1942  TROPONINI <0.30 <0.30   Anemia Panel: No results found for this basename: VITAMINB12, FOLATE, FERRITIN, TIBC, IRON, RETICCTPCT,  in the last 72 hours Coag Panel:   Lab Results  Component Value Date   INR 2.45* 09/02/2012   INR 2.23* 09/01/2012   INR 2.29* 08/31/2012    RADIOLOGY: Dg Chest 2 View  09/01/2012   *RADIOLOGY REPORT*  Clinical Data: Hypoxia, shortness of breath  CHEST - 2 VIEW  Comparison: 08/30/2012  Findings: Cardiomegaly again noted.  Mild interstitial prominence bilaterally.  Hazy airspace disease in the right upper  lobe. Findings may be due to interstitial edema.  Superimposed pneumonia cannot be excluded.  Follow-up examination is recommended.  IMPRESSION: Mild interstitial prominence bilaterally.  Hazy airspace disease in the right upper lobe.  Findings may be due to interstitial edema. Superimposed pneumonia cannot be excluded.  Follow-up examination is recommended.   Original Report Authenticated By: Natasha Mead, M.D.   Dg Chest Port 1 View  08/30/2012   *RADIOLOGY REPORT*  Clinical Data: Chest pain.  PORTABLE CHEST - 1 VIEW  Comparison: 08/17/2012  Findings: Shallow inspiration.  Cardiac enlargement with mild pulmonary vascular congestion.  Perihilar interstitial changes suggest mild interstitial edema.  No focal consolidation.  No blunting of costophrenic angles.  No pneumothorax.  Calcified and tortuous aorta.  No significant changes since the previous study.  IMPRESSION: Cardiac enlargement with mild pulmonary vascular congestion and mild interstitial edema.   Original Report Authenticated By: Burman Nieves, M.D.   Dg Chest Portable 1 View  08/11/2012   *RADIOLOGY REPORT*  Clinical Data: Shortness of breath.  PORTABLE CHEST - 1 VIEW  Comparison: Chest x-ray 08/06/2012.  Findings: Lung volumes are low.  Elevation of the right hemidiaphragm (unchanged). There is cephalization of the pulmonary vasculature, indistinctness of the interstitial markings, and patchy airspace disease throughout the lungs bilaterally suggestive  of moderate pulmonary edema.  No definite pleural effusions.  Mild cardiomegaly is unchanged.  Upper mediastinal contours are within normal limits.  Atherosclerosis in the thoracic aorta.  IMPRESSION: 1.  The appearance of the chest, as above is suggestive of congestive heart failure. 2.  Atherosclerosis.   Original Report Authenticated By: Trudie Reed, M.D.   Dg Chest Port 1 View  08/06/2012   *RADIOLOGY REPORT*  Clinical Data: Chest pain and shortness of breath tonight.  PORTABLE CHEST - 1  VIEW  Comparison: 05/24/2012  Findings: Shallow inspiration.  Cardiac enlargement with pulmonary vascular congestion and perihilar edema.  Findings demonstrate some progression since previous study.  No blunting of costophrenic angles.  No pneumothorax.  Mediastinal contours appear intact. Calcified and tortuous aorta.  Degenerative changes in the spine and shoulders.  IMPRESSION: Cardiac enlargement with pulmonary vascular congestion and perihilar edema, demonstrating progression since previous study.   Original Report Authenticated By: Burman Nieves, M.D.   ASSESSMENT:  1. Acute on chronic diastolic CHF with mildly elevated BNP from baseline last month. She is symptomatically improved after IV lasix and just changed back to PO Lasix  2. Moderate to severe AS by recent echo - mean AV gradient is only and peak AV velocity 3cm/sec which is most consistent with moderate AS.  3. Morbid obesity  4. Nonobstructive ASCAD by cath 2006  5. afib on chronic anticoagulation  PLAN:  1. At this time I do not think her AS is severe enough by echo to warrant TAVR consideration. I suspect her main problem is dietary indiscretion with sodium. Please get a nutrition consult for guidance in low sodium diet. If she continues to have bouts of CHF may need to consider TEE to get a planimetered area of AV.  2. Continue PO Lasix to 80mg  BID  and follow renal function closely 3.  Recheck BNP 4.  I suspect that she has a component of obesity hypoventilation syndrome that is contributing to her hypoxia.       Quintella Reichert, MD  09/02/2012  8:31 AM

## 2012-09-02 NOTE — Progress Notes (Signed)
Subjective: Patient reports she is feeling "fine."  She admits that she did not sleep well last night because she had to urinate more frequently, and this morning there was too much noise outside her door to sleep.  She denies any cough, fever, or chills.  Denies SOB other than the ambulation attempt with the nurse yesterday.  She denies any urinary pain, abdominal pain, or chest pain. Objective: Vital signs in last 24 hours: Filed Vitals:   09/01/12 1426 09/01/12 1500 09/01/12 2041 09/02/12 0610  BP: 161/106 122/64 136/56 132/56  Pulse: 52  52 51  Temp: 98.1 F (36.7 C)  98.2 F (36.8 C) 97.9 F (36.6 C)  TempSrc: Oral  Oral Oral  Resp: 22  20 20   Height:      Weight:    273 lb 8 oz (124.059 kg)  SpO2: 99%  97% 100%   Weight change: 3 lb 9.6 oz (1.633 kg) No intake or output data in the 24 hours ending 09/02/12 0821 General: sitting on bed eating breakfast HEENT: EOMI, no scleral icterus  Cardiac: RRR, 2/6 systolic murmur  Pulm: Mild decreased breath sounds over lower lung fields, no wheezing, crackles, or rales appreciated. Abd: obese, soft, nontender, nondistended, BS present  Ext: warm and well perfused, no pedal edema  Neuro: alert and oriented X3  Lab Results: Basic Metabolic Panel:  Recent Labs Lab 08/30/12 0743 08/31/12 0556 09/02/12 0502  NA 135 137 140  K 4.0 4.0 4.2  CL 97 98 99  CO2 30 33* 36*  GLUCOSE 143* 189* 182*  BUN 36* 41* 41*  CREATININE 1.17* 1.25* 1.20*  CALCIUM 9.5 9.4 9.4  MG 2.3  --   --    Liver Function Tests:  Recent Labs Lab 08/30/12 0743  AST 15  ALT 13  ALKPHOS 110  BILITOT 0.5  PROT 7.1  ALBUMIN 3.3*   CBC:  Recent Labs Lab 08/30/12 0230 08/30/12 0239 09/02/12 0502  WBC 10.5  --  6.2  NEUTROABS 7.6  --   --   HGB 10.3* 11.6* 9.4*  HCT 34.4* 34.0* 32.3*  MCV 87.5  --  89.0  PLT 277  --  243   Cardiac Enzymes:  Recent Labs Lab 08/30/12 0743 08/30/12 1115 08/30/12 1942  TROPONINI <0.30 <0.30 <0.30    BNP:  Recent Labs Lab 08/30/12 0230  PROBNP 1817.0*   CBG:  Recent Labs Lab 08/31/12 2120 09/01/12 0747 09/01/12 1150 09/01/12 1642 09/01/12 2039 09/02/12 0716  GLUCAP 150* 144* 184* 138* 160* 160*   Coagulation:  Recent Labs Lab 08/30/12 0230 08/31/12 0556 09/01/12 0405 09/02/12 0502  LABPROT 26.5* 24.5* 24.0* 25.8*  INR 2.54* 2.29* 2.23* 2.45*   Urinalysis:  Recent Labs Lab 08/30/12 0302  COLORURINE YELLOW  LABSPEC 1.017  PHURINE 7.5  GLUCOSEU NEGATIVE  HGBUR SMALL*  BILIRUBINUR NEGATIVE  KETONESUR NEGATIVE  PROTEINUR 30*  UROBILINOGEN 0.2  NITRITE POSITIVE*  LEUKOCYTESUR LARGE*    Micro Results: Recent Results (from the past 240 hour(s))  URINE CULTURE     Status: None   Collection Time    08/30/12  3:02 AM      Result Value Range Status   Specimen Description URINE, CATHETERIZED   Final   Special Requests CX ADDED AT 0328 ON [id number]   Final   Culture  Setup Time 08/30/2012 03:52   Final   Colony Count >=100,000 COLONIES/ML   Final   Culture PROTEUS MIRABILIS   Final   Report Status 09/01/2012 FINAL  Final   Organism ID, Bacteria PROTEUS MIRABILIS   Final   Studies/Results: Dg Chest 2 View  09/01/2012   *RADIOLOGY REPORT*  Clinical Data: Hypoxia, shortness of breath  CHEST - 2 VIEW  Comparison: 08/30/2012  Findings: Cardiomegaly again noted.  Mild interstitial prominence bilaterally.  Hazy airspace disease in the right upper lobe. Findings may be due to interstitial edema.  Superimposed pneumonia cannot be excluded.  Follow-up examination is recommended.  IMPRESSION: Mild interstitial prominence bilaterally.  Hazy airspace disease in the right upper lobe.  Findings may be due to interstitial edema. Superimposed pneumonia cannot be excluded.  Follow-up examination is recommended.   Original Report Authenticated By: Natasha Mead, M.D.   Medications: I have reviewed the patient's current medications. Scheduled Meds: . allopurinol  100 mg Oral BID   . amLODipine  5 mg Oral Daily  . aspirin EC  81 mg Oral Daily  . atorvastatin  40 mg Oral Daily  . FLUoxetine  20 mg Oral BID  . furosemide  80 mg Oral BID  . gabapentin  100 mg Oral BID  . insulin aspart  0-5 Units Subcutaneous QHS  . insulin aspart  0-9 Units Subcutaneous TID WC  . levothyroxine  150 mcg Oral QAC breakfast  . mometasone-formoterol  2 puff Inhalation BID  . montelukast  10 mg Oral Daily  . pantoprazole  40 mg Oral Daily  . sodium chloride  3 mL Intravenous Q12H  . sodium chloride  3 mL Intravenous Q12H  . tiotropium  18 mcg Inhalation Daily  . warfarin  5 mg Oral q1800  . Warfarin - Pharmacist Dosing Inpatient   Does not apply q1800   Continuous Infusions: none PRN Meds:.sodium chloride, acetaminophen, albuterol, loperamide, sodium chloride Assessment/Plan: Acute on chronic systolic CHF (congestive heart failure) with moderate Aortic Stenosis -Cardiology consulted- Mod severe AS, not canidate for TAVR at this time, per recs Nutrition consulted who educated patient on low sat diet.  -Per Cardio if patient continues to have bouts of CHF may need to consider TEE to get a planimetered area of AV.  -Patient has hazy airspace in RUL on CXR, could be intersitial edema versus early broncopneumonia.  Patiet currently has no other signs of infection.  Will increase lasix to 80mg  IV TID, will check CXR today, follow weight, strict Is&Os, repeat CXR tomorrow to look for resolution of edema. DIABETES MELLITUS  -Stable -Continue to hold home meds and continue SSI.  HYPERTENSION  -Stable -Continue amlodipine 5 and lasix  Atrial fibrillation  -Coumadin per pharm  COPD  -O2 supplement to maintain saturation >92%  -Albuterol prn  -Advair  -Nurse to ambulate with pulse ox readings.  CKD (chronic kidney disease) stage 3, GFR 30-59 ml/min  -Patient Cr 1.3 on admission, likely increased due to UTI.  -Cr increased from 1.17 7/6 to 1.25 on 7/7 decreased to 1.2 on 7/8. -Will check  with AM labs tomorrow due to increased diuresis. UTI -resolved  -Treated with IV Ceftriaxone for 3 days  F/E/N -Will check K and Mg BID with increased lasix dose. DVT PPx: A/C (coumadin)  Dispo: Disposition is deferred at this time, awaiting improvement of current medical problems. Anticipated discharge in approximately 1-2 day(s).  The patient does have a current PCP (Aletta Edouard, MD) and does need an [facility] hospital follow-up appointment after discharge.  The patient does not know have transportation limitations that hinder transportation to clinic appointments.   .Services Needed at time of discharge: Y = Yes, Blank =  No PT: Y  OT:   RN: Y resume HHC with PT  Equipment:   Other:     LOS: 3 days   Carlynn Purl, DO 09/02/2012, 8:21 AM

## 2012-09-02 NOTE — Progress Notes (Signed)
Pharmacy Consult-Anticoagulation  Pharmacy Consult:  79 y/o female admitted with Proteus UTI is currently on chronic Coumadin for atrial fibrillation    Current Labs: Hematology  Recent Labs  08/30/12 1115 08/30/12 1942 08/31/12 0556 09/01/12 0405 09/02/12 0502  HGB  --   --   --   --  9.4*  HCT  --   --   --   --  32.3*  PLT  --   --   --   --  243  LABPROT  --   --  24.5* 24.0* 25.8*  INR  --   --  2.29* 2.23* 2.45*  CREATININE  --   --  1.25*  --  1.20*  TROPONINI <0.30 <0.30  --   --   --    Lab Results  Component Value Date   INR 2.45* 09/02/2012   INR 2.23* 09/01/2012   INR 2.29* 08/31/2012    Estimated Creatinine Clearance: 46.9 ml/min (by C-G formula based on Cr of 1.2).  Assessment:  Today's INR is 2.4.   INR is stable and within the therapeutic range.  No bleeding complications observed.  Completed abx for uti  Goal/Plan: 1. INR goal is 2-3. 2. Continue home dose of coumadin; Coumadin 5 mg Sun, Mon, Wed, Fri and 2.5mg  on Tue, Thur, Sat. 3. Will change INR's and CBC's to MWF. 4. Monitor for bleeding complications   Sheppard Coil PharmD., BCPS Clinical Pharmacist Pager [phone] 09/02/2012 10:26 AM

## 2012-09-03 ENCOUNTER — Other Ambulatory Visit: Payer: Self-pay

## 2012-09-03 ENCOUNTER — Inpatient Hospital Stay (HOSPITAL_COMMUNITY): Payer: Medicare PPO

## 2012-09-03 LAB — BASIC METABOLIC PANEL
BUN: 41 mg/dL — ABNORMAL HIGH (ref 6–23)
BUN: 40 mg/dL — ABNORMAL HIGH (ref 6–23)
CO2: 33 mEq/L — ABNORMAL HIGH (ref 19–32)
Chloride: 96 mEq/L (ref 96–112)
Chloride: 96 mEq/L (ref 96–112)
Creatinine, Ser: 1.1 mg/dL (ref 0.50–1.10)
GFR calc Af Amer: 56 mL/min — ABNORMAL LOW (ref >90)
GFR calc Af Amer: 54 mL/min — ABNORMAL LOW (ref >90)
GFR calc non Af Amer: 48 mL/min — ABNORMAL LOW (ref >90)
Glucose, Bld: 167 mg/dL — ABNORMAL HIGH (ref 70–99)
Potassium: 3.7 mEq/L (ref 3.5–5.1)
Potassium: 3.9 mEq/L (ref 3.5–5.1)
Sodium: 140 mEq/L (ref 135–145)

## 2012-09-03 LAB — MAGNESIUM
Magnesium: 2.1 mg/dL (ref 1.5–2.5)
Magnesium: 2 mg/dL (ref 1.5–2.5)

## 2012-09-03 LAB — GLUCOSE, CAPILLARY: Glucose-Capillary: 133 mg/dL — ABNORMAL HIGH (ref 70–99)

## 2012-09-03 LAB — MRSA PCR SCREENING: MRSA by PCR: NEGATIVE

## 2012-09-03 MED ORDER — POTASSIUM CHLORIDE CRYS ER 20 MEQ PO TBCR
40.0000 meq | EXTENDED_RELEASE_TABLET | Freq: Once | ORAL | Status: AC
  Administered 2012-09-03: 40 meq via ORAL
  Filled 2012-09-03: qty 2

## 2012-09-03 NOTE — Progress Notes (Signed)
Utilization review completed.  

## 2012-09-03 NOTE — Progress Notes (Addendum)
Subjective: Patient reports she is doing well.  Denies SOB but appears to be mildly SOB, talking in phrases (laying down flat).  Denies fever or chills, denies cough. Per nursing patient was walked today to a few feet outside her door, O2 sat decreased to 81%. Objective: Vital signs in last 24 hours: Filed Vitals:   09/03/12 0612 09/03/12 1044 09/03/12 1100 09/03/12 1400  BP:   132/55 150/56  Pulse:  55 60 48  Temp:   97 F (36.1 C) 97.4 F (36.3 C)  TempSrc:   Oral Oral  Resp:  20 18 18   Height:      Weight: 276 lb 11.2 oz (125.51 kg)     SpO2:  99% 98% 98%   Weight change: 3 lb 3.2 oz (1.452 kg)  Intake/Output Summary (Last 24 hours) at 09/03/12 1628 Last data filed at 09/03/12 1300  Gross per 24 hour  Intake    538 ml  Output   1951 ml  Net  -1413 ml   General: laying flat on bed, talking in phrases on 4LNC HEENT: EOMI, no scleral icterus  Cardiac: RRR, 2/6 systolic murmur  Pulm: Mild decreased breath sounds over lower lung fields, no wheezing, crackles, or rales appreciated.  Abd: obese, soft, nontender, nondistended, BS normoactive Ext: warm and well perfused, no pedal edema  Neuro: alert and oriented X3  Lab Results: Basic Metabolic Panel:  Recent Labs Lab 09/03/12 0550 09/03/12 1519 09/03/12 1520  NA 140 137  --   K 3.7 3.9  --   CL 96 96  --   CO2 33* 33*  --   GLUCOSE 152* 167*  --   BUN 41* 40*  --   CREATININE 1.07 1.10  --   CALCIUM 9.5 9.5  --   MG 2.1  --  2.0   Liver Function Tests:  Recent Labs Lab 08/30/12 0743  AST 15  ALT 13  ALKPHOS 110  BILITOT 0.5  PROT 7.1  ALBUMIN 3.3*   CBC:  Recent Labs Lab 08/30/12 0230 08/30/12 0239 09/02/12 0502  WBC 10.5  --  6.2  NEUTROABS 7.6  --   --   HGB 10.3* 11.6* 9.4*  HCT 34.4* 34.0* 32.3*  MCV 87.5  --  89.0  PLT 277  --  243   Cardiac Enzymes:  Recent Labs Lab 08/30/12 0743 08/30/12 1115 08/30/12 1942  TROPONINI <0.30 <0.30 <0.30   BNP:  Recent Labs Lab  08/30/12 0230 09/02/12 0502  PROBNP 1817.0* 1107.0*   CBG:  Recent Labs Lab 09/02/12 0716 09/02/12 1148 09/02/12 1635 09/02/12 2058 09/03/12 0747 09/03/12 1149  GLUCAP 160* 221* 202* 188* 133* 187*   Coagulation:  Recent Labs Lab 08/30/12 0230 08/31/12 0556 09/01/12 0405 09/02/12 0502  LABPROT 26.5* 24.5* 24.0* 25.8*  INR 2.54* 2.29* 2.23* 2.45*   Urinalysis:  Recent Labs Lab 08/30/12 0302  COLORURINE YELLOW  LABSPEC 1.017  PHURINE 7.5  GLUCOSEU NEGATIVE  HGBUR SMALL*  BILIRUBINUR NEGATIVE  KETONESUR NEGATIVE  PROTEINUR 30*  UROBILINOGEN 0.2  NITRITE POSITIVE*  LEUKOCYTESUR LARGE*    Micro Results: Recent Results (from the past 240 hour(s))  URINE CULTURE     Status: None   Collection Time    08/30/12  3:02 AM      Result Value Range Status   Specimen Description URINE, CATHETERIZED   Final   Special Requests CX ADDED AT 0328 ON 161096   Final   Culture  Setup Time 08/30/2012 03:52  Final   Colony Count >=100,000 COLONIES/ML   Final   Culture PROTEUS MIRABILIS   Final   Report Status 09/01/2012 FINAL   Final   Organism ID, Bacteria PROTEUS MIRABILIS   Final   Studies/Results: Dg Chest 2 View  09/03/2012   *RADIOLOGY REPORT*  Clinical Data: Shortness of breath, CHF  CHEST - 2 VIEW  Comparison: 09/02/2012  Findings: Low lung volumes with vascular crowding.  Mild right perihilar edema is possible, equivocal.  No pleural effusion or pneumothorax.  Cardiomegaly.  Mild degenerative changes of the visualized thoracolumbar spine.  IMPRESSION: Low lung volumes with vascular crowding.  Mild right perihilar edema is possible, equivocal.   Original Report Authenticated By: Charline Bills, M.D.   Dg Chest 2 View  09/02/2012   *RADIOLOGY REPORT*  Clinical Data: Shortness of breath and chest pain.  CHEST - 2 VIEW  Comparison: 09/01/2012.  Findings: View is extremely apical lordotic.  Trachea is midline. Heart size is grossly stable.  Visualized portions of the  lungs appear grossly clear.  No definite pleural fluid.  IMPRESSION: Suboptimal examination due to body habitus.  No definite acute findings.   Original Report Authenticated By: Leanna Battles, M.D.   Dg Chest 2 View  09/01/2012   *RADIOLOGY REPORT*  Clinical Data: Hypoxia, shortness of breath  CHEST - 2 VIEW  Comparison: 08/30/2012  Findings: Cardiomegaly again noted.  Mild interstitial prominence bilaterally.  Hazy airspace disease in the right upper lobe. Findings may be due to interstitial edema.  Superimposed pneumonia cannot be excluded.  Follow-up examination is recommended.  IMPRESSION: Mild interstitial prominence bilaterally.  Hazy airspace disease in the right upper lobe.  Findings may be due to interstitial edema. Superimposed pneumonia cannot be excluded.  Follow-up examination is recommended.   Original Report Authenticated By: Natasha Mead, M.D.   Medications: I have reviewed the patient's current medications. Scheduled Meds: . allopurinol  100 mg Oral BID  . aspirin EC  81 mg Oral Daily  . atorvastatin  40 mg Oral Daily  . FLUoxetine  20 mg Oral BID  . furosemide  80 mg Intravenous TID  . gabapentin  100 mg Oral BID  . insulin aspart  0-5 Units Subcutaneous QHS  . insulin aspart  0-9 Units Subcutaneous TID WC  . levothyroxine  150 mcg Oral QAC breakfast  . mometasone-formoterol  2 puff Inhalation BID  . montelukast  10 mg Oral Daily  . pantoprazole  40 mg Oral Daily  . sodium chloride  3 mL Intravenous Q12H  . sodium chloride  3 mL Intravenous Q12H  . tiotropium  18 mcg Inhalation Daily  . warfarin  2.5 mg Oral Custom  . warfarin  5 mg Oral Custom  . Warfarin - Pharmacist Dosing Inpatient   Does not apply q1800   Continuous Infusions: none PRN Meds:.sodium chloride, acetaminophen, albuterol, loperamide, sodium chloride Assessment/Plan: Acute on chronic systolic CHF (congestive heart failure) with moderate Aortic Stenosis  -Cardiology consulted- Mod severe AS, not canidate  for TAVR at this time. If patient continues to have bouts of CHF may need to consider TEE to get a planimetered area of AV.  -CXR today no longer shows RUL infiltrate but still has some vascular congestion.  Patient showns no other signs of infection (no cough, afebrile, no leukocytosis) making PNA less likely.  Creatinine continues to decrease, and HCO3 has not increased.  She appears to continue to be volume overloaded.  Weight is up 3 lbs since admission, I/O in past day  is -1L. -Continue Lasix 80mg  TID, follow BMet and Mg BID. DIABETES MELLITUS  -Stable  -Continue to hold home meds and continue SSI.  HYPERTENSION  -Patient with reading of 129/40, diastolic too low, will hold amlodipine -Monitor Atrial fibrillation  -Coumadin per pharm  COPD  -O2 supplement to maintain saturation >92%  -Albuterol prn  -Advair  -Nurse to ambulate with pulse ox readings.  CKD (chronic kidney disease) stage 3, GFR 30-59 ml/min  -Patient Cr 1.3 on admission, likely increased due to UTI.  -Cr trending down. -BMet Bid UTI -resolved  -Treated with IV Ceftriaxone for 3 days  F/E/N  -K+ 3.7 on AM labs will give PO. -Will check K and Mg BID with increased lasix dose.  DVT PPx: A/C (coumadin)  Dispo: Disposition is deferred at this time, awaiting improvement of current medical problems. Anticipated discharge in approximately 1-2 day(s).  The patient does have a current PCP (Aletta Edouard, MD) and does need an [facility] hospital follow-up appointment after discharge.  The patient does not know have transportation limitations that hinder transportation to clinic appointments.  .Services Needed at time of discharge: Y = Yes, Blank = No  PT:  Y   OT:    RN:  Y resume HHC with PT   Equipment:    Other:        LOS: 4 days   Carlynn Purl, DO 09/03/2012, 4:28 PM

## 2012-09-03 NOTE — Progress Notes (Signed)
SUBJECTIVE:  No complaints of SOB  OBJECTIVE:   Vitals:   Filed Vitals:   09/02/12 1440 09/02/12 2100 09/03/12 0611 09/03/12 0612  BP: 134/62 151/54 129/40   Pulse: 71 61 46   Temp: 98 F (36.7 C) 98 F (36.7 C) 96.4 F (35.8 C)   TempSrc: Oral Oral Oral   Resp: 20  22   Height:      Weight:    125.51 kg (276 lb 11.2 oz)  SpO2: 98% 93% 99%    I&O's:   Intake/Output Summary (Last 24 hours) at 09/03/12 0904 Last data filed at 09/03/12 0847  Gross per 24 hour  Intake    658 ml  Output   1951 ml  Net  -1293 ml   TELEMETRY: Reviewed telemetry pt in atrial fibrillation with CVR     PHYSICAL EXAM General: Well developed, well nourished, in no acute distress Head: Eyes PERRLA, No xanthomas.   Normal cephalic and atramatic  Lungs:   Clear bilaterally to auscultation and percussion. Heart:   irregularlly irregular S1 S2 Pulses are 2+ & equal. Abdomen: Bowel sounds are positive, abdomen soft and non-tender without masses  Extremities:   No clubbing, cyanosis or edema.  DP +1 Neuro: Alert and oriented X 3. Psych:  Good affect, responds appropriately   LABS: Basic Metabolic Panel:  Recent Labs  16/10/96 1721 09/03/12 0550  NA 138 140  K 3.9 3.7  CL 96 96  CO2 35* 33*  GLUCOSE 178* 152*  BUN 40* 41*  CREATININE 1.13* 1.07  CALCIUM 9.6 9.5  MG 1.9 2.1   Liver Function Tests: No results found for this basename: AST, ALT, ALKPHOS, BILITOT, PROT, ALBUMIN,  in the last 72 hours No results found for this basename: LIPASE, AMYLASE,  in the last 72 hours CBC:  Recent Labs  09/02/12 0502  WBC 6.2  HGB 9.4*  HCT 32.3*  MCV 89.0  PLT 243   Cardiac Enzymes: No results found for this basename: CKTOTAL, CKMB, CKMBINDEX, TROPONINI,  in the last 72 hours BNP: No components found with this basename: POCBNP,  D-Dimer: No results found for this basename: DDIMER,  in the last 72 hours Hemoglobin A1C: No results found for this basename: HGBA1C,  in the last 72  hours Fasting Lipid Panel: No results found for this basename: CHOL, HDL, LDLCALC, TRIG, CHOLHDL, LDLDIRECT,  in the last 72 hours Thyroid Function Tests: No results found for this basename: TSH, T4TOTAL, FREET3, T3FREE, THYROIDAB,  in the last 72 hours Anemia Panel: No results found for this basename: VITAMINB12, FOLATE, FERRITIN, TIBC, IRON, RETICCTPCT,  in the last 72 hours Coag Panel:   Lab Results  Component Value Date   INR 2.45* 09/02/2012   INR 2.23* 09/01/2012   INR 2.29* 08/31/2012    RADIOLOGY: Dg Chest 2 View  09/02/2012   *RADIOLOGY REPORT*  Clinical Data: Shortness of breath and chest pain.  CHEST - 2 VIEW  Comparison: 09/01/2012.  Findings: View is extremely apical lordotic.  Trachea is midline. Heart size is grossly stable.  Visualized portions of the lungs appear grossly clear.  No definite pleural fluid.  IMPRESSION: Suboptimal examination due to body habitus.  No definite acute findings.   Original Report Authenticated By: Leanna Battles, M.D.   Dg Chest 2 View  09/01/2012   *RADIOLOGY REPORT*  Clinical Data: Hypoxia, shortness of breath  CHEST - 2 VIEW  Comparison: 08/30/2012  Findings: Cardiomegaly again noted.  Mild interstitial prominence bilaterally.  Hazy airspace  disease in the right upper lobe. Findings may be due to interstitial edema.  Superimposed pneumonia cannot be excluded.  Follow-up examination is recommended.  IMPRESSION: Mild interstitial prominence bilaterally.  Hazy airspace disease in the right upper lobe.  Findings may be due to interstitial edema. Superimposed pneumonia cannot be excluded.  Follow-up examination is recommended.   Original Report Authenticated By: Natasha Mead, M.D.   Dg Chest Port 1 View  08/30/2012   *RADIOLOGY REPORT*  Clinical Data: Chest pain.  PORTABLE CHEST - 1 VIEW  Comparison: 08/17/2012  Findings: Shallow inspiration.  Cardiac enlargement with mild pulmonary vascular congestion.  Perihilar interstitial changes suggest mild interstitial  edema.  No focal consolidation.  No blunting of costophrenic angles.  No pneumothorax.  Calcified and tortuous aorta.  No significant changes since the previous study.  IMPRESSION: Cardiac enlargement with mild pulmonary vascular congestion and mild interstitial edema.   Original Report Authenticated By: Burman Nieves, M.D.   Dg Chest Portable 1 View  08/11/2012   *RADIOLOGY REPORT*  Clinical Data: Shortness of breath.  PORTABLE CHEST - 1 VIEW  Comparison: Chest x-ray 08/06/2012.  Findings: Lung volumes are low.  Elevation of the right hemidiaphragm (unchanged). There is cephalization of the pulmonary vasculature, indistinctness of the interstitial markings, and patchy airspace disease throughout the lungs bilaterally suggestive of moderate pulmonary edema.  No definite pleural effusions.  Mild cardiomegaly is unchanged.  Upper mediastinal contours are within normal limits.  Atherosclerosis in the thoracic aorta.  IMPRESSION: 1.  The appearance of the chest, as above is suggestive of congestive heart failure. 2.  Atherosclerosis.   Original Report Authenticated By: Trudie Reed, M.D.   Dg Chest Port 1 View  08/06/2012   *RADIOLOGY REPORT*  Clinical Data: Chest pain and shortness of breath tonight.  PORTABLE CHEST - 1 VIEW  Comparison: 05/24/2012  Findings: Shallow inspiration.  Cardiac enlargement with pulmonary vascular congestion and perihilar edema.  Findings demonstrate some progression since previous study.  No blunting of costophrenic angles.  No pneumothorax.  Mediastinal contours appear intact. Calcified and tortuous aorta.  Degenerative changes in the spine and shoulders.  IMPRESSION: Cardiac enlargement with pulmonary vascular congestion and perihilar edema, demonstrating progression since previous study.   Original Report Authenticated By: Burman Nieves, M.D.   ASSESSMENT:  1. Acute on chronic diastolic CHF with mildly elevated BNP from baseline last month. She is symptomatically improved  after IV lasix and just changed back to PO Lasix - given dose of IV Lasix yesterday to see if RUL opacity improves.  Her BNP is actually trending downward despite increase in opacity in RUL making CHF less likely. 2. Moderate to severe AS by recent echo - mean AV gradient is only and peak AV velocity 3cm/sec which is most consistent with moderate AS.  3. Morbid obesity  4. Nonobstructive ASCAD by cath 2006  5. afib on chronic anticoagulation  PLAN:  1. At this time I do not think her AS is severe enough by echo to warrant TAVR consideration. I suspect her main problem is dietary indiscretion with sodium. Please get a nutrition consult for guidance in low sodium diet. If she continues to have bouts of CHF may need to consider TEE to get a planimetered area of AV.  2. Continue PO Lasix to 80mg  BID and follow renal function closely  4. I suspect that she has a component of obesity hypoventilation syndrome that is contributing to her hypoxia.        Quintella Reichert, MD  09/03/2012  9:04 AM

## 2012-09-03 NOTE — Progress Notes (Signed)
  Date: 09/03/2012  Patient name: [name]  Medical record number: [id number]  Date of birth: 07-07-1932   This patient has been seen and the plan of care was discussed with the house staff. Please see their note for complete details. I concur with their findings with the following additions/corrections:  Feels slightly better today. Unfortunately, the PA/LAT xray obtained yesterday was of poor quality. CXR PA/LAT obtained today is better quality and no longer shows the concerning possible RUL infiltrate, but persistent congestion.  This makes PNA less likely (she is also not producing sputum, afebrile, and has no leukocytosis). Will continue to diurese at current rate, closely watching her BP. She is noted to have conversational dyspnea on today's exam.  She still is noted to have O2 desaturation on ambulation. DBP decreasing. Will stop amlodipine. Her Cr is actually decreasing, suspect some cardiorenal physiology. HCO3 is not uptrending, so she does not yet have a contraction alkalosis.  Weights are not accurate, per the numbers she is gaining weight on IV diuresis.  She is hemodynamically stable and denies any CP. She is still volume overloaded. Jonah Blue, DO 09/03/2012, 1:36 PM

## 2012-09-04 LAB — BASIC METABOLIC PANEL
CO2: 35 mEq/L — ABNORMAL HIGH (ref 19–32)
CO2: 31 mEq/L (ref 19–32)
Calcium: 9.9 mg/dL (ref 8.4–10.5)
Chloride: 98 mEq/L (ref 96–112)
Chloride: 91 mEq/L — ABNORMAL LOW (ref 96–112)
Creatinine, Ser: 1.03 mg/dL (ref 0.50–1.10)
Creatinine, Ser: 0.97 mg/dL (ref 0.50–1.10)
GFR calc Af Amer: 59 mL/min — ABNORMAL LOW (ref >90)
Glucose, Bld: 178 mg/dL — ABNORMAL HIGH (ref 70–99)
Potassium: 3.9 mEq/L (ref 3.5–5.1)
Sodium: 140 mEq/L (ref 135–145)

## 2012-09-04 LAB — GLUCOSE, CAPILLARY
Glucose-Capillary: 201 mg/dL — ABNORMAL HIGH (ref 70–99)
Glucose-Capillary: 150 mg/dL — ABNORMAL HIGH (ref 70–99)
Glucose-Capillary: 162 mg/dL — ABNORMAL HIGH (ref 70–99)
Glucose-Capillary: 182 mg/dL — ABNORMAL HIGH (ref 70–99)

## 2012-09-04 LAB — MAGNESIUM
Magnesium: 2.1 mg/dL (ref 1.5–2.5)
Magnesium: 2 mg/dL (ref 1.5–2.5)

## 2012-09-04 LAB — PROTIME-INR: INR: 2.18 — ABNORMAL HIGH (ref 0.00–1.49)

## 2012-09-04 MED ORDER — DIPHENHYDRAMINE HCL 25 MG PO CAPS
50.0000 mg | ORAL_CAPSULE | Freq: Four times a day (QID) | ORAL | Status: DC | PRN
  Administered 2012-09-04 – 2012-09-06 (×3): 50 mg via ORAL
  Filled 2012-09-04 (×3): qty 2

## 2012-09-04 MED ORDER — FUROSEMIDE 80 MG PO TABS
80.0000 mg | ORAL_TABLET | Freq: Two times a day (BID) | ORAL | Status: DC
  Administered 2012-09-04 – 2012-09-06 (×5): 80 mg via ORAL
  Filled 2012-09-04 (×6): qty 1

## 2012-09-04 MED ORDER — POLYETHYLENE GLYCOL 3350 17 G PO PACK
17.0000 g | PACK | Freq: Once | ORAL | Status: AC
  Administered 2012-09-04: 17 g via ORAL
  Filled 2012-09-04: qty 1

## 2012-09-04 NOTE — Progress Notes (Signed)
Pharmacy Consult-Anticoagulation  Pharmacy Consult:  79 y/o female admitted with Proteus UTI is currently on chronic Coumadin for atrial fibrillation    Current Labs: Hematology  Recent Labs  09/02/12 0502  09/03/12 0550 09/03/12 1519 09/04/12 0545  HGB 9.4*  --   --   --   --   HCT 32.3*  --   --   --   --   PLT 243  --   --   --   --   LABPROT 25.8*  --   --   --  23.6*  INR 2.45*  --   --   --  2.18*  CREATININE 1.20*  < > 1.07 1.10 1.03  < > = values in this interval not displayed. Lab Results  Component Value Date   INR 2.18* 09/04/2012   INR 2.45* 09/02/2012   INR 2.23* 09/01/2012    Estimated Creatinine Clearance: 54.1 ml/min (by C-G formula based on Cr of 1.03).  Assessment:  Today's INR is 2.18.   INR is stable and within the therapeutic range.  No bleeding complications observed.  Completed abx for uti  Goal/Plan: 1. INR goal is 2-3. 2. Continue home dose of coumadin; Coumadin 5 mg Sun, Mon, Wed, Fri and 2.5mg  on Tue, Thur, Sat. 3. INR's changed to MWF. 4. Monitor for bleeding complications   Sheppard Coil PharmD., BCPS Clinical Pharmacist Pager [phone] 09/04/2012 8:18 AM

## 2012-09-04 NOTE — Progress Notes (Signed)
  Date: 09/04/2012  Patient name: [name]  Medical record number: [id number]  Date of birth: August 26, 1932   This patient has been seen and the plan of care was discussed with the house staff. Please see their note for complete details. I concur with their findings with the following additions/corrections:  Overall, she states she feels better since admission. She has diuresed well.  -4 L since admission. Currently 269 lbs and appears euvolemic. I would check orthostatic vital signs. Her creatinine has actually improved and she has not developed a significant contraction alkalosis, which may mean she has more room to diurese. I would ambulate her at this time to determine oxygen requirement. I suspect she has LV  HFPEF due to AS and possibly some component of RHF due to pulmonary HTN. She would need PFT's as outpatient and ideally a sleep study. Ultimately, if she keeps having to be readmitted, would need a RHC.  Cardiology does not feel she needs TAVR at this time. Change Lasix back to 80 mg PO BID.  Follow K, Mg twice daily.  May be able to D/C this weekend if continues to do well.  Jonah Blue, DO 09/04/2012, 12:45 PM

## 2012-09-04 NOTE — Progress Notes (Addendum)
Subjective: Feeling better than on admission.  Reports her breathing is much improved but occasionally has SOB.  She admits slight clough but non productive.  She denies any fever or chills, denies any abdominal pain.  Objective: Vital signs in last 24 hours: Filed Vitals:   09/04/12 0129 09/04/12 0400 09/04/12 0411 09/04/12 0644  BP: 140/98 134/41    Pulse:  55    Temp:  98 F (36.7 C)    TempSrc:  Oral    Resp:  22    Height:      Weight:   273 lb 9.6 oz (124.104 kg) 269 lb 6.4 oz (122.199 kg)  SpO2:  99%     Weight change: -3 lb 1.6 oz (-1.406 kg)  Intake/Output Summary (Last 24 hours) at 09/04/12 0816 Last data filed at 09/03/12 2105  Gross per 24 hour  Intake    778 ml  Output   1351 ml  Net   -573 ml   General: sitting in chair, talking in full sentences on 4LNC  HEENT: EOMI, no scleral icterus  Cardiac: RRR, 2/6 systolic murmur  Pulm: Mild decreased breath sounds over lower lung fields, no wheezing, crackles, or rales appreciated. Poor respiratory effort Abd: obese, soft, nontender, nondistended, BS normoactive  Ext: warm and well perfused, no pedal edema  Neuro: alert and oriented X3  Lab Results: Basic Metabolic Panel:  Recent Labs Lab 09/03/12 1519 09/03/12 1520 09/04/12 0545  NA 137  --  140  K 3.9  --  3.9  CL 96  --  98  CO2 33*  --  35*  GLUCOSE 167*  --  154*  BUN 40*  --  38*  CREATININE 1.10  --  1.03  CALCIUM 9.5  --  9.4  MG  --  2.0 2.1   Liver Function Tests:  Recent Labs Lab 08/30/12 0743  AST 15  ALT 13  ALKPHOS 110  BILITOT 0.5  PROT 7.1  ALBUMIN 3.3*   CBC:  Recent Labs Lab 08/30/12 0230 08/30/12 0239 09/02/12 0502  WBC 10.5  --  6.2  NEUTROABS 7.6  --   --   HGB 10.3* 11.6* 9.4*  HCT 34.4* 34.0* 32.3*  MCV 87.5  --  89.0  PLT 277  --  243   Cardiac Enzymes:  Recent Labs Lab 08/30/12 0743 08/30/12 1115 08/30/12 1942  TROPONINI <0.30 <0.30 <0.30   BNP:  Recent Labs Lab 08/30/12 0230 09/02/12 0502    PROBNP 1817.0* 1107.0*   CBG:  Recent Labs Lab 09/02/12 2058 09/03/12 0747 09/03/12 1149 09/03/12 1643 09/03/12 2149 09/04/12 0731  GLUCAP 188* 133* 187* 124* 201* 150*   Coagulation:  Recent Labs Lab 08/31/12 0556 09/01/12 0405 09/02/12 0502 09/04/12 0545  LABPROT 24.5* 24.0* 25.8* 23.6*  INR 2.29* 2.23* 2.45* 2.18*   Urinalysis:  Recent Labs Lab 08/30/12 0302  COLORURINE YELLOW  LABSPEC 1.017  PHURINE 7.5  GLUCOSEU NEGATIVE  HGBUR SMALL*  BILIRUBINUR NEGATIVE  KETONESUR NEGATIVE  PROTEINUR 30*  UROBILINOGEN 0.2  NITRITE POSITIVE*  LEUKOCYTESUR LARGE*    Micro Results: Recent Results (from the past 240 hour(s))  URINE CULTURE     Status: None   Collection Time    08/30/12  3:02 AM      Result Value Range Status   Specimen Description URINE, CATHETERIZED   Final   Special Requests CX ADDED AT 0328 ON [id number]   Final   Culture  Setup Time 08/30/2012 03:52   Final  Colony Count >=100,000 COLONIES/ML   Final   Culture PROTEUS MIRABILIS   Final   Report Status 09/01/2012 FINAL   Final   Organism ID, Bacteria PROTEUS MIRABILIS   Final  MRSA PCR SCREENING     Status: None   Collection Time    09/03/12  9:17 PM      Result Value Range Status   MRSA by PCR NEGATIVE  NEGATIVE Final   Comment:            The GeneXpert MRSA Assay (FDA     approved for NASAL specimens     only), is one component of a     comprehensive MRSA colonization     surveillance program. It is not     intended to diagnose MRSA     infection nor to guide or     monitor treatment for     MRSA infections.   Studies/Results: Dg Chest 2 View  09/03/2012   *RADIOLOGY REPORT*  Clinical Data: Shortness of breath, CHF  CHEST - 2 VIEW  Comparison: 09/02/2012  Findings: Low lung volumes with vascular crowding.  Mild right perihilar edema is possible, equivocal.  No pleural effusion or pneumothorax.  Cardiomegaly.  Mild degenerative changes of the visualized thoracolumbar spine.  IMPRESSION:  Low lung volumes with vascular crowding.  Mild right perihilar edema is possible, equivocal.   Original Report Authenticated By: Charline Bills, M.D.   Dg Chest 2 View  09/02/2012   *RADIOLOGY REPORT*  Clinical Data: Shortness of breath and chest pain.  CHEST - 2 VIEW  Comparison: 09/01/2012.  Findings: View is extremely apical lordotic.  Trachea is midline. Heart size is grossly stable.  Visualized portions of the lungs appear grossly clear.  No definite pleural fluid.  IMPRESSION: Suboptimal examination due to body habitus.  No definite acute findings.   Original Report Authenticated By: Leanna Battles, M.D.   Medications: I have reviewed the patient's current medications. Scheduled Meds: . allopurinol  100 mg Oral BID  . aspirin EC  81 mg Oral Daily  . atorvastatin  40 mg Oral Daily  . FLUoxetine  20 mg Oral BID  . furosemide  80 mg Intravenous TID  . gabapentin  100 mg Oral BID  . insulin aspart  0-5 Units Subcutaneous QHS  . insulin aspart  0-9 Units Subcutaneous TID WC  . levothyroxine  150 mcg Oral QAC breakfast  . mometasone-formoterol  2 puff Inhalation BID  . montelukast  10 mg Oral Daily  . pantoprazole  40 mg Oral Daily  . sodium chloride  3 mL Intravenous Q12H  . sodium chloride  3 mL Intravenous Q12H  . tiotropium  18 mcg Inhalation Daily  . warfarin  2.5 mg Oral Custom  . warfarin  5 mg Oral Custom  . Warfarin - Pharmacist Dosing Inpatient   Does not apply q1800   Continuous Infusions: none PRN Meds:.sodium chloride, acetaminophen, albuterol, loperamide, sodium chloride Assessment/Plan: Acute on chronic systolic CHF (congestive heart failure) with moderate Aortic Stenosis  -Cardiology consulted- Mod severe AS, not canidate for TAVR at this time. If patient continues to have bouts of CHF may need to consider TEE to get a planimetered area of AV.  -Patient has known left sided heart failure but may also have component of right sided heart failure.  Lungs are CTA but  patient feels better after diuresis.  So far have removed 1.5L in 2 days, weight has fluctuated greatly Probably due to poor weight taking. -Change lasix  back to 80mg  PO BID. -Will continue to watch Bmet and Mg for contraction alkolosis and repleate electrolytes as necessary.  DIABETES MELLITUS  -Stable  -Continue to hold home meds and continue SSI.  HYPERTENSION  -Patient still with some low diastolic readings.  Will check orthostatics. -Monitor   Atrial fibrillation  -Coumadin per pharm  COPD  -O2 supplement to maintain saturation >92%  -Albuterol prn  -Advair  -Nurse to ambulate with pulse ox readings.  CKD (chronic kidney disease) stage 3, GFR 30-59 ml/min  -Patient Cr 1.3 on admission, likely increased due to UTI.  -Cr trending down.  -BMet Bid   UTI -resolved  -Treated with IV Ceftriaxone for 3 days  F/E/N  -K+ 3.7 on AM labs will give PO.  -Will check K and Mg BID with increased lasix dose.  DVT PPx: A/C (coumadin)  Dispo: Disposition is deferred at this time, awaiting improvement of current medical problems. Anticipated discharge in approximately 1-2 day(s).  The patient does have a current PCP (Aletta Edouard, MD) and does need an [facility] hospital follow-up appointment after discharge.  The patient does not know have transportation limitations that hinder transportation to clinic appointments.  .Services Needed at time of discharge: Y = Yes, Blank = No  PT:  Y   OT:    RN:  Y resume HHC with PT   Equipment:    Other:         LOS: 5 days   Carlynn Purl, DO 09/04/2012, 8:16 AM

## 2012-09-04 NOTE — Progress Notes (Signed)
SUBJECTIVE: SOB only with ambulation  OBJECTIVE:   Vitals:   Filed Vitals:   09/04/12 0411 09/04/12 0644 09/04/12 0800 09/04/12 0900  BP:   128/69   Pulse:   56   Temp:   98.2 F (36.8 C)   TempSrc:   Oral   Resp:   21   Height:      Weight: 124.104 kg (273 lb 9.6 oz) 122.199 kg (269 lb 6.4 oz)    SpO2:   99% 99%   I&O's:   Intake/Output Summary (Last 24 hours) at 09/04/12 1132 Last data filed at 09/03/12 2105  Gross per 24 hour  Intake    483 ml  Output   1000 ml  Net   -517 ml   TELEMETRY: Reviewed telemetry pt in atrial fibrillation     PHYSICAL EXAM General: Well developed, well nourished, in no acute distress Head: Eyes PERRLA, No xanthomas.   Normal cephalic and atramatic  Lungs:   Clear bilaterally to auscultation and percussion. Heart:   Irregularly irregular S1 S2 Pulses are 2+ & equal. Abdomen: Bowel sounds are positive, abdomen soft and non-tender without masses  Extremities:   No clubbing, cyanosis or edema.  DP +1 Neuro: Alert and oriented X 3. Psych:  Good affect, responds appropriately   LABS: Basic Metabolic Panel:  Recent Labs  30/86/57 1519 09/03/12 1520 09/04/12 0545  NA 137  --  140  K 3.9  --  3.9  CL 96  --  98  CO2 33*  --  35*  GLUCOSE 167*  --  154*  BUN 40*  --  38*  CREATININE 1.10  --  1.03  CALCIUM 9.5  --  9.4  MG  --  2.0 2.1   Liver Function Tests: No results found for this basename: AST, ALT, ALKPHOS, BILITOT, PROT, ALBUMIN,  in the last 72 hours No results found for this basename: LIPASE, AMYLASE,  in the last 72 hours CBC:  Recent Labs  09/02/12 0502  WBC 6.2  HGB 9.4*  HCT 32.3*  MCV 89.0  PLT 243    Coag Panel:   Lab Results  Component Value Date   INR 2.18* 09/04/2012   INR 2.45* 09/02/2012   INR 2.23* 09/01/2012    RADIOLOGY: Dg Chest 2 View  09/03/2012   *RADIOLOGY REPORT*  Clinical Data: Shortness of breath, CHF  CHEST - 2 VIEW  Comparison: 09/02/2012  Findings: Low lung volumes with vascular  crowding.  Mild right perihilar edema is possible, equivocal.  No pleural effusion or pneumothorax.  Cardiomegaly.  Mild degenerative changes of the visualized thoracolumbar spine.  IMPRESSION: Low lung volumes with vascular crowding.  Mild right perihilar edema is possible, equivocal.   Original Report Authenticated By: Charline Bills, M.D.   Dg Chest 2 View  09/02/2012   *RADIOLOGY REPORT*  Clinical Data: Shortness of breath and chest pain.  CHEST - 2 VIEW  Comparison: 09/01/2012.  Findings: View is extremely apical lordotic.  Trachea is midline. Heart size is grossly stable.  Visualized portions of the lungs appear grossly clear.  No definite pleural fluid.  IMPRESSION: Suboptimal examination due to body habitus.  No definite acute findings.   Original Report Authenticated By: Leanna Battles, M.D.   Dg Chest 2 View  09/01/2012   *RADIOLOGY REPORT*  Clinical Data: Hypoxia, shortness of breath  CHEST - 2 VIEW  Comparison: 08/30/2012  Findings: Cardiomegaly again noted.  Mild interstitial prominence bilaterally.  Hazy airspace disease in the right upper  lobe. Findings may be due to interstitial edema.  Superimposed pneumonia cannot be excluded.  Follow-up examination is recommended.  IMPRESSION: Mild interstitial prominence bilaterally.  Hazy airspace disease in the right upper lobe.  Findings may be due to interstitial edema. Superimposed pneumonia cannot be excluded.  Follow-up examination is recommended.   Original Report Authenticated By: Natasha Mead, M.D.   Dg Chest Port 1 View  08/30/2012   *RADIOLOGY REPORT*  Clinical Data: Chest pain.  PORTABLE CHEST - 1 VIEW  Comparison: 08/17/2012  Findings: Shallow inspiration.  Cardiac enlargement with mild pulmonary vascular congestion.  Perihilar interstitial changes suggest mild interstitial edema.  No focal consolidation.  No blunting of costophrenic angles.  No pneumothorax.  Calcified and tortuous aorta.  No significant changes since the previous study.   IMPRESSION: Cardiac enlargement with mild pulmonary vascular congestion and mild interstitial edema.   Original Report Authenticated By: Burman Nieves, M.D.   Dg Chest Portable 1 View  08/11/2012   *RADIOLOGY REPORT*  Clinical Data: Shortness of breath.  PORTABLE CHEST - 1 VIEW  Comparison: Chest x-ray 08/06/2012.  Findings: Lung volumes are low.  Elevation of the right hemidiaphragm (unchanged). There is cephalization of the pulmonary vasculature, indistinctness of the interstitial markings, and patchy airspace disease throughout the lungs bilaterally suggestive of moderate pulmonary edema.  No definite pleural effusions.  Mild cardiomegaly is unchanged.  Upper mediastinal contours are within normal limits.  Atherosclerosis in the thoracic aorta.  IMPRESSION: 1.  The appearance of the chest, as above is suggestive of congestive heart failure. 2.  Atherosclerosis.   Original Report Authenticated By: Trudie Reed, M.D.   Dg Chest Port 1 View  08/06/2012   *RADIOLOGY REPORT*  Clinical Data: Chest pain and shortness of breath tonight.  PORTABLE CHEST - 1 VIEW  Comparison: 05/24/2012  Findings: Shallow inspiration.  Cardiac enlargement with pulmonary vascular congestion and perihilar edema.  Findings demonstrate some progression since previous study.  No blunting of costophrenic angles.  No pneumothorax.  Mediastinal contours appear intact. Calcified and tortuous aorta.  Degenerative changes in the spine and shoulders.  IMPRESSION: Cardiac enlargement with pulmonary vascular congestion and perihilar edema, demonstrating progression since previous study.   Original Report Authenticated By: Burman Nieves, M.D.     ASSESSMENT/PLAN:  1. Acute on chronic diastolic CHF with mildly elevated BNP from baseline last month. She is symptomatically improved after IV lasix .  BNP is trending downward and she has put out over 4L net urine. Her lungs are clear and she has no LE edema.  I feel at this point she is  euvolemic and would change back to PO.   2. Moderate to severe AS by recent echo - mean AV gradient is only and peak AV velocity 3cm/sec which is most consistent with moderate AS.  3. Morbid obesity  4. Nonobstructive ASCAD by cath 2006  5. afib on chronic anticoagulation  6.  Hypoxia - I think this is mainly due to obesity hypoventilation sydrome.  She is morbidly obese and most likely has significant restrictive lung disease.  Low lung volumes noted on chest xray.    Quintella Reichert, MD  09/04/2012  11:32 AM

## 2012-09-05 LAB — BASIC METABOLIC PANEL
BUN: 39 mg/dL — ABNORMAL HIGH (ref 6–23)
CO2: 35 mEq/L — ABNORMAL HIGH (ref 19–32)
Calcium: 10 mg/dL (ref 8.4–10.5)
Chloride: 96 mEq/L (ref 96–112)
Creatinine, Ser: 1.01 mg/dL (ref 0.50–1.10)
Potassium: 4.5 mEq/L (ref 3.5–5.1)
Sodium: 138 mEq/L (ref 135–145)

## 2012-09-05 LAB — GLUCOSE, CAPILLARY: Glucose-Capillary: 287 mg/dL — ABNORMAL HIGH (ref 70–99)

## 2012-09-05 NOTE — Progress Notes (Signed)
  Echocardiogram 2D Echocardiogram has been performed.  [name] FRANCES 09/05/2012, 5:19 PM 

## 2012-09-05 NOTE — Progress Notes (Signed)
SUBJECTIVE:  Feeling better  OBJECTIVE:   Vitals:   Filed Vitals:   09/05/12 0400 09/05/12 0500 09/05/12 0800 09/05/12 0957  BP: 138/61  120/53   Pulse: 53  58   Temp: 98.2 F (36.8 C)  98 F (36.7 C)   TempSrc: Oral     Resp: 21  20   Height:      Weight: 123.514 kg (272 lb 4.8 oz) 124.15 kg (273 lb 11.2 oz)    SpO2: 98%  100% 99%   I&O's:   Intake/Output Summary (Last 24 hours) at 09/05/12 1004 Last data filed at 09/05/12 0443  Gross per 24 hour  Intake      3 ml  Output    750 ml  Net   -747 ml   TELEMETRY: Reviewed telemetry pt in atrial fibrillation:     PHYSICAL EXAM General: Well developed, well nourished, in no acute distress Head: Eyes PERRLA, No xanthomas.   Normal cephalic and atramatic  Lungs:   Clear bilaterally to auscultation and percussion. Heart:   HRRR S1 S2 Pulses are 2+ & equal. Abdomen: Bowel sounds are positive, abdomen soft and non-tender without masses  Extremities:   No clubbing, cyanosis or edema.  DP +1 Neuro: Alert and oriented X 3. Psych:  Good affect, responds appropriately   LABS: Basic Metabolic Panel:  Recent Labs  62/13/08 1730 09/04/12 1731 09/05/12 0809  NA 137  --  138  K 4.2  --  4.5  CL 91*  --  96  CO2 31  --  35*  GLUCOSE 178*  --  148*  BUN 37*  --  39*  CREATININE 0.97  --  1.01  CALCIUM 9.9  --  10.0  MG  --  2.0 2.2   Anemia Panel: No results found for this basename: VITAMINB12, FOLATE, FERRITIN, TIBC, IRON, RETICCTPCT,  in the last 72 hours Coag Panel:   Lab Results  Component Value Date   INR 2.18* 09/04/2012   INR 2.45* 09/02/2012   INR 2.23* 09/01/2012    RADIOLOGY: Dg Chest 2 View  09/03/2012   *RADIOLOGY REPORT*  Clinical Data: Shortness of breath, CHF  CHEST - 2 VIEW  Comparison: 09/02/2012  Findings: Low lung volumes with vascular crowding.  Mild right perihilar edema is possible, equivocal.  No pleural effusion or pneumothorax.  Cardiomegaly.  Mild degenerative changes of the visualized  thoracolumbar spine.  IMPRESSION: Low lung volumes with vascular crowding.  Mild right perihilar edema is possible, equivocal.   Original Report Authenticated By: Charline Bills, M.D.   Dg Chest 2 View  09/02/2012   *RADIOLOGY REPORT*  Clinical Data: Shortness of breath and chest pain.  CHEST - 2 VIEW  Comparison: 09/01/2012.  Findings: View is extremely apical lordotic.  Trachea is midline. Heart size is grossly stable.  Visualized portions of the lungs appear grossly clear.  No definite pleural fluid.  IMPRESSION: Suboptimal examination due to body habitus.  No definite acute findings.   Original Report Authenticated By: Leanna Battles, M.D.   Dg Chest 2 View  09/01/2012   *RADIOLOGY REPORT*  Clinical Data: Hypoxia, shortness of breath  CHEST - 2 VIEW  Comparison: 08/30/2012  Findings: Cardiomegaly again noted.  Mild interstitial prominence bilaterally.  Hazy airspace disease in the right upper lobe. Findings may be due to interstitial edema.  Superimposed pneumonia cannot be excluded.  Follow-up examination is recommended.  IMPRESSION: Mild interstitial prominence bilaterally.  Hazy airspace disease in the right upper lobe.  Findings  may be due to interstitial edema. Superimposed pneumonia cannot be excluded.  Follow-up examination is recommended.   Original Report Authenticated By: Natasha Mead, M.D.   Dg Chest Port 1 View  08/30/2012   *RADIOLOGY REPORT*  Clinical Data: Chest pain.  PORTABLE CHEST - 1 VIEW  Comparison: 08/17/2012  Findings: Shallow inspiration.  Cardiac enlargement with mild pulmonary vascular congestion.  Perihilar interstitial changes suggest mild interstitial edema.  No focal consolidation.  No blunting of costophrenic angles.  No pneumothorax.  Calcified and tortuous aorta.  No significant changes since the previous study.  IMPRESSION: Cardiac enlargement with mild pulmonary vascular congestion and mild interstitial edema.   Original Report Authenticated By: Burman Nieves, M.D.    Dg Chest Portable 1 View  08/11/2012   *RADIOLOGY REPORT*  Clinical Data: Shortness of breath.  PORTABLE CHEST - 1 VIEW  Comparison: Chest x-ray 08/06/2012.  Findings: Lung volumes are low.  Elevation of the right hemidiaphragm (unchanged). There is cephalization of the pulmonary vasculature, indistinctness of the interstitial markings, and patchy airspace disease throughout the lungs bilaterally suggestive of moderate pulmonary edema.  No definite pleural effusions.  Mild cardiomegaly is unchanged.  Upper mediastinal contours are within normal limits.  Atherosclerosis in the thoracic aorta.  IMPRESSION: 1.  The appearance of the chest, as above is suggestive of congestive heart failure. 2.  Atherosclerosis.   Original Report Authenticated By: Trudie Reed, M.D.   ASSESSMENT/PLAN:  1. Acute on chronic diastolic CHF with mildly elevated BNP from baseline last month. She is symptomatically improved after IV lasix . BNP is trending downward and she has put out over 4L net urine. She has no signs of RHF ( no LE edema) Her lungs are clear  I feel at this point she is euvolemic  2. Moderate to severe AS by recent echo - mean AV gradient is only and peak AV velocity 3cm/sec which is most consistent with moderate AS. Only minimal pulmonary HTN on echo RVSP which is essentially normal.  I will repeat an echo today to make sure nothing has changed since last echo. 3. Morbid obesity  4. Nonobstructive ASCAD by cath 2006  5. afib on chronic anticoagulation  6. Hypoxia - I think this is mainly due to obesity hypoventilation sydrome. She is morbidly obese and most likely has significant restrictive lung disease. Low lung volumes noted on chest xray.     Quintella Reichert, MD  09/05/2012  10:04 AM

## 2012-09-05 NOTE — Progress Notes (Signed)
Subjective: Foley out at 6a, has not urinated yet.  Feels that she is breathing fine.  BM last night.  Benadryl last night helped with itching and allowed her to sleep.  Objective: Vital signs in last 24 hours: Filed Vitals:   09/05/12 0400 09/05/12 0500 09/05/12 0800 09/05/12 0957  BP: 138/61  120/53   Pulse: 53  58   Temp: 98.2 F (36.8 C)  98 F (36.7 C)   TempSrc: Oral     Resp: 21  20   Height:      Weight: 272 lb 4.8 oz (123.514 kg) 273 lb 11.2 oz (124.15 kg)    SpO2: 98%  100% 99%   Weight change: -1 lb 4.8 oz (-0.59 kg)  Intake/Output Summary (Last 24 hours) at 09/05/12 1111 Last data filed at 09/05/12 1050  Gross per 24 hour  Intake      6 ml  Output    750 ml  Net   -744 ml   General: resting in recliner, no acute distress HEENT: PERRL, EOMI, no scleral icterus Cardiac: irregular, normal rate, no rubs, murmurs or gallops Pulm: clear to auscultation bilaterally, but decreased air movement, took several attempts to hear air mvmt in LLL Abd: soft, nontender, nondistended, BS normoactive Ext: warm and well perfused Neuro: alert and oriented X3, cranial nerves II-XII grossly intact  Lab Results: Basic Metabolic Panel:  Recent Labs Lab 09/04/12 1730 09/04/12 1731 09/05/12 0809  NA 137  --  138  K 4.2  --  4.5  CL 91*  --  96  CO2 31  --  35*  GLUCOSE 178*  --  148*  BUN 37*  --  39*  CREATININE 0.97  --  1.01  CALCIUM 9.9  --  10.0  MG  --  2.0 2.2   Liver Function Tests:  Recent Labs Lab 08/30/12 0743  AST 15  ALT 13  ALKPHOS 110  BILITOT 0.5  PROT 7.1  ALBUMIN 3.3*   CBC:  Recent Labs Lab 08/30/12 0230 08/30/12 0239 09/02/12 0502  WBC 10.5  --  6.2  NEUTROABS 7.6  --   --   HGB 10.3* 11.6* 9.4*  HCT 34.4* 34.0* 32.3*  MCV 87.5  --  89.0  PLT 277  --  243   BNP:  Recent Labs Lab 08/30/12 0230 09/02/12 0502 09/04/12 1220  PROBNP 1817.0* 1107.0* 1243.0*   CBG:  Recent Labs Lab 09/03/12 2149 09/04/12 0731  09/04/12 1215 09/04/12 1631 09/04/12 2047 09/05/12 0755  GLUCAP 201* 150* 192* 162* 182* 146*   Coagulation:  Recent Labs Lab 08/31/12 0556 09/01/12 0405 09/02/12 0502 09/04/12 0545  LABPROT 24.5* 24.0* 25.8* 23.6*  INR 2.29* 2.23* 2.45* 2.18*    Medications: I have reviewed the patient's current medications. Scheduled Meds: . allopurinol  100 mg Oral BID  . aspirin EC  81 mg Oral Daily  . atorvastatin  40 mg Oral Daily  . FLUoxetine  20 mg Oral BID  . furosemide  80 mg Oral BID  . gabapentin  100 mg Oral BID  . insulin aspart  0-5 Units Subcutaneous QHS  . insulin aspart  0-9 Units Subcutaneous TID WC  . levothyroxine  150 mcg Oral QAC breakfast  . mometasone-formoterol  2 puff Inhalation BID  . montelukast  10 mg Oral Daily  . pantoprazole  40 mg Oral Daily  . sodium chloride  3 mL Intravenous Q12H  . sodium chloride  3 mL Intravenous Q12H  . tiotropium  18 mcg Inhalation Daily  . warfarin  2.5 mg Oral Custom  . warfarin  5 mg Oral Custom  . Warfarin - Pharmacist Dosing Inpatient   Does not apply q1800   Continuous Infusions:  PRN Meds:.sodium chloride, acetaminophen, albuterol, diphenhydrAMINE, loperamide, sodium chloride  Assessment/Plan: Ms. Scantlin is a 79 yo F with history of sCHF, DM, afib, HTN, COPD and CKD admitted on 08/30/12 with complaints of SOB.  #Acute on Chronic sCHF with moderate AS: Stable. pBNP yesterday relatively unchanged even after significant IV diuretic tx.  No significant weight change overnight, and diuresis has decreased with PO lasix. No sig change in CO2 to suggestion contraction as she has been running 30-35 for several months (30 at admission).  No Cr bump & BUN has remained the same.   -Repeat echo today per cardiology (appreciate input) -Continue lasix 80 PO BID -Monitor I/Os, monitor for urination as foley d/c - bladder scan if no output after lunch -O2 sat with ambulation (baseline on 4L O2) - suspect obesity hypoventilation  contributing  -AM BMET, Mg  #DM: stable, SSI  #HTN: Orthostatics yesterday negative, pressures well controlled today  #Afib: anticoag with coumadin, rate control without BB  #COPD: Stable, no wheezing on exam -O2 to maintain SpO2 >92% -Advair, albuterol prn  #CKD 3: stable. Baseline GFR ~50  #UTI: resolved. Treated with ceftriaxone x3d.  #VTE ppx: anticoag with coumadin  Dispo: Disposition is deferred at this time, awaiting improvement of current medical problems.  Anticipated discharge in approximately 1-2 day(s).   The patient does have a current PCP (Aletta Edouard, MD) and does need an [facility] hospital follow-up appointment after discharge.  The patient does not have transportation limitations that hinder transportation to clinic appointments.  .Services Needed at time of discharge: Y = Yes, Blank = No PT:   OT:   RN:   Equipment:   Other:     LOS: 6 days   Belia Heman, MD 09/05/2012, 11:11 AM

## 2012-09-05 NOTE — Progress Notes (Signed)
Pt up to walk in hallway with walker and 4L O2. Pt walked to computer sub station and was very SOB SAO2 72. Quick recovery to 88% within one minute. Back to room SAO2 down to 76% sat on edge of bed SAO2 to 97% within . Pt SAO2 drops immediately with ambulation but does have quick recovery when at rest.

## 2012-09-06 LAB — GLUCOSE, CAPILLARY
Glucose-Capillary: 163 mg/dL — ABNORMAL HIGH (ref 70–99)
Glucose-Capillary: 172 mg/dL — ABNORMAL HIGH (ref 70–99)

## 2012-09-06 LAB — BASIC METABOLIC PANEL
BUN: 43 mg/dL — ABNORMAL HIGH (ref 6–23)
CO2: 37 mEq/L — ABNORMAL HIGH (ref 19–32)
Calcium: 10.1 mg/dL (ref 8.4–10.5)
Creatinine, Ser: 1.06 mg/dL (ref 0.50–1.10)

## 2012-09-06 LAB — MAGNESIUM: Magnesium: 2.1 mg/dL (ref 1.5–2.5)

## 2012-09-06 MED ORDER — POLYETHYLENE GLYCOL 3350 17 G PO PACK
17.0000 g | PACK | Freq: Every day | ORAL | Status: DC | PRN
  Administered 2012-09-06: 17 g via ORAL
  Filled 2012-09-06: qty 1

## 2012-09-06 MED ORDER — FUROSEMIDE 80 MG PO TABS: 80.0000 mg | ORAL_TABLET | Freq: Two times a day (BID) | ORAL | Status: DC

## 2012-09-06 NOTE — Progress Notes (Signed)
Subjective: Continues to feel well.  Does complain about some leg cramping overnight.  Reports SOB only on excerption.  No fever/chills, no cough.  No trouble urinating.  Does complain of some constipation.  Per nursing, patent desaturated to 72-76% while ambulating, recovered to 88% within one min and recovered to 97% once sitting down.  Objective: Vital signs in last 24 hours: Filed Vitals:   09/05/12 0957 09/05/12 1600 09/05/12 2135 09/06/12 0500  BP:  127/56 145/41 113/92  Pulse:  57 99 53  Temp:  98 F (36.7 C) 98.3 F (36.8 C) 97.7 F (36.5 C)  TempSrc:  Oral Oral Oral  Resp:  20 18 18   Height:      Weight:    272 lb 6.4 oz (123.56 kg)  SpO2: 99% 100% 100% 100%   Weight change: 1.6 oz (0.045 kg)  Intake/Output Summary (Last 24 hours) at 09/06/12 0820 Last data filed at 09/05/12 1050  Gross per 24 hour  Intake      3 ml  Output      0 ml  Net      3 ml   General: sitting in chair, talking in full sentences on 4LNC  HEENT: EOMI, no scleral icterus  Cardiac: RRR, distant heart sounds  Pulm: CTA B/L,  Poor respiratory effort  Abd: obese, soft, nontender, nondistended, BS normoactive  Ext: warm and well perfused, no pedal edema  Neuro: alert and oriented X3  Lab Results: Basic Metabolic Panel:  Recent Labs Lab 09/05/12 0809 09/06/12 0455  NA 138 137  K 4.5 4.3  CL 96 97  CO2 35* 37*  GLUCOSE 148* 172*  BUN 39* 43*  CREATININE 1.01 1.06  CALCIUM 10.0 10.1  MG 2.2 2.1   CBC:  Recent Labs Lab 09/02/12 0502  WBC 6.2  HGB 9.4*  HCT 32.3*  MCV 89.0  PLT 243   Cardiac Enzymes:  Recent Labs Lab 08/30/12 1115 08/30/12 1942  TROPONINI <0.30 <0.30   BNP:  Recent Labs Lab 09/02/12 0502 09/04/12 1220  PROBNP 1107.0* 1243.0*  CBG:  Recent Labs Lab 09/04/12 2047 09/05/12 0755 09/05/12 1142 09/05/12 1700 09/05/12 2157 09/06/12 0740  GLUCAP 182* 146* 287* 107* 156* 163*   Coagulation:  Recent Labs Lab 08/31/12 0556 09/01/12 0405  09/02/12 0502 09/04/12 0545  LABPROT 24.5* 24.0* 25.8* 23.6*  INR 2.29* 2.23* 2.45* 2.18*    Micro Results: Recent Results (from the past 240 hour(s))  URINE CULTURE     Status: None   Collection Time    08/30/12  3:02 AM      Result Value Range Status   Specimen Description URINE, CATHETERIZED   Final   Special Requests CX ADDED AT 0328 ON 161096   Final   Culture  Setup Time 08/30/2012 03:52   Final   Colony Count >=100,000 COLONIES/ML   Final   Culture PROTEUS MIRABILIS   Final   Report Status 09/01/2012 FINAL   Final   Organism ID, Bacteria PROTEUS MIRABILIS   Final  MRSA PCR SCREENING     Status: None   Collection Time    09/03/12  9:17 PM      Result Value Range Status   MRSA by PCR NEGATIVE  NEGATIVE Final   Comment:            The GeneXpert MRSA Assay (FDA     approved for NASAL specimens     only), is one component of a     comprehensive MRSA  colonization     surveillance program. It is not     intended to diagnose MRSA     infection nor to guide or     monitor treatment for     MRSA infections.   Studies/Results: No results found. Medications: I have reviewed the patient's current medications. Scheduled Meds: . allopurinol  100 mg Oral BID  . aspirin EC  81 mg Oral Daily  . atorvastatin  40 mg Oral Daily  . FLUoxetine  20 mg Oral BID  . furosemide  80 mg Oral BID  . gabapentin  100 mg Oral BID  . insulin aspart  0-5 Units Subcutaneous QHS  . insulin aspart  0-9 Units Subcutaneous TID WC  . levothyroxine  150 mcg Oral QAC breakfast  . mometasone-formoterol  2 puff Inhalation BID  . montelukast  10 mg Oral Daily  . pantoprazole  40 mg Oral Daily  . sodium chloride  3 mL Intravenous Q12H  . sodium chloride  3 mL Intravenous Q12H  . tiotropium  18 mcg Inhalation Daily  . warfarin  2.5 mg Oral Custom  . warfarin  5 mg Oral Custom  . Warfarin - Pharmacist Dosing Inpatient   Does not apply q1800   Continuous Infusions: none PRN Meds:.sodium chloride,  acetaminophen, albuterol, diphenhydrAMINE, loperamide, polyethylene glycol, sodium chloride Assessment/Plan: Acute on chronic systolic CHF (congestive heart failure) with moderate Aortic Stenosis  -Cardiology consulted- Mod severe AS, not canidate for TAVR at this time. If patient continues to have bouts of CHF may need to consider TEE to get a planimetered area of AV.  -Patient has known left sided heart failure but may also have component of right sided heart failure. Lungs are CTA but patient feels better after diuresis. Net I/O -5L since admission. weight has fluctuated greatly Probably due to poor weight taking. No I/O logged in last 24 hrs. -Continue lasix 80mg  PO BID.  DIABETES MELLITUS  -Stable  -Continue to hold home meds and continue SSI.  HYPERTENSION  -Well controlled -Monitor  Atrial fibrillation  -Coumadin per pharm, not tachycardic. COPD  -O2 supplement to maintain saturation >92%  -Albuterol prn  -Advair  CKD (chronic kidney disease) stage 3, GFR 30-59 ml/min  -Cr stable ~1. Hypoxia -Low lung volumes noted on CXR.  Patient appears euvolemic,  Has been on 4L O2 at home.  Likely secondary to obesity hypoventilation syndrome. UTI -resolved  -Treated with IV Ceftriaxone for 3 days  F/E/N  -K+ 4.3 this AM DVT PPx: A/C (coumadin)  Dispo: Disposition is deferred at this time, possible discharge today. The patient does have a current PCP (Aletta Edouard, MD) and does need an [facility] hospital follow-up appointment after discharge.  The patient does not know have transportation limitations that hinder transportation to clinic appointments.  .Services Needed at time of discharge: Y = Yes, Blank = No  PT:  Y   OT:    RN:  Y resume HHC with PT   Equipment:    Other:        LOS: 7 days   Carlynn Purl, DO 09/06/2012, 8:20 AM

## 2012-09-06 NOTE — Progress Notes (Signed)
SUBJECTIVE:  No complaints   OBJECTIVE:   Vitals:   Filed Vitals:   09/05/12 0957 09/05/12 1600 09/05/12 2135 09/06/12 0500  BP:  127/56 145/41 113/92  Pulse:  57 99 53  Temp:  98 F (36.7 C) 98.3 F (36.8 C) 97.7 F (36.5 C)  TempSrc:  Oral Oral Oral  Resp:  20 18 18   Height:      Weight:    123.56 kg (272 lb 6.4 oz)  SpO2: 99% 100% 100% 100%   I&O's:   Intake/Output Summary (Last 24 hours) at 09/06/12 1914 Last data filed at 09/06/12 0859  Gross per 24 hour  Intake    423 ml  Output      0 ml  Net    423 ml   TELEMETRY: Reviewed telemetry pt in atrial fibrillation     PHYSICAL EXAM General: Well developed, well nourished, in no acute distress Head: Eyes PERRLA, No xanthomas.   Normal cephalic and atramatic  Lungs:   Clear bilaterally to auscultation and percussion. Heart:   Irregularly irregular S1 S2 Pulses are 2+ & equal. Abdomen: Bowel sounds are positive, abdomen soft and non-tender without masses  Extremities:   No clubbing, cyanosis or edema.  DP +1 Neuro: Alert and oriented X 3. Psych:  Good affect, responds appropriately   LABS: Basic Metabolic Panel:  Recent Labs  78/29/56 0809 09/06/12 0455  NA 138 137  K 4.5 4.3  CL 96 97  CO2 35* 37*  GLUCOSE 148* 172*  BUN 39* 43*  CREATININE 1.01 1.06  CALCIUM 10.0 10.1  MG 2.2 2.1   Coag Panel:   Lab Results  Component Value Date   INR 2.18* 09/04/2012   INR 2.45* 09/02/2012   INR 2.23* 09/01/2012    RADIOLOGY: Dg Chest 2 View  09/03/2012   *RADIOLOGY REPORT*  Clinical Data: Shortness of breath, CHF  CHEST - 2 VIEW  Comparison: 09/02/2012  Findings: Low lung volumes with vascular crowding.  Mild right perihilar edema is possible, equivocal.  No pleural effusion or pneumothorax.  Cardiomegaly.  Mild degenerative changes of the visualized thoracolumbar spine.  IMPRESSION: Low lung volumes with vascular crowding.  Mild right perihilar edema is possible, equivocal.   Original Report Authenticated By:  Charline Bills, M.D.   Dg Chest 2 View  09/02/2012   *RADIOLOGY REPORT*  Clinical Data: Shortness of breath and chest pain.  CHEST - 2 VIEW  Comparison: 09/01/2012.  Findings: View is extremely apical lordotic.  Trachea is midline. Heart size is grossly stable.  Visualized portions of the lungs appear grossly clear.  No definite pleural fluid.  IMPRESSION: Suboptimal examination due to body habitus.  No definite acute findings.   Original Report Authenticated By: Leanna Battles, M.D.   Dg Chest 2 View  09/01/2012   *RADIOLOGY REPORT*  Clinical Data: Hypoxia, shortness of breath  CHEST - 2 VIEW  Comparison: 08/30/2012  Findings: Cardiomegaly again noted.  Mild interstitial prominence bilaterally.  Hazy airspace disease in the right upper lobe. Findings may be due to interstitial edema.  Superimposed pneumonia cannot be excluded.  Follow-up examination is recommended.  IMPRESSION: Mild interstitial prominence bilaterally.  Hazy airspace disease in the right upper lobe.  Findings may be due to interstitial edema. Superimposed pneumonia cannot be excluded.  Follow-up examination is recommended.   Original Report Authenticated By: Natasha Mead, M.D.   Dg Chest Port 1 View  08/30/2012   *RADIOLOGY REPORT*  Clinical Data: Chest pain.  PORTABLE CHEST - 1  VIEW  Comparison: 08/17/2012  Findings: Shallow inspiration.  Cardiac enlargement with mild pulmonary vascular congestion.  Perihilar interstitial changes suggest mild interstitial edema.  No focal consolidation.  No blunting of costophrenic angles.  No pneumothorax.  Calcified and tortuous aorta.  No significant changes since the previous study.  IMPRESSION: Cardiac enlargement with mild pulmonary vascular congestion and mild interstitial edema.   Original Report Authenticated By: Burman Nieves, M.D.   Dg Chest Portable 1 View  08/11/2012   *RADIOLOGY REPORT*  Clinical Data: Shortness of breath.  PORTABLE CHEST - 1 VIEW  Comparison: Chest x-ray 08/06/2012.   Findings: Lung volumes are low.  Elevation of the right hemidiaphragm (unchanged). There is cephalization of the pulmonary vasculature, indistinctness of the interstitial markings, and patchy airspace disease throughout the lungs bilaterally suggestive of moderate pulmonary edema.  No definite pleural effusions.  Mild cardiomegaly is unchanged.  Upper mediastinal contours are within normal limits.  Atherosclerosis in the thoracic aorta.  IMPRESSION: 1.  The appearance of the chest, as above is suggestive of congestive heart failure. 2.  Atherosclerosis.   Original Report Authenticated By: Trudie Reed, M.D.   ASSESSMENT/PLAN:  1. Acute on chronic diastolic CHF with mildly elevated BNP from baseline last month. She is symptomatically improved after IV lasix . BNP is trending downward and she has put out over 4L net urine. She has no signs of RHF ( no LE edema) Her lungs are clear I feel at this point she is euvolemic. Her I&O's do not appear to be documented for the past 24 hours.  Need to document I&O's daily. 2. Moderate to severe AS by recent echo - mean AV gradient is only and peak AV velocity 3cm/sec which is most consistent with moderate AS. Only minimal pulmonary HTN on echo RVSP which is essentially normal. I will check results of echo today to make sure nothing has changed since last echo.  3. Morbid obesity  4. Nonobstructive ASCAD by cath 2006  5. afib on chronic anticoagulation  6. Hypoxia - I think this is mainly due to obesity hypoventilation sydrome. She is morbidly obese and most likely has significant restrictive lung disease. Low lung volumes noted on chest xray.    ASSESSMENT/PLAN:  1. Acute on chronic diastolic CHF with mildly elevated BNP from baseline last month. She is symptomatically improved after IV lasix . BNP is trending downward and she has put out over 4L net urine. She has no signs of RHF ( no LE edema) Her lungs are clear I feel at this point she is  euvolemic.  I&O's incomplete over past 24 hours. 2. Moderate to severe AS by recent echo - mean AV gradient is only and peak AV velocity 3cm/sec which is most consistent with moderate AS. Only minimal pulmonary HTN on echo RVSP which is essentially normal. I check results of echo today to make sure nothing has changed since last echo.  3. Morbid obesity  4. Nonobstructive ASCAD by cath 2006  5. afib on chronic anticoagulation  6. Hypoxia - I think this is mainly due to obesity hypoventilation sydrome. She is morbidly obese and most likely has significant restrictive lung disease. Low lung volumes noted on chest xray. She tells me that she has been on 4L of O2 at home for months.       Quintella Reichert, MD  09/06/2012  9:38 AM

## 2012-09-06 NOTE — Progress Notes (Signed)
TC to case manager to resume home health services at time of discharge. Pt uses [facility]

## 2012-09-06 NOTE — Progress Notes (Signed)
2D echo reviewed.  Planimetered AVA is 1.2cm2 and mean gradient and peak velociy c/w moderate to severe AS but more on the moderate side.  COntinue current medical therapy.

## 2012-09-06 NOTE — Discharge Summary (Signed)
Name: [name] MRN: [id number] DOB: 05-26-1932 79 y.o. PCP: Aletta Edouard, MD  Date of Admission: 08/30/2012  2:04 AM Date of Discharge: 09/06/2012 Attending Physician: Dr. Kem Kays Discharge Diagnosis:   Acute on chronic systolic CHF (congestive heart failure)   Moderate to Severe Aortic stenosis   DIABETES MELLITUS   HYPERLIPIDEMIA   OBESITY, MORBID   HYPERTENSION   Atrial fibrillation   COPD   Long-term (current) use of anticoagulants   Acute on CKD (chronic kidney disease) stage 3, GFR 30-59 ml/min   CAD (coronary artery disease)   UTI (urinary tract infection)   HYPOTHYRODISM  Discharge Medications:   Medication List    STOP taking these medications       amLODipine 5 MG tablet  Commonly known as:  NORVASC      TAKE these medications       acetaminophen 500 MG tablet  Commonly known as:  TYLENOL  Take 1,000 mg by mouth 2 (two) times daily as needed. For pain.     albuterol 108 (90 BASE) MCG/ACT inhaler  Commonly known as:  PROVENTIL HFA  Inhale 2 puffs into the lungs every 6 (six) hours as needed. For wheezing.     allopurinol 100 MG tablet  Commonly known as:  ZYLOPRIM  Take 1 tablet (100 mg total) by mouth 2 (two) times daily.     aspirin 81 MG EC tablet  Take 1 tablet (81 mg total) by mouth daily.     atorvastatin 40 MG tablet  Commonly known as:  LIPITOR  Take 40 mg by mouth daily.     colchicine 0.6 MG tablet  Take 0.6 mg by mouth See admin instructions. Take for 3 days when gout flares up     FLUoxetine 20 MG capsule  Commonly known as:  PROZAC  Take 20 mg by mouth 2 (two) times daily.     Fluticasone-Salmeterol 250-50 MCG/DOSE Aepb  Commonly known as:  ADVAIR  Inhale 1 puff into the lungs every 12 (twelve) hours.     furosemide 80 MG tablet  Commonly known as:  LASIX  Take 1 tablet (80 mg total) by mouth 2 (two) times daily.     gabapentin 100 MG capsule  Commonly known as:  NEURONTIN  Take 100 mg by mouth 2 (two) times daily.     glipiZIDE 10 MG tablet  Commonly known as:  GLUCOTROL  Take 5 mg by mouth 2 (two) times daily before a meal.     levothyroxine 150 MCG tablet  Commonly known as:  SYNTHROID  Take 1 tablet (150 mcg total) by mouth daily.     loperamide 2 MG capsule  Commonly known as:  IMODIUM  Take 1 capsule (2 mg total) by mouth as needed for diarrhea or loose stools (up to total 16mg  daily).     montelukast 10 MG tablet  Commonly known as:  SINGULAIR  Take 1 tablet (10 mg total) by mouth daily.     omeprazole 20 MG capsule  Commonly known as:  PRILOSEC  Take 40 mg by mouth 2 (two) times daily.     SPIRIVA HANDIHALER 18 MCG inhalation capsule  Generic drug:  tiotropium  PLACE ONE CAPSULE INTO DEVICE AND INHALE 1 PUFF DAILY     warfarin 5 MG tablet  Commonly known as:  COUMADIN  Take 5 mg by mouth daily.        Disposition and follow-up:   [name] was discharged from [facility] in Stable condition.  At the hospital follow up visit please address:  1.  Assist in getting patient proper weight scale.  Assess home O2 needs in 2-3 months.  Assess for symptoms of progressive AS  2.  Labs / imaging needed at time of follow-up: BMet (increased home lasix dose)  3.  Pending labs/ test needing follow-up: none  Follow-up Appointments:     Follow-up Information   Schedule an appointment as soon as possible for a visit with Quintella Reichert, MD. (Please make appointment for early next week.)    Contact information:   [address] [place] [address] [phone]       Schedule an appointment as soon as possible for a visit with Aletta Edouard, MD. (please make appointment within the next month)    Contact information:   [address] [place] [address] [phone]       Discharge Instructions: Discharge Orders   Future Appointments Provider Department Dept Phone   09/15/2012 10:45 AM Aletta Edouard, MD [facility] [phone]   10/13/2012 8:30 AM Sherrie George, MD [facility] [phone]   Future Orders Complete By Expires     (HEART FAILURE PATIENTS) Call MD:  Anytime you have any of the following symptoms: 1) 3 pound weight gain in 24 hours or 5 pounds in 1 week 2) shortness of breath, with or without a dry hacking cough 3) swelling in the hands, feet or stomach 4) if you have to sleep on extra pillows at night in order to breathe.  As directed     Call MD for:  difficulty breathing, headache or visual disturbances  As directed     Call MD for:  extreme fatigue  As directed     Call MD for:  temperature >100.4  As directed     Diet - low sodium heart healthy  As directed     Discharge instructions  As directed     Comments:      Please take Lasix 80mg  (1tab) twice a day. Please make appointment with Dr. Mayford Knife within next week.    Increase activity slowly  As directed        Consultations: Treatment Team:  Quintella Reichert, MD  Procedures Performed:  Dg Chest 2 View  09/03/2012   *RADIOLOGY REPORT*  Clinical Data: Shortness of breath, CHF  CHEST - 2 VIEW  Comparison: 09/02/2012  Findings: Low lung volumes with vascular crowding.  Mild right perihilar edema is possible, equivocal.  No pleural effusion or pneumothorax.  Cardiomegaly.  Mild degenerative changes of the visualized thoracolumbar spine.  IMPRESSION: Low lung volumes with vascular crowding.  Mild right perihilar edema is possible, equivocal.   Original Report Authenticated By: Charline Bills, M.D.   Dg Chest 2 View  09/02/2012   *RADIOLOGY REPORT*  Clinical Data: Shortness of breath and chest pain.  CHEST - 2 VIEW  Comparison: 09/01/2012.  Findings: View is extremely apical lordotic.  Trachea is midline. Heart size is grossly stable.  Visualized portions of the lungs appear grossly clear.  No definite pleural fluid.  IMPRESSION: Suboptimal examination due to body habitus.  No definite acute  findings.   Original Report Authenticated By: Leanna Battles, M.D.   Dg Chest 2 View  09/01/2012   *RADIOLOGY REPORT*  Clinical Data: Hypoxia, shortness of breath  CHEST - 2 VIEW  Comparison: 08/30/2012  Findings: Cardiomegaly again noted.  Mild  interstitial prominence bilaterally.  Hazy airspace disease in the right upper lobe. Findings may be due to interstitial edema.  Superimposed pneumonia cannot be excluded.  Follow-up examination is recommended.  IMPRESSION: Mild interstitial prominence bilaterally.  Hazy airspace disease in the right upper lobe.  Findings may be due to interstitial edema. Superimposed pneumonia cannot be excluded.  Follow-up examination is recommended.   Original Report Authenticated By: Natasha Mead, M.D.   Dg Chest Port 1 View  08/30/2012   *RADIOLOGY REPORT*  Clinical Data: Chest pain.  PORTABLE CHEST - 1 VIEW  Comparison: 08/17/2012  Findings: Shallow inspiration.  Cardiac enlargement with mild pulmonary vascular congestion.  Perihilar interstitial changes suggest mild interstitial edema.  No focal consolidation.  No blunting of costophrenic angles.  No pneumothorax.  Calcified and tortuous aorta.  No significant changes since the previous study.  IMPRESSION: Cardiac enlargement with mild pulmonary vascular congestion and mild interstitial edema.   Original Report Authenticated By: Burman Nieves, M.D.   Dg Chest Portable 1 View  08/11/2012   *RADIOLOGY REPORT*  Clinical Data: Shortness of breath.  PORTABLE CHEST - 1 VIEW  Comparison: Chest x-ray 08/06/2012.  Findings: Lung volumes are low.  Elevation of the right hemidiaphragm (unchanged). There is cephalization of the pulmonary vasculature, indistinctness of the interstitial markings, and patchy airspace disease throughout the lungs bilaterally suggestive of moderate pulmonary edema.  No definite pleural effusions.  Mild cardiomegaly is unchanged.  Upper mediastinal contours are within normal limits.  Atherosclerosis in the  thoracic aorta.  IMPRESSION: 1.  The appearance of the chest, as above is suggestive of congestive heart failure. 2.  Atherosclerosis.   Original Report Authenticated By: Trudie Reed, M.D.    2D Echo: Study Conclusions  - Left ventricle: There was moderate focal basal and mild concentric hypertrophy. Systolic function was normal. The estimated ejection fraction was in the range of 55% to 60%. Wall motion was normal; there were no regional wall motion abnormalities. - Aortic valve: Severe diffuse thickening and calcification. Mild regurgitation. Valve area: 0.74cm^2(VTI). Valve area: 0.87cm^2 (Vmax). - Mitral valve: Moderately calcified annulus. Mildly thickened leaflets . Moderate regurgitation. Valve area by continuity equation (using LVOT flow): 1.66cm^2. - Left atrium: The atrium was moderately dilated. - Right ventricle: The cavity size was moderately dilated. Wall thickness was normal. Systolic function was mildly reduced. - Right atrium: The atrium was moderately dilated. - Pulmonary arteries: PA peak pressure: 48mm Hg (S). Impressions:  - The right ventricular systolic pressure was increased consistent with mild pulmonary hypertension. Transthoracic echocardiography. M-mode, complete 2D, spectral Doppler, and color Doppler. Height: Height: 152.4cm. Height: 60in. Weight: Weight: 124.2kg. Weight: 273.2lb. Body mass index: BMI: 53.5kg/m^2. Body surface area: BSA: 2.73m^2. Blood pressure: 120/53. Patient status: Inpatient. Location: Bedside.    Admission HPI: VERSA CRATON is a 79 y.o. female with a pmhx of CHF, AS, Afib on warfarin, CKD, CVA, and PE who presented to the ED with a cc of SOB. Since last being d/c from [facility] on 08/15/12, the patient had been doing well. However, at 1 am the patient began to cough and feel increasing SOB. The patient states that the problem began about an hour after she laid down to go to bed for the night. The patient states that this  episode felt similar to her previous experiences with having fluid "on her lungs". She denies chest pain, headache, and lightheadedness. She does not have a scale that she can weigh herself at home, but she thinks that her weight  has not changed much since discharge. Of note, the patient reports that she has been compliant with her medications since discharge.  On ROS, the patient endorses dysuria for 5 days. The patient states that she thinks that she has a UTI.  Physical Exam on admission:  Blood pressure 151/51, pulse 64, temperature 99 F (37.2 C), temperature source Rectal, resp. rate 24, height 5' (1.524 m), weight 273 lb (123.832 kg), last menstrual period 05/22/1968, SpO2 98.00%.  Physical Exam  Constitutional: She is oriented to person, place, and time. She appears well-developed and well-nourished. No distress. She is not intubated.  HENT:  Head: Normocephalic and atraumatic.  Eyes: Conjunctivae and EOM are normal. Pupils are equal, round, and reactive to light.  Neck: Normal range of motion. No tracheal deviation present.  Cardiovascular: Normal rate and regular rhythm. Exam reveals distant heart sounds.  Pulmonary/Chest: No accessory muscle usage. Tachypnea noted. No apnea and not bradypneic. She is not intubated. She is in respiratory distress. She has decreased breath sounds in the right upper field, the right middle field, the right lower field, the left upper field, the left middle field and the left lower field. She has no wheezes. She has no rales.  Neurological: She is alert and oriented to person, place, and time.  Skin: Skin is warm and dry. She is not diaphoretic. No erythema. No pallor.  Psychiatric: She has a normal mood and affect. Her behavior is normal. Judgment and thought content normal.   Lab results on admission:  Basic Metabolic Panel:   Recent Labs   08/30/12 0239   NA  140   K  4.4   CL  100   GLUCOSE  189*   BUN  37*   CREATININE  1.30*    CBC:    Recent Labs   08/30/12 0230  08/30/12 0239   WBC  10.5  --   NEUTROABS  7.6  --   HGB  10.3*  11.6*   HCT  34.4*  34.0*   MCV  87.5  --   PLT  277  --    BNP:  Recent Labs   08/30/12 0230   PROBNP  1817.0*    Coagulation:  Recent Labs   08/30/12 0230   LABPROT  26.5*   INR  2.54*    Urinalysis:  Recent Labs   08/30/12 0302   COLORURINE  YELLOW   LABSPEC  1.017   PHURINE  7.5   GLUCOSEU  NEGATIVE   HGBUR  SMALL*   BILIRUBINUR  NEGATIVE   KETONESUR  NEGATIVE   PROTEINUR  30*   UROBILINOGEN  0.2   NITRITE  POSITIVE*   LEUKOCYTESUR  [facility] problem list: Principal Problem:   Acute on chronic CHF (congestive heart failure) with preserved EF, with moderate Aortic stenosis -Patient presented for SOB.  She was recently discharged from [facility] on [facility]/15/12 for acute on chronic CHF.  She reported that her symptoms on presentation at the ED were similar to other CHF exacerbations.  She was found to have rales on exam.  In addition she was found to have a UTI and AKI, which was believed to have contributed to her exacerbation.  Her home lasix dose of 80mg  PO in AM and 40mg  PO in PM was changed to 80mg  IV Lasix BID.  She was diuresed for two days and cardiology was consulted to reevaluate the role of her AS in her frequent exacerbations.  Cardiology reported that her AS was mod to severe but more on the moderate side and TAVR was not indicated at this time.  She was ambulated with 4 L of O2 and desaturated to 73%.  Her Lasix were increased to 80mg  IV TID.  She had a net loss of 4L of water.  Her SOB improved and she was discharged on an increased home dose of 80mg  Lasix PO BID.   DIABETES MELLITUS with neuropathy -Last A1C was 6.4 on 06/26/12.  Home medications were held, patient was placed on SSI-S.  Continue home glipizide at discharge. Continued Gabapentin 100 mg bid.   Hypothyroidism -TSH 4.1 on 06/26/12.  Home dose of Levothyroxine daily  was continued.   HYPERTENSION  -Patient's hypertension was initially controlled with 5mg  amlodipine along with lasix dosing.  However as patient diuresed she had some low diastolic pressures in the setting of Aortic stenosis.  Her amlodipine was held and she was discharged with amlodipine discontinued.   Atrial fibrillation on chronic anticoagulation -Coumadin was continued per pharmacy management.  Has a history of sick sinus syndrome and intolerance to beta blockers.   COPD -Patient was continued on hospital formulary of home medications and O2 at 4L [place].   [facility]cue on CKD (chronic kidney disease) stage 3, GFR 30-59 ml/min -Cr. 1.06 on 08/15/12, on admission 1.30.  Patient presented with UTI which was treated and Creatinine trended down to 1.06 on discharge.   UTI (urinary tract infection) -Patient reported dysuria on admission, UA positive for nitrites and leuk.  Culture positive for proteus mirabilis sensitive to ceftriaxone.  Patient was treated with ceftriaxone for 3 days.  Dysuria resolved.    Discharge Vitals:   BP 158/83  Pulse 49  Temp(Src) 97.9 F (36.6 C) (Oral)  Resp 19  Ht 5' (1.524 m)  Wt 272 lb 6.4 oz (123.56 kg)  BMI 53.2 kg/m2  SpO2 97%  LMP 05/22/1968  Discharge Labs:  Results for orders placed during the hospital encounter of 08/30/12 (from the past 24 hour(s))  GLUCOSE, CAPILLARY     Status: Abnormal   Collection Time    09/05/12  5:00 PM      Result Value Range   Glucose-Capillary 107 (*) 70 - 99 mg/dL  GLUCOSE, CAPILLARY     Status: Abnormal   Collection Time    09/05/12  9:57 PM      Result Value Range   Glucose-Capillary 156 (*) 70 - 99 mg/dL  BASIC METABOLIC PANEL     Status: Abnormal   Collection Time    09/06/12  4:55 AM      Result Value Range   Sodium 137  135 - 145 mEq/L   Potassium 4.3  3.5 - 5.1 mEq/L   Chloride 97  96 - 112 mEq/L   CO2 37 (*) 19 - 32 mEq/L   Glucose, Bld 172 (*) 70 - 99 mg/dL   BUN 43 (*) 6 - 23 mg/dL   Creatinine, Ser 6.21   0.50 - 1.10 mg/dL   Calcium 30.8  8.4 - 65.7 mg/dL   GFR calc non Af Amer 49 (*) >90 mL/min   GFR calc Af Amer 57 (*) >90 mL/min  MAGNESIUM     Status: None   Collection Time    09/06/12  4:55 AM      Result Value Range   Magnesium 2.1  1.5 - 2.5 mg/dL  GLUCOSE, CAPILLARY     Status: Abnormal   Collection Time  09/06/12  7:40 AM      Result Value Range   Glucose-Capillary 163 (*) 70 - 99 mg/dL   Comment 1 Documented in Chart     Comment 2 Notify RN      Signed: Carlynn Purl, DO 09/06/2012, 12:09 PM   Time Spent on Discharge: 45 minutes Services Ordered on Discharge: HHC with PT Equipment Ordered on Discharge: none

## 2012-09-07 NOTE — Progress Notes (Signed)
09/07/2012 0900 NCM spoke to [facility] rep to make aware of pts dc home on 7/13. Isidoro Donning RN CCM Case Mgmt phone [phone]

## 2012-09-11 NOTE — Discharge Summary (Signed)
  Date: 09/11/2012  Patient name: [name]  Medical record number: [id number]  Date of birth: Jun 30, 1932   This patient has been discussed with the house staff. Please see their note for complete details. I concur with their findings and plan.  Jonah Blue, DO 09/11/2012, 12:22 PM

## 2012-09-15 ENCOUNTER — Encounter: Payer: Self-pay | Admitting: Internal Medicine

## 2012-09-15 ENCOUNTER — Ambulatory Visit (INDEPENDENT_AMBULATORY_CARE_PROVIDER_SITE_OTHER): Payer: Medicare PPO | Admitting: Internal Medicine

## 2012-09-15 VITALS — BP 134/55 | HR 54 | Temp 96.8°F | Ht 60.0 in | Wt 273.3 lb

## 2012-09-15 DIAGNOSIS — Z09 Encounter for follow-up examination after completed treatment for conditions other than malignant neoplasm: Secondary | ICD-10-CM

## 2012-09-15 DIAGNOSIS — I130 Hypertensive heart and chronic kidney disease with heart failure and stage 1 through stage 4 chronic kidney disease, or unspecified chronic kidney disease: Secondary | ICD-10-CM

## 2012-09-15 DIAGNOSIS — M109 Gout, unspecified: Secondary | ICD-10-CM

## 2012-09-15 DIAGNOSIS — I359 Nonrheumatic aortic valve disorder, unspecified: Secondary | ICD-10-CM

## 2012-09-15 DIAGNOSIS — E119 Type 2 diabetes mellitus without complications: Secondary | ICD-10-CM

## 2012-09-15 DIAGNOSIS — I4891 Unspecified atrial fibrillation: Secondary | ICD-10-CM

## 2012-09-15 DIAGNOSIS — IMO0001 Reserved for inherently not codable concepts without codable children: Secondary | ICD-10-CM

## 2012-09-15 DIAGNOSIS — I1310 Hypertensive heart and chronic kidney disease without heart failure, with stage 1 through stage 4 chronic kidney disease, or unspecified chronic kidney disease: Secondary | ICD-10-CM | POA: Insufficient documentation

## 2012-09-15 DIAGNOSIS — I509 Heart failure, unspecified: Secondary | ICD-10-CM

## 2012-09-15 DIAGNOSIS — I5032 Chronic diastolic (congestive) heart failure: Secondary | ICD-10-CM | POA: Insufficient documentation

## 2012-09-15 DIAGNOSIS — Z7901 Long term (current) use of anticoagulants: Secondary | ICD-10-CM

## 2012-09-15 DIAGNOSIS — I350 Nonrheumatic aortic (valve) stenosis: Secondary | ICD-10-CM

## 2012-09-15 LAB — COMPLETE METABOLIC PANEL WITH GFR
ALT: 21 U/L (ref 0–35)
AST: 20 U/L (ref 0–37)
Alkaline Phosphatase: 125 U/L — ABNORMAL HIGH (ref 39–117)
BUN: 37 mg/dL — ABNORMAL HIGH (ref 6–23)
Calcium: 10.4 mg/dL (ref 8.4–10.5)
Chloride: 96 mEq/L (ref 96–112)
Creat: 1.3 mg/dL — ABNORMAL HIGH (ref 0.50–1.10)
Total Bilirubin: 0.3 mg/dL (ref 0.3–1.2)

## 2012-09-15 LAB — MAGNESIUM: Magnesium: 2.4 mg/dL (ref 1.5–2.5)

## 2012-09-15 LAB — POCT INR: INR: 2.6

## 2012-09-15 LAB — GLUCOSE, CAPILLARY: Glucose-Capillary: 173 mg/dL — ABNORMAL HIGH (ref 70–99)

## 2012-09-15 NOTE — Assessment & Plan Note (Signed)
Current creatinine as lasix dose has been increased is 1.30  Lab Results  Component Value Date   CREATININE 1.30* 09/15/2012   CREATININE 1.06 09/06/2012   CREATININE 1.01 09/05/2012   We will keep this dose of lasix for now, and recheck BMP in 15 days.

## 2012-09-15 NOTE — Assessment & Plan Note (Signed)
She is not having any flares at this time. We will continue allopurinol as per prescription.

## 2012-09-15 NOTE — Progress Notes (Signed)
Subjective:     Patient ID: [name], female   DOB: [birth year], 79 y.o.   MRN: [id number]  HPI Ms Turski is my 79 year old lady who has moderate to severe aortic stenosis, congestive heart failure for which she has been in and out of the hospital, gout, diabetes, hypothyroidism, atrial fibrillation and morbid obesity. Her most recent admission was on 08/30/12 for CHF again, and she was discharged on 09/06/12. She says that she has been taking all her medications faithfully. She has a helper who is making her low salt, and low fat meals. Her weight has been doing well, however, she has trouble measuring it at home, because she cannot stand still even with the help of the walker, and the weighing machine she has keeps giving her fluctuating numbers because she keeps shifting. She is taking coumadin as prescribed. She denies any chest pain since discharge, she is on oxygen 4L and is saturating at 91% in her wheelchair, though she does get short of breath whenever she does any small chore. She is pretty much wheelchair bound at this time. Her gout is well controlled at this time, and even though she has a red right big toe, she does not endorse any pain at this time. She denies dizziness, weakness, changes in appetite. She says she is urinating a lot because she is taking a lot of lasix.     Review of Systems As per HPI    Objective:   Physical Exam  Constitutional: She is oriented to person, place, and time. She appears well-developed and well-nourished.  Obese  HENT:  Head: Normocephalic and atraumatic.  Eyes: Conjunctivae are normal. Pupils are equal, round, and reactive to light.  Cardiovascular: Normal rate.  An irregularly irregular rhythm present. Exam reveals no gallop.   Murmur (systolic) heard. Pulmonary/Chest: Breath sounds normal. No respiratory distress.  Abdominal: Soft.  Neurological: She is alert and oriented to person, place, and time.  Skin: Skin is warm.  Pedal Edema:  Minimal     Assessment:     Problem based.  RTC in 1 month.

## 2012-09-15 NOTE — Assessment & Plan Note (Signed)
INR today is 2.6. I will continue current regimen of coumadin for now.

## 2012-09-15 NOTE — Assessment & Plan Note (Signed)
The patient is just discharged from the [facility], admitted for a acute on chronic congestive heart failure episode. She is on 80 mg of Lasix two times a day, is subjectively feeling lighter and better. No chest pain, dyspnea. Her weight today is 273 lbs. She is diuresing well, and tolerating the dose well.   - For now we will keep her on the same dose.  - We will check a BMP today and in 15 days.  - We will confirm her appointment with Dr. Armanda Magic (August 14th, 2014).  - The patient will come in for follow up in 1 month. By that time, she would have done her cardiology appointment.

## 2012-09-15 NOTE — Assessment & Plan Note (Signed)
Moderate to Severe. The patient already has cardiology follow up and I will defer management to cardiology at this time.

## 2012-09-15 NOTE — Patient Instructions (Addendum)
[name],   Please keep taking your lasix the way you are right now. You seem to be doing well on it. We will continue the oxygen the same way.  Today we have done some blood work. I will call you if results are abnormal.  A low salt, low fat diet is good for you.  I am glad that your gout is under control for now. We did Coumadin levels today and they are good. Please continue taking the same dose of coumadin.  Let us see each other in 1 month  Thanks, Aletta Edouard MD MPH 09/15/2012 11:58 AM

## 2012-09-15 NOTE — Assessment & Plan Note (Signed)
Will keep current regimen.

## 2012-09-16 NOTE — Progress Notes (Signed)
Talked with pt this AM - repeat labs in 15 days per Dr Dalphine Handing - reminder mailed to pt about repeating labs. Stanton Kidney Iracema Lanagan RN 09/16/12 11:30AM

## 2012-09-20 ENCOUNTER — Other Ambulatory Visit: Payer: Self-pay | Admitting: Internal Medicine

## 2012-09-22 ENCOUNTER — Telehealth: Payer: Self-pay | Admitting: *Deleted

## 2012-09-22 NOTE — Telephone Encounter (Signed)
Received a call from Nicki Guadalajara, RN with [facility]  - # [phone] Nurse is asking when we would like next  PT/INR drawn for pt. Last INR was during clinic visit on 7/22 - INR 2.6  Please advise

## 2012-09-22 NOTE — Telephone Encounter (Signed)
2 weeks from last INR if the patient is stable, with no new symptoms and with no bleeding episodes.

## 2012-09-24 NOTE — Telephone Encounter (Signed)
Talked with HHN and orders given

## 2012-09-25 ENCOUNTER — Other Ambulatory Visit: Payer: Self-pay | Admitting: Internal Medicine

## 2012-09-29 ENCOUNTER — Telehealth: Payer: Self-pay | Admitting: *Deleted

## 2012-09-29 NOTE — Telephone Encounter (Signed)
Agree with Dr. Saralyn Pilar assessment/plan.

## 2012-09-29 NOTE — Telephone Encounter (Signed)
Call from [place], [place]  with [facility] #   [phone] Nurse reports pt's  INR is 3.4  And  PT is 43 Patient is taking coumadin  5 mg daily.  Please advise.

## 2012-09-29 NOTE — Telephone Encounter (Signed)
I talked with Dr Alexandria Lodge and he recommends coumadin 5 mg daily except Wednesdays, on Wednesday coumadin 2.5 mg ( 1/2 tablet )  Recheck INR in 2 weeks.  HHN informed

## 2012-09-30 ENCOUNTER — Telehealth: Payer: Self-pay | Admitting: Pharmacist

## 2012-09-30 ENCOUNTER — Telehealth: Payer: Self-pay | Admitting: *Deleted

## 2012-09-30 NOTE — Telephone Encounter (Signed)
Called by Westley Hummer, RN Triage Nurse [facility] on a non-clinic day regarding an INR valued called to her by the [facility] RN visiting with the patient in the home setting. No untoward events noted or commented upon by the Triage Nurse to me (based upon [facility] RN assessment). Patient's INR = 3.4 on 5mg  warfarin PO QD (35mg /wk total). I advised decreasing dose to 5mg  PO QD for ALL DAYS OF WEEK EXCEPT WEDNESDAYS--then take only 1/2x 5mg  = 2.5 mg for total weekly dose 32.5mg /wk. Repeat INR in 2 weeks. RTC or ED if any signs or symptoms of bleeding occur.

## 2012-09-30 NOTE — Telephone Encounter (Signed)
[facility] f/u nurse and pt call, pt states she got a dulera(?SP?) inhaler while in hosp and has been using it, it seems to work very well for pt, will you please prescribe this and send to her pharm?  thanks

## 2012-09-30 NOTE — Telephone Encounter (Signed)
That was a [facility] formulary change for her advair while admitted. Advair is the same type of med. If she wants to change, she needs to make appt with PCP to discuss AND call her insurance company and see if Elwin Sleight is covered on her plan.

## 2012-10-01 NOTE — Telephone Encounter (Signed)
Called and lm for pt to make an appt and be seen for this change, left message with her aide

## 2012-10-02 ENCOUNTER — Other Ambulatory Visit: Payer: Self-pay | Admitting: *Deleted

## 2012-10-03 ENCOUNTER — Other Ambulatory Visit: Payer: Self-pay | Admitting: Internal Medicine

## 2012-10-05 ENCOUNTER — Other Ambulatory Visit: Payer: Self-pay | Admitting: *Deleted

## 2012-10-05 MED ORDER — FUROSEMIDE 80 MG PO TABS: 80.0000 mg | ORAL_TABLET | Freq: Two times a day (BID) | ORAL | Status: DC

## 2012-10-05 NOTE — Telephone Encounter (Signed)
Not on med list and imodium is. PCP will need to decide

## 2012-10-05 NOTE — Telephone Encounter (Signed)
Has Sep appt with PCP 

## 2012-10-12 ENCOUNTER — Other Ambulatory Visit: Payer: Self-pay | Admitting: *Deleted

## 2012-10-12 MED ORDER — FLUOXETINE HCL 20 MG PO CAPS: 20.0000 mg | ORAL_CAPSULE | Freq: Two times a day (BID) | ORAL | Status: DC

## 2012-10-12 NOTE — Telephone Encounter (Signed)
Ativan and Ditrol are not on med list and have not been Rx'd in some time. Denied

## 2012-10-13 ENCOUNTER — Encounter (INDEPENDENT_AMBULATORY_CARE_PROVIDER_SITE_OTHER): Payer: Medicare PPO | Admitting: Ophthalmology

## 2012-10-13 DIAGNOSIS — H43819 Vitreous degeneration, unspecified eye: Secondary | ICD-10-CM

## 2012-10-13 DIAGNOSIS — H353290 Exudative age-related macular degeneration, unspecified eye, stage unspecified: Secondary | ICD-10-CM

## 2012-10-13 DIAGNOSIS — H3530 Unspecified macular degeneration: Secondary | ICD-10-CM

## 2012-10-13 DIAGNOSIS — I10 Essential (primary) hypertension: Secondary | ICD-10-CM

## 2012-10-13 DIAGNOSIS — H35039 Hypertensive retinopathy, unspecified eye: Secondary | ICD-10-CM

## 2012-10-13 NOTE — Telephone Encounter (Signed)
Thank you Dr. Groce 

## 2012-10-19 ENCOUNTER — Other Ambulatory Visit: Payer: Self-pay | Admitting: Internal Medicine

## 2012-10-28 ENCOUNTER — Other Ambulatory Visit: Payer: Self-pay | Admitting: *Deleted

## 2012-10-30 MED ORDER — LORAZEPAM 1 MG PO TABS: 1.0000 mg | ORAL_TABLET | Freq: Every evening | ORAL | Status: DC | PRN

## 2012-11-02 NOTE — Telephone Encounter (Signed)
Called to pharm 

## 2012-11-03 ENCOUNTER — Encounter: Payer: Medicare PPO | Admitting: Internal Medicine

## 2012-11-04 ENCOUNTER — Other Ambulatory Visit: Payer: Self-pay | Admitting: Internal Medicine

## 2012-11-09 ENCOUNTER — Other Ambulatory Visit: Payer: Self-pay | Admitting: Internal Medicine

## 2012-11-10 ENCOUNTER — Ambulatory Visit (INDEPENDENT_AMBULATORY_CARE_PROVIDER_SITE_OTHER): Payer: Medicare PPO | Admitting: Internal Medicine

## 2012-11-10 ENCOUNTER — Encounter: Payer: Self-pay | Admitting: Internal Medicine

## 2012-11-10 VITALS — BP 143/53 | HR 60 | Temp 97.3°F | Ht 60.0 in | Wt 273.3 lb

## 2012-11-10 DIAGNOSIS — N390 Urinary tract infection, site not specified: Secondary | ICD-10-CM

## 2012-11-10 DIAGNOSIS — I5032 Chronic diastolic (congestive) heart failure: Secondary | ICD-10-CM

## 2012-11-10 DIAGNOSIS — I4891 Unspecified atrial fibrillation: Secondary | ICD-10-CM

## 2012-11-10 DIAGNOSIS — I509 Heart failure, unspecified: Secondary | ICD-10-CM

## 2012-11-10 DIAGNOSIS — N63 Unspecified lump in unspecified breast: Secondary | ICD-10-CM | POA: Insufficient documentation

## 2012-11-10 DIAGNOSIS — R928 Other abnormal and inconclusive findings on diagnostic imaging of breast: Secondary | ICD-10-CM

## 2012-11-10 DIAGNOSIS — E119 Type 2 diabetes mellitus without complications: Secondary | ICD-10-CM

## 2012-11-10 DIAGNOSIS — Z23 Encounter for immunization: Secondary | ICD-10-CM

## 2012-11-10 LAB — GLUCOSE, CAPILLARY: Glucose-Capillary: 100 mg/dL — ABNORMAL HIGH (ref 70–99)

## 2012-11-10 MED ORDER — CIPROFLOXACIN HCL 500 MG PO TABS: 500.0000 mg | ORAL_TABLET | Freq: Two times a day (BID) | ORAL | Status: AC

## 2012-11-10 MED ORDER — CIPROFLOXACIN HCL 500 MG PO TABS: 500.0000 mg | ORAL_TABLET | Freq: Two times a day (BID) | ORAL | Status: DC

## 2012-11-10 NOTE — Patient Instructions (Addendum)
1. Please take Ciprofloxacin 500 mg twice a day for 7 days.  2. Please come back on 11/16/12 (next Monday) for checking your INR. This is very important! You do not need appointment for doing the INR lab test. 3. If you have worsening of your symptoms or new symptoms arise, please call the clinic ([phone]), or go to the ER immediately if symptoms are severe.  You have done great job in taking all your medications. I appreciate it very much. Please continue doing that.

## 2012-11-10 NOTE — Progress Notes (Addendum)
Patient ID: [name], female   DOB: 06-23-1932, 79 y.o.   MRN: [id number] Subjective:   Patient ID: [name] female   DOB: [birth year] 79 y.o.   MRN: [id number]  CC:  Acute visit due to UTI and breast nodule HPI:  [name] is a 79 y.o.    with past medical history as outlined below, who present for an acute visit today.  1. UTI: Patient reports that she has been having symptoms for UTI full molding 2 weeks. Symptoms include dysuria, burning on urination and increased urinary frequency. She does not have a fever or chills. No nausea or vomiting.  2.Right breast Nodule: Recently patient accidentally noticed a small nodule over her right breast. It is located at the R upper quadrant. The nodule is small and hard. Patient does not have a family history for breast cancer. She used to smoke at a young age, she quit smoking at 52 years ago. She did not loose weight.   3. CHF:  Patient has hx of moderate aortic stenosis with aortic valve area of 0.74 cm2 by 2-D echo on 09/05/12. Her EF was 55-60% by 2-D echo. She is currently taking Lasix 80 mg twice a day. Her body weight has been stable from 273 on 09/15/12 to 273 pounds today. She does not have chest pain, palpitation, worsening shortness of breath. She is using 3 L of oxygen at home. Her shortness of breath is at her baseline.  4. A Fib: she is coumadin. Her INR is 2.1 today. Her heart rate is ~60/min today.  Past Medical History  Diagnosis Date  . Coronary atherosclerosis of native coronary artery     Mild at cateterization 2006  . Abdominal pain     Likely secondary to presbyesophagus and gastric dysmotility  . Chronic diastolic heart failure     June 2014 echo with EF 55-60% without wall motion abd but mod to severe AS  . Respiratory failure, chronic     Mixed etiology with bronchospastic component  . Urinary incontinence     Recurrent uti's/resistance to cipro, bactrim  . Gout   . Postablative hypothyroidism      H/o Graves disease s/p radioactive iodine ablation with resultant postablative hypothyroidism  . Hyperlipidemia   . Depression   . Morbidly obese     s/p gastric plication surgery  . Gastroesophageal reflux disease   . Ventral hernia     Repair in April 2008, complicated by MRSA abdominal wall cellulitis  . DVT (deep venous thrombosis) 1998  . Pulmonary embolism 1998    Greenfield filter  . Diabetes mellitus type 2, controlled, with complications   . Essential hypertension, benign   . AF (atrial fibrillation)     on chronic warfarin  . Osteoarthritis   . Stroke 1997    Denies residual  . Anemia     Blood transfusion [V58.2]  . Supratherapeutic INR 05/15/2012    Possibly induced by steroid use.   . Chest pain at rest 08/07/2012   Current Outpatient Prescriptions  Medication Sig Dispense Refill  . acetaminophen (TYLENOL) 500 MG tablet Take 1,000 mg by mouth 2 (two) times daily as needed. For pain.      Marland Kitchen ADVAIR DISKUS 250-50 MCG/DOSE AEPB INHALE ONE PUFF BY MOUTH EVERY 12 HOURS  60 each  0  . albuterol (PROVENTIL HFA) 108 (90 BASE) MCG/ACT inhaler Inhale 2 puffs into the lungs every 6 (six) hours as needed. For wheezing.  3.7 g  11  . allopurinol (ZYLOPRIM) 100 MG tablet Take 1 tablet (100 mg total) by mouth 2 (two) times daily.  60 tablet  3  . aspirin EC 81 MG EC tablet Take 1 tablet (81 mg total) by mouth daily.      Marland Kitchen atorvastatin (LIPITOR) 40 MG tablet Take 40 mg by mouth daily.      . colchicine 0.6 MG tablet Take 0.6 mg by mouth See admin instructions. Take for 3 days when gout flares up      . FLUoxetine (PROZAC) 20 MG capsule Take 1 capsule (20 mg total) by mouth 2 (two) times daily.  180 capsule  1  . furosemide (LASIX) 80 MG tablet Take 1 tablet (80 mg total) by mouth 2 (two) times daily.  60 tablet  1  . gabapentin (NEURONTIN) 100 MG capsule TAKE TWO CAPSULES BY MOUTH TWICE DAILY  90 capsule  2  . glipiZIDE (GLUCOTROL) 10 MG tablet TAKE ONE TABLET BY MOUTH TWICE DAILY  BEFORE MEALS  60 tablet  4  . levothyroxine (SYNTHROID) 150 MCG tablet Take 1 tablet (150 mcg total) by mouth daily.  30 tablet  11  . loperamide (IMODIUM) 2 MG capsule Take 1 capsule (2 mg total) by mouth as needed for diarrhea or loose stools (up to total 16mg  daily).  8 capsule  0  . LORazepam (ATIVAN) 1 MG tablet Take 1 tablet (1 mg total) by mouth at bedtime as needed for anxiety (for sleep).  30 tablet  2  . montelukast (SINGULAIR) 10 MG tablet Take 1 tablet (10 mg total) by mouth daily.  30 tablet  2  . omeprazole (PRILOSEC) 20 MG capsule Take 40 mg by mouth 2 (two) times daily.      . polyethylene glycol powder (GLYCOLAX/MIRALAX) powder USE AS DIRECTED  527 g  0  . SPIRIVA HANDIHALER 18 MCG inhalation capsule PLACE ONE CAPSULE INTO DEVICE AND INHALE 1 PUFF DAILY  30 capsule  11  . warfarin (COUMADIN) 5 MG tablet Take 5 mg by mouth daily.      . ciprofloxacin (CIPRO) 500 MG tablet Take 1 tablet (500 mg total) by mouth 2 (two) times daily.  14 tablet  0   No current facility-administered medications for this visit.   Family History  Problem Relation Age of Onset  . Colon cancer Neg Hx    History   Social History  . Marital Status: Widowed    Spouse Name: N/A    Number of Children: N/A  . Years of Education: N/A   Social History Main Topics  . Smoking status: Former Smoker -- 1.00 packs/day for 1.5 years    Types: Cigarettes  . Smokeless tobacco: Never Used  . Alcohol Use: No  . Drug Use: No  . Sexual Activity: No   Other Topics Concern  . None   Social History Narrative   She lives w/her son. Her son offers help and she has an aid who occasionally cooks for her and takes care of some other tasks around the pt's house.   Has medicare. Has son in [place] who is POA   Ambivalent about DNR issues and does not want a STOP sign posted in home   Home Health Aid visits 6 days a week             Review of Systems: Full 14-point review of systems otherwise negative. See  HPI.   Objective:  Physical Exam: Filed Vitals:   11/10/12 1447  BP:  143/53  Pulse: 60  Temp: 97.3 F (36.3 C)  TempSrc: Oral  Height: 5' (1.524 m)  Weight: 273 lb 4.8 oz (123.968 kg)  SpO2: 93%   General: obese female in wheelchair, O2 on, NAD HEENT: MMM of OP, anicteric sclerae Cardiac: loud SEM RUSB. Rate 50-60 regular. Pulm: good air movement no wheezes or rales Breast: there is a small hard nodule over RUQ of her R breast. It is approximately 0.6 cm in size. It is hard with clear margin. It is not moveable. No axillary nodes enlargement. Abd: soft, NTND. No CVA tenderness.  Ext: warm and well perfused, trace edema   Assessment & Plan:   Addendum 12/07/12:   Right breast Nodule: Recently patient accidentally noticed a small nodule over her right breast. It is located at the R upper quadrant. The nodule is small and hard. Patient does not have a family history for breast cancer. She used to smoke at a young age, she quit smoking at 52 years ago. She did not loose weight. Patient had mammogram and breast ultrasound done on 12/04/12. Both of tests showed probable benign asymmetric tissue in the medial aspect of the left breast and probable post-traumatic changes in the 12:30 region of the right breast. It is recommended to do short-term interval follow-up left mammogram and right breast ultrasound in 6 months by Dr. Baird Lyons.   -Will need to repeat left mammogram and right breast ultrasound in 6 months.    Lorretta Harp, MD PGY3, Internal Medicine Teaching Service Pager: [phone]

## 2012-11-10 NOTE — Assessment & Plan Note (Signed)
There is a small hard nodule over RUQ of her R breast. It is approximately 0.6 cm in size. It is hard with clear margin. It is not moveable. No axillary nodes enlargement. Patient does not have a family history of breast cancer. She does not have weight loss or other alarming symptoms. Patient used to smoke when she was young, but quit smoking 50 years ago.  -will get diagnostic mammogram. -If confirmed, will get ultrasound evaluation for possible biopsy.

## 2012-11-10 NOTE — Assessment & Plan Note (Signed)
It is stable. Her body weight has been stable since 09/15/12. She does not have worsening shortness of breath. She does not have chest pain and palpitation. Her lung auscultation is clear bilaterally.   -will continue lasix 80 mg daily.

## 2012-11-10 NOTE — Assessment & Plan Note (Signed)
Patient has typical symptoms for UTI. She does not have CVA tenderness. She does not have fever or chills. It is less likely to have pyelonephritis. She is hemodynamically stable. No signs for sepsis.  -Will treat her empirically with ciprofloxacin 500 mg for 7 days for complicated UTI given her hx of DM-II. -will get UA and culture -will check her INR on 9/22 given the drug-drug interaction between ciprofloxacin and Coumadin.

## 2012-11-10 NOTE — Assessment & Plan Note (Signed)
Patient has a bradycardia in the setting of A. fib. She is asymptomatic for bradycardia with heart rate of 60 today. She is currently taking Coumadin. Stat INR is 2.1 today. Expecting slightly increased INR after starting ciprofloxacin.  -Recheck INR on 9/22

## 2012-11-11 ENCOUNTER — Other Ambulatory Visit: Payer: Self-pay | Admitting: Internal Medicine

## 2012-11-11 DIAGNOSIS — N63 Unspecified lump in unspecified breast: Secondary | ICD-10-CM

## 2012-11-11 NOTE — Progress Notes (Signed)
INTERNAL MEDICINE TEACHING ATTENDING ADDENDUM - Jonah Blue, DO, FACP: I reviewed with the resident Lorretta Harp, MD, Kelilah Riordan's medical history, physical examination, diagnosis and results of tests and treatment and I agree with the patient's care as documented.

## 2012-11-16 ENCOUNTER — Ambulatory Visit (INDEPENDENT_AMBULATORY_CARE_PROVIDER_SITE_OTHER): Payer: Medicare PPO | Admitting: Pharmacist

## 2012-11-16 DIAGNOSIS — Z7901 Long term (current) use of anticoagulants: Secondary | ICD-10-CM

## 2012-11-16 NOTE — Patient Instructions (Signed)
Patient instructed to take medications as defined in the Anti-coagulation Track section of this encounter.  Patient instructed to take today's dose.  Patient verbalized understanding of these instructions.    

## 2012-11-16 NOTE — Progress Notes (Signed)
Anti-Coagulation Progress Note  [name] is a 79 y.o. female who is currently on an anti-coagulation regimen.    RECENT RESULTS: Recent results are below, the most recent result is correlated with a dose of 35 mg. per week: Lab Results  Component Value Date   INR 2.00 11/16/2012   INR 2.1 11/10/2012   INR 2.6 09/15/2012    ANTI-COAG DOSE: Anticoagulation Dose Instructions as of 11/16/2012     Glynis Smiles Tue Wed Thu Fri Sat   New Dose 5 mg 5 mg 5 mg 2.5 mg 5 mg 5 mg 5 mg       ANTICOAG SUMMARY: Anticoagulation Episode Summary   Current INR goal 2.0-3.0  Next INR check 11/23/2012  INR from last check 2.00 (11/16/2012)  Weekly max dose   Target end date Indefinite  INR check location   Preferred lab   Send INR reminders to ANTICOAG IMP   Indications  Long-term (current) use of anticoagulants [V58.61] AF (atrial fibrillation) (Resolved) [427.31]        Comments       Anticoagulation Care Providers   Provider Role Specialty Phone number   Ulyess Mort, MD  Internal Medicine [phone]      ANTICOAG TODAY: Anticoagulation Summary as of 11/16/2012   INR goal 2.0-3.0  Selected INR 2.00 (11/16/2012)  Next INR check 11/23/2012  Target end date Indefinite   Indications  Long-term (current) use of anticoagulants [V58.61] AF (atrial fibrillation) (Resolved) [427.31]      Anticoagulation Episode Summary   INR check location    Preferred lab    Send INR reminders to ANTICOAG IMP   Comments     Anticoagulation Care Providers   Provider Role Specialty Phone number   Ulyess Mort, MD  Internal Medicine [phone]      PATIENT INSTRUCTIONS: Patient Instructions  Patient instructed to take medications as defined in the Anti-coagulation Track section of this encounter.  Patient instructed to take today's dose.  Patient verbalized understanding of these instructions.       FOLLOW-UP Return in 7 days (on 11/23/2012) for Follow up INR at 3PM.  Hulen Luster,  III Pharm.D., CACP

## 2012-11-22 ENCOUNTER — Other Ambulatory Visit: Payer: Self-pay | Admitting: Internal Medicine

## 2012-11-23 ENCOUNTER — Ambulatory Visit: Payer: Medicare PPO

## 2012-11-26 ENCOUNTER — Other Ambulatory Visit: Payer: Self-pay | Admitting: Internal Medicine

## 2012-11-27 ENCOUNTER — Other Ambulatory Visit: Payer: Self-pay | Admitting: Internal Medicine

## 2012-12-04 ENCOUNTER — Ambulatory Visit
Admission: RE | Admit: 2012-12-04 | Discharge: 2012-12-04 | Disposition: A | Discharge status: Home / Self Care | Payer: Medicare PPO | Source: Ambulatory Visit | Attending: Internal Medicine | Admitting: Internal Medicine

## 2012-12-04 ENCOUNTER — Other Ambulatory Visit: Payer: Self-pay | Admitting: Internal Medicine

## 2012-12-04 DIAGNOSIS — N63 Unspecified lump in unspecified breast: Secondary | ICD-10-CM

## 2012-12-07 ENCOUNTER — Ambulatory Visit (INDEPENDENT_AMBULATORY_CARE_PROVIDER_SITE_OTHER): Payer: Medicare PPO | Admitting: Pharmacist

## 2012-12-07 DIAGNOSIS — Z7901 Long term (current) use of anticoagulants: Secondary | ICD-10-CM

## 2012-12-07 LAB — POCT INR: INR: 2.6

## 2012-12-07 NOTE — Progress Notes (Signed)
Anti-Coagulation Progress Note  [name] is a 79 y.o. female who is currently on an anti-coagulation regimen.    RECENT RESULTS: Recent results are below, the most recent result is correlated with a dose of 32.5 mg. per week: Lab Results  Component Value Date   INR 2.60 12/07/2012   INR 2.00 11/16/2012   INR 2.1 11/10/2012    ANTI-COAG DOSE: Anticoagulation Dose Instructions as of 12/07/2012     Glynis Smiles Tue Wed Thu Fri Sat   New Dose 5 mg 5 mg 5 mg 2.5 mg 5 mg 5 mg 5 mg       ANTICOAG SUMMARY: Anticoagulation Episode Summary   Current INR goal 2.0-3.0  Next INR check 12/28/2012  INR from last check 2.60 (12/07/2012)  Weekly max dose   Target end date Indefinite  INR check location   Preferred lab   Send INR reminders to ANTICOAG IMP   Indications  Long-term (current) use of anticoagulants [V58.61] AF (atrial fibrillation) (Resolved) [427.31]        Comments       Anticoagulation Care Providers   Provider Role Specialty Phone number   Ulyess Mort, MD  Internal Medicine [phone]      ANTICOAG TODAY: Anticoagulation Summary as of 12/07/2012   INR goal 2.0-3.0  Selected INR 2.60 (12/07/2012)  Next INR check 12/28/2012  Target end date Indefinite   Indications  Long-term (current) use of anticoagulants [V58.61] AF (atrial fibrillation) (Resolved) [427.31]      Anticoagulation Episode Summary   INR check location    Preferred lab    Send INR reminders to ANTICOAG IMP   Comments     Anticoagulation Care Providers   Provider Role Specialty Phone number   Ulyess Mort, MD  Internal Medicine [phone]      PATIENT INSTRUCTIONS: Patient Instructions  Patient instructed to take medications as defined in the Anti-coagulation Track section of this encounter.  Patient instructed to take today's dose.  Patient verbalized understanding of these instructions.       FOLLOW-UP Return in 3 weeks (on 12/28/2012) for Follow up INR at  3:15PM.  Hulen Luster, III Pharm.D., CACP

## 2012-12-07 NOTE — Progress Notes (Signed)
Indication: Atrial fibrillation. Duration: Lifelong. INR: At target. Agree with Dr. Groce's assessment and plan. 

## 2012-12-07 NOTE — Patient Instructions (Signed)
Patient instructed to take medications as defined in the Anti-coagulation Track section of this encounter.  Patient instructed to take today's dose.  Patient verbalized understanding of these instructions.    

## 2012-12-10 ENCOUNTER — Encounter: Payer: Self-pay | Admitting: Internal Medicine

## 2012-12-10 ENCOUNTER — Telehealth: Payer: Self-pay | Admitting: Dietician

## 2012-12-10 ENCOUNTER — Ambulatory Visit (INDEPENDENT_AMBULATORY_CARE_PROVIDER_SITE_OTHER): Payer: Medicare PPO | Admitting: Internal Medicine

## 2012-12-10 VITALS — BP 149/54 | HR 56 | Temp 97.0°F

## 2012-12-10 DIAGNOSIS — L905 Scar conditions and fibrosis of skin: Secondary | ICD-10-CM

## 2012-12-10 DIAGNOSIS — J449 Chronic obstructive pulmonary disease, unspecified: Secondary | ICD-10-CM

## 2012-12-10 DIAGNOSIS — J4489 Other specified chronic obstructive pulmonary disease: Secondary | ICD-10-CM

## 2012-12-10 MED ORDER — IPRATROPIUM BROMIDE HFA 17 MCG/ACT IN AERS: 2.0000 | INHALATION_SPRAY | Freq: Four times a day (QID) | RESPIRATORY_TRACT | Status: DC

## 2012-12-10 MED ORDER — IPRATROPIUM-ALBUTEROL 18-103 MCG/ACT IN AERO: 2.0000 | INHALATION_SPRAY | Freq: Four times a day (QID) | RESPIRATORY_TRACT | Status: DC | PRN

## 2012-12-10 NOTE — Patient Instructions (Addendum)
Please start using Combivent inhaler for your shortness of breath  You may use up to 2 puff twice daily of the Advair if you are more short of breath  Please use Advair every day. It is important that you take very consistently  Please follow up her as needed for if symptoms persist.

## 2012-12-11 DIAGNOSIS — R238 Other skin changes: Secondary | ICD-10-CM | POA: Insufficient documentation

## 2012-12-11 NOTE — Assessment & Plan Note (Signed)
No active bleeding or infection.  1. Will apply dressing on open areas. 2. Encouraged patient to avoid scratching since she can easily bleed (on Coumadin therapy).  3. Continue with Benadryl nightly.  4. Will consider referral to wound care if this continues to be a problem.

## 2012-12-11 NOTE — Assessment & Plan Note (Signed)
Symptoms and physical exam do not suggest COPD exacerbation. Poor inhaler technique cannot be excluded. Not fluid overloaded on physical exam.  Plan. - Start Combivent inhaler as rescue strategy for shortness of breath. - Discontinue albuterol. - consider trial of nebulizer since it might be easier to use. - Continue with Spiriva. - Encourage her to increase her Advair to 2 puffs twice a day on bad days otherwise to use 1 puff twice a day on regular days. - consider enrollment into GOLD program - will f/u prn

## 2012-12-11 NOTE — Progress Notes (Signed)
Patient ID: [name], female   DOB: 06-02-1932, 79 y.o.   MRN: [id number]   Subjective:   HPI: [name] is a 79 y.o. woman with past medical history of COPD on home oxygen, CHF, atrial fibrillation on oral Coumadin, and a myriad of other medical issues presents to the clinic with increased shortness of breath and inability to sleep over the last 3 days.  She reports that over the several 3 nights has been more short of breath without increased cough, wheezing, or sputum production. She denies fevers, chills, rigors, increased fatigue, or any other constitutional symptoms. She reports good compliance with the inhalers including Advair (1 puff twice daily), Spiriva (1 cap once daily), and albuterol as needed for shortness of breath. She does not have a nebulizer. She had her Spiriva today in the morning and she last used her Advair 250-50 before clinic visit. She tried to her albuterol inhaler several times today a.m without relief of symptoms. On a good day, patient is only able to move from room to room. She is unable to get out of the house. She has a Engineer, civil (consulting), who goes to house everyday to help her with medications and house chores. She lives with her son, who works all day. I can't check her inhaler technique, because she does not have her inhalers.   She doesn't need any refills of her medications.  Past Medical History  Diagnosis Date  . Coronary atherosclerosis of native coronary artery     Mild at cateterization 2006  . Abdominal pain     Likely secondary to presbyesophagus and gastric dysmotility  . Chronic diastolic heart failure     June 2014 echo with EF 55-60% without wall motion abd but mod to severe AS  . Respiratory failure, chronic     Mixed etiology with bronchospastic component  . Urinary incontinence     Recurrent uti's/resistance to cipro, bactrim  . Gout   . Postablative hypothyroidism     H/o Graves disease s/p radioactive iodine ablation with resultant  postablative hypothyroidism  . Hyperlipidemia   . Depression   . Morbidly obese     s/p gastric plication surgery  . Gastroesophageal reflux disease   . Ventral hernia     Repair in April 2008, complicated by MRSA abdominal wall cellulitis  . DVT (deep venous thrombosis) 1998  . Pulmonary embolism 1998    Greenfield filter  . Diabetes mellitus type 2, controlled, with complications   . Essential hypertension, benign   . AF (atrial fibrillation)     on chronic warfarin  . Osteoarthritis   . Stroke 1997    Denies residual  . Anemia     Blood transfusion [V58.2]  . Supratherapeutic INR 05/15/2012    Possibly induced by steroid use.   . Chest pain at rest 08/07/2012   Current Outpatient Prescriptions  Medication Sig Dispense Refill  . acetaminophen (TYLENOL) 500 MG tablet Take 1,000 mg by mouth 2 (two) times daily as needed. For pain.      Marland Kitchen ADVAIR DISKUS 250-50 MCG/DOSE AEPB INHALE ONE PUFFS BY MOUTH EVERY 12 HOURS  60 each  0  . albuterol-ipratropium (COMBIVENT) 18-103 MCG/ACT inhaler Inhale 2 puffs into the lungs every 6 (six) hours as needed for wheezing or shortness of breath.  2 Inhaler  0  . allopurinol (ZYLOPRIM) 100 MG tablet Take 1 tablet (100 mg total) by mouth 2 (two) times daily.  60 tablet  3  . aspirin EC 81  MG EC tablet Take 1 tablet (81 mg total) by mouth daily.      Marland Kitchen atorvastatin (LIPITOR) 40 MG tablet Take 40 mg by mouth daily.      . colchicine 0.6 MG tablet Take 0.6 mg by mouth See admin instructions. Take for 3 days when gout flares up      . FLUoxetine (PROZAC) 20 MG capsule Take 1 capsule (20 mg total) by mouth 2 (two) times daily.  180 capsule  1  . furosemide (LASIX) 80 MG tablet Take 1 tablet (80 mg total) by mouth 2 (two) times daily.  60 tablet  1  . gabapentin (NEURONTIN) 100 MG capsule TAKE TWO CAPSULES BY MOUTH TWICE DAILY  90 capsule  2  . glipiZIDE (GLUCOTROL) 10 MG tablet TAKE ONE TABLET BY MOUTH TWICE DAILY BEFORE MEALS  60 tablet  4  .  levothyroxine (SYNTHROID) 150 MCG tablet Take 1 tablet (150 mcg total) by mouth daily.  30 tablet  11  . loperamide (IMODIUM) 2 MG capsule Take 1 capsule (2 mg total) by mouth as needed for diarrhea or loose stools (up to total 16mg  daily).  8 capsule  0  . LORazepam (ATIVAN) 1 MG tablet Take 1 tablet (1 mg total) by mouth at bedtime as needed for anxiety (for sleep).  30 tablet  2  . montelukast (SINGULAIR) 10 MG tablet Take 1 tablet (10 mg total) by mouth daily.  30 tablet  2  . omeprazole (PRILOSEC) 20 MG capsule TAKE TWO CAPSULES BY MOUTH EVERY DAY  60 capsule  0  . polyethylene glycol powder (GLYCOLAX/MIRALAX) powder USE AS DIRECTED  527 g  0  . SPIRIVA HANDIHALER 18 MCG inhalation capsule PLACE ONE CAPSULE INTO DEVICE AND INHALE 1 PUFF DAILY  30 capsule  11  . warfarin (COUMADIN) 5 MG tablet Take 5 mg by mouth daily.       No current facility-administered medications for this visit.   Family History  Problem Relation Age of Onset  . Colon cancer Neg Hx    History   Social History  . Marital Status: Widowed    Spouse Name: N/A    Number of Children: N/A  . Years of Education: N/A   Social History Main Topics  . Smoking status: Former Smoker -- 1.00 packs/day for 1.5 years    Types: Cigarettes  . Smokeless tobacco: Never Used  . Alcohol Use: No  . Drug Use: No  . Sexual Activity: No   Other Topics Concern  . None   Social History Narrative   She lives w/her son. Her son offers help and she has an aid who occasionally cooks for her and takes care of some other tasks around the pt's house.   Has medicare. Has son in [place] who is POA   Ambivalent about DNR issues and does not want a STOP sign posted in home   Home Health Aid visits 6 days a week            Review of Systems: Constitutional: Denies fever, chills, diaphoresis, appetite change and fatigue.  Cardiovascular: Her edema is unchanged. No chest pain, palpitations. She does not check her wt as  home. Gastrointestinal: No abdominal pain, nausea, vomiting, bloody stools Genitourinary: No dysuria, frequency, hematuria, or flank pain.  Skin: she reports increased scar itching over her abdomen. No bleeding. She takes benadryl nightly for itching.  Musculoskeletal: No myalgias, back pain, joint swelling, arthralgias  Psych: No depression symptoms. No SI or SA.  Objective:  Physical Exam: Filed Vitals:   12/10/12 1447  BP: 149/54  Pulse: 56  Temp: 97 F (36.1 C)  TempSrc: Oral  SpO2: 93%   General: Morbidly obese. No acute distress. Breaks her speech occasionally to take a few breaths before continuing. On 3 L of oxygen by nasal cannula. HEENT: Normal oral mucosa. MMM.  Lungs: Mild widespread bilateral wheezing. No rhonchi. Otherwise, good air movement. Heart: RRR; no extra sounds or murmurs. No jugulovenous distention.  Abdomen: Midline, supraumbilical scar with 2 spots of open wounds measuring ~ 3cm in diameter. The wounds look clean without active bleeding. No suspicion of infection. She says it is itchy and she rubs it frequently. Otherwise, the rest of the scar is well-healed. Non-distended, normal BS, soft, nontender; no hepatosplenomegaly  Extremities: No pedal edema. No joint swelling or tenderness. Neurologic: Normal EOM,  Alert and oriented x3. No obvious neurologic/cranial nerve deficits.  Assessment & Plan:  I have discussed my assessment and plan  with  my attending in the clinic, Dr. Rogelia Boga  as detailed under problem based charting.

## 2012-12-11 NOTE — Telephone Encounter (Signed)
10-08-12 report requested.

## 2012-12-13 ENCOUNTER — Other Ambulatory Visit: Payer: Self-pay | Admitting: Internal Medicine

## 2012-12-13 DIAGNOSIS — E1142 Type 2 diabetes mellitus with diabetic polyneuropathy: Secondary | ICD-10-CM

## 2012-12-13 DIAGNOSIS — I350 Nonrheumatic aortic (valve) stenosis: Secondary | ICD-10-CM

## 2012-12-13 DIAGNOSIS — I5032 Chronic diastolic (congestive) heart failure: Secondary | ICD-10-CM

## 2012-12-13 DIAGNOSIS — I10 Essential (primary) hypertension: Secondary | ICD-10-CM

## 2012-12-13 DIAGNOSIS — Z7901 Long term (current) use of anticoagulants: Secondary | ICD-10-CM

## 2012-12-13 DIAGNOSIS — I4891 Unspecified atrial fibrillation: Secondary | ICD-10-CM

## 2012-12-13 MED ORDER — WARFARIN SODIUM 5 MG PO TABS: ORAL_TABLET | ORAL | Status: DC

## 2012-12-13 NOTE — Telephone Encounter (Signed)
   Reason for call:   I received a call from Almira Coaster for Ms. Murdis L Rhymes at 4:03 PM indicating that Ms. Wahab has run out of her coumadin and has no refills prescribed.  He says her last dose was Friday night 12/11/12 and he sent a request two weeks ago to pcp and it has not been filled.  Therefore, she did not take any coumadin yesterday and she has not had it today either.   Pertinent Data:   Last visit with Dr. Alexandria Lodge 12/07/12, INR at that visit was 2.6.  Coumadin dose was set at 5mg  every day except 2.5mg  on Wednesday.   Last office visit with Dr. Zada Girt 12/10/12 for increased SOB and inability to sleep  I called her pharmacy ([facility]), last coumadin was filled 11/09/12 with no more refills.   He reports she has no complaints of bleeding at this time, but does say she apparently had a nosebleed sometime last week which he said was addressed during last office visit.   I also then called the house phone and spoke to Ms. Tullo herself who confirmed that she has missed her dose today and last dose was on Friday. She was able to correctly tell me her last visit with Dr. Alexandria Lodge and her INR that day. She denies any bleeding this week and says she did have a nose bleed last week but was told to continue coumadin.    Assessment / Plan / Recommendations:   Ms. Behrmann is a 79 year old female with PMH of COPD, Afib and PE on chronic coumadin, HTN, and morbid obesity.  Her son [name] called the on-call pager today requesting refill of her coumadin that she has run out of and refill was not done on prior request.   I discussed with the inpatient pharmacist at [facility] as well, and they recommended to resume prior prescribed dosing per Dr. Alexandria Lodge this week, 5mg  po daily except 2.5 mg on wed and to return to coumadin clinic for INR and dose check hopefully Monday 12/14/12. I discussed this recommendation with both [name] and Ms. Falls over the phone and both were  able to repeat instructions back to me voicing understanding.   I will send a message to the front desk to have Ms. Konkel contacted and seen in coumadin clinic if needed on Monday. Mr. Natale Milch said he can bring her there on Monday and will do so.  This note will also be routed to PCP, front desk, and Dr. Alexandria Lodge  As always, pt is advised that if symptoms worsen or new symptoms arise such as bleeding, they should go to an urgent care facility or to to ER for further evaluation.   Darden Palmer, MD   12/13/2012, 4:18 PM

## 2012-12-14 ENCOUNTER — Other Ambulatory Visit (INDEPENDENT_AMBULATORY_CARE_PROVIDER_SITE_OTHER): Payer: Medicare PPO

## 2012-12-14 DIAGNOSIS — I5032 Chronic diastolic (congestive) heart failure: Secondary | ICD-10-CM

## 2012-12-14 DIAGNOSIS — I10 Essential (primary) hypertension: Secondary | ICD-10-CM

## 2012-12-14 DIAGNOSIS — E1142 Type 2 diabetes mellitus with diabetic polyneuropathy: Secondary | ICD-10-CM

## 2012-12-14 DIAGNOSIS — I350 Nonrheumatic aortic (valve) stenosis: Secondary | ICD-10-CM

## 2012-12-14 MED ORDER — FUROSEMIDE 80 MG PO TABS: 80.0000 mg | ORAL_TABLET | Freq: Two times a day (BID) | ORAL | Status: DC

## 2012-12-14 NOTE — Addendum Note (Signed)
Addended by: Aletta Edouard on: 12/14/2012 09:26 AM   Modules accepted: Orders

## 2012-12-14 NOTE — Progress Notes (Signed)
I saw and evaluated the patient.  I personally confirmed the key portions of the history and exam documented by Dr. Kazibwe and I reviewed pertinent patient test results.  The assessment, diagnosis, and plan were formulated together and I agree with the documentation in the resident's note. 

## 2012-12-14 NOTE — Telephone Encounter (Signed)
Pt was informed of Lasix rx per Dr Dalphine Handing. Message sent to front office to schedule pt a lab appt.

## 2012-12-14 NOTE — Telephone Encounter (Addendum)
I am going to reorder lasix for [name], keeping in mind her moderate to severe Aortic Stenosis, and the fact that last time she decreased her lasix dose, she had to be admitted for CHF exacerbation. However, keeping in mind her rising creatinine, I would advise a lab visit for BMP check soon. I am putting in future orders.

## 2012-12-15 LAB — BASIC METABOLIC PANEL WITH GFR
BUN: 35 mg/dL — ABNORMAL HIGH (ref 6–23)
CO2: 38 mEq/L — ABNORMAL HIGH (ref 19–32)
Calcium: 10.4 mg/dL (ref 8.4–10.5)
Creat: 1.16 mg/dL — ABNORMAL HIGH (ref 0.50–1.10)
GFR, Est African American: 52 mL/min — ABNORMAL LOW
Glucose, Bld: 115 mg/dL — ABNORMAL HIGH (ref 70–99)

## 2012-12-15 NOTE — Progress Notes (Signed)
Thanks Samaya. She has appt with Dr Alexandria Lodge on 11/3. PCP will refill.

## 2012-12-22 NOTE — Addendum Note (Signed)
Addended by: Remus Blake on: 12/22/2012 11:16 AM   Modules accepted: Orders

## 2012-12-22 NOTE — Addendum Note (Signed)
Addended by: Remus Blake on: 12/22/2012 02:48 PM   Modules accepted: Orders

## 2012-12-25 ENCOUNTER — Other Ambulatory Visit: Payer: Self-pay | Admitting: *Deleted

## 2012-12-26 ENCOUNTER — Other Ambulatory Visit: Payer: Self-pay | Admitting: Internal Medicine

## 2012-12-28 ENCOUNTER — Inpatient Hospital Stay (HOSPITAL_COMMUNITY)
Admission: EM | Admit: 2012-12-28 | Discharge: 2013-01-01 | DRG: 193 | Disposition: A | Discharge status: Home Health | Payer: Medicare PPO | Attending: Internal Medicine | Admitting: Internal Medicine

## 2012-12-28 ENCOUNTER — Inpatient Hospital Stay (HOSPITAL_COMMUNITY): Payer: Medicare PPO

## 2012-12-28 ENCOUNTER — Emergency Department (HOSPITAL_COMMUNITY): Payer: Medicare PPO

## 2012-12-28 ENCOUNTER — Ambulatory Visit: Payer: Medicare PPO

## 2012-12-28 ENCOUNTER — Encounter (HOSPITAL_COMMUNITY): Payer: Self-pay | Admitting: Emergency Medicine

## 2012-12-28 DIAGNOSIS — G9340 Encephalopathy, unspecified: Secondary | ICD-10-CM | POA: Diagnosis present

## 2012-12-28 DIAGNOSIS — F329 Major depressive disorder, single episode, unspecified: Secondary | ICD-10-CM | POA: Diagnosis present

## 2012-12-28 DIAGNOSIS — Z87891 Personal history of nicotine dependence: Secondary | ICD-10-CM

## 2012-12-28 DIAGNOSIS — F3289 Other specified depressive episodes: Secondary | ICD-10-CM | POA: Diagnosis present

## 2012-12-28 DIAGNOSIS — Z7901 Long term (current) use of anticoagulants: Secondary | ICD-10-CM

## 2012-12-28 DIAGNOSIS — I2789 Other specified pulmonary heart diseases: Secondary | ICD-10-CM | POA: Diagnosis present

## 2012-12-28 DIAGNOSIS — T380X5A Adverse effect of glucocorticoids and synthetic analogues, initial encounter: Secondary | ICD-10-CM | POA: Diagnosis not present

## 2012-12-28 DIAGNOSIS — E662 Morbid (severe) obesity with alveolar hypoventilation: Secondary | ICD-10-CM | POA: Diagnosis present

## 2012-12-28 DIAGNOSIS — I350 Nonrheumatic aortic (valve) stenosis: Secondary | ICD-10-CM

## 2012-12-28 DIAGNOSIS — E1149 Type 2 diabetes mellitus with other diabetic neurological complication: Secondary | ICD-10-CM | POA: Diagnosis present

## 2012-12-28 DIAGNOSIS — J9620 Acute and chronic respiratory failure, unspecified whether with hypoxia or hypercapnia: Secondary | ICD-10-CM | POA: Diagnosis present

## 2012-12-28 DIAGNOSIS — I5032 Chronic diastolic (congestive) heart failure: Secondary | ICD-10-CM | POA: Diagnosis present

## 2012-12-28 DIAGNOSIS — E785 Hyperlipidemia, unspecified: Secondary | ICD-10-CM | POA: Diagnosis present

## 2012-12-28 DIAGNOSIS — N183 Chronic kidney disease, stage 3 unspecified: Secondary | ICD-10-CM | POA: Diagnosis present

## 2012-12-28 DIAGNOSIS — E119 Type 2 diabetes mellitus without complications: Secondary | ICD-10-CM | POA: Diagnosis present

## 2012-12-28 DIAGNOSIS — R0602 Shortness of breath: Secondary | ICD-10-CM

## 2012-12-28 DIAGNOSIS — D649 Anemia, unspecified: Secondary | ICD-10-CM | POA: Diagnosis present

## 2012-12-28 DIAGNOSIS — Z86718 Personal history of other venous thrombosis and embolism: Secondary | ICD-10-CM

## 2012-12-28 DIAGNOSIS — Z7982 Long term (current) use of aspirin: Secondary | ICD-10-CM

## 2012-12-28 DIAGNOSIS — J441 Chronic obstructive pulmonary disease with (acute) exacerbation: Secondary | ICD-10-CM | POA: Diagnosis present

## 2012-12-28 DIAGNOSIS — I2510 Atherosclerotic heart disease of native coronary artery without angina pectoris: Secondary | ICD-10-CM | POA: Diagnosis present

## 2012-12-28 DIAGNOSIS — I495 Sick sinus syndrome: Secondary | ICD-10-CM | POA: Diagnosis present

## 2012-12-28 DIAGNOSIS — I509 Heart failure, unspecified: Secondary | ICD-10-CM | POA: Diagnosis present

## 2012-12-28 DIAGNOSIS — M109 Gout, unspecified: Secondary | ICD-10-CM | POA: Diagnosis present

## 2012-12-28 DIAGNOSIS — I129 Hypertensive chronic kidney disease with stage 1 through stage 4 chronic kidney disease, or unspecified chronic kidney disease: Secondary | ICD-10-CM | POA: Diagnosis present

## 2012-12-28 DIAGNOSIS — J9610 Chronic respiratory failure, unspecified whether with hypoxia or hypercapnia: Secondary | ICD-10-CM

## 2012-12-28 DIAGNOSIS — J449 Chronic obstructive pulmonary disease, unspecified: Secondary | ICD-10-CM

## 2012-12-28 DIAGNOSIS — Z86711 Personal history of pulmonary embolism: Secondary | ICD-10-CM

## 2012-12-28 DIAGNOSIS — Z6841 Body Mass Index (BMI) 40.0 and over, adult: Secondary | ICD-10-CM

## 2012-12-28 DIAGNOSIS — J189 Pneumonia, unspecified organism: Principal | ICD-10-CM | POA: Diagnosis present

## 2012-12-28 DIAGNOSIS — Z8673 Personal history of transient ischemic attack (TIA), and cerebral infarction without residual deficits: Secondary | ICD-10-CM

## 2012-12-28 DIAGNOSIS — M1990 Unspecified osteoarthritis, unspecified site: Secondary | ICD-10-CM | POA: Diagnosis present

## 2012-12-28 DIAGNOSIS — I10 Essential (primary) hypertension: Secondary | ICD-10-CM | POA: Diagnosis present

## 2012-12-28 DIAGNOSIS — I160 Hypertensive urgency: Secondary | ICD-10-CM | POA: Diagnosis present

## 2012-12-28 DIAGNOSIS — Z79899 Other long term (current) drug therapy: Secondary | ICD-10-CM

## 2012-12-28 DIAGNOSIS — I359 Nonrheumatic aortic valve disorder, unspecified: Secondary | ICD-10-CM | POA: Diagnosis present

## 2012-12-28 DIAGNOSIS — K219 Gastro-esophageal reflux disease without esophagitis: Secondary | ICD-10-CM | POA: Diagnosis present

## 2012-12-28 DIAGNOSIS — E039 Hypothyroidism, unspecified: Secondary | ICD-10-CM | POA: Diagnosis present

## 2012-12-28 DIAGNOSIS — Z9884 Bariatric surgery status: Secondary | ICD-10-CM

## 2012-12-28 DIAGNOSIS — I5033 Acute on chronic diastolic (congestive) heart failure: Secondary | ICD-10-CM

## 2012-12-28 DIAGNOSIS — E890 Postprocedural hypothyroidism: Secondary | ICD-10-CM | POA: Diagnosis present

## 2012-12-28 DIAGNOSIS — E1142 Type 2 diabetes mellitus with diabetic polyneuropathy: Secondary | ICD-10-CM | POA: Diagnosis present

## 2012-12-28 DIAGNOSIS — J4489 Other specified chronic obstructive pulmonary disease: Secondary | ICD-10-CM | POA: Diagnosis present

## 2012-12-28 DIAGNOSIS — I4891 Unspecified atrial fibrillation: Secondary | ICD-10-CM

## 2012-12-28 LAB — BASIC METABOLIC PANEL
BUN: 34 mg/dL — ABNORMAL HIGH (ref 6–23)
CO2: 33 mEq/L — ABNORMAL HIGH (ref 19–32)
Calcium: 10.1 mg/dL (ref 8.4–10.5)
Calcium: 10 mg/dL (ref 8.4–10.5)
Chloride: 95 mEq/L — ABNORMAL LOW (ref 96–112)
Creatinine, Ser: 1.05 mg/dL (ref 0.50–1.10)
Creatinine, Ser: 1.1 mg/dL (ref 0.50–1.10)
Creatinine, Ser: 1.12 mg/dL — ABNORMAL HIGH (ref 0.50–1.10)
GFR calc Af Amer: 57 mL/min — ABNORMAL LOW (ref >90)
GFR calc Af Amer: 53 mL/min — ABNORMAL LOW (ref >90)
GFR calc non Af Amer: 50 mL/min — ABNORMAL LOW (ref >90)
Glucose, Bld: 135 mg/dL — ABNORMAL HIGH (ref 70–99)
Glucose, Bld: 294 mg/dL — ABNORMAL HIGH (ref 70–99)
Potassium: 3.8 mEq/L (ref 3.5–5.1)
Potassium: 4.2 mEq/L (ref 3.5–5.1)
Sodium: 138 mEq/L (ref 135–145)

## 2012-12-28 LAB — PROTIME-INR: Prothrombin Time: 18 seconds — ABNORMAL HIGH (ref 11.6–15.2)

## 2012-12-28 LAB — BLOOD GAS, ARTERIAL
Bicarbonate: 33.9 mEq/L — ABNORMAL HIGH (ref 20.0–24.0)
O2 Saturation: 95.7 %
Patient temperature: 98.6
TCO2: 35.6 mmol/L (ref 0–100)
pCO2 arterial: 57.2 mmHg (ref 35.0–45.0)

## 2012-12-28 LAB — PRO B NATRIURETIC PEPTIDE: Pro B Natriuretic peptide (BNP): 7922 pg/mL — ABNORMAL HIGH (ref 0–450)

## 2012-12-28 LAB — MAGNESIUM
Magnesium: 2.1 mg/dL (ref 1.5–2.5)
Magnesium: 2.1 mg/dL (ref 1.5–2.5)

## 2012-12-28 LAB — HEPATIC FUNCTION PANEL
ALT: 17 U/L (ref 0–35)
AST: 28 U/L (ref 0–37)
Alkaline Phosphatase: 120 U/L — ABNORMAL HIGH (ref 39–117)
Indirect Bilirubin: 0.3 mg/dL (ref 0.3–0.9)
Total Protein: 7.4 g/dL (ref 6.0–8.3)

## 2012-12-28 LAB — CBC
MCH: 23.5 pg — ABNORMAL LOW (ref 26.0–34.0)
MCHC: 29.1 g/dL — ABNORMAL LOW (ref 30.0–36.0)
MCV: 80.8 fL (ref 78.0–100.0)
Platelets: 206 10*3/uL (ref 150–400)
RBC: 4.17 MIL/uL (ref 3.87–5.11)
RDW: 18.7 % — ABNORMAL HIGH (ref 11.5–15.5)
WBC: 8.9 10*3/uL (ref 4.0–10.5)

## 2012-12-28 LAB — HIV ANTIBODY (ROUTINE TESTING W REFLEX): HIV: NONREACTIVE

## 2012-12-28 LAB — AMMONIA: Ammonia: <10 umol/L — ABNORMAL LOW (ref 11–60)

## 2012-12-28 LAB — MRSA PCR SCREENING: MRSA by PCR: NEGATIVE

## 2012-12-28 LAB — GLUCOSE, CAPILLARY

## 2012-12-28 LAB — STREP PNEUMONIAE URINARY ANTIGEN: Strep Pneumo Urinary Antigen: NEGATIVE

## 2012-12-28 LAB — TROPONIN I: Troponin I: <0.3 ng/mL (ref <0.30)

## 2012-12-28 MED ORDER — ALBUTEROL SULFATE (5 MG/ML) 0.5% IN NEBU: 2.5000 mg | INHALATION_SOLUTION | RESPIRATORY_TRACT | Status: DC | PRN

## 2012-12-28 MED ORDER — IPRATROPIUM BROMIDE 0.02 % IN SOLN
0.5000 mg | RESPIRATORY_TRACT | Status: DC
  Administered 2012-12-28 (×2): 0.5 mg via RESPIRATORY_TRACT
  Filled 2012-12-28 (×3): qty 2.5

## 2012-12-28 MED ORDER — COLCHICINE 0.6 MG PO TABS
0.6000 mg | ORAL_TABLET | Freq: Once | ORAL | Status: AC
  Administered 2012-12-28: 0.6 mg via ORAL
  Filled 2012-12-28: qty 1

## 2012-12-28 MED ORDER — INSULIN ASPART 100 UNIT/ML ~~LOC~~ SOLN
0.0000 [IU] | Freq: Three times a day (TID) | SUBCUTANEOUS | Status: DC
  Administered 2012-12-28 (×3): 8 [IU] via SUBCUTANEOUS
  Administered 2012-12-29: 11 [IU] via SUBCUTANEOUS
  Administered 2012-12-29: 5 [IU] via SUBCUTANEOUS
  Administered 2012-12-29 – 2012-12-30 (×3): 8 [IU] via SUBCUTANEOUS
  Administered 2012-12-30: 18:00:00 5 [IU] via SUBCUTANEOUS
  Administered 2012-12-31: 14:00:00 8 [IU] via SUBCUTANEOUS
  Administered 2012-12-31: 18:00:00 11 [IU] via SUBCUTANEOUS
  Administered 2012-12-31: 3 [IU] via SUBCUTANEOUS
  Administered 2013-01-01: 13:00:00 8 [IU] via SUBCUTANEOUS
  Administered 2013-01-01: 2 [IU] via SUBCUTANEOUS

## 2012-12-28 MED ORDER — IPRATROPIUM BROMIDE 0.02 % IN SOLN: 0.5000 mg | Freq: Three times a day (TID) | RESPIRATORY_TRACT | Status: DC

## 2012-12-28 MED ORDER — FUROSEMIDE 10 MG/ML IJ SOLN
100.0000 mg | INTRAVENOUS | Status: AC
  Administered 2012-12-28: 100 mg via INTRAVENOUS
  Filled 2012-12-28: qty 10

## 2012-12-28 MED ORDER — ALBUTEROL (5 MG/ML) CONTINUOUS INHALATION SOLN
INHALATION_SOLUTION | RESPIRATORY_TRACT | Status: AC
  Administered 2012-12-28: 10 mg/h via RESPIRATORY_TRACT
  Filled 2012-12-28: qty 20

## 2012-12-28 MED ORDER — ALBUTEROL SULFATE (5 MG/ML) 0.5% IN NEBU: 2.5000 mg | INHALATION_SOLUTION | RESPIRATORY_TRACT | Status: DC

## 2012-12-28 MED ORDER — DEXTROSE 5 % IV SOLN
1.0000 g | Freq: Once | INTRAVENOUS | Status: AC
  Administered 2012-12-28: 1 g via INTRAVENOUS
  Filled 2012-12-28: qty 10

## 2012-12-28 MED ORDER — ALBUTEROL (5 MG/ML) CONTINUOUS INHALATION SOLN
10.0000 mg/h | INHALATION_SOLUTION | Freq: Once | RESPIRATORY_TRACT | Status: AC
  Administered 2012-12-28: 10 mg/h via RESPIRATORY_TRACT

## 2012-12-28 MED ORDER — ACETAMINOPHEN 500 MG PO TABS
1000.0000 mg | ORAL_TABLET | Freq: Two times a day (BID) | ORAL | Status: DC | PRN
  Administered 2013-01-01: 1000 mg via ORAL
  Filled 2012-12-28: qty 2

## 2012-12-28 MED ORDER — IPRATROPIUM BROMIDE 0.02 % IN SOLN
RESPIRATORY_TRACT | Status: AC
  Administered 2012-12-28: 0.5 mg via RESPIRATORY_TRACT
  Filled 2012-12-28: qty 2.5

## 2012-12-28 MED ORDER — ALLOPURINOL 100 MG PO TABS
100.0000 mg | ORAL_TABLET | Freq: Two times a day (BID) | ORAL | Status: DC
  Administered 2012-12-28 – 2013-01-01 (×9): 100 mg via ORAL
  Filled 2012-12-28 (×10): qty 1

## 2012-12-28 MED ORDER — ALBUTEROL SULFATE (5 MG/ML) 0.5% IN NEBU
2.5000 mg | INHALATION_SOLUTION | Freq: Three times a day (TID) | RESPIRATORY_TRACT | Status: DC
  Administered 2012-12-29 – 2013-01-01 (×10): 2.5 mg via RESPIRATORY_TRACT
  Filled 2012-12-28 (×10): qty 0.5

## 2012-12-28 MED ORDER — IPRATROPIUM BROMIDE 0.02 % IN SOLN
0.5000 mg | Freq: Once | RESPIRATORY_TRACT | Status: AC
  Administered 2012-12-28: 0.5 mg via RESPIRATORY_TRACT

## 2012-12-28 MED ORDER — POLYETHYLENE GLYCOL 3350 17 GM/SCOOP PO POWD: 17.0000 g | Freq: Every day | ORAL | Status: DC | PRN

## 2012-12-28 MED ORDER — IPRATROPIUM BROMIDE 0.02 % IN SOLN
0.5000 mg | Freq: Three times a day (TID) | RESPIRATORY_TRACT | Status: DC
  Administered 2012-12-29 – 2013-01-01 (×10): 0.5 mg via RESPIRATORY_TRACT
  Filled 2012-12-28 (×10): qty 2.5

## 2012-12-28 MED ORDER — DEXTROSE 5 % IV SOLN
1.0000 g | INTRAVENOUS | Status: DC
  Administered 2012-12-28 – 2012-12-29 (×2): 1 g via INTRAVENOUS
  Filled 2012-12-28 (×2): qty 10

## 2012-12-28 MED ORDER — TIOTROPIUM BROMIDE MONOHYDRATE 18 MCG IN CAPS
18.0000 ug | ORAL_CAPSULE | Freq: Every day | RESPIRATORY_TRACT | Status: DC
  Filled 2012-12-28: qty 5

## 2012-12-28 MED ORDER — IPRATROPIUM-ALBUTEROL 20-100 MCG/ACT IN AERS
1.0000 | INHALATION_SPRAY | Freq: Four times a day (QID) | RESPIRATORY_TRACT | Status: DC | PRN
  Filled 2012-12-28: qty 4

## 2012-12-28 MED ORDER — COLCHICINE 0.6 MG PO TABS
1.2000 mg | ORAL_TABLET | Freq: Once | ORAL | Status: AC
  Administered 2012-12-28: 1.2 mg via ORAL
  Filled 2012-12-28: qty 2

## 2012-12-28 MED ORDER — DEXTROSE 5 % IV SOLN
500.0000 mg | Freq: Once | INTRAVENOUS | Status: AC
  Administered 2012-12-28: 500 mg via INTRAVENOUS

## 2012-12-28 MED ORDER — FUROSEMIDE 80 MG PO TABS
80.0000 mg | ORAL_TABLET | Freq: Two times a day (BID) | ORAL | Status: DC
  Filled 2012-12-28 (×3): qty 1

## 2012-12-28 MED ORDER — SODIUM CHLORIDE 0.9 % IJ SOLN
3.0000 mL | Freq: Two times a day (BID) | INTRAMUSCULAR | Status: DC
  Administered 2012-12-28 – 2013-01-01 (×8): 3 mL via INTRAVENOUS

## 2012-12-28 MED ORDER — MONTELUKAST SODIUM 10 MG PO TABS
10.0000 mg | ORAL_TABLET | Freq: Every day | ORAL | Status: DC
  Administered 2012-12-28 – 2013-01-01 (×5): 10 mg via ORAL
  Filled 2012-12-28 (×5): qty 1

## 2012-12-28 MED ORDER — GABAPENTIN 100 MG PO CAPS
100.0000 mg | ORAL_CAPSULE | Freq: Two times a day (BID) | ORAL | Status: DC
  Filled 2012-12-28 (×2): qty 1

## 2012-12-28 MED ORDER — PANTOPRAZOLE SODIUM 40 MG PO TBEC
40.0000 mg | DELAYED_RELEASE_TABLET | Freq: Every day | ORAL | Status: DC
  Administered 2012-12-28 – 2013-01-01 (×5): 40 mg via ORAL
  Filled 2012-12-28 (×5): qty 1

## 2012-12-28 MED ORDER — LORAZEPAM 0.5 MG PO TABS: 1.0000 mg | ORAL_TABLET | Freq: Every evening | ORAL | Status: DC | PRN

## 2012-12-28 MED ORDER — WARFARIN - PHARMACIST DOSING INPATIENT
Freq: Every day | Status: DC
  Administered 2012-12-29 – 2012-12-31 (×2)

## 2012-12-28 MED ORDER — ALBUTEROL SULFATE (5 MG/ML) 0.5% IN NEBU
2.5000 mg | INHALATION_SOLUTION | RESPIRATORY_TRACT | Status: DC
  Administered 2012-12-28 (×2): 2.5 mg via RESPIRATORY_TRACT
  Filled 2012-12-28 (×3): qty 0.5

## 2012-12-28 MED ORDER — SODIUM CHLORIDE 0.9 % IV SOLN: 250.0000 mL | INTRAVENOUS | Status: DC | PRN

## 2012-12-28 MED ORDER — HYDRALAZINE HCL 20 MG/ML IJ SOLN
5.0000 mg | Freq: Once | INTRAMUSCULAR | Status: AC
  Administered 2012-12-28: 5 mg via INTRAVENOUS
  Filled 2012-12-28: qty 1

## 2012-12-28 MED ORDER — ATORVASTATIN CALCIUM 40 MG PO TABS
40.0000 mg | ORAL_TABLET | Freq: Every day | ORAL | Status: DC
  Administered 2012-12-28 – 2013-01-01 (×5): 40 mg via ORAL
  Filled 2012-12-28 (×5): qty 1

## 2012-12-28 MED ORDER — ASPIRIN EC 81 MG PO TBEC
81.0000 mg | DELAYED_RELEASE_TABLET | Freq: Every day | ORAL | Status: DC
  Administered 2012-12-28 – 2013-01-01 (×5): 81 mg via ORAL
  Filled 2012-12-28 (×5): qty 1

## 2012-12-28 MED ORDER — METHYLPREDNISOLONE SODIUM SUCC 125 MG IJ SOLR
125.0000 mg | Freq: Once | INTRAMUSCULAR | Status: AC
  Administered 2012-12-28: 125 mg via INTRAVENOUS
  Filled 2012-12-28: qty 2

## 2012-12-28 MED ORDER — SODIUM CHLORIDE 0.9 % IJ SOLN: 3.0000 mL | INTRAMUSCULAR | Status: DC | PRN

## 2012-12-28 MED ORDER — AZITHROMYCIN 500 MG PO TABS
500.0000 mg | ORAL_TABLET | ORAL | Status: DC
  Administered 2012-12-28 – 2012-12-31 (×4): 500 mg via ORAL
  Filled 2012-12-28 (×5): qty 1

## 2012-12-28 MED ORDER — FUROSEMIDE 10 MG/ML IJ SOLN
40.0000 mg | Freq: Once | INTRAMUSCULAR | Status: AC
  Administered 2012-12-28: 40 mg via INTRAVENOUS
  Filled 2012-12-28: qty 4

## 2012-12-28 MED ORDER — FLUOXETINE HCL 20 MG PO CAPS
20.0000 mg | ORAL_CAPSULE | Freq: Two times a day (BID) | ORAL | Status: DC
  Administered 2012-12-28 – 2013-01-01 (×9): 20 mg via ORAL
  Filled 2012-12-28 (×10): qty 1

## 2012-12-28 MED ORDER — LEVOTHYROXINE SODIUM 150 MCG PO TABS
150.0000 ug | ORAL_TABLET | Freq: Every day | ORAL | Status: DC
  Administered 2012-12-28 – 2013-01-01 (×5): 150 ug via ORAL
  Filled 2012-12-28 (×6): qty 1

## 2012-12-28 MED ORDER — WARFARIN SODIUM 7.5 MG PO TABS
7.5000 mg | ORAL_TABLET | Freq: Once | ORAL | Status: AC
  Administered 2012-12-28: 7.5 mg via ORAL
  Filled 2012-12-28: qty 1

## 2012-12-28 MED ORDER — METHYLPREDNISOLONE SODIUM SUCC 125 MG IJ SOLR
125.0000 mg | Freq: Two times a day (BID) | INTRAMUSCULAR | Status: DC
  Administered 2012-12-28 – 2012-12-29 (×3): 125 mg via INTRAVENOUS
  Filled 2012-12-28 (×4): qty 2

## 2012-12-28 MED ORDER — IPRATROPIUM-ALBUTEROL 18-103 MCG/ACT IN AERO: 2.0000 | INHALATION_SPRAY | Freq: Four times a day (QID) | RESPIRATORY_TRACT | Status: DC | PRN

## 2012-12-28 MED ORDER — MOMETASONE FURO-FORMOTEROL FUM 100-5 MCG/ACT IN AERO
2.0000 | INHALATION_SPRAY | Freq: Two times a day (BID) | RESPIRATORY_TRACT | Status: DC
  Administered 2012-12-28 – 2013-01-01 (×8): 2 via RESPIRATORY_TRACT
  Filled 2012-12-28 (×2): qty 8.8

## 2012-12-28 NOTE — ED Notes (Signed)
Confirmed with pharmacy the lasix drip and rocephin are compatible together for one IV site.

## 2012-12-28 NOTE — ED Notes (Signed)
Phlebotomy at bedside.

## 2012-12-28 NOTE — ED Notes (Signed)
Pt informed RN that "she feels like her vision is going". MD informed, EDP at bedside reevaluating pt.

## 2012-12-28 NOTE — ED Notes (Signed)
Attempted to give report 

## 2012-12-28 NOTE — ED Notes (Addendum)
[name]: [phone] POWER OF ATTORNEY. Please call with any updates, son lives in [place].  Pt gave permission for this RN to update son on pt's plan of care and current condition.

## 2012-12-28 NOTE — ED Provider Notes (Signed)
CSN: [id number]     Arrival date & time 12/28/12  0032 History   First MD Initiated Contact with Patient 12/28/12 0039     Chief Complaint  Patient presents with  . Shortness of Breath   (Consider location/radiation/quality/duration/timing/severity/associated sxs/prior Treatment) Patient is a 79 y.o. female presenting with shortness of breath. The history is provided by the patient.  Shortness of Breath Severity:  Moderate Onset quality:  Sudden Duration:  2 days Timing:  Constant Progression:  Worsening Chronicity:  Recurrent Context: URI (productive cough)   Relieved by:  Nothing Worsened by:  Nothing tried Associated symptoms: cough (prodcutive of yellow phlegm)   Associated symptoms: no abdominal pain, no chest pain, no fever and no vomiting     Past Medical History  Diagnosis Date  . Coronary atherosclerosis of native coronary artery     Mild at cateterization 2006  . Abdominal pain     Likely secondary to presbyesophagus and gastric dysmotility  . Chronic diastolic heart failure     June 2014 echo with EF 55-60% without wall motion abd but mod to severe AS  . Respiratory failure, chronic     Mixed etiology with bronchospastic component  . Urinary incontinence     Recurrent uti's/resistance to cipro, bactrim  . Gout   . Postablative hypothyroidism     H/o Graves disease s/p radioactive iodine ablation with resultant postablative hypothyroidism  . Hyperlipidemia   . Depression   . Morbidly obese     s/p gastric plication surgery  . Gastroesophageal reflux disease   . Ventral hernia     Repair in April 2008, complicated by MRSA abdominal wall cellulitis  . DVT (deep venous thrombosis) 1998  . Pulmonary embolism 1998    Greenfield filter  . Diabetes mellitus type 2, controlled, with complications   . Essential hypertension, benign   . AF (atrial fibrillation)     on chronic warfarin  . Osteoarthritis   . Stroke 1997    Denies residual  . Anemia     Blood  transfusion [V58.2]  . Supratherapeutic INR 05/15/2012    Possibly induced by steroid use.   . Chest pain at rest 08/07/2012   Past Surgical History  Procedure Laterality Date  . Cholecystectomy    . Appendectomy    . Breast biopsy    . Gastric bypass  1970's  . Hernia repair  05/2006    ventral hernia  . Greenfield filter placement  1998   Family History  Problem Relation Age of Onset  . Colon cancer Neg Hx    History  Substance Use Topics  . Smoking status: Former Smoker -- 1.00 packs/day for 1.5 years    Types: Cigarettes  . Smokeless tobacco: Never Used  . Alcohol Use: No   OB History   Grav Para Term Preterm Abortions TAB SAB Ect Mult Living                 Review of Systems  Constitutional: Negative for fever.  Respiratory: Positive for cough (prodcutive of yellow phlegm) and shortness of breath.   Cardiovascular: Negative for chest pain and leg swelling.  Gastrointestinal: Negative for vomiting and abdominal pain.  All other systems reviewed and are negative.    Allergies  Lantus and Nitrofurantoin  Home Medications   Current Outpatient Rx  Name  Route  Sig  Dispense  Refill  . acetaminophen (TYLENOL) 500 MG tablet   Oral   Take 1,000 mg by mouth 2 (two) times  daily as needed. For pain.         Marland Kitchen ADVAIR DISKUS 250-50 MCG/DOSE AEPB      INHALE ONE PUFFS BY MOUTH EVERY 12 HOURS   60 each   0   . albuterol-ipratropium (COMBIVENT) 18-103 MCG/ACT inhaler   Inhalation   Inhale 2 puffs into the lungs every 6 (six) hours as needed for wheezing or shortness of breath.   2 Inhaler   0   . allopurinol (ZYLOPRIM) 100 MG tablet   Oral   Take 1 tablet (100 mg total) by mouth 2 (two) times daily.   60 tablet   3   . aspirin EC 81 MG EC tablet   Oral   Take 1 tablet (81 mg total) by mouth daily.         Marland Kitchen atorvastatin (LIPITOR) 40 MG tablet   Oral   Take 40 mg by mouth daily.         . colchicine 0.6 MG tablet   Oral   Take 0.6 mg by mouth See  admin instructions. Take for 3 days when gout flares up         . FLUoxetine (PROZAC) 20 MG capsule   Oral   Take 1 capsule (20 mg total) by mouth 2 (two) times daily.   180 capsule   1   . furosemide (LASIX) 80 MG tablet   Oral   Take 1 tablet (80 mg total) by mouth 2 (two) times daily.   60 tablet   1   . gabapentin (NEURONTIN) 100 MG capsule      TAKE TWO CAPSULES BY MOUTH TWICE DAILY   90 capsule   2   . glipiZIDE (GLUCOTROL) 10 MG tablet      TAKE ONE TABLET BY MOUTH TWICE DAILY BEFORE MEALS   60 tablet   4   . levothyroxine (SYNTHROID) 150 MCG tablet   Oral   Take 1 tablet (150 mcg total) by mouth daily.   30 tablet   11   . loperamide (IMODIUM) 2 MG capsule   Oral   Take 1 capsule (2 mg total) by mouth as needed for diarrhea or loose stools (up to total 16mg  daily).   8 capsule   0   . LORazepam (ATIVAN) 1 MG tablet   Oral   Take 1 tablet (1 mg total) by mouth at bedtime as needed for anxiety (for sleep).   30 tablet   2   . montelukast (SINGULAIR) 10 MG tablet   Oral   Take 1 tablet (10 mg total) by mouth daily.   30 tablet   2   . omeprazole (PRILOSEC) 20 MG capsule      TAKE TWO CAPSULES BY MOUTH ONCE DAILY   60 capsule   2   . polyethylene glycol powder (GLYCOLAX/MIRALAX) powder      USE AS DIRECTED   527 g   0   . SPIRIVA HANDIHALER 18 MCG inhalation capsule      PLACE ONE CAPSULE INTO DEVICE AND INHALE 1 PUFF DAILY   30 capsule   11   . warfarin (COUMADIN) 5 MG tablet      Take 5mg  every day (M,TU,TH,SAT,SUN) EXCEPT take 2.5mg  on Wednesday's   30 tablet   0    BP 117/80  Pulse 46  Temp(Src) 97 F (36.1 C) (Axillary)  Resp 15  Ht 5\' 1"  (1.549 m)  Wt 283 lb (128.368 kg)  BMI 53.50 kg/m2  SpO2 100%  LMP 05/22/1968 Physical Exam  Nursing note and vitals reviewed. Constitutional: She is oriented to person, place, and time. She appears well-developed and well-nourished. No distress.  HENT:  Head: Normocephalic and  atraumatic.  Eyes: EOM are normal. Pupils are equal, round, and reactive to light.  Neck: Normal range of motion. Neck supple.  Cardiovascular: Normal rate and regular rhythm.  Exam reveals no friction rub.   No murmur heard. Pulmonary/Chest: Effort normal. No respiratory distress. She has wheezes (scattered). She has rales (diffuse).  Abdominal: Soft. She exhibits no distension. There is no tenderness. There is no rebound and no guarding.  Musculoskeletal: Normal range of motion. She exhibits no edema.  Neurological: She is alert and oriented to person, place, and time. No cranial nerve deficit. She exhibits normal muscle tone. Coordination normal.  Skin: No rash noted. She is not diaphoretic.    ED Course  Procedures (including critical care time) Labs Review Labs Reviewed  CBC  BASIC METABOLIC PANEL  TROPONIN I  PRO B NATRIURETIC PEPTIDE   Imaging Review No results found.  EKG Interpretation     Ventricular Rate:  49 PR Interval:    QRS Duration: 151 QT Interval:  543 QTC Calculation: 490 R Axis:   -106 Text Interpretation:  Junctional rhythm Nonspecific IVCD with LAD Anterolateral infarct, age indeterminate No change from prior            MDM   1. CHF (congestive heart failure), acute on chronic, diastolic   2. Pneumonia, community acquired   3. Shortness of breath    54F presents for shortness of breath. Began today. I worsen she got up to move around. She states some productive cough and phlegm. She has a history of COPD, CHF. She's been compliant with her medications. She denies fever, chest pain, nausea, vomiting. She wears 4 L of oxygen at all times. She was placed on CPAP by EMS to help with her breathing, however off of it on arrival and states she doesn't want it back. No marked respiratory distress, patient satting 93% on room air. Decreased air movement in all fields, rales diffusely, no rhonchi. No peripheral edema or JVD. Will refrain from placing back  on BiPap. With hx of COPD, will also give continuous albutero. Patient given 100 mg of Lasix as her CXR appears to have vascular congestion.  Admitted to medicine teaching service.    Dagmar Hait, MD 12/28/12 [phone]

## 2012-12-28 NOTE — Progress Notes (Signed)
Subjective: [name] is a 79 y.o. female presenting with SOB.  Overnight the patients mental status remained diminished. She appears to be in respiratory distress. Her BP had decreased to 120'130's SBP.  Objective: Vital signs in last 24 hours: Filed Vitals:   12/28/12 0530 12/28/12 0619 12/28/12 0630 12/28/12 0645  BP: 168/66 142/65 142/50 135/53  Pulse: 62 59 57 59  Temp:  97.1 F (36.2 C)    TempSrc:  Axillary    Resp: 17 18 18 20   Height:  5\' 1"  (1.549 m)    Weight:  272 lb 4.3 oz (123.5 kg)    SpO2: 95% 99% 98% 99%   Weight change:   Intake/Output Summary (Last 24 hours) at 12/28/12 0715 Last data filed at 12/28/12 0429  Gross per 24 hour  Intake    400 ml  Output      0 ml  Net    400 ml   Physical Exam  Nursing note and vitals reviewed.  Constitutional: She is oriented to person, place, and time and well-developed, well-nourished, and in no distress. No distress.  HENT:  Head: Normocephalic and atraumatic.  Eyes: Conjunctivae and EOM are normal. Pupils are equal, round, and reactive to light.  Cardiovascular: Normal rate and regular rhythm.  Murmur (3/6 SEM) heard.  Pulmonary/Chest: Effort normal. No respiratory distress. She has severely decreased breath sounds bialterally. She has no wheezes. She has no rales.  Abdominal: Soft. Bowel sounds are normal. She exhibits no distension. There is no tenderness. There is no rebound. A series of small non draining non purulent no erythematous wounds on the abdomen. Morbidly obese  Musculoskeletal: She exhibits no edema.  Feet:  Neurological: She is minimally arousable to voice and pain. Not oriented to person place or time. Skin: Skin is warm and dry. She is not diaphoretic.      Lab Results: Basic Metabolic Panel:  Recent Labs Lab 12/28/12 0046  NA 138  K 4.3  CL 95*  CO2 36*  GLUCOSE 135*  BUN 30*  CREATININE 1.05  CALCIUM 10.1   Liver Function Tests: No results found for this basename: AST,  ALT, ALKPHOS, BILITOT, PROT, ALBUMIN,  in the last 168 hours No results found for this basename: LIPASE, AMYLASE,  in the last 168 hours No results found for this basename: AMMONIA,  in the last 168 hours CBC:  Recent Labs Lab 12/28/12 0046  WBC 8.9  HGB 9.8*  HCT 33.7*  MCV 80.8  PLT 206   Cardiac Enzymes:  Recent Labs Lab 12/28/12 0047  TROPONINI <0.30   BNP:  Recent Labs Lab 12/28/12 0047  PROBNP 7922.0*   ABG: 7.39, 82, 57, 33  Studies/Results: Dg Chest Port 1 View  12/28/2012   CLINICAL DATA:  Productive cough and shortness of breath.  EXAM: PORTABLE CHEST - 1 VIEW  COMPARISON:  CHEST x-ray 09/03/2012.  FINDINGS: Opacity at the base of the left hemithorax may reflect atelectasis and/or consolidation. Possible small left pleural effusion. Diffuse interstitial prominence. No definite cephalization of the pulmonary vasculature. Heart size appears mildly enlarged. The patient is rotated to the left on today's exam, resulting in distortion of the mediastinal contours and reduced diagnostic sensitivity and specificity for mediastinal pathology. Atherosclerosis in the thoracic aorta.  IMPRESSION: 1. Left retrocardiac opacity may reflect atelectasis and/or consolidation in the left lower lobe. Possible small left pleural effusion. 2. Mild diffuse interstitial prominence is of uncertain etiology and significance, and may in part be technique related.  3. Mild cardiomegaly. 4. Atherosclerosis.   Electronically Signed   By: Trudie Reed M.D.   On: 12/28/2012 01:07   Medications: I have reviewed the patient's current medications. Scheduled Meds: . allopurinol  100 mg Oral BID  . aspirin EC  81 mg Oral Daily  . atorvastatin  40 mg Oral Daily  . azithromycin  500 mg Oral Q24H  . cefTRIAXone (ROCEPHIN)  IV  1 g Intravenous Q24H  . colchicine  1.2 mg Oral Once   Followed by  . colchicine  0.6 mg Oral Once  . FLUoxetine  20 mg Oral BID  . furosemide  80 mg Oral BID  . gabapentin   100 mg Oral BID  . insulin aspart  0-15 Units Subcutaneous TID WC  . levothyroxine  150 mcg Oral QAC breakfast  . mometasone-formoterol  2 puff Inhalation BID  . montelukast  10 mg Oral Daily  . pantoprazole  40 mg Oral Daily  . sodium chloride  3 mL Intravenous Q12H  . tiotropium  18 mcg Inhalation Daily   Continuous Infusions:  PRN Meds:.sodium chloride, acetaminophen, Ipratropium-Albuterol, LORazepam, sodium chloride Assessment/Plan: Principal Problem:   CAP (community acquired pneumonia) Active Problems:   HYPOTHYROIDISM   DIABETES MELLITUS   HYPERLIPIDEMIA   GOUT   OBESITY, MORBID   HYPERTENSION   Atrial fibrillation   COPD   CAD (coronary artery disease)   Aortic stenosis   Hypertensive urgency  Assessment & Plan by Problem:  [name] is a 79yo F with history of HF c/b AS and AR, pulm HTN, gout, afib on a/c, DM and chronic respiratory failure on 4L home O2 admitted on 12/28/12 with complaints of SOB with sputum producing cough.   #COPD exacerbation c/b CAP: Based on symptoms and evidenced by CXR. Saturating well on home O2. CURB 65 score is 2, suggesting 9% risk of death.  - Admit to SDU - Cont Ceftriaxone/Azithro x 7 d  - Follow sputum cx, blood cx, legionella & strep AG  - Titrate O2 to >92% - Methylprednisolone 125 mg BID - F/U 2 view CXR  #Acute Encephalopathy May be 2/2 to COPD exacerbation and hypercarbia (57, baseline 39). Cannot exclude CVA, hepatic encephalopathy, or UTI at this time. Will consider CT head and follow up UA, CMP, and Ammonia.   #Hypertensive Urgency: In the setting of history of HFPEF; BP 240/200 at admission. Likely due to fluid overload from missing meds today & acute stress; treated with lasix gtt 100mg  in ED.  - Restart home lasix 80mg  PO BID (Holding AM dose due to rapid drop in BP) - BMET q12 to monitor K & Mg   #Gout: Appears to have acute flare in setting of acute infection  -Cont allopurinol, as patient already on for ppx    -Treat with Colchicine (CrCl 33, no adjustment in dose --> 1.2mg  now, then 0.6mg  one hour later)   #Afib: rate controlled/bradycardic (chronic with h/o SSS, junctional rhythm on EKG at admission)  -Follow INR (added on at admission)  -Cont coumadin per pharmacy   #DM: Last HbA1c 6.7 on 11/10/12. Hold home glipizide during hospitalization  -SSI mod   #Hypothyroidism: Last TSH 4.14 (wnl) on 06/26/12  -Cont home dose synthroid   #Stage 3 CKD: At baseline, continue to monitor   #Normocytic Anemia: Stable (baseline ~10), monitor as needed   #VTE ppx: on coumadin   #Code Status: Full Code   Dispo: Disposition is deferred at this time, awaiting improvement of current medical problems.  Anticipated discharge in approximately 2-3 day(s).   The patient does have a current PCP (Aletta Edouard, MD) and does need an [facility] hospital follow-up appointment after discharge.  The patient does have transportation limitations that hinder transportation to clinic appointments.  .Services Needed at time of discharge: Y = Yes, Blank = No PT:   OT:   RN:   Equipment:   Other:     LOS: 0 days   Pleas Koch, MD 12/28/2012, 7:15 AM

## 2012-12-28 NOTE — H&P (Signed)
Date: 12/28/2012               Patient Name:  [name] MRN: [id number]  DOB: 1932-11-06 Age / Sex: 79 y.o., female   PCP: [name] Edouard, MD         Medical Service: Internal Medicine Teaching Service         Attending Physician: Dr. Burns Spain, MD    First Contact: Dr. Glendell Docker Pager: [phone]  Second Contact: Dr. Burtis Junes Pager: [phone]       After Hours (After 5p/  First Contact Pager: [phone]  weekends / holidays): Second Contact Pager: [phone]   Chief Complaint: Fever/Cough/SOB  History of Present Illness: [name] is a 79 year old female with a PMH of HFPEF, moderate AS,  DM w/ neuropathy, Hypothyroidism, HTN, A fib with chronic A/C, COPD (on 4L [place] home O2), CKD stage 3.  She presents to [facility] after a 4 day history of increased SOB, productive cough (greenish/yellow sputum), subjective fever and chills.  She also reports some generalized muscle aches and confusion. She reports that her nursing aid has recently been sick with "the flu."  She has received her flu shot this year.   She denies any chest pain or discomfort.  She also reports some pain in her left and right big toes that has been going on for over a week.   Meds: No current facility-administered medications for this encounter.   Current Outpatient Prescriptions  Medication Sig Dispense Refill  . acetaminophen (TYLENOL) 500 MG tablet Take 1,000 mg by mouth 2 (two) times daily as needed. For pain.      Marland Kitchen ADVAIR DISKUS 250-50 MCG/DOSE AEPB INHALE ONE PUFFS BY MOUTH EVERY 12 HOURS  60 each  0  . albuterol-ipratropium (COMBIVENT) 18-103 MCG/ACT inhaler Inhale 2 puffs into the lungs every 6 (six) hours as needed for wheezing or shortness of breath.  2 Inhaler  0  . allopurinol (ZYLOPRIM) 100 MG tablet Take 1 tablet (100 mg total) by mouth 2 (two) times daily.  60 tablet  3  . aspirin EC 81 MG EC tablet Take 1 tablet (81 mg total) by mouth daily.      Marland Kitchen atorvastatin (LIPITOR) 40 MG tablet Take 40 mg  by mouth daily.      . colchicine 0.6 MG tablet Take 0.6 mg by mouth See admin instructions. Take for 3 days when gout flares up      . FLUoxetine (PROZAC) 20 MG capsule Take 1 capsule (20 mg total) by mouth 2 (two) times daily.  180 capsule  1  . furosemide (LASIX) 80 MG tablet Take 1 tablet (80 mg total) by mouth 2 (two) times daily.  60 tablet  1  . gabapentin (NEURONTIN) 100 MG capsule TAKE TWO CAPSULES BY MOUTH TWICE DAILY  90 capsule  2  . glipiZIDE (GLUCOTROL) 10 MG tablet TAKE ONE TABLET BY MOUTH TWICE DAILY BEFORE MEALS  60 tablet  4  . levothyroxine (SYNTHROID) 150 MCG tablet Take 1 tablet (150 mcg total) by mouth daily.  30 tablet  11  . loperamide (IMODIUM) 2 MG capsule Take 1 capsule (2 mg total) by mouth as needed for diarrhea or loose stools (up to total 16mg  daily).  8 capsule  0  . LORazepam (ATIVAN) 1 MG tablet Take 1 tablet (1 mg total) by mouth at bedtime as needed for anxiety (for sleep).  30 tablet  2  . montelukast (SINGULAIR) 10 MG tablet Take 1 tablet (  10 mg total) by mouth daily.  30 tablet  2  . omeprazole (PRILOSEC) 20 MG capsule TAKE TWO CAPSULES BY MOUTH ONCE DAILY  60 capsule  2  . polyethylene glycol powder (GLYCOLAX/MIRALAX) powder USE AS DIRECTED  527 g  0  . SPIRIVA HANDIHALER 18 MCG inhalation capsule PLACE ONE CAPSULE INTO DEVICE AND INHALE 1 PUFF DAILY  30 capsule  11  . warfarin (COUMADIN) 5 MG tablet Take 5mg  every day (M,TU,TH,SAT,SUN) EXCEPT take 2.5mg  on Wednesday's  30 tablet  0    Allergies: Allergies as of 12/28/2012 - Review Complete 12/28/2012  Allergen Reaction Noted  . Lantus [insulin glargine]  05/13/2012  . Nitrofurantoin [macrodantin] Itching 04/20/2010   Past Medical History  Diagnosis Date  . Coronary atherosclerosis of native coronary artery     Mild at cateterization 2006  . Abdominal pain     Likely secondary to presbyesophagus and gastric dysmotility  . Chronic diastolic heart failure     June 2014 echo with EF 55-60% without  wall motion abd but mod to severe AS  . Respiratory failure, chronic     Mixed etiology with bronchospastic component  . Urinary incontinence     Recurrent uti's/resistance to cipro, bactrim  . Gout   . Postablative hypothyroidism     H/o Graves disease s/p radioactive iodine ablation with resultant postablative hypothyroidism  . Hyperlipidemia   . Depression   . Morbidly obese     s/p gastric plication surgery  . Gastroesophageal reflux disease   . Ventral hernia     Repair in April 2008, complicated by MRSA abdominal wall cellulitis  . DVT (deep venous thrombosis) 1998  . Pulmonary embolism 1998    Greenfield filter  . Diabetes mellitus type 2, controlled, with complications   . Essential hypertension, benign   . AF (atrial fibrillation)     on chronic warfarin  . Osteoarthritis   . Stroke 1997    Denies residual  . Anemia     Blood transfusion [V58.2]  . Supratherapeutic INR 05/15/2012    Possibly induced by steroid use.   . Chest pain at rest 08/07/2012   Past Surgical History  Procedure Laterality Date  . Cholecystectomy    . Appendectomy    . Breast biopsy    . Gastric bypass  1970's  . Hernia repair  05/2006    ventral hernia  . Greenfield filter placement  1998   Family History  Problem Relation Age of Onset  . Colon cancer Neg Hx    History   Social History  . Marital Status: Widowed    Spouse Name: N/A    Number of Children: N/A  . Years of Education: N/A   Occupational History  . Not on file.   Social History Main Topics  . Smoking status: Former Smoker -- 1.00 packs/day for 1.5 years    Types: Cigarettes  . Smokeless tobacco: Never Used  . Alcohol Use: No  . Drug Use: No  . Sexual Activity: No   Other Topics Concern  . Not on file   Social History Narrative   She lives w/her son. Her son offers help and she has an aid who occasionally cooks for her and takes care of some other tasks around the pt's house.   Has medicare. Has son in [place] who  is POA   Ambivalent about DNR issues and does not want a STOP sign posted in home   Home Health Aid visits 6 days a  week             Review of Systems: Review of Systems  Constitutional: Positive for fever, chills and malaise/fatigue.  HENT: Negative for congestion and sore throat.   Eyes: Negative for blurred vision and double vision.  Respiratory: Positive for cough, sputum production, shortness of breath and wheezing. Negative for hemoptysis.   Cardiovascular: Negative for chest pain, palpitations, orthopnea and leg swelling.  Gastrointestinal: Positive for constipation (chronic). Negative for heartburn, nausea, vomiting, abdominal pain, diarrhea, blood in stool and melena.  Genitourinary: Negative for dysuria, urgency and frequency.  Musculoskeletal: Positive for joint pain. Negative for falls.  Neurological: Negative for dizziness, sensory change, speech change, weakness and headaches.     Physical Exam: Blood pressure 223/195, pulse 66, temperature 97 F (36.1 C), temperature source Axillary, resp. rate 22, height 5\' 1"  (1.549 m), weight 283 lb (128.368 kg), last menstrual period 05/22/1968, SpO2 95.00%. Physical Exam  Nursing note and vitals reviewed. Constitutional: She is oriented to person, place, and time and well-developed, well-nourished, and in no distress. No distress.  HENT:  Head: Normocephalic and atraumatic.  Fundoscopic exam attempted, no obvious hemorrhage     Eyes: Conjunctivae and EOM are normal. Pupils are equal, round, and reactive to light.  Cardiovascular: Normal rate and regular rhythm.   Murmur (2/6 SEM) heard. Pulmonary/Chest: Effort normal. No respiratory distress. She has decreased breath sounds in the left lower field. She has no wheezes. She has no rales.  Abdominal: Soft. Bowel sounds are normal. She exhibits no distension. There is no tenderness. There is no rebound.  obese  Musculoskeletal: She exhibits no edema.       Feet:  Neurological:  She is alert and oriented to person, place, and time.  Skin: Skin is warm and dry. She is not diaphoretic.  Psychiatric: Mood, affect and judgment normal.     Lab results: Basic Metabolic Panel:  Recent Labs  16/10/96 0046  NA 138  K 4.3  CL 95*  CO2 36*  GLUCOSE 135*  BUN 30*  CREATININE 1.05  CALCIUM 10.1   CBC:  Recent Labs  12/28/12 0046  WBC 8.9  HGB 9.8*  HCT 33.7*  MCV 80.8  PLT 206   Cardiac Enzymes:  Recent Labs  12/28/12 0047  TROPONINI <0.30   BNP:  Recent Labs  12/28/12 0047  PROBNP 7922.0*   Imaging results:  Dg Chest Port 1 View  12/28/2012   CLINICAL DATA:  Productive cough and shortness of breath.  EXAM: PORTABLE CHEST - 1 VIEW  COMPARISON:  CHEST x-ray 09/03/2012.  FINDINGS: Opacity at the base of the left hemithorax may reflect atelectasis and/or consolidation. Possible small left pleural effusion. Diffuse interstitial prominence. No definite cephalization of the pulmonary vasculature. Heart size appears mildly enlarged. The patient is rotated to the left on today's exam, resulting in distortion of the mediastinal contours and reduced diagnostic sensitivity and specificity for mediastinal pathology. Atherosclerosis in the thoracic aorta.  IMPRESSION: 1. Left retrocardiac opacity may reflect atelectasis and/or consolidation in the left lower lobe. Possible small left pleural effusion. 2. Mild diffuse interstitial prominence is of uncertain etiology and significance, and may in part be technique related. 3. Mild cardiomegaly. 4. Atherosclerosis.   Electronically Signed   By: Trudie Reed M.D.   On: 12/28/2012 01:07    Other results: EKG: bradycardic with rate of 49, junctional rhythm, nonspecific IVCD, unchanged from previous EKG.  Assessment & Plan by Problem: Ms. Balla is a 79yo F with  history of HFPEF, gout, afib on a/c, DM and chronic respiratory failure on 4L home O2 admitted on 12/28/12 with complaints of SOB with sputum producing  cough.  #CAP: Based on symptoms and evidenced by CXR. Saturating well on home O2. CURB 65 score is 2, suggesting 9% risk of death.  -Admit to SDU given HTN urgency (inpatient)  -Cont Ceftriaxone/Azithro x 7 d  -Follow sputum cx, blood cx, legionella & strep AG  -Cont home O2 & inhalers  #Hypertensive Urgency: In the setting of history of HFPEF; BP 240/200 at admission. Likely due to fluid overload from missing meds today & acute stress; treated with lasix gtt 100mg  in ED.  -Additional Lasix 40mg  IV x 1 and Hydralazine 5mg  IV x 1 now  -Restart home lasix 80mg  PO BID  -AM BMET to monitor K & Mg  #Gout: Appears to have acute flare in setting of acute infection  -Cont allopurinol, as patient already on for ppx  -Treat with Colchicine (CrCl 33, no adjustment in dose --> 1.2mg  now, then 0.6mg  one hour later)  #Afib: rate controlled/bradycardic (chronic with h/o SSS, junctional rhythm on EKG at admission)  -Follow INR (added on at admission)  -Cont coumadin per pharmacy  #DM: Last HbA1c 6.7 on 11/10/12. Hold home glipizide during hospitalization  -SSI mod  #Hypothyroidism: Last TSH 4.14 (wnl) on 06/26/12  -Cont home dose synthroid  #Stage 3 CKD: At baseline, continue to monitor  #Normocytic Anemia: Stable (baseline ~10), monitor as needed  #VTE ppx: on coumadin  #Code Status: Full Code   Dispo: Disposition is deferred at this time, awaiting improvement of current medical problems. Anticipated discharge in approximately 2-3 day(s).   The patient does have a current PCP ([name] Edouard, MD) and does need an [facility] hospital follow-up appointment after discharge.  The patient does not know have transportation limitations that hinder transportation to clinic appointments.  Signed: Carlynn Purl, DO 12/28/2012, 4:03 AM

## 2012-12-28 NOTE — ED Notes (Signed)
Admitting MDs at bedside and reported pt's BP was significantly elevated from last recorded BP. See new orders.

## 2012-12-28 NOTE — Consult Note (Signed)
PULMONARY  / CRITICAL CARE MEDICINE  Name: [name] MRN: [id number] DOB: 1932-11-01    ADMISSION DATE:  12/28/2012 CONSULTATION DATE:  12/28/2012  REFERRING MD :  Danise Edge PRIMARY SERVICE: IMTS  CHIEF COMPLAINT:  Acute encephalopathy  BRIEF PATIENT DESCRIPTION: 79 yo with multiple co-morbidities, presented to [facility] ED with dyspnea, productive cough, fever/chills x 4 days.  Hypertensive (240/200) on admission.  Developed altered mental status and PCCM was consulted to evaluate.  SIGNIFICANT EVENTS / STUDIES:  11/3  Admitted with dyspnea and hypertension  LINES / TUBES:  CULTURES: 11/3 Sputum >>> 11/3 Blood >>>  ANTIBIOTICS: Azithromycin 11/3 >>> Ceftriaxone 11/3 >>  HISTORY OF PRESENT ILLNESS:  [name] is a 79 y.o. female with multiple co-morbidities including COPD, on 4L [place] home O2.  She presents to [facility] after a 4 day history of increased SOB, productive cough (greenish/yellow sputum), subjective fever and chills. She also reports some generalized muscle aches and confusion. She reports that her nursing aid has recently been sick with "the flu." She has received her flu shot this year. She denies any chest pain or discomfort. She also reports some pain in her left and right big toes that has been going on for over a week (she does have a hx of gout). In ED, pt had a BP of 240/200 and was experiencing continued SOB.  CXR suggestive of fluid overload, pt treated with Lasix. After pt got to floor, she became altered, unable to tell staff what her name is, where she is, what year it is.    PAST MEDICAL HISTORY :  Past Medical History  Diagnosis Date  . Coronary atherosclerosis of native coronary artery     Mild at cateterization 2006  . Abdominal pain     Likely secondary to presbyesophagus and gastric dysmotility  . Chronic diastolic heart failure     June 2014 echo with EF 55-60% without wall motion abd but mod to severe AS  . Respiratory failure, chronic     Mixed etiology  with bronchospastic component  . Urinary incontinence     Recurrent uti's/resistance to cipro, bactrim  . Gout   . Postablative hypothyroidism     H/o Graves disease s/p radioactive iodine ablation with resultant postablative hypothyroidism  . Hyperlipidemia   . Depression   . Morbidly obese     s/p gastric plication surgery  . Gastroesophageal reflux disease   . Ventral hernia     Repair in April 2008, complicated by MRSA abdominal wall cellulitis  . DVT (deep venous thrombosis) 1998  . Pulmonary embolism 1998    Greenfield filter  . Diabetes mellitus type 2, controlled, with complications   . Essential hypertension, benign   . AF (atrial fibrillation)     on chronic warfarin  . Osteoarthritis   . Stroke 1997    Denies residual  . Anemia     Blood transfusion [V58.2]  . Supratherapeutic INR 05/15/2012    Possibly induced by steroid use.   . Chest pain at rest 08/07/2012   Past Surgical History  Procedure Laterality Date  . Cholecystectomy    . Appendectomy    . Breast biopsy    . Gastric bypass  1970's  . Hernia repair  05/2006    ventral hernia  . Greenfield filter placement  1998   Prior to Admission medications   Medication Sig Start Date End Date Taking? Authorizing Provider  acetaminophen (TYLENOL) 500 MG tablet Take 1,000 mg by mouth 2 (two) times  daily as needed. For pain.    Historical Provider, MD  ADVAIR DISKUS 250-50 MCG/DOSE AEPB INHALE ONE PUFFS BY MOUTH EVERY 12 HOURS 11/22/12   Aletta Edouard, MD  albuterol-ipratropium (COMBIVENT) 18-103 MCG/ACT inhaler Inhale 2 puffs into the lungs every 6 (six) hours as needed for wheezing or shortness of breath. 12/10/12   Dow Adolph, MD  allopurinol (ZYLOPRIM) 100 MG tablet Take 1 tablet (100 mg total) by mouth 2 (two) times daily. 08/24/12   Aletta Edouard, MD  aspirin EC 81 MG EC tablet Take 1 tablet (81 mg total) by mouth daily. 04/23/12   Darden Palmer, MD  atorvastatin (LIPITOR) 40 MG tablet Take 40 mg by mouth  daily.    Historical Provider, MD  colchicine 0.6 MG tablet Take 0.6 mg by mouth See admin instructions. Take for 3 days when gout flares up    Historical Provider, MD  FLUoxetine (PROZAC) 20 MG capsule Take 1 capsule (20 mg total) by mouth 2 (two) times daily. 10/12/12   Burns Spain, MD  furosemide (LASIX) 80 MG tablet Take 1 tablet (80 mg total) by mouth 2 (two) times daily. 12/14/12   Aletta Edouard, MD  gabapentin (NEURONTIN) 100 MG capsule TAKE TWO CAPSULES BY MOUTH TWICE DAILY 10/19/12   Aletta Edouard, MD  glipiZIDE (GLUCOTROL) 10 MG tablet TAKE ONE TABLET BY MOUTH TWICE DAILY BEFORE MEALS 09/20/12   Aletta Edouard, MD  levothyroxine (SYNTHROID) 150 MCG tablet Take 1 tablet (150 mcg total) by mouth daily. 04/01/12 04/01/13  Aletta Edouard, MD  loperamide (IMODIUM) 2 MG capsule Take 1 capsule (2 mg total) by mouth as needed for diarrhea or loose stools (up to total 16mg  daily). 04/23/12   Darden Palmer, MD  LORazepam (ATIVAN) 1 MG tablet Take 1 tablet (1 mg total) by mouth at bedtime as needed for anxiety (for sleep). 10/28/12 10/28/13  Aletta Edouard, MD  montelukast (SINGULAIR) 10 MG tablet Take 1 tablet (10 mg total) by mouth daily. 08/20/12 08/20/13  Bronson Curb, MD  omeprazole (PRILOSEC) 20 MG capsule TAKE TWO CAPSULES BY MOUTH ONCE DAILY 12/13/12   Aletta Edouard, MD  polyethylene glycol powder (GLYCOLAX/MIRALAX) powder USE AS DIRECTED 11/04/12   Aletta Edouard, MD  SPIRIVA HANDIHALER 18 MCG inhalation capsule PLACE ONE CAPSULE INTO DEVICE AND INHALE 1 PUFF DAILY 08/21/12   Aletta Edouard, MD  warfarin (COUMADIN) 5 MG tablet Take 5mg  every day (M,TU,TH,SAT,SUN) EXCEPT take 2.5mg  on Wednesday's 12/13/12   Darden Palmer, MD   Allergies  Allergen Reactions  . Lantus [Insulin Glargine]     Causes a lot of itching  . Nitrofurantoin [Macrodantin] Itching   FAMILY HISTORY:  Family History  Problem Relation Age of Onset  . Colon cancer Neg Hx    SOCIAL HISTORY:  reports that she  has quit smoking. Her smoking use included Cigarettes. She has a 1.5 pack-year smoking history. She has never used smokeless tobacco. She reports that she does not drink alcohol or use illicit drugs.  REVIEW OF SYSTEMS:  Negative except as stated in HPI.  SUBJECTIVE:   VITAL SIGNS: Temp:  [97 F (36.1 C)-98.1 F (36.7 C)] 98.1 F (36.7 C) (11/03 0802) Pulse Rate:  [46-66] 61 (11/03 0802) Resp:  [14-22] 18 (11/03 0802) BP: (117-228)/(45-196) 125/45 mmHg (11/03 0802) SpO2:  [93 %-100 %] 96 % (11/03 0802) Weight:  [272 lb 4.3 oz (123.5 kg)-283 lb (128.368 kg)] 272 lb 4.3 oz (123.5 kg) (11/03 0619)  HEMODYNAMICS:   VENTILATOR SETTINGS:   INTAKE / OUTPUT:  Intake/Output     11/02 0701 - 11/03 0700 11/03 0701 - 11/04 0700   I.V. (mL/kg) 400 (3.2)    Total Intake(mL/kg) 400 (3.2)    Net +400           PHYSICAL EXAMINATION: General:  Obese female, lying in bed, in NAD. Neuro:  Awake but not oriented to person, place, or time.   HEENT:  [place]/AT.  PERRL.  MMM. Cardiovascular:  RRR, no M/R/G. Lungs:  Resp's even and unlabored.  Decreased breath sounds in LLL.  Scattered rhonchi throughout. Abdomen:  BS x 4.  Soft, NT/ND. Musculoskeletal:  No gross deformities.  No edema.  Tenderness in right ankle and left great toe. Skin:  Warm and dry.   Recent Labs Lab 12/28/12 0046 12/28/12 0047 12/28/12 0615 12/28/12 0703 12/28/12 0800  HGB 9.8*  --   --   --   --   WBC 8.9  --   --   --   --   PLT 206  --   --   --   --   NA 138  --   --  140  --   K 4.3  --   --  3.8  --   CL 95*  --   --  95*  --   CO2 36*  --   --  33*  --   GLUCOSE 135*  --   --  294*  --   BUN 30*  --   --  34*  --   CREATININE 1.05  --   --  1.10  --   CALCIUM 10.1  --   --  10.0  --   MG  --   --   --  2.1  --   INR  --   --  1.53*  --   --   TROPONINI  --  <0.30  --   --   --   PHART  --   --   --   --  7.390  PCO2ART  --   --   --   --  57.2*  PO2ART  --   --   --   --  82.0  HCO3  --   --   --   --   33.9*  O2SAT  --   --   --   --  95.7   CXR:  10/3 >>> LLL airspace disease / small pleural effusion  ASSESSMENT / PLAN:  Pneumonia (CAP) COPD, with acute exacerbation Chronic hypoxemic / hypercarbic respiratory failure (CO2 appears at baseline) Acute encephalopathy, but protects airway Possibly acute delirium in setting of advanced age, systemic steroids, infection and acute hospital admission  -->  Agree Azithromycin / Ceftriaxone for total of 7 days -->  Follow cultures -->  Supplemental oxygen, goal SpO2>92% -->  Agree with bronchodilators  -->  Agree with Solu-Medrol, may change to Prednisone 40 in AM if able to take PO -->  No indications for NIMV, if mental status worse - will require intubation as BiPAP would be contraindicated -->  Trend CXR -->  Head CT (r/o ischemic CVA - rapid drop in BP and hemorrhagic CVA - on Coumadin) -->  Hold Neurontin and avoid benzodiazepines -->  Will follow  Rutherford Guys, PA - S  I have personally obtained a history, examined the patient, evaluated laboratory and imaging results, formulated the assessment and plan and placed orders.  Lonia Farber, MD Pulmonary and Critical Care Medicine [facility] Pager: [phone]  12/28/2012, 9:05 AM

## 2012-12-28 NOTE — Progress Notes (Signed)
Rec'd order for sputum culture.  [facility] clear diminished, pt unable to cough up sputum at this time.

## 2012-12-28 NOTE — H&P (Signed)
  Date: 12/28/2012  Patient name: [name]  Medical record number: [id number]  Date of birth: 16-Dec-1932   I have seen and evaluated [name] and discussed their care with the Residency Team.   Assessment and Plan: I have seen and evaluated the patient as outlined above. I agree with the formulated Assessment and Plan as detailed in the residents' admission note, with the following changes:   1. Acute on chronic resp failure - pt's resp status has stablized / improved with decreased O2 by [place], lasix, nebs, ABX, and steroids. She is not in resp distress, she is moving good air and her lungs are clear, and is able to cough. She will need to remain in step down today. Appreciate ICU input.  2. Chronic resp failure O2 dep  3. Acute delirium - likely multifactorial. Currently not oriented to person, place. This is not her baseline - I saw her with Dr Kirtland Bouchard in [facility] Oct 16th. She is moving all 4 ext and has equal strength B upper and lower. We will treat the underlying resp d/o.  Burns Spain, MD 11/3/201411:15 AM

## 2012-12-28 NOTE — ED Notes (Addendum)
Per EMS pt got up from bed to go to the bathroom and pt states she felt very SOB. Pt states she tried to use her inhaler but it did not help, pt requested a breathing tx. EMS placed pt on CPAP and pt states she felt better. EMS reports pt has bilateral upper lobe crackles. Per EMS pt is A&O X4. Per EMS pt grunts, which is normal for the pt. Pt lives at home with her son. EMS reports the pt is able to ambulate with a walk. EMS reports pt has a hx of CHF & COPD.  Pt states she wears 4L 02 at home all the time.

## 2012-12-28 NOTE — Progress Notes (Signed)
ANTICOAGULATION CONSULT NOTE - Initial Consult  Pharmacy Consult for Warfarin  Indication: atrial fibrillation  Allergies  Allergen Reactions  . Lantus [Insulin Glargine]     Causes a lot of itching  . Nitrofurantoin [Macrodantin] Itching    Patient Measurements: Height: 5\' 1"  (154.9 cm) Weight: 272 lb 4.3 oz (123.5 kg) IBW/kg (Calculated) : 47.8  Vital Signs: Temp: 97.1 F (36.2 C) (11/03 0619) Temp src: Axillary (11/03 0619) BP: 135/53 mmHg (11/03 0645) Pulse Rate: 59 (11/03 0645)  Labs:  Recent Labs  12/28/12 0046 12/28/12 0047 12/28/12 0615  HGB 9.8*  --   --   HCT 33.7*  --   --   PLT 206  --   --   LABPROT  --   --  18.0*  INR  --   --  1.53*  CREATININE 1.05  --   --   TROPONINI  --  <0.30  --     Estimated Creatinine Clearance: 54.4 ml/min (by C-G formula based on Cr of 1.05).   Medical History: Past Medical History  Diagnosis Date  . Coronary atherosclerosis of native coronary artery     Mild at cateterization 2006  . Abdominal pain     Likely secondary to presbyesophagus and gastric dysmotility  . Chronic diastolic heart failure     June 2014 echo with EF 55-60% without wall motion abd but mod to severe AS  . Respiratory failure, chronic     Mixed etiology with bronchospastic component  . Urinary incontinence     Recurrent uti's/resistance to cipro, bactrim  . Gout   . Postablative hypothyroidism     H/o Graves disease s/p radioactive iodine ablation with resultant postablative hypothyroidism  . Hyperlipidemia   . Depression   . Morbidly obese     s/p gastric plication surgery  . Gastroesophageal reflux disease   . Ventral hernia     Repair in April 2008, complicated by MRSA abdominal wall cellulitis  . DVT (deep venous thrombosis) 1998  . Pulmonary embolism 1998    [place] filter  . Diabetes mellitus type 2, controlled, with complications   . Essential hypertension, benign   . AF (atrial fibrillation)     on chronic warfarin  .  Osteoarthritis   . Stroke 1997    Denies residual  . Anemia     Blood transfusion [V58.2]  . Supratherapeutic INR 05/15/2012    Possibly induced by steroid use.   . Chest pain at rest 08/07/2012   Warfarin PTA Dose:  5 mg daily except 2.5 mg on Wednesdays   Assessment: 79 y/o F here with fever/cough/SOB. Chronic warfarin for afib, INR is 1.53 this AM. Hgb 9.8, Plts 206, Scr 1.05, no overt bleeding noted. Noted DDI with Azithromycin.   Goal of Therapy:  INR 2-3 Monitor platelets by anticoagulation protocol: Yes   Plan:  -Warfarin 7.5 mg PO x 1 tonight at 1800, then hopefully back to PTA dose soon -Daily PT/INR -Monitor for bleeding  Thank you for allowing me to take part in this patient's care,  Abran Duke, PharmD Clinical Pharmacist Phone: [phone] Pager: [phone] 12/28/2012 8:00 AM

## 2012-12-29 ENCOUNTER — Inpatient Hospital Stay (HOSPITAL_COMMUNITY): Payer: Medicare PPO

## 2012-12-29 DIAGNOSIS — E119 Type 2 diabetes mellitus without complications: Secondary | ICD-10-CM

## 2012-12-29 DIAGNOSIS — R404 Transient alteration of awareness: Secondary | ICD-10-CM

## 2012-12-29 DIAGNOSIS — J9620 Acute and chronic respiratory failure, unspecified whether with hypoxia or hypercapnia: Secondary | ICD-10-CM

## 2012-12-29 DIAGNOSIS — J449 Chronic obstructive pulmonary disease, unspecified: Secondary | ICD-10-CM

## 2012-12-29 DIAGNOSIS — M109 Gout, unspecified: Secondary | ICD-10-CM

## 2012-12-29 DIAGNOSIS — I4891 Unspecified atrial fibrillation: Secondary | ICD-10-CM

## 2012-12-29 DIAGNOSIS — I129 Hypertensive chronic kidney disease with stage 1 through stage 4 chronic kidney disease, or unspecified chronic kidney disease: Secondary | ICD-10-CM

## 2012-12-29 LAB — GLUCOSE, CAPILLARY
Glucose-Capillary: 205 mg/dL — ABNORMAL HIGH (ref 70–99)
Glucose-Capillary: 248 mg/dL — ABNORMAL HIGH (ref 70–99)

## 2012-12-29 LAB — BASIC METABOLIC PANEL
BUN: 44 mg/dL — ABNORMAL HIGH (ref 6–23)
CO2: 35 mEq/L — ABNORMAL HIGH (ref 19–32)
CO2: 32 mEq/L (ref 19–32)
Calcium: 10.2 mg/dL (ref 8.4–10.5)
Calcium: 10.1 mg/dL (ref 8.4–10.5)
Chloride: 94 mEq/L — ABNORMAL LOW (ref 96–112)
Creatinine, Ser: 1.07 mg/dL (ref 0.50–1.10)
GFR calc Af Amer: 56 mL/min — ABNORMAL LOW (ref >90)
GFR calc Af Amer: 57 mL/min — ABNORMAL LOW (ref >90)
GFR calc non Af Amer: 48 mL/min — ABNORMAL LOW (ref >90)
Glucose, Bld: 308 mg/dL — ABNORMAL HIGH (ref 70–99)
Glucose, Bld: 256 mg/dL — ABNORMAL HIGH (ref 70–99)
Potassium: 4.2 mEq/L (ref 3.5–5.1)
Potassium: 4.1 mEq/L (ref 3.5–5.1)
Sodium: 135 mEq/L (ref 135–145)

## 2012-12-29 LAB — PROTIME-INR
INR: 1.31 (ref 0.00–1.49)
Prothrombin Time: 16 seconds — ABNORMAL HIGH (ref 11.6–15.2)

## 2012-12-29 LAB — MAGNESIUM
Magnesium: 2.2 mg/dL (ref 1.5–2.5)
Magnesium: 2.2 mg/dL (ref 1.5–2.5)

## 2012-12-29 LAB — LEGIONELLA ANTIGEN, URINE

## 2012-12-29 MED ORDER — WARFARIN SODIUM 7.5 MG PO TABS
7.5000 mg | ORAL_TABLET | Freq: Once | ORAL | Status: AC
  Administered 2012-12-29: 7.5 mg via ORAL
  Filled 2012-12-29: qty 1

## 2012-12-29 MED ORDER — PREDNISONE 20 MG PO TABS
40.0000 mg | ORAL_TABLET | Freq: Every day | ORAL | Status: DC
  Administered 2012-12-30 – 2013-01-01 (×3): 40 mg via ORAL
  Filled 2012-12-29 (×5): qty 2

## 2012-12-29 MED ORDER — FUROSEMIDE 80 MG PO TABS
80.0000 mg | ORAL_TABLET | Freq: Two times a day (BID) | ORAL | Status: DC
  Administered 2012-12-29 – 2013-01-01 (×7): 80 mg via ORAL
  Filled 2012-12-29 (×9): qty 1

## 2012-12-29 NOTE — Progress Notes (Signed)
  Date: 12/29/2012  Patient name: [name]  Medical record number: [id number]  Date of birth: 1932-09-19   This patient has been seen and the plan of care was discussed with the house staff. Please see their note for complete details. I concur with their findings with the following additions/corrections: [name] is better today - resp & mental status although she still has elements of a delirium / encephalopathy. Her CT was negative for an acute etiology so this is multifactorial from her poor baseline and her acute severe illness. Agree that she needs to stay in step down today and reassess in AM. May change steroids to PO today / tomorrow and change ABX to all PO. PT/OT eval.   Burns Spain, MD 12/29/2012, 11:36 AM

## 2012-12-29 NOTE — Progress Notes (Signed)
Patient has been engaged for [facility] services prior to this admission on 9.25.14.  She declined services maintaining that she can manage her health with home health services only.  We will refer her for COPD Gold services at this time.  Our care management services will be again offered if this referral is accepted.  Of note, [facility] Care Management services does not replace or interfere with any services that are arranged by inpatient case management or social work.  For additional questions or referrals please contact Anibal Henderson BSN RN [facility] Liaison at [phone].

## 2012-12-29 NOTE — Progress Notes (Signed)
Subjective: [name] is a 79 y.o. female presenting with COPD exacerbation.  NAE overnight. Feeling much better today. SOB resolving. O2 down to 2 L. A+o x 3  Objective: Vital signs in last 24 hours: Filed Vitals:   12/28/12 2032 12/29/12 0023 12/29/12 0442 12/29/12 0818  BP: 132/57 148/57 145/69 161/70  Pulse: 64  65 71  Temp: 98.2 F (36.8 C) 98 F (36.7 C) 98 F (36.7 C) 98.2 F (36.8 C)  TempSrc: Oral Oral Oral Oral  Resp: 20 15 12 16   Height:      Weight:   270 lb 11.6 oz (122.8 kg)   SpO2: 92% 94% 96% 96%   Weight change: -12 lb 4.4 oz (-5.568 kg)  Intake/Output Summary (Last 24 hours) at 12/29/12 0916 Last data filed at 12/29/12 0819  Gross per 24 hour  Intake     53 ml  Output   1000 ml  Net   -947 ml   Physical Exam  Nursing note and vitals reviewed.  Constitutional: She is oriented to person, place, and time and well-developed, well-nourished, and in no distress. No distress.  HENT:  Head: Normocephalic and atraumatic.  Eyes: Conjunctivae and EOM are normal. Pupils are equal, round, and reactive to light.  Cardiovascular: Normal rate and regular rhythm.  Murmur (3/6 SEM) heard.  Pulmonary/Chest: Effort normal. No respiratory distress. She has mild wheezing bil. She has no wheezes. She has no rales.  Abdominal: Soft. Bowel sounds are normal. She exhibits no distension. There is no tenderness. There is no rebound. A series of small non draining non purulent no erythematous wounds on the abdomen. Morbidly obese  Musculoskeletal: She exhibits no edema.  Feet:  Neurological: A+O x 3. Mildly confused. Skin: Skin is warm and dry. She is not diaphoretic.      Lab Results: Basic Metabolic Panel:  Recent Labs Lab 12/28/12 1630 12/29/12 0447  NA 135 135  K 4.2 4.2  CL 91* 91*  CO2 33* 35*  GLUCOSE 323* 308*  BUN 36* 39*  CREATININE 1.12* 1.07  CALCIUM 10.0 10.2  MG 2.1 2.2   Liver Function Tests:  Recent Labs Lab 12/28/12 0703  AST 28   ALT 17  ALKPHOS 120*  BILITOT 0.4  PROT 7.4  ALBUMIN 3.4*   Recent Labs Lab 12/28/12 1150  AMMONIA <10*   CBC:  Recent Labs Lab 12/28/12 0046  WBC 8.9  HGB 9.8*  HCT 33.7*  MCV 80.8  PLT 206   Cardiac Enzymes:  Recent Labs Lab 12/28/12 0047  TROPONINI <0.30   BNP:  Recent Labs Lab 12/28/12 0047  PROBNP 7922.0*   ABG: 7.39, 82, 57, 33  Studies/Results: Dg Chest 2 View  12/28/2012   CLINICAL DATA:  Shortness of breath, weakness, history coronary artery disease, hyperlipidemia, type 2 diabetes, essential benign hypertension, DVT, pulmonary embolism  EXAM: CHEST  2 VIEW  COMPARISON:  12/28/2012  FINDINGS: Enlargement of cardiac silhouette with pulmonary vascular congestion.  Chronic elevation of right diaphragm.  Calcified mildly tortuous thoracic aorta.  Peribronchial thickening with question perihilar infiltrates.  Improved aeration left lower lobe.  No gross pleural effusion or pneumothorax.  Bones demineralized.  IMPRESSION: Enlargement of cardiac silhouette with pulmonary vascular congestion.  Bronchitic changes with questionable mild perihilar infiltrates   Electronically Signed   By: Ulyses Southward M.D.   On: 12/28/2012 15:39   Dg Chest Port 1 View  12/28/2012   CLINICAL DATA:  Productive cough and shortness of breath.  EXAM: PORTABLE CHEST - 1 VIEW  COMPARISON:  CHEST x-ray 09/03/2012.  FINDINGS: Opacity at the base of the left hemithorax may reflect atelectasis and/or consolidation. Possible small left pleural effusion. Diffuse interstitial prominence. No definite cephalization of the pulmonary vasculature. Heart size appears mildly enlarged. The patient is rotated to the left on today's exam, resulting in distortion of the mediastinal contours and reduced diagnostic sensitivity and specificity for mediastinal pathology. Atherosclerosis in the thoracic aorta.  IMPRESSION: 1. Left retrocardiac opacity may reflect atelectasis and/or consolidation in the left lower lobe.  Possible small left pleural effusion. 2. Mild diffuse interstitial prominence is of uncertain etiology and significance, and may in part be technique related. 3. Mild cardiomegaly. 4. Atherosclerosis.   Electronically Signed   By: Trudie Reed M.D.   On: 12/28/2012 01:07   Medications: I have reviewed the patient's current medications. Scheduled Meds: . albuterol  2.5 mg Nebulization TID   And  . ipratropium  0.5 mg Nebulization TID  . allopurinol  100 mg Oral BID  . aspirin EC  81 mg Oral Daily  . atorvastatin  40 mg Oral Daily  . azithromycin  500 mg Oral Q24H  . cefTRIAXone (ROCEPHIN)  IV  1 g Intravenous Q24H  . FLUoxetine  20 mg Oral BID  . furosemide  80 mg Oral BID  . insulin aspart  0-15 Units Subcutaneous TID WC  . levothyroxine  150 mcg Oral QAC breakfast  . methylPREDNISolone (SOLU-MEDROL) injection  125 mg Intravenous Q12H  . mometasone-formoterol  2 puff Inhalation BID  . montelukast  10 mg Oral Daily  . pantoprazole  40 mg Oral Daily  . sodium chloride  3 mL Intravenous Q12H  . warfarin  7.5 mg Oral ONCE-1800  . Warfarin - Pharmacist Dosing Inpatient   Does not apply q1800   Continuous Infusions:  PRN Meds:.sodium chloride, acetaminophen, albuterol, Ipratropium-Albuterol, sodium chloride Assessment/Plan: Principal Problem:   CAP (community acquired pneumonia) Active Problems:   HYPOTHYROIDISM   DIABETES MELLITUS   HYPERLIPIDEMIA   GOUT   OBESITY, MORBID   HYPERTENSION   Atrial fibrillation   COPD   CAD (coronary artery disease)   Aortic stenosis   Hypertensive urgency   Encephalopathy acute   Chronic respiratory failure   COPD exacerbation  Assessment & Plan by Problem:  Ms. Burdine is a 79yo F with history of HF c/b AS and AR, pulm HTN, gout, afib on a/c, DM and chronic respiratory failure on 4L home O2 admitted on 12/28/12 with complaints of SOB with sputum producing cough.   #COPD exacerbation c/b CAP: Based on symptoms and evidenced by CXR.  Saturating well on home O2. At admission, CURB 65 score is 2, suggesting 9% risk of death.  - Admit to SDU - Cont Ceftriaxone/Azithro x 7 d  - Follow sputum cx, blood cx, legionella & strep AG  - Titrate O2 to >92% - Methylprednisolone 125 mg BID - F/U 2 view CXR  #Acute Encephalopathy May be 2/2 to COPD exacerbation and hypercarbia (57, baseline 39). Cannot exclude CVA, hepatic encephalopathy, or UTI at this time. Will f/u CT head    #Hypertensive Urgency: In the setting of history of HFPEF; BP 240/200 at admission. Likely due to fluid overload from missing meds today & acute stress; treated with lasix gtt 100mg  in ED.  - Restart home lasix 80mg  PO BID - BMET q12 to monitor K & Mg   #Gout: Appears to have acute flare in setting of acute infection  -Cont  allopurinol, as patient already on for ppx  -Treat with Colchicine (CrCl 33, no adjustment in dose --> 1.2mg  now, then 0.6mg  one hour later)   #Afib: rate controlled/bradycardic (chronic with h/o SSS, junctional rhythm on EKG at admission)  -Follow INR  -Cont coumadin per pharmacy   #DM: Last HbA1c 6.7 on 11/10/12. Hold home glipizide during hospitalization  -SSI mod   #Hypothyroidism: Last TSH 4.14 (wnl) on 06/26/12  -Cont home dose synthroid   #Stage 3 CKD: At baseline, continue to monitor   #Normocytic Anemia: Stable (baseline ~10), monitor as needed   #VTE ppx: on coumadin   #Code Status: Full Code   Dispo: Disposition is deferred at this time, awaiting improvement of current medical problems.  Anticipated discharge in approximately 2-3 day(s).   The patient does have a current PCP (Aletta Edouard, MD) and does need an [facility] hospital follow-up appointment after discharge.  The patient does have transportation limitations that hinder transportation to clinic appointments.  .Services Needed at time of discharge: Y = Yes, Blank = No PT:   OT:   RN:   Equipment:   Other:     LOS: 1 day   Pleas Koch,  MD 12/29/2012, 9:16 AM

## 2012-12-29 NOTE — Care Management Note (Signed)
    Page 1 of 1   12/29/2012     2:53:35 PM   CARE MANAGEMENT NOTE 12/29/2012  Patient:  [name], [name]   Account Number:  [id number]  Date Initiated:  12/28/2012  Documentation initiated by:  Moet Mikulski  Subjective/Objective Assessment:   adm with dx of HTN urgency/CAP; lives with son, has home O2 through [employer], aide through [facility] 2 hrs/day, 5 days/wk    PCP  Dr Aletta Edouard     DC Planning Services  CM consult   Per UR Regulation:  Reviewed for med. necessity/level of care/duration of stay

## 2012-12-29 NOTE — Progress Notes (Signed)
PULMONARY  / CRITICAL CARE MEDICINE  Name: [name] MRN: [id number] DOB: [birth year]    ADMISSION DATE:  12/28/2012 CONSULTATION DATE:  12/28/2012  REFERRING MD :  Danise Edge PRIMARY SERVICE: IMTS  CHIEF COMPLAINT:  Acute encephalopathy  BRIEF PATIENT DESCRIPTION: 79 yo with multiple co-morbidities, presented to [facility] ED with dyspnea, productive cough, fever/chills x 4 days. Hypertensive (240/200) on admission.  Developed altered mental status and PCCM was consulted to evaluate.  SIGNIFICANT EVENTS / STUDIES:  11/3  Admitted with dyspnea and hypertension 11/4  Head CT >>> nad  LINES / TUBES:  CULTURES: 11/3 Sputum >>> 11/3 Blood >>>  ANTIBIOTICS: Azithromycin 11/3 >>> Ceftriaxone 11/3 >>  SUBJECTIVE:  "I feel better than yesterday".  Pt states she feels as if her breathing is much improved.  Denies any chest pain, fevers, chills, sweats, cough.  Rested well overnight.  She is now able to tell me what her full name is and that she is at [facility] hospital after being admitted for difficulty breathing (unable to do so yesterday).  VITAL SIGNS: Temp:  [98 F (36.7 C)-98.7 F (37.1 C)] 98.2 F (36.8 C) (11/04 0818) Pulse Rate:  [57-71] 71 (11/04 0818) Resp:  [12-20] 16 (11/04 0818) BP: (118-161)/(43-77) 161/70 mmHg (11/04 0818) SpO2:  [92 %-100 %] 96 % (11/04 0818) Weight:  [270 lb 11.6 oz (122.8 kg)] 270 lb 11.6 oz (122.8 kg) (11/04 0442)    INTAKE / OUTPUT: Intake/Output     11/03 0701 - 11/04 0700 11/04 0701 - 11/05 0700   I.V. (mL/kg) 3 (0)    IV Piggyback 50    Total Intake(mL/kg) 53 (0.4)    Urine (mL/kg/hr) 1300 (0.4) 200 (1)   Total Output 1300 200   Net -1247 -200        Stool Occurrence 1 x 1 x    PHYSICAL EXAMINATION: General:  Obese female, sitting up in chair eating breakfast, in NAD. Neuro:  Awake, oriented to person and place but not time.  HEENT:  [place]/AT.  PERRL.  MMM. Cardiovascular:  RRR, no M/R/G. Lungs:  Resp's even and unlabored.  Decreased breath  sounds in LLL.  No W/R/R. Abdomen:  BS x 4.  Soft, NT/ND. Musculoskeletal:  No gross deformities.  No edema.  Tenderness in right ankle; however, improved from yesterday. Skin:  Warm and dry.   Recent Labs Lab 12/28/12 0046 12/28/12 0047 12/28/12 0615 12/28/12 0703 12/28/12 0800 12/28/12 1630 12/29/12 0447  HGB 9.8*  --   --   --   --   --   --   WBC 8.9  --   --   --   --   --   --   PLT 206  --   --   --   --   --   --   NA 138  --   --  140  --  135 135  K 4.3  --   --  3.8  --  4.2 4.2  CL 95*  --   --  95*  --  91* 91*  CO2 36*  --   --  33*  --  33* 35*  GLUCOSE 135*  --   --  294*  --  323* 308*  BUN 30*  --   --  34*  --  36* 39*  CREATININE 1.05  --   --  1.10  --  1.12* 1.07  CALCIUM 10.1  --   --  10.0  --  10.0 10.2  MG  --   --   --  2.1  --  2.1 2.2  AST  --   --   --  28  --   --   --   ALT  --   --   --  17  --   --   --   ALKPHOS  --   --   --  120*  --   --   --   BILITOT  --   --   --  0.4  --   --   --   PROT  --   --   --  7.4  --   --   --   ALBUMIN  --   --   --  3.4*  --   --   --   INR  --   --  1.53*  --   --   --  1.31  TROPONINI  --  <0.30  --   --   --   --   --   PHART  --   --   --   --  7.390  --   --   PCO2ART  --   --   --   --  57.2*  --   --   PO2ART  --   --   --   --  82.0  --   --   HCO3  --   --   --   --  33.9*  --   --   O2SAT  --   --   --   --  95.7  --   --     Recent Labs Lab 12/28/12 1212 12/28/12 1801 12/28/12 2031 12/29/12 0817 12/29/12 1229  GLUCAP 252* 271* 245* 303* 205*   CXR:  None today  ASSESSMENT / PLAN:  Pneumonia (CAP) COPD, with acute exacerbation Chronic hypoxemic / hypercarbic respiratory failure (CO2 appears at baseline) Acute encephalopathy, likely delirium, resolved now, negative head CT Hyperglycemia - exacerbated by steroids  -->  Continue Azithromycin / Ceftriaxone for total of 7 days -->  Follow cultures -->  Supplemental oxygen, goal SpO2>92% -->  Agree with bronchodilators  -->   Solu-Medrol d/c'd -->  Prednisone 40 started -->  Trend CXR -->  Hold Neurontin and avoid benzodiazepines -->  Glycemic control per primary team -->  Will follow  Rutherford Guys, PA - S  I have personally obtained history, examined patient, evaluated and interpreted laboratory and imaging results, reviewed medical records, formulated assessment / plan and placed orders.  Lonia Farber, MD Pulmonary and Critical Care Medicine [facility] Pager: [phone]  12/29/2012, 5:23 PM

## 2012-12-29 NOTE — Progress Notes (Signed)
Subjective: No acute events overnight. Patient continues to improve and appears to be much more oriented today (AAOX3). She is still requiring 2L [place]. She reports that her SOB has also improved.   Objective: Vital signs in last 24 hours: Filed Vitals:   12/29/12 0818 12/29/12 0830 12/29/12 0929 12/29/12 1200  BP: 161/70 156/59  128/54  Pulse: 71 85  60  Temp: 98.2 F (36.8 C)   98.2 F (36.8 C)  TempSrc: Oral   Oral  Resp: 16 20  16   Height:      Weight:      SpO2: 96% 95% 99% 95%   Weight change: -5.568 kg (-12 lb 4.4 oz)  Intake/Output Summary (Last 24 hours) at 12/29/12 1323 Last data filed at 12/29/12 1225  Gross per 24 hour  Intake    580 ml  Output   1150 ml  Net   -570 ml   General appearance: alert, no distress, slowed mentation and still showing signs of mild delirium/confusion.  Head: Normocephalic, without obvious abnormality, atraumatic Eyes: PERRL, conjunctiva clear, EOMI Neck: no adenopathy, no carotid bruit, no JVD, supple, symmetrical, trachea midline and thyroid not enlarged, symmetric, no tenderness/mass/nodules Lungs: wheezes bilaterally Heart: regular rate and rhythm, S1, S2 normal, no murmur, click, rub or gallop Abdomen: soft, non-tender; bowel sounds normal; no masses,  no organomegaly Extremities: warm and well perfused minimal 1+ edema noted BLE.  Lab Results: @LABTEST2 @ Micro Results: Recent Results (from the past 240 hour(s))  CULTURE, BLOOD (ROUTINE X 2)     Status: None   Collection Time    12/28/12  1:25 AM      Result Value Range Status   Specimen Description BLOOD RIGHT HAND   Final   Special Requests BOTTLES DRAWN AEROBIC AND ANAEROBIC 5CC EACH   Final   Culture  Setup Time     Final   Value: 12/28/2012 06:41     Performed at [facility]   Culture     Final   Value:        BLOOD CULTURE RECEIVED NO GROWTH TO DATE CULTURE WILL BE HELD FOR 5 DAYS BEFORE ISSUING A FINAL NEGATIVE REPORT     Performed at [facility]   Report Status PENDING   Incomplete  CULTURE, BLOOD (ROUTINE X 2)     Status: None   Collection Time    12/28/12  1:35 AM      Result Value Range Status   Specimen Description BLOOD LEFT HAND   Final   Special Requests BOTTLES DRAWN AEROBIC AND ANAEROBIC [facility] EACH   Final   Culture  Setup Time     Final   Value: 12/28/2012 06:41     Performed at [facility]   Culture     Final   Value:        BLOOD CULTURE RECEIVED NO GROWTH TO DATE CULTURE WILL BE HELD FOR 5 DAYS BEFORE ISSUING A FINAL NEGATIVE REPORT     Performed at [facility]   Report Status PENDING   Incomplete  MRSA PCR SCREENING     Status: None   Collection Time    12/28/12  8:06 AM      Result Value Range Status   MRSA by PCR NEGATIVE  NEGATIVE Final   Comment:            The GeneXpert MRSA Assay (FDA     approved for NASAL specimens     only), is one component  of a     comprehensive MRSA colonization     surveillance program. It is not     intended to diagnose MRSA     infection nor to guide or     monitor treatment for     MRSA infections.   Studies/Results: Dg Chest 2 View  12/28/2012   CLINICAL DATA:  Shortness of breath, weakness, history coronary artery disease, hyperlipidemia, type 2 diabetes, essential benign hypertension, DVT, pulmonary embolism  EXAM: CHEST  2 VIEW  COMPARISON:  12/28/2012  FINDINGS: Enlargement of cardiac silhouette with pulmonary vascular congestion.  Chronic elevation of right diaphragm.  Calcified mildly tortuous thoracic aorta.  Peribronchial thickening with question perihilar infiltrates.  Improved aeration left lower lobe.  No gross pleural effusion or pneumothorax.  Bones demineralized.  IMPRESSION: Enlargement of cardiac silhouette with pulmonary vascular congestion.  Bronchitic changes with questionable mild perihilar infiltrates   Electronically Signed   By: Ulyses Southward M.D.   On: 12/28/2012 15:39   Ct Head Wo Contrast  12/29/2012   CLINICAL DATA:  History of altered  and decreased mental status. History of acute encephalopathy.  EXAM: CT HEAD WITHOUT CONTRAST  TECHNIQUE: Contiguous axial images were obtained from the base of the skull through the vertex without intravenous contrast.  COMPARISON:  CT 10/02/2010.  FINDINGS: There is no evidence of brain mass, brain hemorrhage, or acute infarction. There is hypodensity consistent with previous remote infarction and encephalomalacia in the left frontal region. Moderate atrophic changes are seen. Stable periventricular and white matter hypodensity microvascular ischemic changes are seen. Non aneurysmal arterial calcifications are present.  The ventricular system is normal size and shape. There is no evidence of shift of midline structures, parenchymal lesion, or subdural or epidural hematoma.  The calvarium is intact. Mastoids are well aerated. There is minimal bilateral maxillary mucosal thickening right greater than left.  IMPRESSION: There is no evidence of brain mass, brain hemorrhage, or acute infarction. There is hypodensity consistent with previous remote infarction and encephalomalacia in the left frontal region. Moderate atrophic changes are seen. Stable periventricular and white matter hypodensity microvascular ischemic changes are seen. Non aneurysmal arterial calcifications are present.  No acute or active brain process is seen. No skull lesion is evident. There is minimal bilateral maxillary mucosal thickening right greater than left.   Electronically Signed   By: Onalee Hua  Call M.D.   On: 12/29/2012 11:02   Dg Chest Port 1 View  12/28/2012   CLINICAL DATA:  Productive cough and shortness of breath.  EXAM: PORTABLE CHEST - 1 VIEW  COMPARISON:  CHEST x-ray 09/03/2012.  FINDINGS: Opacity at the base of the left hemithorax may reflect atelectasis and/or consolidation. Possible small left pleural effusion. Diffuse interstitial prominence. No definite cephalization of the pulmonary vasculature. Heart size appears mildly  enlarged. The patient is rotated to the left on today's exam, resulting in distortion of the mediastinal contours and reduced diagnostic sensitivity and specificity for mediastinal pathology. Atherosclerosis in the thoracic aorta.  IMPRESSION: 1. Left retrocardiac opacity may reflect atelectasis and/or consolidation in the left lower lobe. Possible small left pleural effusion. 2. Mild diffuse interstitial prominence is of uncertain etiology and significance, and may in part be technique related. 3. Mild cardiomegaly. 4. Atherosclerosis.   Electronically Signed   By: Trudie Reed M.D.   On: 12/28/2012 01:07   Medications: I have reviewed the patient's current medications. Scheduled Meds: . albuterol  2.5 mg Nebulization TID   And  . ipratropium  0.5 mg  Nebulization TID  . allopurinol  100 mg Oral BID  . aspirin EC  81 mg Oral Daily  . atorvastatin  40 mg Oral Daily  . azithromycin  500 mg Oral Q24H  . cefTRIAXone (ROCEPHIN)  IV  1 g Intravenous Q24H  . FLUoxetine  20 mg Oral BID  . furosemide  80 mg Oral BID  . insulin aspart  0-15 Units Subcutaneous TID WC  . levothyroxine  150 mcg Oral QAC breakfast  . methylPREDNISolone (SOLU-MEDROL) injection  125 mg Intravenous Q12H  . mometasone-formoterol  2 puff Inhalation BID  . montelukast  10 mg Oral Daily  . pantoprazole  40 mg Oral Daily  . sodium chloride  3 mL Intravenous Q12H  . warfarin  7.5 mg Oral ONCE-1800  . Warfarin - Pharmacist Dosing Inpatient   Does not apply q1800   Continuous Infusions:  PRN Meds:.sodium chloride, acetaminophen, albuterol, Ipratropium-Albuterol, sodium chloride Assessment/Plan:  [name] is a 79 yo F with past medical history significant for DM, COPD on 4L home O2, HF, AS, Gout, hypothyroidism, A Fib and hyperlipidemia who presents with COPD exacerbation with SOB and productive cough.   Principal Problem:   CAP (community acquired pneumonia) Active Problems:   HYPOTHYROIDISM   DIABETES MELLITUS    HYPERLIPIDEMIA   GOUT   OBESITY, MORBID   HYPERTENSION   Atrial fibrillation   COPD   CAD (coronary artery disease)   Aortic stenosis   Hypertensive urgency   Encephalopathy acute   Chronic respiratory failure   COPD exacerbation  #COPD Exacerbation: Patient continues to improve. She has been weaned to 2L [place] and her sats have remained >92%.  - Continue Combivent as needed.  - Continue Ceftriaxone/Azithromycin for 7 days for pneumonia coverage.  - Continue to follow cultures (blood, urine, sputum) - Maintain O2 Sat>92% - Continue Methylprednisolone 125mg  BID IV plan to convert to PO in the AM.  - F/U CXR  #Acute Encephalopathy: Patient confusion has improved over the last 24 hours but continues to have mild confusion. She is much better oriented today compared to yesterday. Confusion likely due to hypercarbia on admission. CT scan completed on 12/29/12 with unremarkable findings.    #Hypertensive Urgency: Patient's vital signs have remained stable since admission. Hypertension on admission likely related to fluid retention after missing lasix. Patient treated in ED with Lasix 100mg . - Restart home Lasix 80 mg PO BID - Contine to monitor BMP q 12. (Monitor K and Mg)  #Gout: Acute flare - Continue home dose of allopurinol   #Atrial Fibrillation: Patient has continued be rate controlled with bradycardia and RBBB - Continue home dose of Coumadin.   #DM: Blood glucose continues to be elevated (200-300s). Expected in the setting of infection and the administration of steroids (plan to covert to PO steroids in AM).  - Holding home medication of glipizide.  - Continue Insulin sliding scale TID   #Hypothyroidism: TSH level normal 06/26/12 (4.14) - Continue home synthroid  #CKD: Creatinine stable at 1.07 - Continue to monitor.   Prophylaxis:  - Continue home Coumadin  Dispostion: Stepdown status with likely transfer to floor once acute encephalopathy improves.    This is a Ad[employer] account executive Note.  The care of the patient was discussed with Dr. Glendell Docker the assessment and plan formulated with their assistance.  Please see their attached note for official documentation of the daily encounter.   LOS: 1 day  Lalla Brothers Medical Student MS3

## 2012-12-29 NOTE — Progress Notes (Signed)
ANTICOAGULATION CONSULT NOTE - Follow-up  Pharmacy Consult for Warfarin  Indication: atrial fibrillation  Allergies  Allergen Reactions  . Lantus [Insulin Glargine]     Causes a lot of itching  . Nitrofurantoin [Macrodantin] Itching    Patient Measurements: Height: 5\' 1"  (154.9 cm) Weight: 270 lb 11.6 oz (122.8 kg) IBW/kg (Calculated) : 47.8  Vital Signs: Temp: 98.2 F (36.8 C) (11/04 0818) Temp src: Oral (11/04 0818) BP: 161/70 mmHg (11/04 0818) Pulse Rate: 71 (11/04 0818)  Labs:  Recent Labs  12/28/12 0046 12/28/12 0047 12/28/12 0615 12/28/12 0703 12/28/12 1630 12/29/12 0447  HGB 9.8*  --   --   --   --   --   HCT 33.7*  --   --   --   --   --   PLT 206  --   --   --   --   --   LABPROT  --   --  18.0*  --   --  16.0*  INR  --   --  1.53*  --   --  1.31  CREATININE 1.05  --   --  1.10 1.12* 1.07  TROPONINI  --  <0.30  --   --   --   --     Estimated Creatinine Clearance: 53.2 ml/min (by C-G formula based on Cr of 1.07).  Assessment: 79 y/o F here with fever/cough/SOB. She is on chronic warfarin for afib. INR is trending down to 1.31 today. No new CBC today, no bleeding noted.   Goal of Therapy:  INR 2-3   Plan:  1. Repeat warfarin 7.5mg  PO x 1 tonight 2. F/u AM INR  Lysle Pearl, PharmD, BCPS Pager # [phone] 12/29/2012 8:45 AM

## 2012-12-29 NOTE — Progress Notes (Signed)
Inpatient Diabetes Program Recommendations  AACE/ADA: New Consensus Statement on Inpatient Glycemic Control (2013)  Target Ranges:  Prepandial:   less than 140 mg/dL      Peak postprandial:   less than 180 mg/dL (1-2 hours)      Critically ill patients:  140 - 180 mg/dL  Results for MARKI, FREDE (MRN [id number]) as of 12/29/2012 09:45  Ref. Range 12/28/2012 08:00 12/28/2012 12:12 12/28/2012 18:01 12/28/2012 20:31 12/29/2012 08:17  Glucose-Capillary Latest Range: 70-99 mg/dL 409 (H) 811 (H) 914 (H) 245 (H) 303 (H)    Inpatient Diabetes Program Recommendations Insulin - Basal: consider adding basal insulin during steroid therapy  Correction (SSI): consider increasing to resistant  Thank you  Piedad Climes BSN, RN,CDE Inpatient Diabetes Coordinator [phone] (team pager)

## 2012-12-30 DIAGNOSIS — E039 Hypothyroidism, unspecified: Secondary | ICD-10-CM

## 2012-12-30 DIAGNOSIS — I5033 Acute on chronic diastolic (congestive) heart failure: Secondary | ICD-10-CM

## 2012-12-30 DIAGNOSIS — I509 Heart failure, unspecified: Secondary | ICD-10-CM

## 2012-12-30 DIAGNOSIS — N189 Chronic kidney disease, unspecified: Secondary | ICD-10-CM

## 2012-12-30 DIAGNOSIS — I129 Hypertensive chronic kidney disease with stage 1 through stage 4 chronic kidney disease, or unspecified chronic kidney disease: Secondary | ICD-10-CM

## 2012-12-30 LAB — BASIC METABOLIC PANEL
BUN: 47 mg/dL — ABNORMAL HIGH (ref 6–23)
BUN: 47 mg/dL — ABNORMAL HIGH (ref 6–23)
CO2: 31 mEq/L (ref 19–32)
Calcium: 10.1 mg/dL (ref 8.4–10.5)
Chloride: 94 mEq/L — ABNORMAL LOW (ref 96–112)
Chloride: 92 mEq/L — ABNORMAL LOW (ref 96–112)
Creatinine, Ser: 1.07 mg/dL (ref 0.50–1.10)
GFR calc Af Amer: 56 mL/min — ABNORMAL LOW (ref >90)
Glucose, Bld: 312 mg/dL — ABNORMAL HIGH (ref 70–99)
Potassium: 4.3 mEq/L (ref 3.5–5.1)
Sodium: 136 mEq/L (ref 135–145)

## 2012-12-30 LAB — GLUCOSE, CAPILLARY
Glucose-Capillary: 295 mg/dL — ABNORMAL HIGH (ref 70–99)
Glucose-Capillary: 230 mg/dL — ABNORMAL HIGH (ref 70–99)

## 2012-12-30 LAB — PROTIME-INR
INR: 1.62 — ABNORMAL HIGH (ref 0.00–1.49)
Prothrombin Time: 18.8 seconds — ABNORMAL HIGH (ref 11.6–15.2)

## 2012-12-30 LAB — MAGNESIUM: Magnesium: 2.2 mg/dL (ref 1.5–2.5)

## 2012-12-30 MED ORDER — POLYETHYLENE GLYCOL 3350 17 G PO PACK
17.0000 g | PACK | Freq: Every day | ORAL | Status: DC
  Administered 2012-12-31 – 2013-01-01 (×2): 17 g via ORAL
  Filled 2012-12-30 (×3): qty 1

## 2012-12-30 MED ORDER — WARFARIN SODIUM 7.5 MG PO TABS
7.5000 mg | ORAL_TABLET | Freq: Once | ORAL | Status: AC
  Administered 2012-12-30: 18:00:00 7.5 mg via ORAL
  Filled 2012-12-30: qty 1

## 2012-12-30 MED ORDER — GLIPIZIDE 10 MG PO TABS: 10.0000 mg | ORAL_TABLET | Freq: Two times a day (BID) | ORAL | Status: DC

## 2012-12-30 MED ORDER — POLYETHYLENE GLYCOL 3350 17 GM/SCOOP PO POWD
17.0000 g | Freq: Every day | ORAL | Status: DC | PRN
  Filled 2012-12-30: qty 255

## 2012-12-30 MED ORDER — GLIPIZIDE 10 MG PO TABS
10.0000 mg | ORAL_TABLET | Freq: Two times a day (BID) | ORAL | Status: DC
  Administered 2012-12-30 – 2013-01-01 (×4): 10 mg via ORAL
  Filled 2012-12-30 (×6): qty 1

## 2012-12-30 NOTE — Progress Notes (Signed)
ANTICOAGULATION CONSULT NOTE - Follow-up  Pharmacy Consult for Warfarin  Indication: atrial fibrillation  Allergies  Allergen Reactions  . Lantus [Insulin Glargine]     Causes a lot of itching  . Nitrofurantoin [Macrodantin] Itching    Patient Measurements: Height: 5\' 1"  (154.9 cm) Weight: 270 lb 11.6 oz (122.8 kg) IBW/kg (Calculated) : 47.8  Vital Signs: Temp: 98.1 F (36.7 C) (11/05 0423) Temp src: Oral (11/05 0423) BP: 131/49 mmHg (11/05 0423) Pulse Rate: 63 (11/05 0423)  Labs:  Recent Labs  12/28/12 0046 12/28/12 0047 12/28/12 0615  12/29/12 0447 12/29/12 1724 12/30/12 0610  HGB 9.8*  --   --   --   --   --   --   HCT 33.7*  --   --   --   --   --   --   PLT 206  --   --   --   --   --   --   LABPROT  --   --  18.0*  --  16.0*  --  18.8*  INR  --   --  1.53*  --  1.31  --  1.62*  CREATININE 1.05  --   --   < > 1.07 1.06 1.02  TROPONINI  --  <0.30  --   --   --   --   --   < > = values in this interval not displayed.  Estimated Creatinine Clearance: 55.8 ml/min (by C-G formula based on Cr of 1.02).  Assessment: 79 y/o F admitted 12/28/2012  with fever/cough/SOB.  CC: dyspnea, productive cough, fever/chills x 4 days. Hypertensive (240/200) on admission.  Pharmacy consulted to continue warfarin.  PMH: CAD, CHF, gout, hypothyroid, HLD, depression, DVT, PE, DM, HTN, afib, OA, anemia, GERD, CVA  AC: afib, DVT, PE - INR 1.31, no new CBC today, no bleeding noted - also on azithromycin (med history not done, last coumadin clinic note indicates dose should be 5mg  daily except for 2.5mg  on Wed);INR is trending up today. No new CBC today, no bleeding noted.    ID: CTX/azithro D#3 for PNA - Afebrile, WBC WNL as of 11/3, doses good  Azithromycin 11/3>(11/10) CTX 11/3>(11/10) (no active order)  11/3 Blood - NGTD Goal of Therapy:  INR 2-3   Plan:  1. Repeat warfarin 7.5mg  PO x 1 tonight, INR trend difficult to ascertain, will repeat 1.5x home dose. 2. F/u AM  INR 3. Follow up plan for antibiotics  Thank you for allowing pharmacy to be a part of this patients care team.  Lovenia Kim Pharm.D., BCPS Clinical Pharmacist 12/30/2012 8:49 AM Pager: [phone] Phone: [phone]

## 2012-12-30 NOTE — Progress Notes (Signed)
Physical Therapy Evaluation Patient Details Name: [name] MRN: [id number] DOB: [birth year] Today's Date: 12/30/2012 Time: 4098-1191 PT Time Calculation (min): 34 min  PT Assessment / Plan / Recommendation History of Present Illness  Pt admit with COPD exacerbation.    Clinical Impression  Pt admitted with above. Pt currently with functional limitations due to the deficits listed below (see PT Problem List). Pt appears at baseline per her report.  Pt was not confused while PT evaluated her.  Once PT came out of room, spoke with nursing about pt and nursing stated that pt gets more and more confused as the day goes on.  PT has had 4 admissions in last 6 months therefore question whether higher level of care may be more appropriate as PT/OT question whether pt was really functioning well PTA.  REcommend SNF with therapy as ultimate d/c plan.  Pt will benefit from skilled PT to increase their independence and safety with mobility to allow discharge to the venue listed below.    PT Assessment  Patient needs continued PT services    Follow Up Recommendations  Supervision/Assistance - 24 hour;SNF         Barriers to Discharge Decreased caregiver support (does not have 24 hour assist)      Equipment Recommendations  None recommended by PT         Frequency Min 3X/week    Precautions / Restrictions Precautions Precautions: Fall Restrictions Weight Bearing Restrictions: No   Pertinent Vitals/Pain VSS, No pain      Mobility  Bed Mobility Bed Mobility: Rolling Right;Right Sidelying to Sit;Sitting - Scoot to [place] of Bed Rolling Right: 4: Min assist Right Sidelying to Sit: 3: Mod assist;HOB elevated Sitting - Scoot to Edge of Bed: 5: Supervision Details for Bed Mobility Assistance: Pt needed assist to get to EOB.  Pt states she uses lift chair at home.  Transfers Transfers: Sit to Stand;Stand to Sit Sit to Stand: 5: Supervision;With upper extremity assist;From bed Stand to  Sit: 5: Supervision;With upper extremity assist;To bed Details for Transfer Assistance: Pt with good technique for sit to stand and did not need physical assist.  Ambulation/Gait Ambulation/Gait Assistance: 4: Min guard Ambulation Distance (Feet): 15 Feet Assistive device: Rolling walker Ambulation/Gait Assistance Details: Pt ambulated in room with some cues for safety with RW.  Feel pt is at baseline.   Gait Pattern: Step-through pattern;Decreased stride length;Wide base of support;Trunk flexed Gait velocity: Moves quickly Stairs: No Wheelchair Mobility Wheelchair Mobility: No         PT Diagnosis: Generalized weakness  PT Problem List: Decreased activity tolerance;Decreased balance;Decreased mobility;Decreased knowledge of use of DME;Decreased safety awareness;Decreased strength;Decreased knowledge of precautions PT Treatment Interventions: DME instruction;Gait training;Functional mobility training;Therapeutic activities;Therapeutic exercise;Balance training;Patient/family education     PT Goals(Current goals can be found in the care plan section) Acute Rehab PT Goals Patient Stated Goal: to go home PT Goal Formulation: With patient Time For Goal Achievement: 01/06/13 Potential to Achieve Goals: Good  Visit Information  Last PT Received On: 12/30/12 Assistance Needed: +1 History of Present Illness: Pt admit with COPD exacerbation.         Prior Functioning  Home Living Family/patient expects to be discharged to:: Private residence Living Arrangements: Children Available Help at Discharge: Personal care attendant (930-2 M-Sat) Type of Home: House Home Access: Stairs to enter [employer] of Steps: 2 Entrance Stairs-Rails: Can reach both Home Layout: One level Home Equipment: Walker - 2 wheels;Shower seat;Bedside commode (2LO2 home O2; lift  chair) Prior Function Level of Independence: Independent with assistive device(s) Comments: Usually ambulates at Mod I  with RW, aide is there 6 days a week 9am-1pm and son is there to help as need on the 7th day Communication Communication: No difficulties Dominant Hand: Right    Cognition  Cognition Arousal/Alertness: Awake/alert Behavior During Therapy: WFL for tasks assessed/performed Overall Cognitive Status: Within Functional Limits for tasks assessed    Extremity/Trunk Assessment Upper Extremity Assessment Upper Extremity Assessment: Defer to OT evaluation Lower Extremity Assessment Lower Extremity Assessment: RLE deficits/detail;LLE deficits/detail RLE Deficits / Details: grossly 3/5 LLE Deficits / Details: grossly 3/5   Balance Balance Balance Assessed: Yes Static Standing Balance Static Standing - Balance Support: Bilateral upper extremity supported;During functional activity Static Standing - Level of Assistance: 5: Stand by assistance Static Standing - Comment/# of Minutes: 2 High Level Balance High Level Balance Activites: Turns;Sudden stops;Direction changes High Level Balance Comments: Can perform with RW with supervision.  End of Session PT - End of Session Equipment Utilized During Treatment: Gait belt;Oxygen Activity Tolerance: Patient limited by lethargy Patient left: in bed;with call bell/phone within reach;with bed alarm set Nurse Communication: Mobility status       [name] 12/30/2012, 10:47 AM [facility] [phone] (pager)

## 2012-12-30 NOTE — Progress Notes (Signed)
PULMONARY  / CRITICAL CARE MEDICINE  Name: [name] MRN: [id number] DOB: 12-11-1932    ADMISSION DATE:  12/28/2012 CONSULTATION DATE:  12/28/2012  REFERRING MD :  Danise Edge PRIMARY SERVICE: IMTS  CHIEF COMPLAINT:  Acute encephalopathy  BRIEF PATIENT DESCRIPTION: 79 yo with multiple co-morbidities, presented to [facility] ED with dyspnea, productive cough, fever/chills x 4 days. Hypertensive (240/200) on admission.  Developed altered mental status and PCCM was consulted to evaluate.  SIGNIFICANT EVENTS / STUDIES:  11/3  Admitted with dyspnea and hypertension 11/4  Head CT >>> nad  LINES / TUBES:  CULTURES: 11/3 Sputum >>> 11/3 Blood >>>ng  ANTIBIOTICS: Azithromycin 11/3 >>> Ceftriaxone 11/3 >>> 11/5  SUBJECTIVE:  Pt reports she feels much better from admission but still somewhat short of breath.  Has had wheezing on and off since last night.  Otherwise no new concerns.  Did not get much sleep overnight as she had trouble getting comfortable.  Orientation improved today, is now able to tell me what year it is as well as her full name and why she is in the hospital.  Denies chest pain, cough, N/V, fevers, chills, sweats.  VITAL SIGNS: Temp:  [98.1 F (36.7 C)-98.5 F (36.9 C)] 98.1 F (36.7 C) (11/05 0423) Pulse Rate:  [60-78] 63 (11/05 0423) Resp:  [14-23] 14 (11/05 0423) BP: (95-158)/(48-125) 131/49 mmHg (11/05 0423) SpO2:  [92 %-100 %] 92 % (11/05 0423)    INTAKE / OUTPUT: Intake/Output     11/04 0701 - 11/05 0700 11/05 0701 - 11/06 0700   P.O. 480    I.V. (mL/kg) 119 (1)    IV Piggyback 50    Total Intake(mL/kg) 649 (5.3)    Urine (mL/kg/hr) 1250 (0.4) 275 (1.2)   Total Output 1250 275   Net -601 -275        Stool Occurrence 3 x     PHYSICAL EXAMINATION: General:  Obese female, lying in bed eating breakfast, in NAD. Neuro:  Awake, oriented to person/place/time. HEENT:  [place]/AT.  PERRL.  MMM. Cardiovascular:  RRR, no M/R/G. Lungs:  Resp's even and unlabored.   Decreased breath sounds in LLL.  Scattered expiratory wheeze bilaterally. Abdomen:  BS x 4.  Soft, NT/ND. Musculoskeletal:  No gross deformities.  No edema. Skin:  Warm and dry.   Recent Labs Lab 12/28/12 0046 12/28/12 0047 12/28/12 0615 12/28/12 0703 12/28/12 0800 12/28/12 1630 12/29/12 0447 12/29/12 1724 12/30/12 0610  HGB 9.8*  --   --   --   --   --   --   --   --   WBC 8.9  --   --   --   --   --   --   --   --   PLT 206  --   --   --   --   --   --   --   --   NA 138  --   --  140  --  135 135 137 136  K 4.3  --   --  3.8  --  4.2 4.2 4.1 4.3  CL 95*  --   --  95*  --  91* 91* 94* 94*  CO2 36*  --   --  33*  --  33* 35* 32 31  GLUCOSE 135*  --   --  294*  --  323* 308* 256* 312*  BUN 30*  --   --  34*  --  36* 39* 44* 47*  CREATININE 1.05  --   --  1.10  --  1.12* 1.07 1.06 1.02  CALCIUM 10.1  --   --  10.0  --  10.0 10.2 10.1 10.1  MG  --   --   --  2.1  --  2.1 2.2 2.2 2.2  AST  --   --   --  28  --   --   --   --   --   ALT  --   --   --  17  --   --   --   --   --   ALKPHOS  --   --   --  120*  --   --   --   --   --   BILITOT  --   --   --  0.4  --   --   --   --   --   PROT  --   --   --  7.4  --   --   --   --   --   ALBUMIN  --   --   --  3.4*  --   --   --   --   --   INR  --   --  1.53*  --   --   --  1.31  --  1.62*  TROPONINI  --  <0.30  --   --   --   --   --   --   --   PHART  --   --   --   --  7.390  --   --   --   --   PCO2ART  --   --   --   --  57.2*  --   --   --   --   PO2ART  --   --   --   --  82.0  --   --   --   --   HCO3  --   --   --   --  33.9*  --   --   --   --   O2SAT  --   --   --   --  95.7  --   --   --   --     Recent Labs Lab 12/28/12 2031 12/29/12 0817 12/29/12 1229 12/29/12 1647 12/29/12 2014  GLUCAP 245* 303* 205* 258* 248*   CXR:  None today  ASSESSMENT / PLAN:  Pneumonia (CAP) Chronic hypoxemic / hypercarbic respiratory failure (CO2 appears at baseline) -remote smoker, doubt COPD, ? OHS Severe AS- AVA 0.8  cm2 Acute encephalopathy, likely delirium, resolved now, negative head CT Hyperglycemia - exacerbated by steroids  -->Upper airway pseudowheeze -ct nebs -->  Continue Azithromycin.  Ceftriaxone discontinued by primary team? -->  Follow cultures -->  Supplemental oxygen, goal SpO2>92% -->  Agree with bronchodilators -->  Continue Prednisone 40 --> CXR in am. -->  Hold Neurontin and avoid benzodiazepines -->  Glycemic control per primary team --> PT/OT -->  Will need sleep study as outpt & PFTs to clarify physiology --> COnsider cardiology input for AS  Cyril Mourning MD. FCCP. [facility] Pager [phone] If no response call [phone]

## 2012-12-30 NOTE — Progress Notes (Signed)
Pt transferred to room 4E20 via wheelchair accompanied by RN. Pt alert, oriented and able to follow commands. Settled in bed. VSS. On 2 L O2. Report received from [facility]. Will continue to monitor.

## 2012-12-30 NOTE — Evaluation (Signed)
Occupational Therapy Evaluation Patient Details Name: [name] MRN: [id number] DOB: 08-21-1932 Today's Date: 12/30/2012 Time: 9629-5284 OT Time Calculation (min): 28 min  OT Assessment / Plan / Recommendation History of present illness Pt admit with COPD exacerbation.     Clinical Impression    Pt admitted with above - has been admitted 4x in past 6 mos. .  Pt presents to OT with decreased activity tolerance, confusion, poor safety awareness.  Pt provided contradictory info throughout eval regarding PLOF and assistance at home.  She will need 24 hour assistance at home due to decreased safety awarenes - likely needs SNF, but unsure that she would agree.  Pt will benefit from continued OT to maximize safety and independence with BADLs.   OT Assessment  Patient needs continued OT Services    Follow Up Recommendations  SNF;Supervision/Assistance - 24 hour    Barriers to Discharge Decreased caregiver support    Equipment Recommendations  None recommended by OT    Recommendations for Other Services    Frequency  Min 2X/week    Precautions / Restrictions Precautions Precautions: Fall Restrictions Weight Bearing Restrictions: No   Pertinent Vitals/Pain     ADL  Eating/Feeding: Set up;Supervision/safety Where Assessed - Eating/Feeding: Chair Grooming: Teeth care;Minimal assistance Where Assessed - Grooming: Supported sitting Upper Body Bathing: Moderate assistance Where Assessed - Upper Body Bathing: Supported sitting Lower Body Bathing: Maximal assistance Where Assessed - Lower Body Bathing: Supported sit to stand Upper Body Dressing: Moderate assistance Where Assessed - Upper Body Dressing: Unsupported sitting Lower Body Dressing: +1 Total assistance Where Assessed - Lower Body Dressing: Supported sit to stand Toilet Transfer: Hydrographic surveyor Method: Sit to Barista: Bedside commode;Comfort height toilet Toileting - Clothing  Manipulation and Hygiene: Moderate assistance Where Assessed - Toileting Clothing Manipulation and Hygiene: Standing Equipment Used: Rolling walker;Other (comment) (02) Transfers/Ambulation Related to ADLs: min guard assist - pt impulsive with poor safety awareness ADL Comments: Pt spilled toothpaste down front of gown with no awareness requiring assist to complete/clean up    OT Diagnosis: Generalized weakness;Cognitive deficits  OT Problem List: Decreased strength;Decreased activity tolerance;Impaired balance (sitting and/or standing);Decreased cognition;Decreased safety awareness;Cardiopulmonary status limiting activity;Obesity OT Treatment Interventions: Self-care/ADL training;DME and/or AE instruction;Therapeutic activities;Cognitive remediation/compensation;Patient/family education;Balance training   OT Goals(Current goals can be found in the care plan section) Acute Rehab OT Goals Patient Stated Goal: to sleep OT Goal Formulation: With patient Time For Goal Achievement: 01/13/13 Potential to Achieve Goals: Fair ADL Goals Pt Will Perform Grooming: with supervision;with set-up;sitting Pt Will Transfer to Toilet: with supervision;ambulating;regular height toilet;grab bars Pt Will Perform Toileting - Clothing Manipulation and hygiene: with supervision;sit to/from stand Additional ADL Goal #1: Pt will require no more than min cues for safety  Visit Information  Last OT Received On: 12/30/12 Assistance Needed: +1 Reason Eval/Treat Not Completed: Fatigue/lethargy limiting ability to participate History of Present Illness: Pt admit with COPD exacerbation.         Prior Functioning     Home Living Family/patient expects to be discharged to:: Private residence Living Arrangements: Children Available Help at Discharge: Personal care attendant (930-2 M-Sat) Type of Home: House Home Access: Stairs to enter [employer] of Steps: 2 Entrance Stairs-Rails: Can reach  both Home Layout: One level Home Equipment: Walker - 2 wheels;Shower seat;Bedside commode (2LO2 home O2; lift chair) Additional Comments: Pt providing contradictory info re: home environment and assist available Prior Function Level of Independence: Needs assistance ADL's / Homemaking Assistance Needed:  Pt reports she has assist for bathing, unsure about dressing as she would not answer question.  Sits on commode to perform grooming Comments: Pt reports she has meals on wheels for afternoon meal.  She reports to OT she has caregiver 6a-9a, then 3p-6p 7days/wk and her son is there from 6p on.  Obviously this contradicts info she provided to PT.  Pt changed answers multiple times as OT attempted to glean level of assist/support at home  Communication Communication: No difficulties Dominant Hand: Right         Vision/Perception     Cognition  Cognition Arousal/Alertness: Awake/alert Behavior During Therapy: Impulsive (irritable) Overall Cognitive Status: No family/caregiver present to determine baseline cognitive functioning Area of Impairment: Orientation;Attention;Safety/judgement;Awareness;Problem solving Orientation Level: Disoriented to;Time Current Attention Level: Sustained Memory: Decreased short-term memory Safety/Judgement: Decreased awareness of safety Awareness: Intellectual Problem Solving: Slow processing;Decreased initiation;Difficulty sequencing;Requires verbal cues;Requires tactile cues General Comments: Pt states she couldn't eat her breakfast because they brought her coffee without grapes in it.    Extremity/Trunk Assessment Upper Extremity Assessment Upper Extremity Assessment: LUE deficits/detail LUE Deficits / Details: Pt with only minimal shoulder AROM - reports long term shoulder issues Lower Extremity Assessment Lower Extremity Assessment: Defer to PT evaluation RLE Deficits / Details: grossly 3/5 LLE Deficits / Details: grossly 3/5     Mobility Bed  Mobility Bed Mobility: Rolling Right;Right Sidelying to Sit;Sitting - Scoot to Edge of Bed Rolling Right: 4: Min guard;With rail Right Sidelying to Sit: 4: Min guard;With rails Sitting - Scoot to Edge of Bed: 5: Supervision Details for Bed Mobility Assistance: step by step instruction fro bed mobility min guard assist to guide movement Transfers Transfers: Sit to Stand;Stand to Sit Sit to Stand: 4: Min guard;With upper extremity assist;From bed Stand to Sit: 4: Min guard;With upper extremity assist;To chair/3-in-1 Details for Transfer Assistance: Pt impulsive.  Min guard for safety     Exercise     Balance Balance Balance Assessed: Yes Static Standing Balance Static Standing - Balance Support: Bilateral upper extremity supported Static Standing - Level of Assistance: 5: Stand by assistance Static Standing - Comment/# of Minutes: 2 High Level Balance High Level Balance Activites: Turns;Sudden stops;Direction changes High Level Balance Comments: Can perform with RW with supervision.   End of Session OT - End of Session Equipment Utilized During Treatment: Rolling walker;Oxygen Activity Tolerance: Patient limited by fatigue Patient left: in chair;with call bell/phone within reach Nurse Communication: Mobility status  GO     Jeani Hawking M 12/30/2012, 12:49 PM

## 2012-12-30 NOTE — Progress Notes (Signed)
  Date: 12/30/2012  Patient name: [name]  Medical record number: [id number]  Date of birth: Jan 10, 1933   This patient has been seen and the plan of care was discussed with the house staff. Please see their note for complete details. I concur with their findings with the following additions/corrections: Dr Mayford Knife eval the pt in July 2014 admission when the most recent ECHO was performed and felt her AS was more moderate rather than severe and rec med mgmt. Pt transferred to floor and tele D/C'd.   Burns Spain, MD 12/30/2012, 4:12 PM

## 2012-12-30 NOTE — Progress Notes (Signed)
Report called to [facility] RN on unit [address]. Lucia Bitter states that the floor is being mopped right now and the room will be ready in 20 minutes. Will transfer to 4E20 via wheelchair.

## 2012-12-30 NOTE — Progress Notes (Signed)
Subjective: No acute events overnight. Patient continues to improve and appears to be much more oriented today (AAOX2) She is slightly confused of the year. She is still requiring 2 L [place]. She reports that her SOB has also improved, but complains of back pain which she reports is a chronic problem at home that is best relieved with rest in bed. Her apatite has not changed. She denies N/V fever, night sweats or other symptoms. Her only complaint is that she did not sleep well from being woken up multiple times through out the night.   Objective: Vital signs in last 24 hours: Filed Vitals:   12/29/12 2025 12/29/12 2200 12/30/12 0025 12/30/12 0423  BP:  158/125 132/76 131/49  Pulse:  66 71 63  Temp:   98.3 F (36.8 C) 98.1 F (36.7 C)  TempSrc:   Oral Oral  Resp:  14 23 14   Height:      Weight:      SpO2: 98% 97% 100% 92%   Weight change:   Intake/Output Summary (Last 24 hours) at 12/30/12 0832 Last data filed at 12/30/12 0745  Gross per 24 hour  Intake    169 ml  Output   1325 ml  Net  -1156 ml   General appearance: alert, no distress, slowed mentation and still showing signs of mild delirium/confusion. Mildly improved from 11/4  Head: Normocephalic, without obvious abnormality, atraumatic Eyes: PERRL, conjunctiva clear, EOMI Neck: no adenopathy, no carotid bruit, no JVD, supple, symmetrical, trachea midline and thyroid not enlarged, symmetric, no tenderness/mass/nodules Lungs: wheezes bilateral bases.  Heart: regular rate and rhythm, S1, S2 normal, no murmur, click, rub or gallop Abdomen: soft, non-tender; bowel sounds normal; no masses,  no organomegaly and abdominal wounds from previous surgery covered with bandages c/d/i.  Extremities: warm and well perfused. 1+ pitting edema present BLE.  Lab Results: Results for [name], [name] (MRN [id number]) as of 12/30/2012 08:36  Ref. Range 12/30/2012 06:10  Sodium Latest Range: 135-145 mEq/L 136  Potassium Latest Range: 3.5-5.1 mEq/L  4.3  Chloride Latest Range: 96-112 mEq/L 94 (L)  CO2 Latest Range: 19-32 mEq/L 31  BUN Latest Range: 6-23 mg/dL 47 (H)  Creatinine Latest Range: 0.50-1.10 mg/dL 4.69  Calcium Latest Range: 8.4-10.5 mg/dL 62.9  GFR calc non Af Amer Latest Range: >90 mL/min 51 (L)  GFR calc Af Amer Latest Range: >90 mL/min 59 (L)  Glucose Latest Range: 70-99 mg/dL 528 (H)  Magnesium Latest Range: 1.5-2.5 mg/dL 2.2  Prothrombin Time Latest Range: 11.6-15.2 seconds 18.8 (H)  INR Latest Range: 0.00-1.49  1.62 (H)    Micro Results: Recent Results (from the past 240 hour(s))  CULTURE, BLOOD (ROUTINE X 2)     Status: None   Collection Time    12/28/12  1:25 AM      Result Value Range Status   Specimen Description BLOOD RIGHT HAND   Final   Special Requests BOTTLES DRAWN AEROBIC AND ANAEROBIC 5CC EACH   Final   Culture  Setup Time     Final   Value: 12/28/2012 06:41     Performed at [facility]   Culture     Final   Value:        BLOOD CULTURE RECEIVED NO GROWTH TO DATE CULTURE WILL BE HELD FOR 5 DAYS BEFORE ISSUING A FINAL NEGATIVE REPORT     Performed at [facility]   Report Status PENDING   Incomplete  CULTURE, BLOOD (ROUTINE X 2)  Status: None   Collection Time    12/28/12  1:35 AM      Result Value Range Status   Specimen Description BLOOD LEFT HAND   Final   Special Requests BOTTLES DRAWN AEROBIC AND ANAEROBIC [facility] EACH   Final   Culture  Setup Time     Final   Value: 12/28/2012 06:41     Performed at [facility]   Culture     Final   Value:        BLOOD CULTURE RECEIVED NO GROWTH TO DATE CULTURE WILL BE HELD FOR 5 DAYS BEFORE ISSUING A FINAL NEGATIVE REPORT     Performed at [facility]   Report Status PENDING   Incomplete  MRSA PCR SCREENING     Status: None   Collection Time    12/28/12  8:06 AM      Result Value Range Status   MRSA by PCR NEGATIVE  NEGATIVE Final   Comment:            The GeneXpert MRSA Assay (FDA     approved for NASAL specimens       only), is one component of a     comprehensive MRSA colonization     surveillance program. It is not     intended to diagnose MRSA     infection nor to guide or     monitor treatment for     MRSA infections.   Legionella Results: Negative  Strep Pneumonia Urinary Antigen: Negative   Studies/Results: Dg Chest 2 View  12/28/2012   CLINICAL DATA:  Shortness of breath, weakness, history coronary artery disease, hyperlipidemia, type 2 diabetes, essential benign hypertension, DVT, pulmonary embolism  EXAM: CHEST  2 VIEW  COMPARISON:  12/28/2012  FINDINGS: Enlargement of cardiac silhouette with pulmonary vascular congestion.  Chronic elevation of right diaphragm.  Calcified mildly tortuous thoracic aorta.  Peribronchial thickening with question perihilar infiltrates.  Improved aeration left lower lobe.  No gross pleural effusion or pneumothorax.  Bones demineralized.  IMPRESSION: Enlargement of cardiac silhouette with pulmonary vascular congestion.  Bronchitic changes with questionable mild perihilar infiltrates   Electronically Signed   By: Ulyses Southward M.D.   On: 12/28/2012 15:39   Ct Head Wo Contrast  12/29/2012   CLINICAL DATA:  History of altered and decreased mental status. History of acute encephalopathy.  EXAM: CT HEAD WITHOUT CONTRAST  TECHNIQUE: Contiguous axial images were obtained from the base of the skull through the vertex without intravenous contrast.  COMPARISON:  CT 10/02/2010.  FINDINGS: There is no evidence of brain mass, brain hemorrhage, or acute infarction. There is hypodensity consistent with previous remote infarction and encephalomalacia in the left frontal region. Moderate atrophic changes are seen. Stable periventricular and white matter hypodensity microvascular ischemic changes are seen. Non aneurysmal arterial calcifications are present.  The ventricular system is normal size and shape. There is no evidence of shift of midline structures, parenchymal lesion, or subdural or  epidural hematoma.  The calvarium is intact. Mastoids are well aerated. There is minimal bilateral maxillary mucosal thickening right greater than left.  IMPRESSION: There is no evidence of brain mass, brain hemorrhage, or acute infarction. There is hypodensity consistent with previous remote infarction and encephalomalacia in the left frontal region. Moderate atrophic changes are seen. Stable periventricular and white matter hypodensity microvascular ischemic changes are seen. Non aneurysmal arterial calcifications are present.  No acute or active brain process is seen. No skull lesion is evident. There is  minimal bilateral maxillary mucosal thickening right greater than left.   Electronically Signed   By: Onalee Hua  Call M.D.   On: 12/29/2012 11:02   Medications: I have reviewed the patient's current medications. Scheduled Meds: . albuterol  2.5 mg Nebulization TID   And  . ipratropium  0.5 mg Nebulization TID  . allopurinol  100 mg Oral BID  . aspirin EC  81 mg Oral Daily  . atorvastatin  40 mg Oral Daily  . azithromycin  500 mg Oral Q24H  . FLUoxetine  20 mg Oral BID  . furosemide  80 mg Oral BID  . insulin aspart  0-15 Units Subcutaneous TID WC  . levothyroxine  150 mcg Oral QAC breakfast  . mometasone-formoterol  2 puff Inhalation BID  . montelukast  10 mg Oral Daily  . pantoprazole  40 mg Oral Daily  . predniSONE  40 mg Oral Q breakfast  . sodium chloride  3 mL Intravenous Q12H  . Warfarin - Pharmacist Dosing Inpatient   Does not apply q1800   Continuous Infusions:  PRN Meds:.sodium chloride, acetaminophen, albuterol, Ipratropium-Albuterol, sodium chloride Assessment/Plan:  [name] is a 79 yo F with past medical history significant for DM, COPD on 4L home O2, HF, AS, Gout, hypothyroidism, A Fib and hyperlipidemia HD 3 who presents with COPD exacerbation with SOB and productive cough.   Principal Problem:   COPD exacerbation Active Problems:   HYPOTHYROIDISM   DIABETES  MELLITUS   HYPERLIPIDEMIA   GOUT   OBESITY, MORBID   HYPERTENSION   Atrial fibrillation   COPD   CAD (coronary artery disease)   Aortic stenosis   CAP (community acquired pneumonia)   Hypertensive urgency   Encephalopathy acute   Chronic respiratory failure  #COPD Exacerbation: Patient continues to improve. She has been weaned to 1.5L [place] and her sats have remained >92%.  - Continue Combivent as needed.  - Discontinue Ceftriaxone - Convert Azithromycin to PO for 7 days total for pneumonia coverage.  - Continue to follow sputum cultures - Blood, Urine and Legionella cultures negative.  - Maintain O2 Sat>92% - Convert Methylprednisolone 125mg  BID IV plan to convert to Prednisone 40mg  PO QD.  - PT/OT consult pending. Plan to remove foley on 12/31/12 pending PT evaluation of ambulation.    #Acute Encephalopathy: Patient confusion continues to improve however she continues to have mild confusion (date). Confusion likely due to hypercarbia on admission. CT scan completed on 12/29/12 with unremarkable findings.    #Hypertensive Urgency: Patient's vital signs have remained stable since admission. Hypertension on admission likely related to fluid retention after missing lasix. Patient treated in ED with Lasix 100mg . K and MG stable this AM (4.3 and 2.2 respectively).  - Continue home Lasix 80 mg PO BID - Contine to monitor BMP q 12. (Monitor K and Mg)  #Gout: Acute flare - Continue home dose of allopurinol   #Atrial Fibrillation: Patient has continued be rate controlled with bradycardia and RBBB. Patients INR was 1.62 this AM. Defer management to pharmacy.  - Continue Coumadin per pharmacy recommendations.   #DM: Blood glucose continues to be elevated (312 this AM). Expected in the setting of infection and the administration of steroids. Will reassess in AM following transition of steroids to PO.  - Resume home medication of glipizide 10mg  PO BID.  - Continue Insulin sliding scale TID    #Hypothyroidism: TSH level normal 06/26/12 (4.14) - Continue home synthroid  #CKD: Creatinine stable at 1.02 - Continue to monitor.  Prophylaxis:  - Continue home Coumadin. Plan to change dosage per pharmacy recommendation.    Dispostion: Stepdown status with plan to transfer to med surg floor this AM for continuous pulse oximetry monitoring.     This is a Psychologist, occupational Note.  The care of the patient was discussed with Dr. Glendell Docker the assessment and plan formulated with their assistance.  Please see their attached note for official documentation of the daily encounter.   LOS: 2 days  Lalla Brothers Medical Student MS3

## 2012-12-30 NOTE — Progress Notes (Signed)
OT Cancellation Note  Patient Details Name: [name] MRN: [id number] DOB: 10/23/1932   Cancelled Treatment:    Reason Eval/Treat Not Completed: Fatigue/lethargy limiting ability to participate - pt refusing OT this am due to fatigue.  Will reattempt later today  Boykin Reaper [phone] 12/30/2012, 10:48 AM

## 2012-12-30 NOTE — Progress Notes (Signed)
Subjective: [name] is a 79 y.o. female presenting with COPD exacerbation.  NAE overnight. Feeling much better today. SOB resolving. O2 weaned, but desats while talking to 87%, restarted at 1 L. A+o x 3  Objective: Vital signs in last 24 hours: Filed Vitals:   12/29/12 2025 12/29/12 2200 12/30/12 0025 12/30/12 0423  BP:  158/125 132/76 131/49  Pulse:  66 71 63  Temp:   98.3 F (36.8 C) 98.1 F (36.7 C)  TempSrc:   Oral Oral  Resp:  14 23 14   Height:      Weight:      SpO2: 98% 97% 100% 92%   Weight change:   Intake/Output Summary (Last 24 hours) at 12/30/12 0751 Last data filed at 12/30/12 0745  Gross per 24 hour  Intake    649 ml  Output   1525 ml  Net   -876 ml   Physical Exam  Nursing note and vitals reviewed.  Constitutional: She is oriented to person, place, and time and well-developed, well-nourished, and in no distress. No distress.  HENT:  Head: Normocephalic and atraumatic.  Eyes: Conjunctivae and EOM are normal. Pupils are equal, round, and reactive to light.  Cardiovascular: Normal rate and regular rhythm.  Murmur (3/6 SEM) heard.  Pulmonary/Chest: Effort normal. No respiratory distress. She has mild wheezing bil. She has no wheezes. She has no rales.  Abdominal: Soft. Bowel sounds are normal. She exhibits no distension. There is no tenderness. There is no rebound. A series of small non draining non purulent no erythematous wounds on the abdomen. Morbidly obese  Musculoskeletal: She exhibits no edema.  Feet:  Neurological: A+O x 3. Mildly confused. Skin: Skin is warm and dry. She is not diaphoretic.      Lab Results: Basic Metabolic Panel:  Recent Labs Lab 12/29/12 1724 12/30/12 0610  NA 137 136  K 4.1 4.3  CL 94* 94*  CO2 32 31  GLUCOSE 256* 312*  BUN 44* 47*  CREATININE 1.06 1.02  CALCIUM 10.1 10.1  MG 2.2 2.2   Liver Function Tests:  Recent Labs Lab 12/28/12 0703  AST 28  ALT 17  ALKPHOS 120*  BILITOT 0.4  PROT 7.4   ALBUMIN 3.4*    Recent Labs Lab 12/28/12 1150  AMMONIA <10*   CBC:  Recent Labs Lab 12/28/12 0046  WBC 8.9  HGB 9.8*  HCT 33.7*  MCV 80.8  PLT 206   Cardiac Enzymes:  Recent Labs Lab 12/28/12 0047  TROPONINI <0.30   BNP:  Recent Labs Lab 12/28/12 0047  PROBNP 7922.0*   ABG: 7.39, 82, 57, 33  Studies/Results: Dg Chest 2 View  12/28/2012   CLINICAL DATA:  Shortness of breath, weakness, history coronary artery disease, hyperlipidemia, type 2 diabetes, essential benign hypertension, DVT, pulmonary embolism  EXAM: CHEST  2 VIEW  COMPARISON:  12/28/2012  FINDINGS: Enlargement of cardiac silhouette with pulmonary vascular congestion.  Chronic elevation of right diaphragm.  Calcified mildly tortuous thoracic aorta.  Peribronchial thickening with question perihilar infiltrates.  Improved aeration left lower lobe.  No gross pleural effusion or pneumothorax.  Bones demineralized.  IMPRESSION: Enlargement of cardiac silhouette with pulmonary vascular congestion.  Bronchitic changes with questionable mild perihilar infiltrates   Electronically Signed   By: Ulyses Southward M.D.   On: 12/28/2012 15:39   Ct Head Wo Contrast  12/29/2012   CLINICAL DATA:  History of altered and decreased mental status. History of acute encephalopathy.  EXAM: CT HEAD WITHOUT CONTRAST  TECHNIQUE: Contiguous axial images were obtained from the base of the skull through the vertex without intravenous contrast.  COMPARISON:  CT 10/02/2010.  FINDINGS: There is no evidence of brain mass, brain hemorrhage, or acute infarction. There is hypodensity consistent with previous remote infarction and encephalomalacia in the left frontal region. Moderate atrophic changes are seen. Stable periventricular and white matter hypodensity microvascular ischemic changes are seen. Non aneurysmal arterial calcifications are present.  The ventricular system is normal size and shape. There is no evidence of shift of midline structures,  parenchymal lesion, or subdural or epidural hematoma.  The calvarium is intact. Mastoids are well aerated. There is minimal bilateral maxillary mucosal thickening right greater than left.  IMPRESSION: There is no evidence of brain mass, brain hemorrhage, or acute infarction. There is hypodensity consistent with previous remote infarction and encephalomalacia in the left frontal region. Moderate atrophic changes are seen. Stable periventricular and white matter hypodensity microvascular ischemic changes are seen. Non aneurysmal arterial calcifications are present.  No acute or active brain process is seen. No skull lesion is evident. There is minimal bilateral maxillary mucosal thickening right greater than left.   Electronically Signed   By: Onalee Hua  Call M.D.   On: 12/29/2012 11:02   Medications: I have reviewed the patient's current medications. Scheduled Meds: . albuterol  2.5 mg Nebulization TID   And  . ipratropium  0.5 mg Nebulization TID  . allopurinol  100 mg Oral BID  . aspirin EC  81 mg Oral Daily  . atorvastatin  40 mg Oral Daily  . azithromycin  500 mg Oral Q24H  . FLUoxetine  20 mg Oral BID  . furosemide  80 mg Oral BID  . insulin aspart  0-15 Units Subcutaneous TID WC  . levothyroxine  150 mcg Oral QAC breakfast  . mometasone-formoterol  2 puff Inhalation BID  . montelukast  10 mg Oral Daily  . pantoprazole  40 mg Oral Daily  . predniSONE  40 mg Oral Q breakfast  . sodium chloride  3 mL Intravenous Q12H  . Warfarin - Pharmacist Dosing Inpatient   Does not apply q1800   Continuous Infusions:  PRN Meds:.sodium chloride, acetaminophen, albuterol, Ipratropium-Albuterol, sodium chloride Assessment/Plan: Principal Problem:   CAP (community acquired pneumonia) Active Problems:   HYPOTHYROIDISM   DIABETES MELLITUS   HYPERLIPIDEMIA   GOUT   OBESITY, MORBID   HYPERTENSION   Atrial fibrillation   COPD   CAD (coronary artery disease)   Aortic stenosis   Hypertensive urgency    Encephalopathy acute   Chronic respiratory failure   COPD exacerbation  Assessment & Plan by Problem:  Ms. Longhi is a 79yo F with history of HF c/b AS and AR, pulm HTN, gout, afib on a/c, DM and chronic respiratory failure on 4L home O2 admitted on 12/28/12 with complaints of SOB with sputum producing cough.   #COPD exacerbation c/b CAP: Based on symptoms and evidenced by CXR. Saturating well on home O2. At admission, CURB 65 score is 2, suggesting 9% risk of death.  - Transfer to med surg w/ pulse ox - D/C Ceftriaxone/ Cont Azithro for a total of 7 d  - Follow sputum cx (negative), blood cx (negative), legionella(negative) & strep AG (neg) - Titrate O2 to >92% - D/C Methylprednisolone 125 mg BID, start prednisone 40 mg qd PO  #Acute Encephalopathy-Resolved  2/2 to COPD exacerbation and hypercarbia (57, baseline 39).  CT head negative. Ammonia normal  #Hypertensive Urgency-resolved: In the setting of history of  HFPEF; BP 240/200 at admission. Likely due to fluid overload from missing meds today & acute stress; treated with lasix gtt 100mg  in ED.  - Continue Home lasix 80mg  PO BID - BMET q24  #Gout: Appears to have acute flare in setting of acute infection  -Cont allopurinol, as patient already on for ppx  -Treat with Colchicine (CrCl 33, no adjustment in dose --> 1.2mg  now, then 0.6mg  one hour later)   #Afib: rate controlled/bradycardic (chronic with h/o SSS, junctional rhythm on EKG at admission)  -Follow INR  -Cont coumadin per pharmacy   #DM: Last HbA1c 6.7 on 11/10/12. Hold home glipizide during hospitalization  -SSI mod   #Hypothyroidism: Last TSH 4.14 (wnl) on 06/26/12  -Cont home dose synthroid   #Stage 3 CKD: At baseline, continue to monitor   #Normocytic Anemia: Stable (baseline ~10), monitor as needed   #VTE ppx: on coumadin   #Code Status: Full Code   Dispo: Disposition is deferred at this time, awaiting improvement of current medical problems.  Anticipated  discharge in approximately 2-3 day(s).   The patient does have a current PCP (Aletta Edouard, MD) and does need an [facility] follow-up appointment after discharge.  The patient does have transportation limitations that hinder transportation to clinic appointments.  .Services Needed at time of discharge: Y = Yes, Blank = No PT:   OT:   RN:   Equipment:   Other:     LOS: 2 days   Pleas Koch, MD 12/30/2012, 7:51 AM

## 2012-12-31 DIAGNOSIS — I359 Nonrheumatic aortic valve disorder, unspecified: Secondary | ICD-10-CM

## 2012-12-31 DIAGNOSIS — I2510 Atherosclerotic heart disease of native coronary artery without angina pectoris: Secondary | ICD-10-CM

## 2012-12-31 LAB — BASIC METABOLIC PANEL
BUN: 43 mg/dL — ABNORMAL HIGH (ref 6–23)
CO2: 30 mEq/L (ref 19–32)
Calcium: 9.4 mg/dL (ref 8.4–10.5)
Chloride: 96 mEq/L (ref 96–112)
Creatinine, Ser: 0.95 mg/dL (ref 0.50–1.10)
Creatinine, Ser: 1.14 mg/dL — ABNORMAL HIGH (ref 0.50–1.10)
GFR calc Af Amer: 65 mL/min — ABNORMAL LOW (ref >90)
GFR calc Af Amer: 52 mL/min — ABNORMAL LOW (ref >90)
GFR calc non Af Amer: 56 mL/min — ABNORMAL LOW (ref >90)
GFR calc non Af Amer: 45 mL/min — ABNORMAL LOW (ref >90)
Glucose, Bld: 157 mg/dL — ABNORMAL HIGH (ref 70–99)
Glucose, Bld: 334 mg/dL — ABNORMAL HIGH (ref 70–99)
Sodium: 139 mEq/L (ref 135–145)

## 2012-12-31 LAB — PROTIME-INR
INR: 2.04 — ABNORMAL HIGH (ref 0.00–1.49)
Prothrombin Time: 22.4 seconds — ABNORMAL HIGH (ref 11.6–15.2)

## 2012-12-31 LAB — GLUCOSE, CAPILLARY
Glucose-Capillary: 260 mg/dL — ABNORMAL HIGH (ref 70–99)
Glucose-Capillary: 333 mg/dL — ABNORMAL HIGH (ref 70–99)
Glucose-Capillary: 261 mg/dL — ABNORMAL HIGH (ref 70–99)

## 2012-12-31 MED ORDER — WARFARIN SODIUM 5 MG PO TABS
5.0000 mg | ORAL_TABLET | Freq: Once | ORAL | Status: AC
  Administered 2012-12-31: 5 mg via ORAL
  Filled 2012-12-31: qty 1

## 2012-12-31 MED ORDER — PREDNISONE 20 MG PO TABS: 40.0000 mg | ORAL_TABLET | Freq: Every day | ORAL | Status: DC

## 2012-12-31 MED ORDER — OMEPRAZOLE 20 MG PO CPDR: DELAYED_RELEASE_CAPSULE | ORAL | Status: DC

## 2012-12-31 MED ORDER — AZITHROMYCIN 500 MG PO TABS: 500.0000 mg | ORAL_TABLET | ORAL | Status: DC

## 2012-12-31 NOTE — Progress Notes (Signed)
  Date: 12/31/2012  Patient name: [name]  Medical record number: [id number]  Date of birth: 02-16-1933   This patient has been seen and the plan of care was discussed with the house staff. Please see their note for complete details. I concur with their findings with the following additions/corrections: Ms Tennison states that her breathing is not yet back to baseline. On exam, she is moving good air with min wheezing. Will d/C foley, cont PT, and plan for D/C in AM.  Burns Spain, MD 12/31/2012, 4:15 PM

## 2012-12-31 NOTE — Progress Notes (Signed)
Subjective: No acute events overnight. Patient continues to improve and appears to be much more oriented today (AAOX3). She is still requiring 2L [place] but has maintained her stats >92%. She reports that her SOB has also improved, but reports that it is still not back to her baseline. Her apatite has not changed. She denies N/V fever, night sweats or other symptoms. She continues to complain of poor rest through out the night due being woken up by the nursing staff.   Objective: Vital signs in last 24 hours: Filed Vitals:   12/30/12 2115 12/31/12 0555 12/31/12 0853 12/31/12 0921  BP: 150/87 140/80  148/78  Pulse: 73 66 69 65  Temp:  97.6 F (36.4 C)  98.6 F (37 C)  TempSrc: Oral Oral  Oral  Resp: 19 18 18    Height:      Weight:  121.246 kg (267 lb 4.8 oz)    SpO2: 95% 96% 92% 93%   Weight change:   Intake/Output Summary (Last 24 hours) at 12/31/12 1314 Last data filed at 12/31/12 1116  Gross per 24 hour  Intake    720 ml  Output   3125 ml  Net  -2405 ml   General appearance: alert, no distress, slowed mentation and improved mental status (AAOX3) Head: Normocephalic, without obvious abnormality, atraumatic Eyes: PERRL, conjunctiva clear, EOMI Neck: no adenopathy, no carotid bruit, no JVD, supple, symmetrical, trachea midline and thyroid not enlarged, symmetric, no tenderness/mass/nodules Lungs: wheezes bilateral bases and course breath sounds bilaterally.  Heart: regular rate and rhythm, S1, S2 normal, no murmur, click, rub or gallop Abdomen: soft, non-tender; bowel sounds normal; no masses,  no organomegaly and abdominal wounds from previous surgery covered with bandages c/d/i.  Extremities: warm and well perfused. 2+ pitting edema present BLE.  Lab Results: Results for [name], [name] (MRN [id number]) as of 12/31/2012 13:18  Ref. Range 12/31/2012 05:30 12/31/2012 06:49  Glucose-Capillary Latest Range: 70-99 mg/dL  295 (H)  Sodium Latest Range: 135-145 mEq/L 140   Potassium  Latest Range: 3.5-5.1 mEq/L 3.4 (L)   Chloride Latest Range: 96-112 mEq/L 96   CO2 Latest Range: 19-32 mEq/L 35 (H)   BUN Latest Range: 6-23 mg/dL 44 (H)   Creatinine Latest Range: 0.50-1.10 mg/dL 2.84   Calcium Latest Range: 8.4-10.5 mg/dL 9.7   GFR calc non Af Amer Latest Range: >90 mL/min 56 (L)   GFR calc Af Amer Latest Range: >90 mL/min 65 (L)   Glucose Latest Range: 70-99 mg/dL 132 (H)   Prothrombin Time Latest Range: 11.6-15.2 seconds 22.4 (H)   INR Latest Range: 0.00-1.49  2.04 (H)      Micro Results: Recent Results (from the past 240 hour(s))  CULTURE, BLOOD (ROUTINE X 2)     Status: None   Collection Time    12/28/12  1:25 AM      Result Value Range Status   Specimen Description BLOOD RIGHT HAND   Final   Special Requests BOTTLES DRAWN AEROBIC AND ANAEROBIC 5CC EACH   Final   Culture  Setup Time     Final   Value: 12/28/2012 06:41     Performed at [facility]   Culture     Final   Value:        BLOOD CULTURE RECEIVED NO GROWTH TO DATE CULTURE WILL BE HELD FOR 5 DAYS BEFORE ISSUING A FINAL NEGATIVE REPORT     Performed at [facility]   Report Status PENDING   Incomplete  CULTURE, BLOOD (  ROUTINE X 2)     Status: None   Collection Time    12/28/12  1:35 AM      Result Value Range Status   Specimen Description BLOOD LEFT HAND   Final   Special Requests BOTTLES DRAWN AEROBIC AND ANAEROBIC [facility] EACH   Final   Culture  Setup Time     Final   Value: 12/28/2012 06:41     Performed at [facility]   Culture     Final   Value:        BLOOD CULTURE RECEIVED NO GROWTH TO DATE CULTURE WILL BE HELD FOR 5 DAYS BEFORE ISSUING A FINAL NEGATIVE REPORT     Performed at [facility]   Report Status PENDING   Incomplete  MRSA PCR SCREENING     Status: None   Collection Time    12/28/12  8:06 AM      Result Value Range Status   MRSA by PCR NEGATIVE  NEGATIVE Final   Comment:            The GeneXpert MRSA Assay (FDA     approved for NASAL specimens       only), is one component of a     comprehensive MRSA colonization     surveillance program. It is not     intended to diagnose MRSA     infection nor to guide or     monitor treatment for     MRSA infections.   Legionella Results: Negative  Strep Pneumonia Urinary Antigen: Negative   Studies/Results: No results found. Medications: I have reviewed the patient's current medications. Scheduled Meds:  albuterol  2.5 mg Nebulization TID   And   ipratropium  0.5 mg Nebulization TID   allopurinol  100 mg Oral BID   aspirin EC  81 mg Oral Daily   atorvastatin  40 mg Oral Daily   azithromycin  500 mg Oral Q24H   FLUoxetine  20 mg Oral BID   furosemide  80 mg Oral BID   glipiZIDE  10 mg Oral BID AC   insulin aspart  0-15 Units Subcutaneous TID WC   levothyroxine  150 mcg Oral QAC breakfast   mometasone-formoterol  2 puff Inhalation BID   montelukast  10 mg Oral Daily   pantoprazole  40 mg Oral Daily   polyethylene glycol  17 g Oral Daily   predniSONE  40 mg Oral Q breakfast   sodium chloride  3 mL Intravenous Q12H   warfarin  5 mg Oral ONCE-1800   Warfarin - Pharmacist Dosing Inpatient   Does not apply q1800   Continuous Infusions:  PRN Meds:.sodium chloride, acetaminophen, albuterol, Ipratropium-Albuterol, sodium chloride   Assessment/Plan:  [name] is a 79 yo F with past medical history significant for DM, COPD on 4L home O2, HF, AS, Gout, hypothyroidism, A Fib and hyperlipidemia HD 4 who presents with COPD exacerbation with SOB and productive cough.   Principal Problem:   COPD exacerbation Active Problems:   HYPOTHYROIDISM   DIABETES MELLITUS   HYPERLIPIDEMIA   GOUT   OBESITY, MORBID   HYPERTENSION   Atrial fibrillation   COPD   CAD (coronary artery disease)   Aortic stenosis   CAP (community acquired pneumonia)   Hypertensive urgency   Encephalopathy acute   Chronic respiratory failure  #COPD Exacerbation: Patient continues to improve.  She has been weaned to 1.5L [place] and her sats have remained >92%.  - Continue Combivent as needed.  -  Continue Azithromycin PO for 7 days total for pneumonia coverage.  - Continue to follow sputum cultures - Blood, Urine and Legionella cultures negative.  - Maintain O2 Sat>92% - Continue Prednisone 40mg  PO QD.  - Plan to discharge with steroid taper of 3 days 40 mg followed by 3 days 20 mg - Remove foley - PT and OT therapy recommend SNF at discharge and PT-3X/wk and OT-2X/wk. Appreciate Recommendations.    #Acute Encephalopathy: Patient's mental status continues to improve this AM. Confusion likely due to hypercarbia on admission. CT scan completed on 12/29/12 with unremarkable findings. Continue to monitor.    #Hypertensive Urgency: Patient's vital signs have remained stable since admission. Hypertension on admission likely related to fluid retention after missing lasix. Patient treated in ED with Lasix 100mg . K minimal reduced this AM (3.4). Continue to monitor and replete prn.  - Continue home Lasix 80 mg PO BID - Contine to monitor BMP q 12. (Monitor K and Mg)  #Gout: Acute flare - Continue home dose of allopurinol   #Atrial Fibrillation: Patient has continued be rate controlled with bradycardia and RBBB. Patients INR was 2.04 this AM. Defer management to pharmacy.  - Continue Coumadin per pharmacy recommendations.   #DM: Blood glucose improved this AM (157mg /dl).  - Continue home medication of glipizide 10mg  PO BID.  - Continue Insulin sliding scale TID   #Hypothyroidism: TSH level normal 06/26/12 (4.14) - Continue home synthroid  #CKD: Creatinine stable at 1.02 - Continue to monitor.   #Aortic Stenosis: Dr Mayford Knife evaluated the pt in July 2014 admission when the most recent ECHO was performed and felt her AS was more moderate rather than severe and rec med mgmt. Plan to manage as outpatient post discharge.   Prophylaxis:  - Continue home Coumadin.     Dispostion: Floor status  likely discharge on 01/01/13. Plan to have follow up appointment early next week. (01/04/13 ideally).    This is a Psychologist, occupational Note.  The care of the patient was discussed with Dr. Glendell Docker the assessment and plan formulated with their assistance.  Please see their attached note for official documentation of the daily encounter.   LOS: 3 days  Lalla Brothers Medical Student MS3

## 2012-12-31 NOTE — Progress Notes (Signed)
Notified by staff that patient stated she was having chest pain. Immediately went in room and assessed patient. Patient states chest pain a 5 on scale of 1-10. Patient states it felt like a "nubbing" pain. Asked patient does it feel like pressure or a sharp pain and patient answered yes to both. Got Stat EKG per protocol.  Aspirin was already given with morning medications. Vital signs stable - b/p elevated 165/99 oxygen sats -95%. Notified charge nurse. Notified Dr. Andrey Campanile. By the time the doctor was notified, patient states chest pain a level 3 out of 10 and answers yes when asked has the pain improved. MD arrived to assess patient. EKG results given. No nitroglycerin tab ordered but to continue monitoring patient for chest pain. Will continue to monitor patient to end of shift. Doctor also stated patient is no longer on isolation and does not VRE and ordered to be discontinued. Will carry through as ordered.

## 2012-12-31 NOTE — Progress Notes (Signed)
Occupational Therapy Treatment Patient Details Name: [name] MRN: [id number] DOB: [birth year] Today's Date: 12/31/2012 Time: 9562-1308 OT Time Calculation (min): 19 min  OT Assessment / Plan / Recommendation  History of present illness Pt admit with COPD exacerbation.     OT comments  Pt reluctantly agreeable to grooming at bed level after initial refusal of OOB or sitting EOB activities. Pt demonstrates decreased awareness of deficits and would benefit from SNF Rehab after acute stay if she agrees. Cont acute OT to assist w/ increased independence w/ ADL's.  Follow Up Recommendations  SNF;Supervision/Assistance - 24 hour    Barriers to Discharge       Equipment Recommendations  None recommended by OT    Recommendations for Other Services    Frequency Min 2X/week   Progress towards OT Goals Progress towards OT goals: Progressing toward goals  Plan Discharge plan remains appropriate    Precautions / Restrictions Precautions Precautions: Fall Restrictions Weight Bearing Restrictions: No   Pertinent Vitals/Pain No c/o pain    ADL  Grooming: Performed;Wash/dry hands;Wash/dry face;Teeth care;Set up (Pt declined sitting EOB &/or sitting in chair) Where Assessed - Grooming: Supine, head of bed up Upper Body Bathing: Simulated;Moderate assistance Where Assessed - Upper Body Bathing: Supported sitting ADL Comments: Pt w/ decreased awareness of deficits, initially declining all therapy, but then reluctantly agreeable to grooming. After set up, pt stated "I thought you were going to do this for me!" Re: washing face, hands. Pt was educated verbally in taking rest breaks and role of OT, Goals reviewed w/ pt. Explained recommendation for SNF, pt currently adamently refusing, however acute OT does recommend this for pt safety.    OT Diagnosis:    OT Problem List:   OT Treatment Interventions:     OT Goals(current goals can now be found in the care plan section) Acute Rehab OT  Goals Patient Stated Goal: To rest OT Goal Formulation: With patient Time For Goal Achievement: 01/13/13 Potential to Achieve Goals: Fair  Visit Information  Last OT Received On: 12/31/12 Assistance Needed: +1 History of Present Illness: Pt admit with COPD exacerbation.      Subjective Data      Prior Functioning       Cognition  Cognition Arousal/Alertness: Awake/alert Behavior During Therapy: Impulsive Overall Cognitive Status: No family/caregiver present to determine baseline cognitive functioning Area of Impairment: Orientation;Attention;Safety/judgement;Awareness;Problem solving Current Attention Level: Sustained Memory: Decreased short-term memory Safety/Judgement: Decreased awareness of safety;Decreased awareness of deficits Awareness: Intellectual Problem Solving: Slow processing;Decreased initiation;Difficulty sequencing;Requires verbal cues;Requires tactile cues    Mobility  Bed Mobility Bed Mobility: Not assessed (Pt declined sitting EOB) Transfers Transfers: Not assessed (Pt declined)            End of Session OT - End of Session Equipment Utilized During Treatment: Oxygen Activity Tolerance: Patient limited by fatigue Patient left: in bed;with call bell/phone within reach Nurse Communication: Mobility status;Other (comment) (ADL's)  GO     Alm Bustard 12/31/2012, 9:56 AM

## 2012-12-31 NOTE — Progress Notes (Signed)
Physical Therapy Treatment Patient Details Name: [name] MRN: [id number] DOB: 04/14/1932 Today's Date: 12/31/2012 Time: 7829-5621 PT Time Calculation (min): 24 min  PT Assessment / Plan / Recommendation  History of Present Illness Pt admit with COPD exacerbation.     PT Comments   Pt with nursing performing bed mobility for hygiene when PT arrived. Pt requires min guard and railings for all bed mobility.  Pt refuses gait training but agreeable to sit up in chair.  Pt able to transfer to chair and perform multiple sit to stand reps with min guard assist.  Pt limited by decreased activity tolerance and will continue to benefit from skilled PT and follow up in a SNF for increased strengthening and mobility.  Follow Up Recommendations  Supervision/Assistance - 24 hour;SNF     Does the patient have the potential to tolerate intense rehabilitation     Barriers to Discharge        Equipment Recommendations       Recommendations for Other Services    Frequency Min 3X/week   Progress towards PT Goals Progress towards PT goals: Progressing toward goals  Plan Current plan remains appropriate    Precautions / Restrictions Precautions Precautions: Fall Restrictions Weight Bearing Restrictions: No   Pertinent Vitals/Pain No c/o pain.  Pt on 2lO2 throughout treatment    Mobility  Bed Mobility Bed Mobility: Rolling Right;Rolling Left Rolling Right: 4: Min guard;With rail Rolling Left: 4: Min guard;With rail Right Sidelying to Sit: 4: Min guard;With rails Sitting - Scoot to Edge of Bed: 5: Supervision Details for Bed Mobility Assistance: cues for sequencing, rest breaks due to decreased activity tolerance Transfers Sit to Stand: 4: Min guard;With upper extremity assist;From bed (multiple reps for strengthening) Stand to Sit: 4: Min guard;With upper extremity assist;To chair/3-in-1 Stand Pivot Transfers: 4: Min guard Details for Transfer Assistance: min guard for safety with  lines due to pt impulsive    Exercises     PT Diagnosis:    PT Problem List:   PT Treatment Interventions:     PT Goals (current goals can now be found in the care plan section) Acute Rehab PT Goals Patient Stated Goal: To rest  Visit Information  Last PT Received On: 12/31/12 Assistance Needed: +1 History of Present Illness: Pt admit with COPD exacerbation.      Subjective Data  Patient Stated Goal: To rest   Cognition  Cognition Arousal/Alertness: Awake/alert Behavior During Therapy: Impulsive Overall Cognitive Status: No family/caregiver present to determine baseline cognitive functioning Area of Impairment: Orientation;Attention;Safety/judgement;Awareness;Problem solving Current Attention Level: Sustained Memory: Decreased short-term memory Safety/Judgement: Decreased awareness of safety;Decreased awareness of deficits Awareness: Intellectual Problem Solving: Slow processing;Decreased initiation;Difficulty sequencing;Requires verbal cues;Requires tactile cues    Balance  Static Standing Balance Static Standing - Level of Assistance: 5: Stand by assistance  End of Session PT - End of Session Equipment Utilized During Treatment: Oxygen Activity Tolerance: Patient limited by fatigue Patient left: in chair;with call bell/phone within reach;with nursing/sitter in room Nurse Communication: Mobility status   GP     [name],[name] 12/31/2012, 1:33 PM

## 2012-12-31 NOTE — Progress Notes (Signed)
Subjective: [name] was seen and examined by me this morning.  She had improved but feels her dyspnea is not back to baseline.  In early afternoon, she reported a few minutes of 5/10 CP at rest that quickly improved without intervention.  Stat EKG without signs of acute ischemia.    Objective: Vital signs in last 24 hours: Filed Vitals:   12/31/12 0853 12/31/12 0921 12/31/12 1342 12/31/12 1501  BP:  148/78  152/54  Pulse: 69 65 73 75  Temp:  98.6 F (37 C)  98.6 F (37 C)  TempSrc:  Oral  Oral  Resp: 18  18 18   Height:      Weight:      SpO2: 92% 93% 93% 98%   Weight change:   Intake/Output Summary (Last 24 hours) at 12/31/12 1519 Last data filed at 12/31/12 1500  Gross per 24 hour  Intake    960 ml  Output   3650 ml  Net  -2690 ml   General: resting in bed in NAD Cardiac: RRR, no rubs, murmurs or gallops Pulm: coarse BS, moving normal volumes of air Abd: soft, nontender, nondistended, BS present Ext: warm and well perfused, 1+ B/L LE edema Neuro: alert and oriented X3, mentating appropriately  Lab Results: Basic Metabolic Panel:  Recent Labs Lab 12/29/12 1724 12/30/12 0610 12/30/12 1755 12/31/12 0530  NA 137 136 135 140  K 4.1 4.3 4.0 3.4*  CL 94* 94* 92* 96  CO2 32 31 32 35*  GLUCOSE 256* 312* 250* 157*  BUN 44* 47* 47* 44*  CREATININE 1.06 1.02 1.07 0.95  CALCIUM 10.1 10.1 9.8 9.7  MG 2.2 2.2  --   --    Liver Function Tests:  Recent Labs Lab 12/28/12 0703  AST 28  ALT 17  ALKPHOS 120*  BILITOT 0.4  PROT 7.4  ALBUMIN 3.4*    Recent Labs Lab 12/28/12 1150  AMMONIA <10*   CBC:  Recent Labs Lab 12/28/12 0046  WBC 8.9  HGB 9.8*  HCT 33.7*  MCV 80.8  PLT 206   Cardiac Enzymes:  Recent Labs Lab 12/28/12 0047  TROPONINI <0.30   BNP:  Recent Labs Lab 12/28/12 0047  PROBNP 7922.0*   ABG: 7.39, 82, 57, 33  Studies/Results: No results found. Medications: I have reviewed the patient's current medications. Scheduled  Meds: . albuterol  2.5 mg Nebulization TID   And  . ipratropium  0.5 mg Nebulization TID  . allopurinol  100 mg Oral BID  . aspirin EC  81 mg Oral Daily  . atorvastatin  40 mg Oral Daily  . azithromycin  500 mg Oral Q24H  . FLUoxetine  20 mg Oral BID  . furosemide  80 mg Oral BID  . glipiZIDE  10 mg Oral BID AC  . insulin aspart  0-15 Units Subcutaneous TID WC  . levothyroxine  150 mcg Oral QAC breakfast  . mometasone-formoterol  2 puff Inhalation BID  . montelukast  10 mg Oral Daily  . pantoprazole  40 mg Oral Daily  . polyethylene glycol  17 g Oral Daily  . predniSONE  40 mg Oral Q breakfast  . sodium chloride  3 mL Intravenous Q12H  . warfarin  5 mg Oral ONCE-1800  . Warfarin - Pharmacist Dosing Inpatient   Does not apply q1800   Continuous Infusions:  PRN Meds:.sodium chloride, acetaminophen, albuterol, Ipratropium-Albuterol, sodium chloride  Assessment & Plan by Problem:  [name] is a 79yo F with history of  HF c/b AS and AR, pulm HTN, gout, afib on a/c, DM and chronic respiratory failure on 4L home O2 admitted on 12/28/12 with complaints of SOB with sputum producing cough.   #COPD exacerbation c/b CAP: Based on symptoms and evidenced by CXR.Marland Kitchen At admission, CURB 65 score is 2, suggesting 9% risk of death. Saturating well on 2L via . - Continue Azithro for a total of 7 d  - Follow sputum cx (negative), blood cx (negative), legionella(negative) & strep AG (neg)  - Titrate O2 to >92%  - Continue prednisone 40 mg qd PO  - dc foley  #Acute Encephalopathy-Resolved  2/2 to COPD exacerbation and hypercarbia (57, baseline 39). CT head negative. Ammonia normal   #Hypertensive Urgency-resolved: In the setting of history of HFPEF; BP 240/200 at admission. Likely due to fluid overload from missing meds today & acute stress; treated with lasix gtt 100mg  in ED.  - Continue Home lasix 80mg  PO BID  - BMET q24   #Gout: Appears to have acute flare in setting of acute infection   -Cont allopurinol, as patient already on for ppx  -Colchicine treatment complete  #Afib: rate controlled/bradycardic (chronic with h/o SSS, junctional rhythm on EKG at admission)  -Follow INR  -Cont coumadin per pharmacy   #DM: Last HbA1c 6.7 on 11/10/12. Hold home glipizide during hospitalization  -SSI mod   #Hypothyroidism: Last TSH 4.14 (wnl) on 06/26/12  -Cont home dose synthroid   #Stage 3 CKD: At baseline, Cr stable, will continue to monitor   #Normocytic Anemia: Stable (baseline ~10), monitor as needed   #VTE ppx: on coumadin   #Code Status: Full Code  Dispo: Disposition is deferred at this time, awaiting improvement of current medical problems.  Anticipated discharge in approximately 1 day(s).   The patient does have a current PCP (Aletta Edouard, MD) and does need an [facility] hospital follow-up appointment after discharge.  The patient does have transportation limitations that hinder transportation to clinic appointments.  .Services Needed at time of discharge: Y = Yes, Blank = No PT:   OT:   RN:   Equipment:   Other:     LOS: 3 days   Evelena Peat, DO 12/31/2012, 3:19 PM

## 2012-12-31 NOTE — Progress Notes (Signed)
Patient now up in recliner and is eating dinner. Patient states that her all pain in head and chest have gone away. Will continue to monitor to end of shift.

## 2012-12-31 NOTE — Progress Notes (Signed)
PULMONARY  / CRITICAL CARE MEDICINE  Name: ARISTEA POSADA MRN: [id number] DOB: 1932-08-15    ADMISSION DATE:  12/28/2012 CONSULTATION DATE:  12/28/2012  REFERRING MD :  Danise Edge PRIMARY SERVICE: IMTS  CHIEF COMPLAINT:  Acute encephalopathy  BRIEF PATIENT DESCRIPTION: 79 yo with multiple co-morbidities, presented to [facility] ED with dyspnea, productive cough, fever/chills x 4 days. Hypertensive (240/200) on admission.  Developed altered mental status and PCCM was consulted to evaluate.  SIGNIFICANT EVENTS / STUDIES:  11/3  Admitted with dyspnea and hypertension 11/4  Head CT >>> nad  LINES / TUBES:  CULTURES: 11/3 Sputum >>> 11/3 Blood >>>ng  ANTIBIOTICS: Azithromycin 11/3 >>> Ceftriaxone 11/3 >>> 11/5  SUBJECTIVE:  Wheezing, no distress  VITAL SIGNS: Temp:  [97.6 F (36.4 C)-98.7 F (37.1 C)] 98.6 F (37 C) (11/06 1501) Pulse Rate:  [65-75] 75 (11/06 1501) Resp:  [18-19] 18 (11/06 1501) BP: (140-152)/(54-87) 152/54 mmHg (11/06 1501) SpO2:  [92 %-98 %] 98 % (11/06 1501) Weight:  [121.246 kg (267 lb 4.8 oz)-121.882 kg (268 lb 11.2 oz)] 121.246 kg (267 lb 4.8 oz) (11/06 0555)    INTAKE / OUTPUT: Intake/Output     11/05 0701 - 11/06 0700 11/06 0701 - 11/07 0700   P.O. 480 480   I.V. (mL/kg) 3 (0)    IV Piggyback     Total Intake(mL/kg) 483 (4) 480 (4)   Urine (mL/kg/hr) 3400 (1.2) 1175 (1)   Total Output 3400 1175   Net -2917 -695         PHYSICAL EXAMINATION: General:  Obese female, lying in bed sleeping Neuro:  Awake, oriented to person/place/time. HEENT:  [place]/AT.  PERRL.  MMM. Cardiovascular:  RRR, no M/R/G. Lungs: wheezing mild diffuse Abdomen:  BS x 4.  Soft, NT/ND. Musculoskeletal:  No gross deformities.  No edema. Skin:  Warm and dry.   Recent Labs Lab 12/28/12 0046 12/28/12 0047 12/28/12 0615 12/28/12 0703 12/28/12 0800 12/28/12 1630 12/29/12 0447 12/29/12 1724 12/30/12 0610 12/30/12 1755 12/31/12 0530  HGB 9.8*  --   --   --   --   --   --   --    --   --   --   WBC 8.9  --   --   --   --   --   --   --   --   --   --   PLT 206  --   --   --   --   --   --   --   --   --   --   NA 138  --   --  140  --  135 135 137 136 135 140  K 4.3  --   --  3.8  --  4.2 4.2 4.1 4.3 4.0 3.4*  CL 95*  --   --  95*  --  91* 91* 94* 94* 92* 96  CO2 36*  --   --  33*  --  33* 35* 32 31 32 35*  GLUCOSE 135*  --   --  294*  --  323* 308* 256* 312* 250* 157*  BUN 30*  --   --  34*  --  36* 39* 44* 47* 47* 44*  CREATININE 1.05  --   --  1.10  --  1.12* 1.07 1.06 1.02 1.07 0.95  CALCIUM 10.1  --   --  10.0  --  10.0 10.2 10.1 10.1 9.8 9.7  MG  --   --   --  2.1  --  2.1 2.2 2.2 2.2  --   --   AST  --   --   --  28  --   --   --   --   --   --   --   ALT  --   --   --  17  --   --   --   --   --   --   --   ALKPHOS  --   --   --  120*  --   --   --   --   --   --   --   BILITOT  --   --   --  0.4  --   --   --   --   --   --   --   PROT  --   --   --  7.4  --   --   --   --   --   --   --   ALBUMIN  --   --   --  3.4*  --   --   --   --   --   --   --   INR  --   --  1.53*  --   --   --  1.31  --  1.62*  --  2.04*  TROPONINI  --  <0.30  --   --   --   --   --   --   --   --   --   PHART  --   --   --   --  7.390  --   --   --   --   --   --   PCO2ART  --   --   --   --  57.2*  --   --   --   --   --   --   PO2ART  --   --   --   --  82.0  --   --   --   --   --   --   HCO3  --   --   --   --  33.9*  --   --   --   --   --   --   O2SAT  --   --   --   --  95.7  --   --   --   --   --   --     Recent Labs Lab 12/30/12 2114 12/31/12 12/31/12 0649 12/31/12 1114 12/31/12 1611  GLUCAP 312* 266* 151* 260* 333*   CXR:  None today  ASSESSMENT / PLAN:  Pneumonia (CAP) Chronic hypoxemic / hypercarbic respiratory failure (CO2 appears at baseline) -remote smoker, doubt COPD, ? OHS Severe AS- AVA 0.8 cm2 Acute encephalopathy, likely delirium, resolved now, negative head CT Hyperglycemia - exacerbated by steroids  -->Upper airway pseudowheeze -ct  nebs -->  Continue Azithromycin however, any declines add ceftaz -->  Supplemental oxygen, goal SpO2>92% -->  Continue Prednisone 40, likely reduce in 24 hrs --> CXR in am. -->  Avoid benzodiazepines -->  Glycemic control per primary team --> PT/OT -->  Will need sleep study as outpt & PFTs to clarify physiology -lasix successful, neg 2 liters, renal fxn did well with this, maintain  Mcarthur Rossetti. Tyson Alias, MD, FACP Pgr: [phone] [facility]

## 2012-12-31 NOTE — Discharge Summary (Signed)
Name: [name] MRN: [id number] DOB: 11/01/1932 79 y.o. PCP: Aletta Edouard, MD  Date of Admission: 12/28/2012 12:32 AM Date of Discharge: 01/02/2013 Attending Physician: No att. providers found  Discharge Diagnosis: Principal Problem:   COPD exacerbation Active Problems:   HYPOTHYROIDISM   DIABETES MELLITUS   HYPERLIPIDEMIA   GOUT   OBESITY, MORBID   HYPERTENSION   Atrial fibrillation   COPD   CAD (coronary artery disease)   Aortic stenosis   CAP (community acquired pneumonia)   Hypertensive urgency   Encephalopathy acute   Chronic respiratory failure  Discharge Medications:   Medication List         acetaminophen 500 MG tablet  Commonly known as:  TYLENOL  Take 1,000 mg by mouth 2 (two) times daily as needed. For pain.     albuterol-ipratropium 18-103 MCG/ACT inhaler  Commonly known as:  COMBIVENT  Inhale 2 puffs into the lungs every 6 (six) hours as needed for wheezing or shortness of breath.     allopurinol 100 MG tablet  Commonly known as:  ZYLOPRIM  Take 1 tablet (100 mg total) by mouth 2 (two) times daily.     aspirin 81 MG EC tablet  Take 1 tablet (81 mg total) by mouth daily.     atorvastatin 40 MG tablet  Commonly known as:  LIPITOR  Take 40 mg by mouth daily.     azithromycin 500 MG tablet  Commonly known as:  ZITHROMAX  Take 1 tablet (500 mg total) by mouth daily.     colchicine 0.6 MG tablet  Take 0.6 mg by mouth See admin instructions. Take for 3 days when gout flares up     FLUoxetine 20 MG capsule  Commonly known as:  PROZAC  Take 1 capsule (20 mg total) by mouth 2 (two) times daily.     Fluticasone-Salmeterol 250-50 MCG/DOSE Aepb  Commonly known as:  ADVAIR  Inhale 1 puff into the lungs 2 (two) times daily.     furosemide 80 MG tablet  Commonly known as:  LASIX  Take 1 tablet (80 mg total) by mouth 2 (two) times daily.     gabapentin 100 MG capsule  Commonly known as:  NEURONTIN  Take 200 mg by mouth 2 (two) times  daily.     glipiZIDE 10 MG tablet  Commonly known as:  GLUCOTROL  Take 10 mg by mouth 2 (two) times daily.     levothyroxine 150 MCG tablet  Commonly known as:  SYNTHROID  Take 1 tablet (150 mcg total) by mouth daily.     loperamide 2 MG capsule  Commonly known as:  IMODIUM  Take 1 capsule (2 mg total) by mouth as needed for diarrhea or loose stools (up to total 16mg  daily).     LORazepam 1 MG tablet  Commonly known as:  ATIVAN  Take 1 tablet (1 mg total) by mouth at bedtime as needed for anxiety (for sleep).     montelukast 10 MG tablet  Commonly known as:  SINGULAIR  Take 1 tablet (10 mg total) by mouth daily.     omeprazole 20 MG capsule  Commonly known as:  PRILOSEC  TAKE TWO CAPSULES BY MOUTH ONCE DAILY     omeprazole 20 MG capsule  Commonly known as:  PRILOSEC  Take 40 mg by mouth daily.     polyethylene glycol powder powder  Commonly known as:  GLYCOLAX/MIRALAX  Take 17 g by mouth daily as needed (for constipation).  predniSONE 20 MG tablet  Commonly known as:  DELTASONE  Take 2 tablets (40 mg total) by mouth daily with breakfast. Take 2 tabs (40mg ) for 3 days and then take 1 tab (20mg ) until finished.     tiotropium 18 MCG inhalation capsule  Commonly known as:  SPIRIVA  Place 18 mcg into inhaler and inhale daily.     warfarin 5 MG tablet  Commonly known as:  COUMADIN  Take 5mg  every day (M,TU,TH,SAT,SUN) EXCEPT take 2.5mg  on Wednesday's        Disposition and follow-up:   Ms.Braleigh L [name] was discharged from [facility] in Good condition.  At the hospital follow up visit please address:  1.  Symptoms of SOB, completion of prednisone taper, HH services  2.  Labs / imaging needed at time of follow-up: none  3.  Pending labs/ test needing follow-up: none  Follow-up Appointments: Follow-up Information   Follow up with Genella Mech, MD. (Monday November 10th @ 9:00AM)    Specialty:  Internal Medicine   Contact information:     [address] [place] [address] [phone]       Discharge Instructions: Discharge Orders   Future Appointments Provider Department Dept Phone   01/04/2013 9:00 AM Judie Bonus, MD [facility] [phone]   01/05/2013 8:15 AM Sherrie George, MD [facility] [phone]   01/12/2013 3:30 PM Alvan Dame, DPM [facility] [phone]   02/08/2013 3:00 PM Quintella Reichert, MD [facility] [phone]   Future Orders Complete By Expires   (HEART FAILURE PATIENTS) Call MD:  Anytime you have any of the following symptoms: 1) 3 pound weight gain in 24 hours or 5 pounds in 1 week 2) shortness of breath, with or without a dry hacking cough 3) swelling in the hands, feet or stomach 4) if you have to sleep on extra pillows at night in order to breathe.  As directed    (HEART FAILURE PATIENTS) Call MD:  Anytime you have any of the following symptoms: 1) 3 pound weight gain in 24 hours or 5 pounds in 1 week 2) shortness of breath, with or without a dry hacking cough 3) swelling in the hands, feet or stomach 4) if you have to sleep on extra pillows at night in order to breathe.  As directed    Call MD for:  difficulty breathing, headache or visual disturbances  As directed    Call MD for:  persistant dizziness or light-headedness  As directed    Call MD for:  persistant dizziness or light-headedness  As directed    Call MD for:  persistant nausea and vomiting  As directed    Call MD for:  severe uncontrolled pain  As directed    Call MD for:  temperature >100.4  As directed    Call MD for:  temperature >100.4  As directed    Diet - low sodium heart healthy  As directed    Diet - low sodium heart healthy  As directed    Face-to-face encounter (required for Medicare/Medicaid patients)  As directed    Comments:     I Christen Bame certify that this patient is under my care and that I, or a nurse  practitioner or physician's assistant working with me, had a face-to-face encounter that meets the physician face-to-face encounter requirements with this patient on 12/31/2012. The encounter with the patient was in  whole, or in part for the following medical condition(s) which is the primary reason for home health care (List medical condition): COPD, AS, CHF   Questions:     The encounter with the patient was in whole, or in part, for the following medical condition, which is the primary reason for home health care:  COPD   I certify that, based on my findings, the following services are medically necessary home health services:  Physical therapy   My clinical findings support the need for the above services:  Unable to leave home safely without assistance and/or assistive device   Further, I certify that my clinical findings support that this patient is homebound due to:  Shortness of Breath with activity   Reason for Medically Necessary Home Health Services:  Therapy- Home Adaptation to Facilitate Safety   Home Health  As directed    Questions:     To provide the following care/treatments:  Home Health Aide   PT   OT   Increase activity slowly  As directed    Increase activity slowly  As directed       Consultations:   Critical Care   Procedures Performed:  Dg Chest 2 View  12/28/2012   CLINICAL DATA:  Shortness of breath, weakness, history coronary artery disease, hyperlipidemia, type 2 diabetes, essential benign hypertension, DVT, pulmonary embolism  EXAM: CHEST  2 VIEW  COMPARISON:  12/28/2012  FINDINGS: Enlargement of cardiac silhouette with pulmonary vascular congestion.  Chronic elevation of right diaphragm.  Calcified mildly tortuous thoracic aorta.  Peribronchial thickening with question perihilar infiltrates.  Improved aeration left lower lobe.  No gross pleural effusion or pneumothorax.  Bones demineralized.  IMPRESSION: Enlargement of cardiac silhouette with pulmonary vascular  congestion.  Bronchitic changes with questionable mild perihilar infiltrates   Electronically Signed   By: Ulyses Southward M.D.   On: 12/28/2012 15:39   Ct Head Wo Contrast  12/29/2012   CLINICAL DATA:  History of altered and decreased mental status. History of acute encephalopathy.  EXAM: CT HEAD WITHOUT CONTRAST  TECHNIQUE: Contiguous axial images were obtained from the base of the skull through the vertex without intravenous contrast.  COMPARISON:  CT 10/02/2010.  FINDINGS: There is no evidence of brain mass, brain hemorrhage, or acute infarction. There is hypodensity consistent with previous remote infarction and encephalomalacia in the left frontal region. Moderate atrophic changes are seen. Stable periventricular and white matter hypodensity microvascular ischemic changes are seen. Non aneurysmal arterial calcifications are present.  The ventricular system is normal size and shape. There is no evidence of shift of midline structures, parenchymal lesion, or subdural or epidural hematoma.  The calvarium is intact. Mastoids are well aerated. There is minimal bilateral maxillary mucosal thickening right greater than left.  IMPRESSION: There is no evidence of brain mass, brain hemorrhage, or acute infarction. There is hypodensity consistent with previous remote infarction and encephalomalacia in the left frontal region. Moderate atrophic changes are seen. Stable periventricular and white matter hypodensity microvascular ischemic changes are seen. Non aneurysmal arterial calcifications are present.  No acute or active brain process is seen. No skull lesion is evident. There is minimal bilateral maxillary mucosal thickening right greater than left.   Electronically Signed   By: Onalee Hua  Call M.D.   On: 12/29/2012 11:02   US Breast Bilateral  12/04/2012   *RADIOLOGY REPORT*  Clinical Data:  79 year old female complaining of a palpable abnormality in the right breast.  The patient is on Coumadin and states that  she  had a bruise in this area that has resolved and the palpable abnormality in this area is getting smaller in size.  DIGITAL DIAGNOSTIC BILATERAL MAMMOGRAM WITH CAD AND BILATERAL BREAST ULTRASOUND:  Comparison:  None.  Findings:  ACR Breast Density Category b:  There are scattered areas of fibroglandular density.  There is no suspicious mass or malignant-type microcalcifications in the right breast.  Asymmetric fibroglandular tissue is seen in the medial aspect of the left breast.  There are no malignant-type microcalcifications.  Mammographic images were processed with CAD.  On physical exam, I palpate a BB sized nodule in the right breast at 12:30 12 cm from the nipple.  I do not palpate a mass in the left breast.  Ultrasound is performed, showing a hyperechoic nodule in the right breast at 12:30 12 cm from the nipple measuring 4 x 3 x 7 mm.  This is likely post-traumatic.  Sonographic evaluation of the medial aspect of the left breast shows normal tissue.  IMPRESSION: Probable benign asymmetric tissue in the medial aspect of the left breast and probable post-traumatic changes in the 12:30 region of the right breast.  RECOMMENDATION: Short-term interval follow-up left mammogram and right breast ultrasound in 6 months is recommended.  I have discussed the findings and recommendations with the patient. Results were also provided in writing at the conclusion of the visit.  If applicable, a reminder letter will be sent to the patient regarding the next appointment.  BI-RADS CATEGORY 3:  Probably benign finding(s) - short interval follow-up suggested.   Original Report Authenticated By: Baird Lyons, M.D.   Dg Chest Port 1 View  12/28/2012   CLINICAL DATA:  Productive cough and shortness of breath.  EXAM: PORTABLE CHEST - 1 VIEW  COMPARISON:  CHEST x-ray 09/03/2012.  FINDINGS: Opacity at the base of the left hemithorax may reflect atelectasis and/or consolidation. Possible small left pleural effusion. Diffuse  interstitial prominence. No definite cephalization of the pulmonary vasculature. Heart size appears mildly enlarged. The patient is rotated to the left on today's exam, resulting in distortion of the mediastinal contours and reduced diagnostic sensitivity and specificity for mediastinal pathology. Atherosclerosis in the thoracic aorta.  IMPRESSION: 1. Left retrocardiac opacity may reflect atelectasis and/or consolidation in the left lower lobe. Possible small left pleural effusion. 2. Mild diffuse interstitial prominence is of uncertain etiology and significance, and may in part be technique related. 3. Mild cardiomegaly. 4. Atherosclerosis.   Electronically Signed   By: Trudie Reed M.D.   On: 12/28/2012 01:07   Mm Digital Diagnostic Bilat  12/04/2012   *RADIOLOGY REPORT*  Clinical Data:  79 year old female complaining of a palpable abnormality in the right breast.  The patient is on Coumadin and states that she had a bruise in this area that has resolved and the palpable abnormality in this area is getting smaller in size.  DIGITAL DIAGNOSTIC BILATERAL MAMMOGRAM WITH CAD AND BILATERAL BREAST ULTRASOUND:  Comparison:  None.  Findings:  ACR Breast Density Category b:  There are scattered areas of fibroglandular density.  There is no suspicious mass or malignant-type microcalcifications in the right breast.  Asymmetric fibroglandular tissue is seen in the medial aspect of the left breast.  There are no malignant-type microcalcifications.  Mammographic images were processed with CAD.  On physical exam, I palpate a BB sized nodule in the right breast at 12:30 12 cm from the nipple.  I do not palpate a mass in the left breast.  Ultrasound is performed, showing  a hyperechoic nodule in the right breast at 12:30 12 cm from the nipple measuring 4 x 3 x 7 mm.  This is likely post-traumatic.  Sonographic evaluation of the medial aspect of the left breast shows normal tissue.  IMPRESSION: Probable benign asymmetric  tissue in the medial aspect of the left breast and probable post-traumatic changes in the 12:30 region of the right breast.  RECOMMENDATION: Short-term interval follow-up left mammogram and right breast ultrasound in 6 months is recommended.  I have discussed the findings and recommendations with the patient. Results were also provided in writing at the conclusion of the visit.  If applicable, a reminder letter will be sent to the patient regarding the next appointment.  BI-RADS CATEGORY 3:  Probably benign finding(s) - short interval follow-up suggested.   Original Report Authenticated By: Baird Lyons, M.D.   Admission HPI: MANHATTAN MCCUEN is a 79 year old female with a PMH of HFPEF, moderate AS, DM w/ neuropathy, Hypothyroidism, HTN, A fib with chronic A/C, COPD (on 4L [place] home O2), CKD stage 3. She presents to [facility] after a 4 day history of increased SOB, productive cough (greenish/yellow sputum), subjective fever and chills. She also reports some generalized muscle aches and confusion. She reports that her nursing aid has recently been sick with "the flu." She has received her flu shot this year. She denies any chest pain or discomfort. She also reports some pain in her left and right big toes that has been going on for over a week.   Hospital Course by problem list:   COPD Exacerbation: Pt was initially on higher levels of O2 and then become unresponsive and returned to baseline after O2 requirement decreased to 2L instead of home 4L. She continued to improve with duonebs and was placed on Azithromycin PO for 7 days total for pneumonia coverage. Sputum cultures were normal flora along with Blood, Urine and Legionella cultures negative. She was discharged with 5 days Prednisone 40mg  PO QD taper.    #Acute Encephalopathy: Patient's mental status continues to improve this AM. Confusion likely due to hypercarbia on admission. CT scan completed on 12/29/12 with unremarkable findings. She improved with  decreased O2 requirements. PT and OT therapy recommend SNF at discharge and PT-3X/wk and OT-2X/wk. But pt refused and went home with [facility] services.   #Hypertensive Urgency: Patient's vital signs have remained stable since admission. Hypertension on admission likely related to fluid retention after missing lasix. Patient treated in ED with Lasix 100mg . And then just continued on home BP meds.   #Atrial Fibrillation: Patient had initial subtherapeutic INR but warfarin titrated and she continued be rate controlled with bradycardia and RBBB.  #DM: Blood glucose well controlled with glipizide 10mg  PO BID.   #Aortic Stenosis: Dr Mayford Knife evaluated the pt in July 2014 admission when the most recent ECHO was performed and felt her AS was more moderate rather than severe and rec med mgmt. Plan to manage as outpatient post discharge.   Discharge Vitals:   BP 143/56  Pulse 75  Temp(Src) 99.1 F (37.3 C) (Oral)  Resp 18  Ht 5\' 1"  (1.549 m)  Wt 262 lb 11.2 oz (119.16 kg)  BMI 49.66 kg/m2  SpO2 95%  LMP 05/22/1968 General: resting in bed in NAD  Cardiac: RRR, no rubs, murmurs or gallops  Pulm: +wheezing, moving normal volumes of air  Abd: soft, nontender, nondistended, BS present  Ext: warm and well perfused, no LE edema  Neuro: alert and oriented X3, mentating appropriately  Discharge Labs:  Results for orders placed during the hospital encounter of 12/28/12 (from the past 24 hour(s))  GLUCOSE, CAPILLARY     Status: Abnormal   Collection Time    01/01/13 12:05 PM      Result Value Range   Glucose-Capillary 265 (*) 70 - 99 mg/dL    Signed: Christen Bame, MD 01/02/2013, 11:10 AM   Time Spent on Discharge: 35 minutes Services Ordered on Discharge: HH PT, OT, RN, aide Equipment Ordered on Discharge: none

## 2012-12-31 NOTE — Progress Notes (Signed)
I have reviewed the note by Student Doctor Julien Girt and agree with his findings.  Please see my note as well.

## 2012-12-31 NOTE — Progress Notes (Signed)
ANTICOAGULATION CONSULT NOTE - Follow-up  Pharmacy Consult for Warfarin  Indication: atrial fibrillation  Allergies  Allergen Reactions  . Lantus [Insulin Glargine]     Causes a lot of itching  . Nitrofurantoin [Macrodantin] Itching    Patient Measurements: Height: 5\' 1"  (154.9 cm) Weight: 267 lb 4.8 oz (121.246 kg) IBW/kg (Calculated) : 47.8  Vital Signs: Temp: 98.6 F (37 C) (11/06 0921) Temp src: Oral (11/06 0921) BP: 148/78 mmHg (11/06 0921) Pulse Rate: 65 (11/06 0921)  Labs:  Recent Labs  12/29/12 0447  12/30/12 0610 12/30/12 1755 12/31/12 0530  LABPROT 16.0*  --  18.8*  --  22.4*  INR 1.31  --  1.62*  --  2.04*  CREATININE 1.07  < > 1.02 1.07 0.95  < > = values in this interval not displayed.  Estimated Creatinine Clearance: 59.5 ml/min (by C-G formula based on Cr of 0.95).  Assessment: 79 y/o F on chronic coumadin for afib and hx of DVT/PE INR 2.04 < 1.62. No new cbc, no bleeding noted per chart. On D#4 azithromycin  PTA dose: 5mg  daily except for 2.5mg  on Wed per last coumadin clinic note  Goal of Therapy:  INR 2-3   Plan:  - Warfarin 5mg  PO x 1 tonight - F/u AM INR  Thank you for allowing pharmacy to be a part of this patients care team.  Bayard Hugger, PharmD, BCPS  Clinical Pharmacist  Pager: [phone]

## 2013-01-01 ENCOUNTER — Inpatient Hospital Stay (HOSPITAL_COMMUNITY): Payer: Medicare PPO

## 2013-01-01 ENCOUNTER — Other Ambulatory Visit: Payer: Self-pay | Admitting: Internal Medicine

## 2013-01-01 LAB — PROTIME-INR
INR: 2.42 — ABNORMAL HIGH (ref 0.00–1.49)
Prothrombin Time: 25.5 seconds — ABNORMAL HIGH (ref 11.6–15.2)

## 2013-01-01 LAB — BASIC METABOLIC PANEL
Calcium: 9.7 mg/dL (ref 8.4–10.5)
GFR calc Af Amer: 64 mL/min — ABNORMAL LOW (ref >90)
GFR calc non Af Amer: 55 mL/min — ABNORMAL LOW (ref >90)
Potassium: 3.4 mEq/L — ABNORMAL LOW (ref 3.5–5.1)
Sodium: 139 mEq/L (ref 135–145)

## 2013-01-01 MED ORDER — ALBUTEROL SULFATE (5 MG/ML) 0.5% IN NEBU
2.5000 mg | INHALATION_SOLUTION | RESPIRATORY_TRACT | Status: DC | PRN
  Filled 2013-01-01: qty 0.5

## 2013-01-01 MED ORDER — AZITHROMYCIN 500 MG PO TABS: 500.0000 mg | ORAL_TABLET | ORAL | Status: DC

## 2013-01-01 MED ORDER — IPRATROPIUM BROMIDE 0.02 % IN SOLN
0.5000 mg | RESPIRATORY_TRACT | Status: DC | PRN
  Filled 2013-01-01: qty 2.5

## 2013-01-01 MED ORDER — WARFARIN SODIUM 5 MG PO TABS
5.0000 mg | ORAL_TABLET | Freq: Once | ORAL | Status: DC
  Filled 2013-01-01: qty 1

## 2013-01-01 NOTE — Progress Notes (Signed)
  Date: 01/01/2013  Patient name: [name]  Medical record number: [id number]  Date of birth: [birth year]   This patient has been seen and the plan of care was discussed with the house staff. Please see their note for complete details. I concur with their findings with the following additions/corrections: [name] is stable fro D/C home today. Her breathing is very mildly labored, she has moderate air movement, occass crackles in R mid lung. This is her baseline for pul exam and sxs. She refuses SNF. HHPT / OT / aide / med mgmt / [facility]. F/U 10th in [facility].  Burns Spain, MD 01/01/2013, 12:44 PM

## 2013-01-01 NOTE — Progress Notes (Signed)
Subjective: [name] was seen and examined by me this morning.  She feels better.  Her dyspnea has improved and she denies CP.  She still has a cough.  Her appetite is good.  +BM this morning.  No urinary complaints.  Objective: Vital signs in last 24 hours: Filed Vitals:   12/31/12 2009 01/01/13 0504 01/01/13 0551 01/01/13 0835  BP: 158/79 176/71 160/70   Pulse: 67 67    Temp: 97.5 F (36.4 C) 98.1 F (36.7 C)    TempSrc: Oral Oral    Resp: 18 20    Height:      Weight:  119.16 kg (262 lb 11.2 oz)    SpO2: 99% 92%  92%   Weight change: -2.722 kg (-6 lb)  Intake/Output Summary (Last 24 hours) at 01/01/13 1319 Last data filed at 01/01/13 1303  Gross per 24 hour  Intake   1180 ml  Output    625 ml  Net    555 ml   General: resting in bed in NAD Cardiac: RRR, no rubs, murmurs or gallops Pulm: +wheezing, moving normal volumes of air Abd: soft, nontender, nondistended, BS present Ext: warm and well perfused, no LE edema Neuro: alert and oriented X3, mentating appropriately  Lab Results: Basic Metabolic Panel:  Recent Labs Lab 12/29/12 1724 12/30/12 0610  12/31/12 1555 01/01/13 0605  NA 137 136  < > 139 139  K 4.1 4.3  < > 3.7 3.4*  CL 94* 94*  < > 96 95*  CO2 32 31  < > 30 36*  GLUCOSE 256* 312*  < > 334* 128*  BUN 44* 47*  < > 43* 37*  CREATININE 1.06 1.02  < > 1.14* 0.96  CALCIUM 10.1 10.1  < > 9.4 9.7  MG 2.2 2.2  --   --   --   < > = values in this interval not displayed. Liver Function Tests:  Recent Labs Lab 12/28/12 0703  AST 28  ALT 17  ALKPHOS 120*  BILITOT 0.4  PROT 7.4  ALBUMIN 3.4*    Recent Labs Lab 12/28/12 1150  AMMONIA <10*   CBC:  Recent Labs Lab 12/28/12 0046  WBC 8.9  HGB 9.8*  HCT 33.7*  MCV 80.8  PLT 206   Cardiac Enzymes:  Recent Labs Lab 12/28/12 0047  TROPONINI <0.30   BNP:  Recent Labs Lab 12/28/12 0047  PROBNP 7922.0*   ABG: 7.39, 82, 57, 33  Studies/Results: Dg Chest 2 View  01/01/2013    CLINICAL DATA:  Shortness of breath and weakness.  EXAM: CHEST  2 VIEW  COMPARISON:  12/28/2012  FINDINGS: Stable asymmetric elevation of the right hemidiaphragm. The cardio pericardial silhouette is enlarged. Underlying interstitial changes with vascular congestion noted. No evidence for pleural effusion. No dense airspace consolidation. Imaged bony structures of the thorax are intact.  IMPRESSION: Cardiomegaly with vascular congestion and underlying chronic interstitial changes. Component of associated interstitial edema not excluded.   Electronically Signed   By: Kennith Center M.D.   On: 01/01/2013 11:30   Medications: I have reviewed the patient's current medications. Scheduled Meds: . albuterol  2.5 mg Nebulization TID   And  . ipratropium  0.5 mg Nebulization TID  . allopurinol  100 mg Oral BID  . aspirin EC  81 mg Oral Daily  . atorvastatin  40 mg Oral Daily  . azithromycin  500 mg Oral Q24H  . FLUoxetine  20 mg Oral BID  . furosemide  80  mg Oral BID  . glipiZIDE  10 mg Oral BID AC  . insulin aspart  0-15 Units Subcutaneous TID WC  . levothyroxine  150 mcg Oral QAC breakfast  . mometasone-formoterol  2 puff Inhalation BID  . montelukast  10 mg Oral Daily  . pantoprazole  40 mg Oral Daily  . polyethylene glycol  17 g Oral Daily  . predniSONE  40 mg Oral Q breakfast  . sodium chloride  3 mL Intravenous Q12H  . warfarin  5 mg Oral ONCE-1800  . Warfarin - Pharmacist Dosing Inpatient   Does not apply q1800   Continuous Infusions:  PRN Meds:.sodium chloride, acetaminophen, albuterol, Ipratropium-Albuterol, sodium chloride  Assessment & Plan by Problem:  [name] is a 79yo F with history of HF c/b AS and AR, pulm HTN, gout, afib on a/c, DM and chronic respiratory failure on 4L home O2 admitted on 12/28/12 with complaints of SOB with sputum producing cough.   #COPD exacerbation c/b CAP: Based on symptoms and evidenced by CXR.Marland Kitchen At admission, CURB 65 score is 2, suggesting 9% risk of  death. Saturating well on 2L via Melbeta. - Continue Azithro for a total of 7 d  - Prednisone taper at d/c   #Acute Encephalopathy-Resolved  2/2 to COPD exacerbation and hypercarbia (57, baseline 39). CT head negative. Ammonia normal   #Hypertensive Urgency-resolved: In the setting of history of HFPEF; BP 240/200 at admission. Likely due to fluid overload from missing meds today & acute stress; treated with lasix gtt 100mg  in ED.  - Resume 80mg  BID Lasix at d/c  #Gout: Appears to have acute flare in setting of acute infection  -Cont allopurinol, as patient already on for ppx  -Colchicine treatment complete  #Afib: rate controlled/bradycardic (chronic with h/o SSS, junctional rhythm on EKG at admission)  -Cont coumadin per pharmacy   #DM: Last HbA1c 6.7 on 11/10/12.  - Resume glipizide at d/c  #Hypothyroidism: Last TSH 4.14 (wnl) on 06/26/12  -Resume synthroid at d/c  #Stage 3 CKD: At baseline, Cr stable  #Normocytic Anemia: Stable (baseline ~10), monitor as needed   #VTE ppx: on coumadin   #Code Status: Full Code  Dispo: Patient has improved and is feeling well enough for discharge.  She declines SNF and will be discharged to home with home health PT/OT/aide/med management and [facility].  Hospital follow-up appointment on 01/04/13 in [facility].  The patient does have a current PCP (Aletta Edouard, MD) and does need an [facility] hospital follow-up appointment after discharge.  The patient does have transportation limitations that hinder transportation to clinic appointments.  .Services Needed at time of discharge: Y = Yes, Blank = No PT:   OT:   RN:   Equipment:   Other:     LOS: 4 days   Evelena Peat, DO 01/01/2013, 1:19 PM

## 2013-01-01 NOTE — Progress Notes (Signed)
ANTICOAGULATION CONSULT NOTE - Follow-up  Pharmacy Consult for Warfarin  Indication: atrial fibrillation  Allergies  Allergen Reactions  . Lantus [Insulin Glargine]     Causes a lot of itching  . Nitrofurantoin [Macrodantin] Itching    Patient Measurements: Height: 5\' 1"  (154.9 cm) Weight: 262 lb 11.2 oz (119.16 kg) (scale c) IBW/kg (Calculated) : 47.8  Vital Signs: Temp: 98.1 F (36.7 C) (11/07 0504) Temp src: Oral (11/07 0504) BP: 160/70 mmHg (11/07 0551) Pulse Rate: 67 (11/07 0504)  Labs:  Recent Labs  12/30/12 0610  12/31/12 0530 12/31/12 1555 01/01/13 0605  LABPROT 18.8*  --  22.4*  --  25.5*  INR 1.62*  --  2.04*  --  2.42*  CREATININE 1.02  < > 0.95 1.14* 0.96  < > = values in this interval not displayed.  Estimated Creatinine Clearance: 58.3 ml/min (by C-G formula based on Cr of 0.96).  Assessment: 79 y/o F on chronic coumadin for afib and hx of DVT/PE INR 2.42. No new cbc, no bleeding noted per chart. On D#5 azithromycin  PTA dose: 5mg  daily except for 2.5mg  on Wed per last coumadin clinic note  Goal of Therapy:  INR 2-3   Plan:  - Warfarin 5mg  PO x 1 tonight - F/u AM INR  Thank you for allowing pharmacy to be a part of this patients care team.  Bayard Hugger, PharmD, BCPS  Clinical Pharmacist  Pager: [phone]

## 2013-01-01 NOTE — Progress Notes (Signed)
Patient evaluated for community based chronic disease management services with [facility] as a benefit of patient's [employer]. Spoke with patient at bedside to explain [facility] Care Management services.  She has previously refused services for CHF management.  She submits to the fact that with four readmissions in 6 months that she is not stable.  She currently can not stand on her scale at home.  We will initiate a circumference measurement program to assess fluid gains in the home.  Written consents obtained. Patients son will be the primary contact for transition calls per patient's request.  Patient will receive a post discharge transition of care call and will be evaluated for monthly home visits for assessments and disease process education.  Left contact information and [facility] literature at bedside. Made Inpatient Case Manager aware that [facility] Care Management following. Of note, [facility] Care Management services does not replace or interfere with any services that are arranged by inpatient case management or social work.  For additional questions or referrals please contact Anibal Henderson BSN RN [facility] Liaison at [phone].

## 2013-01-01 NOTE — Progress Notes (Signed)
PULMONARY  / CRITICAL CARE MEDICINE  Name: [name] MRN: [id number] DOB: 10-Oct-1932    ADMISSION DATE:  12/28/2012 CONSULTATION DATE:  12/28/2012  REFERRING MD :  Danise Edge PRIMARY SERVICE: IMTS  CHIEF COMPLAINT:  Acute encephalopathy  BRIEF PATIENT DESCRIPTION:  79 yo female with progressive productive cough, dyspnea, fever and chills associated with muscle aches and confusion.  Had BP 223/195 in ED.  PCCM consulted to assist with management.  SIGNIFICANT EVENTS: 11/3  Admitted with dyspnea and hypertension  STUDIES: 11/4  Head CT >>> nad  LINES / TUBES:  CULTURES: 11/3 Sputum >>> 11/3 Blood >>>ng  ANTIBIOTICS: Azithromycin 11/3 >>> Ceftriaxone 11/3 >>> 11/5  SUBJECTIVE:   no distress  VITAL SIGNS: Temp:  [97.5 F (36.4 C)-98.6 F (37 C)] 98.1 F (36.7 C) (11/07 0504) Pulse Rate:  [67-79] 67 (11/07 0504) Resp:  [18-20] 20 (11/07 0504) BP: (152-176)/(54-99) 160/70 mmHg (11/07 0551) SpO2:  [92 %-99 %] 92 % (11/07 0835) Weight:  [262 lb 11.2 oz (119.16 kg)] 262 lb 11.2 oz (119.16 kg) (11/07 0504) 2 liters [place]  INTAKE / OUTPUT: Intake/Output     11/06 0701 - 11/07 0700 11/07 0701 - 11/08 0700   P.O. 960 220   I.V. (mL/kg)     Total Intake(mL/kg) 960 (8.1) 220 (1.8)   Urine (mL/kg/hr) 1175 (0.4)    Total Output 1175     Net -215 +220        Urine Occurrence 6 x    Stool Occurrence 1 x     PHYSICAL EXAMINATION: General:  Obese female, lying in bed NAD on O2 Neuro:  Awake, oriented to person/place/time. HEENT:  [place]/AT.  PERRL.  MMM. Cardiovascular:  RRR, no M/R/G. Lungs: decreased bs Abdomen:  BS x 4.  Soft, NT/ND. Musculoskeletal:  No gross deformities.  No edema. Skin:  Warm and dry.  CBC No results found for this basename: WBC, HGB, HCT, PLT,  in the last 72 hours  Coag's Recent Labs     12/30/12  0610  12/31/12  0530  01/01/13  0605  INR  1.62*  2.04*  2.42*    BMET Recent Labs     12/31/12  0530  12/31/12  1555  01/01/13  0605  NA   140  139  139  K  3.4*  3.7  3.4*  CL  96  96  95*  CO2  35*  30  36*  BUN  44*  43*  37*  CREATININE  0.95  1.14*  0.96  GLUCOSE  157*  334*  128*    Electrolytes Recent Labs     12/29/12  1724  12/30/12  0610   12/31/12  0530  12/31/12  1555  01/01/13  0605  CALCIUM  10.1  10.1   < >  9.7  9.4  9.7  MG  2.2  2.2   --    --    --    --    < > = values in this interval not displayed.    Glucose Recent Labs     12/31/12  0649  12/31/12  1114  12/31/12  1611  12/31/12  2054  01/01/13  0537  01/01/13  1205  GLUCAP  151*  260*  333*  261*  144*  265*    Imaging Dg Chest 2 View  01/01/2013   CLINICAL DATA:  Shortness of breath and weakness.  EXAM: CHEST  2 VIEW  COMPARISON:  12/28/2012  FINDINGS:  Stable asymmetric elevation of the right hemidiaphragm. The cardio pericardial silhouette is enlarged. Underlying interstitial changes with vascular congestion noted. No evidence for pleural effusion. No dense airspace consolidation. Imaged bony structures of the thorax are intact.  IMPRESSION: Cardiomegaly with vascular congestion and underlying chronic interstitial changes. Component of associated interstitial edema not excluded.   Electronically Signed   By: Kennith Center M.D.   On: 01/01/2013 11:30        ASSESSMENT / PLAN:  A: Acute respiratory failure most likely due to pulmonary edema from HTN emergency, and possible CAP. Reported hx of COPD >> she is not sure inhalers help her breathing. Hx of chronic hypoxia/hypercapnia >> likely from OHS. P: -oxygen to keep SpO2 > 92% -f/u CXR as needed -Abx per primary team -change albuterol/atrovent to prn only -d/c spiriva, dulera, singulair -wean off prednisone as tolerated  A: Severe AS- AVA 0.8 cm2. HTN emergency >> resolved. Hx of diastolic heart failure, hyperlipidemia, A fib. P: -per primary team  A: Acute encephalopathy 2nd to respiratory failure, HTN emergency >> improved. P: -per primary team  PCCM will  f/u on Monday, 01/04/13 if she is still in hospital >> call if help needed sooner.  Brett Canales Minor ACNP [facility] PCCM Pager [phone] till 3 pm If no answer page [phone] 01/01/2013, 11:40 AM  Reviewed above, examined pt, and documentation changes made as needed.  Coralyn Helling, MD [facility] Pulmonary/Critical Care 01/01/2013, 2:34 PM Pager:  [phone] After 3pm call: [phone]

## 2013-01-03 LAB — CULTURE, BLOOD (ROUTINE X 2): Culture: NO GROWTH

## 2013-01-04 ENCOUNTER — Encounter: Payer: Self-pay | Admitting: Internal Medicine

## 2013-01-04 ENCOUNTER — Ambulatory Visit (INDEPENDENT_AMBULATORY_CARE_PROVIDER_SITE_OTHER): Payer: Medicare PPO | Admitting: Internal Medicine

## 2013-01-04 ENCOUNTER — Ambulatory Visit: Payer: Medicare PPO | Admitting: Internal Medicine

## 2013-01-04 ENCOUNTER — Ambulatory Visit (INDEPENDENT_AMBULATORY_CARE_PROVIDER_SITE_OTHER): Payer: Medicare PPO | Admitting: Pharmacist

## 2013-01-04 VITALS — BP 133/49 | HR 77 | Temp 98.1°F

## 2013-01-04 DIAGNOSIS — I10 Essential (primary) hypertension: Secondary | ICD-10-CM

## 2013-01-04 DIAGNOSIS — E1149 Type 2 diabetes mellitus with other diabetic neurological complication: Secondary | ICD-10-CM

## 2013-01-04 DIAGNOSIS — E119 Type 2 diabetes mellitus without complications: Secondary | ICD-10-CM

## 2013-01-04 DIAGNOSIS — R7309 Other abnormal glucose: Secondary | ICD-10-CM

## 2013-01-04 DIAGNOSIS — Z7901 Long term (current) use of anticoagulants: Secondary | ICD-10-CM

## 2013-01-04 DIAGNOSIS — R739 Hyperglycemia, unspecified: Secondary | ICD-10-CM

## 2013-01-04 DIAGNOSIS — J441 Chronic obstructive pulmonary disease with (acute) exacerbation: Secondary | ICD-10-CM

## 2013-01-04 DIAGNOSIS — E1142 Type 2 diabetes mellitus with diabetic polyneuropathy: Secondary | ICD-10-CM

## 2013-01-04 LAB — GLUCOSE, CAPILLARY
Glucose-Capillary: 452 mg/dL — ABNORMAL HIGH (ref 70–99)
Glucose-Capillary: 488 mg/dL — ABNORMAL HIGH (ref 70–99)

## 2013-01-04 LAB — POCT INR: INR: 1.5

## 2013-01-04 MED ORDER — INSULIN ASPART 100 UNIT/ML ~~LOC~~ SOLN
5.0000 [IU] | Freq: Once | SUBCUTANEOUS | Status: AC
  Administered 2013-01-04: 5 [IU] via SUBCUTANEOUS

## 2013-01-04 MED ORDER — FREESTYLE SYSTEM KIT: 1.0000 | PACK | Status: DC | PRN

## 2013-01-04 NOTE — Patient Instructions (Addendum)
General Instructions:  Please follow up with donna plyler as soon as possible and also follow up with your pcp on next available  Please complete your prednisone taper, you should be done with all your tablets by 01/08/13  Please get a blood sugar meter right away and check your blood sugars three times a day. You need to come and meet our diabetes educator as soon as possible so we can check your blood sugars. If your sugars are >200 for the week, call us and let us know [phone]  If you start noticing a lot of increased thirst, sweating, fever, chills, worsening cough, chest pain, or shortness of breath, call and let us know or if severe go to ED.   Treatment Goals:  Goals (1 Years of Data) as of 01/04/13         11/10/12 06/26/12 03/31/12 11/28/11     Result Component    . HEMOGLOBIN A1C < 8  6.7 6.4 7.0 7.5      Progress Toward Treatment Goals:  Treatment Goal 01/04/2013  Hemoglobin A1C at goal  Blood pressure at goal  Prevent falls deteriorated    Self Care Goals & Plans:  Self Care Goal 01/04/2013  Manage my medications take my medicines as prescribed  Monitor my health -  Eat healthy foods -  Be physically active -    Home Blood Glucose Monitoring 01/04/2013  Check my blood sugar 3 times a day  When to check my blood sugar before meals    Care Management & Community Referrals:  No flowsheet data found.

## 2013-01-04 NOTE — Progress Notes (Signed)
Anti-Coagulation Progress Note  [name] is a 79 y.o. female who is currently on an anti-coagulation regimen.    RECENT RESULTS: Recent results are below, the most recent result is correlated with a dose of 32.5 mg. per week: Lab Results  Component Value Date   INR 1.50 01/04/2013   INR 2.42* 01/01/2013   INR 2.04* 12/31/2012    ANTI-COAG DOSE: Anticoagulation Dose Instructions as of 01/04/2013     Glynis Smiles Tue Wed Thu Fri Sat   New Dose 5 mg 7.5 mg 5 mg 5 mg 5 mg 5 mg 5 mg       ANTICOAG SUMMARY: Anticoagulation Episode Summary   Current INR goal 2.0-3.0  Next INR check 01/25/2013  INR from last check 1.50! (01/04/2013)  Weekly max dose   Target end date Indefinite  INR check location   Preferred lab   Send INR reminders to ANTICOAG IMP   Indications  Long-term (current) use of anticoagulants [V58.61] AF (atrial fibrillation) (Resolved) [427.31]        Comments       Anticoagulation Care Providers   Provider Role Specialty Phone number   Ulyess Mort, MD  Internal Medicine [phone]      ANTICOAG TODAY: Anticoagulation Summary as of 01/04/2013   INR goal 2.0-3.0  Selected INR 1.50! (01/04/2013)  Next INR check 01/25/2013  Target end date Indefinite   Indications  Long-term (current) use of anticoagulants [V58.61] AF (atrial fibrillation) (Resolved) [427.31]      Anticoagulation Episode Summary   INR check location    Preferred lab    Send INR reminders to ANTICOAG IMP   Comments     Anticoagulation Care Providers   Provider Role Specialty Phone number   Ulyess Mort, MD  Internal Medicine [phone]      PATIENT INSTRUCTIONS: Patient Instructions  Patient instructed to take medications as defined in the Anti-coagulation Track section of this encounter.  Patient instructed to take today's dose.  Patient verbalized understanding of these instructions.       FOLLOW-UP No Follow-up on file.  Hulen Luster, III Pharm.D., CACP

## 2013-01-04 NOTE — Assessment & Plan Note (Signed)
BP Readings from Last 3 Encounters:  01/04/13 133/49  01/01/13 143/56  12/10/12 149/54   Lab Results  Component Value Date   NA 139 01/01/2013   K 3.4* 01/01/2013   CREATININE 0.96 01/01/2013   Assessment: Blood pressure control: controlled Progress toward BP goal:  at goal Comments: wide pulse pressure  Plan: Medications:  continue current medications lasix 80mg  bid

## 2013-01-04 NOTE — Patient Instructions (Signed)
Patient instructed to take medications as defined in the Anti-coagulation Track section of this encounter.  Patient instructed to take today's dose.  Patient verbalized understanding of these instructions.    

## 2013-01-04 NOTE — Progress Notes (Signed)
Subjective:   Patient ID: [name] female   DOB: August 17, 1932 79 y.o.   MRN: [id number]  HPI: [name] is a 80 y.o. white female with PMH of HFPEF, CKD stage 3, moderate AS, DM w/ neuropathy, Hypothyroidism, HTN, A fib with chronic A/C, and COPD (on 4L [place] home O2) presenting to [facility] today for hospital follow up visit. She was recently discharged from the hospital for COPD exacerbation on 01/01/13 on prednisone taper. She was to complete a 7 day course of azithromycin and was also discharged on 7 day prednisone taper which is still ongoing. She does not know the specifics of her medications since they are given to her by an aid and the aid says her son puts the medicines she is to take ready for her. She should technically be done with the steroids on 11/14.  Her CBG today is noted to be 452-->488. She has not had lunch and only breakfast and does not have a meter at home to check her blood sugars. She endorses feeling thirsty at this time. She is also feeling hot, but says that is normal for her. Her last A1C was <7 but it it is likely elevated cbg's in setting of steroids. We discussed cbg monitoring and we have prescribed a meter for her to be picked up today and start checking 3 times a day and call the clinic with the results. We will also have her follow up with CDE, and CDE will call in one week to follow up on her blood glucose checks as well.    In regards to her breathing, [name] reports improved breathing since the hospital, less cough, and occasional phlegm that is now yellow in color instead of green. She uses oxygen  with exertion. She does endorse slipping in the bathroom yesterday on her right knee, but was caught by her aid and did not hit her head. She says she is able to walk okay with her walker since fall and only has mild right knee pain.   Past Medical History  Diagnosis Date  . Coronary atherosclerosis of native coronary artery     Mild at cateterization  2006  . Abdominal pain     Likely secondary to presbyesophagus and gastric dysmotility  . Chronic diastolic heart failure     June 2014 echo with EF 55-60% without wall motion abd but mod to severe AS  . Respiratory failure, chronic     Mixed etiology with bronchospastic component  . Urinary incontinence     Recurrent uti's/resistance to cipro, bactrim  . Gout   . Postablative hypothyroidism     H/o Graves disease s/p radioactive iodine ablation with resultant postablative hypothyroidism  . Hyperlipidemia   . Depression   . Morbidly obese     s/p gastric plication surgery  . Gastroesophageal reflux disease   . Ventral hernia     Repair in April 2008, complicated by MRSA abdominal wall cellulitis  . DVT (deep venous thrombosis) 1998  . Pulmonary embolism 1998    [place] filter  . Diabetes mellitus type 2, controlled, with complications   . Essential hypertension, benign   . AF (atrial fibrillation)     on chronic warfarin  . Osteoarthritis   . Stroke 1997    Denies residual  . Anemia     Blood transfusion [V58.2]  . Supratherapeutic INR 05/15/2012    Possibly induced by steroid use.   . Chest pain at rest 08/07/2012  Current Outpatient Prescriptions  Medication Sig Dispense Refill  . acetaminophen (TYLENOL) 500 MG tablet Take 1,000 mg by mouth 2 (two) times daily as needed. For pain.      Marland Kitchen albuterol-ipratropium (COMBIVENT) 18-103 MCG/ACT inhaler Inhale 2 puffs into the lungs every 6 (six) hours as needed for wheezing or shortness of breath.  2 Inhaler  0  . allopurinol (ZYLOPRIM) 100 MG tablet Take 1 tablet (100 mg total) by mouth 2 (two) times daily.  60 tablet  3  . aspirin EC 81 MG EC tablet Take 1 tablet (81 mg total) by mouth daily.      Marland Kitchen atorvastatin (LIPITOR) 40 MG tablet Take 40 mg by mouth daily.      Marland Kitchen azithromycin (ZITHROMAX) 500 MG tablet Take 1 tablet (500 mg total) by mouth daily.  2 tablet  0  . colchicine 0.6 MG tablet Take 0.6 mg by mouth See admin  instructions. Take for 3 days when gout flares up      . FLUoxetine (PROZAC) 20 MG capsule Take 1 capsule (20 mg total) by mouth 2 (two) times daily.  180 capsule  1  . Fluticasone-Salmeterol (ADVAIR) 250-50 MCG/DOSE AEPB Inhale 1 puff into the lungs 2 (two) times daily.      . furosemide (LASIX) 80 MG tablet Take 1 tablet (80 mg total) by mouth 2 (two) times daily.  60 tablet  1  . gabapentin (NEURONTIN) 100 MG capsule Take 200 mg by mouth 2 (two) times daily.      Marland Kitchen glipiZIDE (GLUCOTROL) 10 MG tablet Take 10 mg by mouth 2 (two) times daily.      Marland Kitchen levothyroxine (SYNTHROID) 150 MCG tablet Take 1 tablet (150 mcg total) by mouth daily.  30 tablet  11  . loperamide (IMODIUM) 2 MG capsule Take 1 capsule (2 mg total) by mouth as needed for diarrhea or loose stools (up to total 16mg  daily).  8 capsule  0  . LORazepam (ATIVAN) 1 MG tablet Take 1 tablet (1 mg total) by mouth at bedtime as needed for anxiety (for sleep).  30 tablet  2  . montelukast (SINGULAIR) 10 MG tablet Take 1 tablet (10 mg total) by mouth daily.  30 tablet  2  . omeprazole (PRILOSEC) 20 MG capsule Take 40 mg by mouth daily.      Marland Kitchen omeprazole (PRILOSEC) 20 MG capsule TAKE TWO CAPSULES BY MOUTH ONCE DAILY  60 capsule  2  . polyethylene glycol powder (GLYCOLAX/MIRALAX) powder Take 17 g by mouth daily as needed (for constipation).  527 g  5  . predniSONE (DELTASONE) 20 MG tablet Take 2 tablets (40 mg total) by mouth daily with breakfast. Take 2 tabs (40mg ) for 3 days and then take 1 tab (20mg ) until finished.  10 tablet  0  . tiotropium (SPIRIVA) 18 MCG inhalation capsule Place 18 mcg into inhaler and inhale daily.      Marland Kitchen warfarin (COUMADIN) 5 MG tablet Take 5mg  every day (M,TU,TH,SAT,SUN) EXCEPT take 2.5mg  on Wednesday's  30 tablet  0   No current facility-administered medications for this visit.   Family History  Problem Relation Age of Onset  . Colon cancer Neg Hx    History   Social History  . Marital Status: Widowed     Spouse Name: N/A    Number of Children: N/A  . Years of Education: N/A   Social History Main Topics  . Smoking status: Former Smoker -- 1.00 packs/day for 1.5 years  Types: Cigarettes  . Smokeless tobacco: Never Used  . Alcohol Use: No  . Drug Use: No  . Sexual Activity: None   Other Topics Concern  . None   Social History Narrative   She lives w/her son. Her son offers help and she has an aid who occasionally cooks for her and takes care of some other tasks around the pt's house.   Has medicare. Has son in [place] who is POA   Ambivalent about DNR issues and does not want a STOP sign posted in home   Home Health Aid visits 6 days a week            Review of Systems:  Constitutional:  Denies fever, chills, diaphoresis, appetite change and fatigue.   HEENT:  Denies congestion, sore throat, rhinorrhea, sneezing, mouth sores, trouble swallowing, neck pain   Respiratory:  DOE, productive cough with occasional yellow sputum, on [place] o2,  Cardiovascular:  Denies chest pain, palpitations, and leg swelling.   Gastrointestinal:  Thirsty. Denies nausea, vomiting, abdominal pain, diarrhea, constipation, blood in stool and abdominal distention.   Genitourinary:  Morbid obesity. Denies dysuria, hematuria, flank pain and difficulty urinating.   Musculoskeletal:  Denies myalgias, back pain, joint swelling, arthralgias and gait problem.   Skin:  Denies pallor, rash and wound.   Neurological:  Occasional dizziness.  Denies syncope,numbness and headaches.    Objective:  Physical Exam: Filed Vitals:   01/04/13 1505  BP: 133/49  Pulse: 77  Temp: 98.1 F (36.7 C)  TempSrc: Oral  SpO2: 96%   Vitals reviewed. General: sitting in chair, NAD HEENT: PERRL, EOMI Cardiac: irregular, 3/6 SEM, loudest RUSB Pulm: b/l rhonchi, decreased air movement on expiration, good inspiration Abd: soft, obese, nontender, nondistended, BS present Ext: warm and well perfused, +2DP B/L, -edema, +mild tenderness to  palpation of right knee, no visible ecchymosis, good mobility of all extremities Neuro: alert and oriented X3, cranial nerves II-XII grossly intact, strength and sensation to light touch equal in bilateral upper and lower extremities  Assessment & Plan:  Discussed with Dr. Criselda Peaches Regular cbg checks x2 weeks and call to report values Will need outpatient pfts and sleep study once respiratory status improved and done with medications bmet next visit (left clinic prior to draw today)

## 2013-01-04 NOTE — Assessment & Plan Note (Addendum)
Should have completed 7 days total azithromycin and has 4 more days of prednisone taper. Reports improvement in cough and breathing. Per pulmonary note, recommended for outpatient PFTs and sleep study.  -recommend PFTs and sleep study when acute treatment finished--will defer to pcp -advised on if symptoms worsen including worsening sob, chest pain, cough, and increased sputum or change in production to let us know right away or if severe go to ED -left clinic prior to bmet to be drawn, will need at next visit

## 2013-01-04 NOTE — Assessment & Plan Note (Addendum)
Lab Results  Component Value Date   HGBA1C 6.7 11/10/2012   HGBA1C 6.4 06/26/2012   HGBA1C 7.0 03/31/2012    Assessment: Diabetes control: good control (HgbA1C at goal) Progress toward A1C goal:  at goal Comments: cbg's up to 480s today in clinic. Did not eat lunch. Likely elevated in setting of steroid use. Was requiring sliding scale while in hospital. Does not have meter at home. Needs further diabetic education, weight loss, and diet modifications. Recommend CDE visit asap.   Plan: Medications:  continue current medications Home glucose monitoring: Frequency: 3 times a day Timing: before meals Instruction/counseling given: reminded to bring blood glucose meter & log to each visit, reminded to bring medications to each visit, discussed foot care, discussed the need for weight loss and discussed diet Educational resources provided:   Self management tools provided:   Other plans: will ask CDE to follow up, prescribed meter to be picked up today and 3 times a day cbg checks explained to aid.  Given 5 units novolog today in clinic for hyperglycemia. Counseled on symptoms of hypoglycemia when at home. Aid says she will eat her dinner when she gets home. Advised to call the clinic with values, especially if >200 consistently

## 2013-01-05 ENCOUNTER — Encounter (INDEPENDENT_AMBULATORY_CARE_PROVIDER_SITE_OTHER): Payer: Medicare PPO | Admitting: Ophthalmology

## 2013-01-05 NOTE — Progress Notes (Signed)
Case discussed with Dr. Qureshi soon after the resident saw the patient.  We reviewed the resident's history and exam and pertinent patient test results.  I agree with the assessment, diagnosis, and plan of care documented in the resident's note. 

## 2013-01-07 ENCOUNTER — Other Ambulatory Visit: Payer: Self-pay | Admitting: Internal Medicine

## 2013-01-07 ENCOUNTER — Ambulatory Visit: Payer: Medicare PPO | Admitting: Cardiology

## 2013-01-07 DIAGNOSIS — E119 Type 2 diabetes mellitus without complications: Secondary | ICD-10-CM

## 2013-01-07 MED ORDER — GLUCOSE BLOOD VI STRP: ORAL_STRIP | Status: AC

## 2013-01-07 MED ORDER — ONETOUCH DELICA LANCETS FINE MISC: 1.0000 | Freq: Three times a day (TID) | Status: AC

## 2013-01-07 MED ORDER — ONETOUCH ULTRA MINI W/DEVICE KIT: PACK | Status: DC

## 2013-01-07 NOTE — Telephone Encounter (Signed)
Talked to pt's son - made awared of refills.

## 2013-01-07 NOTE — Telephone Encounter (Signed)
Fax from [facility] asking for different meter and Rx for strips. She'll also need lancets

## 2013-01-08 ENCOUNTER — Ambulatory Visit: Payer: Medicare PPO | Admitting: Cardiology

## 2013-01-08 ENCOUNTER — Other Ambulatory Visit: Payer: Self-pay | Admitting: Internal Medicine

## 2013-01-11 ENCOUNTER — Encounter (INDEPENDENT_AMBULATORY_CARE_PROVIDER_SITE_OTHER): Payer: Medicare PPO | Admitting: Ophthalmology

## 2013-01-11 DIAGNOSIS — H353290 Exudative age-related macular degeneration, unspecified eye, stage unspecified: Secondary | ICD-10-CM

## 2013-01-11 DIAGNOSIS — I10 Essential (primary) hypertension: Secondary | ICD-10-CM

## 2013-01-11 DIAGNOSIS — E11319 Type 2 diabetes mellitus with unspecified diabetic retinopathy without macular edema: Secondary | ICD-10-CM

## 2013-01-11 DIAGNOSIS — E1139 Type 2 diabetes mellitus with other diabetic ophthalmic complication: Secondary | ICD-10-CM

## 2013-01-11 DIAGNOSIS — H35039 Hypertensive retinopathy, unspecified eye: Secondary | ICD-10-CM

## 2013-01-11 DIAGNOSIS — H3530 Unspecified macular degeneration: Secondary | ICD-10-CM

## 2013-01-12 ENCOUNTER — Ambulatory Visit: Payer: Medicare PPO

## 2013-01-16 ENCOUNTER — Other Ambulatory Visit: Payer: Self-pay | Admitting: Internal Medicine

## 2013-01-18 ENCOUNTER — Telehealth: Payer: Self-pay | Admitting: Pharmacist

## 2013-01-18 ENCOUNTER — Encounter: Payer: Self-pay | Admitting: Internal Medicine

## 2013-01-18 ENCOUNTER — Ambulatory Visit (INDEPENDENT_AMBULATORY_CARE_PROVIDER_SITE_OTHER): Payer: Medicare PPO | Admitting: Internal Medicine

## 2013-01-18 ENCOUNTER — Ambulatory Visit (INDEPENDENT_AMBULATORY_CARE_PROVIDER_SITE_OTHER): Payer: Medicare PPO | Admitting: Dietician

## 2013-01-18 VITALS — BP 120/60 | HR 73 | Temp 97.4°F | Wt 265.0 lb

## 2013-01-18 DIAGNOSIS — E1149 Type 2 diabetes mellitus with other diabetic neurological complication: Secondary | ICD-10-CM

## 2013-01-18 DIAGNOSIS — I10 Essential (primary) hypertension: Secondary | ICD-10-CM

## 2013-01-18 DIAGNOSIS — E1142 Type 2 diabetes mellitus with diabetic polyneuropathy: Secondary | ICD-10-CM

## 2013-01-18 DIAGNOSIS — I4891 Unspecified atrial fibrillation: Secondary | ICD-10-CM

## 2013-01-18 DIAGNOSIS — I5032 Chronic diastolic (congestive) heart failure: Secondary | ICD-10-CM

## 2013-01-18 DIAGNOSIS — J449 Chronic obstructive pulmonary disease, unspecified: Secondary | ICD-10-CM

## 2013-01-18 DIAGNOSIS — R300 Dysuria: Secondary | ICD-10-CM

## 2013-01-18 DIAGNOSIS — Z7901 Long term (current) use of anticoagulants: Secondary | ICD-10-CM

## 2013-01-18 DIAGNOSIS — I509 Heart failure, unspecified: Secondary | ICD-10-CM

## 2013-01-18 LAB — BASIC METABOLIC PANEL WITH GFR
CO2: 30 mEq/L (ref 19–32)
Calcium: 9.8 mg/dL (ref 8.4–10.5)
Creat: 1.12 mg/dL — ABNORMAL HIGH (ref 0.50–1.10)
GFR, Est African American: 54 mL/min — ABNORMAL LOW
GFR, Est Non African American: 47 mL/min — ABNORMAL LOW
Glucose, Bld: 156 mg/dL — ABNORMAL HIGH (ref 70–99)
Potassium: 4.7 mEq/L (ref 3.5–5.3)

## 2013-01-18 MED ORDER — WARFARIN SODIUM 5 MG PO TABS: ORAL_TABLET | ORAL | Status: DC

## 2013-01-18 NOTE — Telephone Encounter (Signed)
Being seen today by her PCP. INR was collected during course of other blood work being done. Fingerstick INR = 2.2 on 37.5mg /wk warfarin (5mg  qd except on 1 day of week--takes 1 & 1/2 x 5mg  = 7.5mg  each Monday); advised to CONTINUE this warfarin regimen and RTC on Monday 15-DEC-14 at 2:15PM.

## 2013-01-18 NOTE — Addendum Note (Signed)
Addended by: Windell Hummingbird E on: 01/18/2013 04:05 PM   Modules accepted: Orders

## 2013-01-18 NOTE — Patient Instructions (Signed)
To DECREASE your weight- you need to decrease your portions and calories  Eat hard boiled egg for breakfast, use OLD fashioned Oats instead of instant oatmeal.  Decrease fruit by having MIRALAX in hot water or tea instead of orange juice. Limit to only one fruit for breakfast- either raisins or banana in oatmeal.   Lunch eat 2 times as much vegetables as you eat of starch.   Can add fruit for dessert-can freeze half a banana for a frozen dessert.    If your blood sugars get down to 90-120, you may want to sconsider talking to your doctor about decreasing your diabetes medicine or chanigng it to one that may help you with your weight loss goal.

## 2013-01-18 NOTE — Progress Notes (Signed)
Medical Nutrition Therapy:  Appt start time: 1430 end time:  1500.  Assessment:  Primary concerns today: Weight management and Blood sugar control.  Usual physical activity includes very limited activity. She is walker or wheel chair bound. Sleeps between breakfast and lunch daily. No Meter brought in today,. Aiode reports no problems operating it and that they check blood sugar daily before breakfast and dinner. Blood sugars are ~ 190 fasting and 200s before dinner Usual eating pattern includes 3 meals and 0-1 snacks per day. Frequent foods include potatoes, 2% milk, orange juice, instant oatmeal, eggs.  Avoided foods include soda, sugar, sweets.   24-hr recall: (Up at  AM) B ( AM)-  Instant oatmeal with raisins, cinnamon, banana, orange juice for miralax and a glass of 2% milk. L ( PM)- a starch, a vegetable and protein(meat), water    D ( PM)- a starch, a vegetable and protein(meat), water Snack=- another smaller plate of dinner foods   Progress Towards Goal(s):  In progress.   Nutritional Diagnosis:  NI-5.8.2 Excessive carbohydrate intake As related to large intakes of starchy foods and fruits.  As evidenced by reprot provided by patient and her aide who cooks for her. .    Intervention:  Nutrition education on ways to balance carbohydrates throughout her day to assist with blood sugar control and  decreasing portions to assist with her goal of weight loss. . Coordination of care- suggested follow up if patient is serious about weight loss. May need to work with son, person who does food shopping, and activity  Monitoring/Evaluation:  Dietary intake, exercise, blood sugars, and body weight prn.

## 2013-01-18 NOTE — Progress Notes (Addendum)
Patient ID: [name], female   DOB: 1932/10/08, 79 y.o.   MRN: [id number] HPI The patient is a 79 y.o. female with a history of dCHF, CKD stage 3, moderate AS, DM w/ neuropathy, hypothyroidism, HTN, A fib with chronic A/C, and COPD (on 4L [place] home O2) presenting to [facility] today for 2 week re-check.  Patient was seen for HFU appt on 11/10 after she had been hospitalized 11/3-11/8 for COPD exacerbation. She completed azithromycin 7 day course as well as 7 day prednisone taper (aid and son help her with medication management). As far as her breathing goes, she is doing fairly well. Her breathing is essentially back to baseline today. Denies cough, CP, ST, F/C, nasal congestion or other symptoms of URI. Per note from last visit, PFTs would be beneficial for this patient.   As far as her diabetes, patient's last A1c 6.7% 11/2012. Patient had elevated CBG (452, 488) during her last visit thought be be 2/2 corticosteroids. Her CBGs have been more well controlled at home since she stopped the prednisone, last CBG 137 per verbal report.   Patient is on lasix 80mg  BID and was supposed to get her BMP checked last visit, but did not get that done.   Patient reports she would like to see Dr. Alexandria Lodge today. Dr. Alexandria Lodge agrees.  Patient denies abdominal pain, dysuria, bowel habit changes, changes in appetite.   Patient has home health aid that helps her with her medical issues and medication management.   ROS: General: no fevers, chills, changes in weight, changes in appetite Skin: no rash HEENT: no blurry vision, hearing changes, sore throat Pulm: see HPI CV: no chest pain, palpitations, BLE swelling Abd: no abdominal pain, nausea/vomiting, diarrhea/constipation GU: no dysuria, hematuria, polyuria Ext: no arthralgias, myalgias Neuro: no weakness, numbness, or tingling  Filed Vitals:   01/18/13 1341  BP: 120/60  Pulse: 73  Temp: 97.4 F (36.3 C)  satting 94% on 2LPM O2   PEX General: alert,  cooperative, and in no apparent distress HEENT: pupils equal round and reactive to light, vision grossly intact, oropharynx clear and non-erythematous  Neck: supple, no lymphadenopathy Lungs: clear to ascultation bilaterally, normal work of respiration, no wheezes, rales, ronchi Heart: regular rate and rhythm, no murmurs, gallops, or rubs Abdomen: soft, non-tender, non-distended, normal bowel sounds Extremities: warm extremities bilaterally, no BLE edema  Neurologic: alert & oriented X3, cranial nerves II-XII grossly intact, strength grossly intact, sensation intact to light touch  Current Outpatient Prescriptions on File Prior to Visit  Medication Sig Dispense Refill  . acetaminophen (TYLENOL) 500 MG tablet Take 1,000 mg by mouth 2 (two) times daily as needed. For pain.      Marland Kitchen allopurinol (ZYLOPRIM) 100 MG tablet TAKE ONE TABLET BY MOUTH TWICE DAILY  60 tablet  0  . aspirin EC 81 MG EC tablet Take 1 tablet (81 mg total) by mouth daily.      Marland Kitchen atorvastatin (LIPITOR) 40 MG tablet Take 40 mg by mouth daily.      . Blood Glucose Monitoring Suppl (ONE TOUCH ULTRA MINI) W/DEVICE KIT Check blood sugar 3x a day dx code 250.02  100 each  12  . colchicine 0.6 MG tablet Take 0.6 mg by mouth See admin instructions. Take for 3 days when gout flares up      . COMBIVENT RESPIMAT 20-100 MCG/ACT AERS respimat INHALE TWO PUFFS EVERY 6 HOURS AS NEEDED FOR WHEEZING FOR SHORTNESS OF BREATH  4 g  0  .  FLUoxetine (PROZAC) 20 MG capsule Take 1 capsule (20 mg total) by mouth 2 (two) times daily.  180 capsule  1  . Fluticasone-Salmeterol (ADVAIR) 250-50 MCG/DOSE AEPB Inhale 1 puff into the lungs 2 (two) times daily.      . furosemide (LASIX) 80 MG tablet Take 1 tablet (80 mg total) by mouth 2 (two) times daily.  60 tablet  1  . gabapentin (NEURONTIN) 100 MG capsule TAKE TWO CAPSULES BY MOUTH TWICE DAILY  90 capsule  0  . glipiZIDE (GLUCOTROL) 10 MG tablet Take 10 mg by mouth 2 (two) times daily.      Marland Kitchen glucose blood  (ONE TOUCH ULTRA TEST) test strip Check blood sugar 3x a day dx code 250.00  100 each  12  . levothyroxine (SYNTHROID) 150 MCG tablet Take 1 tablet (150 mcg total) by mouth daily.  30 tablet  11  . loperamide (IMODIUM) 2 MG capsule Take 1 capsule (2 mg total) by mouth as needed for diarrhea or loose stools (up to total 16mg  daily).  8 capsule  0  . LORazepam (ATIVAN) 1 MG tablet Take 1 tablet (1 mg total) by mouth at bedtime as needed for anxiety (for sleep).  30 tablet  2  . montelukast (SINGULAIR) 10 MG tablet Take 1 tablet (10 mg total) by mouth daily.  30 tablet  2  . omeprazole (PRILOSEC) 20 MG capsule TAKE TWO CAPSULES BY MOUTH ONCE DAILY  60 capsule  2  . ONETOUCH DELICA LANCETS FINE MISC 1 each by Does not apply route 3 (three) times daily. Dx code 250.00  100 each  12  . polyethylene glycol powder (GLYCOLAX/MIRALAX) powder Take 17 g by mouth daily as needed (for constipation).  527 g  5  . tiotropium (SPIRIVA) 18 MCG inhalation capsule Place 18 mcg into inhaler and inhale daily.      Marland Kitchen azithromycin (ZITHROMAX) 500 MG tablet Take 1 tablet (500 mg total) by mouth daily.  2 tablet  0  . predniSONE (DELTASONE) 20 MG tablet Take 2 tablets (40 mg total) by mouth daily with breakfast. Take 2 tabs (40mg ) for 3 days and then take 1 tab (20mg ) until finished.  10 tablet  0   No current facility-administered medications on file prior to visit.    Assessment/Plan

## 2013-01-18 NOTE — Assessment & Plan Note (Signed)
Just prior to leaving the clinic patient mentioned to the nurse that "it hurts when I pee." Nurse obtained urine and I added on a UA to her orders at the last minute

## 2013-01-18 NOTE — Patient Instructions (Signed)
Thank you for your visit.  Today we checked your kidney function and electrolytes. We also checked your INR, blood glucose level.   You will see Dr. Alexandria Lodge as well as Norm Parcel today after our appointment.  We sent an order for pulmonary function testing to be done. You will be called with this appointment.  This is to check to see how your lung function is doing.   Please follow up with your PCP in 1 month or sooner if your symptoms worsen and you need medical attention.

## 2013-01-18 NOTE — Assessment & Plan Note (Signed)
DM type 2, well controlled. Last A1c 6.7% 11/2012. CBGs had been elevated 2/2 corticosteroid use. Now off of prednisone and CBGs reportedly improved at home.  -check CBG today -pt will see [name] as she had previously scheduled appointment

## 2013-01-18 NOTE — Assessment & Plan Note (Signed)
Patient's breathing seems to be at baseline. She continues on home O2 at 2LPM. No concern for acute exacerbation at this time. -PFTs ordered today

## 2013-01-18 NOTE — Progress Notes (Signed)
I saw and evaluated the patient.  I personally confirmed the key portions of the history and exam documented by Dr. Chikowski and I reviewed pertinent patient test results.  The assessment, diagnosis, and plan were formulated together and I agree with the documentation in the resident's note. 

## 2013-01-18 NOTE — Assessment & Plan Note (Signed)
Patient will see Dr. Alexandria Lodge today for INR check after our visit.

## 2013-01-18 NOTE — Assessment & Plan Note (Signed)
Patient is on lasix 80mg  BID. She was supposed to get BMP during her last visit earlier this month, but this was not done. We will check BMP today.

## 2013-01-19 LAB — URINALYSIS, MICROSCOPIC ONLY: Squamous Epithelial / LPF: NONE SEEN

## 2013-01-19 LAB — URINALYSIS, ROUTINE W REFLEX MICROSCOPIC
Bilirubin Urine: NEGATIVE
Glucose, UA: NEGATIVE mg/dL
Nitrite: NEGATIVE
Protein, ur: NEGATIVE mg/dL
Urobilinogen, UA: 0.2 mg/dL (ref 0.0–1.0)
pH: 7.5 (ref 5.0–8.0)

## 2013-01-20 ENCOUNTER — Telehealth: Payer: Self-pay | Admitting: *Deleted

## 2013-01-20 ENCOUNTER — Telehealth: Payer: Self-pay | Admitting: Internal Medicine

## 2013-01-20 LAB — GLUCOSE, CAPILLARY: Glucose-Capillary: 163 mg/dL — ABNORMAL HIGH (ref 70–99)

## 2013-01-20 NOTE — Telephone Encounter (Signed)
I spoke with [name] today and informed her of her lab results. Patient continues to have some burning with urination, but no other symptoms of UTI. Her UA showed 21-50 WBC/hpf with neg bacteria, nitrites, protein, blood. Patient has h/o of being treated multiple times for UTI. She also endorses that sometimes she has burning with urination that resolves spontaneously. I am unsure if the current symptoms represent a UTI. I asked patient to return to clinic next week if her symptoms do not resolve on their own. She agrees with this plan.

## 2013-01-20 NOTE — Telephone Encounter (Signed)
Called pt about her appt on 12/8 for PFT's; stated the doctor had already called her. I will also mailed the appt to her. Stated she's having pain, stated she felled; only taking Tylenol. Informed her the clinic will be closed Thurs/Fri but if the pain continues, she needs to go to the ED. She agreed.

## 2013-01-20 NOTE — Telephone Encounter (Signed)
Message copied by Hassan Buckler on Wed Jan 20, 2013  2:52 PM ------      Message from: Windell Hummingbird E      Created: Wed Jan 20, 2013  1:23 PM      Regarding: FW: PFT Order       Hi Omarie Parcell,            I attempted to call this patient twice today with no answer. I was hoping you would be able to follow up on this for me since I start nights next week?             Thank you!            Fleet Contras       ----- Message -----         From: Donell Sievert         Sent: 01/20/2013   7:50 AM           To: Windell Hummingbird, MD      Subject: PFT Order                                                PFT for Cathlean Marseilles has been scheduled at [facility] for December 8th at 9:00am with 8:45am arrival time.  Please instruct patient to enter hospital on [address] through main entrance A and to register with admitting.  If this appointment day/time does not work for patient, please call [phone] to reschedule.            Thank you.             ------

## 2013-01-28 NOTE — Addendum Note (Signed)
Addended by: Bufford Spikes on: 01/28/2013 04:28 PM   Modules accepted: Orders

## 2013-01-30 ENCOUNTER — Other Ambulatory Visit: Payer: Self-pay | Admitting: Internal Medicine

## 2013-02-01 ENCOUNTER — Ambulatory Visit (HOSPITAL_COMMUNITY)
Admission: RE | Admit: 2013-02-01 | Discharge: 2013-02-01 | Disposition: A | Discharge status: Home / Self Care | Payer: Medicare PPO | Source: Ambulatory Visit | Attending: Internal Medicine | Admitting: Internal Medicine

## 2013-02-01 DIAGNOSIS — J449 Chronic obstructive pulmonary disease, unspecified: Secondary | ICD-10-CM

## 2013-02-01 DIAGNOSIS — J4489 Other specified chronic obstructive pulmonary disease: Secondary | ICD-10-CM

## 2013-02-01 LAB — PULMONARY FUNCTION TEST
FEF2575-%Change-Post: -31 %
FEF2575-%Pred-Pre: 152 %
FEV1-%Pred-Post: 52 %
FEV1FVC-%Change-Post: -4 %
FEV1FVC-%Pred-Pre: 132 %
FEV6-%Pred-Post: 43 %
FEV6-%Pred-Pre: 48 %
FEV6-Pre: 0.99 L
FEV6FVC-%Pred-Pre: 106 %
FVC-%Change-Post: -9 %
FVC-%Pred-Post: 41 %
FVC-%Pred-Pre: 45 %
FVC-Post: 0.9 L
FVC-Pre: 0.99 L
Post FEV1/FVC ratio: 94 %
Pre FEV6/FVC Ratio: 100 %

## 2013-02-01 MED ORDER — ALBUTEROL SULFATE (5 MG/ML) 0.5% IN NEBU: 2.5000 mg | INHALATION_SOLUTION | Freq: Once | RESPIRATORY_TRACT | Status: DC

## 2013-02-05 ENCOUNTER — Inpatient Hospital Stay (HOSPITAL_COMMUNITY)
Admission: EM | Admit: 2013-02-05 | Discharge: 2013-02-06 | DRG: 292 | Disposition: A | Discharge status: Home / Self Care | Payer: Medicare PPO | Attending: Internal Medicine | Admitting: Internal Medicine

## 2013-02-05 ENCOUNTER — Encounter (HOSPITAL_COMMUNITY): Payer: Self-pay | Admitting: Emergency Medicine

## 2013-02-05 ENCOUNTER — Emergency Department (HOSPITAL_COMMUNITY): Payer: Medicare PPO

## 2013-02-05 DIAGNOSIS — Z9089 Acquired absence of other organs: Secondary | ICD-10-CM

## 2013-02-05 DIAGNOSIS — D649 Anemia, unspecified: Secondary | ICD-10-CM | POA: Diagnosis present

## 2013-02-05 DIAGNOSIS — Z7901 Long term (current) use of anticoagulants: Secondary | ICD-10-CM

## 2013-02-05 DIAGNOSIS — I4891 Unspecified atrial fibrillation: Secondary | ICD-10-CM | POA: Diagnosis present

## 2013-02-05 DIAGNOSIS — R001 Bradycardia, unspecified: Secondary | ICD-10-CM | POA: Diagnosis present

## 2013-02-05 DIAGNOSIS — E785 Hyperlipidemia, unspecified: Secondary | ICD-10-CM | POA: Diagnosis present

## 2013-02-05 DIAGNOSIS — E039 Hypothyroidism, unspecified: Secondary | ICD-10-CM | POA: Diagnosis present

## 2013-02-05 DIAGNOSIS — Z6841 Body Mass Index (BMI) 40.0 and over, adult: Secondary | ICD-10-CM

## 2013-02-05 DIAGNOSIS — Z9884 Bariatric surgery status: Secondary | ICD-10-CM

## 2013-02-05 DIAGNOSIS — Z9981 Dependence on supplemental oxygen: Secondary | ICD-10-CM

## 2013-02-05 DIAGNOSIS — F3289 Other specified depressive episodes: Secondary | ICD-10-CM | POA: Diagnosis present

## 2013-02-05 DIAGNOSIS — I350 Nonrheumatic aortic (valve) stenosis: Secondary | ICD-10-CM | POA: Diagnosis present

## 2013-02-05 DIAGNOSIS — I129 Hypertensive chronic kidney disease with stage 1 through stage 4 chronic kidney disease, or unspecified chronic kidney disease: Secondary | ICD-10-CM | POA: Diagnosis present

## 2013-02-05 DIAGNOSIS — Z86718 Personal history of other venous thrombosis and embolism: Secondary | ICD-10-CM

## 2013-02-05 DIAGNOSIS — E890 Postprocedural hypothyroidism: Secondary | ICD-10-CM | POA: Diagnosis present

## 2013-02-05 DIAGNOSIS — Z87891 Personal history of nicotine dependence: Secondary | ICD-10-CM

## 2013-02-05 DIAGNOSIS — K219 Gastro-esophageal reflux disease without esophagitis: Secondary | ICD-10-CM | POA: Diagnosis present

## 2013-02-05 DIAGNOSIS — I509 Heart failure, unspecified: Secondary | ICD-10-CM | POA: Diagnosis present

## 2013-02-05 DIAGNOSIS — N183 Chronic kidney disease, stage 3 unspecified: Secondary | ICD-10-CM | POA: Diagnosis present

## 2013-02-05 DIAGNOSIS — E119 Type 2 diabetes mellitus without complications: Secondary | ICD-10-CM | POA: Diagnosis present

## 2013-02-05 DIAGNOSIS — I5033 Acute on chronic diastolic (congestive) heart failure: Principal | ICD-10-CM | POA: Diagnosis present

## 2013-02-05 DIAGNOSIS — I359 Nonrheumatic aortic valve disorder, unspecified: Secondary | ICD-10-CM | POA: Diagnosis present

## 2013-02-05 DIAGNOSIS — N12 Tubulo-interstitial nephritis, not specified as acute or chronic: Secondary | ICD-10-CM | POA: Diagnosis present

## 2013-02-05 DIAGNOSIS — I10 Essential (primary) hypertension: Secondary | ICD-10-CM | POA: Diagnosis present

## 2013-02-05 DIAGNOSIS — I2510 Atherosclerotic heart disease of native coronary artery without angina pectoris: Secondary | ICD-10-CM | POA: Diagnosis present

## 2013-02-05 DIAGNOSIS — J441 Chronic obstructive pulmonary disease with (acute) exacerbation: Secondary | ICD-10-CM | POA: Diagnosis present

## 2013-02-05 DIAGNOSIS — M109 Gout, unspecified: Secondary | ICD-10-CM | POA: Diagnosis present

## 2013-02-05 DIAGNOSIS — F329 Major depressive disorder, single episode, unspecified: Secondary | ICD-10-CM | POA: Diagnosis present

## 2013-02-05 DIAGNOSIS — Z86711 Personal history of pulmonary embolism: Secondary | ICD-10-CM

## 2013-02-05 DIAGNOSIS — Z8673 Personal history of transient ischemic attack (TIA), and cerebral infarction without residual deficits: Secondary | ICD-10-CM

## 2013-02-05 DIAGNOSIS — I495 Sick sinus syndrome: Secondary | ICD-10-CM | POA: Diagnosis present

## 2013-02-05 DIAGNOSIS — J449 Chronic obstructive pulmonary disease, unspecified: Secondary | ICD-10-CM | POA: Diagnosis present

## 2013-02-05 DIAGNOSIS — N390 Urinary tract infection, site not specified: Secondary | ICD-10-CM

## 2013-02-05 DIAGNOSIS — R32 Unspecified urinary incontinence: Secondary | ICD-10-CM

## 2013-02-05 DIAGNOSIS — J9610 Chronic respiratory failure, unspecified whether with hypoxia or hypercapnia: Secondary | ICD-10-CM

## 2013-02-05 DIAGNOSIS — J4489 Other specified chronic obstructive pulmonary disease: Secondary | ICD-10-CM | POA: Diagnosis present

## 2013-02-05 HISTORY — DX: Shortness of breath: R06.02

## 2013-02-05 HISTORY — DX: Chronic obstructive pulmonary disease, unspecified: J44.9

## 2013-02-05 LAB — CBC WITH DIFFERENTIAL/PLATELET
Basophils Relative: 0 % (ref 0–1)
Eosinophils Absolute: 0.2 10*3/uL (ref 0.0–0.7)
Eosinophils Relative: 2 % (ref 0–5)
HCT: 33.7 % — ABNORMAL LOW (ref 36.0–46.0)
Hemoglobin: 9.8 g/dL — ABNORMAL LOW (ref 12.0–15.0)
Lymphocytes Relative: 25 % (ref 12–46)
MCH: 23.5 pg — ABNORMAL LOW (ref 26.0–34.0)
MCHC: 29.1 g/dL — ABNORMAL LOW (ref 30.0–36.0)
MCV: 80.8 fL (ref 78.0–100.0)
Monocytes Absolute: 0.5 10*3/uL (ref 0.1–1.0)
Monocytes Relative: 7 % (ref 3–12)
Neutrophils Relative %: 65 % (ref 43–77)
Platelets: 247 10*3/uL (ref 150–400)

## 2013-02-05 LAB — URINE MICROSCOPIC-ADD ON

## 2013-02-05 LAB — URINALYSIS, ROUTINE W REFLEX MICROSCOPIC
Hgb urine dipstick: NEGATIVE
Protein, ur: NEGATIVE mg/dL
Specific Gravity, Urine: 1.013 (ref 1.005–1.030)
Urobilinogen, UA: 0.2 mg/dL (ref 0.0–1.0)
pH: 6.5 (ref 5.0–8.0)

## 2013-02-05 LAB — COMPREHENSIVE METABOLIC PANEL
ALT: 14 U/L (ref 0–35)
Albumin: 3.3 g/dL — ABNORMAL LOW (ref 3.5–5.2)
BUN: 34 mg/dL — ABNORMAL HIGH (ref 6–23)
Calcium: 9.5 mg/dL (ref 8.4–10.5)
Chloride: 97 mEq/L (ref 96–112)
Creatinine, Ser: 1.24 mg/dL — ABNORMAL HIGH (ref 0.50–1.10)
Total Bilirubin: 0.3 mg/dL (ref 0.3–1.2)
Total Protein: 6.8 g/dL (ref 6.0–8.3)

## 2013-02-05 LAB — PROTIME-INR: Prothrombin Time: 25.2 seconds — ABNORMAL HIGH (ref 11.6–15.2)

## 2013-02-05 LAB — APTT: aPTT: 40 seconds — ABNORMAL HIGH (ref 24–37)

## 2013-02-05 LAB — TROPONIN I: Troponin I: <0.3 ng/mL (ref <0.30)

## 2013-02-05 MED ORDER — WARFARIN SODIUM 5 MG PO TABS
5.0000 mg | ORAL_TABLET | ORAL | Status: DC
  Administered 2013-02-05: 18:00:00 5 mg via ORAL
  Filled 2013-02-05 (×2): qty 1

## 2013-02-05 MED ORDER — METHYLPREDNISOLONE SODIUM SUCC 125 MG IJ SOLR
125.0000 mg | Freq: Once | INTRAMUSCULAR | Status: AC
  Administered 2013-02-05: 125 mg via INTRAVENOUS

## 2013-02-05 MED ORDER — ALBUTEROL SULFATE (5 MG/ML) 0.5% IN NEBU: 2.5000 mg | INHALATION_SOLUTION | RESPIRATORY_TRACT | Status: DC | PRN

## 2013-02-05 MED ORDER — PANTOPRAZOLE SODIUM 40 MG PO TBEC
40.0000 mg | DELAYED_RELEASE_TABLET | Freq: Every day | ORAL | Status: DC
  Administered 2013-02-05 – 2013-02-06 (×2): 40 mg via ORAL
  Filled 2013-02-05 (×2): qty 1

## 2013-02-05 MED ORDER — LORAZEPAM 1 MG PO TABS: 1.0000 mg | ORAL_TABLET | Freq: Every evening | ORAL | Status: DC | PRN

## 2013-02-05 MED ORDER — COLCHICINE 0.6 MG PO TABS: 0.6000 mg | ORAL_TABLET | ORAL | Status: DC

## 2013-02-05 MED ORDER — INSULIN ASPART 100 UNIT/ML ~~LOC~~ SOLN
0.0000 [IU] | SUBCUTANEOUS | Status: DC
  Administered 2013-02-05: 21:00:00 3 [IU] via SUBCUTANEOUS
  Administered 2013-02-05 (×2): 7 [IU] via SUBCUTANEOUS
  Administered 2013-02-06 (×2): 2 [IU] via SUBCUTANEOUS

## 2013-02-05 MED ORDER — TIOTROPIUM BROMIDE MONOHYDRATE 18 MCG IN CAPS
18.0000 ug | ORAL_CAPSULE | Freq: Every day | RESPIRATORY_TRACT | Status: DC
  Administered 2013-02-05 – 2013-02-06 (×2): 18 ug via RESPIRATORY_TRACT
  Filled 2013-02-05: qty 5

## 2013-02-05 MED ORDER — WARFARIN - PHARMACIST DOSING INPATIENT: Freq: Every day | Status: DC

## 2013-02-05 MED ORDER — HEPARIN SODIUM (PORCINE) 5000 UNIT/ML IJ SOLN: 5000.0000 [IU] | Freq: Three times a day (TID) | INTRAMUSCULAR | Status: DC

## 2013-02-05 MED ORDER — FUROSEMIDE 10 MG/ML IJ SOLN
80.0000 mg | Freq: Two times a day (BID) | INTRAMUSCULAR | Status: DC
  Administered 2013-02-05 – 2013-02-06 (×3): 80 mg via INTRAVENOUS
  Filled 2013-02-05 (×4): qty 8

## 2013-02-05 MED ORDER — MONTELUKAST SODIUM 10 MG PO TABS
10.0000 mg | ORAL_TABLET | Freq: Every day | ORAL | Status: DC
  Administered 2013-02-05: 09:00:00 10 mg via ORAL
  Filled 2013-02-05: qty 1

## 2013-02-05 MED ORDER — GUAIFENESIN ER 600 MG PO TB12
600.0000 mg | ORAL_TABLET | Freq: Two times a day (BID) | ORAL | Status: DC
  Administered 2013-02-05 – 2013-02-06 (×3): 600 mg via ORAL
  Filled 2013-02-05 (×4): qty 1

## 2013-02-05 MED ORDER — SODIUM CHLORIDE 0.9 % IJ SOLN
3.0000 mL | Freq: Two times a day (BID) | INTRAMUSCULAR | Status: DC
  Administered 2013-02-05 – 2013-02-06 (×2): 3 mL via INTRAVENOUS

## 2013-02-05 MED ORDER — SODIUM CHLORIDE 0.9 % IJ SOLN: 3.0000 mL | INTRAMUSCULAR | Status: DC | PRN

## 2013-02-05 MED ORDER — ALLOPURINOL 100 MG PO TABS
100.0000 mg | ORAL_TABLET | Freq: Two times a day (BID) | ORAL | Status: DC
  Administered 2013-02-05 – 2013-02-06 (×3): 100 mg via ORAL
  Filled 2013-02-05 (×4): qty 1

## 2013-02-05 MED ORDER — LOPERAMIDE HCL 2 MG PO CAPS: 2.0000 mg | ORAL_CAPSULE | ORAL | Status: DC | PRN

## 2013-02-05 MED ORDER — POLYETHYLENE GLYCOL 3350 17 GM/SCOOP PO POWD
17.0000 g | Freq: Every day | ORAL | Status: DC | PRN
  Filled 2013-02-05: qty 255

## 2013-02-05 MED ORDER — WARFARIN SODIUM 2.5 MG PO TABS: 2.5000 mg | ORAL_TABLET | ORAL | Status: DC

## 2013-02-05 MED ORDER — MOMETASONE FURO-FORMOTEROL FUM 100-5 MCG/ACT IN AERO
2.0000 | INHALATION_SPRAY | Freq: Two times a day (BID) | RESPIRATORY_TRACT | Status: DC
  Administered 2013-02-05 – 2013-02-06 (×3): 2 via RESPIRATORY_TRACT
  Filled 2013-02-05: qty 8.8

## 2013-02-05 MED ORDER — FLUOXETINE HCL 20 MG PO CAPS
20.0000 mg | ORAL_CAPSULE | Freq: Two times a day (BID) | ORAL | Status: DC
  Administered 2013-02-05 – 2013-02-06 (×3): 20 mg via ORAL
  Filled 2013-02-05 (×4): qty 1

## 2013-02-05 MED ORDER — ACETAMINOPHEN 500 MG PO TABS: 1000.0000 mg | ORAL_TABLET | Freq: Two times a day (BID) | ORAL | Status: DC | PRN

## 2013-02-05 MED ORDER — GABAPENTIN 100 MG PO CAPS
200.0000 mg | ORAL_CAPSULE | Freq: Two times a day (BID) | ORAL | Status: DC
  Administered 2013-02-05 – 2013-02-06 (×3): 200 mg via ORAL
  Filled 2013-02-05 (×4): qty 2

## 2013-02-05 MED ORDER — SODIUM CHLORIDE 0.9 % IV SOLN: 250.0000 mL | INTRAVENOUS | Status: DC | PRN

## 2013-02-05 MED ORDER — CIPROFLOXACIN HCL 500 MG PO TABS
500.0000 mg | ORAL_TABLET | Freq: Two times a day (BID) | ORAL | Status: DC
  Administered 2013-02-05: 500 mg via ORAL
  Filled 2013-02-05 (×4): qty 1

## 2013-02-05 MED ORDER — ATORVASTATIN CALCIUM 40 MG PO TABS
40.0000 mg | ORAL_TABLET | Freq: Every day | ORAL | Status: DC
  Administered 2013-02-05 – 2013-02-06 (×2): 40 mg via ORAL
  Filled 2013-02-05 (×2): qty 1

## 2013-02-05 MED ORDER — LEVOTHYROXINE SODIUM 150 MCG PO TABS
150.0000 ug | ORAL_TABLET | Freq: Every day | ORAL | Status: DC
  Administered 2013-02-05 – 2013-02-06 (×2): 150 ug via ORAL
  Filled 2013-02-05 (×3): qty 1

## 2013-02-05 MED ORDER — CIPROFLOXACIN HCL 500 MG PO TABS: 500.0000 mg | ORAL_TABLET | Freq: Two times a day (BID) | ORAL | Status: DC

## 2013-02-05 MED ORDER — ALBUTEROL (5 MG/ML) CONTINUOUS INHALATION SOLN
10.0000 mg/h | INHALATION_SOLUTION | RESPIRATORY_TRACT | Status: DC
  Administered 2013-02-05: 10 mg/h via RESPIRATORY_TRACT
  Filled 2013-02-05: qty 20

## 2013-02-05 MED ORDER — ASPIRIN EC 81 MG PO TBEC
81.0000 mg | DELAYED_RELEASE_TABLET | Freq: Every day | ORAL | Status: DC
  Administered 2013-02-05 – 2013-02-06 (×2): 81 mg via ORAL
  Filled 2013-02-05 (×2): qty 1

## 2013-02-05 MED ORDER — METHYLPREDNISOLONE SODIUM SUCC 125 MG IJ SOLR
125.0000 mg | Freq: Once | INTRAMUSCULAR | Status: DC
  Filled 2013-02-05: qty 2

## 2013-02-05 MED ORDER — SODIUM CHLORIDE 0.9 % IJ SOLN
3.0000 mL | Freq: Two times a day (BID) | INTRAMUSCULAR | Status: DC
  Administered 2013-02-05 (×2): 3 mL via INTRAVENOUS

## 2013-02-05 MED ORDER — CIPROFLOXACIN HCL 500 MG PO TABS
500.0000 mg | ORAL_TABLET | Freq: Two times a day (BID) | ORAL | Status: DC
  Administered 2013-02-05 – 2013-02-06 (×2): 500 mg via ORAL
  Filled 2013-02-05 (×4): qty 1

## 2013-02-05 NOTE — Progress Notes (Signed)
ANTICOAGULATION CONSULT NOTE - Initial Consult  Pharmacy Consult for Coumadin Indication: atrial fibrillation  Allergies  Allergen Reactions  . Lantus [Insulin Glargine]     Causes a lot of itching  . Nitrofurantoin [Macrodantin] Itching    Patient Measurements: Height: 5\' 3"  (160 cm) Weight: 270 lb 4.5 oz (122.6 kg) IBW/kg (Calculated) : 52.4  Vital Signs: Temp: 97 F (36.1 C) (12/12 0727) Temp src: Oral (12/12 0727) BP: 127/98 mmHg (12/12 0727) Pulse Rate: 69 (12/12 0727)  Labs:  Recent Labs  02/05/13 0200 02/05/13 0202  HGB  --  9.8*  HCT  --  33.7*  PLT  --  247  APTT  --  40*  LABPROT  --  25.2*  INR  --  2.38*  CREATININE  --  1.24*  TROPONINI <0.30  --     Estimated Creatinine Clearance: 47.5 ml/min (by C-G formula based on Cr of 1.24).   Medical History: Past Medical History  Diagnosis Date  . Coronary atherosclerosis of native coronary artery     Mild at cateterization 2006  . Abdominal pain     Likely secondary to presbyesophagus and gastric dysmotility  . Chronic diastolic heart failure     June 2014 echo with EF 55-60% without wall motion abd but mod to severe AS  . Respiratory failure, chronic     Mixed etiology with bronchospastic component  . Urinary incontinence     Recurrent uti's/resistance to cipro, bactrim  . Gout   . Postablative hypothyroidism     H/o Graves disease s/p radioactive iodine ablation with resultant postablative hypothyroidism  . Hyperlipidemia   . Depression   . Morbidly obese     s/p gastric plication surgery  . Gastroesophageal reflux disease   . Ventral hernia     Repair in April 2008, complicated by MRSA abdominal wall cellulitis  . DVT (deep venous thrombosis) 1998  . Pulmonary embolism 1998    Greenfield filter  . Diabetes mellitus type 2, controlled, with complications   . Essential hypertension, benign   . AF (atrial fibrillation)     on chronic warfarin  . Osteoarthritis   . Stroke 1997    Denies  residual  . Anemia     Blood transfusion [V58.2]  . Supratherapeutic INR 05/15/2012    Possibly induced by steroid use.   . Chest pain at rest 08/07/2012    Medications:  Prescriptions prior to admission  Medication Sig Dispense Refill  . acetaminophen (TYLENOL) 500 MG tablet Take 1,000 mg by mouth 2 (two) times daily as needed. For pain.      Marland Kitchen allopurinol (ZYLOPRIM) 100 MG tablet TAKE ONE TABLET BY MOUTH TWICE DAILY  60 tablet  2  . aspirin EC 81 MG EC tablet Take 1 tablet (81 mg total) by mouth daily.      Marland Kitchen atorvastatin (LIPITOR) 40 MG tablet Take 40 mg by mouth daily.      . Blood Glucose Monitoring Suppl (ONE TOUCH ULTRA MINI) W/DEVICE KIT Check blood sugar 3x a day dx code 250.02  100 each  12  . colchicine 0.6 MG tablet Take 0.6 mg by mouth See admin instructions. Take for 3 days when gout flares up      . COMBIVENT RESPIMAT 20-100 MCG/ACT AERS respimat INHALE TWO PUFFS EVERY 6 HOURS AS NEEDED FOR WHEEZING FOR SHORTNESS OF BREATH  4 g  0  . FLUoxetine (PROZAC) 20 MG capsule Take 1 capsule (20 mg total) by mouth 2 (two) times  daily.  180 capsule  1  . Fluticasone-Salmeterol (ADVAIR) 250-50 MCG/DOSE AEPB Inhale 1 puff into the lungs 2 (two) times daily.      . furosemide (LASIX) 80 MG tablet Take 1 tablet (80 mg total) by mouth 2 (two) times daily.  60 tablet  1  . gabapentin (NEURONTIN) 100 MG capsule TAKE TWO CAPSULES BY MOUTH TWICE DAILY  90 capsule  0  . glipiZIDE (GLUCOTROL) 10 MG tablet Take 10 mg by mouth 2 (two) times daily.      Marland Kitchen glucose blood (ONE TOUCH ULTRA TEST) test strip Check blood sugar 3x a day dx code 250.00  100 each  12  . levothyroxine (SYNTHROID) 150 MCG tablet Take 1 tablet (150 mcg total) by mouth daily.  30 tablet  11  . loperamide (IMODIUM) 2 MG capsule Take 1 capsule (2 mg total) by mouth as needed for diarrhea or loose stools (up to total 16mg  daily).  8 capsule  0  . LORazepam (ATIVAN) 1 MG tablet Take 1 tablet (1 mg total) by mouth at bedtime as needed  for anxiety (for sleep).  30 tablet  2  . montelukast (SINGULAIR) 10 MG tablet Take 1 tablet (10 mg total) by mouth daily.  30 tablet  2  . omeprazole (PRILOSEC) 20 MG capsule TAKE TWO CAPSULES BY MOUTH ONCE DAILY  60 capsule  2  . polyethylene glycol powder (GLYCOLAX/MIRALAX) powder Take 17 g by mouth daily as needed (for constipation).  527 g  5  . tiotropium (SPIRIVA) 18 MCG inhalation capsule Place 18 mcg into inhaler and inhale daily.      Marland Kitchen warfarin (COUMADIN) 5 MG tablet Take 5mg  every day (M,TU,TH,SAT,SUN) EXCEPT take 2.5mg  on Wednesday's  30 tablet  0  . ONETOUCH DELICA LANCETS FINE MISC 1 each by Does not apply route 3 (three) times daily. Dx code 250.00  100 each  12    Assessment: 79 yo female admitted with CHF exacerbation, h/o Afib, to continue Coumadin  Goal of Therapy:  INR 2-3 Monitor platelets by anticoagulation protocol: Yes   Plan:  Continue home regimen Daily INR  [name], Gary Fleet 02/05/2013,7:28 AM

## 2013-02-05 NOTE — ED Provider Notes (Signed)
CSN: [id number]     Arrival date & time 02/05/13  0137 History   First MD Initiated Contact with Patient 02/05/13 0138     Chief Complaint  Patient presents with  . Shortness of Breath   (Consider location/radiation/quality/duration/timing/severity/associated sxs/prior Treatment) HPI Patient presents with progressive shortness of breath for the last 2-3 days. She is on 2 L of home oxygen. She states she's had increased wheezing has been using her nebulizer at home. She's also been having shortness of breath with any exertion and with lying flat. She admits to bilateral lower extremity swelling but is essentially unchanged from previous. She's had mild cough with minimal sputum production. She's had no fevers or chills. She complains of some chest pain when she coughs but otherwise she has no pain in her chest. Patient does have a history of COPD, CHF and prior PE. Past Medical History  Diagnosis Date  . Coronary atherosclerosis of native coronary artery     Mild at cateterization 2006  . Abdominal pain     Likely secondary to presbyesophagus and gastric dysmotility  . Chronic diastolic heart failure     June 2014 echo with EF 55-60% without wall motion abd but mod to severe AS  . Respiratory failure, chronic     Mixed etiology with bronchospastic component  . Urinary incontinence     Recurrent uti's/resistance to cipro, bactrim  . Gout   . Postablative hypothyroidism     H/o Graves disease s/p radioactive iodine ablation with resultant postablative hypothyroidism  . Hyperlipidemia   . Depression   . Morbidly obese     s/p gastric plication surgery  . Gastroesophageal reflux disease   . Ventral hernia     Repair in April 2008, complicated by MRSA abdominal wall cellulitis  . DVT (deep venous thrombosis) 1998  . Pulmonary embolism 1998    Greenfield filter  . Diabetes mellitus type 2, controlled, with complications   . Essential hypertension, benign   . AF (atrial fibrillation)      on chronic warfarin  . Osteoarthritis   . Stroke 1997    Denies residual  . Anemia     Blood transfusion [V58.2]  . Supratherapeutic INR 05/15/2012    Possibly induced by steroid use.   . Chest pain at rest 08/07/2012   Past Surgical History  Procedure Laterality Date  . Cholecystectomy    . Appendectomy    . Breast biopsy    . Gastric bypass  1970's  . Hernia repair  05/2006    ventral hernia  . Greenfield filter placement  1998   Family History  Problem Relation Age of Onset  . Colon cancer Neg Hx    History  Substance Use Topics  . Smoking status: Former Smoker -- 1.00 packs/day for 1.5 years    Types: Cigarettes  . Smokeless tobacco: Never Used  . Alcohol Use: No   OB History   Grav Para Term Preterm Abortions TAB SAB Ect Mult Living                 Review of Systems  Constitutional: Negative for fever and chills.  Respiratory: Positive for cough, shortness of breath and wheezing.   Cardiovascular: Positive for chest pain and leg swelling. Negative for palpitations.  Gastrointestinal: Negative for nausea, vomiting, abdominal pain, diarrhea and constipation.  Genitourinary: Negative for dysuria and flank pain.  Musculoskeletal: Negative for back pain, myalgias, neck pain and neck stiffness.  Skin: Negative for rash and wound.  Neurological: Negative for dizziness, syncope, weakness, light-headedness, numbness and headaches.  All other systems reviewed and are negative.    Allergies  Lantus and Nitrofurantoin  Home Medications   Current Outpatient Rx  Name  Route  Sig  Dispense  Refill  . acetaminophen (TYLENOL) 500 MG tablet   Oral   Take 1,000 mg by mouth 2 (two) times daily as needed. For pain.         Marland Kitchen allopurinol (ZYLOPRIM) 100 MG tablet      TAKE ONE TABLET BY MOUTH TWICE DAILY   60 tablet   2   . aspirin EC 81 MG EC tablet   Oral   Take 1 tablet (81 mg total) by mouth daily.         Marland Kitchen atorvastatin (LIPITOR) 40 MG tablet   Oral    Take 40 mg by mouth daily.         Marland Kitchen azithromycin (ZITHROMAX) 500 MG tablet   Oral   Take 1 tablet (500 mg total) by mouth daily.   2 tablet   0   . Blood Glucose Monitoring Suppl (ONE TOUCH ULTRA MINI) W/DEVICE KIT      Check blood sugar 3x a day dx code 250.02   100 each   12   . colchicine 0.6 MG tablet   Oral   Take 0.6 mg by mouth See admin instructions. Take for 3 days when gout flares up         . COMBIVENT RESPIMAT 20-100 MCG/ACT AERS respimat      INHALE TWO PUFFS EVERY 6 HOURS AS NEEDED FOR WHEEZING FOR SHORTNESS OF BREATH   4 g   0   . FLUoxetine (PROZAC) 20 MG capsule   Oral   Take 1 capsule (20 mg total) by mouth 2 (two) times daily.   180 capsule   1   . Fluticasone-Salmeterol (ADVAIR) 250-50 MCG/DOSE AEPB   Inhalation   Inhale 1 puff into the lungs 2 (two) times daily.         . furosemide (LASIX) 80 MG tablet   Oral   Take 1 tablet (80 mg total) by mouth 2 (two) times daily.   60 tablet   1   . gabapentin (NEURONTIN) 100 MG capsule      TAKE TWO CAPSULES BY MOUTH TWICE DAILY   90 capsule   0   . glipiZIDE (GLUCOTROL) 10 MG tablet   Oral   Take 10 mg by mouth 2 (two) times daily.         Marland Kitchen glucose blood (ONE TOUCH ULTRA TEST) test strip      Check blood sugar 3x a day dx code 250.00   100 each   12   . levothyroxine (SYNTHROID) 150 MCG tablet   Oral   Take 1 tablet (150 mcg total) by mouth daily.   30 tablet   11   . loperamide (IMODIUM) 2 MG capsule   Oral   Take 1 capsule (2 mg total) by mouth as needed for diarrhea or loose stools (up to total 16mg  daily).   8 capsule   0   . LORazepam (ATIVAN) 1 MG tablet   Oral   Take 1 tablet (1 mg total) by mouth at bedtime as needed for anxiety (for sleep).   30 tablet   2   . montelukast (SINGULAIR) 10 MG tablet   Oral   Take 1 tablet (10 mg total) by mouth daily.   30 tablet  2   . omeprazole (PRILOSEC) 20 MG capsule      TAKE TWO CAPSULES BY MOUTH ONCE DAILY   60  capsule   2   . ONETOUCH DELICA LANCETS FINE MISC   Does not apply   1 each by Does not apply route 3 (three) times daily. Dx code 250.00   100 each   12   . polyethylene glycol powder (GLYCOLAX/MIRALAX) powder   Oral   Take 17 g by mouth daily as needed (for constipation).   527 g   5   . predniSONE (DELTASONE) 20 MG tablet   Oral   Take 2 tablets (40 mg total) by mouth daily with breakfast. Take 2 tabs (40mg ) for 3 days and then take 1 tab (20mg ) until finished.   10 tablet   0   . tiotropium (SPIRIVA) 18 MCG inhalation capsule   Inhalation   Place 18 mcg into inhaler and inhale daily.         Marland Kitchen warfarin (COUMADIN) 5 MG tablet      Take 5mg  every day (M,TU,TH,SAT,SUN) EXCEPT take 2.5mg  on Wednesday's   30 tablet   0    BP 158/53  Pulse 49  Temp(Src) 98.1 F (36.7 C) (Oral)  Resp 17  Ht 5\' 3"  (1.6 m)  Wt 277 lb (125.646 kg)  BMI 49.08 kg/m2  SpO2 97%  LMP 05/22/1968 Physical Exam  Nursing note and vitals reviewed. Constitutional: She is oriented to person, place, and time. She appears well-developed and well-nourished. No distress.  Chronically ill appearing  HENT:  Head: Normocephalic and atraumatic.  Mouth/Throat: Oropharynx is clear and moist. No oropharyngeal exudate.  Eyes: EOM are normal. Pupils are equal, round, and reactive to light.  Neck: Normal range of motion. Neck supple.  Cardiovascular: Normal rate and regular rhythm.  Exam reveals no gallop and no friction rub.   No murmur heard. Pulmonary/Chest: Effort normal. No respiratory distress. She has wheezes. She has no rales.  Expiratory wheezes throughout.  Abdominal: Soft. Bowel sounds are normal. She exhibits no distension and no mass. There is no tenderness. There is no rebound and no guarding.  Musculoskeletal: Normal range of motion. She exhibits no edema and no tenderness.  Mild bilateral lower extremity swelling without definite pitting. No calf tenderness.  Neurological: She is alert and  oriented to person, place, and time.  Moves all extremities without deficit. Sensation grossly intact.  Skin: Skin is warm and dry. No rash noted. No erythema.  Psychiatric: She has a normal mood and affect. Her behavior is normal.    ED Course  Procedures (including critical care time) Labs Review Labs Reviewed  CBC WITH DIFFERENTIAL  COMPREHENSIVE METABOLIC PANEL  TROPONIN I  PROTIME-INR  URINALYSIS, ROUTINE W REFLEX MICROSCOPIC  APTT   Imaging Review No results found.  EKG Interpretation   None       MDM    We'll treat symptomatically and reevaluate.  Loren Racer, MD 02/10/13 [phone]

## 2013-02-05 NOTE — ED Notes (Signed)
Ambulated patient in hall with walker and O2. Pt. O2 sats dropped down to 90%. Pt. States she felt shaky/uneasy and weak. MD notified.

## 2013-02-05 NOTE — H&P (Signed)
Internal Medicine Attending Admission Note Date: 02/05/2013  Patient name: [name] Medical record number: [id number] Date of birth: [birth year] Age: 79 y.o. Gender: female  I saw and evaluated the patient. I reviewed the resident's note and I agree with the resident's findings and plan as documented in the resident's note.  Briefly, [name] is a 79 year old woman with a past medical history of diastolic heart failure, chronic obstructive pulmonary disease requiring home oxygen therapy, stage III chronic kidney disease, diabetes, hypertension, hyperlipidemia, aortic stenosis, and atrial fibrillation on chronic Coumadin who presents with a 2 day history of shortness of breath and paroxysmal nocturnal dyspnea. Interestingly, she denies any orthopnea despite the paroxysmal nocturnal dyspnea. She also denies any chest pain, fevers, shakes, or chills. She has bilateral lower extremity edema which is unchanged. She notes that she's been compliant with both her cardiac and pulmonary medications . Her only other complaint is dysuria with urinary incontinence and frequency over the last 2-3 weeks  On examination during rounds this morning she was lying flat in bed in no acute distress. She did not have jugular venous distention or inspiratory crackles or wheezes. Her cardiac examination was remarkable for a 3/6 systolic murmur with a muffled S2 and a brief early diastolic murmur consistent with moderate aortic stenosis with regurgitation. She did have trace pitting edema in her bilateral lower extremities. Her weight was up 15 pounds from her reported dry weight of 262 pounds and her BNP was also over 4000 which is nearly 2000 greater than during the last admission. Finally her chest x-ray had some mild interstitial markings that could be interpreted as early edema. She was therefore diagnosed with acute on chronic diastolic heart failure.  The etiology of the decompensation is unclear but may be  related to both medication compliance, dietary compliance, and her urinary tract infection. We will diurese with intravenous Lasix being mindful of her aortic stenosis. We will also work on lowering her afterload. Her symptomatic UTI will be treated with antibiotics.  Finally, we will clean up her pulmonary regimen by discontinuing the Singulair as this is ineffective for chronic obstructive pulmonary disease, changing the Combivent to albuterol as needed as she does not need dual anticholinergic therapy and continuing with the Advair and Spiriva. I anticipate she'll be in the hospital greater than 48 hours to accomplish this diuresis and improve her symptomatology.

## 2013-02-05 NOTE — H&P (Signed)
Date: 02/05/2013               Patient Name:  [name] MRN: [id number]  DOB: 1933/02/19 Age / Sex: 79 y.o., female   PCP: Aletta Edouard, MD         Medical Service: Internal Medicine Teaching Service         Attending physician: Dr. Josem Kaufmann  Internal Medicine Teaching Service Contact Information  Weekday Hours (7AM-5PM):   1st Contact: Dr. Lewie Chamber        Pager:[phone] 2nd Contact: Dr. Leonia Reeves       Pager; [phone]  ** If no return call within 15 minutes (after trying both pagers listed above), please call after hours pagers.   After 5 pm or weekends: 1st Contact: Pager: [phone] 2nd Contact: Pager: [phone]  Chief Complaint: cough and increased SOB  History of Present Illness:  [name] is a 79yo WF w/ PMH dCHF, CKD III, COPD on home O2, HTN, HLD, DM (last A1c 6.7% 10/2012), GERD, prior PE, aortic stenosis, A fib on chronic coumadin, postablative hypothyroidism who presents w/ c/o nonproductive cough and worsening SOB x 2 days. Patient reports that she has DOE, but is able to lay flat and only uses one pillow at night. She does however endorse waking up in the middle of the night gasping for air recently. She has some chest pain only with coughing recently, no other CP. She has also developed ST, nasal congestion, rhinorrhea, decreased appetite over the last few days. Denies F/C, abd pain, N/V/D. She has BLE edema, but this is at baseline today. Pt has been taking her combivent inhaler twice daily, this is not more than she usually uses it. She also reports compiance with advair, spiriva, singulair, and lasix 80mg  BID. She has h/o urinary incontinence and frequent complaints of dysuria and has been treated for UTI many times in the past, last treated Sept 2014 w/ cipro. Today she c/o both increased urinary "dribbling" and dysuria x 2 weeks, denies hematuria.   In the ED, pt was afebrile, BP 135/55, HR 61, RR 16, satting 95% on 2LPM. CMP revealed chronically  elevated bicarb 35, BUN 34, Cr 1.24 (baseline 1-1.1). CBC- Hb 9.8 (baseline). INR 2.38. Pro BNP 4599 (last admission 7922). Trop neg x 1. EKG was not obtained. CXR showed vascular congestion and mild cardiomegaly with increased interstitial markings. Patient received solumedrol 125mg  x 1 dose and 1 albuterol neb in the ED.   Meds: Current Facility-Administered Medications  Medication Dose Route Frequency Provider Last Rate Last Dose  . albuterol (PROVENTIL,VENTOLIN) solution continuous neb  10 mg/hr Nebulization Continuous Loren Racer, MD   10 mg/hr at 02/05/13 0202   Current Outpatient Prescriptions  Medication Sig Dispense Refill  . acetaminophen (TYLENOL) 500 MG tablet Take 1,000 mg by mouth 2 (two) times daily as needed. For pain.      Marland Kitchen allopurinol (ZYLOPRIM) 100 MG tablet TAKE ONE TABLET BY MOUTH TWICE DAILY  60 tablet  2  . aspirin EC 81 MG EC tablet Take 1 tablet (81 mg total) by mouth daily.      Marland Kitchen atorvastatin (LIPITOR) 40 MG tablet Take 40 mg by mouth daily.      . Blood Glucose Monitoring Suppl (ONE TOUCH ULTRA MINI) W/DEVICE KIT Check blood sugar 3x a day dx code 250.02  100 each  12  . colchicine 0.6 MG tablet Take 0.6 mg by mouth See admin instructions. Take for 3 days when gout flares up      .  COMBIVENT RESPIMAT 20-100 MCG/ACT AERS respimat INHALE TWO PUFFS EVERY 6 HOURS AS NEEDED FOR WHEEZING FOR SHORTNESS OF BREATH  4 g  0  . FLUoxetine (PROZAC) 20 MG capsule Take 1 capsule (20 mg total) by mouth 2 (two) times daily.  180 capsule  1  . Fluticasone-Salmeterol (ADVAIR) 250-50 MCG/DOSE AEPB Inhale 1 puff into the lungs 2 (two) times daily.      . furosemide (LASIX) 80 MG tablet Take 1 tablet (80 mg total) by mouth 2 (two) times daily.  60 tablet  1  . gabapentin (NEURONTIN) 100 MG capsule TAKE TWO CAPSULES BY MOUTH TWICE DAILY  90 capsule  0  . glipiZIDE (GLUCOTROL) 10 MG tablet Take 10 mg by mouth 2 (two) times daily.      Marland Kitchen glucose blood (ONE TOUCH ULTRA TEST) test strip  Check blood sugar 3x a day dx code 250.00  100 each  12  . levothyroxine (SYNTHROID) 150 MCG tablet Take 1 tablet (150 mcg total) by mouth daily.  30 tablet  11  . loperamide (IMODIUM) 2 MG capsule Take 1 capsule (2 mg total) by mouth as needed for diarrhea or loose stools (up to total 16mg  daily).  8 capsule  0  . LORazepam (ATIVAN) 1 MG tablet Take 1 tablet (1 mg total) by mouth at bedtime as needed for anxiety (for sleep).  30 tablet  2  . montelukast (SINGULAIR) 10 MG tablet Take 1 tablet (10 mg total) by mouth daily.  30 tablet  2  . omeprazole (PRILOSEC) 20 MG capsule TAKE TWO CAPSULES BY MOUTH ONCE DAILY  60 capsule  2  . polyethylene glycol powder (GLYCOLAX/MIRALAX) powder Take 17 g by mouth daily as needed (for constipation).  527 g  5  . tiotropium (SPIRIVA) 18 MCG inhalation capsule Place 18 mcg into inhaler and inhale daily.      Marland Kitchen warfarin (COUMADIN) 5 MG tablet Take 5mg  every day (M,TU,TH,SAT,SUN) EXCEPT take 2.5mg  on Wednesday's  30 tablet  0  . ONETOUCH DELICA LANCETS FINE MISC 1 each by Does not apply route 3 (three) times daily. Dx code 250.00  100 each  12   Allergies: Allergies as of 02/05/2013 - Review Complete 02/05/2013  Allergen Reaction Noted  . Lantus [insulin glargine]  05/13/2012  . Nitrofurantoin [macrodantin] Itching 04/20/2010   Past Medical History  Diagnosis Date  . Coronary atherosclerosis of native coronary artery     Mild at cateterization 2006  . Abdominal pain     Likely secondary to presbyesophagus and gastric dysmotility  . Chronic diastolic heart failure     June 2014 echo with EF 55-60% without wall motion abd but mod to severe AS  . Respiratory failure, chronic     Mixed etiology with bronchospastic component  . Urinary incontinence     Recurrent uti's/resistance to cipro, bactrim  . Gout   . Postablative hypothyroidism     H/o Graves disease s/p radioactive iodine ablation with resultant postablative hypothyroidism  . Hyperlipidemia   .  Depression   . Morbidly obese     s/p gastric plication surgery  . Gastroesophageal reflux disease   . Ventral hernia     Repair in April 2008, complicated by MRSA abdominal wall cellulitis  . DVT (deep venous thrombosis) 1998  . Pulmonary embolism 1998    Greenfield filter  . Diabetes mellitus type 2, controlled, with complications   . Essential hypertension, benign   . AF (atrial fibrillation)     on chronic  warfarin  . Osteoarthritis   . Stroke 1997    Denies residual  . Anemia     Blood transfusion [V58.2]  . Supratherapeutic INR 05/15/2012    Possibly induced by steroid use.   . Chest pain at rest 08/07/2012   Past Surgical History  Procedure Laterality Date  . Cholecystectomy    . Appendectomy    . Breast biopsy    . Gastric bypass  1970's  . Hernia repair  05/2006    ventral hernia  . Greenfield filter placement  1998   Family History  Problem Relation Age of Onset  . Colon cancer Neg Hx    History   Social History  . Marital Status: Widowed    Spouse Name: N/A    Number of Children: N/A  . Years of Education: N/A   Occupational History  . Not on file.   Social History Main Topics  . Smoking status: Former Smoker -- 1.00 packs/day for 50 years    Types: Cigarettes  . Smokeless tobacco: Never Used  . Alcohol Use: No  . Drug Use: No  . Sexual Activity: Not on file   Other Topics Concern  . Not on file   Social History Narrative   She lives w/her son. Her son offers help and she has an aid who occasionally cooks for her and takes care of some other tasks around the pt's house.   Has medicare. Has son in [place] who is POA   Ambivalent about DNR issues and does not want a STOP sign posted in home   Home Health Aid visits 6 days a week   Uses walker at home.           Review of Systems: General: +decreased appetite; no fevers, chills, changes in weight Skin: no rash HEENT: +nasal congestion and ST; no blurry vision, hearing changes Pulm: see HPI CV:  see HPI Abd: no abdominal pain, nausea/vomiting, diarrhea/constipation GU: see HPI Ext: no arthralgias, myalgias Neuro: no weakness, numbness, or tingling  Physical Exam: Blood pressure 148/79, pulse 40, temperature 98.1 F (36.7 C), temperature source Oral, resp. rate 20, height 5\' 3"  (1.6 m), weight 125.646 kg (277 lb) (dry weight 262lbs), SpO2 97.00%. on 2LPM O2 [place]  General: alert, cooperative, and in mild respiratory distress, speaking in 4 word sentences  HEENT: pupils equal round and reactive to light, vision grossly intact, oropharynx clear and non-erythematous, dry mucous membranes Neck: supple, no JVD  Lungs: some increased WOB; poor air movement bilaterally, no wheezes or crackles Heart: regular rate and rhythm, 3/6 systolic murmur; no gallops, or rubs Abdomen: soft, non-tender, non-distended, normal bowel sounds Extremities: warm extremities bilaterally; trace pitting edema to BLE  Neurologic: alert & oriented X3, cranial nerves II-XII grossly intact, strength grossly intact, sensation intact to light touch  Lab results: Basic Metabolic Panel:  Recent Labs  16/10/96 0202  NA 138  K 4.1  CL 97  CO2 35*  GLUCOSE 166*  BUN 34*  CREATININE 1.24*  CALCIUM 9.5   Liver Function Tests:  Recent Labs  02/05/13 0202  AST 17  ALT 14  ALKPHOS 116  BILITOT 0.3  PROT 6.8  ALBUMIN 3.3*   CBC:  Recent Labs  02/05/13 0202  WBC 7.2  NEUTROABS 4.7  HGB 9.8*  HCT 33.7*  MCV 80.8  PLT 247   Cardiac Enzymes:  Recent Labs  02/05/13 0200  TROPONINI <0.30   BNP:  Recent Labs  02/05/13 0202  PROBNP 4599.0*  Coagulation:  Recent Labs  02/05/13 0202  LABPROT 25.2*  INR 2.38*    Imaging results:  Dg Chest Port 1 View  02/05/2013   CLINICAL DATA:  Shortness of breath; recent fall.  Mid back pain.  EXAM: PORTABLE CHEST - 1 VIEW  COMPARISON:  Chest radiograph performed 01/01/2013  FINDINGS: The lungs are mildly hypoexpanded. Vascular congestion is noted,  with increased interstitial markings, raising concern for mild pulmonary edema. There is stable elevation of the right hemidiaphragm. No pleural effusion or pneumothorax is seen.  The cardiomediastinal silhouette is borderline enlarged. No acute osseous abnormalities are seen.  IMPRESSION: Vascular congestion and borderline cardiomegaly, with increased interstitial markings, raising concern for mild pulmonary edema. No displaced rib fracture seen.   Electronically Signed   By: Roanna Raider M.D.   On: 02/05/2013 02:32    Other results: EKG: not done in ED  Assessment & Plan by Problem:  #CHF (preserved EF) exacerbation: Patient's shortness of breath is most likely caused by CHF exacerbation as patient has elevated proBNP, pulmonary edema on chest x-ray, plus leg edema on physical exam and an increased body weight from 262lbs on 12/28/12 to 277lbs today. 2-D echo on 09/05/12 showed EF 55-60% with moderate aortic stenosis (aortic valve area is 0.74 cm2). Of note, no Echo in EPIC from 2003-2014 comment on diastolic dysfunction, though this is on her problem list. Pt is less likely to have ACS given no chest pain, but can be atypical presentation. Pt has h/o hypothyroidism on synthroid, most recent TSH 4.1 on 06/26/12. Signs & symptoms not typical of PNA or COPD exacerbation given no productive cough and no wheezing/rhonchi on auscultation. Unlikely PE given therapeutic anticoagulation.  - Admit to tele bed for observation - Lasix 80mg  IV BID (2.0 x home dose)- monitor closely given preload dependence due to AS   - EKG i - Trop x 1 - Heart health diet - daily weights    #COPD: Presumably at baseline. On 2L home O2. Clinical presentation not typical of COPD exacerbation.   -Continue O2   -albuterol nebulizer prn   -Substitute Dulera for Advair (formulary) -continue home spirava inhaler and singulair    #CKD, stage III: Stable (GFR 46). Baseline creatinine is about 1.0. Creatinine slightly elevated 1.24  on admission. - Lasix as above  - follow renal function carefully, if renal function worsens, will discontinue allopurinol   #Atrial Fibrillation: H/o sick sinus syndrome with recurrent bradycardia and intolerance to beta blockers. Asymptomatic now and rate is regular. On chronic anticoagulation with therapeutic INR.   -Coumadin per pharmacy   #Normocytic Anemia: Baseline Hb seems to be around  10 to 11.  Hb 9.8 at admission.  - follow up CBC  #HTN: Stable.  BP is 135/55 on admission.  -Lasix as above   #Hypothyroidism: TSH 4.144 (wnl) on 06/26/12.   -Continue home Levothyroxine 150 mcg daily   #DM type 2, controlled: A1c 6.7 on 11/10/12. At home on glipizide 5mg  dialy. The patient is allergic to Lantus. -Hold home glipizide   -SSI-sensitive  -Continue Gabapentin   #Depression: Stable.  -Continue Prozac   #VTE ppx: Anticoagulation (coumadin)   #Code Status: Full code  Dispo: Disposition is deferred at this time, awaiting improvement of current medical problems. Anticipated discharge in approximately 1 day(s).   The patient does have a current PCP (Aletta Edouard, MD) and does need an [facility] hospital follow-up appointment after discharge.  The patient does have transportation limitations that hinder transportation to clinic appointments.  Signed: Windell Hummingbird, MD 02/05/2013, 5:24 AM

## 2013-02-05 NOTE — Consult Note (Signed)
PHARMACY CONSULT NOTE - INITIAL  Pharmacy Consult for :   Cipro Indication:  Presumed Pyleno with history of Proteus UTI [facility] 2014]  Hospital Problems: Principal Problem:   CHF exacerbation Active Problems:   HYPOTHYROIDISM   HYPERLIPIDEMIA   HYPERTENSION   CAD (coronary artery disease)   Bradycardia   Aortic stenosis   DM (diabetes mellitus), type 2   Allergies: Allergies  Allergen Reactions  . Lantus [Insulin Glargine]     Causes a lot of itching  . Nitrofurantoin [Macrodantin] Itching    Patient Measurements: Height: 5\' 3"  (160 cm) Weight: 270 lb 4.5 oz (122.6 kg) IBW/kg (Calculated) : 52.4  Vital Signs: BP 125/66  Pulse 61  Temp(Src) 98.7 F (37.1 C) (Oral)  Resp 18  Ht 5\' 3"  (1.6 m)  Wt 270 lb 4.5 oz (122.6 kg)  BMI 47.89 kg/m2  SpO2 95%  LMP 05/22/1968  Labs:  Recent Labs  02/05/13 0202  WBC 7.2  HGB 9.8*  PLT 247  CREATININE 1.24*   Estimated Creatinine Clearance: 47.5 ml/min (by C-G formula based on Cr of 1.24).   Microbiology: No results  Medications:  Scheduled:  . allopurinol  100 mg Oral BID  . aspirin EC  81 mg Oral Daily  . atorvastatin  40 mg Oral Daily  . ciprofloxacin  500 mg Oral Q12H  . FLUoxetine  20 mg Oral BID  . furosemide  80 mg Intravenous BID  . gabapentin  200 mg Oral BID  . guaiFENesin  600 mg Oral BID  . insulin aspart  0-9 Units Subcutaneous Q4H  . levothyroxine  150 mcg Oral QAC breakfast  . mometasone-formoterol  2 puff Inhalation BID  . pantoprazole  40 mg Oral Daily  . sodium chloride  3 mL Intravenous Q12H  . sodium chloride  3 mL Intravenous Q12H  . tiotropium  18 mcg Inhalation Daily  . warfarin  5 mg Oral Custom   And  . [START ON 02/10/2013] warfarin  2.5 mg Oral Q Wed-1800  . Warfarin - Pharmacist Dosing Inpatient   Does not apply q1800    Assessment:  79 y/o female on chronic Coumadin for atrial fibrillation who has developed pyelonephritis.  She has a history of Proteus Miriabilis UTI in  July of 2014 sensitive to CTX, Cipro/Levaquin, Gent/Tobra, Zosyn, and Bactrim.    She will be placed on Cipro and INR's followed closely due to the interaction of Cipro and Coumadin.  Fosfomycin is not a good choice in pyelonephritis due to poor tissue levels.  Renal function noted with estimated CrCl 48 ml/min.  Goal of Therapy:   Resolution of infection Antibiotics selected for infection/cultures and adjusted for renal function.   Plan:   Begin Cipro 500 mg po BID  Follow up with next outpatient INR Monday. Follow up SCr, UOP, cultures, clinical course and adjust as clinically indicated.  Garrick Midgley, Elisha Headland,  Pharm.D.,    12/12/20145:09 PM

## 2013-02-05 NOTE — Progress Notes (Signed)
UR completed.  Patient changed to inpatient r/t requiring scheduled IV lasix

## 2013-02-05 NOTE — ED Notes (Addendum)
Per EMS patient has felt progressively sob last few days, hx of CHF and today felt like she should come in. On 2l at home,

## 2013-02-06 DIAGNOSIS — N12 Tubulo-interstitial nephritis, not specified as acute or chronic: Secondary | ICD-10-CM

## 2013-02-06 DIAGNOSIS — I129 Hypertensive chronic kidney disease with stage 1 through stage 4 chronic kidney disease, or unspecified chronic kidney disease: Secondary | ICD-10-CM

## 2013-02-06 LAB — BASIC METABOLIC PANEL
BUN: 38 mg/dL — ABNORMAL HIGH (ref 6–23)
CO2: 33 mEq/L — ABNORMAL HIGH (ref 19–32)
Calcium: 9.6 mg/dL (ref 8.4–10.5)
Chloride: 97 mEq/L (ref 96–112)
Chloride: 94 mEq/L — ABNORMAL LOW (ref 96–112)
Creatinine, Ser: 1.08 mg/dL (ref 0.50–1.10)
GFR calc Af Amer: 55 mL/min — ABNORMAL LOW (ref >90)
GFR calc Af Amer: 52 mL/min — ABNORMAL LOW (ref >90)
GFR calc non Af Amer: 48 mL/min — ABNORMAL LOW (ref >90)
Glucose, Bld: 230 mg/dL — ABNORMAL HIGH (ref 70–99)
Potassium: 4 mEq/L (ref 3.5–5.1)
Sodium: 138 mEq/L (ref 135–145)

## 2013-02-06 LAB — GLUCOSE, CAPILLARY: Glucose-Capillary: 171 mg/dL — ABNORMAL HIGH (ref 70–99)

## 2013-02-06 MED ORDER — ALBUTEROL SULFATE HFA 108 (90 BASE) MCG/ACT IN AERS: 2.0000 | INHALATION_SPRAY | Freq: Four times a day (QID) | RESPIRATORY_TRACT | Status: AC | PRN

## 2013-02-06 MED ORDER — INSULIN ASPART 100 UNIT/ML ~~LOC~~ SOLN: 0.0000 [IU] | Freq: Three times a day (TID) | SUBCUTANEOUS | Status: DC

## 2013-02-06 MED ORDER — FUROSEMIDE 80 MG PO TABS
80.0000 mg | ORAL_TABLET | Freq: Two times a day (BID) | ORAL | Status: DC
  Filled 2013-02-06 (×2): qty 1

## 2013-02-06 MED ORDER — CIPROFLOXACIN HCL 500 MG PO TABS: 500.0000 mg | ORAL_TABLET | Freq: Two times a day (BID) | ORAL | Status: DC

## 2013-02-06 NOTE — Progress Notes (Signed)
Subjective: Patient resting in bed in no distress with no complaints. Notes continued dry cough and urinary incontinence but denies dysuria since yesterday as well as abdominal pain, CP, SOB, fevers, or chills.  Objective: Vital signs in last 24 hours: Filed Vitals:   02/05/13 1443 02/05/13 2041 02/05/13 2130 02/06/13 0622  BP: 125/66 155/63  137/54  Pulse: 61 60  61  Temp: 98.7 F (37.1 C) 97.8 F (36.6 C)  98 F (36.7 C)  TempSrc: Oral Oral  Oral  Resp: 18 18  18   Height:      Weight:    121.564 kg (268 lb)  SpO2: 95% 93% 94% 95%   Weight change: -3.046 kg (-6 lb 11.5 oz)  Intake/Output Summary (Last 24 hours) at 02/06/13 0853 Last data filed at 02/06/13 0500  Gross per 24 hour  Intake   1038 ml  Output      0 ml  Net   1038 ml   BP 137/54  Pulse 61  Temp(Src) 98 F (36.7 C) (Oral)  Resp 18  Ht 5\' 3"  (1.6 m)  Wt 121.564 kg (268 lb)  BMI 47.49 kg/m2  SpO2 95%  LMP 05/22/1968 General appearance: alert, cooperative and no distress Head: Normocephalic, without obvious abnormality, atraumatic Back: symmetric, no curvature. ROM normal. No CVA tenderness., TTP over lateral ribs with ?CVA tenderness on and off Lungs: diminished breath sounds bibasilar Heart: regular rate and rhythm, S1, S2 normal and systolic murmur: holosystolic 3/6, crescendo and decrescendo at 2nd left intercostal space, at 2nd right intercostal space Abdomen: soft, non-tender; bowel sounds normal; no masses,  no organomegaly Extremities: edema trace pitting edema B/L LE Skin: senile purpura Lab Results: Basic Metabolic Panel:  Recent Labs Lab 02/05/13 0202  NA 138  K 4.1  CL 97  CO2 35*  GLUCOSE 166*  BUN 34*  CREATININE 1.24*  CALCIUM 9.5   Liver Function Tests:  Recent Labs Lab 02/05/13 0202  AST 17  ALT 14  ALKPHOS 116  BILITOT 0.3  PROT 6.8  ALBUMIN 3.3*   No results found for this basename: LIPASE, AMYLASE,  in the last 168 hours No results found for this basename:  AMMONIA,  in the last 168 hours CBC:  Recent Labs Lab 02/05/13 0202  WBC 7.2  NEUTROABS 4.7  HGB 9.8*  HCT 33.7*  MCV 80.8  PLT 247   Cardiac Enzymes:  Recent Labs Lab 02/05/13 0200 02/05/13 0855  TROPONINI <0.30 <0.30   BNP:  Recent Labs Lab 02/05/13 0202  PROBNP 4599.0*   D-Dimer: No results found for this basename: DDIMER,  in the last 168 hours CBG:  Recent Labs Lab 02/05/13 1114 02/05/13 1606 02/05/13 2029 02/06/13 0001 02/06/13 0415  GLUCAP 344* 326* 239* 171* 154*   Hemoglobin A1C: No results found for this basename: HGBA1C,  in the last 168 hours Fasting Lipid Panel: No results found for this basename: CHOL, HDL, LDLCALC, TRIG, CHOLHDL, LDLDIRECT,  in the last 168 hours Thyroid Function Tests: No results found for this basename: TSH, T4TOTAL, FREET4, T3FREE, THYROIDAB,  in the last 168 hours Coagulation:  Recent Labs Lab 02/05/13 0202 02/06/13 0353  LABPROT 25.2* 25.3*  INR 2.38* 2.39*   Anemia Panel: No results found for this basename: VITAMINB12, FOLATE, FERRITIN, TIBC, IRON, RETICCTPCT,  in the last 168 hours Urine Drug Screen: Drugs of Abuse  No results found for this basename: labopia, cocainscrnur, labbenz, amphetmu, thcu, labbarb    Alcohol Level: No results found for this basename: ETH,  in the last 168 hours Urinalysis:  Recent Labs Lab 02/05/13 0503  COLORURINE YELLOW  LABSPEC 1.013  PHURINE 6.5  GLUCOSEU NEGATIVE  HGBUR NEGATIVE  BILIRUBINUR NEGATIVE  KETONESUR NEGATIVE  PROTEINUR NEGATIVE  UROBILINOGEN 0.2  NITRITE NEGATIVE  LEUKOCYTESUR MODERATE*    Micro Results: Recent Results (from the past 240 hour(s))  URINE CULTURE     Status: None   Collection Time    02/05/13  5:03 AM      Result Value Range Status   Specimen Description URINE, CLEAN CATCH   Final   Special Requests NONE   Final   Culture  Setup Time     Final   Value: 02/05/2013 08:57     Performed at [facility] Count      Final   Value: >=100,000 COLONIES/ML     Performed at [facility]   Culture     Final   Value: ENTEROCOCCUS SPECIES     Performed at [facility]   Report Status PENDING   Incomplete   Studies/Results: Dg Chest Port 1 View  02/05/2013   CLINICAL DATA:  Shortness of breath; recent fall.  Mid back pain.  EXAM: PORTABLE CHEST - 1 VIEW  COMPARISON:  Chest radiograph performed 01/01/2013  FINDINGS: The lungs are mildly hypoexpanded. Vascular congestion is noted, with increased interstitial markings, raising concern for mild pulmonary edema. There is stable elevation of the right hemidiaphragm. No pleural effusion or pneumothorax is seen.  The cardiomediastinal silhouette is borderline enlarged. No acute osseous abnormalities are seen.  IMPRESSION: Vascular congestion and borderline cardiomegaly, with increased interstitial markings, raising concern for mild pulmonary edema. No displaced rib fracture seen.   Electronically Signed   By: Roanna Raider M.D.   On: 02/05/2013 02:32   Medications:  I have reviewed the patient's current medications. Prior to Admission:  Prescriptions prior to admission  Medication Sig Dispense Refill  . acetaminophen (TYLENOL) 500 MG tablet Take 1,000 mg by mouth 2 (two) times daily as needed. For pain.      Marland Kitchen allopurinol (ZYLOPRIM) 100 MG tablet TAKE ONE TABLET BY MOUTH TWICE DAILY  60 tablet  2  . aspirin EC 81 MG EC tablet Take 1 tablet (81 mg total) by mouth daily.      Marland Kitchen atorvastatin (LIPITOR) 40 MG tablet Take 40 mg by mouth daily.      . Blood Glucose Monitoring Suppl (ONE TOUCH ULTRA MINI) W/DEVICE KIT Check blood sugar 3x a day dx code 250.02  100 each  12  . colchicine 0.6 MG tablet Take 0.6 mg by mouth See admin instructions. Take for 3 days when gout flares up      . COMBIVENT RESPIMAT 20-100 MCG/ACT AERS respimat INHALE TWO PUFFS EVERY 6 HOURS AS NEEDED FOR WHEEZING FOR SHORTNESS OF BREATH  4 g  0  . FLUoxetine (PROZAC) 20 MG capsule Take 1  capsule (20 mg total) by mouth 2 (two) times daily.  180 capsule  1  . Fluticasone-Salmeterol (ADVAIR) 250-50 MCG/DOSE AEPB Inhale 1 puff into the lungs 2 (two) times daily.      . furosemide (LASIX) 80 MG tablet Take 1 tablet (80 mg total) by mouth 2 (two) times daily.  60 tablet  1  . gabapentin (NEURONTIN) 100 MG capsule TAKE TWO CAPSULES BY MOUTH TWICE DAILY  90 capsule  0  . glipiZIDE (GLUCOTROL) 10 MG tablet Take 10 mg by mouth 2 (two) times daily.      Marland Kitchen  glucose blood (ONE TOUCH ULTRA TEST) test strip Check blood sugar 3x a day dx code 250.00  100 each  12  . levothyroxine (SYNTHROID) 150 MCG tablet Take 1 tablet (150 mcg total) by mouth daily.  30 tablet  11  . loperamide (IMODIUM) 2 MG capsule Take 1 capsule (2 mg total) by mouth as needed for diarrhea or loose stools (up to total 16mg  daily).  8 capsule  0  . LORazepam (ATIVAN) 1 MG tablet Take 1 tablet (1 mg total) by mouth at bedtime as needed for anxiety (for sleep).  30 tablet  2  . montelukast (SINGULAIR) 10 MG tablet Take 1 tablet (10 mg total) by mouth daily.  30 tablet  2  . omeprazole (PRILOSEC) 20 MG capsule TAKE TWO CAPSULES BY MOUTH ONCE DAILY  60 capsule  2  . polyethylene glycol powder (GLYCOLAX/MIRALAX) powder Take 17 g by mouth daily as needed (for constipation).  527 g  5  . tiotropium (SPIRIVA) 18 MCG inhalation capsule Place 18 mcg into inhaler and inhale daily.      Marland Kitchen warfarin (COUMADIN) 5 MG tablet Take 5mg  every day (M,TU,TH,SAT,SUN) EXCEPT take 2.5mg  on Wednesday's  30 tablet  0  . ONETOUCH DELICA LANCETS FINE MISC 1 each by Does not apply route 3 (three) times daily. Dx code 250.00  100 each  12   Scheduled: . allopurinol  100 mg Oral BID  . aspirin EC  81 mg Oral Daily  . atorvastatin  40 mg Oral Daily  . ciprofloxacin  500 mg Oral Q12H  . FLUoxetine  20 mg Oral BID  . furosemide  80 mg Intravenous BID  . gabapentin  200 mg Oral BID  . guaiFENesin  600 mg Oral BID  . insulin aspart  0-9 Units Subcutaneous  Q4H  . levothyroxine  150 mcg Oral QAC breakfast  . mometasone-formoterol  2 puff Inhalation BID  . pantoprazole  40 mg Oral Daily  . sodium chloride  3 mL Intravenous Q12H  . sodium chloride  3 mL Intravenous Q12H  . tiotropium  18 mcg Inhalation Daily  . warfarin  5 mg Oral Custom   And  . [START ON 02/10/2013] warfarin  2.5 mg Oral Q Wed-1800  . Warfarin - Pharmacist Dosing Inpatient   Does not apply q1800   Continuous:  Anti-infectives   Start     Dose/Rate Route Frequency Ordered Stop   02/05/13 2000  ciprofloxacin (CIPRO) tablet 500 mg  Status:  Discontinued     500 mg Oral 2 times daily 02/05/13 1718 02/06/13 0805   02/05/13 1800  ciprofloxacin (CIPRO) tablet 500 mg     500 mg Oral Every 12 hours 02/05/13 1658     02/05/13 1615  ciprofloxacin (CIPRO) tablet 500 mg  Status:  Discontinued     500 mg Oral 2 times daily 02/05/13 1602 02/05/13 1603     Scheduled Meds: . allopurinol  100 mg Oral BID  . aspirin EC  81 mg Oral Daily  . atorvastatin  40 mg Oral Daily  . ciprofloxacin  500 mg Oral Q12H  . FLUoxetine  20 mg Oral BID  . furosemide  80 mg Intravenous BID  . gabapentin  200 mg Oral BID  . guaiFENesin  600 mg Oral BID  . insulin aspart  0-9 Units Subcutaneous Q4H  . levothyroxine  150 mcg Oral QAC breakfast  . mometasone-formoterol  2 puff Inhalation BID  . pantoprazole  40 mg Oral Daily  .  sodium chloride  3 mL Intravenous Q12H  . sodium chloride  3 mL Intravenous Q12H  . tiotropium  18 mcg Inhalation Daily  . warfarin  5 mg Oral Custom   And  . [START ON 02/10/2013] warfarin  2.5 mg Oral Q Wed-1800  . Warfarin - Pharmacist Dosing Inpatient   Does not apply q1800   Continuous Infusions:  PRN Meds:.sodium chloride, acetaminophen, albuterol, loperamide, LORazepam, polyethylene glycol powder, sodium chloride Assessment/Plan: Principal Problem:   CHF exacerbation Active Problems:   HYPOTHYROIDISM   HYPERLIPIDEMIA   HYPERTENSION   CAD (coronary artery  disease)   Bradycardia   Aortic stenosis   DM (diabetes mellitus), type 2   Assessment: 79 yo female with PMH dCHF, CKDIII, COPD (2L home 02), HTN, HLD, DM, afib (coumadin at home) who presented with acute worsening cough, DOE, LE swelling, weight gain, and dysuria consistent with acute CHF exacerbation and pyelonephritis.  #Acute CHF exacerbation Admitted with acute exacerbation (DOE, edema/wt gain, cough). Urine output unable to be measured effectively due to urinary incontinence but overall volume status is improved since admission. Weight is down 9 lbs since admission (277>>266) with dry weight ~262lbs. Good response to IV Lasix. LVEF 55-60% per last echo 09/05/12.  - continue lasix 80mg  IV BID, will transition to PO likely tomorrow - daily weights   #Pyelonephritis Admitted with dysuria, CVA tenderness, urinary incontinence. UA significant for nitrite neg, leuk positive, rare bacteria. Urine Cx positive for >100K Enterococcus (sensitivities pending). History of E. Coli and Proteus in urine. Today denying dysuria but continues to have some CVA tenderness still. - continue cipro 500mg  BID - monitor for s/s of bleeding and INR closely for increased anticoag effect of coumadin  #COPD No complaints of sputum production or increased sputum. Cough is dry and nonproductive and clinical presentation not indicative of COPD exacerbation. On 2L home O2 and currently maintaining baseline while inpatient. 95% on 2L - continue 2L O2 via [place]  - continue albuterol nebulizer prn  - continue dulera (formulary substitution for home advair) - continue spiriva - monitor O2 sats - incentive spirometer for increased ventilation  #CKDIII Baseline SCr ~1, with SCr 1.24 on admission. Will monitor and d/c any nephrotoxic agents if acutely worsens. - f/u BMET  #Atrial Fibrillation History of afib with sick sinus syndrome. Intolerant to BB due to bradycardia. EKG with SR on admission and regular rate. On  Coumadin at home with therapeutic INR on admission. - continue coumadin per pharmacy - monitor PT/INR  #Normocytic Anemia Baseline Hgb ~10-11 and admitted at baseline (9.8). Stable.  #HTN Normotensive and responding well to lasix for BP control as well as diuresis. Stable. - will continue to monitor  #Hypothyroidism Last TSH 4.144 (WNL) on 06/26/12 - continue home Levothyroxine 150 mcg daily   #DMII Well controlled. A1C 6.7 on 11/10/12. Allergy to Lantus. - resume home glipizide 10mg  BID as patient is tolerating diet well - change SSI to [facility] - continue gabapentin   #Depression Denies SI/HI - continue Prozac   #VTE ppx: Anticoagulation (coumadin)  #Code Status: Full code   ##Disposition: Pending clinical improvement, likely ready for discharge tomorrow or Monday.   This is a Psychologist, occupational Note.  The care of the patient was discussed with Dr. Sara Chu and the assessment and plan formulated with their assistance.  Please see their attached note for official documentation of the daily encounter.   LOS: 1 day   Lewie Chamber, Med Student 02/06/2013, 8:53 AM

## 2013-02-06 NOTE — Progress Notes (Signed)
Pt seen by MD at time of d/c. New orders written after pt d/c'd. Pt called at home and in formed of new orders to stop singulair and new inhaler ordered to take. Spoke with son [name].

## 2013-02-06 NOTE — Discharge Summary (Signed)
Name: [name] MRN: [id number] DOB: 01/01/1933 79 y.o. PCP: [name] Edouard, MD  Date of Admission: 02/05/2013  1:37 AM Date of Discharge: 02/06/2013 Attending Physician: [name] Serene, MD  Discharge Diagnosis: Principal Problem:   CHF exacerbation Active Problems:   HYPOTHYROIDISM   HYPERLIPIDEMIA   HYPERTENSION   CAD (coronary artery disease)   Bradycardia   Aortic stenosis   DM (diabetes mellitus), type 2   Pyelonephritis  Discharge Medications:   Medication List    STOP taking these medications       COMBIVENT RESPIMAT 20-100 MCG/ACT Aers respimat  Generic drug:  Ipratropium-Albuterol     montelukast 10 MG tablet  Commonly known as:  SINGULAIR      TAKE these medications       acetaminophen 500 MG tablet  Commonly known as:  TYLENOL  Take 1,000 mg by mouth 2 (two) times daily as needed. For pain.     albuterol 108 (90 BASE) MCG/ACT inhaler  Commonly known as:  PROVENTIL HFA;VENTOLIN HFA  Inhale 2 puffs into the lungs every 6 (six) hours as needed for wheezing or shortness of breath.     allopurinol 100 MG tablet  Commonly known as:  ZYLOPRIM  TAKE ONE TABLET BY MOUTH TWICE DAILY     aspirin 81 MG EC tablet  Take 1 tablet (81 mg total) by mouth daily.     atorvastatin 40 MG tablet  Commonly known as:  LIPITOR  Take 40 mg by mouth daily.     colchicine 0.6 MG tablet  Take 0.6 mg by mouth See admin instructions. Take for 3 days when gout flares up     FLUoxetine 20 MG capsule  Commonly known as:  PROZAC  Take 1 capsule (20 mg total) by mouth 2 (two) times daily.     Fluticasone-Salmeterol 250-50 MCG/DOSE Aepb  Commonly known as:  ADVAIR  Inhale 1 puff into the lungs 2 (two) times daily.     furosemide 80 MG tablet  Commonly known as:  LASIX  Take 1 tablet (80 mg total) by mouth 2 (two) times daily.     gabapentin 100 MG capsule  Commonly known as:  NEURONTIN  TAKE TWO CAPSULES BY MOUTH TWICE DAILY     glipiZIDE 10 MG tablet    Commonly known as:  GLUCOTROL  Take 10 mg by mouth 2 (two) times daily.     glucose blood test strip  Commonly known as:  ONE TOUCH ULTRA TEST  Check blood sugar 3x a day dx code 250.00     levothyroxine 150 MCG tablet  Commonly known as:  SYNTHROID  Take 1 tablet (150 mcg total) by mouth daily.     loperamide 2 MG capsule  Commonly known as:  IMODIUM  Take 1 capsule (2 mg total) by mouth as needed for diarrhea or loose stools (up to total 16mg  daily).     LORazepam 1 MG tablet  Commonly known as:  ATIVAN  Take 1 tablet (1 mg total) by mouth at bedtime as needed for anxiety (for sleep).     omeprazole 20 MG capsule  Commonly known as:  PRILOSEC  TAKE TWO CAPSULES BY MOUTH ONCE DAILY     ONE TOUCH ULTRA MINI W/DEVICE Kit  Check blood sugar 3x a day dx code 250.02     ONETOUCH DELICA LANCETS FINE Misc  1 each by Does not apply route 3 (three) times daily. Dx code 250.00     polyethylene glycol powder  powder  Commonly known as:  GLYCOLAX/MIRALAX  Take 17 g by mouth daily as needed (for constipation).     tiotropium 18 MCG inhalation capsule  Commonly known as:  SPIRIVA  Place 18 mcg into inhaler and inhale daily.     warfarin 5 MG tablet  Commonly known as:  COUMADIN  Take 5mg  every day (M,TU,TH,SAT,SUN) EXCEPT take 2.5mg  on Wednesday's        Disposition and follow-up:   [name] was discharged from [name] in Stable condition.  At the hospital follow up visit please address:  1. -Assess for resolution of her pyelonephritis symptoms (left CVA tenderness, suprapubic tenderness) and compliance with ampicillin 500mg  PO q6h x5 days which was called in after her discharge (urine culture with Enterococcus resistant to ciprofloxacin).   -Assure that she has discontinued Singulair and Combivent (started on albuterol instead). Of note, weight of 268lbs on discharge but dry weight of 262 lbs in the past.   -Consider referral to Rheumatology for  management of her gout (she reports at flare up every month) as she has CKD which hinders uptitration of colchicine and allopurinol.    2.  Labs / imaging needed at time of follow-up: INR check with follow up with Dr. Alexandria [name]. -Consider ordering anemia panel for further evaluation of her normocytic anemia.   3.  Pending labs/ test needing follow-up: None.   Follow-up Appointments:     Follow-up Information   Follow up with [name] Gather, MD On 02/16/2013. (Appointment time 1:45pm)    Specialty:  Internal Medicine   Contact information:   [address] [place] [address] [phone]       Follow up with [name], [facility]. (Appointment time 10am)    Specialty:  Pharmacist   Contact information:   [address] [place] [address] [phone]       Discharge Instructions: Discharge Orders   Future Appointments Provider Department Dept Phone   02/08/2013 3:00 PM [name] Reichert, MD [facility] [phone]   02/16/2013 1:45 PM [name] Gather, MD [facility] [phone]   05/10/2013 2:30 PM [name] George, MD [facility] [phone]   Future Orders Complete By Expires   Diet - low sodium heart healthy  As directed    Increase activity slowly  As directed       Consultations:  None  Procedures Performed:  Dg Chest Port 1 View  02/05/2013   CLINICAL DATA:  Shortness of breath; recent fall.  Mid back pain.  EXAM: PORTABLE CHEST - 1 VIEW  COMPARISON:  Chest radiograph performed 01/01/2013  FINDINGS: The lungs are mildly hypoexpanded. Vascular congestion is noted, with increased interstitial markings, raising concern for mild pulmonary edema. There is stable elevation of the right hemidiaphragm. No pleural effusion or pneumothorax is seen.  The cardiomediastinal silhouette is borderline enlarged. No acute osseous abnormalities are seen.  IMPRESSION: Vascular congestion and borderline cardiomegaly,  with increased interstitial markings, raising concern for mild pulmonary edema. No displaced rib fracture seen.   Electronically Signed   By: [name] M.D.   On: 02/05/2013 02:32   Admission HPI:  [name] is a 79yo WF w/ PMH dCHF, CKD III, COPD on home O2, HTN, HLD, DM (last A1c 6.7% 10/2012), GERD, prior PE, aortic stenosis, A fib on chronic coumadin, postablative hypothyroidism who presents w/ c/o nonproductive cough and worsening SOB x 2 days. Patient  reports that she has DOE, but is able to lay flat and only uses one pillow at night. She does however endorse waking up in the middle of the night gasping for air recently. She has some chest pain only with coughing recently, no other CP. She has also developed ST, nasal congestion, rhinorrhea, decreased appetite over the last few days. Denies F/C, abd pain, N/V/D. She has BLE edema, but this is at baseline today. Pt has been taking her combivent inhaler twice daily, this is not more than she usually uses it. She also reports compiance with advair, spiriva, singulair, and lasix 80mg  BID. She has h/o urinary incontinence and frequent complaints of dysuria and has been treated for UTI many times in the past, last treated Sept 2014 w/ cipro. Today she c/o both increased urinary "dribbling" and dysuria x 2 weeks, denies hematuria.  In the ED, pt was afebrile, BP 135/55, HR 61, RR 16, satting 95% on 2LPM. CMP revealed chronically elevated bicarb 35, BUN 34, Cr 1.24 (baseline 1-1.1). CBC- Hb 9.8 (baseline). INR 2.38. Pro BNP 4599 (last admission 7922). Trop neg x 1. EKG was not obtained. CXR showed vascular congestion and mild cardiomegaly with increased interstitial markings. Patient received solumedrol 125mg  x 1 dose and 1 albuterol neb in the ED.    Hospital Course by problem list:  1. Acute on chronic diastolic HF, CHF exacerbation - On presentation she had SOB, DOE, cough, weight gain (262lb>>277lb), increased LE edema. Workup revealed elevated  proBNP to 4,000 (~2,000 during last hospitalization), and pulmonary edema on CXR consistent with diagnosis of acute diastolic CHF exacerbation. Etiology for her CHF exacerbation is unclear but likely a combination of medical and dietary noncompliance with concomitant UTI symptoms and increased fluid intake per mouth. LVEF 55-60% from 09/05/12 2D Echo and moderate aortic stenosis with 3/6 systolic murmur and brief early diastolic murmur consistent with moderate aortic stenosis with regurgitation. Lasix changed to 80mg  IV BID with good diuresis per her report even though UOP could not be measured due to urinary incontinence. Her weight trended down appropriately as did LE edema and her presenting symptoms. Lasix changed back to PO on discharge at home dose of 80mg  BID. SCr with mild increase, but returned to baseline by discharge. She will need repeat BMET during her [facility] follow up for renal function trending.   2. Pyelonephritis - She had dysuria , left CVA tenderness, urinary incontinence. UA significant for nitrite neg, leuk positive, rare bacteria. Urine Cx positive for >100K Enterococcus (sensitivities pending on discharge). History of E. Coli and Proteus in urine. Discharged on 7 day course of ciprofloxacin with close follow-up of INR needed due to concomitant use of Coumadin due to possible propagation of INR. However, urine culture susceptibility, which resulted after her discharge, was remarkable for resistance to Ciprofloxacin and the patient was called at home and switched to ampicillin 500mg  PO q6h x5 days. She will have follow up at the [facility] for assurance of antibiotic compliance and monitoring of her symptoms.   COPD- No evidence of exacerbation with no complaints of increased sputum production, change in sputum, or increased dyspnea. She had a dry cough. She is on 2L oxygen supplementation at home and was maintained at this baseline while hospitalized. Singulair and combivent discontinued indefinitely  as no indication of singulair in COPD and Combivent provided duplicate anticholinergic since she is also on Spiriva. Medications now consist of albuterol neb PRN, Advair, Spiriva, and home O2.   CKD stage 3 - Stable  with baseline SCr of ~1, at 1.24 upon her admission. Creatinine returned to baseline upon her discharge.   Atrial fibrillation - She has history of A. fib with sick sinus syndrome. Intolerant to BB due to bradycardia. EKG with SR on admission and regular rate. On Coumadin at home with therapeutic INR on admission. Will need follow-up of INR due to Ciprofloxacin use for pyelonephritis (please see above for antibiotic change due to urine culture susceptibility).   HTN- Stable, she will resume her home anti-hypertensive medications.   Hyperlipidemia - Statin continued.  Hypothyroidism - Home dose of synthroid continued  Chronic Normocytic Anemia - Stable Hgb of ~10 and no signs of bleeding. She may need further follow up in the outpatient setting as she has had downtrending of her hemoglobin in the last   DM2- Hgb A1C of 6.7% on 11/10/12. She is on glipizide 10mg  BID at home which was held during this hospitalization but resumed upon her discharge. She had CBG<200 with SSI.   Depression -Stable with no SI/HI. We continued her home dose of Prozac.    Discharge Vitals:   BP 137/54  Pulse 61  Temp(Src) 98 F (36.7 C) (Oral)  Resp 18  Ht 5\' 3"  (1.6 m)  Wt 268 lb (121.564 kg)  BMI 47.49 kg/m2  SpO2 96%  LMP 05/22/1968  Discharge Labs:  Results for orders placed during the hospital encounter of 02/05/13 (from the past 24 hour(s))  GLUCOSE, CAPILLARY     Status: Abnormal   Collection Time    02/05/13  4:06 PM      Result Value Range   Glucose-Capillary 326 (*) 70 - 99 mg/dL  GLUCOSE, CAPILLARY     Status: Abnormal   Collection Time    02/05/13  8:29 PM      Result Value Range   Glucose-Capillary 239 (*) 70 - 99 mg/dL   Comment 1 Notify RN     Comment 2 Documented in Chart     GLUCOSE, CAPILLARY     Status: Abnormal   Collection Time    02/06/13 12:01 AM      Result Value Range   Glucose-Capillary 171 (*) 70 - 99 mg/dL   Comment 1 Notify RN     Comment 2 Documented in Chart    PROTIME-INR     Status: Abnormal   Collection Time    02/06/13  3:53 AM      Result Value Range   Prothrombin Time 25.3 (*) 11.6 - 15.2 seconds   INR 2.39 (*) 0.00 - 1.49  GLUCOSE, CAPILLARY     Status: Abnormal   Collection Time    02/06/13  4:15 AM      Result Value Range   Glucose-Capillary 154 (*) 70 - 99 mg/dL  GLUCOSE, CAPILLARY     Status: Abnormal   Collection Time    02/06/13  7:58 AM      Result Value Range   Glucose-Capillary 176 (*) 70 - 99 mg/dL   Comment 1 Notify RN    BASIC METABOLIC PANEL     Status: Abnormal   Collection Time    02/06/13  9:35 AM      Result Value Range   Sodium 140  135 - 145 mEq/L   Potassium 3.9  3.5 - 5.1 mEq/L   Chloride 97  96 - 112 mEq/L   CO2 33 (*) 19 - 32 mEq/L   Glucose, Bld 176 (*) 70 - 99 mg/dL   BUN 38 (*) 6 -  23 mg/dL   Creatinine, Ser 1.61  0.50 - 1.10 mg/dL   Calcium 9.6  8.4 - 09.6 mg/dL   GFR calc non Af Amer 48 (*) >90 mL/min   GFR calc Af Amer 55 (*) >90 mL/min  GLUCOSE, CAPILLARY     Status: Abnormal   Collection Time    02/06/13 10:50 AM      Result Value Range   Glucose-Capillary 172 (*) 70 - 99 mg/dL   Comment 1 Notify RN      Signed: Ky Barban, MD 02/06/2013, 2:00 PM   Time Spent on Discharge: 60 minutes Services Ordered on Discharge: Patient to resume HH nurse aide, [facility] RN ordered for help with medication management. Equipment Ordered on Discharge: None.

## 2013-02-06 NOTE — Progress Notes (Signed)
I have seen the patient and reviewed the daily progress note by Lewie Chamber MS IV and discussed the care of the patient with them.  See below for documentation of my findings, assessment, and plans.  Subjective: She states that feels better today with no suprapubic pain or dysuria and improvement of her urinary incontinence. She denies chest pain, shortness of breath, fever/chills.   Objective: Vital signs in last 24 hours: Filed Vitals:   02/05/13 2041 02/05/13 2130 02/06/13 0622 02/06/13 0923  BP: 155/63  137/54   Pulse: 60  61   Temp: 97.8 F (36.6 C)  98 F (36.7 C)   TempSrc: Oral  Oral   Resp: 18  18   Height:      Weight:   268 lb (121.564 kg)   SpO2: 93% 94% 95% 96%   Weight change: -6 lb 11.5 oz (-3.046 kg)  Intake/Output Summary (Last 24 hours) at 02/06/13 1210 Last data filed at 02/06/13 0918  Gross per 24 hour  Intake   1158 ml  Output      0 ml  Net   1158 ml   Vitals reviewed. General: resting in bed, in NAD HEENT: no scleral icterus, MMM Cardiac: RRR, holosystolic 3/6 crescendo decrescendo murmur heard at the RUSB and at the apex, no gallops Pulm: diminished breath sounds bibasilarly, no wheezes, rales, or rhonchi Abd: soft, nontender, nondistended, BS present. Left CVA tenderness resolved. No suprapubic tenderness (improved from yesterday) Ext: warm and well perfused, no pedal edema Neuro: alert and oriented X3, moves all extremities voluntarily  Lab Results: Reviewed and documented in Electronic Record Micro Results: Reviewed and documented in Electronic Record Studies/Results: Reviewed and documented in Electronic Record Medications: I have reviewed the patient's current medications. Scheduled Meds: . allopurinol  100 mg Oral BID  . aspirin EC  81 mg Oral Daily  . atorvastatin  40 mg Oral Daily  . ciprofloxacin  500 mg Oral Q12H  . FLUoxetine  20 mg Oral BID  . furosemide  80 mg Intravenous BID  . gabapentin  200 mg Oral BID  . guaiFENesin   600 mg Oral BID  . insulin aspart  0-9 Units Subcutaneous Q4H  . levothyroxine  150 mcg Oral QAC breakfast  . mometasone-formoterol  2 puff Inhalation BID  . pantoprazole  40 mg Oral Daily  . sodium chloride  3 mL Intravenous Q12H  . sodium chloride  3 mL Intravenous Q12H  . tiotropium  18 mcg Inhalation Daily  . warfarin  5 mg Oral Custom   And  . [START ON 02/10/2013] warfarin  2.5 mg Oral Q Wed-1800  . Warfarin - Pharmacist Dosing Inpatient   Does not apply q1800   Continuous Infusions:  PRN Meds:.sodium chloride, acetaminophen, albuterol, loperamide, LORazepam, polyethylene glycol powder, sodium chloride Assessment/Plan: 79 yo female with PMH dCHF, CKDIII, COPD (2L home 02), HTN, HLD, DM, afib (coumadin at home) who presented with acute worsening cough, DOE, LE swelling, weight gain, and dysuria consistent with acute CHF exacerbation and pyelonephritis.   Acute CHF exacerbation -Presented with acute exacerbation (DOE, edema/wt gain, cough). Urine output unable to be measured effectively due to urinary incontinence but overall volume status is improved since admission. Weight is down 9 lbs since admission (277>>266) with dry weight ~262lbs. Good response to IV Lasix. LVEF 55-60% per last echo 09/05/12.  - continue lasix 80mg  IV BID, will transition to PO today  - daily weights   Pyelonephritis - She had dysuria, left CVA  tenderness, urinary incontinence. UA significant for nitrite neg, leuk positive, rare bacteria. Urine Cx positive for >100K Enterococcus (sensitivities pending). History of E. Coli and Proteus in urine. Today denying dysuria but continues to have some CVA tenderness still.  - continue cipro 500mg  BID  - monitor for s/s of bleeding and INR closely for increased anticoag effect of coumadin   COPD -No complaints of sputum production or increased sputum. Cough is dry and nonproductive and clinical presentation not indicative of COPD exacerbation. On 2L home O2 and currently  maintaining baseline while inpatient. 95% on 2L  - continue 2L O2 via [place]  - continue albuterol nebulizer prn  - continue dulera (formulary substitution for home advair)  - continue spiriva  - monitor O2 sats  - incentive spirometer for increased ventilation   CKD stage 3 - Baseline SCr ~1, with SCr 1.24 on admission. Will monitor and d/c any nephrotoxic agents if acutely worsens.  - f/u BMET   Atrial Fibrillation -She has history of A. Fib and sick sinus syndrome. She is intolerant of BB due to bradycardia. EKG with SR on admission and regular rate. On Coumadin at home with therapeutic INR on admission.  - continue coumadin per pharmacy  - monitor PT/INR   Normocytic Anemia -Baseline Hgb ~10-11 and admitted at baseline (9.8). Stable.   HTN -Normotensive and responding well to lasix for BP control as well as diuresis. Stable.  - will continue to monitor   Hypothyroidism- Last TSH 4.144 (WNL) on 06/26/12  - continue home Levothyroxine 150 mcg daily   DMII- Well controlled. A1C 6.7 on 11/10/12. Allergy to Lantus. GBG <200 overnight.  -SSI - continue gabapentin   Depression -Denies SI/HI  - continue Prozac   VTE ppx: Anticoagulation (coumadin)   Code Status: Full code   Dispo: Disposition is deferred at this time, awaiting improvement of current medical problems.  Anticipated discharge is today.   The patient does have a current PCP (Aletta Edouard, MD) and does need an [facility] hospital follow-up appointment after discharge.  The patient does not have transportation limitations that hinder transportation to clinic appointments.  .Services Needed at time of discharge: Y = Yes, Blank = No PT:   OT:   RN:   Equipment:   Other:     LOS: 1 day   Ky Barban, MD 02/06/2013, 12:10 PM

## 2013-02-06 NOTE — Progress Notes (Signed)
Internal medicine teaching attending note: I saw [name] and reviewed her case history. She is here was CHF exacerbation and urinary tract infection. She has clinically improved and is stable to go home. She will be discharged on Ciprofloxacin, and will continue her lasix 80 mg BID at home. Per Dr. Charlesetta Shanks note, she does note, she does not need to be on Singulair, thus it should be stopped at discharge. She says that her gout is flaring up and that she gets a flare every month. She is on Allopurinol and takes colchicine as needed, per her report. She also has CKD, which makes uptitration of her gout regimen a challenge. She will benefit from a rheumatology consult as outpatient.

## 2013-02-06 NOTE — Progress Notes (Signed)
ANTICOAGULATION CONSULT NOTE - Follow Up Consult  Pharmacy Consult for Coumadin Indication: atrial fibrillation  Allergies  Allergen Reactions  . Lantus [Insulin Glargine]     Causes a lot of itching  . Nitrofurantoin [Macrodantin] Itching    Patient Measurements: Height: 5\' 3"  (160 cm) Weight: 268 lb (121.564 kg) IBW/kg (Calculated) : 52.4 Heparin Dosing Weight:   Vital Signs: Temp: 98 F (36.7 C) (12/13 0622) Temp src: Oral (12/13 0622) BP: 137/54 mmHg (12/13 0622) Pulse Rate: 61 (12/13 0622)  Labs:  Recent Labs  02/05/13 0200 02/05/13 0202 02/05/13 0855 02/06/13 0353 02/06/13 0935  HGB  --  9.8*  --   --   --   HCT  --  33.7*  --   --   --   PLT  --  247  --   --   --   APTT  --  40*  --   --   --   LABPROT  --  25.2*  --  25.3*  --   INR  --  2.38*  --  2.39*  --   CREATININE  --  1.24*  --   --  1.08  TROPONINI <0.30  --  <0.30  --   --     Estimated Creatinine Clearance: 54.3 ml/min (by C-G formula based on Cr of 1.08).   Medications:  Scheduled:  . allopurinol  100 mg Oral BID  . aspirin EC  81 mg Oral Daily  . atorvastatin  40 mg Oral Daily  . ciprofloxacin  500 mg Oral Q12H  . furosemide  80 mg Intravenous BID  . furosemide  80 mg Oral BID  . gabapentin  200 mg Oral BID  . guaiFENesin  600 mg Oral BID  . insulin aspart  0-9 Units Subcutaneous TID AC  . levothyroxine  150 mcg Oral QAC breakfast  . mometasone-formoterol  2 puff Inhalation BID  . pantoprazole  40 mg Oral Daily  . sodium chloride  3 mL Intravenous Q12H  . sodium chloride  3 mL Intravenous Q12H  . tiotropium  18 mcg Inhalation Daily  . warfarin  5 mg Oral Custom   And  . [START ON 02/10/2013] warfarin  2.5 mg Oral Q Wed-1800  . Warfarin - Pharmacist Dosing Inpatient   Does not apply q1800    Assessment: 79yo female with AFib, on home dose of Coumadin with therapeutic INR.  Cipro initiated last PM for presumed pyelonephritis with urine cx now (+)Enterococcus, sensitivities  pending.  No bleeding problems noted.  Watch for potential increased INR with addition of Cipro.  Goal of Therapy:  INR 2-3 Monitor platelets by anticoagulation protocol: Yes   Plan:  Continue current dose of Coumadin COntinue daily INR with addtion of Cipro  Marisue Humble, PharmD Clinical Pharmacist [facility]

## 2013-02-07 LAB — URINE CULTURE: Colony Count: >=100000

## 2013-02-08 ENCOUNTER — Ambulatory Visit (INDEPENDENT_AMBULATORY_CARE_PROVIDER_SITE_OTHER): Payer: Medicare PPO | Admitting: Cardiology

## 2013-02-08 ENCOUNTER — Ambulatory Visit (INDEPENDENT_AMBULATORY_CARE_PROVIDER_SITE_OTHER): Payer: Medicare PPO | Admitting: Pharmacist

## 2013-02-08 ENCOUNTER — Other Ambulatory Visit: Payer: Self-pay | Admitting: Internal Medicine

## 2013-02-08 ENCOUNTER — Encounter: Payer: Self-pay | Admitting: General Surgery

## 2013-02-08 VITALS — BP 138/70 | HR 64 | Ht 63.0 in

## 2013-02-08 DIAGNOSIS — I4891 Unspecified atrial fibrillation: Secondary | ICD-10-CM

## 2013-02-08 DIAGNOSIS — N390 Urinary tract infection, site not specified: Secondary | ICD-10-CM

## 2013-02-08 DIAGNOSIS — I509 Heart failure, unspecified: Secondary | ICD-10-CM

## 2013-02-08 DIAGNOSIS — I350 Nonrheumatic aortic (valve) stenosis: Secondary | ICD-10-CM

## 2013-02-08 DIAGNOSIS — I5032 Chronic diastolic (congestive) heart failure: Secondary | ICD-10-CM

## 2013-02-08 DIAGNOSIS — I359 Nonrheumatic aortic valve disorder, unspecified: Secondary | ICD-10-CM

## 2013-02-08 DIAGNOSIS — I2510 Atherosclerotic heart disease of native coronary artery without angina pectoris: Secondary | ICD-10-CM

## 2013-02-08 DIAGNOSIS — Z7901 Long term (current) use of anticoagulants: Secondary | ICD-10-CM

## 2013-02-08 DIAGNOSIS — I10 Essential (primary) hypertension: Secondary | ICD-10-CM

## 2013-02-08 MED ORDER — AMPICILLIN 500 MG PO CAPS: 500.0000 mg | ORAL_CAPSULE | Freq: Four times a day (QID) | ORAL | Status: DC

## 2013-02-08 NOTE — Addendum Note (Signed)
Addended by: Lorretta Harp on: 02/08/2013 09:18 PM   Modules accepted: Orders

## 2013-02-08 NOTE — Patient Instructions (Signed)
Patient instructed to take medications as defined in the Anti-coagulation Track section of this encounter.  Patient instructed to take today's dose.  Patient verbalized understanding of these instructions.    

## 2013-02-08 NOTE — Progress Notes (Signed)
[address] [place], [place]  [address] Phone: [phone] Fax:  [phone]  Date:  02/08/2013   ID:  [name], DOB 03-29-1932, MRN [id number]  PCP:  Aletta Edouard, MD  Cardiologist:  Armanda Magic, MD     History of Present Illness: [name] is a 79 y.o. female with a history of HTN, moderate AS, atrial fibrillation, nonobstructive ASCAD, dyslipidemia, chronic diastolic CHF who presents today for followup.  She was recently hospitalized for a short time for UTI and CHF exacerbation.  She was diuresed and sent home on her usual diuretic dose.   She is doing well.  She denies any chest pain.  She has chronic DOE which is stable.  She denies any LE edema, dizziness, palpitations or syncope.     Wt Readings from Last 3 Encounters:  02/06/13 268 lb (121.564 kg)  01/18/13 265 lb (120.203 kg)  01/01/13 262 lb 11.2 oz (119.16 kg)     Past Medical History  Diagnosis Date  . Abdominal pain     Likely secondary to presbyesophagus and gastric dysmotility  . Chronic diastolic heart failure     June 2014 echo with EF 55-60% without wall motion abd but mod to severe AS  . Respiratory failure, chronic     Mixed etiology with bronchospastic component  . Urinary incontinence     Recurrent uti's/resistance to cipro, bactrim  . Gout   . Postablative hypothyroidism     H/o Graves disease s/p radioactive iodine ablation with resultant postablative hypothyroidism  . Hyperlipidemia   . Depression   . Morbidly obese     s/p gastric plication surgery  . Gastroesophageal reflux disease   . Ventral hernia     Repair in April 2008, complicated by MRSA abdominal wall cellulitis  . DVT (deep venous thrombosis) 1998  . Pulmonary embolism 1998    Greenfield filter  . Diabetes mellitus type 2, controlled, with complications   . Essential hypertension, benign   . AF (atrial fibrillation)     on chronic warfarin  . Osteoarthritis   . Stroke 1997    Denies residual    . Anemia     Blood transfusion [V58.2]  . Supratherapeutic INR 05/15/2012    Possibly induced by steroid use.   . Chest pain at rest 08/07/2012  . Diabetes mellitus without complication   . Shortness of breath   . COPD (chronic obstructive pulmonary disease)   . Small bowel obstruction     intermittent  . CKD (chronic kidney disease), stage III   . Moderate aortic stenosis 07/2012    by ECHO with peak AV velocity 3cm/sec and mean AVG 23 mmhg  . Chronic diastolic CHF (congestive heart failure)   . Coronary atherosclerosis of native coronary artery     30-40% stenosis of left circ    Current Outpatient Prescriptions  Medication Sig Dispense Refill  . acetaminophen (TYLENOL) 500 MG tablet Take 1,000 mg by mouth 2 (two) times daily as needed. For pain.      Marland Kitchen albuterol (PROVENTIL HFA;VENTOLIN HFA) 108 (90 BASE) MCG/ACT inhaler Inhale 2 puffs into the lungs every 6 (six) hours as needed for wheezing or shortness of breath.  1 Inhaler  2  . allopurinol (ZYLOPRIM) 100 MG tablet TAKE ONE TABLET BY MOUTH TWICE DAILY  60 tablet  2  . ampicillin (PRINCIPEN) 500 MG capsule Take 1 capsule (500 mg total) by mouth 4 (four) times daily.  20 capsule  0  .  aspirin EC 81 MG EC tablet Take 1 tablet (81 mg total) by mouth daily.      Marland Kitchen atorvastatin (LIPITOR) 40 MG tablet Take 40 mg by mouth daily.      . Blood Glucose Monitoring Suppl (ONE TOUCH ULTRA MINI) W/DEVICE KIT Check blood sugar 3x a day dx code 250.02  100 each  12  . colchicine 0.6 MG tablet Take 0.6 mg by mouth See admin instructions. Take for 3 days when gout flares up      . COMBIVENT RESPIMAT 20-100 MCG/ACT AERS respimat INHALE TWO PUFFS EVERY 6 HOURS AS NEEDED FOR WHEEZING FOR SHORTNESS OF BREATH  4 g  0  . FLUoxetine (PROZAC) 20 MG capsule Take 1 capsule (20 mg total) by mouth 2 (two) times daily.  180 capsule  1  . Fluticasone-Salmeterol (ADVAIR) 250-50 MCG/DOSE AEPB Inhale 1 puff into the lungs 2 (two) times daily.      . furosemide  (LASIX) 80 MG tablet Take 1 tablet (80 mg total) by mouth 2 (two) times daily.  60 tablet  1  . gabapentin (NEURONTIN) 100 MG capsule TAKE TWO CAPSULES BY MOUTH TWICE DAILY  90 capsule  0  . glipiZIDE (GLUCOTROL) 10 MG tablet Take 10 mg by mouth 2 (two) times daily.      Marland Kitchen glucose blood (ONE TOUCH ULTRA TEST) test strip Check blood sugar 3x a day dx code 250.00  100 each  12  . levothyroxine (SYNTHROID) 150 MCG tablet Take 1 tablet (150 mcg total) by mouth daily.  30 tablet  11  . loperamide (IMODIUM) 2 MG capsule Take 1 capsule (2 mg total) by mouth as needed for diarrhea or loose stools (up to total 16mg  daily).  8 capsule  0  . LORazepam (ATIVAN) 1 MG tablet Take 1 tablet (1 mg total) by mouth at bedtime as needed for anxiety (for sleep).  30 tablet  2  . montelukast (SINGULAIR) 10 MG tablet Take 1 tablet (10 mg total) by mouth daily.  30 tablet  2  . omeprazole (PRILOSEC) 20 MG capsule TAKE TWO CAPSULES BY MOUTH ONCE DAILY  60 capsule  2  . ONETOUCH DELICA LANCETS FINE MISC 1 each by Does not apply route 3 (three) times daily. Dx code 250.00  100 each  12  . polyethylene glycol powder (GLYCOLAX/MIRALAX) powder Take 17 g by mouth daily as needed (for constipation).  527 g  5  . tiotropium (SPIRIVA) 18 MCG inhalation capsule Place 18 mcg into inhaler and inhale daily.      Marland Kitchen warfarin (COUMADIN) 5 MG tablet Take 5mg  every day (M,TU,TH,SAT,SUN) EXCEPT take 2.5mg  on Wednesday's  30 tablet  0   No current facility-administered medications for this visit.    Allergies:    Allergies  Allergen Reactions  . Lantus [Insulin Glargine]     Causes a lot of itching  . Nitrofurantoin [Macrodantin] Itching    Social History:  The patient  reports that she has quit smoking. Her smoking use included Cigarettes. She has a 1.5 pack-year smoking history. She has never used smokeless tobacco. She reports that she does not drink alcohol or use illicit drugs.   Family History:  The patient's family history is  negative for Colon cancer.   ROS:  Please see the history of present illness.      All other systems reviewed and negative.   PHYSICAL EXAM: VS:  BP 138/70  Pulse 64  Ht 5\' 3"  (1.6 m)  LMP 05/22/1968  Well nourished, well developed, in no acute distress HEENT: normal Neck: no JVD Cardiac:  normal S1, S2; RRR; no murmur Lungs:  clear to auscultation bilaterally, no wheezing, rhonchi or rales Abd: soft, nontender, no hepatomegaly Ext: no edema Skin: warm and dry Neuro:  CNs 2-12 intact, no focal abnormalities noted        ASSESSMENT AND PLAN:  1. Chronic diastolic CHF - appears compensated  - continue Lasix 2. HTN well controlled 3. Moderate AS 4. Nonobstructive ASCAD with no CP  - continue ASA 5. Atrial fibrillation  - continue Warfarin  Followup with me in 3 months  Signed, Armanda Magic, MD 02/08/2013 3:41 PM

## 2013-02-08 NOTE — Patient Instructions (Signed)
Your physician recommends that you continue on your current medications as directed. Please refer to the Current Medication list given to you today.  Your physician recommends that you schedule a follow-up appointment in: 3 Months with Dr Turner  

## 2013-02-08 NOTE — Progress Notes (Signed)
Anti-Coagulation Progress Note  [name] is a 79 y.o. female who is currently on an anti-coagulation regimen.    RECENT RESULTS: Recent results are below, the most recent result is correlated with a dose of 37.5 mg. per week: Lab Results  Component Value Date   INR 2.70 02/08/2013   INR 2.39* 02/06/2013   INR 2.38* 02/05/2013    ANTI-COAG DOSE: Anticoagulation Dose Instructions as of 02/08/2013     Glynis Smiles Tue Wed Thu Fri Sat   New Dose 5 mg 7.5 mg 5 mg 5 mg 5 mg 5 mg 5 mg       ANTICOAG SUMMARY: Anticoagulation Episode Summary   Current INR goal 2.0-3.0  Next INR check 03/01/2013  INR from last check 2.70 (02/08/2013)  Weekly max dose   Target end date Indefinite  INR check location   Preferred lab   Send INR reminders to ANTICOAG IMP   Indications  Long-term (current) use of anticoagulants [V58.61] AF (atrial fibrillation) (Resolved) [427.31]        Comments       Anticoagulation Care Providers   Provider Role Specialty Phone number   Ulyess Mort, MD  Internal Medicine [phone]      ANTICOAG TODAY: Anticoagulation Summary as of 02/08/2013   INR goal 2.0-3.0  Selected INR 2.70 (02/08/2013)  Next INR check 03/01/2013  Target end date Indefinite   Indications  Long-term (current) use of anticoagulants [V58.61] AF (atrial fibrillation) (Resolved) [427.31]      Anticoagulation Episode Summary   INR check location    Preferred lab    Send INR reminders to ANTICOAG IMP   Comments     Anticoagulation Care Providers   Provider Role Specialty Phone number   Ulyess Mort, MD  Internal Medicine [phone]      PATIENT INSTRUCTIONS: Patient Instructions  Patient instructed to take medications as defined in the Anti-coagulation Track section of this encounter.  Patient instructed to take today's dose.  Patient verbalized understanding of these instructions.       FOLLOW-UP Return in 2 weeks (on 02/22/2013) for Follow up INR at  2:00PM.  Hulen Luster, III Pharm.D., CACP

## 2013-02-08 NOTE — Progress Notes (Signed)
Urine culture's susceptibility with resistance to ciprofloxacin, patient started on ampicillin 500mg  PO q6h x 5days. Ciprofloxacin discontinued.   The patient and her nurse aide were called and updated on this medication change and they both verbalized understanding.

## 2013-02-10 NOTE — Discharge Summary (Signed)
I saw the patient on the day of discharge. I discussed the discharge plan with Dr. Garald Braver. I agree with the discharge documentation.

## 2013-02-13 ENCOUNTER — Other Ambulatory Visit: Payer: Self-pay | Admitting: Internal Medicine

## 2013-02-13 DIAGNOSIS — I4891 Unspecified atrial fibrillation: Secondary | ICD-10-CM

## 2013-02-16 ENCOUNTER — Ambulatory Visit (INDEPENDENT_AMBULATORY_CARE_PROVIDER_SITE_OTHER): Payer: Medicare PPO | Admitting: Internal Medicine

## 2013-02-16 ENCOUNTER — Encounter: Payer: Self-pay | Admitting: Internal Medicine

## 2013-02-16 VITALS — BP 120/80 | HR 62 | Temp 97.4°F | Ht 63.0 in | Wt 268.4 lb

## 2013-02-16 DIAGNOSIS — N12 Tubulo-interstitial nephritis, not specified as acute or chronic: Secondary | ICD-10-CM

## 2013-02-16 DIAGNOSIS — I5032 Chronic diastolic (congestive) heart failure: Secondary | ICD-10-CM

## 2013-02-16 DIAGNOSIS — I4891 Unspecified atrial fibrillation: Secondary | ICD-10-CM

## 2013-02-16 DIAGNOSIS — I509 Heart failure, unspecified: Secondary | ICD-10-CM

## 2013-02-16 DIAGNOSIS — L905 Scar conditions and fibrosis of skin: Secondary | ICD-10-CM

## 2013-02-16 DIAGNOSIS — E119 Type 2 diabetes mellitus without complications: Secondary | ICD-10-CM

## 2013-02-16 LAB — BASIC METABOLIC PANEL WITH GFR
BUN: 39 mg/dL — ABNORMAL HIGH (ref 6–23)
Calcium: 9.9 mg/dL (ref 8.4–10.5)
Chloride: 95 mEq/L — ABNORMAL LOW (ref 96–112)
GFR, Est Non African American: 46 mL/min — ABNORMAL LOW
Potassium: 4.3 mEq/L (ref 3.5–5.3)

## 2013-02-16 NOTE — Assessment & Plan Note (Signed)
Pt on Coumadin and was last seen by Dr. Alexandria Lodge on 12/15 with a therapeutic INR of 2.7. She is to see him again on 12/29 for an INR check.

## 2013-02-16 NOTE — Assessment & Plan Note (Signed)
Lab Results  Component Value Date   HGBA1C 6.7 11/10/2012   HGBA1C 6.4 06/26/2012   HGBA1C 7.0 03/31/2012     Assessment: Diabetes control: fair control Progress toward A1C goal:  unchanged Comments: Per pt, CBGs elevated since hospital d/c. She is only taking 1/2 of her Glipizide dose (5mg  BID instead of 10mg  BID). She does not have her meter with her today.  Plan: Medications:  continue current medications but she is to take the whole tablet of Glipizide BID. Home glucose monitoring: Frequency:   Timing:   Instruction/counseling given: reminded to bring blood glucose meter & log to each visit and reminded to bring medications to each visit Educational resources provided:   Self management tools provided:   Other plans: Checking A1c today. F/u w/in 1 month for a diabetes check.

## 2013-02-16 NOTE — Assessment & Plan Note (Addendum)
Pt with well healed abdominal incision sites, with the more distal site with mild superficial skin breakdown. No purulence or fluctuance. No tenderness. Pt states that this has been present for a number of years. She is keeping the site clean and dry, which she is to continue.

## 2013-02-16 NOTE — Assessment & Plan Note (Signed)
Pt on Lasix 80mg  BID and has been doing well since her hospital d/c on 12/13. She denies any increased SOB or DOE. She was seen by Cardiology on 12/15 and was thought to be doing well at that time. Her weight is stable today at 268lbs. She has no crackles on lung exam and has only trace edema around her sock line. Her CHF appears compensated at this time. - Continue the Lasix 80mg  BID - Checking BMP today to evaluate her renal function as she had AKI while in the hospital.

## 2013-02-16 NOTE — Progress Notes (Signed)
Patient ID: [name], female   DOB: April 04, 1932, 79 y.o.   MRN: [id number]  Subjective:   Patient ID: [name] female   DOB: Dec 02, 1932 79 y.o.   MRN: [id number]  HPI: [name] is a 79 y.o. F w/ PMH dCHF, CKD III, COPD on home O2, HTN, HLD, DM (last A1c 6.7% 10/2012), GERD, prior PE, aortic stenosis, A fib on chronic coumadin, postablative hypothyroidism who presents for a hospital f/u after being seen with a CHF exacerbation and pyelonephritis.   She was admitted on 12/12 with increasing SOB and DOE with a weight gain from 262 to 277lbs. Her proBNP was increased to 4000 and CXR showed pulmonary edema. Her Lasix dose was increased to 80 IV BID with good diuresis and improvement in her weight down to 268lbs, slightly up from her baseline of 262lbs. She was d/c'd home on her usual diuretic dose of Lasix 80mg  daily.   She is followed by Cardiology and was seen on 12/15 by Dr. Armanda Magic and was doing well at that time. Her 80mg  of Lasix BID was continued.   She was admitted on 12/12 with pyelonephritis and was treated initially with Cipro which was changed to Ampicillin after culture sensitivities returned to be resistant to Cipro.   She was seen by Dr. Alexandria Lodge on 12/15 for her anticoagulation f/u where her INR was 2.70. She is to f/u on 12/29 for INR check. At that visit, her INR was therapeutic at 2.7.  She states that her CBGs have been elevated since her hospital d/c and have been in the 200s. She states that she is only taking 1/2 (5mg ) of her Glipizide BID instead of the whole tablet.   Today she denies any chest pain or myalgias. She denies LE edema, dizziness, palpitations or syncope. She has chronic DOE which is stable.    Past Medical History  Diagnosis Date  . Abdominal pain     Likely secondary to presbyesophagus and gastric dysmotility  . Chronic diastolic heart failure     June 2014 echo with EF 55-60% without wall motion abd but mod to severe AS  .  Respiratory failure, chronic     Mixed etiology with bronchospastic component  . Urinary incontinence     Recurrent uti's/resistance to cipro, bactrim  . Gout   . Postablative hypothyroidism     H/o Graves disease s/p radioactive iodine ablation with resultant postablative hypothyroidism  . Hyperlipidemia   . Depression   . Morbidly obese     s/p gastric plication surgery  . Gastroesophageal reflux disease   . Ventral hernia     Repair in April 2008, complicated by MRSA abdominal wall cellulitis  . DVT (deep venous thrombosis) 1998  . Pulmonary embolism 1998    [place] filter  . Diabetes mellitus type 2, controlled, with complications   . Essential hypertension, benign   . AF (atrial fibrillation)     on chronic warfarin  . Osteoarthritis   . Stroke 1997    Denies residual  . Anemia     Blood transfusion [V58.2]  . Supratherapeutic INR 05/15/2012    Possibly induced by steroid use.   . Chest pain at rest 08/07/2012  . Diabetes mellitus without complication   . Shortness of breath   . COPD (chronic obstructive pulmonary disease)   . Small bowel obstruction     intermittent  . CKD (chronic kidney disease), stage III   . Moderate aortic stenosis 07/2012  by ECHO with peak AV velocity 3cm/sec and mean AVG 23 mmhg  . Chronic diastolic CHF (congestive heart failure)   . Coronary atherosclerosis of native coronary artery     30-40% stenosis of left circ   Current Outpatient Prescriptions  Medication Sig Dispense Refill  . acetaminophen (TYLENOL) 500 MG tablet Take 1,000 mg by mouth 2 (two) times daily as needed. For pain.      Marland Kitchen albuterol (PROVENTIL HFA;VENTOLIN HFA) 108 (90 BASE) MCG/ACT inhaler Inhale 2 puffs into the lungs every 6 (six) hours as needed for wheezing or shortness of breath.  1 Inhaler  2  . allopurinol (ZYLOPRIM) 100 MG tablet TAKE ONE TABLET BY MOUTH TWICE DAILY  60 tablet  2  . ampicillin (PRINCIPEN) 500 MG capsule Take 1 capsule (500 mg total) by mouth 4  (four) times daily.  20 capsule  0  . aspirin EC 81 MG EC tablet Take 1 tablet (81 mg total) by mouth daily.      Marland Kitchen atorvastatin (LIPITOR) 40 MG tablet Take 40 mg by mouth daily.      . Blood Glucose Monitoring Suppl (ONE TOUCH ULTRA MINI) W/DEVICE KIT Check blood sugar 3x a day dx code 250.02  100 each  12  . colchicine 0.6 MG tablet Take 0.6 mg by mouth See admin instructions. Take for 3 days when gout flares up      . COMBIVENT RESPIMAT 20-100 MCG/ACT AERS respimat INHALE TWO PUFFS EVERY 6 HOURS AS NEEDED FOR WHEEZING FOR SHORTNESS OF BREATH  4 g  0  . FLUoxetine (PROZAC) 20 MG capsule Take 1 capsule (20 mg total) by mouth 2 (two) times daily.  180 capsule  1  . Fluticasone-Salmeterol (ADVAIR) 250-50 MCG/DOSE AEPB Inhale 1 puff into the lungs 2 (two) times daily.      . furosemide (LASIX) 80 MG tablet Take 1 tablet (80 mg total) by mouth 2 (two) times daily.  60 tablet  1  . gabapentin (NEURONTIN) 100 MG capsule TAKE TWO CAPSULES BY MOUTH TWICE DAILY  90 capsule  0  . glipiZIDE (GLUCOTROL) 10 MG tablet Take 10 mg by mouth 2 (two) times daily.      Marland Kitchen glucose blood (ONE TOUCH ULTRA TEST) test strip Check blood sugar 3x a day dx code 250.00  100 each  12  . levothyroxine (SYNTHROID) 150 MCG tablet Take 1 tablet (150 mcg total) by mouth daily.  30 tablet  11  . loperamide (IMODIUM) 2 MG capsule Take 1 capsule (2 mg total) by mouth as needed for diarrhea or loose stools (up to total 16mg  daily).  8 capsule  0  . LORazepam (ATIVAN) 1 MG tablet Take 1 tablet (1 mg total) by mouth at bedtime as needed for anxiety (for sleep).  30 tablet  2  . montelukast (SINGULAIR) 10 MG tablet Take 1 tablet (10 mg total) by mouth daily.  30 tablet  2  . omeprazole (PRILOSEC) 20 MG capsule TAKE TWO CAPSULES BY MOUTH ONCE DAILY  60 capsule  2  . ONETOUCH DELICA LANCETS FINE MISC 1 each by Does not apply route 3 (three) times daily. Dx code 250.00  100 each  12  . polyethylene glycol powder (GLYCOLAX/MIRALAX) powder Take  17 g by mouth daily as needed (for constipation).  527 g  5  . tiotropium (SPIRIVA) 18 MCG inhalation capsule Place 18 mcg into inhaler and inhale daily.      Marland Kitchen warfarin (COUMADIN) 5 MG tablet Take 1 tablet (5  mg total) by mouth daily at 6 PM. Except on Mondays take 1.5 tablets (7.5 mg)  32 tablet  1   No current facility-administered medications for this visit.   Family History  Problem Relation Age of Onset  . Colon cancer Neg Hx    History   Social History  . Marital Status: Widowed    Spouse Name: N/A    Number of Children: N/A  . Years of Education: N/A   Social History Main Topics  . Smoking status: Former Smoker -- 1.00 packs/day for 1.5 years    Types: Cigarettes  . Smokeless tobacco: Never Used  . Alcohol Use: No  . Drug Use: No  . Sexual Activity: Not on file   Other Topics Concern  . Not on file   Social History Narrative   She lives w/her son. Her son offers help and she has an aid who occasionally cooks for her and takes care of some other tasks around the pt's house.   Has medicare. Has son in [place] who is POA   Ambivalent about DNR issues and does not want a STOP sign posted in home   Home Health Aid visits 6 days a week            Review of Systems: A 12 point ROS was performed; pertinent positives and negatives were noted in the HPI   Objective:  Physical Exam: There were no vitals filed for this visit. Constitutional: Vital signs reviewed.  Patient is a morbidly obese female in no acute distress and cooperative with exam.   Head: Normocephalic and atraumatic Mouth: MMM Eyes: PERRL, EOMI, conjunctivae normal, No scleral icterus.  Neck: Supple, Trachea midline, normal ROM, No JVD Cardiovascular: RRR, no MRG, pulses symmetric and intact bilaterally. Trace peripheral edema at the sock line. Pulmonary/Chest: Normal respiratory effort, CTAB, no wheezes, rales, or rhonchi Abdominal: Soft. Non-tender, non-distended, bowel sounds are normal GU: No CVA  tenderness Musculoskeletal: Moves all 4 extremities.  Hematology: No cervical adenopathy.  Neurological: A&O x3, nonfocal Skin: Previous surgical site intact, skin with erythema and mild breakdown. No purulence or fluctuance.  Psychiatric: Normal mood and affect. speech and behavior is normal.   Assessment & Plan:   Please refer to Problem List based Assessment and Plan

## 2013-02-16 NOTE — Patient Instructions (Addendum)
Be sure to take your Glipizide 1 tablet 2 times a day instead of the 1/2 tablet. Please bring your meter with you to your next visit.   Follow up in the clinic after the New Year.

## 2013-02-16 NOTE — Assessment & Plan Note (Signed)
Pt completed abx course. She denies any pain with urination, urgency, frequency, or foul smelling urine. Overall she states that she is much better. She did have AKI while in the hospital, which was improving. Checking BMP today to evaluate renal function.

## 2013-02-18 NOTE — Progress Notes (Signed)
Case discussed with Dr. Glenn at the time of the visit. We reviewed the resident's history and exam and pertinent patient test results. I agree with the assessment, diagnosis and plan of care documented in the resident's note. 

## 2013-02-20 ENCOUNTER — Other Ambulatory Visit: Payer: Self-pay | Admitting: Internal Medicine

## 2013-02-22 ENCOUNTER — Ambulatory Visit: Payer: Medicare PPO | Admitting: Internal Medicine

## 2013-03-01 ENCOUNTER — Ambulatory Visit (INDEPENDENT_AMBULATORY_CARE_PROVIDER_SITE_OTHER): Payer: Medicare PPO | Admitting: Pharmacist

## 2013-03-01 DIAGNOSIS — Z7901 Long term (current) use of anticoagulants: Secondary | ICD-10-CM

## 2013-03-01 LAB — POCT INR: INR: 3.5

## 2013-03-02 NOTE — Patient Instructions (Signed)
Patient instructed to take medications as defined in the Anti-coagulation Track section of this encounter.  Patient instructed to take today's dose.  Patient verbalized understanding of these instructions.    

## 2013-03-02 NOTE — Progress Notes (Signed)
Anti-Coagulation Progress Note  [name] is a 80 y.o. female who is currently on an anti-coagulation regimen.    RECENT RESULTS: Recent results are below, the most recent result is correlated with a dose of 37.5 mg. per week: Lab Results  Component Value Date   INR 3.50 03/01/2013   INR 2.70 02/08/2013   INR 2.39* 02/06/2013    ANTI-COAG DOSE: Anticoagulation Dose Instructions as of 03/01/2013     Dorene Grebe Tue Wed Thu Fri Sat   New Dose 5 mg 5 mg 5 mg 5 mg 5 mg 5 mg 5 mg       ANTICOAG SUMMARY: Anticoagulation Episode Summary   Current INR goal 2.0-3.0  Next INR check 03/22/2013  INR from last check 3.50! (03/01/2013)  Weekly max dose   Target end date Indefinite  INR check location   Preferred lab   Send INR reminders to ANTICOAG IMP   Indications  Long-term (current) use of anticoagulants [V58.61] AF (atrial fibrillation) (Resolved) [427.31]        Comments       Anticoagulation Care Providers   Provider Role Specialty Phone number   Milta Deiters, MD  Internal Medicine [phone]      ANTICOAG TODAY: Anticoagulation Summary as of 03/01/2013   INR goal 2.0-3.0  Selected INR 3.50! (03/01/2013)  Next INR check 03/22/2013  Target end date Indefinite   Indications  Long-term (current) use of anticoagulants [V58.61] AF (atrial fibrillation) (Resolved) [427.31]      Anticoagulation Episode Summary   INR check location    Preferred lab    Send INR reminders to ANTICOAG IMP   Comments     Anticoagulation Care Providers   Provider Role Specialty Phone number   Milta Deiters, MD  Internal Medicine [phone]      PATIENT INSTRUCTIONS: Patient Instructions  Patient instructed to take medications as defined in the Anti-coagulation Track section of this encounter.  Patient instructed to take today's dose.  Patient verbalized understanding of these instructions.       FOLLOW-UP Return in 3 weeks (on 03/22/2013) for Follow up INR at 2:15PM.  Jorene Guest, III Pharm.D., CACP

## 2013-03-19 ENCOUNTER — Ambulatory Visit (INDEPENDENT_AMBULATORY_CARE_PROVIDER_SITE_OTHER): Payer: Medicare PPO | Admitting: Internal Medicine

## 2013-03-19 ENCOUNTER — Telehealth: Payer: Self-pay | Admitting: *Deleted

## 2013-03-19 ENCOUNTER — Encounter: Payer: Self-pay | Admitting: Internal Medicine

## 2013-03-19 VITALS — BP 128/52 | HR 59 | Temp 97.0°F | Ht 63.0 in | Wt 267.7 lb

## 2013-03-19 DIAGNOSIS — Z7901 Long term (current) use of anticoagulants: Secondary | ICD-10-CM

## 2013-03-19 DIAGNOSIS — R040 Epistaxis: Secondary | ICD-10-CM

## 2013-03-19 DIAGNOSIS — I4891 Unspecified atrial fibrillation: Secondary | ICD-10-CM

## 2013-03-19 LAB — CBC WITH DIFFERENTIAL/PLATELET
BASOS ABS: 0 10*3/uL (ref 0.0–0.1)
BASOS PCT: 0 % (ref 0–1)
EOS ABS: 0.2 10*3/uL (ref 0.0–0.7)
EOS PCT: 3 % (ref 0–5)
HCT: 33.3 % — ABNORMAL LOW (ref 36.0–46.0)
Hemoglobin: 9.2 g/dL — ABNORMAL LOW (ref 12.0–15.0)
Lymphocytes Relative: 23 % (ref 12–46)
Lymphs Abs: 1.6 10*3/uL (ref 0.7–4.0)
MCH: 22 pg — ABNORMAL LOW (ref 26.0–34.0)
MCHC: 27.6 g/dL — AB (ref 30.0–36.0)
MCV: 79.7 fL (ref 78.0–100.0)
Monocytes Absolute: 0.3 10*3/uL (ref 0.1–1.0)
Monocytes Relative: 4 % (ref 3–12)
Neutro Abs: 5 10*3/uL (ref 1.7–7.7)
Neutrophils Relative %: 70 % (ref 43–77)
PLATELETS: 247 10*3/uL (ref 150–400)
RBC: 4.18 MIL/uL (ref 3.87–5.11)
RDW: 18.2 % — AB (ref 11.5–15.5)
WBC: 7.2 10*3/uL (ref 4.0–10.5)

## 2013-03-19 LAB — POCT INR: INR: 3.5

## 2013-03-19 MED ORDER — OXYMETAZOLINE HCL 0.05 % NA SOLN: 1.0000 | NASAL | Status: AC | PRN

## 2013-03-19 NOTE — Progress Notes (Signed)
Subjective:   Patient ID: [name] female    DOB: [birth year] 80 y.o.    MRN: [id number]  ____________________________________  HPI: [name] is a 80 y.o. female here for an acute visit for 2 weeks of nose bleeds.  Pt has a PMH outlined below.  Please see problem-based assessment and plan for further details of medical issues addressed at today's visit.   PMH: Past Medical History  Diagnosis Date  . Abdominal pain     Likely secondary to presbyesophagus and gastric dysmotility  . Chronic diastolic heart failure     June 2014 echo with EF 55-60% without wall motion abd but mod to severe AS  . Respiratory failure, chronic     Mixed etiology with bronchospastic component  . Urinary incontinence     Recurrent uti's/resistance to cipro, bactrim  . Gout   . Postablative hypothyroidism     H/o Graves disease s/p radioactive iodine ablation with resultant postablative hypothyroidism  . Hyperlipidemia   . Depression   . Morbidly obese     s/p gastric plication surgery  . Gastroesophageal reflux disease   . Ventral hernia     Repair in April [id number], complicated by MRSA abdominal wall cellulitis  . DVT (deep venous thrombosis) 1998  . Pulmonary embolism 1998    [place]field filter  . Diabetes mellitus type 2, controlled, with complications   . Essential hypertension, benign   . AF (atrial fibrillation)     on chronic warfarin  . Osteoarthritis   . Stroke 1997    Denies residual  . Anemia     Blood transfusion [V58.2]  . Supratherapeutic INR 05/15/2012    Possibly induced by steroid use.   . Chest pain at rest 08/07/2012  . Diabetes mellitus without complication   . Shortness of breath   . COPD (chronic obstructive pulmonary disease)   . Small bowel obstruction     intermittent  . CKD (chronic kidney disease), stage III   . Moderate aortic stenosis 07/2012    by ECHO with peak AV velocity 3cm/sec and mean AVG 23 mmhg  . Chronic diastolic CHF (congestive heart  failure)   . Coronary atherosclerosis of native coronary artery     30-40% stenosis of left circ    Medications:  (Not in a hospital admission)  Allergies: Allergies  Allergen Reactions  . Lantus [Insulin Glargine]     Causes a lot of itching  . Nitrofurantoin [Macrodantin] Itching    FH: Family History  Problem Relation Age of Onset  . Colon cancer Neg Hx     SH: History   Social History  . Marital Status: Widowed    Spouse Name: N/A    Number of Children: N/A  . Years of Education: N/A   Social History Main Topics  . Smoking status: Former Smoker -- 1.00 packs/day for 1.5 years    Types: Cigarettes  . Smokeless tobacco: Never Used  . Alcohol Use: No  . Drug Use: No  . Sexual Activity: None   Other Topics Concern  . None   Social History Narrative   She lives w/her son. Her son offers help and she has an aid who occasionally cooks for her and takes care of some other tasks around the pt's house.   Has medicare. Has son in [place] who is POA   Ambivalent about DNR issues and does not want a STOP sign posted in home   [facility] visits 6 days a  week             Review of Systems: Constitutional: Denies fever/chills, diaphoresis, unintentional weight loss, appetite change or fatigue.  Respiratory: Denies SOB (at rest or with exertion), cough, chest tightness, and wheezing.   Cardiovascular: Denies CP, palpitations and leg swelling.  Gastrointestinal: Denies N/V/D/C, abdominal pain, and blood/changes in stool. Genitourinary: Denies dysuria, urgency, frequency, hematuria, and difficulty urinating.  Endocrine: Denies: hot/cold intolerance, polyuria, polyphagia, polydipsia. Skin: Denies rashes or wounds.  Neurological: Denies dizziness/light-headedness, syncope, numbness and headaches.  Hematological: Denies adenopathy or easy bruising. Psychiatric/Behavioral: Denies mood changes, sleep disturbance.   Objective:   Vital Signs: Filed Vitals:   03/19/13  1411  BP: 128/52  Pulse: 59  Temp: 97 F (36.1 C)  Height: 5\' 3"  (1.6 m)  Weight: 267 lb 11.2 oz (121.428 kg)  SpO2: 90%      BP Readings from Last 3 Encounters:  03/19/13 128/52  02/16/13 120/80  02/08/13 138/70    Physical Exam: Constitutional: Vital signs reviewed.  Patient is well-developed and well-nourished in NAD and cooperative with exam sitting in a wheelchair with supplemental O2.  Head: Normocephalic and atraumatic. Eyes: PERRL, EOMI, conjunctivae nl, no scleral icterus.  Nose: specks of dried blood in the nasal cavity without active signs of bleeding; otherwise nl appearing; no polyps noted. Neck: Supple, Trachea midline, no JVD appreciated. Cardiovascular: RRR, no MRG, pulses symmetric and intact b/l. Pulmonary/Chest: normal respiratory effort, non-tender to palpation, CTAB, no wheezes, rales, or rhonchi. Abdominal: Obese. Soft. NT/ND +BS. Neurological: A&O x3, cranial nerves II-XII are grossly intact, moving all extremities. Extremities: 2+DP b/l; no pitting edema. Skin: Warm, dry and intact. No rash, cyanosis, or clubbing.  Psychiatric: Normal mood.   Most Recent Laboratory Results:  CMP     Component Value Date/Time   NA 139 02/16/2013 1407   K 4.3 02/16/2013 1407   CL 95* 02/16/2013 1407   CO2 33* 02/16/2013 1407   GLUCOSE 156* 02/16/2013 1407   BUN 39* 02/16/2013 1407   CREATININE 1.15* 02/16/2013 1407   CREATININE 1.14* 02/06/2013 1320   CALCIUM 9.9 02/16/2013 1407   CALCIUM 10.1 11/28/2011 1502   PROT 6.8 02/05/2013 0202   ALBUMIN 3.3* 02/05/2013 0202   AST 17 02/05/2013 0202   ALT 14 02/05/2013 0202   ALKPHOS 116 02/05/2013 0202   BILITOT 0.3 02/05/2013 0202   GFRNONAA 45* 02/06/2013 1320   GFRAA 52* 02/06/2013 1320    CBC    Component Value Date/Time   WBC 7.2 02/05/2013 0202   RBC 4.17 02/05/2013 0202   RBC 4.43 10/06/2008 0320   HGB 9.8* 02/05/2013 0202   HCT 33.7* 02/05/2013 0202   PLT 247 02/05/2013 0202   MCV 80.8 02/05/2013  0202   MCH 23.5* 02/05/2013 0202   MCHC 29.1* 02/05/2013 0202   RDW 18.3* 02/05/2013 0202   LYMPHSABS 1.8 02/05/2013 0202   MONOABS 0.5 02/05/2013 0202   EOSABS 0.2 02/05/2013 0202   BASOSABS 0.0 02/05/2013 0202    Lipid Panel Lab Results  Component Value Date   CHOL 99 08/20/2012   HDL 45 08/20/2012   LDLCALC 36 08/20/2012   TRIG 92 08/20/2012   CHOLHDL 2.2 08/20/2012    HA1C Lab Results  Component Value Date   HGBA1C 7.3 02/16/2013    Urinalysis    Component Value Date/Time   COLORURINE YELLOW 02/05/2013 0503   APPEARANCEUR CLEAR 02/05/2013 0503   LABSPEC 1.013 02/05/2013 0503   PHURINE 6.5 02/05/2013 0503   GLUCOSEU  NEGATIVE 02/05/2013 0503   HGBUR NEGATIVE 02/05/2013 0503   HGBUR moderate 03/09/2010 1032   BILIRUBINUR NEGATIVE 02/05/2013 0503   KETONESUR NEGATIVE 02/05/2013 0503   PROTEINUR NEGATIVE 02/05/2013 0503   UROBILINOGEN 0.2 02/05/2013 0503   NITRITE NEGATIVE 02/05/2013 0503   LEUKOCYTESUR MODERATE* 02/05/2013 0503    Urine Microalbumin Lab Results  Component Value Date   MICROALBUR 1.19 12/10/2011    Imaging N/A  Assessment & Plan:   Assessment and plan was discussed and formulated with my attending.

## 2013-03-19 NOTE — Patient Instructions (Addendum)
Thank you for your visit today. Please return to the internal medicine clinic next week with Dr. Eulas Post or sooner if needed.    Please decrease your coumadin dose on Friday evening to 2.5mg  (1/2 5mg  tablet).  Take 5mg  everyday of the week except Friday take 2.5mg .  Your INR was elevated today at 3.5. Please use afrin if you have nosebleeds.  Use only if needed- I have sent the prescription to your pharmacy.   We will see if your home oxygen company may be able to add some humidification to your oxygen.  If you believe that you are suffering from a life threatening condition or one that may result in the loss of limb or function, then you should call 911 or proceed to the nearest Emergency Department.

## 2013-03-19 NOTE — Telephone Encounter (Signed)
Pt's son calls and states pt has nosebleeds sporadically x 2 weeks. This am awoke with blood allover her bed. ems was called and evaluated, told son to see her md today appt made 1415 dr gill

## 2013-03-19 NOTE — Telephone Encounter (Signed)
agree

## 2013-03-20 ENCOUNTER — Other Ambulatory Visit: Payer: Self-pay | Admitting: Internal Medicine

## 2013-03-20 DIAGNOSIS — I5032 Chronic diastolic (congestive) heart failure: Secondary | ICD-10-CM

## 2013-03-20 DIAGNOSIS — E1140 Type 2 diabetes mellitus with diabetic neuropathy, unspecified: Secondary | ICD-10-CM

## 2013-03-21 DIAGNOSIS — R040 Epistaxis: Secondary | ICD-10-CM | POA: Insufficient documentation

## 2013-03-21 NOTE — Assessment & Plan Note (Addendum)
Pt comes in for an acute visit with 2 weeks of epistaxis.  She states it has happened everyday for the past 5 days and usually occurs once daily for about 10 minutes.  She states one episode drenched her nightshirt.  She applied pressure to the septum which stopped the bleeding.  She denies feeling lightheaded, SOB, or CP.  She is on coumadin for atrial fibrillation and last visit her INR was supratherapeutic (3.5) and dosing was adjusted.  She denies any trauma, nose-picking, URI symptoms, no FH of coagulopathy, or new nasal medication.  She does use home O2 which may be drying out her nasal cavity along with the dryer air that comes with the season.  She reports using a humidifier at home that should also help.  Lab Results  Component Value Date   INR 3.5 03/19/2013   INR 3.50 03/01/2013   INR 2.70 02/08/2013   -INR--3.5 today -check CBC -Afrin as needed for epistaxis   -humidify home O2 (will call [facility] to arrange) -saline nose spray as needed for dryness -decrease coumadin dose to 1/2 tablet today (take 2.5mg  on Friday, 03/19/13, then resume regular dose of 5mg  daily) -follow-up with Dr. Elie Confer on Monday, 03/22/13

## 2013-03-22 ENCOUNTER — Ambulatory Visit: Payer: Medicare PPO

## 2013-03-24 ENCOUNTER — Encounter: Payer: Self-pay | Admitting: Internal Medicine

## 2013-03-24 ENCOUNTER — Ambulatory Visit (INDEPENDENT_AMBULATORY_CARE_PROVIDER_SITE_OTHER): Payer: Medicare PPO | Admitting: Internal Medicine

## 2013-03-24 VITALS — BP 130/89 | HR 60 | Temp 97.9°F | Ht 63.0 in | Wt 267.9 lb

## 2013-03-24 DIAGNOSIS — E119 Type 2 diabetes mellitus without complications: Secondary | ICD-10-CM

## 2013-03-24 DIAGNOSIS — N390 Urinary tract infection, site not specified: Secondary | ICD-10-CM

## 2013-03-24 DIAGNOSIS — I10 Essential (primary) hypertension: Secondary | ICD-10-CM

## 2013-03-24 LAB — GLUCOSE, CAPILLARY: Glucose-Capillary: 128 mg/dL — ABNORMAL HIGH (ref 70–99)

## 2013-03-24 MED ORDER — FOSFOMYCIN TROMETHAMINE 3 G PO PACK: 3.0000 g | PACK | Freq: Once | ORAL | Status: DC

## 2013-03-24 NOTE — Addendum Note (Signed)
Addended by: Oval Linsey D on: 03/24/2013 06:01 PM   Modules accepted: Level of Service

## 2013-03-24 NOTE — Progress Notes (Signed)
Patient ID: [name], female   DOB: 04-12-1932, 80 y.o.   MRN: [id number]  Subjective:   Patient ID: [name] female   DOB: 01-05-1933 80 y.o.   MRN: [id number]  HPI: [name] is a 80 y.o. F w/ PMH dCHF, CKD III, COPD on home O2, HTN, HLD, DM (last A1c 6.7% 10/2012), GERD, prior PE, aortic stenosis, A fib on chronic coumadin, postablative hypothyroidism who presents for a 1 moth f/u for her diabetes.  She was seen by me on 12/15 and was only taking $RemoveBef'5mg'cWYHebFDnI$  of glipizide instead of $RemoveBe'10mg'mUfnNQTre$ . Her CBGs were fairly well controlled. She was to increase her glipizide to 1 whole tablet ($RemoveBefor'10mg'UwaSWrCRQLvd$  ) and return to the clinic in a month to see how her CBGs were and to make sure she was not having hypoglycemia. She is still taking 1/2 of a glipizide tablet. She is not checking her sugars daily but only intermittently.  +urinary incontinence with foul smelling urine since yesterday. She endorses lower mid abdominal pain at her bladder. Pt denies fevers or chills but thinks she has a UTI.    Past Medical History  Diagnosis Date  . Abdominal pain     Likely secondary to presbyesophagus and gastric dysmotility  . Chronic diastolic heart failure     June 2014 echo with EF 55-60% without wall motion abd but mod to severe AS  . Respiratory failure, chronic     Mixed etiology with bronchospastic component  . Urinary incontinence     Recurrent uti's/resistance to cipro, bactrim  . Gout   . Postablative hypothyroidism     H/o Graves disease s/p radioactive iodine ablation with resultant postablative hypothyroidism  . Hyperlipidemia   . Depression   . Morbidly obese     s/p gastric plication surgery  . Gastroesophageal reflux disease   . Ventral hernia     Repair in April 1027, complicated by MRSA abdominal wall cellulitis  . DVT (deep venous thrombosis) 1998  . Pulmonary embolism 1998    [place] filter  . Diabetes mellitus type 2, controlled, with complications   . Essential hypertension,  benign   . AF (atrial fibrillation)     on chronic warfarin  . Osteoarthritis   . Stroke 1997    Denies residual  . Anemia     Blood transfusion [V58.2]  . Supratherapeutic INR 05/15/2012    Possibly induced by steroid use.   . Chest pain at rest 08/07/2012  . Diabetes mellitus without complication   . Shortness of breath   . COPD (chronic obstructive pulmonary disease)   . Small bowel obstruction     intermittent  . CKD (chronic kidney disease), stage III   . Moderate aortic stenosis 07/2012    by ECHO with peak AV velocity 3cm/sec and mean AVG 23 mmhg  . Chronic diastolic CHF (congestive heart failure)   . Coronary atherosclerosis of native coronary artery     30-40% stenosis of left circ   Current Outpatient Prescriptions  Medication Sig Dispense Refill  . acetaminophen (TYLENOL) 500 MG tablet Take 1,000 mg by mouth 2 (two) times daily as needed. For pain.      Marland Kitchen albuterol (PROVENTIL HFA;VENTOLIN HFA) 108 (90 BASE) MCG/ACT inhaler Inhale 2 puffs into the lungs every 6 (six) hours as needed for wheezing or shortness of breath.  1 Inhaler  2  . allopurinol (ZYLOPRIM) 100 MG tablet TAKE ONE TABLET BY MOUTH TWICE DAILY  60 tablet  2  .  aspirin EC 81 MG EC tablet Take 1 tablet (81 mg total) by mouth daily.      Marland Kitchen atorvastatin (LIPITOR) 40 MG tablet Take 40 mg by mouth daily.      . Blood Glucose Monitoring Suppl (ONE TOUCH ULTRA MINI) W/DEVICE KIT Check blood sugar 3x a day dx code 250.02  100 each  12  . FLUoxetine (PROZAC) 20 MG capsule Take 1 capsule (20 mg total) by mouth 2 (two) times daily.  180 capsule  1  . Fluticasone-Salmeterol (ADVAIR) 250-50 MCG/DOSE AEPB Inhale 1 puff into the lungs 2 (two) times daily.      . furosemide (LASIX) 80 MG tablet Take 1 tablet (80 mg total) by mouth 2 (two) times daily.  180 tablet  3  . gabapentin (NEURONTIN) 100 MG capsule Take 2 capsules (200 mg total) by mouth 2 (two) times daily.  120 capsule  11  . glucose blood (ONE TOUCH ULTRA TEST) test  strip Check blood sugar 3x a day dx code 250.00  100 each  12  . levothyroxine (SYNTHROID) 150 MCG tablet Take 1 tablet (150 mcg total) by mouth daily.  30 tablet  11  . omeprazole (PRILOSEC) 20 MG capsule TAKE TWO CAPSULES BY MOUTH ONCE DAILY  60 capsule  2  . ONETOUCH DELICA LANCETS FINE MISC 1 each by Does not apply route 3 (three) times daily. Dx code 250.00  100 each  12  . oxymetazoline (AFRIN NASAL SPRAY) 0.05 % nasal spray Place 1 spray into both nostrils as needed. Use only if you have a nose bleed.  30 mL  0  . polyethylene glycol powder (GLYCOLAX/MIRALAX) powder Take 17 g by mouth daily as needed (for constipation).  527 g  5  . tiotropium (SPIRIVA) 18 MCG inhalation capsule Place 18 mcg into inhaler and inhale daily.      Marland Kitchen warfarin (COUMADIN) 5 MG tablet Take 1 tablet (5 mg total) by mouth daily at 6 PM. Except on Mondays take 1.5 tablets (7.5 mg)  32 tablet  1  . colchicine 0.6 MG tablet Take 0.6 mg by mouth See admin instructions. Take for 3 days when gout flares up      . COMBIVENT RESPIMAT 20-100 MCG/ACT AERS respimat INHALE TWO PUFFS EVERY 6 HOURS AS NEEDED FOR WHEEZING FOR SHORTNESS OF BREATH  4 g  0  . glipiZIDE (GLUCOTROL) 10 MG tablet TAKE ONE TABLET BY MOUTH TWICE DAILY BEFORE A MEALS  60 tablet  0  . loperamide (IMODIUM) 2 MG capsule Take 1 capsule (2 mg total) by mouth as needed for diarrhea or loose stools (up to total $Remove'16mg'YvKBEFX$  daily).  8 capsule  0   No current facility-administered medications for this visit.   Family History  Problem Relation Age of Onset  . Colon cancer Neg Hx    History   Social History  . Marital Status: Widowed    Spouse Name: N/A    Number of Children: N/A  . Years of Education: N/A   Social History Main Topics  . Smoking status: Former Smoker -- 1.00 packs/day for 1.5 years    Types: Cigarettes  . Smokeless tobacco: Never Used  . Alcohol Use: No  . Drug Use: No  . Sexual Activity: None   Other Topics Concern  . None   Social  History Narrative   She lives w/her son. Her son offers help and she has an aid who occasionally cooks for her and takes care of some other tasks  around the pt's house.   Has medicare. Has son in [place] who is POA   Ambivalent about DNR issues and does not want a STOP sign posted in home   [facility] visits 6 days a week            Review of Systems: A 12 point ROS was performed; pertinent positives and negatives were noted in the HPI   Objective:  Physical Exam: Filed Vitals:   03/24/13 1416  BP: 130/89  Pulse: 60  Temp: 97.9 F (36.6 C)  TempSrc: Oral  Height: $Remove'5\' 3"'JAXFzAM$  (1.6 m)  Weight: 267 lb 14.4 oz (121.519 kg)  SpO2: 90%   Constitutional: Vital signs reviewed.  Patient is a morbidly obese female who appears more lethargic today but is alert.  Head: Normocephalic and atraumatic Eyes: PERRL, EOMI Cardiovascular: RRR, no MRG Pulmonary/Chest: Normal respiratory effort, CTAB, no wheezes, rales, or rhonchi Abdominal: Soft. Non-tender, non-distended GU: NoCVA tenderness Musculoskeletal: moves all 4 extremities Neurological: A&O x3, no focal neurological deficits  Psychiatric: Appears more lethargic today. Speech normal. Answers questions appropriately  Assessment & Plan:   Please refer to Problem List based Assessment and Plan

## 2013-03-24 NOTE — Patient Instructions (Addendum)
Take the Fosfomycin for one dose. If your are still having urinary incontinence or foul smelling urine or pain with urination by Friday, please call the clinic.   Please call the clinic or go to the ER if you develop chills, fevers, or changes in mental status or become more fatigued.  Be sure to take 1 tablets (10mg ) of the Glipizide 2 times a day for your diabetes.   Fosfomycin powder for oral solution What is this medicine? FOSFOMYCIN (fos foe MYE sin) is an antibiotic. It is used to treat bacterial infections of the urinary tract. This medicine may be used for other purposes; ask your health care provider or pharmacist if you have questions. COMMON BRAND NAME(S): Monurol What should I tell my health care provider before I take this medicine? They need to know if you have any of these conditions: -kidney disease -an unusual or allergic reaction to fosfomycin, other medicines, foods, dyes, or preservatives -pregnant or trying to get pregnant -breast-feeding How should I use this medicine? Take this medicine by mouth. Follow the directions on the prescription label. Mix the contents of package in 3 to 4 ounces (1/2 cup) of cold water, stir well and drink. Take this medicine with food or on an empty stomach. Do not take your medicine more often than directed. Talk to your pediatrician regarding the use of this medicine in children. Special care may be needed. Overdosage: If you think you have taken too much of this medicine contact a poison control center or emergency room at once. NOTE: This medicine is only for you. Do not share this medicine with others. What if I miss a dose? This does not apply. This medicine is taken as a one-time dose. What may interact with this medicine? -metoclopramide This list may not describe all possible interactions. Give your health care provider a list of all the medicines, herbs, non-prescription drugs, or dietary supplements you use. Also tell them if  you smoke, drink alcohol, or use illegal drugs. Some items may interact with your medicine. What should I watch for while using this medicine? Tell your doctor or health care professional if your symptoms do not start to get better in 2 to 3 days or if they get worse. What side effects may I notice from receiving this medicine? Side effects that you should report to your doctor or health care professional as soon as possible: -allergic reactions like skin rash, itching or hives, swelling of the face, lips, or tongue -breathing problems -changes in vision -fever, flu-like symptoms -unusually weak or tired -yellowing of eyes or skin Side effects that usually do not require medical attention (report to your doctor or health care professional if they continue or are bothersome): -diarrhea -dizziness -headache -runny nose -sore throat -stomach upset, nausea -vaginal itch or irritation This list may not describe all possible side effects. Call your doctor for medical advice about side effects. You may report side effects to FDA at 1-800-FDA-1088. Where should I keep my medicine? Keep out of the reach of children. Store at room temperature between 15 and 30 degrees C (59 and 86 degrees F). Keep container tightly closed. Throw away any unused medicine after the expiration date. NOTE: This sheet is a summary. It may not cover all possible information. If you have questions about this medicine, talk to your doctor, pharmacist, or health care provider.  2014, Elsevier/Gold Standard. (2007-06-17 13:38:28)

## 2013-03-24 NOTE — Progress Notes (Signed)
Case discussed with Dr. Glenn at the time of the visit. We reviewed the resident's history and exam and pertinent patient test results. I agree with the assessment, diagnosis and plan of care documented in the resident's note. 

## 2013-03-24 NOTE — Assessment & Plan Note (Signed)
Lab Results  Component Value Date   HGBA1C 7.3 02/16/2013   HGBA1C 6.7 11/10/2012   HGBA1C 6.4 06/26/2012     Assessment: Diabetes control: fair control Progress toward A1C goal:  unchanged Comments: Pt reports only taking 5mg  BID instead of 10mg  BID of glipizide. She has only checked her CBGs 4 x since 1/19.   Plan: Medications:  continue current medications Home glucose monitoring: Frequency:   Timing:   Instruction/counseling given: reminded to bring blood glucose meter & log to each visit and reminded to bring medications to each visit Educational resources provided: handout Self management tools provided: copy of home glucose meter download Other plans: CBG today is 128 but looking at her glucometer readings, these have been elevated, but pt possibly with UTI. She it to take 1 tablet (10mg ) of her glipizide 2 times a day.

## 2013-03-24 NOTE — Assessment & Plan Note (Signed)
BP Readings from Last 3 Encounters:  03/24/13 130/89  03/19/13 128/52  02/16/13 120/80    Lab Results  Component Value Date   NA 139 02/16/2013   K 4.3 02/16/2013   CREATININE 1.15* 02/16/2013    Assessment: Blood pressure control: controlled Progress toward BP goal:  at goal  Plan: Medications:  continue current medications Educational resources provided: handout Self management tools provided:

## 2013-03-24 NOTE — Assessment & Plan Note (Addendum)
Patient with typical symptoms for UTI. She does not have CVA tenderness or fever or chills, so this is likely not pyelonephritis. She is hemodynamically stable. No signs for sepsis. Urine dipstick with blood and leukocytes. Sending urine for UA with reflex.  - Will treat her with Fosfomycin x1 dose in water for an uncomplicated UTI, as this does not interact with Coumadin.  - Checking UA and culture  - Pt instructed to call the clinic or go to the ED if she develops fevers, chills, or altered mental status

## 2013-03-25 ENCOUNTER — Encounter: Payer: Medicare PPO | Admitting: Internal Medicine

## 2013-03-25 LAB — URINALYSIS, ROUTINE W REFLEX MICROSCOPIC
BILIRUBIN URINE: NEGATIVE
Glucose, UA: NEGATIVE mg/dL
Ketones, ur: NEGATIVE mg/dL
Nitrite: NEGATIVE
PROTEIN: NEGATIVE mg/dL
Specific Gravity, Urine: 1.007 (ref 1.005–1.030)
UROBILINOGEN UA: 0.2 mg/dL (ref 0.0–1.0)
pH: 7 (ref 5.0–8.0)

## 2013-03-25 LAB — URINALYSIS, MICROSCOPIC ONLY
Bacteria, UA: NONE SEEN
Casts: NONE SEEN
Crystals: NONE SEEN

## 2013-03-29 NOTE — Progress Notes (Signed)
Case discussed with Dr. Gill soon after the resident saw the patient.  We reviewed the resident's history and exam and pertinent patient test results.  I agree with the assessment, diagnosis, and plan of care documented in the resident's note. 

## 2013-03-31 ENCOUNTER — Encounter (HOSPITAL_COMMUNITY): Payer: Self-pay | Admitting: Emergency Medicine

## 2013-03-31 ENCOUNTER — Inpatient Hospital Stay (HOSPITAL_COMMUNITY)
Admission: EM | Admit: 2013-03-31 | Discharge: 2013-04-03 | DRG: 292 | Disposition: A | Discharge status: Home Health | Payer: Medicare PPO | Attending: Infectious Disease | Admitting: Infectious Disease

## 2013-03-31 ENCOUNTER — Emergency Department (HOSPITAL_COMMUNITY): Payer: Medicare PPO

## 2013-03-31 DIAGNOSIS — M1990 Unspecified osteoarthritis, unspecified site: Secondary | ICD-10-CM | POA: Diagnosis present

## 2013-03-31 DIAGNOSIS — I509 Heart failure, unspecified: Secondary | ICD-10-CM | POA: Diagnosis present

## 2013-03-31 DIAGNOSIS — J9610 Chronic respiratory failure, unspecified whether with hypoxia or hypercapnia: Secondary | ICD-10-CM | POA: Diagnosis present

## 2013-03-31 DIAGNOSIS — I4891 Unspecified atrial fibrillation: Secondary | ICD-10-CM | POA: Diagnosis present

## 2013-03-31 DIAGNOSIS — Z888 Allergy status to other drugs, medicaments and biological substances status: Secondary | ICD-10-CM

## 2013-03-31 DIAGNOSIS — N183 Chronic kidney disease, stage 3 unspecified: Secondary | ICD-10-CM | POA: Diagnosis present

## 2013-03-31 DIAGNOSIS — M1A00X1 Idiopathic chronic gout, unspecified site, with tophus (tophi): Secondary | ICD-10-CM | POA: Diagnosis present

## 2013-03-31 DIAGNOSIS — Z9089 Acquired absence of other organs: Secondary | ICD-10-CM

## 2013-03-31 DIAGNOSIS — I5032 Chronic diastolic (congestive) heart failure: Secondary | ICD-10-CM

## 2013-03-31 DIAGNOSIS — J4489 Other specified chronic obstructive pulmonary disease: Secondary | ICD-10-CM | POA: Diagnosis present

## 2013-03-31 DIAGNOSIS — E890 Postprocedural hypothyroidism: Secondary | ICD-10-CM | POA: Diagnosis present

## 2013-03-31 DIAGNOSIS — Z8673 Personal history of transient ischemic attack (TIA), and cerebral infarction without residual deficits: Secondary | ICD-10-CM

## 2013-03-31 DIAGNOSIS — I495 Sick sinus syndrome: Secondary | ICD-10-CM | POA: Diagnosis present

## 2013-03-31 DIAGNOSIS — J449 Chronic obstructive pulmonary disease, unspecified: Secondary | ICD-10-CM | POA: Diagnosis present

## 2013-03-31 DIAGNOSIS — E119 Type 2 diabetes mellitus without complications: Secondary | ICD-10-CM | POA: Diagnosis present

## 2013-03-31 DIAGNOSIS — Z9884 Bariatric surgery status: Secondary | ICD-10-CM

## 2013-03-31 DIAGNOSIS — D649 Anemia, unspecified: Secondary | ICD-10-CM | POA: Diagnosis present

## 2013-03-31 DIAGNOSIS — F3289 Other specified depressive episodes: Secondary | ICD-10-CM

## 2013-03-31 DIAGNOSIS — Z87891 Personal history of nicotine dependence: Secondary | ICD-10-CM

## 2013-03-31 DIAGNOSIS — E1142 Type 2 diabetes mellitus with diabetic polyneuropathy: Secondary | ICD-10-CM

## 2013-03-31 DIAGNOSIS — Z86718 Personal history of other venous thrombosis and embolism: Secondary | ICD-10-CM

## 2013-03-31 DIAGNOSIS — E785 Hyperlipidemia, unspecified: Secondary | ICD-10-CM | POA: Diagnosis present

## 2013-03-31 DIAGNOSIS — I10 Essential (primary) hypertension: Secondary | ICD-10-CM | POA: Diagnosis present

## 2013-03-31 DIAGNOSIS — R040 Epistaxis: Secondary | ICD-10-CM

## 2013-03-31 DIAGNOSIS — M109 Gout, unspecified: Secondary | ICD-10-CM | POA: Diagnosis present

## 2013-03-31 DIAGNOSIS — I5031 Acute diastolic (congestive) heart failure: Secondary | ICD-10-CM

## 2013-03-31 DIAGNOSIS — E039 Hypothyroidism, unspecified: Secondary | ICD-10-CM | POA: Diagnosis present

## 2013-03-31 DIAGNOSIS — Z86711 Personal history of pulmonary embolism: Secondary | ICD-10-CM

## 2013-03-31 DIAGNOSIS — F329 Major depressive disorder, single episode, unspecified: Secondary | ICD-10-CM

## 2013-03-31 DIAGNOSIS — I359 Nonrheumatic aortic valve disorder, unspecified: Secondary | ICD-10-CM | POA: Diagnosis present

## 2013-03-31 DIAGNOSIS — I129 Hypertensive chronic kidney disease with stage 1 through stage 4 chronic kidney disease, or unspecified chronic kidney disease: Secondary | ICD-10-CM | POA: Diagnosis present

## 2013-03-31 DIAGNOSIS — I5023 Acute on chronic systolic (congestive) heart failure: Principal | ICD-10-CM | POA: Diagnosis present

## 2013-03-31 DIAGNOSIS — Z9981 Dependence on supplemental oxygen: Secondary | ICD-10-CM

## 2013-03-31 DIAGNOSIS — I350 Nonrheumatic aortic (valve) stenosis: Secondary | ICD-10-CM | POA: Diagnosis present

## 2013-03-31 DIAGNOSIS — I2510 Atherosclerotic heart disease of native coronary artery without angina pectoris: Secondary | ICD-10-CM | POA: Diagnosis present

## 2013-03-31 DIAGNOSIS — Z7901 Long term (current) use of anticoagulants: Secondary | ICD-10-CM

## 2013-03-31 DIAGNOSIS — K219 Gastro-esophageal reflux disease without esophagitis: Secondary | ICD-10-CM | POA: Diagnosis present

## 2013-03-31 DIAGNOSIS — Z6841 Body Mass Index (BMI) 40.0 and over, adult: Secondary | ICD-10-CM

## 2013-03-31 DIAGNOSIS — J441 Chronic obstructive pulmonary disease with (acute) exacerbation: Secondary | ICD-10-CM | POA: Diagnosis present

## 2013-03-31 DIAGNOSIS — E662 Morbid (severe) obesity with alveolar hypoventilation: Secondary | ICD-10-CM | POA: Diagnosis present

## 2013-03-31 DIAGNOSIS — Z7982 Long term (current) use of aspirin: Secondary | ICD-10-CM

## 2013-03-31 HISTORY — DX: Unspecified chronic bronchitis: J42

## 2013-03-31 HISTORY — DX: Thyrotoxicosis with diffuse goiter without thyrotoxic crisis or storm: E05.00

## 2013-03-31 HISTORY — DX: Personal history of peptic ulcer disease: Z87.11

## 2013-03-31 HISTORY — DX: Unspecified macular degeneration: H35.30

## 2013-03-31 HISTORY — DX: Unspecified convulsions: R56.9

## 2013-03-31 HISTORY — DX: Urinary tract infection, site not specified: N39.0

## 2013-03-31 HISTORY — DX: Personal history of other diseases of the digestive system: Z87.19

## 2013-03-31 HISTORY — DX: Dependence on supplemental oxygen: Z99.81

## 2013-03-31 HISTORY — DX: Type 2 diabetes mellitus without complications: E11.9

## 2013-03-31 HISTORY — DX: Pneumonia, unspecified organism: J18.9

## 2013-03-31 HISTORY — DX: Personal history of other medical treatment: Z92.89

## 2013-03-31 HISTORY — DX: Unspecified asthma, uncomplicated: J45.909

## 2013-03-31 LAB — BASIC METABOLIC PANEL
BUN: 34 mg/dL — AB (ref 6–23)
CALCIUM: 10.1 mg/dL (ref 8.4–10.5)
CO2: 31 mEq/L (ref 19–32)
Chloride: 98 mEq/L (ref 96–112)
Creatinine, Ser: 1.15 mg/dL — ABNORMAL HIGH (ref 0.50–1.10)
GFR, EST AFRICAN AMERICAN: 51 mL/min — AB (ref >90)
GFR, EST NON AFRICAN AMERICAN: 44 mL/min — AB (ref >90)
Glucose, Bld: 163 mg/dL — ABNORMAL HIGH (ref 70–99)
POTASSIUM: 4.5 meq/L (ref 3.7–5.3)
Sodium: 141 mEq/L (ref 137–147)

## 2013-03-31 LAB — CBC
HCT: 34.8 % — ABNORMAL LOW (ref 36.0–46.0)
Hemoglobin: 10.2 g/dL — ABNORMAL LOW (ref 12.0–15.0)
MCH: 22.6 pg — ABNORMAL LOW (ref 26.0–34.0)
MCHC: 29.3 g/dL — ABNORMAL LOW (ref 30.0–36.0)
MCV: 77 fL — ABNORMAL LOW (ref 78.0–100.0)
Platelets: 232 10*3/uL (ref 150–400)
RBC: 4.52 MIL/uL (ref 3.87–5.11)
RDW: 18.2 % — AB (ref 11.5–15.5)
WBC: 7.2 10*3/uL (ref 4.0–10.5)

## 2013-03-31 LAB — PROTIME-INR
INR: 3.21 — ABNORMAL HIGH (ref 0.00–1.49)
Prothrombin Time: 31.7 seconds — ABNORMAL HIGH (ref 11.6–15.2)

## 2013-03-31 LAB — MRSA PCR SCREENING: MRSA by PCR: NEGATIVE

## 2013-03-31 LAB — POCT I-STAT TROPONIN I: Troponin i, poc: 0.03 ng/mL (ref 0.00–0.08)

## 2013-03-31 LAB — GLUCOSE, CAPILLARY: Glucose-Capillary: 127 mg/dL — ABNORMAL HIGH (ref 70–99)

## 2013-03-31 LAB — PRO B NATRIURETIC PEPTIDE: Pro B Natriuretic peptide (BNP): 5351 pg/mL — ABNORMAL HIGH (ref 0–450)

## 2013-03-31 LAB — URIC ACID: URIC ACID, SERUM: 7 mg/dL (ref 2.4–7.0)

## 2013-03-31 MED ORDER — FLUOXETINE HCL 20 MG PO CAPS
20.0000 mg | ORAL_CAPSULE | Freq: Two times a day (BID) | ORAL | Status: DC
  Administered 2013-03-31 – 2013-04-03 (×7): 20 mg via ORAL
  Filled 2013-03-31 (×8): qty 1

## 2013-03-31 MED ORDER — FUROSEMIDE 10 MG/ML IJ SOLN
80.0000 mg | Freq: Two times a day (BID) | INTRAMUSCULAR | Status: DC
Start: 2013-03-31 — End: 2013-04-02
  Administered 2013-03-31 – 2013-04-02 (×4): 80 mg via INTRAVENOUS
  Filled 2013-03-31 (×7): qty 8

## 2013-03-31 MED ORDER — ALLOPURINOL 100 MG PO TABS
100.0000 mg | ORAL_TABLET | Freq: Two times a day (BID) | ORAL | Status: DC
  Administered 2013-03-31 – 2013-04-01 (×3): 100 mg via ORAL
  Filled 2013-03-31 (×4): qty 1

## 2013-03-31 MED ORDER — ATORVASTATIN CALCIUM 40 MG PO TABS
40.0000 mg | ORAL_TABLET | Freq: Every day | ORAL | Status: DC
  Administered 2013-03-31 – 2013-04-03 (×4): 40 mg via ORAL
  Filled 2013-03-31 (×4): qty 1

## 2013-03-31 MED ORDER — SODIUM CHLORIDE 0.9 % IJ SOLN
3.0000 mL | Freq: Two times a day (BID) | INTRAMUSCULAR | Status: DC
  Administered 2013-04-01 – 2013-04-02 (×2): 3 mL via INTRAVENOUS

## 2013-03-31 MED ORDER — PANTOPRAZOLE SODIUM 40 MG PO TBEC
40.0000 mg | DELAYED_RELEASE_TABLET | Freq: Every day | ORAL | Status: DC
  Administered 2013-03-31 – 2013-04-03 (×4): 40 mg via ORAL
  Filled 2013-03-31 (×3): qty 1

## 2013-03-31 MED ORDER — ALBUTEROL SULFATE (2.5 MG/3ML) 0.083% IN NEBU: 2.5000 mg | INHALATION_SOLUTION | Freq: Four times a day (QID) | RESPIRATORY_TRACT | Status: DC | PRN

## 2013-03-31 MED ORDER — SODIUM CHLORIDE 0.9 % IJ SOLN: 3.0000 mL | INTRAMUSCULAR | Status: DC | PRN

## 2013-03-31 MED ORDER — WARFARIN SODIUM 4 MG PO TABS
4.0000 mg | ORAL_TABLET | Freq: Once | ORAL | Status: AC
  Administered 2013-03-31: 4 mg via ORAL
  Filled 2013-03-31 (×2): qty 1

## 2013-03-31 MED ORDER — MOMETASONE FURO-FORMOTEROL FUM 100-5 MCG/ACT IN AERO
2.0000 | INHALATION_SPRAY | Freq: Two times a day (BID) | RESPIRATORY_TRACT | Status: DC
  Administered 2013-03-31 – 2013-04-01 (×2): 2 via RESPIRATORY_TRACT
  Filled 2013-03-31 (×2): qty 8.8

## 2013-03-31 MED ORDER — SODIUM CHLORIDE 0.9 % IJ SOLN
3.0000 mL | Freq: Two times a day (BID) | INTRAMUSCULAR | Status: DC
  Administered 2013-03-31 – 2013-04-03 (×5): 3 mL via INTRAVENOUS

## 2013-03-31 MED ORDER — POLYETHYLENE GLYCOL 3350 17 GM/SCOOP PO POWD
17.0000 g | Freq: Every day | ORAL | Status: DC | PRN
  Filled 2013-03-31: qty 255

## 2013-03-31 MED ORDER — TIOTROPIUM BROMIDE MONOHYDRATE 18 MCG IN CAPS
18.0000 ug | ORAL_CAPSULE | Freq: Every day | RESPIRATORY_TRACT | Status: DC
  Administered 2013-04-01: 18 ug via RESPIRATORY_TRACT
  Filled 2013-03-31: qty 5

## 2013-03-31 MED ORDER — LEVOTHYROXINE SODIUM 150 MCG PO TABS
150.0000 ug | ORAL_TABLET | Freq: Every day | ORAL | Status: DC
  Administered 2013-03-31 – 2013-04-03 (×4): 150 ug via ORAL
  Filled 2013-03-31 (×5): qty 1

## 2013-03-31 MED ORDER — SODIUM CHLORIDE 0.9 % IV SOLN: 250.0000 mL | INTRAVENOUS | Status: DC | PRN

## 2013-03-31 MED ORDER — GABAPENTIN 100 MG PO CAPS
200.0000 mg | ORAL_CAPSULE | Freq: Two times a day (BID) | ORAL | Status: DC
  Administered 2013-03-31 – 2013-04-03 (×7): 200 mg via ORAL
  Filled 2013-03-31 (×8): qty 2

## 2013-03-31 MED ORDER — ACETAMINOPHEN 500 MG PO TABS
500.0000 mg | ORAL_TABLET | Freq: Two times a day (BID) | ORAL | Status: DC | PRN
  Administered 2013-04-02: 500 mg via ORAL
  Administered 2013-04-02: 1000 mg via ORAL
  Filled 2013-03-31: qty 1
  Filled 2013-03-31 (×2): qty 2

## 2013-03-31 MED ORDER — WARFARIN - PHARMACIST DOSING INPATIENT: Freq: Every day | Status: DC

## 2013-03-31 MED ORDER — POLYETHYLENE GLYCOL 3350 17 G PO PACK
17.0000 g | PACK | Freq: Every day | ORAL | Status: DC | PRN
  Administered 2013-03-31 – 2013-04-03 (×2): 17 g via ORAL
  Filled 2013-03-31 (×2): qty 1

## 2013-03-31 MED ORDER — INSULIN ASPART 100 UNIT/ML ~~LOC~~ SOLN
0.0000 [IU] | Freq: Three times a day (TID) | SUBCUTANEOUS | Status: DC
  Administered 2013-03-31 – 2013-04-01 (×2): 2 [IU] via SUBCUTANEOUS
  Administered 2013-04-01: 3 [IU] via SUBCUTANEOUS
  Administered 2013-04-01: 2 [IU] via SUBCUTANEOUS
  Administered 2013-04-02: 3 [IU] via SUBCUTANEOUS
  Administered 2013-04-02 – 2013-04-03 (×3): 5 [IU] via SUBCUTANEOUS
  Administered 2013-04-03: 3 [IU] via SUBCUTANEOUS

## 2013-03-31 MED ORDER — ASPIRIN EC 81 MG PO TBEC
81.0000 mg | DELAYED_RELEASE_TABLET | Freq: Every day | ORAL | Status: DC
  Administered 2013-03-31 – 2013-04-03 (×4): 81 mg via ORAL
  Filled 2013-03-31 (×4): qty 1

## 2013-03-31 MED ORDER — FUROSEMIDE 10 MG/ML IJ SOLN
80.0000 mg | Freq: Once | INTRAMUSCULAR | Status: AC
  Administered 2013-03-31: 80 mg via INTRAVENOUS
  Filled 2013-03-31: qty 8

## 2013-03-31 MED ORDER — ALBUTEROL SULFATE HFA 108 (90 BASE) MCG/ACT IN AERS: 2.0000 | INHALATION_SPRAY | Freq: Four times a day (QID) | RESPIRATORY_TRACT | Status: DC | PRN

## 2013-03-31 NOTE — Progress Notes (Signed)
ANTICOAGULATION CONSULT NOTE - Initial Consult  Pharmacy Consult for coumadin Indication: atrial fibrillation  Allergies  Allergen Reactions  . Copper-Containing Compounds Other (See Comments)    Per doctor told her not take meds containing copper  . Lantus [Insulin Glargine]     Causes a lot of itching  . Nitrofurantoin [Macrodantin] Itching    Patient Measurements: Height: 5\' 1"  (154.9 cm) Weight: 271 lb (122.925 kg) IBW/kg (Calculated) : 47.8   Vital Signs: Temp: 98.2 F (36.8 C) (02/04 0817) Temp src: Oral (02/04 0817) BP: 159/66 mmHg (02/04 1325) Pulse Rate: 54 (02/04 1325)  Labs:  Recent Labs  03/31/13 0858  HGB 10.2*  HCT 34.8*  PLT 232  LABPROT 31.7*  INR 3.21*  CREATININE 1.15*    Estimated Creatinine Clearance: 49.5 ml/min (by C-G formula based on Cr of 1.15).   Medical History: Past Medical History  Diagnosis Date  . Abdominal pain     Likely secondary to presbyesophagus and gastric dysmotility  . Chronic diastolic heart failure     June 2014 echo with EF 55-60% without wall motion abd but mod to severe AS  . Respiratory failure, chronic     Mixed etiology with bronchospastic component  . Urinary incontinence     Recurrent uti's/resistance to cipro, bactrim  . Gout   . Postablative hypothyroidism     H/o Graves disease s/p radioactive iodine ablation with resultant postablative hypothyroidism  . Hyperlipidemia   . Depression   . Morbidly obese     s/p gastric plication surgery  . Gastroesophageal reflux disease   . Ventral hernia     Repair in April 2202, complicated by MRSA abdominal wall cellulitis  . DVT (deep venous thrombosis) 1998  . Pulmonary embolism 1998    Greenfield filter  . Diabetes mellitus type 2, controlled, with complications   . Essential hypertension, benign   . AF (atrial fibrillation)     on chronic warfarin  . Osteoarthritis   . Stroke 1997    Denies residual  . Anemia     Blood transfusion [V58.2]  .  Supratherapeutic INR 05/15/2012    Possibly induced by steroid use.   . Chest pain at rest 08/07/2012  . Diabetes mellitus without complication   . Shortness of breath   . COPD (chronic obstructive pulmonary disease)   . Small bowel obstruction     intermittent  . CKD (chronic kidney disease), stage III   . Moderate aortic stenosis 07/2012    by ECHO with peak AV velocity 3cm/sec and mean AVG 23 mmhg  . Chronic diastolic CHF (congestive heart failure)   . Coronary atherosclerosis of native coronary artery     30-40% stenosis of left circ  Medications:  Coumadin 5 mg every day except 2.5 mg on Fridays. Last dose 03/30/13.  Assessment: 80 yo F on coumadin PTA for Afib.  Also PMH for PE, DVT, has Greenfield filter.  She was seen in IM clinic 03/19/13 for c/o 2 week hx of epistaxsis.  Her INR on 1/23 was 3.5 and her dose was decreased from 5 mg daily to 5 mg daily except 2.5 mg on Fridays.  Pt admitted for SOB, possibly CHF exacerbation.   INR today 3.21.  Hg 10.2, plct 232.   Goal of Therapy: INR 2-3    Plan:  1. Coumadin 4 mg po x 1 dose 2. Daily INR  Eudelia Bunch, Pharm.D. [phone]   03/31/2013 1:46 PM

## 2013-03-31 NOTE — H&P (Signed)
Date: 03/31/2013               Patient Name:  [name] MRN: [id number]  DOB: November 25, 1932 Age / Sex: 80 y.o., female   PCP: [name] Fireman, MD         Medical Service: Internal Medicine Teaching Service         Attending Physician: Dr. Tommy [name]    First Contact: Dr. Gordy [name] Pager: [phone]  Second Contact: Dr. Alice [name] Pager: [phone]       After Hours (After 5p/  First Contact Pager: [phone]  weekends / holidays): Second Contact Pager: [phone]   Chief Complaint: SOB  History of Present Illness:  [name] is a 80yo WF w/ PMH dCHF, CKD III, COPD on home O2, HTN, HLD, DM (last A1c 6.7% 10/2012), GERD, prior PE, moderate aortic stenosis, A fib on chronic coumadin, postablative hypothyroidism who presents w/ c/o nonproductive cough, wheezing and worsening SOB x 3 days. [name] has multiple admissions for acute CHF, exacerbated by her AS, most recently on 02/05/13.  She describes both DOE and SOB at rest, and describes increased LE swelling.  She denies chest pain and heart palpitations.  She reports rhinorrhea with sinus congestion, which she has reported on prior admissions as well.  No fevers/chills.  She reports compliance with her medications, but cannot recall the dose of lasix (initially she notes 40 bid).  She denies change in diet. She cannot check her weight at home.  She is usually on 2L O2, which she increased to 3L and then to 5L.  She was recently treated for a UTI (03/24/13) in clinic with fosfomycin x 1 which she completed, and denies urinary complaints.  She reports recent gout attack 6 days ago.  Review of Systems: Constitutional: Denies fever, chills, diaphoresis. Decreased appetite HEENT: Denies photophobia, eye pain, redness, hearing loss, ear pain, mouth sores, trouble swallowing, neck pain, neck stiffness and tinnitus.  Respiratory: per HPI Cardiovascular: per HPI  Gastrointestinal: Denies nausea, vomiting (had one episode of near post tussive emesis, but never  actually vomited), abdominal pain, diarrhea, constipation,blood in stool and abdominal distention.  Genitourinary: Denies dysuria, urgency, hematuria, flank pain and difficulty urinating.  Musculoskeletal: chronic arthritis  Skin: Denies pallor, rash and wound.  Neurological: Denies dizziness, seizures, syncope, weakness, lightheadedness, numbness, +slight HA.  Hematological: +nose bleeds x 5 days that resolved a few days ago   Meds: Current Outpatient Prescriptions  Medication Sig  . acetaminophen (TYLENOL) 500 MG tablet Take 500-1,000 mg by mouth 2 (two) times daily as needed (pain).   Marland Kitchen albuterol (PROVENTIL HFA;VENTOLIN HFA) 108 (90 BASE) MCG/ACT inhaler Inhale 2 puffs into the lungs every 6 (six) hours as needed for wheezing or shortness of breath.  . allopurinol (ZYLOPRIM) 100 MG tablet Take 100 mg by mouth 2 (two) times daily.  Marland Kitchen aspirin EC 81 MG EC tablet Take 1 tablet (81 mg total) by mouth daily.  Marland Kitchen atorvastatin (LIPITOR) 40 MG tablet Take 40 mg by mouth daily.  . colchicine 0.6 MG tablet Take 0.6 mg by mouth See admin instructions. Take for 3 days when gout flares up  . COMBIVENT RESPIMAT 20-100 MCG/ACT AERS respimat INHALE TWO PUFFS EVERY 6 HOURS AS NEEDED FOR WHEEZING FOR SHORTNESS OF BREATH  . FLUoxetine (PROZAC) 20 MG capsule Take 1 capsule (20 mg total) by mouth 2 (two) times daily.  . Fluticasone-Salmeterol (ADVAIR) 250-50 MCG/DOSE AEPB Inhale 1 puff into the lungs 2 (two) times daily.  . fosfomycin (MONUROL)  3 G PACK Take 3 g by mouth once.  . furosemide (LASIX) 80 MG tablet Take 1 tablet (80 mg total) by mouth 2 (two) times daily.  Marland Kitchen gabapentin (NEURONTIN) 100 MG capsule Take 2 capsules (200 mg total) by mouth 2 (two) times daily.  Marland Kitchen glipiZIDE (GLUCOTROL) 10 MG tablet TAKE ONE TABLET BY MOUTH TWICE DAILY BEFORE A MEALS  . glucose blood (ONE TOUCH ULTRA TEST) test strip Check blood sugar 3x a day dx code 250.00  . levothyroxine (SYNTHROID) 150 MCG tablet Take 1 tablet (150  mcg total) by mouth daily.  Marland Kitchen loperamide (IMODIUM) 2 MG capsule Take 1 capsule (2 mg total) by mouth as needed for diarrhea or loose stools (up to total 16mg  daily).  Marland Kitchen omeprazole (PRILOSEC) 20 MG capsule TAKE TWO CAPSULES BY MOUTH ONCE DAILY  . ONETOUCH DELICA LANCETS FINE MISC 1 each by Does not apply route 3 (three) times daily. Dx code 250.00  . oxymetazoline (AFRIN NASAL SPRAY) 0.05 % nasal spray Place 1 spray into both nostrils as needed. Use only if you have a nose bleed.  . polyethylene glycol powder (GLYCOLAX/MIRALAX) powder Take 17 g by mouth daily as needed (for constipation).  Marland Kitchen tiotropium (SPIRIVA) 18 MCG inhalation capsule Place 18 mcg into inhaler and inhale daily.  Marland Kitchen warfarin (COUMADIN) 5 MG tablet Take 1 tablet (5 mg total) by mouth daily at 6 PM. Except on Mondays take 1.5 tablets (7.5 mg)    Allergies: Allergies as of 03/31/2013 - Review Complete 03/31/2013  Allergen Reaction Noted  . Copper-containing compounds Other (See Comments) 03/31/2013  . Lantus [insulin glargine]  05/13/2012  . Nitrofurantoin [macrodantin] Itching 04/20/2010   Past Medical History  Diagnosis Date  . Abdominal pain     Likely secondary to presbyesophagus and gastric dysmotility  . Chronic diastolic heart failure     June 2014 echo with EF 55-60% without wall motion abd but mod to severe AS  . Respiratory failure, chronic     Mixed etiology with bronchospastic component  . Urinary incontinence     Recurrent uti's/resistance to cipro, bactrim  . Gout   . Postablative hypothyroidism     H/o Graves disease s/p radioactive iodine ablation with resultant postablative hypothyroidism  . Hyperlipidemia   . Depression   . Morbidly obese     s/p gastric plication surgery  . Gastroesophageal reflux disease   . Ventral hernia     Repair in April [id number], complicated by MRSA abdominal wall cellulitis  . DVT (deep venous thrombosis) 1998  . Pulmonary embolism 1998    Greenfield filter  . Diabetes  mellitus type 2, controlled, with complications   . Essential hypertension, benign   . AF (atrial fibrillation)     on chronic warfarin  . Osteoarthritis   . Stroke 1997    Denies residual  . Anemia     Blood transfusion [V58.2]  . Supratherapeutic INR 05/15/2012    Possibly induced by steroid use.   . Chest pain at rest 08/07/2012  . Diabetes mellitus without complication   . Shortness of breath   . COPD (chronic obstructive pulmonary disease)   . Small bowel obstruction     intermittent  . CKD (chronic kidney disease), stage III   . Moderate aortic stenosis 07/2012    by ECHO with peak AV velocity 3cm/sec and mean AVG 23 mmhg  . Chronic diastolic CHF (congestive heart failure)   . Coronary atherosclerosis of native coronary artery     30-40%  stenosis of left circ   Past Surgical History  Procedure Laterality Date  . Cholecystectomy    . Appendectomy    . Breast biopsy    . Gastric bypass  1970's  . Hernia repair  05/2006    ventral hernia  . Greenfield filter placement  1998  . Cardiac catheterization  2006    30-40% stenosis fo circumflex no internetions..no beta blocker due to bradycardia/   Family History  Problem Relation Age of Onset  . Colon cancer Neg Hx    History   Social History  . Marital Status: Widowed    Spouse Name: N/A    Number of Children: N/A  . Years of Education: N/A   Occupational History  . Not on file.   Social History Main Topics  . Smoking status: Former Smoker -- 1.00 packs/day for 1.5 years    Types: Cigarettes  . Smokeless tobacco: Never Used  . Alcohol Use: No  . Drug Use: No  . Sexual Activity: Not on file   Other Topics Concern  . Not on file   Social History Narrative   She lives w/her son. Her son offers help and she has an aid who occasionally cooks for her and takes care of some other tasks around the pt's house.   Has medicare. Has son in [place] who is POA   Ambivalent about DNR issues and does not want a STOP sign posted  in home   [facility] visits 6 days a week             Physical Exam: Blood pressure 142/85, pulse 52, temperature 98.2 F (36.8 C), temperature source Oral, resp. rate 17, last menstrual period 05/22/1968, SpO2 96.00%. on 4L Upon ED arrival: SpO2 94% on 2L General: pleasant ,appears as stated age, no acute distress HEENT: PERRL, EOMI, no scleral icterus Cardiac: irregular, bradycardic, +systolic murmur heard best at RUSB Pulm: bibasilar crackles, no wheezing on exam Abd: soft, nontender, nondistended, BS normoactive  Ext: warm and well perfused, trace pretibial pitting edema, right MTP with tophaceous gout Neuro: alert and oriented X3, cranial nerves II-XII grossly intact   Lab results: Basic Metabolic Panel:  Recent Labs  03/31/13 0858  NA 141  K 4.5  CL 98  CO2 31  GLUCOSE 163*  BUN 34*  CREATININE 1.15*  CALCIUM 10.1  AG : 12  CBC:  Recent Labs  03/31/13 0858  WBC 7.2  HGB 10.2*  HCT 34.8*  MCV 77.0*  PLT 232   BNP:  Recent Labs  03/31/13 0858  PROBNP 5351.0*   iStat Trop: 0.03  Coagulation:  Recent Labs  03/31/13 0858  LABPROT 31.7*  INR 3.21*   Imaging results:  Dg Chest Port 1 View  03/31/2013   CLINICAL DATA:  Short of breath.  Congestive heart failure.  EXAM: PORTABLE CHEST - 1 VIEW  COMPARISON:  02/05/2013  FINDINGS: Artifact overlies the chest. The patient has taken a poor inspiration. The heart is enlarged. There is calcification of the aorta up. There is interstitial and early alveolar pulmonary edema. No effusions. No acute bony finding. Chronic rotator cuff tears.  IMPRESSION: Cardiomegaly. Interstitial and early alveolar edema. Findings consistent with congestive heart failure.   Electronically Signed   By: Nelson Chimes M.D.   On: 03/31/2013 08:42    Other results: EKG: irregular, rate controlled, no change from prior   Assessment & Plan by Problem: Ms. Mcwright is a 80yo F with history of CHF, moderate-severe  AS (valve area  0.74cm^2), gout, afib on a/c, DM and chronic respiratory failure on 2L home O2 admitted on 03/31/13 with complaints of SOB with dry cough.   #Shortness of Breath: Patient's SOB is most likely secondary to heart failure exacerbation (mildly reduced EF of 55% on 09/05/12 without note of diastolic dysfunction, but prior admits have been noted to be HFPEF), given findings on CXR.  Weight today hasn't been done yet, and patient given lasix 80mg  IV x 1. pBNP elevated in setting of stable renal function (CKD 3).  Patient has subjective increased O2 requirement, but has not desaturated since arrival.  No wheezing on exam, do not suspect COPD exacerbation.   No s/s of pulmonary infection. INR therapuetic, do not suspect PE.  Not likely ACS given no chest pain, initial trop negative, no concerning EKG changes and sx began 3 days prior.  She chronically has a component of OHS. -Admit to tele (obs) -Lasix 80 IV BID (twice home dose, cautiously diurese given AS) -Low Na diet -Daily weights  -AM BMET -Cont home inhalers & O2 support to keep SpO2>92%  #Gout: of right MTP, improving -Continue allopurinol, has already treated with colchicine x 3 d  -check uric acid (frequent attacks), goal <6  #COPD: Stable. At home, chronically on 2L home O2; Clinical presentation not typical of COPD exacerbation.  -Continue O2  -albuterol prn  -Substitute Dulera for Advair (formulary)  -continue home spirava inhaler   #CKD, stage III: Stable (GFR 44). Baseline creatinine is ~1.0 - Lasix as above  - follow renal function carefully  #Atrial Fibrillation: H/o sick sinus syndrome with recurrent bradycardia and intolerance to beta blockers. Asymptomatic. On chronic anticoagulation with therapeutic INR.  -Coumadin per pharmacy   #Normocytic Anemia: Baseline Hb seems to be around 10 to 11. Hb 10.2 at admission.  -clinically monitor, repeat CBC if indicated  #HTN: Stable. Careful not to lower preload significantly given  AS -Lasix as above   #Hypothyroidism: TSH 4.144 (wnl) on 06/26/12.  -Continue home Levothyroxine 150 mcg daily   #DM type 2, controlled: A1c 7.3 on 02/16/13. At home on glipizide 5mg  dialy. The patient is allergic to Lantus.  -Hold home glipizide  -SSI-sensitive  -Continue Gabapentin   #Depression: Stable.  -Continue Prozac   #VTE ppx: Anticoagulation (coumadin)   #Code Status: Full code   Dispo: Disposition is deferred at this time, awaiting improvement of current medical problems. Anticipated discharge in approximately 2-3 day(s).   The patient does have a current PCP ([name] Fireman, MD) and does need an [facility] hospital follow-up appointment after discharge.  The patient does not have transportation limitations that hinder transportation to clinic appointments.  Signed: Othella Boyer, MD 03/31/2013, 10:02 AM

## 2013-03-31 NOTE — ED Provider Notes (Signed)
CSN: [id number]     Arrival date & time 03/31/13  0813 History   First MD Initiated Contact with Patient 03/31/13 [phone]     Chief Complaint  Patient presents with  . Shortness of Breath   Pt seen with medical student, I performed history/physical/documentation    Patient is a 80 y.o. female presenting with shortness of breath. The history is provided by the patient.  Shortness of Breath Severity:  Moderate Onset quality:  Gradual Timing:  Intermittent Progression:  Worsening Chronicity:  Recurrent Relieved by:  Nothing Worsened by:  Nothing tried Associated symptoms: cough and vomiting   Associated symptoms: no abdominal pain, no chest pain, no fever and no hemoptysis   Pt reports for past 3 days she has had increasing SOB and LE edema No CP She vomited once yesterday She reports the SOB is worse "all the time" and does not improve with position   Past Medical History  Diagnosis Date  . Abdominal pain     Likely secondary to presbyesophagus and gastric dysmotility  . Chronic diastolic heart failure     June 2014 echo with EF 55-60% without wall motion abd but mod to severe AS  . Respiratory failure, chronic     Mixed etiology with bronchospastic component  . Urinary incontinence     Recurrent uti's/resistance to cipro, bactrim  . Gout   . Postablative hypothyroidism     H/o Graves disease s/p radioactive iodine ablation with resultant postablative hypothyroidism  . Hyperlipidemia   . Depression   . Morbidly obese     s/p gastric plication surgery  . Gastroesophageal reflux disease   . Ventral hernia     Repair in April [id number], complicated by MRSA abdominal wall cellulitis  . DVT (deep venous thrombosis) 1998  . Pulmonary embolism 1998    Greenfield filter  . Diabetes mellitus type 2, controlled, with complications   . Essential hypertension, benign   . AF (atrial fibrillation)     on chronic warfarin  . Osteoarthritis   . Stroke 1997    Denies residual  . Anemia      Blood transfusion [V58.2]  . Supratherapeutic INR 05/15/2012    Possibly induced by steroid use.   . Chest pain at rest 08/07/2012  . Diabetes mellitus without complication   . Shortness of breath   . COPD (chronic obstructive pulmonary disease)   . Small bowel obstruction     intermittent  . CKD (chronic kidney disease), stage III   . Moderate aortic stenosis 07/2012    by ECHO with peak AV velocity 3cm/sec and mean AVG 23 mmhg  . Chronic diastolic CHF (congestive heart failure)   . Coronary atherosclerosis of native coronary artery     30-40% stenosis of left circ   Past Surgical History  Procedure Laterality Date  . Cholecystectomy    . Appendectomy    . Breast biopsy    . Gastric bypass  1970's  . Hernia repair  05/2006    ventral hernia  . Greenfield filter placement  1998  . Cardiac catheterization  2006    30-40% stenosis fo circumflex no internetions..no beta blocker due to bradycardia/   Family History  Problem Relation Age of Onset  . Colon cancer Neg Hx    History  Substance Use Topics  . Smoking status: Former Smoker -- 1.00 packs/day for 1.5 years    Types: Cigarettes  . Smokeless tobacco: Never Used  . Alcohol Use: No   OB History  Grav Para Term Preterm Abortions TAB SAB Ect Mult Living                 Review of Systems  Constitutional: Negative for fever.  Respiratory: Positive for cough and shortness of breath. Negative for hemoptysis.   Cardiovascular: Positive for leg swelling. Negative for chest pain.  Gastrointestinal: Positive for vomiting. Negative for abdominal pain.  Neurological: Positive for weakness.  All other systems reviewed and are negative.    Allergies  Copper-containing compounds; Lantus; and Nitrofurantoin  Home Medications   Current Outpatient Rx  Name  Route  Sig  Dispense  Refill  . acetaminophen (TYLENOL) 500 MG tablet   Oral   Take 500-1,000 mg by mouth 2 (two) times daily as needed (pain).          Marland Kitchen  albuterol (PROVENTIL HFA;VENTOLIN HFA) 108 (90 BASE) MCG/ACT inhaler   Inhalation   Inhale 2 puffs into the lungs every 6 (six) hours as needed for wheezing or shortness of breath.   1 Inhaler   2   . allopurinol (ZYLOPRIM) 100 MG tablet   Oral   Take 100 mg by mouth 2 (two) times daily.         Marland Kitchen aspirin EC 81 MG EC tablet   Oral   Take 1 tablet (81 mg total) by mouth daily.         Marland Kitchen atorvastatin (LIPITOR) 40 MG tablet   Oral   Take 40 mg by mouth daily.         . colchicine 0.6 MG tablet   Oral   Take 0.6 mg by mouth See admin instructions. Take for 3 days when gout flares up         . COMBIVENT RESPIMAT 20-100 MCG/ACT AERS respimat      INHALE TWO PUFFS EVERY 6 HOURS AS NEEDED FOR WHEEZING FOR SHORTNESS OF BREATH   4 g   0   . FLUoxetine (PROZAC) 20 MG capsule   Oral   Take 1 capsule (20 mg total) by mouth 2 (two) times daily.   180 capsule   1   . Fluticasone-Salmeterol (ADVAIR) 250-50 MCG/DOSE AEPB   Inhalation   Inhale 1 puff into the lungs 2 (two) times daily.         . fosfomycin (MONUROL) 3 G PACK   Oral   Take 3 g by mouth once.   1 packet   0   . furosemide (LASIX) 80 MG tablet   Oral   Take 1 tablet (80 mg total) by mouth 2 (two) times daily.   180 tablet   3   . gabapentin (NEURONTIN) 100 MG capsule   Oral   Take 2 capsules (200 mg total) by mouth 2 (two) times daily.   120 capsule   11   . glipiZIDE (GLUCOTROL) 10 MG tablet      TAKE ONE TABLET BY MOUTH TWICE DAILY BEFORE A MEALS   60 tablet   0   . glucose blood (ONE TOUCH ULTRA TEST) test strip      Check blood sugar 3x a day dx code 250.00   100 each   12   . levothyroxine (SYNTHROID) 150 MCG tablet   Oral   Take 1 tablet (150 mcg total) by mouth daily.   30 tablet   11   . loperamide (IMODIUM) 2 MG capsule   Oral   Take 1 capsule (2 mg total) by mouth as needed for diarrhea or  loose stools (up to total 16mg  daily).   8 capsule   0   . omeprazole (PRILOSEC) 20  MG capsule      TAKE TWO CAPSULES BY MOUTH ONCE DAILY   60 capsule   2   . ONETOUCH DELICA LANCETS FINE MISC   Does not apply   1 each by Does not apply route 3 (three) times daily. Dx code 250.00   100 each   12   . oxymetazoline (AFRIN NASAL SPRAY) 0.05 % nasal spray   Each Nare   Place 1 spray into both nostrils as needed. Use only if you have a nose bleed.   30 mL   0   . polyethylene glycol powder (GLYCOLAX/MIRALAX) powder   Oral   Take 17 g by mouth daily as needed (for constipation).   527 g   5   . tiotropium (SPIRIVA) 18 MCG inhalation capsule   Inhalation   Place 18 mcg into inhaler and inhale daily.         Marland Kitchen warfarin (COUMADIN) 5 MG tablet   Oral   Take 1 tablet (5 mg total) by mouth daily at 6 PM. Except on Mondays take 1.5 tablets (7.5 mg)   32 tablet   1    BP 143/77  Pulse 57  Temp(Src) 98.2 F (36.8 C) (Oral)  Resp 14  SpO2 98%  LMP 05/22/1968 Physical Exam CONSTITUTIONAL: elderly, chronically ill appearing HEAD: Normocephalic/atraumatic EYES: EOMI/PERRL ENMT: Mucous membranes moist NECK: supple no meningeal signs SPINE:entire spine nontender CV: irregular, normal rate LUNGS:mild tachypnea noted, crackles noted in bilateral bases ABDOMEN: soft, nontender, no rebound or guarding, obese.  Healing wound to central abdomen, no drainage/bleeding/crepitance noted GU:no cva tenderness NEURO: Pt is awake/alert, moves all extremitiesx4 EXTREMITIES: pulses normal, full ROM, minimal pitting edema bilateral LE SKIN: warm, color normal PSYCH: no abnormalities of mood noted  ED Course  Procedures (including critical care time) Labs Review Labs Reviewed  CBC  BASIC METABOLIC PANEL  PROTIME-INR  PRO B NATRIURETIC PEPTIDE   Imaging Review No results found.  EKG Interpretation    Date/Time:  Wednesday March 31 2013 08:20:33 EST Ventricular Rate:  56 PR Interval:    QRS Duration: 153 QT Interval:  528 QTC Calculation: 510 R  Axis:   -109 Text Interpretation:  Atrial fibrillation Nonspecific IVCD with LAD Probable anterolateral infarct, age indeterm Confirmed by Christy Gentles  MD, Juline Sanderford ([id number]) on 03/31/2013 8:25:12 AM          10:02 AM Pt noted to be in CHF D/w internal medicine, will admit Lasix ordered  MDM  No diagnosis found. Nursing notes including past medical history and social history reviewed and considered in documentation xrays reviewed and considered Labs/vital reviewed and considered Previous records reviewed and considered - h/o afib/chf per records Pt uses oxygen at home on a daily basis     Sharyon Cable, MD 03/31/13 1002

## 2013-03-31 NOTE — ED Notes (Signed)
Per EMS- pt reports increasing SOB for 3 day. Non productive cough. Pt reports increase swelling in lower legs as well. Denies pain.

## 2013-04-01 DIAGNOSIS — D649 Anemia, unspecified: Secondary | ICD-10-CM

## 2013-04-01 DIAGNOSIS — M109 Gout, unspecified: Secondary | ICD-10-CM

## 2013-04-01 DIAGNOSIS — F329 Major depressive disorder, single episode, unspecified: Secondary | ICD-10-CM

## 2013-04-01 DIAGNOSIS — F3289 Other specified depressive episodes: Secondary | ICD-10-CM

## 2013-04-01 DIAGNOSIS — I4891 Unspecified atrial fibrillation: Secondary | ICD-10-CM

## 2013-04-01 DIAGNOSIS — J4489 Other specified chronic obstructive pulmonary disease: Secondary | ICD-10-CM

## 2013-04-01 DIAGNOSIS — E119 Type 2 diabetes mellitus without complications: Secondary | ICD-10-CM

## 2013-04-01 DIAGNOSIS — E039 Hypothyroidism, unspecified: Secondary | ICD-10-CM

## 2013-04-01 DIAGNOSIS — N183 Chronic kidney disease, stage 3 unspecified: Secondary | ICD-10-CM

## 2013-04-01 DIAGNOSIS — J449 Chronic obstructive pulmonary disease, unspecified: Secondary | ICD-10-CM

## 2013-04-01 DIAGNOSIS — I129 Hypertensive chronic kidney disease with stage 1 through stage 4 chronic kidney disease, or unspecified chronic kidney disease: Secondary | ICD-10-CM

## 2013-04-01 LAB — BASIC METABOLIC PANEL
BUN: 35 mg/dL — AB (ref 6–23)
CALCIUM: 9.8 mg/dL (ref 8.4–10.5)
CO2: 32 mEq/L (ref 19–32)
Chloride: 96 mEq/L (ref 96–112)
Creatinine, Ser: 1.18 mg/dL — ABNORMAL HIGH (ref 0.50–1.10)
GFR calc non Af Amer: 43 mL/min — ABNORMAL LOW (ref >90)
GFR, EST AFRICAN AMERICAN: 50 mL/min — AB (ref >90)
Glucose, Bld: 164 mg/dL — ABNORMAL HIGH (ref 70–99)
POTASSIUM: 4.4 meq/L (ref 3.7–5.3)
SODIUM: 138 meq/L (ref 137–147)

## 2013-04-01 LAB — GLUCOSE, CAPILLARY
GLUCOSE-CAPILLARY: 160 mg/dL — AB (ref 70–99)
GLUCOSE-CAPILLARY: 149 mg/dL — AB (ref 70–99)
GLUCOSE-CAPILLARY: 172 mg/dL — AB (ref 70–99)
GLUCOSE-CAPILLARY: 281 mg/dL — AB (ref 70–99)
Glucose-Capillary: 142 mg/dL — ABNORMAL HIGH (ref 70–99)

## 2013-04-01 LAB — PROTIME-INR
INR: 3.11 — ABNORMAL HIGH (ref 0.00–1.49)
Prothrombin Time: 30.9 seconds — ABNORMAL HIGH (ref 11.6–15.2)

## 2013-04-01 MED ORDER — DIPHENHYDRAMINE HCL 25 MG PO CAPS
25.0000 mg | ORAL_CAPSULE | Freq: Once | ORAL | Status: AC
  Administered 2013-04-02: 25 mg via ORAL
  Filled 2013-04-01: qty 1

## 2013-04-01 MED ORDER — ALBUTEROL SULFATE (2.5 MG/3ML) 0.083% IN NEBU: 2.5000 mg | INHALATION_SOLUTION | Freq: Four times a day (QID) | RESPIRATORY_TRACT | Status: DC

## 2013-04-01 MED ORDER — PREDNISONE 20 MG PO TABS
40.0000 mg | ORAL_TABLET | Freq: Every day | ORAL | Status: DC
Start: 2013-04-01 — End: 2013-04-01
  Administered 2013-04-01: 40 mg via ORAL
  Filled 2013-04-01 (×2): qty 2

## 2013-04-01 MED ORDER — WARFARIN SODIUM 2 MG PO TABS
2.0000 mg | ORAL_TABLET | Freq: Once | ORAL | Status: AC
  Administered 2013-04-01: 2 mg via ORAL
  Filled 2013-04-01: qty 1

## 2013-04-01 MED ORDER — PREDNISONE 20 MG PO TABS
40.0000 mg | ORAL_TABLET | Freq: Every day | ORAL | Status: DC
  Administered 2013-04-02 – 2013-04-03 (×2): 40 mg via ORAL
  Filled 2013-04-01 (×3): qty 2

## 2013-04-01 MED ORDER — ALLOPURINOL 300 MG PO TABS
300.0000 mg | ORAL_TABLET | Freq: Every day | ORAL | Status: DC
  Administered 2013-04-02 – 2013-04-03 (×2): 300 mg via ORAL
  Filled 2013-04-01 (×2): qty 1

## 2013-04-01 MED ORDER — IPRATROPIUM-ALBUTEROL 0.5-2.5 (3) MG/3ML IN SOLN
3.0000 mL | Freq: Four times a day (QID) | RESPIRATORY_TRACT | Status: DC
Start: 2013-04-01 — End: 2013-04-03
  Administered 2013-04-01 – 2013-04-03 (×9): 3 mL via RESPIRATORY_TRACT
  Filled 2013-04-01 (×9): qty 3

## 2013-04-01 NOTE — Discharge Instructions (Addendum)
Thank you for allowing [place] to be involved in your healthcare while you were hospitalized at [facility].   Please note that there have been changes to your home medications.  --> PLEASE LOOK AT YOUR DISCHARGE MEDICATION LIST FOR DETAILS.  Please be sure to have your INR on Monday.  Please call your PCP if you have any questions or concerns, or any difficulty getting any of your medications.  Please return to the ER if you have worsening of your symptoms or new severe symptoms arise.    Information on my medicine - Coumadin   (Warfarin) Why was Coumadin prescribed for you? Coumadin was prescribed for you because you have a blood clot or a medical condition that can cause an increased risk of forming blood clots. Blood clots can cause serious health problems by blocking the flow of blood to the heart, lung, or brain. Coumadin can prevent harmful blood clots from forming. As a reminder your indication for Coumadin is:   Stroke Prevention Because Of Atrial Fibrillation  What test will check on my response to Coumadin? While on Coumadin (warfarin) you will need to have an INR test regularly to ensure that your dose is keeping you in the desired range. The INR (international normalized ratio) number is calculated from the result of the laboratory test called prothrombin time (PT).  If an INR APPOINTMENT HAS NOT ALREADY BEEN MADE FOR YOU please schedule an appointment to have this lab work done by your health care provider within 7 days. Your INR goal is usually a number between: 2 to 3.   What  do you need to  know  About  COUMADIN? Take Coumadin (warfarin) exactly as prescribed by your healthcare provider about the same time each day.  DO NOT stop taking without talking to the doctor who prescribed the medication.  Stopping without other blood clot prevention medication to take the place of Coumadin may increase your risk of developing a new clot or stroke.  Get refills before  you run out.  What do you do if you miss a dose? If you miss a dose, take it as soon as you remember on the same day then continue your regularly scheduled regimen the next day.  Do not take two doses of Coumadin at the same time.  Important Safety Information A possible side effect of Coumadin (Warfarin) is an increased risk of bleeding. You should call your healthcare provider right away if you experience any of the following:   Bleeding from an injury or your nose that does not stop.   Unusual colored urine (red or dark brown) or unusual colored stools (red or black).   Unusual bruising for unknown reasons.   A serious fall or if you hit your head (even if there is no bleeding).  Some foods or medicines interact with Coumadin (warfarin) and might alter your response to warfarin. To help avoid this:   Eat a balanced diet, maintaining a consistent amount of Vitamin K.   Notify your provider about major diet changes you plan to make.   Avoid alcohol or limit your intake to 1 drink for women and 2 drinks for men per day. (1 drink is 5 oz. wine, 12 oz. beer, or 1.5 oz. liquor.)  Make sure that ANY health care provider who prescribes medication for you knows that you are taking Coumadin (warfarin).  Also make sure the healthcare provider who is monitoring your Coumadin knows when you have started a  new medication including herbals and non-prescription products.  Coumadin (Warfarin)  Major Drug Interactions  Increased Warfarin Effect Decreased Warfarin Effect  Alcohol (large quantities) Antibiotics (esp. Septra/Bactrim, Flagyl, Cipro) Amiodarone (Cordarone) Aspirin (ASA) Cimetidine (Tagamet) Megestrol (Megace) NSAIDs (ibuprofen, naproxen, etc.) Piroxicam (Feldene) Propafenone (Rythmol SR) Propranolol (Inderal) Isoniazid (INH) Posaconazole (Noxafil) Barbiturates (Phenobarbital) Carbamazepine (Tegretol) Chlordiazepoxide (Librium) Cholestyramine (Questran) Griseofulvin Oral  Contraceptives Rifampin Sucralfate (Carafate) Vitamin K   Coumadin (Warfarin) Major Herbal Interactions  Increased Warfarin Effect Decreased Warfarin Effect  Garlic Ginseng Ginkgo biloba Coenzyme Q10 Green tea St. Johns wort    Coumadin (Warfarin) FOOD Interactions  Eat a consistent number of servings per week of foods HIGH in Vitamin K (1 serving =  cup)  Collards (cooked, or boiled & drained) Kale (cooked, or boiled & drained) Mustard greens (cooked, or boiled & drained) Parsley *serving size only =  cup Spinach (cooked, or boiled & drained) Swiss chard (cooked, or boiled & drained) Turnip greens (cooked, or boiled & drained)  Eat a consistent number of servings per week of foods MEDIUM-HIGH in Vitamin K (1 serving = 1 cup)  Asparagus (cooked, or boiled & drained) Broccoli (cooked, boiled & drained, or raw & chopped) Brussel sprouts (cooked, or boiled & drained) *serving size only =  cup Lettuce, raw (green leaf, endive, romaine) Spinach, raw Turnip greens, raw & chopped   These websites have more information on Coumadin (warfarin):  FailFactory.se; VeganReport.com.au;   Home Health  Services arranged with [place]. [phone]. Registered Nurse and Physical Therapy   2 Gram Low Sodium Diet A 2 gram sodium diet restricts the amount of sodium in the diet to no more than 2 g or 2000 mg daily. Limiting the amount of sodium is often used to help lower blood pressure. It is important if you have heart, liver, or kidney problems. Many foods contain sodium for flavor and sometimes as a preservative. When the amount of sodium in a diet needs to be low, it is important to know what to look for when choosing foods and drinks. The following includes some information and guidelines to help make it easier for you to adapt to a low sodium diet. QUICK TIPS  Do not add salt to food.  Avoid convenience items and fast food.  Choose unsalted snack  foods.  Buy lower sodium products, often labeled as "lower sodium" or "no salt added."  Check food labels to learn how much sodium is in 1 serving.  When eating at a restaurant, ask that your food be prepared with less salt or none, if possible. READING FOOD LABELS FOR SODIUM INFORMATION The nutrition facts label is a good place to find how much sodium is in foods. Look for products with no more than 500 to 600 mg of sodium per meal and no more than 150 mg per serving. Remember that 2 g = 2000 mg. The food label may also list foods as:  Sodium-free: Less than 5 mg in a serving.  Very low sodium: 35 mg or less in a serving.  Low-sodium: 140 mg or less in a serving.  Light in sodium: 50% less sodium in a serving. For example, if a food that usually has 300 mg of sodium is changed to become light in sodium, it will have 150 mg of sodium.  Reduced sodium: 25% less sodium in a serving. For example, if a food that usually has 400 mg of sodium is changed to reduced sodium, it will have 300 mg of sodium.  CHOOSING FOODS Grains  Avoid: Salted crackers and snack items. Some cereals, including instant hot cereals. Bread stuffing and biscuit mixes. Seasoned rice or pasta mixes.  Choose: Unsalted snack items. Low-sodium cereals, oats, puffed wheat and rice, shredded wheat. English muffins and bread. Pasta. Meats  Avoid: Salted, canned, smoked, spiced, pickled meats, including fish and poultry. Bacon, ham, sausage, cold cuts, hot dogs, anchovies.  Choose: Low-sodium canned tuna and salmon. Fresh or frozen meat, poultry, and fish. Dairy  Avoid: Processed cheese and spreads. Cottage cheese. Buttermilk and condensed milk. Regular cheese.  Choose: Milk. Low-sodium cottage cheese. Yogurt. Sour cream. Low-sodium cheese. Fruits and Vegetables  Avoid: Regular canned vegetables. Regular canned tomato sauce and paste. Frozen vegetables in sauces. Olives. Angie Fava. Relishes. Sauerkraut.  Choose:  Low-sodium canned vegetables. Low-sodium tomato sauce and paste. Frozen or fresh vegetables. Fresh and frozen fruit. Condiments  Avoid: Canned and packaged gravies. Worcestershire sauce. Tartar sauce. Barbecue sauce. Soy sauce. Steak sauce. Ketchup. Onion, garlic, and table salt. Meat flavorings and tenderizers.  Choose: Fresh and dried herbs and spices. Low-sodium varieties of mustard and ketchup. Lemon juice. Tabasco sauce. Horseradish. SAMPLE 2 GRAM SODIUM MEAL PLAN Breakfast / Sodium (mg)  1 cup low-fat milk / 638 mg  2 slices whole-wheat toast / 270 mg  1 tbs heart-healthy margarine / 153 mg  1 hard-boiled egg / 139 mg  1 small orange / 0 mg Lunch / Sodium (mg)  1 cup raw carrots / 76 mg   cup hummus / 298 mg  1 cup low-fat milk / 143 mg   cup red grapes / 2 mg  1 whole-wheat pita bread / 356 mg Dinner / Sodium (mg)  1 cup whole-wheat pasta / 2 mg  1 cup low-sodium tomato sauce / 73 mg  3 oz lean ground beef / 57 mg  1 small side salad (1 cup raw spinach leaves,  cup cucumber,  cup yellow bell pepper) with 1 tsp olive oil and 1 tsp red wine vinegar / 25 mg Snack / Sodium (mg)  1 container low-fat vanilla yogurt / 107 mg  3 graham cracker squares / 127 mg Nutrient Analysis  Calories: 2033  Protein: 77 g  Carbohydrate: 282 g  Fat: 72 g  Sodium: 1971 mg Document Released: 02/11/2005 Document Revised: 05/06/2011 Document Reviewed: 05/15/2009 ExitCare Patient Information 2014 [place].

## 2013-04-01 NOTE — H&P (Signed)
  Date: 04/01/2013  Patient name: [name]  Medical record number: [id number]  Date of birth: [birth year]   I have seen and evaluated [name] and discussed their care with the Residency Team.   Assessment and Plan: I have seen and evaluated the patient as outlined above. I agree with the formulated Assessment and Plan as detailed in the residents' admission note, with the following changes:   80 year old lady with multiple medical problems including CHF, artery 2 severe aortic stenosis, atrial fibrillation COPD, chronic home oxygen sounds with worsening wheezing coughing and shortness of breath and feeling severely fatigued x3 days. She is admitted last night found to have an elevated pro BNP with evidence of pulmonary edema on chest x-ray. She was given Lasix 80 mg intravenously times one and started on bronchodilator therapy. Overnight she feels slightly better although still feels "pooped"  On exam she has diffuse wheezes throughout her lung fields. She without any productive cough at this point time. She has minimal pedal edema.   I agree with continuing diuresis, bronchodilators and I think a short course of corticosteroids is not unreasoanable as well  HIV was negative in Nov 2014    Truman Hayward, [place] 2/5/201511:34 AM

## 2013-04-01 NOTE — Discharge Summary (Signed)
Name: [name] MRN: [id number] DOB: Jul 17, 1932 80 y.o. PCP: Madilyn Fireman, MD . Date of Admission: 03/31/2013  8:13 AM Date of Discharge: 04/03/2013 Attending Physician: Truman Hayward, MD  Discharge Diagnosis: Principal Problem:   Acute on chronic systolic CHF (congestive heart failure) Active Problems:   HYPOTHYROIDISM   DIABETES MELLITUS   HYPERLIPIDEMIA   GOUT   OBESITY, MORBID   HYPERTENSION   Atrial fibrillation   COPD   OSTEOARTHRITIS   Aortic stenosis   Coronary atherosclerosis of native coronary artery   Acute on chronic systolic heart failure  Discharge Medications:   Medication List         acetaminophen 500 MG tablet  Commonly known as:  TYLENOL  Take 500-1,000 mg by mouth 2 (two) times daily as needed (pain).     albuterol 108 (90 BASE) MCG/ACT inhaler  Commonly known as:  PROVENTIL HFA;VENTOLIN HFA  Inhale 2 puffs into the lungs every 6 (six) hours as needed for wheezing or shortness of breath.     allopurinol 100 MG tablet  Commonly known as:  ZYLOPRIM  Take 100 mg by mouth 2 (two) times daily.     aspirin 81 MG EC tablet  Take 1 tablet (81 mg total) by mouth daily.     atorvastatin 40 MG tablet  Commonly known as:  LIPITOR  Take 40 mg by mouth daily.     colchicine 0.6 MG tablet  Take 0.6 mg by mouth See admin instructions. Take for 3 days when gout flares up     diphenhydrAMINE 25 MG tablet  Commonly known as:  BENADRYL  Take 50 mg by mouth 3 (three) times daily.     FLUoxetine 20 MG capsule  Commonly known as:  PROZAC  Take 1 capsule (20 mg total) by mouth 2 (two) times daily.     Fluticasone-Salmeterol 250-50 MCG/DOSE Aepb  Commonly known as:  ADVAIR  Inhale 1 puff into the lungs 2 (two) times daily.     furosemide 80 MG tablet  Commonly known as:  LASIX  Take 1 tablet (80 mg total) by mouth 2 (two) times daily.     gabapentin 100 MG capsule  Commonly known as:  NEURONTIN  Take 2 capsules (200 mg total) by mouth 2  (two) times daily.     glipiZIDE 10 MG tablet  Commonly known as:  GLUCOTROL  Take 10 mg by mouth 2 (two) times daily before a meal.     glucose blood test strip  Commonly known as:  ONE TOUCH ULTRA TEST  Check blood sugar 3x a day dx code 250.00     levothyroxine 150 MCG tablet  Commonly known as:  SYNTHROID  Take 1 tablet (150 mcg total) by mouth daily.     loperamide 2 MG capsule  Commonly known as:  IMODIUM  Take 1 capsule (2 mg total) by mouth as needed for diarrhea or loose stools (up to total 16mg  daily).     omeprazole 20 MG capsule  Commonly known as:  PRILOSEC  Take 40 mg by mouth daily.     ONETOUCH DELICA LANCETS FINE Misc  1 each by Does not apply route 3 (three) times daily. Dx code 250.00     oxymetazoline 0.05 % nasal spray  Commonly known as:  AFRIN NASAL SPRAY  Place 1 spray into both nostrils as needed. Use only if you have a nose bleed.     polyethylene glycol packet  Commonly known as:  MIRALAX / GLYCOLAX  Take 17 g by mouth every morning.     predniSONE 20 MG tablet  Commonly known as:  DELTASONE  Take 2 tablets (40 mg total) by mouth daily with breakfast.     tiotropium 18 MCG inhalation capsule  Commonly known as:  SPIRIVA  Place 18 mcg into inhaler and inhale daily.     warfarin 5 MG tablet  Commonly known as:  COUMADIN  Take 2.5-5 mg by mouth daily. Take 1 tablet daily except take 0.5 tablet on friday        Disposition and follow-up:   [name] was discharged from [facility] in Stable condition.  Please address the following problems:  1.  Patient is at high risk for readmission.  She has been admitted 3 times in the past months.  I consulted [facility] and she ultimately agreed to services.  Please contact Marthenia Rolling of [facility] Liaison at [phone] to ensure services have been established.  Also please address breathing and if pt is still SOB in addition to compliance with meds.  She should be  on 300mg  allopurinol daily (uric acid was ~7 during hospitalization and it was increased from 200 to 300mg  daily, however, her medication list below was not completed by me and still has the lower dose).  2.  Labs / imaging needed at time of follow-up: PT/INR, BMP  3.  Pending labs/ test needing follow-up: None  Follow-up Appointments: Follow-up Information   Follow up with [place] In 2 days. (please call clinic for appointment. )    Contact information:   [address] [place] [address] [phone]      Follow up with [place]. (home health physical therapy and nurse)    Contact information:   [address] [place] [address] [phone]       Follow up with Caryl Bis, [facility] On 04/05/2013. (clinic will contact you for appointment )    Specialty:  Pharmacist   Contact information:   [place] [address] [phone]       Discharge Instructions: Discharge Orders   Future Appointments Provider Department Dept Phone   04/13/2013 10:00 AM Na Nicoletta Dress, MD [place] [phone]   04/28/2013 2:30 PM Harriet Masson, [place] at [place]   05/10/2013 2:30 PM Hayden Pedro, MD [place] [phone]   05/11/2013 3:00 PM Sueanne Margarita, MD [facility] [phone]   06/01/2013 10:15 AM Madilyn Fireman, MD [place] [phone]   Future Orders Complete By Expires   Call MD for:  difficulty breathing, headache or visual disturbances  As directed    Call MD for:  persistant nausea and vomiting  As directed    Call MD for:  redness, tenderness, or signs of infection (pain, swelling, redness, odor or green/yellow discharge around incision site)  As directed    Call MD for:  severe uncontrolled pain  As directed    Call MD for:  temperature >100.4  As directed    Diet - low sodium heart  healthy  As directed    Increase activity slowly  As directed       Consultations:  None  Procedures Performed:  Dg Chest Port 1 View  03/31/2013   CLINICAL DATA:  Short of breath.  Congestive heart failure.  EXAM:  PORTABLE CHEST - 1 VIEW  COMPARISON:  02/05/2013  FINDINGS: Artifact overlies the chest. The patient has taken a poor inspiration. The heart is enlarged. There is calcification of the aorta up. There is interstitial and early alveolar pulmonary edema. No effusions. No acute bony finding. Chronic rotator cuff tears.  IMPRESSION: Cardiomegaly. Interstitial and early alveolar edema. Findings consistent with congestive heart failure.   Electronically Signed   By: Nelson Chimes M.D.   On: 03/31/2013 08:42    2D Echo: N/A  Cardiac Cath: N/A  Admission HPI: [name] is a 80yo WF w/ PMH dCHF, CKD III, COPD on home O2, HTN, HLD, DM (last A1c 6.7% 10/2012), GERD, prior PE, moderate aortic stenosis, A fib on chronic coumadin, postablative hypothyroidism who presents w/ c/o nonproductive cough, wheezing and worsening SOB x 3 days. [name] has multiple admissions for acute CHF, exacerbated by her AS, most recently on 02/05/13. She describes both DOE and SOB at rest, and describes increased LE swelling. She denies chest pain and heart palpitations. She reports rhinorrhea with sinus congestion, which she has reported on prior admissions as well. No fevers/chills. She reports compliance with her medications, but cannot recall the dose of lasix (initially she notes 40 bid). She denies change in diet. She cannot check her weight at home. She is usually on 2L O2, which she increased to 3L and then to 5L. She was recently treated for a UTI (03/24/13) in clinic with fosfomycin x 1 which she completed, and denies urinary complaints. She reports recent gout attack 6 days ago.   Hospital Course by problem list: Principal Problem:   Acute on chronic systolic CHF (congestive heart failure) Active  Problems:   HYPOTHYROIDISM   DIABETES MELLITUS   HYPERLIPIDEMIA   GOUT   OBESITY, MORBID   HYPERTENSION   Atrial fibrillation   COPD   OSTEOARTHRITIS   Aortic stenosis   Coronary atherosclerosis of native coronary artery   Acute on chronic systolic heart failure   #Shortness of Breath: Patient's SOB is most likely secondary to heart failure exacerbation (mildly reduced EF of 55% on 09/05/12 without note of diastolic dysfunction, but prior admits have been noted to be HFPEF), given findings on CXR. Weight today hasn't been done yet, and patient given lasix 80mg  IV x 1. pBNP elevated in setting of stable renal function (CKD 3). Patient has subjective increased O2 requirement, but has not desaturated since arrival. No wheezing on exam, do not suspect COPD exacerbation. No s/s of pulmonary infection. INR therapuetic, do not suspect PE. Not likely ACS given no chest pain, initial trop negative, no concerning EKG changes and sx began 3 days prior. She chronically has a component of OHS.  She was admitted to telemetry bed and given lasix 80mg  IV bid, in addition to a low sodium diet, daily weights, and duonebs.    #Gout: of right MTP, improving.  Uric acid was elevated at 7 and therefore allopurinol was increased from 200mg  to 300mg . However, it appears her discharge medications above still states 200mg  which should appear as 300mg  once daily.   #COPD: Stable. At home, chronically on 2L home O2; Clinical presentation not typical of COPD exacerbation.  She was given duonebs with improvement in her breathing.   -#CKD, stage III: Stable (GFR 44). Baseline creatinine is ~1.0. Renal function was followed closely.    #Atrial Fibrillation: H/o sick sinus syndrome with recurrent bradycardia and intolerance to beta blockers. Asymptomatic. She was supratherapeutic upon admission and coumadin was  dosed per pharmacy.    #Normocytic Anemia: Baseline Hb seems to be around 10 to 11. Hb 10.2 at admission.  We  monitored for symptoms/signs of bleeding.   #HTN: Stable. We were careful not lower preload significantly given AS.    #Hypothyroidism: TSH 4.144 (wnl) on 06/26/12.  Continued home levothyroxine 150 mcg daily.  #DM type 2, controlled: A1c 7.3 on 02/16/13. At home on glipizide 5mg  dialy. The patient is allergic to Lantus-held home glipizide and gave SSI-s and continued gabapentin.   #Depression: Stable. Continued prozac.  #VTE ppx: Anticoagulation (coumadin).   #Code Status: Full code.  Pt was referred to [facility] services which she initially refused because she thought this service cost money. I assured her this was not the case and that it was totally free to her. I emphasized that this would be a good option for her that may be able to keep her out of the hospital since this is her third admission in the past 6 months.  She agreed to services.   Discharge Vitals:   BP 157/50  Pulse 88  Temp(Src) 98 F (36.7 C) (Oral)  Resp 20  Ht 5\' 2"  (1.575 m)  Wt 267 lb 10.2 oz (121.4 kg)  BMI 48.94 kg/m2  SpO2 99%  LMP 05/22/1968  Discharge Labs:  No results found for this or any previous visit (from the past 24 hour(s)).  Signed: Michail Jewels, MD [phone] 04/06/2013, 7:55 PM   Time Spent on Discharge: 81minutes Services Ordered on Discharge: None Equipment Ordered on Discharge: None

## 2013-04-01 NOTE — Progress Notes (Addendum)
Subjective:    Pt is resting comfortably in the bed and reports feeling better this AM and her breathing has improved.  She did not have any events overnight.    Objective:   Vital signs in last 24 hours: Filed Vitals:   03/31/13 2050 04/01/13 0620 04/01/13 0847 04/01/13 0859  BP: 131/47 133/52 136/56   Pulse: 48 51 54   Temp: 97.7 F (36.5 C) 98.2 F (36.8 C)    TempSrc: Oral Oral    Resp: 16 16    Height:      Weight:  263 lb 12.8 oz (119.659 kg)    SpO2: 99% 98%  97%   Weight change:   Intake/Output Summary (Last 24 hours) at 04/01/13 1227 Last data filed at 04/01/13 3220  Gross per 24 hour  Intake    600 ml  Output      0 ml  Net    600 ml    Physical Exam: Constitutional: Vital signs reviewed.  Patient is in no acute distress and cooperative with exam.   Head: Normocephalic and atraumatic Eyes: PERRL, EOMI, conjunctivae normal, no scleral icterus  Neck: Supple, Trachea midline Cardiovascular: RRR, no MRG, DP 2+ b/l, trace edema Pulmonary/Chest: O2 sat 98% 2L [place]; normal respiratory effort without increased work of breathing; decreased breath sounds b/l with expiratory wheezing; no rales, or rhonchi Abdominal: Obese. Soft. +BS, Non-tender, non-distended Neurological: A&O x3, cranial nerve II-XII are grossly intact, moving all extremities  Skin: Warm, dry and intact. No rash Psychiatric: Normal mood and affect.   Lab Results:  BMP:  Recent Labs Lab 03/31/13 0858  NA 141  K 4.5  CL 98  CO2 31  GLUCOSE 163*  BUN 34*  CREATININE 1.15*  CALCIUM 10.1    CBC:  Recent Labs Lab 03/31/13 0858  WBC 7.2  HGB 10.2*  HCT 34.8*  MCV 77.0*  PLT 232    Coagulation:  Recent Labs Lab 03/31/13 0858 04/01/13 1130  LABPROT 31.7* 30.9*  INR 3.21* 3.11*    CBG:            Recent Labs Lab 03/31/13 1638 03/31/13 2038 04/01/13 0757 04/01/13 1113  GLUCAP 127* 160* 149* 172*           HA1C:      No results found for this basename: HGBA1C,  in  the last 168 hours  Lipid Panel: No results found for this basename: CHOL, HDL, LDLCALC, TRIG, CHOLHDL, LDLDIRECT,  in the last 168 hours  LFTs: No results found for this basename: AST, ALT, ALKPHOS, BILITOT, PROT, ALBUMIN,  in the last 168 hours  Pancreatic Enzymes: No results found for this basename: LIPASE, AMYLASE,  in the last 168 hours  Ammonia: No results found for this basename: AMMONIA,  in the last 168 hours  Cardiac Enzymes: No results found for this basename: CKTOTAL, CKMB, CKMBINDEX, TROPONINI,  in the last 168 hours Lab Results  Component Value Date   CKTOTAL 66 05/08/2010   CKMB 2.9 05/08/2010   TROPONINI <0.30 02/05/2013    EKG: @EKGMUSE @  BNP:  Recent Labs Lab 03/31/13 0858  PROBNP 5351.0*    D-Dimer: No results found for this basename: DDIMER,  in the last 168 hours  Urinalysis: No results found for this basename: COLORURINE, APPERANCEUR, LABSPEC, PHURINE, GLUCOSEU, HGBUR, BILIRUBINUR, KETONESUR, PROTEINUR, UROBILINOGEN, NITRITE, LEUKOCYTESUR,  in the last 168 hours  Micro Results: Recent Results (from the past 240 hour(s))  MRSA PCR SCREENING  Status: None   Collection Time    03/31/13  3:04 PM      Result Value Range Status   MRSA by PCR NEGATIVE  NEGATIVE Final   Comment:            The GeneXpert MRSA Assay (FDA     approved for NASAL specimens     only), is one component of a     comprehensive MRSA colonization     surveillance program. It is not     intended to diagnose MRSA     infection nor to guide or     monitor treatment for     MRSA infections.    Blood Culture:    Component Value Date/Time   SDES URINE, CLEAN CATCH 02/05/2013 0503   SPECREQUEST NONE 02/05/2013 0503   CULT  Value: ENTEROCOCCUS SPECIES Performed at [facility] 02/05/2013 0503   REPTSTATUS 02/07/2013 FINAL 02/05/2013 0503    Studies/Results: Dg Chest Port 1 View  03/31/2013   CLINICAL DATA:  Short of breath.  Congestive heart failure.  EXAM:  PORTABLE CHEST - 1 VIEW  COMPARISON:  02/05/2013  FINDINGS: Artifact overlies the chest. The patient has taken a poor inspiration. The heart is enlarged. There is calcification of the aorta up. There is interstitial and early alveolar pulmonary edema. No effusions. No acute bony finding. Chronic rotator cuff tears.  IMPRESSION: Cardiomegaly. Interstitial and early alveolar edema. Findings consistent with congestive heart failure.   Electronically Signed   By: Nelson Chimes M.D.   On: 03/31/2013 08:42    Medications:  Scheduled Meds: . albuterol  2.5 mg Nebulization Q6H  . allopurinol  100 mg Oral BID  . aspirin EC  81 mg Oral Daily  . atorvastatin  40 mg Oral Daily  . FLUoxetine  20 mg Oral BID  . furosemide  80 mg Intravenous BID  . gabapentin  200 mg Oral BID  . insulin aspart  0-15 Units Subcutaneous TID WC  . levothyroxine  150 mcg Oral QAC breakfast  . mometasone-formoterol  2 puff Inhalation BID  . pantoprazole  40 mg Oral Daily  . predniSONE  40 mg Oral Q breakfast  . sodium chloride  3 mL Intravenous Q12H  . sodium chloride  3 mL Intravenous Q12H  . tiotropium  18 mcg Inhalation Daily  . Warfarin - Pharmacist Dosing Inpatient   Does not apply q1800   Continuous Infusions:  PRN Meds:.sodium chloride, acetaminophen, polyethylene glycol, sodium chloride  Antibiotics: Anti-infectives   None     Antibiotics Given (last 72 hours)   None      Day of Hospitalization:  1 Consults:    Assessment/Plan:   Principal Problem:   Acute on chronic systolic CHF (congestive heart failure) Active Problems:   HYPOTHYROIDISM   DIABETES MELLITUS   HYPERLIPIDEMIA   GOUT   OBESITY, MORBID   HYPERTENSION   Atrial fibrillation   COPD   OSTEOARTHRITIS   Aortic stenosis   Coronary atherosclerosis of native coronary artery  #?COPD exacerbation - Pt appeared comfortable this AM and is saturating well on 2L [place] at 98%.  She is normally on 2L home O2.  She has subjective increased SOB  and prior. She chronically has a component of OHS.  Doubt HF exacerbation as pt does not appear volume overloaded.  -Continue lasix 80 IV BID (twice home dose, cautiously diurese given AS)  -Low Na diet  -Daily weights  -AM BMET  -Duonebs q6h scheduled to keep SpO2>92%   #  Gout: of right MTP, improving; uric acid 7.0.  Home allopurinol 100mg  bid.  Has already treated with colchicine x 3 d. -increase allopurinol to 300mg  once daily   #CKD, stage III: Stable (GFR 44). Baseline creatinine is ~1.0  - Lasix as above  - follow renal function carefully   #Atrial Fibrillation: H/o sick sinus syndrome with recurrent bradycardia and intolerance to beta blockers. Asymptomatic. On chronic anticoagulation with supratherapeutic INR.  -Coumadin per pharmacy   #Normocytic Anemia: Baseline Hb seems to be around 10 to 11. Hb 10.2 at admission.  -clinically monitor, repeat CBC if indicated   #HTN: Stable. Careful not to lower preload significantly given AS  -Lasix as above   #Hypothyroidism: TSH 4.144 (wnl) on 06/26/12.  -Continue home Levothyroxine 150 mcg daily   #DM type 2, controlled: A1c 7.3 on 02/16/13. At home on glipizide 5mg  dialy. The patient is allergic to Lantus.  -Hold home glipizide  -SSI-moderate  -Continue Gabapentin   CBG  Recent Labs  03/31/13 2038 04/01/13 0757 04/01/13 1113  GLUCAP 160* 149* 172*   #Depression: Stable.  -Continue Prozac   #VTE ppx: Anticoagulation (coumadin)   #Code Status: Full code   #Dispo- Disposition is deferred at this time, awaiting improvement of current medical problems.  Anticipated discharge in approximately 1-2 day(s).    LOS: 1 day   Michail Jewels, MD PGY-1, Internal Medicine  [phone] (7AM-5PM Mon-Fri) 04/01/2013, 12:27 PM

## 2013-04-01 NOTE — Progress Notes (Signed)
[facility] for coumadin Indication: atrial fibrillation  Allergies  Allergen Reactions  . Copper-Containing Compounds Other (See Comments)    Per doctor told her not take meds containing copper  . Lantus [Insulin Glargine]     Causes a lot of itching  . Nitrofurantoin [Macrodantin] Itching    Patient Measurements: Height: 5\' 2"  (157.5 cm) Weight: 263 lb 12.8 oz (119.659 kg) IBW/kg (Calculated) : 50.1   Vital Signs: Temp: 98.2 F (36.8 C) (02/05 0620) Temp src: Oral (02/05 0620) BP: 136/56 mmHg (02/05 0847) Pulse Rate: 54 (02/05 0847)  Labs:  Recent Labs  03/31/13 0858  HGB 10.2*  HCT 34.8*  PLT 232  LABPROT 31.7*  INR 3.21*  CREATININE 1.15*    Estimated Creatinine Clearance: 49.6 ml/min (by C-G formula based on Cr of 1.15).  Medications:  Coumadin 5 mg every day except 2.5 mg on Fridays. Last dose 03/30/13.  Assessment: 80 yo F on coumadin PTA for Afib.  Also PMH for PE, DVT, has Greenfield filter.  She was seen in IM clinic 03/19/13 for c/o 2 week hx of epistaxsis.  Her INR on 1/23 was 3.5 and her dose was decreased from 5 mg daily to 5 mg daily except 2.5 mg on Fridays.  Pt admitted for SOB, possibly CHF exacerbation.   INR starting to trend down (3.1 this am), no bleeding issues have been noted.  Goal of Therapy: INR 2-3    Plan:  1. Coumadin 2mg  tonight 2. Daily INR  Erin Hearing PharmD., BCPS Clinical Pharmacist Pager [phone] 04/01/2013 10:47 AM

## 2013-04-02 DIAGNOSIS — I5023 Acute on chronic systolic (congestive) heart failure: Principal | ICD-10-CM | POA: Diagnosis present

## 2013-04-02 LAB — BASIC METABOLIC PANEL
BUN: 39 mg/dL — ABNORMAL HIGH (ref 6–23)
CO2: 32 mEq/L (ref 19–32)
Calcium: 9.8 mg/dL (ref 8.4–10.5)
Chloride: 97 mEq/L (ref 96–112)
Creatinine, Ser: 1.18 mg/dL — ABNORMAL HIGH (ref 0.50–1.10)
GFR calc Af Amer: 50 mL/min — ABNORMAL LOW (ref >90)
GFR calc non Af Amer: 43 mL/min — ABNORMAL LOW (ref >90)
Glucose, Bld: 243 mg/dL — ABNORMAL HIGH (ref 70–99)
Potassium: 4.6 mEq/L (ref 3.7–5.3)
Sodium: 141 mEq/L (ref 137–147)

## 2013-04-02 LAB — GLUCOSE, CAPILLARY
GLUCOSE-CAPILLARY: 199 mg/dL — AB (ref 70–99)
Glucose-Capillary: 207 mg/dL — ABNORMAL HIGH (ref 70–99)
Glucose-Capillary: 206 mg/dL — ABNORMAL HIGH (ref 70–99)
Glucose-Capillary: 301 mg/dL — ABNORMAL HIGH (ref 70–99)

## 2013-04-02 LAB — PROTIME-INR
INR: 2.78 — ABNORMAL HIGH (ref 0.00–1.49)
Prothrombin Time: 28.4 seconds — ABNORMAL HIGH (ref 11.6–15.2)

## 2013-04-02 MED ORDER — FUROSEMIDE 80 MG PO TABS
80.0000 mg | ORAL_TABLET | Freq: Two times a day (BID) | ORAL | Status: DC
  Administered 2013-04-02 – 2013-04-03 (×2): 80 mg via ORAL
  Filled 2013-04-02 (×4): qty 1

## 2013-04-02 MED ORDER — WARFARIN SODIUM 3 MG PO TABS
3.0000 mg | ORAL_TABLET | Freq: Once | ORAL | Status: AC
  Administered 2013-04-02: 3 mg via ORAL
  Filled 2013-04-02: qty 1

## 2013-04-02 NOTE — Progress Notes (Signed)
Came to visit patient at bedside initially and patient declined [facility] services. Went back into room with inpatient RNCM to explain again [facility] services and explain how she could really benefit from them. Patient now agreeable and consents signed. Explained to her that [place] [facility] services will not interfere with home health services that she will have. Confirmed best contact number. Left [facility] Care Management packet and contact information at bedside. Appreciative of inpatient RNCM assist with this patient.  Marthenia Rolling, MSN- [facility] [phone]

## 2013-04-02 NOTE — Evaluation (Signed)
Physical Therapy Evaluation Patient Details Name: [name] MRN: [id number] DOB: [birth year] Today's Date: 04/02/2013 Time: 9675-9163 PT Time Calculation (min): 20 min  PT Assessment / Plan / Recommendation History of Present Illness  Pt admit with COPD exacerbation.    Clinical Impression  Pt with below deficits, decreased caregiver support (with pt providing varied times aide is available) and multiple falls at home. She will benefit from acute therapy to address all above to improve function, safety and gait to decrease burden of care and fall risk. Pt states she will refuse additional time at SNF but agreeable to [place] would benefit from increased supervision at home but states this is not possible. Will follow and recommend nursing assist for all mobility.     PT Assessment  Patient needs continued PT services    Follow Up Recommendations  SNF;Supervision for mobility/OOB    Does the patient have the potential to tolerate intense rehabilitation      Barriers to Discharge Decreased caregiver support      Equipment Recommendations  3in1 (PT)    Recommendations for Other Services     Frequency Min 3X/week    Precautions / Restrictions Precautions Precautions: Fall   Pertinent Vitals/Pain No pain sats 95% on 2L at rest with drop to 85% on 2L with in room mobility and 1 min seated rest with cues for pursed lip breathing to recover sats to 95%      Mobility  Bed Mobility Overal bed mobility: Needs Assistance Bed Mobility: Supine to Sit;Sit to Supine Supine to sit: Supervision;HOB elevated. Pt denied OOB to char as she had returned to bed after sitting 3hrs Sit to supine: Modified independent (Device/Increase time) General bed mobility comments: with heavy reliance on rail and elevating HOB to sit despite cues for need to perform from flat surface Transfers Overall transfer level: Needs assistance Transfers: Sit to/from Stand Sit to Stand:  Supervision General transfer comment: cueing for safety and hand placement with momentum to achieve standing Ambulation/Gait Ambulation/Gait assistance: Min guard Ambulation Distance (Feet): 30 Feet Assistive device: Rolling walker (2 wheeled) Gait Pattern/deviations: Step-through pattern;Wide base of support;Trunk flexed Gait velocity interpretation: <1.8 ft/sec, indicative of risk for recurrent falls General Gait Details: cueing for posture and position in RW as well as assist to manage lines, pt not attending to where O2 tubing is throughout session    Exercises General Exercises - Lower Extremity Long Arc Quad: AROM;Seated;Both;10 reps Hip Flexion/Marching: AROM;Seated;Both;5 reps   PT Diagnosis: Difficulty walking  PT Problem List: Decreased strength;Decreased cognition;Decreased activity tolerance;Decreased knowledge of use of DME;Decreased mobility;Cardiopulmonary status limiting activity PT Treatment Interventions: Gait training;Stair training;Functional mobility training;Therapeutic activities;Therapeutic exercise;Patient/family education;DME instruction     PT Goals(Current goals can be found in the care plan section) Acute Rehab PT Goals Patient Stated Goal: to return home PT Goal Formulation: With patient Time For Goal Achievement: 04/16/13 Potential to Achieve Goals: Fair  Visit Information  Last PT Received On: 04/02/13 Assistance Needed: +1 History of Present Illness: Pt admit with COPD exacerbation.         Prior [place] expects to be discharged to:: Private residence Living Arrangements: Children Available Help at Discharge: Family;Personal care attendant;Available PRN/intermittently Type of Home: House Home Access: Stairs to enter [employer] of Steps: 2 Entrance Stairs-Rails: Can reach both Home Layout: One level Home Equipment: Walker - 2 wheels;Shower seat;Wheelchair - manual Prior Function Level of  Independence: Needs assistance Gait / Transfers Assistance Needed: pt states she  gets around on her own with RW ADL's / Homemaking Assistance Needed: aide for bathing, dressing and housework. Aide also does 2 meals and meals on wheels provides 3rd Comments: pt states she has aide 9-11am and then around 2-5pm and that son is home 6p-6a but that aide schedule varies pending when she is in school. Pt reports >10 falls in the last year Communication Communication: No difficulties Dominant Hand: Right    Cognition  Cognition Arousal/Alertness: Awake/alert Behavior During Therapy: WFL for tasks assessed/performed Overall Cognitive Status: No family/caregiver present to determine baseline cognitive functioning    Extremity/Trunk Assessment Upper Extremity Assessment Upper Extremity Assessment: Generalized weakness Lower Extremity Assessment Lower Extremity Assessment: Generalized weakness;RLE deficits/detail RLE Deficits / Details: bil LE hip ROM limited by pannus Cervical / Trunk Assessment Cervical / Trunk Assessment: Normal   Balance    End of Session PT - End of Session Equipment Utilized During Treatment: Oxygen Activity Tolerance: Patient limited by fatigue Patient left: in bed;with call bell/phone within reach Nurse Communication: Mobility status  GP     Lanetta Inch Beth 04/02/2013, 2:27 PM Elwyn Reach, [place]

## 2013-04-02 NOTE — Progress Notes (Signed)
Inpatient Diabetes Program Recommendations  AACE/ADA: New Consensus Statement on Inpatient Glycemic Control (2013)  Target Ranges:  Prepandial:   less than 140 mg/dL      Peak postprandial:   less than 180 mg/dL (1-2 hours)      Critically ill patients:  140 - 180 mg/dL  Results for [name], [name] (MRN [id number]) as of 04/02/2013 12:20  Ref. Range 04/01/2013 11:13 04/01/2013 16:20 04/01/2013 21:00 04/02/2013 07:48 04/02/2013 11:33  Glucose-Capillary Latest Range: 70-99 mg/dL 172 (H) 142 (H) 281 (H) 207 (H) 206 (H)   Reason for Visit: Elevated CBGs  Diabetes history: Type 2 diabetes Outpatient Diabetes medications: Glucotrol 10 mg BID Current orders for Inpatient glycemic control: Novolog MODERATE correction scale TID   Note: Recommend adding Novolog 3-4 units AC meal coverage if postprandial CBGs continue greater than 180 mg/dl and patient eats greater than 50% of meals.   Will continue to follow while in hospital.  Harvel Ricks RN BSN CDE

## 2013-04-02 NOTE — Progress Notes (Signed)
Received referral for [facility] Care Management services. Patient has been engaged by [facility] on multiple occassions in the past and she has declined. However, will meet with patient at bedside to speak with her about enrolling in [facility] services again for disease management. Marthenia Rolling, MSN, RN,BSN- [facility] [phone]

## 2013-04-02 NOTE — Care Management Note (Unsigned)
    Page 1 of 1   04/02/2013     11:48:54 AM   CARE MANAGEMENT NOTE 04/02/2013  Patient:  KATALEYA, ZAUGG   Account Number:  [id number]  Date Initiated:  04/02/2013  Documentation initiated by:  GRAVES-BIGELOW,Lavonya Hoerner  Subjective/Objective Assessment:   Pt admitted for CHF. Pt states her son stays with her at night. Pt states she is not able to weigh herself with the scale she has. Plans to have [facility] monitor her by [facility]. Pt is also agreeable to [facility] services. Has aide w/[employer].     Action/Plan:   CM did make referral with [facility] for services. Pt will need order for [facility] for disease management. Pt will need PT consult here and will probably benefit from Home PT services.   Anticipated DC Date:  04/03/2013   Anticipated DC Plan:  [place]  CM consult      [facility] Choice  HOME HEALTH   Choice offered to / List presented to:  C-1 Patient        [place] arranged  HH-1 RN  [place] PT      [place].   Status of service:  In process, will continue to follow Medicare Important Message given?   (If response is "NO", the following Medicare IM given date fields will be blank) Date Medicare IM given:   Date Additional Medicare IM given:    Discharge Disposition:  [place]  Per UR Regulation:  Reviewed for med. necessity/level of care/duration of stay  If discussed at [place] of Stay Meetings, dates discussed:    Comments:

## 2013-04-02 NOTE — Progress Notes (Signed)
[facility] for coumadin Indication: atrial fibrillation  Allergies  Allergen Reactions  . Copper-Containing Compounds Other (See Comments)    Per doctor told her not take meds containing copper  . Lantus [Insulin Glargine]     Causes a lot of itching  . Nitrofurantoin [Macrodantin] Itching    Patient Measurements: Height: 5\' 2"  (157.5 cm) Weight: 263 lb 6.4 oz (119.477 kg) IBW/kg (Calculated) : 50.1   Vital Signs: Temp: 97.7 F (36.5 C) (02/06 0750) Temp src: Oral (02/06 0750) BP: 169/52 mmHg (02/06 0750) Pulse Rate: 58 (02/06 0750)  Labs:  Recent Labs  03/31/13 0858 04/01/13 1130 04/02/13 0529  HGB 10.2*  --   --   HCT 34.8*  --   --   PLT 232  --   --   LABPROT 31.7* 30.9* 28.4*  INR 3.21* 3.11* 2.78*  CREATININE 1.15* 1.18* 1.18*    Estimated Creatinine Clearance: 48.3 ml/min (by C-G formula based on Cr of 1.18).  Medications:  Coumadin 5 mg every day except 2.5 mg on Fridays. Last dose 03/30/13.  Assessment: 80 yo F on coumadin PTA for Afib.  Also PMH for PE, DVT, has Greenfield filter.  She was seen in IM clinic 03/19/13 for c/o 2 week hx of epistaxsis.  Her INR on 1/23 was 3.5 and her dose was decreased from 5 mg daily to 5 mg daily except 2.5 mg on Fridays.  Pt admitted for SOB, possibly CHF exacerbation.   INR trending down (2.7 this am), no bleeding issues have been noted.  Goal of Therapy: INR 2-3    Plan:  1. Coumadin 3 mg tonight 2. Daily INR  Erin Hearing PharmD., BCPS Clinical Pharmacist Pager [phone] 04/02/2013 8:19 AM

## 2013-04-02 NOTE — Progress Notes (Signed)
[facility] notified of pt having crackles. Pt not experiencing sob nor a decrease O2 sats. No new orders given. Will continue to monitor the pt. Hoover Brunette, RN

## 2013-04-02 NOTE — Progress Notes (Signed)
Subjective:    Pt is comfortably sitting in the chair this AM and reports feeling better.  She states her  breathing has improved and did not have any events overnight.    Objective:   Vital signs in last 24 hours: Filed Vitals:   04/02/13 0922 04/02/13 1200 04/02/13 1414 04/02/13 1419  BP:  140/69    Pulse:  53    Temp:  97.6 F (36.4 C)    TempSrc:  Oral    Resp:  22    Height:      Weight:      SpO2: 96% 100% 100% 96%   Weight change: -7 lb 9.6 oz (-3.447 kg)  Intake/Output Summary (Last 24 hours) at 04/02/13 1422 Last data filed at 04/02/13 1300  Gross per 24 hour  Intake    800 ml  Output      0 ml  Net    800 ml    Physical Exam: Constitutional: Vital signs reviewed.  Patient is in no acute distress and cooperative with exam.   Head: Normocephalic and atraumatic Eyes: PERRL, EOMI, conjunctivae normal, no scleral icterus  Neck: Supple, Trachea midline Cardiovascular: RRR, no MRG, DP 2+ b/l, trace edema Pulmonary/Chest: normal respiratory effort without increased work of breathing; improved airflow with minimal b/l with expiratory wheezing; no rales, or rhonchi Abdominal: Obese. Soft. +BS, Non-tender, non-distended Neurological: A&O x3, cranial nerve II-XII are grossly intact, moving all extremities  Skin: Warm, dry and intact.  Psychiatric: Normal mood and affect.   Lab Results:  BMP:  Recent Labs Lab 04/01/13 1130 04/02/13 0529  NA 138 141  K 4.4 4.6  CL 96 97  CO2 32 32  GLUCOSE 164* 243*  BUN 35* 39*  CREATININE 1.18* 1.18*  CALCIUM 9.8 9.8    CBC:  Recent Labs Lab 03/31/13 0858  WBC 7.2  HGB 10.2*  HCT 34.8*  MCV 77.0*  PLT 232    Coagulation:  Recent Labs Lab 03/31/13 0858 04/01/13 1130 04/02/13 0529  LABPROT 31.7* 30.9* 28.4*  INR 3.21* 3.11* 2.78*    CBG:            Recent Labs Lab 04/01/13 0757 04/01/13 1113 04/01/13 1620 04/01/13 2100 04/02/13 0748 04/02/13 1133  GLUCAP 149* 172* 142* 281* 207* 206*            HA1C:      No results found for this basename: HGBA1C,  in the last 168 hours  Lipid Panel: No results found for this basename: CHOL, HDL, LDLCALC, TRIG, CHOLHDL, LDLDIRECT,  in the last 168 hours  LFTs: No results found for this basename: AST, ALT, ALKPHOS, BILITOT, PROT, ALBUMIN,  in the last 168 hours  Pancreatic Enzymes: No results found for this basename: LIPASE, AMYLASE,  in the last 168 hours  Ammonia: No results found for this basename: AMMONIA,  in the last 168 hours  Cardiac Enzymes: No results found for this basename: CKTOTAL, CKMB, CKMBINDEX, TROPONINI,  in the last 168 hours Lab Results  Component Value Date   CKTOTAL 66 05/08/2010   CKMB 2.9 05/08/2010   TROPONINI <0.30 02/05/2013    EKG: @EKGMUSE @  BNP:  Recent Labs Lab 03/31/13 0858  PROBNP 5351.0*    D-Dimer: No results found for this basename: DDIMER,  in the last 168 hours  Urinalysis: No results found for this basename: COLORURINE, APPERANCEUR, LABSPEC, PHURINE, GLUCOSEU, HGBUR, BILIRUBINUR, KETONESUR, PROTEINUR, UROBILINOGEN, NITRITE, LEUKOCYTESUR,  in the last 168 hours  Micro Results:  Recent Results (from the past 240 hour(s))  MRSA PCR SCREENING     Status: None   Collection Time    03/31/13  3:04 PM      Result Value Range Status   MRSA by PCR NEGATIVE  NEGATIVE Final   Comment:            The GeneXpert MRSA Assay (FDA     approved for NASAL specimens     only), is one component of a     comprehensive MRSA colonization     surveillance program. It is not     intended to diagnose MRSA     infection nor to guide or     monitor treatment for     MRSA infections.    Blood Culture:    Component Value Date/Time   SDES URINE, CLEAN CATCH 02/05/2013 0503   SPECREQUEST NONE 02/05/2013 0503   CULT  Value: ENTEROCOCCUS SPECIES Performed at [facility] 02/05/2013 0503   REPTSTATUS 02/07/2013 FINAL 02/05/2013 0503    Studies/Results: No results  found.  Medications:  Scheduled Meds: . allopurinol  300 mg Oral Daily  . aspirin EC  81 mg Oral Daily  . atorvastatin  40 mg Oral Daily  . FLUoxetine  20 mg Oral BID  . furosemide  80 mg Intravenous BID  . gabapentin  200 mg Oral BID  . insulin aspart  0-15 Units Subcutaneous TID WC  . ipratropium-albuterol  3 mL Nebulization Q6H  . levothyroxine  150 mcg Oral QAC breakfast  . pantoprazole  40 mg Oral Daily  . predniSONE  40 mg Oral Q breakfast  . sodium chloride  3 mL Intravenous Q12H  . sodium chloride  3 mL Intravenous Q12H  . warfarin  3 mg Oral ONCE-1800  . Warfarin - Pharmacist Dosing Inpatient   Does not apply q1800   Continuous Infusions:  PRN Meds:.sodium chloride, acetaminophen, polyethylene glycol, sodium chloride  Antibiotics: Anti-infectives   None     Antibiotics Given (last 72 hours)   None      Day of Hospitalization:  2 Consults:    Assessment/Plan:   Principal Problem:   Acute on chronic systolic CHF (congestive heart failure) Active Problems:   HYPOTHYROIDISM   DIABETES MELLITUS   HYPERLIPIDEMIA   GOUT   OBESITY, MORBID   HYPERTENSION   Atrial fibrillation   COPD   OSTEOARTHRITIS   Aortic stenosis   Coronary atherosclerosis of native coronary artery   Acute on chronic systolic heart failure  #?COPD exacerbation - Pt appeared comfortable this AM and is saturating well.  She is normally on 2-4L home O2 and has not required more than this during her hospitalization.  She reports her breathing has improved.  Doubt HF exacerbation as pt does not appear volume overloaded.  Pt may benefit from PT/OT as well.   -d/c lasix 80 IV BID  -resume home dose of 80mg  lasix bid -Low Na diet  -Daily weights  -Duonebs q6h scheduled to keep SpO2>92%  -BMP wnl -PT/OT  #Gout: of right MTP, improving; uric acid 7.0.  Home allopurinol 100mg  bid.  Has already treated with colchicine x 3 d. -increase allopurinol to 300mg  once daily   #CKD, stage III: Stable  (GFR 44). Baseline creatinine is ~1.0  - Lasix as above  - follow renal function carefully   #Atrial Fibrillation: H/o sick sinus syndrome with recurrent bradycardia and intolerance to beta blockers. Asymptomatic. On chronic anticoagulation with therapeutic INR.  -Coumadin per pharmacy   #  Normocytic Anemia: Baseline Hb seems to be around 10 to 11. Hb 10.2 at admission.  -clinically monitor, repeat CBC if indicated   #HTN: Stable. Careful not to lower preload significantly given AS  -Lasix as above   #Hypothyroidism: TSH 4.144 (wnl) on 06/26/12.  -Continue home Levothyroxine 150 mcg daily   #DM type 2, controlled: A1c 7.3 on 02/16/13. At home on glipizide 5mg  dialy. The patient is allergic to Lantus.  -Hold home glipizide  -SSI-moderate  -Continue Gabapentin   CBG  Recent Labs  04/01/13 2100 04/02/13 0748 04/02/13 1133  GLUCAP 281* 207* 206*   #Depression: Stable.  -Continue Prozac   #VTE ppx: Anticoagulation (coumadin)   #Code Status: Full code   #Dispo- Disposition is deferred at this time, awaiting improvement of current medical problems.  Anticipated discharge in approximately 1-2 day(s).    LOS: 2 days   Michail Jewels, MD PGY-1, Internal Medicine  [phone] (7AM-5PM Mon-Fri) 04/02/2013, 2:22 PM

## 2013-04-02 NOTE — Progress Notes (Signed)
  Date: 04/02/2013  Patient name: [name]  Medical record number: [id number]  Date of birth: November 25, 1932   This patient has been seen and the plan of care was discussed with the house staff. Please see their note for complete details. I concur with their findings with the following additions/corrections:  Patient continues to improve. Will chang her from IV to home po lasix dose, finish steroid course, continue bronchodilators. She is now agreeable to enrollment with [facility] thanks to persistance of Lorelee New and my Intern Dr. Gordy Levan  I anticipate she should be able to DC in the am after working with PT today.    Truman Hayward, MD 04/02/2013, 1:52 PM

## 2013-04-02 NOTE — Progress Notes (Signed)
UR completed. Patient changed to inpatient- requiring IV lasix scheduled.

## 2013-04-03 LAB — GLUCOSE, CAPILLARY
GLUCOSE-CAPILLARY: 204 mg/dL — AB (ref 70–99)
GLUCOSE-CAPILLARY: 175 mg/dL — AB (ref 70–99)

## 2013-04-03 LAB — PROTIME-INR
INR: 2.62 — ABNORMAL HIGH (ref 0.00–1.49)
Prothrombin Time: 27.1 seconds — ABNORMAL HIGH (ref 11.6–15.2)

## 2013-04-03 MED ORDER — WARFARIN SODIUM 2.5 MG PO TABS: 2.5000 mg | ORAL_TABLET | ORAL | Status: DC

## 2013-04-03 MED ORDER — LEVOTHYROXINE SODIUM 150 MCG PO TABS: 150.0000 ug | ORAL_TABLET | Freq: Every day | ORAL | Status: DC

## 2013-04-03 MED ORDER — WARFARIN SODIUM 5 MG PO TABS
5.0000 mg | ORAL_TABLET | ORAL | Status: DC
  Filled 2013-04-03: qty 1

## 2013-04-03 MED ORDER — PREDNISONE 20 MG PO TABS: 40.0000 mg | ORAL_TABLET | Freq: Every day | ORAL | Status: AC

## 2013-04-03 NOTE — Progress Notes (Signed)
   CARE MANAGEMENT NOTE 04/03/2013  Patient:  [name], [name]   Account Number:  [id number]  Date Initiated:  04/02/2013  Documentation initiated by:  GRAVES-BIGELOW,BRENDA  Subjective/Objective Assessment:   Pt admitted for CHF. Pt states her son stays with her at night. Pt states she is not able to weigh herself with the scale she has. Plans to have [facility] monitor her by [facility]. Pt is also agreeable to [facility] services. Has aide w/[employer].     Action/Plan:   CM did make referral with [facility] for services. Pt will need order for [facility] for disease management. Pt will need PT consult here and will probably benefit from Home PT services.   Anticipated DC Date:  04/03/2013   Anticipated DC Plan:  [place]  CM consult      [facility] Choice  HOME HEALTH   Choice offered to / List presented to:  C-1 Patient        [place] arranged  HH-1 RN  [place] PT      [employer].   Status of service:  Completed, signed off Medicare Important Message given?   (If response is "NO", the following Medicare IM given date fields will be blank) Date Medicare IM given:   Date Additional Medicare IM given:    Discharge Disposition:  [place]  Per UR Regulation:  Reviewed for med. necessity/level of care/duration of stay  If discussed at [place] of Stay Meetings, dates discussed:    Comments:  04/03/13 15:15 Cm spoke with pt in room as pt states she does not need any home equipment unless we could get her a scale with handles on it bc she feels unsteady on her [employer] scale.  CM asked if anyone lives with her and she states her son lives with her.  Pt agrees to have son weigh her daily (at the same time eadh day) so she can manage her fluid load more effectively.  Pt is set up with [facility] for HHPT/RN. [facility] was notified of discharge. Address and contact number verified with pt.  No other CM needs were  communicated.  Mariane Masters, BSn, CM [phone].

## 2013-04-03 NOTE — Progress Notes (Signed)
Subjective: She feels better today. She is agreeable to [facility] services at home. Objective: Vital signs in last 24 hours: Filed Vitals:   04/03/13 0551 04/03/13 0600 04/03/13 0900 04/03/13 0952  BP: 125/63   136/63  Pulse: 61   60  Temp: 97.5 F (36.4 C)   98.2 F (36.8 C)  TempSrc: Oral   Oral  Resp: 20   20  Height:      Weight:  267 lb 10.2 oz (121.4 kg)    SpO2: 97%  98% 100%   Weight change: 4 lb 3.8 oz (1.923 kg)  Intake/Output Summary (Last 24 hours) at 04/03/13 1231 Last data filed at 04/02/13 1300  Gross per 24 hour  Intake    320 ml  Output      0 ml  Net    320 ml    General: Vital signs reviewed. Alert, oriented x3. Having breakfast. Neck: No bruits  Lungs: Clear bilaterally on auscultation.  Heart: Regular; no extra sounds or murmurs  Abdomen: Bowel sounds present, soft, nontender; no hepatosplenomegaly  Extremities: Trace bilateral ankle edema  Neurologic: Alert and oriented x3; cranial nerves II through XII intact; motor 4/5 in left upper extremity, otherwise 5 over 5  Lab Results: Basic Metabolic Panel:  Recent Labs Lab 04/01/13 1130 04/02/13 0529  NA 138 141  K 4.4 4.6  CL 96 97  CO2 32 32  GLUCOSE 164* 243*  BUN 35* 39*  CREATININE 1.18* 1.18*  CALCIUM 9.8 9.8   CBC:  Recent Labs Lab 03/31/13 0858  WBC 7.2  HGB 10.2*  HCT 34.8*  MCV 77.0*  PLT 232   CBG:  Recent Labs Lab 04/02/13 0748 04/02/13 1133 04/02/13 1632 04/02/13 2211 04/03/13 0759 04/03/13 1203  GLUCAP 207* 206* 199* 301* 204* 175*   Coagulation:  Recent Labs Lab 03/31/13 0858 04/01/13 1130 04/02/13 0529 04/03/13 0350  LABPROT 31.7* 30.9* 28.4* 27.1*  INR 3.21* 3.11* 2.78* 2.62*    Micro Results: Recent Results (from the past 240 hour(s))  MRSA PCR SCREENING     Status: None   Collection Time    03/31/13  3:04 PM      Result Value Range Status   MRSA by PCR NEGATIVE  NEGATIVE Final   Comment:            The GeneXpert MRSA Assay (FDA     approved for  NASAL specimens     only), is one component of a     comprehensive MRSA colonization     surveillance program. It is not     intended to diagnose MRSA     infection nor to guide or     monitor treatment for     MRSA infections.   Studies/Results: No results found. Medications: I have reviewed the patient's current medications. Scheduled Meds: . allopurinol  300 mg Oral Daily  . aspirin EC  81 mg Oral Daily  . atorvastatin  40 mg Oral Daily  . FLUoxetine  20 mg Oral BID  . furosemide  80 mg Oral BID  . gabapentin  200 mg Oral BID  . insulin aspart  0-15 Units Subcutaneous TID WC  . ipratropium-albuterol  3 mL Nebulization Q6H  . levothyroxine  150 mcg Oral QAC breakfast  . pantoprazole  40 mg Oral Daily  . predniSONE  40 mg Oral Q breakfast  . sodium chloride  3 mL Intravenous Q12H  . sodium chloride  3 mL Intravenous Q12H  . [START ON 04/09/2013]  warfarin  2.5 mg Oral Q Fri-1800  . warfarin  5 mg Oral Custom  . Warfarin - Pharmacist Dosing Inpatient   Does not apply q1800   Continuous Infusions:  PRN Meds:.sodium chloride, acetaminophen, polyethylene glycol, sodium chloride Assessment/Plan:  #?COPD exacerbation - She is normally on 2-4L home O2 and has not required more than this during her hospitalization. Her presentation was not very classic for COPD exacerbation and chest imaging did not reveal any acute pulmonary process. However, patient's symptoms of shortness of breath and feeling fatigued, prompted inpatient treatment. CHF was felt to be less likely in the view of stable weight and a benign physical exam for fluid overload. She feels better today and will be discharged home. Plan -Continue with the prednisone to complete 5 days. -Continue with home dose of 80mg  lasix bid  -Low Na diet  -Daily weights  -Duonebs q6h scheduled to keep SpO2>92%. Will cont with home O2. -BMP wnl  - Physical therapy had recommended skilled nursing placement, but the patient has declined  this in the past, and she continues to refuse it today. They recommended that if she is to be discharged home to order home health nurse and PT and these have been ordered for her. - Ordered a 3 and 1 commode. - However, patient is interested in [facility] services and this can be followed as outpatient - I will send a message to outpatient clinic front desk to contact her for an Hospital follow up appointment in 5-7 days.  #Atrial Fibrillation: H/o sick sinus syndrome with recurrent bradycardia and intolerance to beta blockers. Asymptomatic. On chronic anticoagulation with therapeutic INR. INR improved from 3.5 on admission to 2.6 to today. -Coumadin per pharmacy  - Pharmacy recommended to continue with Coumadin 5 mg every day except Fridays when she takes 2.5 mg. - I will send a message to the front desk to contact the patient for INR check on Monday.  #Gout: of right MTP, improving; uric acid 7.0. Home allopurinol 100mg  bid. Has already treated with colchicine x 3 d.  -increase allopurinol to 300mg  once daily   #CKD, stage III: Stable (GFR 44). Baseline creatinine is ~1.0  - Lasix as above  - follow renal function carefully   Dispo: Disposition is deferred at this time, awaiting improvement of current medical problems.  Discharge home today.  The patient does have a current PCP (BHARDWAJ, SHILPA, MD), therefore will be requiring OPC follow-up after discharge.   The patient does not have transportation limitations that hinder transportation to clinic appointments.  .Services Needed at time of discharge: Y = Yes, Blank = No PT: Yes   OT:   RN: Yes   Equipment: 3 in 1 commode  Other:     LOS: 3 days   Jessee Avers PGY 2 - Internal Medicine Teaching Service Pager: [phone] 04/03/2013, 12:31 PM

## 2013-04-03 NOTE — Progress Notes (Signed)
[place] for coumadin Indication: atrial fibrillation  Allergies  Allergen Reactions  . Copper-Containing Compounds Other (See Comments)    Per doctor told her not take meds containing copper  . Lantus [Insulin Glargine]     Causes a lot of itching  . Nitrofurantoin [Macrodantin] Itching    Patient Measurements: Height: 5\' 2"  (157.5 cm) Weight: 267 lb 10.2 oz (121.4 kg) IBW/kg (Calculated) : 50.1   Vital Signs: Temp: 97.5 F (36.4 C) (02/07 0551) Temp src: Oral (02/07 0551) BP: 125/63 mmHg (02/07 0551) Pulse Rate: 61 (02/07 0551)  Labs:  Recent Labs  03/31/13 0858 04/01/13 1130 04/02/13 0529 04/03/13 0350  HGB 10.2*  --   --   --   HCT 34.8*  --   --   --   PLT 232  --   --   --   LABPROT 31.7* 30.9* 28.4* 27.1*  INR 3.21* 3.11* 2.78* 2.62*  CREATININE 1.15* 1.18* 1.18*  --     Estimated Creatinine Clearance: 48.8 ml/min (by C-G formula based on Cr of 1.18).  Medications:  Coumadin 5 mg every day except 2.5 mg on Fridays. Last dose 03/30/13.  Assessment: 80 yo F on coumadin PTA for Afib.  Also PMH for PE, DVT, has [place] filter.  She was seen in IM clinic 03/19/13 for c/o 2 week hx of epistaxsis.  Her INR on 1/23 was 3.5 and her dose was decreased from 5 mg daily to 5 mg daily except 2.5 mg on Fridays.  Pt admitted for SOB, possibly CHF exacerbation.   INR continues to trend down (2.6 this am), no bleeding issues have been noted. Will transition back to higher home dose today as coagulapathy appears to have resolved.  Goal of Therapy: INR 2-3    Plan:  1. Coumadin 5 mg daily except 2.5mg  on Friday 2. INR MWF  Erin Hearing PharmD., BCPS Clinical Pharmacist Pager [phone] 04/03/2013 7:29 AM

## 2013-04-05 ENCOUNTER — Ambulatory Visit (INDEPENDENT_AMBULATORY_CARE_PROVIDER_SITE_OTHER): Payer: Medicare PPO | Admitting: Pharmacist

## 2013-04-05 DIAGNOSIS — Z7901 Long term (current) use of anticoagulants: Secondary | ICD-10-CM

## 2013-04-05 DIAGNOSIS — I4891 Unspecified atrial fibrillation: Secondary | ICD-10-CM

## 2013-04-05 LAB — POCT INR: INR: 2.7

## 2013-04-05 NOTE — Progress Notes (Signed)
Anti-Coagulation Progress Note  [name] is a 80 y.o. female who is currently on an anti-coagulation regimen.    RECENT RESULTS: Recent results are below, the most recent result is correlated with a dose of 35 mg. per week: Lab Results  Component Value Date   INR 2.70 04/05/2013   INR 2.62* 04/03/2013   INR 2.78* 04/02/2013    ANTI-COAG DOSE: Anticoagulation Dose Instructions as of 04/05/2013     Dorene Grebe Tue Wed Thu Fri Sat   New Dose 5 mg 2.5 mg 5 mg 5 mg 2.5 mg 5 mg 5 mg       ANTICOAG SUMMARY: Anticoagulation Episode Summary   Current INR goal 2.0-3.0  Next INR check 04/19/2013  INR from last check 2.70 (04/05/2013)  Weekly max dose   Target end date Indefinite  INR check location   Preferred lab   Send INR reminders to ANTICOAG IMP   Indications  Long-term (current) use of anticoagulants [V58.61] AF (atrial fibrillation) (Resolved) [427.31]        Comments       Anticoagulation Care Providers   Provider Role Specialty Phone number   Milta Deiters, MD  Internal Medicine [phone]      ANTICOAG TODAY: Anticoagulation Summary as of 04/05/2013   INR goal 2.0-3.0  Selected INR 2.70 (04/05/2013)  Next INR check 04/19/2013  Target end date Indefinite   Indications  Long-term (current) use of anticoagulants [V58.61] AF (atrial fibrillation) (Resolved) [427.31]      Anticoagulation Episode Summary   INR check location    Preferred lab    Send INR reminders to ANTICOAG IMP   Comments     Anticoagulation Care Providers   Provider Role Specialty Phone number   Milta Deiters, MD  Internal Medicine [phone]      PATIENT INSTRUCTIONS: Patient Instructions  Patient instructed to take medications as defined in the Anti-coagulation Track section of this encounter.  Patient instructed to take today's dose.  Patient verbalized understanding of these instructions.       FOLLOW-UP Return in 2 weeks (on 04/19/2013) for Follow up INR at 2:30PM.  Jorene Guest,  III Pharm.D., CACP

## 2013-04-05 NOTE — Patient Instructions (Addendum)
Patient instructed to take medications as defined in the Anti-coagulation Track section of this encounter.  Patient instructed to take today's dose.  Patient verbalized understanding of these instructions.    

## 2013-04-06 ENCOUNTER — Other Ambulatory Visit: Payer: Self-pay | Admitting: Internal Medicine

## 2013-04-07 NOTE — Discharge Summary (Signed)
  Date: 04/07/2013  Patient name: [name]  Medical record number: [id number]  Date of birth: September 26, 1932   This patient has been seen and the plan of care was discussed with the house staff. Please see their note for complete details. I concur with their findings with the following additions/corrections:  I was happy the patient was agreeable to engage with [facility]  Truman Hayward, MD 04/07/2013, 5:43 PM

## 2013-04-13 ENCOUNTER — Ambulatory Visit: Payer: Medicare PPO | Admitting: Internal Medicine

## 2013-04-13 ENCOUNTER — Ambulatory Visit: Payer: Medicare PPO

## 2013-04-19 ENCOUNTER — Ambulatory Visit: Payer: Medicare PPO

## 2013-04-25 ENCOUNTER — Other Ambulatory Visit: Payer: Self-pay | Admitting: Internal Medicine

## 2013-04-26 ENCOUNTER — Other Ambulatory Visit: Payer: Self-pay | Admitting: Internal Medicine

## 2013-04-26 DIAGNOSIS — N6489 Other specified disorders of breast: Secondary | ICD-10-CM

## 2013-04-26 DIAGNOSIS — C50911 Malignant neoplasm of unspecified site of right female breast: Secondary | ICD-10-CM

## 2013-04-28 ENCOUNTER — Ambulatory Visit: Payer: Medicare PPO

## 2013-04-29 ENCOUNTER — Encounter: Payer: Self-pay | Admitting: Internal Medicine

## 2013-04-29 ENCOUNTER — Ambulatory Visit (INDEPENDENT_AMBULATORY_CARE_PROVIDER_SITE_OTHER): Payer: Medicare PPO | Admitting: Internal Medicine

## 2013-04-29 VITALS — BP 145/67 | HR 65 | Temp 98.2°F | Wt 265.6 lb

## 2013-04-29 DIAGNOSIS — I5043 Acute on chronic combined systolic (congestive) and diastolic (congestive) heart failure: Secondary | ICD-10-CM

## 2013-04-29 DIAGNOSIS — Z7901 Long term (current) use of anticoagulants: Secondary | ICD-10-CM

## 2013-04-29 DIAGNOSIS — M109 Gout, unspecified: Secondary | ICD-10-CM

## 2013-04-29 DIAGNOSIS — I10 Essential (primary) hypertension: Secondary | ICD-10-CM

## 2013-04-29 DIAGNOSIS — N183 Chronic kidney disease, stage 3 unspecified: Secondary | ICD-10-CM

## 2013-04-29 DIAGNOSIS — I5032 Chronic diastolic (congestive) heart failure: Secondary | ICD-10-CM

## 2013-04-29 DIAGNOSIS — I5023 Acute on chronic systolic (congestive) heart failure: Secondary | ICD-10-CM

## 2013-04-29 DIAGNOSIS — J449 Chronic obstructive pulmonary disease, unspecified: Secondary | ICD-10-CM

## 2013-04-29 DIAGNOSIS — E119 Type 2 diabetes mellitus without complications: Secondary | ICD-10-CM

## 2013-04-29 DIAGNOSIS — E1142 Type 2 diabetes mellitus with diabetic polyneuropathy: Secondary | ICD-10-CM

## 2013-04-29 DIAGNOSIS — I509 Heart failure, unspecified: Secondary | ICD-10-CM

## 2013-04-29 DIAGNOSIS — E1149 Type 2 diabetes mellitus with other diabetic neurological complication: Secondary | ICD-10-CM

## 2013-04-29 DIAGNOSIS — I129 Hypertensive chronic kidney disease with stage 1 through stage 4 chronic kidney disease, or unspecified chronic kidney disease: Secondary | ICD-10-CM

## 2013-04-29 LAB — GLUCOSE, CAPILLARY: Glucose-Capillary: 199 mg/dL — ABNORMAL HIGH (ref 70–99)

## 2013-04-29 LAB — POCT GLYCOSYLATED HEMOGLOBIN (HGB A1C): Hemoglobin A1C: 7.1

## 2013-04-29 MED ORDER — ALLOPURINOL 150 MG HALF TABLET: ORAL_TABLET | ORAL | Status: DC

## 2013-04-29 NOTE — Assessment & Plan Note (Addendum)
Pt and Home Aide were not clear on discharge instructions to increase allopurinol to 300 mg daily (150 mg bid).  She was still taking 100 mg bid (200 mg daily). Per Up-to-date, for pt's with CKD 3 or less up-titration of allopurinol at 100 mg q2-5 weeks is recommended.  If CKD progresses to Stage 4, would recommend slower titration at 50 mg q2-5 weeks. Moderate erythema of right MTP.  Able to move without pain. Pharmacy contacted and prescription changed to 150 mg bid. Pt to cont colchicine.

## 2013-04-29 NOTE — Assessment & Plan Note (Signed)
Check BMET today. On Lasix 80 mg bid.

## 2013-04-29 NOTE — Progress Notes (Signed)
   Subjective:    Patient ID: [name], female    DOB: 1932-09-13, 80 y.o.   MRN: [id number]  HPI  Presents for post-hops f/u for CHF exac.  States that she has no shortness of breath, no chest pain, no LE edema.  States that her right big toe has improved but still a bit sore and red.  On review of Discharge Summary, pt was to be increased allopurinol to 300 mg per day but has only been taking 200 mg a day bc the summary was not updated before discharge.  Home health aide states that cbgs average 130s. No hypoglycemic episodes. Weight has been stable 265-267 in clinic although pt's home scale reads ar lower.  The [facility] states that pt is not steady on the scale thus she approximates the weight.  [facility] visited pt today and will continue monthly visits.  Review of Systems  Constitutional: Negative for fever and fatigue.  HENT: Negative.   Respiratory: Negative for cough and shortness of breath.   Cardiovascular: Negative for chest pain, palpitations and leg swelling.  Genitourinary: Negative for dysuria.  Musculoskeletal: Positive for arthralgias.       Right big toe soreness and red  Skin: Positive for color change. Negative for rash and wound.       Redness around right big toe  Neurological: Negative for dizziness and headaches.  Hematological: Does not bruise/bleed easily.  Psychiatric/Behavioral: Negative.        Objective:   Physical Exam  Constitutional: She is oriented to person, place, and time. She appears well-developed and well-nourished.  Morbidly obese, nasal canula in place, in wheelchair, Home Aide present  HENT:  Head: Normocephalic and atraumatic.  Eyes: Conjunctivae and EOM are normal. Pupils are equal, round, and reactive to light.  Neck: Normal range of motion. Neck supple. No thyromegaly present.  Cardiovascular: Normal rate, regular rhythm, normal heart sounds and intact distal pulses.   Pulmonary/Chest: Effort normal and breath sounds normal. No  respiratory distress. She has no wheezes.  Abdominal: Soft. Bowel sounds are normal. There is no tenderness.  Musculoskeletal: She exhibits no edema.       Right foot: She exhibits tenderness.       Feet:  Erythema of right MTP, pt able to actively move without significant discomfort, warm to the touch  Neurological: She is alert and oriented to person, place, and time.  Skin: Skin is warm and dry. There is erythema.  Psychiatric: She has a normal mood and affect.          Assessment & Plan:  See separate problem-list charting:

## 2013-04-29 NOTE — Patient Instructions (Addendum)
General Instructions:  We would like your to increase the allopurinol to 150 mg twice a day. You may break one of the 100mg  pill in half and take one and half pill twice a day (this will equal 150 mg) until that bottle is finished. When it is time for refill, you will only have to take one 150 mg pill twice a day.  Your weight is stable and we do not think that you have too much fluid on yourself today. Continue to take all your other medications as before. Return to clinic in 3 months.  Treatment Goals:  Goals (1 Years of Data) as of 04/29/13         As of Today 02/16/13 11/10/12 06/26/12 03/31/12     Result Component    . HEMOGLOBIN A1C < 8  7.1 7.3 6.7 6.4 7.0      Progress Toward Treatment Goals:  Treatment Goal 04/29/2013  Hemoglobin A1C unchanged  Blood pressure at goal  Prevent falls at goal    Self Care Goals & Plans:  Self Care Goal 03/24/2013  Manage my medications take my medicines as prescribed; bring my medications to every visit; refill my medications on time  Monitor my health keep track of my weight; check my feet daily  Eat healthy foods eat foods that are low in salt; eat more vegetables; eat fruit for snacks and desserts; eat baked foods instead of fried foods  Be physically active find an activity I enjoy    Home Blood Glucose Monitoring 04/29/2013  Check my blood sugar 2 times a day  When to check my blood sugar before meals     Care Management & Community Referrals:  No flowsheet data found.

## 2013-04-29 NOTE — Assessment & Plan Note (Signed)
Without exacerbation. On home O2 2L.Advair, Spiriva, and prn albuterol.

## 2013-04-29 NOTE — Assessment & Plan Note (Signed)
Exacerbation now resolved. Manged with Lasix 80 mg bid

## 2013-04-29 NOTE — Assessment & Plan Note (Addendum)
Lab Results  Component Value Date   HGBA1C 7.3 02/16/2013   HGBA1C 6.7 11/10/2012   HGBA1C 6.4 06/26/2012     Assessment: Diabetes control: fair control Progress toward A1C goal:  unchanged Comments: on review of cbg log, there were several lunch time elevated cbgs in ~300-400, home Health aide states that these were the days when pt had Meal on Wheels.  Plan: Medications:  continue current medications Home glucose monitoring: Frequency: 2 times a day Timing: before meals Instruction/counseling given: reminded to bring blood glucose meter & log to each visit and discussed foot care Educational resources provided:   Self management tools provided:   Other plans: cont glypizide10 mg bid

## 2013-04-29 NOTE — Assessment & Plan Note (Signed)
Without exacerbation today, no shortness of breath, chest pain, or LE edema.  Using 2L O2 [place]. On Lasix 80 mg bid. [facility] initiated with first visit today. To have f/u [facility] monthly.

## 2013-04-30 LAB — BASIC METABOLIC PANEL
BUN: 35 mg/dL — AB (ref 6–23)
CO2: 33 mEq/L — ABNORMAL HIGH (ref 19–32)
Calcium: 9.8 mg/dL (ref 8.4–10.5)
Chloride: 96 mEq/L (ref 96–112)
Creat: 1.16 mg/dL — ABNORMAL HIGH (ref 0.50–1.10)
GLUCOSE: 175 mg/dL — AB (ref 70–99)
POTASSIUM: 4.4 meq/L (ref 3.5–5.3)
Sodium: 137 mEq/L (ref 135–145)

## 2013-05-02 ENCOUNTER — Other Ambulatory Visit: Payer: Self-pay | Admitting: Internal Medicine

## 2013-05-03 ENCOUNTER — Telehealth: Payer: Self-pay | Admitting: Cardiology

## 2013-05-03 NOTE — Telephone Encounter (Signed)
Made aware

## 2013-05-03 NOTE — Telephone Encounter (Signed)
New message    [facility].    Upper left chest pian since yesterday - dual .     Pain scales  5-6 . Has not had pain before.     B/p 134/80 , in left arm. Heart rate 62 ,  93 % . resp  24

## 2013-05-03 NOTE — Telephone Encounter (Signed)
Spoke with pt's home health nurse--Elaine. [name] reports she saw pt this morning and pt was complaining of chest pain rated as 5-6 on 1-10 scale. Pt also with back and leg pain but this is chronic for her.  Pain comes and goes. Pt has not taken anything for this pain. Pt is short of breath with any exertion but this is not new.  Home Health nurse is currently not with pt but pt has aide with her. I have instructed [name] pt should go to ED. Pt will go by EMS.  [name] will contact pt with these instructions.

## 2013-05-05 NOTE — Progress Notes (Signed)
Case discussed with Dr. Schooler at the time of the visit.  We reviewed the resident's history and exam and pertinent patient test results.  I agree with the assessment, diagnosis, and plan of care documented in the resident's note.     

## 2013-05-08 ENCOUNTER — Other Ambulatory Visit: Payer: Self-pay | Admitting: Internal Medicine

## 2013-05-10 ENCOUNTER — Encounter (INDEPENDENT_AMBULATORY_CARE_PROVIDER_SITE_OTHER): Payer: Medicare PPO | Admitting: Ophthalmology

## 2013-05-10 ENCOUNTER — Telehealth: Payer: Self-pay | Admitting: Internal Medicine

## 2013-05-10 DIAGNOSIS — H353290 Exudative age-related macular degeneration, unspecified eye, stage unspecified: Secondary | ICD-10-CM

## 2013-05-10 DIAGNOSIS — H43819 Vitreous degeneration, unspecified eye: Secondary | ICD-10-CM

## 2013-05-10 DIAGNOSIS — E1165 Type 2 diabetes mellitus with hyperglycemia: Secondary | ICD-10-CM

## 2013-05-10 DIAGNOSIS — E1139 Type 2 diabetes mellitus with other diabetic ophthalmic complication: Secondary | ICD-10-CM

## 2013-05-10 DIAGNOSIS — H3530 Unspecified macular degeneration: Secondary | ICD-10-CM

## 2013-05-10 DIAGNOSIS — E11319 Type 2 diabetes mellitus with unspecified diabetic retinopathy without macular edema: Secondary | ICD-10-CM

## 2013-05-10 NOTE — Telephone Encounter (Signed)
review 

## 2013-05-10 NOTE — Telephone Encounter (Signed)
  INTERNAL MEDICINE RESIDENCY PROGRAM After-Hours Telephone Call    Reason for call:   I placed an outgoing call to [name] at 730pm regarding confusion. Patient son states that patient became confused two days ago. No other associated symptoms.  Denies fever, chills, chest discomfort, N/V, abd pain. Of note, she has had hx of acute encephalopathy related to UTI.      Assessment/ Plan:   Acute encephalopathy, etiology is unclear.       Patient should be evaluated by a provider for the acute encephalopathy. I instructed her son to bring her to the ED for further evaluation and management. Understands and agrees with the plan.   As always, pt is advised that if symptoms worsen or new symptoms arise, they should go to an urgent care facility or to to ER for further evaluation.    [name] Lange, MD   05/10/2013, 7:26 PM

## 2013-05-11 ENCOUNTER — Telehealth: Payer: Self-pay | Admitting: *Deleted

## 2013-05-11 ENCOUNTER — Encounter: Payer: Self-pay | Admitting: Cardiology

## 2013-05-11 ENCOUNTER — Ambulatory Visit (INDEPENDENT_AMBULATORY_CARE_PROVIDER_SITE_OTHER): Payer: Medicare PPO | Admitting: Cardiology

## 2013-05-11 VITALS — BP 148/76 | HR 68 | Ht 62.0 in | Wt 254.0 lb

## 2013-05-11 DIAGNOSIS — I509 Heart failure, unspecified: Secondary | ICD-10-CM

## 2013-05-11 DIAGNOSIS — I10 Essential (primary) hypertension: Secondary | ICD-10-CM

## 2013-05-11 DIAGNOSIS — I5032 Chronic diastolic (congestive) heart failure: Secondary | ICD-10-CM

## 2013-05-11 DIAGNOSIS — I4891 Unspecified atrial fibrillation: Secondary | ICD-10-CM

## 2013-05-11 DIAGNOSIS — E785 Hyperlipidemia, unspecified: Secondary | ICD-10-CM

## 2013-05-11 DIAGNOSIS — R0602 Shortness of breath: Secondary | ICD-10-CM

## 2013-05-11 DIAGNOSIS — I359 Nonrheumatic aortic valve disorder, unspecified: Secondary | ICD-10-CM

## 2013-05-11 DIAGNOSIS — I350 Nonrheumatic aortic (valve) stenosis: Secondary | ICD-10-CM

## 2013-05-11 DIAGNOSIS — I2510 Atherosclerotic heart disease of native coronary artery without angina pectoris: Secondary | ICD-10-CM

## 2013-05-11 LAB — BASIC METABOLIC PANEL
BUN: 29 mg/dL — ABNORMAL HIGH (ref 6–23)
CO2: 34 mEq/L — ABNORMAL HIGH (ref 19–32)
CREATININE: 1.1 mg/dL (ref 0.4–1.2)
Calcium: 9.9 mg/dL (ref 8.4–10.5)
Chloride: 95 mEq/L — ABNORMAL LOW (ref 96–112)
GFR: 49.37 mL/min — ABNORMAL LOW (ref >60.00)
GLUCOSE: 137 mg/dL — AB (ref 70–99)
POTASSIUM: 4 meq/L (ref 3.5–5.1)
Sodium: 137 mEq/L (ref 135–145)

## 2013-05-11 LAB — BRAIN NATRIURETIC PEPTIDE: Pro B Natriuretic peptide (BNP): 423 pg/mL — ABNORMAL HIGH (ref 0.0–100.0)

## 2013-05-11 NOTE — Patient Instructions (Signed)
Your physician recommends that you continue on your current medications as directed. Please refer to the Current Medication list given to you today.  Your physician recommends that you go to the lab today for BMET and BNP  Your physician has requested that you have an echocardiogram. Echocardiography is a painless test that uses sound waves to create images of your heart. It provides your doctor with information about the size and shape of your heart and how well your heart's chambers and valves are working. This procedure takes approximately one hour. There are no restrictions for this procedure.   Your physician recommends that you schedule a follow-up appointment in: 3 Months with Dr Radford Pax

## 2013-05-11 NOTE — Telephone Encounter (Signed)
Call from pt's son asking for appointment.  He called in last night and talked with Dr Nicoletta Dress.  He is concerned with pt being confused for 2 days.  No other c/o. Hx: acute encephalopathy related to UTI.  I offered an appointment this AM and PM.  Son states he can't come in this morning and pt has an appointment with Cardiologist at 3:00 today. Pt scheduled for clinic visit tomorrow at 1000. I advised son that pt should be seen asap, if any changes please bring pt to ED for evaluation.  # [phone]

## 2013-05-11 NOTE — Telephone Encounter (Signed)
Would like to see patient today, but if that is the best the family can do then we will see her tomorrow.  I agree with recommendation to go immediately to the ED if she should worsen.

## 2013-05-11 NOTE — Progress Notes (Signed)
[facility], [address] [place], [place]  [address] Phone: [phone] Fax:  [phone]  Date:  05/11/2013   ID:  [name], DOB 1933/02/20, MRN [id number]  PCP:  Madilyn Fireman, MD  Cardiologist:  Fransico Him, MD     History of Present Illness: [name] is a 80 y.o. female with a history of HTN, moderate AS, atrial fibrillation, nonobstructive ASCAD, dyslipidemia, chronic diastolic CHF who presents today for followup. She was recently hospitalized for a short time for UTI and CHF exacerbation. She was diuresed and sent home on her usual diuretic dose. She was then readmitted in early February with acute on chronic systolic CHF and was diuresed. Her home health nurse recently called in saying that patient was having 5-6 CP along with chronic back and leg pain which is chronic for her.  She has chronic DOE. She presents today without any complaints.  She says that her CP has completely resolved.  She had the pain a few times in one day and then it resolved.  It was a pressure over her left breast with no radiation.  The pain lasted a few seconds several times and resolved never to return.  She denies any LE edema.  She has chronic DOE which is stable.  She denies any palpitations, LE edema or syncope.  Wt Readings from Last 3 Encounters:  05/11/13 254 lb (115.214 kg)  04/29/13 265 lb 9.6 oz (120.475 kg)  04/03/13 267 lb 10.2 oz (121.4 kg)     Past Medical History  Diagnosis Date  . Abdominal pain     Likely secondary to presbyesophagus and gastric dysmotility  . Chronic diastolic heart failure     June 2014 echo with EF 55-60% without wall motion abd but mod to severe AS  . Respiratory failure, chronic     Mixed etiology with bronchospastic component  . Urinary incontinence     Recurrent uti's/resistance to cipro, bactrim  . Gout     "I keep the gout; on 2 RX for it too" (03/31/2013)  . Postablative hypothyroidism     H/o Graves disease s/p radioactive iodine  ablation with resultant postablative hypothyroidism  . Hyperlipidemia   . Depression   . Morbidly obese     s/p gastric plication surgery  . Gastroesophageal reflux disease   . Ventral hernia     Repair in April 8182, complicated by MRSA abdominal wall cellulitis  . DVT (deep venous thrombosis) 1998  . Pulmonary embolism 1998    [place] filter  . Essential hypertension, benign   . AF (atrial fibrillation)     on chronic warfarin  . Stroke 1997    Denies residual  . Anemia     Blood transfusion [V58.2]  . Supratherapeutic INR 05/15/2012    Possibly induced by steroid use.   . Chest pain at rest 08/07/2012  . COPD (chronic obstructive pulmonary disease)   . Small bowel obstruction     intermittent  . CKD (chronic kidney disease), stage III   . Moderate aortic stenosis 07/2012    by ECHO with peak AV velocity 3cm/sec and mean AVG 23 mmhg  . Coronary atherosclerosis of native coronary artery     30-40% stenosis of left circ  . Type II diabetes mellitus   . Chronic diastolic CHF (congestive heart failure)     "quite a few times" (03/31/2013)  . Asthma   . Pneumonia     "have had it quite a few times" (  03/31/2013)  . Chronic bronchitis     "most q yr" (03/31/2013)  . Shortness of breath     "all the time" (03/31/2013)  . On home oxygen therapy     "3L; 24/7" (03/31/2013)  . Graves' disease   . History of blood transfusion     "related to surgery; D&C"  . History of gastric ulcer   . Seizures 1990's    "once"  . Myocardial infarction 1980's    "one"  . Osteoarthritis     "all over" (03/31/2013)  . Frequent UTI   . Macular degeneration of both eyes     "blind in left eye; getting drops in right" (03/31/2013)    Current Outpatient Prescriptions  Medication Sig Dispense Refill  . acetaminophen (TYLENOL) 500 MG tablet Take 500-1,000 mg by mouth 2 (two) times daily as needed (pain).       Marland Kitchen albuterol (PROVENTIL HFA;VENTOLIN HFA) 108 (90 BASE) MCG/ACT inhaler Inhale 2 puffs into the  lungs every 6 (six) hours as needed for wheezing or shortness of breath.  1 Inhaler  2  . allopurinol (ZYLOPRIM) 100 MG tablet TAKE ONE TABLET BY MOUTH TWICE DAILY  60 tablet  0  . aspirin EC 81 MG EC tablet Take 1 tablet (81 mg total) by mouth daily.      Marland Kitchen atorvastatin (LIPITOR) 40 MG tablet Take 40 mg by mouth daily.      . colchicine 0.6 MG tablet Take 0.6 mg by mouth See admin instructions. Take for 3 days when gout flares up      . diphenhydrAMINE (BENADRYL) 25 MG tablet Take 50 mg by mouth 3 (three) times daily.      Marland Kitchen FLUoxetine (PROZAC) 20 MG capsule TAKE ONE CAPSULE BY MOUTH TWICE DAILY  180 capsule  0  . Fluticasone-Salmeterol (ADVAIR) 250-50 MCG/DOSE AEPB Inhale 1 puff into the lungs 2 (two) times daily.      . furosemide (LASIX) 80 MG tablet Take 1 tablet (80 mg total) by mouth 2 (two) times daily.  180 tablet  3  . gabapentin (NEURONTIN) 100 MG capsule Take 2 capsules (200 mg total) by mouth 2 (two) times daily.  120 capsule  11  . glipiZIDE (GLUCOTROL) 10 MG tablet TAKE ONE TABLET BY MOUTH TWICE DAILY BEFORE MEALS  60 tablet  5  . glucose blood (ONE TOUCH ULTRA TEST) test strip Check blood sugar 3x a day dx code 250.00  100 each  12  . levothyroxine (SYNTHROID, LEVOTHROID) 150 MCG tablet TAKE ONE TABLET BY MOUTH EVERY DAY  90 tablet  3  . loperamide (IMODIUM) 2 MG capsule Take 1 capsule (2 mg total) by mouth as needed for diarrhea or loose stools (up to total 16mg  daily).  8 capsule  0  . omeprazole (PRILOSEC) 20 MG capsule TAKE TWO CAPSULES BY MOUTH ONCE DAILY  60 capsule  0  . ONETOUCH DELICA LANCETS FINE MISC 1 each by Does not apply route 3 (three) times daily. Dx code 250.00  100 each  12  . oxymetazoline (AFRIN NASAL SPRAY) 0.05 % nasal spray Place 1 spray into both nostrils as needed. Use only if you have a nose bleed.  30 mL  0  . polyethylene glycol (MIRALAX / GLYCOLAX) packet Take 17 g by mouth every morning.      . tiotropium (SPIRIVA) 18 MCG inhalation capsule Place 18  mcg into inhaler and inhale daily.      Marland Kitchen warfarin (COUMADIN) 5 MG tablet TAKE ONE  TABLET BY MOUTH ONCE DAILY AT 6 PM. EXCEPT ON MONDAYS TAKE 1.5 TABLET( 7.5 MG)  32 tablet  4   No current facility-administered medications for this visit.    Allergies:    Allergies  Allergen Reactions  . Copper-Containing Compounds Other (See Comments)    Per doctor told her not take meds containing copper  . Lantus [Insulin Glargine]     Causes a lot of itching  . Nitrofurantoin [Macrodantin] Itching    Social History:  The patient  reports that she quit smoking about 45 years ago. Her smoking use included Cigarettes. She has a 1.5 pack-year smoking history. She has never used smokeless tobacco. She reports that she drinks alcohol. She reports that she does not use illicit drugs.   Family History:  The patient's family history is negative for Colon cancer.   ROS:  Please see the history of present illness.      All other systems reviewed and negative.   PHYSICAL EXAM: VS:  BP 148/76  Pulse 68  Ht 5\' 2"  (1.575 m)  Wt 254 lb (115.214 kg)  BMI 46.45 kg/m2  LMP 05/22/1968 Well nourished, well developed, in no acute distress HEENT: normal Neck: no JVD Cardiac:  normal S1, S2; RRR; 2/6 SM at RUSB to LLSB Lungs:  clear to auscultation bilaterally, no wheezing, rhonchi or rales Abd: soft, nontender, no hepatomegaly Ext: no edema Skin: warm and dry Neuro:  CNs 2-12 intact, no focal abnormalities noted   ASSESSMENT AND PLAN:  1.  Chronic diastolic CHF - appears compensated - continue Lasix  - Check BMET and BNP 2.  HTN well controlled 3.  Moderate AS - repeat echo to make sure AS has not worsened 4.  Nonobstructive ASCAD - she had 1 episode of atypical CP that only lasted a few seconds and was nonexertional.  She has not had any further episodes.  - continue ASA  5.  Chronic Atrial fibrillation - rate controlled - continue Warfarin   Followup with me in 3 months   Signed, Fransico Him,  MD 05/11/2013 3:19 PM

## 2013-05-12 ENCOUNTER — Ambulatory Visit (INDEPENDENT_AMBULATORY_CARE_PROVIDER_SITE_OTHER): Payer: Medicare PPO | Admitting: Internal Medicine

## 2013-05-12 ENCOUNTER — Other Ambulatory Visit: Payer: Self-pay | Admitting: General Surgery

## 2013-05-12 ENCOUNTER — Encounter: Payer: Self-pay | Admitting: Internal Medicine

## 2013-05-12 VITALS — BP 108/78 | HR 48 | Temp 98.4°F | Ht 62.0 in | Wt 260.0 lb

## 2013-05-12 DIAGNOSIS — F29 Unspecified psychosis not due to a substance or known physiological condition: Secondary | ICD-10-CM

## 2013-05-12 DIAGNOSIS — R410 Disorientation, unspecified: Secondary | ICD-10-CM | POA: Insufficient documentation

## 2013-05-12 DIAGNOSIS — I5032 Chronic diastolic (congestive) heart failure: Secondary | ICD-10-CM

## 2013-05-12 LAB — GLUCOSE, CAPILLARY: Glucose-Capillary: 145 mg/dL — ABNORMAL HIGH (ref 70–99)

## 2013-05-12 NOTE — Patient Instructions (Signed)
  Please keep your appointment with your Primary Care Physician in April. We want to be sure that you are not developing another infection that can cause confusion.  Please bring your medicines with you each time you come.   Medicines may be  Eye drops  Herbal   Vitamins  Pills  Seeing these help [place] take care of you.
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
## 2013-05-17 NOTE — Assessment & Plan Note (Addendum)
It is unclear of the particulars of the complaint as the son is not present, the [place] can not contribute helpful information, and the patient denies any confusion.  On exam today and at my most recent exam last week, pt is clear, alert, and appropriate.  She declines allowing [place] to test her urine or blood as she states that she feels "fine" and no longer has dysuria.  She is quite offended that her son called the clinic.  I did try to express his likely concern for her well being.  On follow-up would advise further assessment of her hearing and the son to accompany the patient for further information.  He is not available today per telephone.  Also note that, pt was evaluated by Dr. Radford Pax of Cardiology the day following the reports of confusion and no confusion was reported not noted.  -cont to monitor, no new meds or change in meds recently -pt had recent eye exam but will need another provider as she has been dismissed -consider formal hearing test although pt without difficulty hearing examiner today and I believe the Home Aide's accent may be contributing to the 'confusion' and possible frustration

## 2013-05-19 NOTE — Progress Notes (Signed)
Case discussed with Dr. Schooler soon after the resident saw the patient.  We reviewed the resident's history and exam and pertinent patient test results.  I agree with the assessment, diagnosis, and plan of care documented in the resident's note. 

## 2013-05-25 ENCOUNTER — Ambulatory Visit (INDEPENDENT_AMBULATORY_CARE_PROVIDER_SITE_OTHER): Payer: Medicare PPO

## 2013-05-25 ENCOUNTER — Ambulatory Visit: Payer: Medicare PPO

## 2013-05-25 VITALS — BP 108/64 | HR 66 | Resp 12

## 2013-05-25 DIAGNOSIS — B351 Tinea unguium: Secondary | ICD-10-CM

## 2013-05-25 DIAGNOSIS — E1140 Type 2 diabetes mellitus with diabetic neuropathy, unspecified: Secondary | ICD-10-CM

## 2013-05-25 DIAGNOSIS — M79609 Pain in unspecified limb: Secondary | ICD-10-CM

## 2013-05-25 NOTE — Patient Instructions (Signed)
Diabetes and Foot Care Diabetes may cause you to have problems because of poor blood supply (circulation) to your feet and legs. This may cause the skin on your feet to become thinner, break easier, and heal more slowly. Your skin may become dry, and the skin may peel and crack. You may also have nerve damage in your legs and feet causing decreased feeling in them. You may not notice minor injuries to your feet that could lead to infections or more serious problems. Taking care of your feet is one of the most important things you can do for yourself.  HOME CARE INSTRUCTIONS  Wear shoes at all times, even in the house. Do not go barefoot. Bare feet are easily injured.  Check your feet daily for blisters, cuts, and redness. If you cannot see the bottom of your feet, use a mirror or ask someone for help.  Wash your feet with warm water (do not use hot water) and mild soap. Then pat your feet and the areas between your toes until they are completely dry. Do not soak your feet as this can dry your skin.  Apply a moisturizing lotion or petroleum jelly (that does not contain alcohol and is unscented) to the skin on your feet and to dry, brittle toenails. Do not apply lotion between your toes.  Trim your toenails straight across. Do not dig under them or around the cuticle. File the edges of your nails with an emery board or nail file.  Do not cut corns or calluses or try to remove them with medicine.  Wear clean socks or stockings every day. Make sure they are not too tight. Do not wear knee-high stockings since they may decrease blood flow to your legs.  Wear shoes that fit properly and have enough cushioning. To break in new shoes, wear them for just a few hours a day. This prevents you from injuring your feet. Always look in your shoes before you put them on to be sure there are no objects inside.  Do not cross your legs. This may decrease the blood flow to your feet.  If you find a minor scrape,  cut, or break in the skin on your feet, keep it and the skin around it clean and dry. These areas may be cleansed with mild soap and water. Do not cleanse the area with peroxide, alcohol, or iodine.  When you remove an adhesive bandage, be sure not to damage the skin around it.  If you have a wound, look at it several times a day to make sure it is healing.  Do not use heating pads or hot water bottles. They may burn your skin. If you have lost feeling in your feet or legs, you may not know it is happening until it is too late.  Make sure your health care provider performs a complete foot exam at least annually or more often if you have foot problems. Report any cuts, sores, or bruises to your health care provider immediately. SEEK MEDICAL CARE IF:   You have an injury that is not healing.  You have cuts or breaks in the skin.  You have an ingrown nail.  You notice redness on your legs or feet.  You feel burning or tingling in your legs or feet.  You have pain or cramps in your legs and feet.  Your legs or feet are numb.  Your feet always feel cold. SEEK IMMEDIATE MEDICAL CARE IF:   There is increasing redness,   swelling, or pain in or around a wound.  There is a red line that goes up your leg.  Pus is coming from a wound.  You develop a fever or as directed by your health care provider.  You notice a bad smell coming from an ulcer or wound. Document Released: 02/09/2000 Document Revised: 10/14/2012 Document Reviewed: 07/21/2012 [employer] Patient Information 2014 [employer].  

## 2013-05-25 NOTE — Progress Notes (Signed)
   Subjective:    Patient ID: [name], female    DOB: 06/27/1932, 80 y.o.   MRN: [id number]  HPI patient presents this time for diabetic foot mycotic nail care, patient is a wheelchair nonambulatory unable to transfer at this time into exam chair    Review of Systems  Constitutional: Positive for activity change and fatigue.  HENT: Negative.   Eyes: Negative.   Respiratory: Negative.   Cardiovascular: Positive for leg swelling.  Gastrointestinal: Negative.   Endocrine: Negative.   Genitourinary: Positive for difficulty urinating.  Musculoskeletal: Positive for arthralgias and gait problem.  Skin: Negative.   Allergic/Immunologic: Negative.   Neurological: Positive for numbness.  Hematological: Negative.   Psychiatric/Behavioral: Negative.        Objective:   Physical Exam Vascular status is intact as follows pedal pulses palpable DP postal for PT one over 4 bilateral capillary refill time 3 seconds all digits skin temperature warm to cool turgor diminished there is +1 edema no rubor pallor or varicosities noted. Neurologically epicritic and proprioceptive sensations diminished on Semmes Weinstein testing to the digits plantar forefoot arch there is no otherwise normal plantar response DTRs not elicited dermatologically skin color pigment normal hair growth absent nails criptotic incurvated friable yellowed and white with brittleness discoloration tenderness on palpation and attempted debridement 1 through 4 bilateral. No open wounds ulcerations no secondary infection is noted current time.       Assessment & Plan:  Assessment this time is diabetes with peripheral neuropathy and complications patient also is vascular compromise centimeter in a wheelchair this time thick painful dystrophic mycotic nails 1 through 4 bilateral debrided and the presence of pain in symptomology as well as onychomycosis is as well as her diabetes. Return in 3 months for continued palliative care is  needed  Harriet Masson DPM

## 2013-05-26 ENCOUNTER — Ambulatory Visit (HOSPITAL_COMMUNITY): Payer: Medicare PPO | Attending: Cardiology | Admitting: Radiology

## 2013-05-26 DIAGNOSIS — I350 Nonrheumatic aortic (valve) stenosis: Secondary | ICD-10-CM

## 2013-05-26 DIAGNOSIS — I359 Nonrheumatic aortic valve disorder, unspecified: Secondary | ICD-10-CM | POA: Insufficient documentation

## 2013-05-26 NOTE — Progress Notes (Signed)
Echocardiogram performed.  

## 2013-05-27 ENCOUNTER — Encounter (HOSPITAL_COMMUNITY): Payer: Self-pay | Admitting: *Deleted

## 2013-05-27 ENCOUNTER — Telehealth: Payer: Self-pay | Admitting: *Deleted

## 2013-05-27 ENCOUNTER — Ambulatory Visit (HOSPITAL_COMMUNITY)
Admission: RE | Admit: 2013-05-27 | Discharge: 2013-05-27 | Disposition: A | Discharge status: Home / Self Care | Payer: Medicare PPO | Source: Ambulatory Visit | Attending: Internal Medicine | Admitting: Internal Medicine

## 2013-05-27 VITALS — BP 114/62 | HR 56 | Wt 257.0 lb

## 2013-05-27 DIAGNOSIS — I5032 Chronic diastolic (congestive) heart failure: Secondary | ICD-10-CM

## 2013-05-27 DIAGNOSIS — J9610 Chronic respiratory failure, unspecified whether with hypoxia or hypercapnia: Secondary | ICD-10-CM | POA: Insufficient documentation

## 2013-05-27 DIAGNOSIS — I4891 Unspecified atrial fibrillation: Secondary | ICD-10-CM | POA: Insufficient documentation

## 2013-05-27 DIAGNOSIS — I509 Heart failure, unspecified: Secondary | ICD-10-CM

## 2013-05-27 DIAGNOSIS — I350 Nonrheumatic aortic (valve) stenosis: Secondary | ICD-10-CM

## 2013-05-27 DIAGNOSIS — I359 Nonrheumatic aortic valve disorder, unspecified: Secondary | ICD-10-CM | POA: Insufficient documentation

## 2013-05-27 MED ORDER — POTASSIUM CHLORIDE CRYS ER 20 MEQ PO TBCR: 20.0000 meq | EXTENDED_RELEASE_TABLET | Freq: Every day | ORAL | Status: AC

## 2013-05-27 MED ORDER — TORSEMIDE 20 MG PO TABS: 80.0000 mg | ORAL_TABLET | Freq: Two times a day (BID) | ORAL | Status: AC

## 2013-05-27 NOTE — Telephone Encounter (Signed)
Dr Ellwood Dense left a note to tell pt to call her cardiologist Dr Radford Pax about echo results. Pt aware. Hilda Blades Yuktha Kerchner RN 05/27/13 1:30PM

## 2013-05-27 NOTE — Patient Instructions (Addendum)
Stop Aspirin  Start Torsemide 80 mg (4 tabs) Twice daily   Start Potassium (k-dur) 20 meq daily  Your physician recommends that you schedule a follow-up appointment in: 2 weeks \  Your physician has requested that you have a TEE. During a TEE, sound waves are used to create images of your heart. It provides your doctor with information about the size and shape of your heart and how well your heart's chambers and valves are working. In this test, a transducer is attached to the end of a flexible tube that's guided down your throat and into your esophagus (the tube leading from you mouth to your stomach) to get a more detailed image of your heart. You are not awake for the procedure. Please see the instruction sheet given to you today. For further information please visit [email or web address].

## 2013-05-28 ENCOUNTER — Other Ambulatory Visit (HOSPITAL_COMMUNITY): Payer: Self-pay | Admitting: Anesthesiology

## 2013-05-28 DIAGNOSIS — I350 Nonrheumatic aortic (valve) stenosis: Secondary | ICD-10-CM

## 2013-05-28 NOTE — Progress Notes (Signed)
Patient ID: [name], female   DOB: 30-Nov-1932, 80 y.o.   MRN: [id number] PCP: Dr. Ellwood Dense Referring MD: Dr. Radford Pax  80 yo with history of chronic diastolic CHF and probable severe AS, chronic respiratory failure on home oxygen likely due to OHS/OSA, and chronic atrial fibrillation presents for CHF clinic evaluation.  She has had a number of hospitalizations over the last year involving volume overload.  She lives with her son and has a nursing aide that stays with her during the day. She is not very active.  She is able to walk in her house with her walker but is short of breath walking from the living room to the bathroom. She has had significant dyspnea and has been on oxygen now for several years.  She was never a heavy smoker so suspect OHS plays a role here.  No chest pain.  +Orthopnea, she sleeps propped on several pillows.  No PND.  +Bendopnea.  Recent echo showed normal LV EF, diastolic dysfunction, and probably severe aortic stenosis. She is on warfarin for chronic atrial fibrillation.   Labs (3/15): K 4, creatinine 1.1, BNP 423  PMH: 1. Gastric dysmotility 2. Chronic diastolic CHF: Multiple exacerbations.  Echo (4/15) with EF 55-60%, moderate LVH with restrictive diastolic function, moderate to severe AS with mean gradient 38 mmHg and AVA 0.77 cm^2, mild MR, PA systolic pressure 48 mmHg.   3. Aortic stenosis: See above, probably severe. 4. Graves disease s/p RAI, now hypothyroid. 5. Gout 6. Hyperlipidemia 7. Recurrent UTIs 8. Depression 9. HTN  10. Morbidly obese s/p gastric plication. 11. GERD 12. Ventral hernia 13. DVT with PE in 1998 14. CVA 1997 15. Chronic atrial fibrillation 16. Suspect OHS/OSA: Chronic respiratory failure on home oxygen.  81. CAD: LHC in 2006 with mild CAD.  SH: Prior smoker, quit 45 yrs ago after only a few years.  Lives with son but has caretaker during day while he is at work.  FH: Parents with MIs, not sure what age.  ROS: All systems  reviewed and negative except as per HPI.   Current Outpatient Prescriptions  Medication Sig Dispense Refill  . acetaminophen (TYLENOL) 500 MG tablet Take 500-1,000 mg by mouth 2 (two) times daily as needed (pain).       Marland Kitchen albuterol (PROVENTIL HFA;VENTOLIN HFA) 108 (90 BASE) MCG/ACT inhaler Inhale 2 puffs into the lungs every 6 (six) hours as needed for wheezing or shortness of breath.  1 Inhaler  2  . allopurinol (ZYLOPRIM) 100 MG tablet TAKE ONE TABLET BY MOUTH TWICE DAILY  60 tablet  0  . atorvastatin (LIPITOR) 40 MG tablet Take 40 mg by mouth daily.      . colchicine 0.6 MG tablet Take 0.6 mg by mouth See admin instructions. Take for 3 days when gout flares up      . diphenhydrAMINE (BENADRYL) 25 MG tablet Take 50 mg by mouth 3 (three) times daily.      Marland Kitchen FLUoxetine (PROZAC) 20 MG capsule TAKE ONE CAPSULE BY MOUTH TWICE DAILY  180 capsule  0  . Fluticasone-Salmeterol (ADVAIR) 250-50 MCG/DOSE AEPB Inhale 1 puff into the lungs 2 (two) times daily.      Marland Kitchen gabapentin (NEURONTIN) 100 MG capsule Take 2 capsules (200 mg total) by mouth 2 (two) times daily.  120 capsule  11  . glipiZIDE (GLUCOTROL) 10 MG tablet TAKE ONE TABLET BY MOUTH TWICE DAILY BEFORE MEALS  60 tablet  5  . glucose blood (ONE TOUCH ULTRA TEST) test  strip Check blood sugar 3x a day dx code 250.00  100 each  12  . levothyroxine (SYNTHROID, LEVOTHROID) 150 MCG tablet TAKE ONE TABLET BY MOUTH EVERY DAY  90 tablet  3  . loperamide (IMODIUM) 2 MG capsule Take 1 capsule (2 mg total) by mouth as needed for diarrhea or loose stools (up to total 16mg  daily).  8 capsule  0  . omeprazole (PRILOSEC) 20 MG capsule TAKE TWO CAPSULES BY MOUTH ONCE DAILY  60 capsule  0  . ONETOUCH DELICA LANCETS FINE MISC 1 each by Does not apply route 3 (three) times daily. Dx code 250.00  100 each  12  . oxymetazoline (AFRIN NASAL SPRAY) 0.05 % nasal spray Place 1 spray into both nostrils as needed. Use only if you have a nose bleed.  30 mL  0  . polyethylene  glycol (MIRALAX / GLYCOLAX) packet Take 17 g by mouth every morning.      . tiotropium (SPIRIVA) 18 MCG inhalation capsule Place 18 mcg into inhaler and inhale daily.      Marland Kitchen warfarin (COUMADIN) 5 MG tablet TAKE ONE TABLET BY MOUTH ONCE DAILY AT 6 PM. EXCEPT ON MONDAYS TAKE 1.5 TABLET( 7.5 MG)  32 tablet  4  . potassium chloride SA (K-DUR,KLOR-CON) 20 MEQ tablet Take 1 tablet (20 mEq total) by mouth daily.  30 tablet  3  . torsemide (DEMADEX) 20 MG tablet Take 4 tablets (80 mg total) by mouth 2 (two) times daily.  320 tablet  3   No current facility-administered medications for this encounter.    BP 114/62  Pulse 56  Wt 257 lb (116.574 kg)  SpO2 91%  LMP 05/22/1968 General: NAD, obese, has on oxygen.  Neck: Thick, JVP difficult but probably mildly elevated 8-9 cm, no thyromegaly or thyroid nodule.  Lungs: Clear to auscultation bilaterally with normal respiratory effort. CV: Nondisplaced PMI.  Heart irregular S1/S2, no S3/S4, 3/6 crescendo-descrescendo murmur RUSB with radiation to neck.  S2 is considerably decreased.  No peripheral edema.  Difficult to palpate pedal pulses..  Abdomen: Soft, nontender, no hepatosplenomegaly, no distention.  Skin: Intact without lesions or rashes.  Neurologic: Alert and oriented x 3.  Psych: Normal affect. Extremities: No clubbing or cyanosis.  HEENT: Normal.   Assessment/plan: 1. Chronic diastolic CHF: Patient has had difficult to manage diastolic CHF.  She is likely at least somewhat volume overloaded currently though exam is difficult.  She had restrictive diastolic function on last echo.  I am concerned that she may have severe aortic stenosis which may be potentiating her CHF symptoms.   - Stop Lasix and start torsemide 80 mg daily with BMET/BNP in 2 wks as well as followup appointment. Continue KCl 20 daily.  2. Aortic stenosis: Based on exam and her last echo, I am concerned that her AS may be severe.  Echo showed AVA 0.77 cm^2 with mean gradient 38  mmHg.  S2 is considerably decreased on exam.  If she does have severe AS, she would be a poor surgical AVR candidate given her multiple comorbidities.  However, TAVR may be an option.  - I am going to arrange for TEE to more fully assess AS.  If severe, will arrange for evaluation in valve clinic.  3. Atrial fibrillation: Chronic.  Reasonable rate control without nodal blockade.  Continue coumadin.  4. Chronic respiratory failure: Suspect OHS with some role from diastolic CHF.  She has been on home oxygen.  I do not see where she has  been seeing pulmonary.  Would likely be reasonable to get a pulmonary evaluation.  Doubt COPD as she has a minimal smoking history.  5. CAD: Minimal on prior cath.  As she is on coumadin for atrial fibrillation, she can stop ASA 81.   Loralie Champagne 05/28/2013

## 2013-05-31 ENCOUNTER — Ambulatory Visit (HOSPITAL_COMMUNITY)
Admission: RE | Admit: 2013-05-31 | Discharge: 2013-05-31 | Disposition: A | Discharge status: Home / Self Care | Payer: Medicare PPO | Source: Ambulatory Visit | Attending: Cardiology | Admitting: Cardiology

## 2013-05-31 ENCOUNTER — Encounter (HOSPITAL_COMMUNITY): Payer: Self-pay

## 2013-05-31 ENCOUNTER — Encounter (HOSPITAL_COMMUNITY)
Admission: RE | Disposition: A | Discharge status: Home / Self Care | Payer: Self-pay | Source: Ambulatory Visit | Attending: Cardiology

## 2013-05-31 DIAGNOSIS — Z86711 Personal history of pulmonary embolism: Secondary | ICD-10-CM | POA: Insufficient documentation

## 2013-05-31 DIAGNOSIS — I10 Essential (primary) hypertension: Secondary | ICD-10-CM | POA: Insufficient documentation

## 2013-05-31 DIAGNOSIS — E785 Hyperlipidemia, unspecified: Secondary | ICD-10-CM | POA: Insufficient documentation

## 2013-05-31 DIAGNOSIS — Z86718 Personal history of other venous thrombosis and embolism: Secondary | ICD-10-CM | POA: Insufficient documentation

## 2013-05-31 DIAGNOSIS — I2510 Atherosclerotic heart disease of native coronary artery without angina pectoris: Secondary | ICD-10-CM | POA: Insufficient documentation

## 2013-05-31 DIAGNOSIS — I359 Nonrheumatic aortic valve disorder, unspecified: Secondary | ICD-10-CM

## 2013-05-31 DIAGNOSIS — I350 Nonrheumatic aortic (valve) stenosis: Secondary | ICD-10-CM

## 2013-05-31 DIAGNOSIS — I059 Rheumatic mitral valve disease, unspecified: Secondary | ICD-10-CM | POA: Insufficient documentation

## 2013-05-31 DIAGNOSIS — J9610 Chronic respiratory failure, unspecified whether with hypoxia or hypercapnia: Secondary | ICD-10-CM | POA: Insufficient documentation

## 2013-05-31 DIAGNOSIS — M109 Gout, unspecified: Secondary | ICD-10-CM | POA: Insufficient documentation

## 2013-05-31 DIAGNOSIS — I509 Heart failure, unspecified: Secondary | ICD-10-CM | POA: Insufficient documentation

## 2013-05-31 DIAGNOSIS — F329 Major depressive disorder, single episode, unspecified: Secondary | ICD-10-CM | POA: Insufficient documentation

## 2013-05-31 DIAGNOSIS — I5032 Chronic diastolic (congestive) heart failure: Secondary | ICD-10-CM | POA: Insufficient documentation

## 2013-05-31 DIAGNOSIS — Z9981 Dependence on supplemental oxygen: Secondary | ICD-10-CM | POA: Insufficient documentation

## 2013-05-31 DIAGNOSIS — Z87891 Personal history of nicotine dependence: Secondary | ICD-10-CM | POA: Insufficient documentation

## 2013-05-31 DIAGNOSIS — F3289 Other specified depressive episodes: Secondary | ICD-10-CM | POA: Insufficient documentation

## 2013-05-31 DIAGNOSIS — E039 Hypothyroidism, unspecified: Secondary | ICD-10-CM | POA: Insufficient documentation

## 2013-05-31 HISTORY — PX: TEE WITHOUT CARDIOVERSION: SHX5443

## 2013-05-31 LAB — GLUCOSE, CAPILLARY: Glucose-Capillary: 156 mg/dL — ABNORMAL HIGH (ref 70–99)

## 2013-05-31 SURGERY — ECHOCARDIOGRAM, TRANSESOPHAGEAL
Anesthesia: Moderate Sedation

## 2013-05-31 MED ORDER — FENTANYL CITRATE 0.05 MG/ML IJ SOLN
INTRAMUSCULAR | Status: AC
  Filled 2013-05-31: qty 2

## 2013-05-31 MED ORDER — MIDAZOLAM HCL 5 MG/ML IJ SOLN
INTRAMUSCULAR | Status: AC
  Filled 2013-05-31: qty 1

## 2013-05-31 MED ORDER — MIDAZOLAM HCL 10 MG/2ML IJ SOLN
INTRAMUSCULAR | Status: DC | PRN
  Administered 2013-05-31 (×2): 1 mg via INTRAVENOUS

## 2013-05-31 MED ORDER — SODIUM CHLORIDE 0.9 % IV SOLN
INTRAVENOUS | Status: DC
  Administered 2013-05-31: 10:00:00 via INTRAVENOUS

## 2013-05-31 MED ORDER — FENTANYL CITRATE 0.05 MG/ML IJ SOLN
INTRAMUSCULAR | Status: DC | PRN
  Administered 2013-05-31 (×2): 12.5 ug via INTRAVENOUS

## 2013-05-31 MED ORDER — BUTAMBEN-TETRACAINE-BENZOCAINE 2-2-14 % EX AERO
INHALATION_SPRAY | CUTANEOUS | Status: DC | PRN
  Administered 2013-05-31: 2 via TOPICAL

## 2013-05-31 NOTE — Interval H&P Note (Signed)
History and Physical Interval Note:  05/31/2013 11:30 AM  [name]  has presented today for surgery, with the diagnosis of AORTIC STENOSIS  The various methods of treatment have been discussed with the patient and family. After consideration of risks, benefits and other options for treatment, the patient has consented to  Procedure(s): TRANSESOPHAGEAL ECHOCARDIOGRAM (TEE) (N/A) as a surgical intervention .  The patient's history has been reviewed, patient examined, no change in status, stable for surgery.  I have reviewed the patient's chart and labs.  Questions were answered to the patient's satisfaction.     Dalton [employer]

## 2013-05-31 NOTE — CV Procedure (Signed)
Procedure: TEE  Indication: Assess severity of aortic stenosis.   Sedation: Versed 2 mg IV, Fentanyl 25 mcg IV  Findings: Please see echo section of chart for full report.  Normal LV size with mild LV hypertrophy.  EF 50% with mild septal hypokinesis.  Mildly dilated RV with mildly decreased systolic function.  Mild mitral regurgitation.  No LAA thrombus.  Bubble study was weakly positive, possible small PFO.   The aortic valve was trileaflet and heavily calcified.  Mild aortic insufficiency.  AVA 0.78 cm^2 by telemetry.  Mean gradient 30 mmHg.   Impression: Moderate to severe aortic stenosis. Severe by AVA, moderate by mean gradient.  I am concerned that this could be low gradient severe aortic stenosis.  EF is mildly decreased.  Will have her followup and will consider valve clinic referral.   Loralie Champagne 05/31/2013 12:05 PM

## 2013-05-31 NOTE — Discharge Instructions (Signed)
Transesophageal Echocardiography °Transesophageal echocardiography (TEE) is a picture test of your heart using sound waves. The pictures taken can give very detailed pictures of your heart. This can help your doctor see if there are problems with your heart. TEE can check: °· If your heart has blood clots in it. °· How well your heart valves are working. °· If you have an infection on the inside of your heart. °· Some of the major arteries of your heart. °· If your heart valve is working after a repair. °· Your heart before a procedure that uses a shock to your heart to get the rhythm back to normal. °BEFORE THE PROCEDURE °· Do not eat or drink for 6 hours before the procedure or as told by your doctor. °· Make plans to have someone drive you home after the procedure. Do not drive yourself home. °· An IV tube will be put in your arm. °PROCEDURE °· You will be given a medicine to help you relax (sedative). It will be given through the IV tube. °· A numbing medicine will be sprayed in the back of your throat to help numb it. °· The tip of the probe is placed into the back of your mouth. You will be asked to swallow. This helps to pass the probe into your esophagus. °· Once the tip of the probe is in the right place, your doctor can take pictures of your heart. °· You may feel pressure at the back of your throat. °AFTER THE PROCEDURE °· You will be taken to a recovery area so the sedative can wear off. °· Your throat may be sore and scratchy. This will go away slowly over time. °· You will go home when you are fully awake and able to swallow liquids. °· You should have someone stay with you for the next 24 hours. °Document Released: 12/09/2008 Document Revised: 12/02/2012 Document Reviewed: 08/13/2012 °[employer]® Patient Information ©2014 [employer]. ° °

## 2013-05-31 NOTE — H&P (View-Only) (Signed)
Patient ID: [name], female   DOB: 30-Nov-1932, 80 y.o.   MRN: [id number] PCP: Dr. Ellwood Dense Referring MD: Dr. Radford Pax  80 yo with history of chronic diastolic CHF and probable severe AS, chronic respiratory failure on home oxygen likely due to OHS/OSA, and chronic atrial fibrillation presents for CHF clinic evaluation.  She has had a number of hospitalizations over the last year involving volume overload.  She lives with her son and has a nursing aide that stays with her during the day. She is not very active.  She is able to walk in her house with her walker but is short of breath walking from the living room to the bathroom. She has had significant dyspnea and has been on oxygen now for several years.  She was never a heavy smoker so suspect OHS plays a role here.  No chest pain.  +Orthopnea, she sleeps propped on several pillows.  No PND.  +Bendopnea.  Recent echo showed normal LV EF, diastolic dysfunction, and probably severe aortic stenosis. She is on warfarin for chronic atrial fibrillation.   Labs (3/15): K 4, creatinine 1.1, BNP 423  PMH: 1. Gastric dysmotility 2. Chronic diastolic CHF: Multiple exacerbations.  Echo (4/15) with EF 55-60%, moderate LVH with restrictive diastolic function, moderate to severe AS with mean gradient 38 mmHg and AVA 0.77 cm^2, mild MR, PA systolic pressure 48 mmHg.   3. Aortic stenosis: See above, probably severe. 4. Graves disease s/p RAI, now hypothyroid. 5. Gout 6. Hyperlipidemia 7. Recurrent UTIs 8. Depression 9. HTN  10. Morbidly obese s/p gastric plication. 11. GERD 12. Ventral hernia 13. DVT with PE in 1998 14. CVA 1997 15. Chronic atrial fibrillation 16. Suspect OHS/OSA: Chronic respiratory failure on home oxygen.  81. CAD: LHC in 2006 with mild CAD.  SH: Prior smoker, quit 45 yrs ago after only a few years.  Lives with son but has caretaker during day while he is at work.  FH: Parents with MIs, not sure what age.  ROS: All systems  reviewed and negative except as per HPI.   Current Outpatient Prescriptions  Medication Sig Dispense Refill  . acetaminophen (TYLENOL) 500 MG tablet Take 500-1,000 mg by mouth 2 (two) times daily as needed (pain).       Marland Kitchen albuterol (PROVENTIL HFA;VENTOLIN HFA) 108 (90 BASE) MCG/ACT inhaler Inhale 2 puffs into the lungs every 6 (six) hours as needed for wheezing or shortness of breath.  1 Inhaler  2  . allopurinol (ZYLOPRIM) 100 MG tablet TAKE ONE TABLET BY MOUTH TWICE DAILY  60 tablet  0  . atorvastatin (LIPITOR) 40 MG tablet Take 40 mg by mouth daily.      . colchicine 0.6 MG tablet Take 0.6 mg by mouth See admin instructions. Take for 3 days when gout flares up      . diphenhydrAMINE (BENADRYL) 25 MG tablet Take 50 mg by mouth 3 (three) times daily.      Marland Kitchen FLUoxetine (PROZAC) 20 MG capsule TAKE ONE CAPSULE BY MOUTH TWICE DAILY  180 capsule  0  . Fluticasone-Salmeterol (ADVAIR) 250-50 MCG/DOSE AEPB Inhale 1 puff into the lungs 2 (two) times daily.      Marland Kitchen gabapentin (NEURONTIN) 100 MG capsule Take 2 capsules (200 mg total) by mouth 2 (two) times daily.  120 capsule  11  . glipiZIDE (GLUCOTROL) 10 MG tablet TAKE ONE TABLET BY MOUTH TWICE DAILY BEFORE MEALS  60 tablet  5  . glucose blood (ONE TOUCH ULTRA TEST) test  strip Check blood sugar 3x a day dx code 250.00  100 each  12  . levothyroxine (SYNTHROID, LEVOTHROID) 150 MCG tablet TAKE ONE TABLET BY MOUTH EVERY DAY  90 tablet  3  . loperamide (IMODIUM) 2 MG capsule Take 1 capsule (2 mg total) by mouth as needed for diarrhea or loose stools (up to total 16mg daily).  8 capsule  0  . omeprazole (PRILOSEC) 20 MG capsule TAKE TWO CAPSULES BY MOUTH ONCE DAILY  60 capsule  0  . ONETOUCH DELICA LANCETS FINE MISC 1 each by Does not apply route 3 (three) times daily. Dx code 250.00  100 each  12  . oxymetazoline (AFRIN NASAL SPRAY) 0.05 % nasal spray Place 1 spray into both nostrils as needed. Use only if you have a nose bleed.  30 mL  0  . polyethylene  glycol (MIRALAX / GLYCOLAX) packet Take 17 g by mouth every morning.      . tiotropium (SPIRIVA) 18 MCG inhalation capsule Place 18 mcg into inhaler and inhale daily.      . warfarin (COUMADIN) 5 MG tablet TAKE ONE TABLET BY MOUTH ONCE DAILY AT 6 PM. EXCEPT ON MONDAYS TAKE 1.5 TABLET( 7.5 MG)  32 tablet  4  . potassium chloride SA (K-DUR,KLOR-CON) 20 MEQ tablet Take 1 tablet (20 mEq total) by mouth daily.  30 tablet  3  . torsemide (DEMADEX) 20 MG tablet Take 4 tablets (80 mg total) by mouth 2 (two) times daily.  320 tablet  3   No current facility-administered medications for this encounter.    BP 114/62  Pulse 56  Wt 257 lb (116.574 kg)  SpO2 91%  LMP 05/22/1968 General: NAD, obese, has on oxygen.  Neck: Thick, JVP difficult but probably mildly elevated 8-9 cm, no thyromegaly or thyroid nodule.  Lungs: Clear to auscultation bilaterally with normal respiratory effort. CV: Nondisplaced PMI.  Heart irregular S1/S2, no S3/S4, 3/6 crescendo-descrescendo murmur RUSB with radiation to neck.  S2 is considerably decreased.  No peripheral edema.  Difficult to palpate pedal pulses..  Abdomen: Soft, nontender, no hepatosplenomegaly, no distention.  Skin: Intact without lesions or rashes.  Neurologic: Alert and oriented x 3.  Psych: Normal affect. Extremities: No clubbing or cyanosis.  HEENT: Normal.   Assessment/plan: 1. Chronic diastolic CHF: Patient has had difficult to manage diastolic CHF.  She is likely at least somewhat volume overloaded currently though exam is difficult.  She had restrictive diastolic function on last echo.  I am concerned that she may have severe aortic stenosis which may be potentiating her CHF symptoms.   - Stop Lasix and start torsemide 80 mg daily with BMET/BNP in 2 wks as well as followup appointment. Continue KCl 20 daily.  2. Aortic stenosis: Based on exam and her last echo, I am concerned that her AS may be severe.  Echo showed AVA 0.77 cm^2 with mean gradient 38  mmHg.  S2 is considerably decreased on exam.  If she does have severe AS, she would be a poor surgical AVR candidate given her multiple comorbidities.  However, TAVR may be an option.  - I am going to arrange for TEE to more fully assess AS.  If severe, will arrange for evaluation in valve clinic.  3. Atrial fibrillation: Chronic.  Reasonable rate control without nodal blockade.  Continue coumadin.  4. Chronic respiratory failure: Suspect OHS with some role from diastolic CHF.  She has been on home oxygen.  I do not see where she has   been seeing pulmonary.  Would likely be reasonable to get a pulmonary evaluation.  Doubt COPD as she has a minimal smoking history.  5. CAD: Minimal on prior cath.  As she is on coumadin for atrial fibrillation, she can stop ASA 81.   Dalton McLean 05/28/2013   

## 2013-05-31 NOTE — Op Note (Signed)
Additional u/s images obtain externally by Echo Tech per Dr. Claris Gladden request..Marland KitchenMarland Kitchen

## 2013-05-31 NOTE — Progress Notes (Signed)
*  PRELIMINARY RESULTS* Echocardiogram Echocardiogram Transesophageal has been performed.  Leavy Cella 05/31/2013, 12:25 PM

## 2013-06-01 ENCOUNTER — Ambulatory Visit (INDEPENDENT_AMBULATORY_CARE_PROVIDER_SITE_OTHER): Payer: Medicare PPO | Admitting: Internal Medicine

## 2013-06-01 ENCOUNTER — Encounter: Payer: Self-pay | Admitting: Internal Medicine

## 2013-06-01 ENCOUNTER — Encounter (HOSPITAL_COMMUNITY): Payer: Self-pay | Admitting: Cardiology

## 2013-06-01 VITALS — BP 122/63 | HR 59 | Temp 97.5°F | Wt 252.5 lb

## 2013-06-01 DIAGNOSIS — I5032 Chronic diastolic (congestive) heart failure: Secondary | ICD-10-CM

## 2013-06-01 DIAGNOSIS — E119 Type 2 diabetes mellitus without complications: Secondary | ICD-10-CM

## 2013-06-01 DIAGNOSIS — I350 Nonrheumatic aortic (valve) stenosis: Secondary | ICD-10-CM

## 2013-06-01 DIAGNOSIS — E1142 Type 2 diabetes mellitus with diabetic polyneuropathy: Secondary | ICD-10-CM

## 2013-06-01 DIAGNOSIS — J449 Chronic obstructive pulmonary disease, unspecified: Secondary | ICD-10-CM

## 2013-06-01 DIAGNOSIS — I509 Heart failure, unspecified: Secondary | ICD-10-CM

## 2013-06-01 DIAGNOSIS — M109 Gout, unspecified: Secondary | ICD-10-CM

## 2013-06-01 DIAGNOSIS — F29 Unspecified psychosis not due to a substance or known physiological condition: Secondary | ICD-10-CM

## 2013-06-01 DIAGNOSIS — R410 Disorientation, unspecified: Secondary | ICD-10-CM

## 2013-06-01 DIAGNOSIS — E785 Hyperlipidemia, unspecified: Secondary | ICD-10-CM

## 2013-06-01 DIAGNOSIS — I10 Essential (primary) hypertension: Secondary | ICD-10-CM

## 2013-06-01 DIAGNOSIS — I2510 Atherosclerotic heart disease of native coronary artery without angina pectoris: Secondary | ICD-10-CM

## 2013-06-01 DIAGNOSIS — I359 Nonrheumatic aortic valve disorder, unspecified: Secondary | ICD-10-CM

## 2013-06-01 DIAGNOSIS — E039 Hypothyroidism, unspecified: Secondary | ICD-10-CM

## 2013-06-01 NOTE — Progress Notes (Signed)
Subjective:     Patient ID: [name], female   DOB: 1932-04-02, 80 y.o.   MRN: [id number]  HPI [name] comes back for follow up on her hypertension, diabetes, and gout. Her home health aide is with her.  She has chronic diastolic CHF and moderate to severe AS among her many significant medical problems which is managed by cardiology (Dr Trace Radford Pax and Dr Clifton James). There was a recent TEE done which was discussed by me with her. She seemed to know the results however translated them as "a part of my heart is frozen". I tried to go over the results with her and explained them to her to the best of my ability. There was also a confusion regarding TAVR- and she thought that she was going in for the surgery on 06/09/13. I can see only a post TEE visit scheduled on that day, and I tried to tell her that.   Medication management is always an issue with [name] and every visit we go over her medications to make sure she is on the right regimen. Her son manages her medications for her, though she also has a house aide who takes care of her (however the house aide says that she does not know anything about her medications!). She seems to be taking all her medications as directed except there is doubt about allopurinol - which neither herself nor her aide seems to remember.   [name] has recently been in her usual state of health. Her last hospital discharge was 04/03/13 when she was admitted for acute on chronic CHF. She denies any new complaints today.   Review of Systems  Constitutional: Negative for chills, activity change (Wheelchair bound, mostly inactive. ), fatigue and unexpected weight change (Decreased 8 lbs from 05/12/13 visit).  Respiratory: Positive for shortness of breath (Baseline SOB). Negative for cough, choking, chest tightness and wheezing.   Cardiovascular: Positive for leg swelling (Baseline). Negative for chest pain.  Gastrointestinal: Negative for diarrhea,  constipation and abdominal distention.  Genitourinary: Negative.   Musculoskeletal: Positive for arthralgias (chronic). Negative for joint swelling.  Neurological: Negative for dizziness and syncope.  Psychiatric/Behavioral: Negative for confusion.       Objective:   Physical Exam  Constitutional: She is oriented to person, place, and time. No distress.  Morbid obese  HENT:  Head: Normocephalic and atraumatic.  Eyes: Conjunctivae are normal. Pupils are equal, round, and reactive to light.  Cardiovascular: Normal rate.  Exam reveals no gallop.   Murmur heard. Pulmonary/Chest: Effort normal and breath sounds normal. No respiratory distress. She has no wheezes. She has no rales. She exhibits no tenderness.  Neurological: She is alert and oriented to person, place, and time.  Skin: Skin is warm.  Psychiatric: She has a normal mood and affect. Her behavior is normal. Thought content normal.       Assessment & Plan:     Please see problem based charting.

## 2013-06-01 NOTE — Assessment & Plan Note (Signed)
Assessment: BP Readings from Last 3 Encounters:  06/01/13 122/63  05/31/13 145/74  05/31/13 145/74   Well controlled.   Plan:  Continue current regimen.

## 2013-06-01 NOTE — Assessment & Plan Note (Signed)
Assessment: Lab Results  Component Value Date   CHOL 99 08/20/2012   HDL 45 08/20/2012   LDLCALC 36 08/20/2012   TRIG 92 08/20/2012   CHOLHDL 2.2 08/20/2012   Well controlled on atorvastatin 40.   Plan:  Continue current dose.   Plan to check lipid profile next visit.

## 2013-06-01 NOTE — Assessment & Plan Note (Signed)
Assessment: Patient compliant with all medications and inhalers. Appears to be at baseline control.   Plan:  Continue the current regimen.

## 2013-06-01 NOTE — Assessment & Plan Note (Signed)
Assessment: Moderate to severe AS. Most recent TEE discussed with patient. Doubts about TAVR discussed.   Plan:  Encouraged patient to sit with her cardiologist and ask her concerns and fears about her heart problem and TAVR option.

## 2013-06-01 NOTE — Assessment & Plan Note (Signed)
Assessment: Results for MERI, PELOT (MRN [id number]) as of 06/01/2013 13:31  04/20/2012 21:12 05/23/2012 10:08 06/26/2012 16:23  TSH 1.277 5.588 (H) 4.144   Well controlled. Patient compliant.  Plan:  Continue current dosage of synthroid.

## 2013-06-01 NOTE — Assessment & Plan Note (Signed)
Assessment: Per last note, the patient was confused per son's report The patient seemed normal to me this visit.  Plan:  No dysuria, no other signs and symptoms of infection - continue to monitor.   Educated the health care nurse about stroke signs and symptoms, and asked her to call for help if she thought that the patient was confused again.

## 2013-06-01 NOTE — Assessment & Plan Note (Signed)
Assessment: Flares not well controlled, even though her uric acid has come down to 7 this year from >9 last year when I started allopurinol. She has multiple risk factors for gout, and it might be a challenge to control her flares. She is obese, diabetic, does not do adequate diet control, has to take high dose diuretics for CHF, and her understanding of her diseases and medications is very poor. I am not sure if she is taking allopurinol, and she takes colchicine PRN.   Plan:  Advised patient to take Allopurinol as directed.   Would consider referral to a rheumatologist in the future for better control.

## 2013-06-01 NOTE — Assessment & Plan Note (Signed)
Assessment: In remission since last hospital admission, being followed by Cardiology. Recently switched to Torsemide from lasix and started K-Con. Reports compliance. Weight down by 8 lbs since 05/12/13.   Plan:  Would monitor along with cardiology.   Would not change regimen.   Plan to do BMP next visit, if not done first by cardiology.

## 2013-06-01 NOTE — Assessment & Plan Note (Signed)
Assessment: Well controlled for her goals.   Lab Results  Component Value Date   HGBA1C 7.1 04/29/2013   HGBA1C 7.3 02/16/2013   HGBA1C 6.7 11/10/2012   Plan:  We will continue current regimen.

## 2013-06-01 NOTE — Patient Instructions (Signed)
[name],   It was a pleasure to meet you today.  Your blood pressure and diabetes are doing well.  We discussed your TEE - the heart picture procedure that you had. You have a follow up appointment on 06/09/13 with your heart doctor.  We discussed your medications - it seems that you are not taking your gout medicine Allopurinol as directed. Please take it regularly to avoid gout attacks. I am giving you more information dietary items to avoid to prevent gouty flares. We will not change any medications today.    See you in 3 months.   Thanks, Madilyn Fireman MD MPH 06/01/2013 11:01 AM    Gout Gout is when your joints become red, sore, and swell (inflammed). This is caused by the buildup of uric acid crystals in the joints. Uric acid is a chemical that is normally in the blood. If the level of uric acid gets too high in the blood, these crystals form in your joints and tissues. Over time, these crystals can form into masses near the joints and tissues. These masses can destroy bone and cause the bone to look misshapen (deformed). HOME CARE   Do not take aspirin for pain.  Only take medicine as told by your doctor.  Rest the joint as much as you can. When in bed, keep sheets and blankets off painful areas.  Keep the sore joints raised (elevated).  Put warm or cold packs on painful joints. Use of warm or cold packs depends on which works best for you.  Use crutches if the painful joint is in your leg.  Drink enough fluids to keep your pee (urine) clear or pale yellow. Limit alcohol, sugary drinks, and drinks with fructose in them.  Follow your diet instructions. Pay careful attention to how much protein you eat. Include fruits, vegetables, whole grains, and fat-free or low-fat milk products in your daily diet. Talk to your doctor or dietician about the use of coffee, vitamin C, and cherries. These may help lower uric acid levels.  Keep a healthy body weight. GET HELP RIGHT AWAY  IF:   You have watery poop (diarrhea), throw up (vomit), or have any side effects from medicines.  You do not feel better in 24 hours, or you are getting worse.  Your joint becomes suddenly more tender, and you have chills or a fever.

## 2013-06-02 ENCOUNTER — Telehealth: Payer: Self-pay | Admitting: Internal Medicine

## 2013-06-04 ENCOUNTER — Inpatient Hospital Stay: Admission: RE | Admit: 2013-06-04 | Payer: Medicare PPO | Source: Ambulatory Visit

## 2013-06-08 ENCOUNTER — Telehealth: Payer: Self-pay

## 2013-06-08 NOTE — Telephone Encounter (Signed)
Patient past away @ Home per [facility]bit[facility]  °

## 2013-06-09 ENCOUNTER — Encounter (HOSPITAL_COMMUNITY): Payer: Medicare PPO

## 2013-06-25 NOTE — Telephone Encounter (Signed)
Received a call from [facility], [phone]) stating that [name] was found deceased at home. Discuss with Dr. Ellwood Dense this morning.  Dominic Pea, DO, [facility] 2013-06-21, 10:55 AM

## 2013-06-25 DEATH — deceased

## 2013-08-11 ENCOUNTER — Ambulatory Visit: Payer: Medicare PPO | Admitting: Cardiology

## 2013-08-31 ENCOUNTER — Ambulatory Visit: Payer: Medicare PPO

## 2014-12-05 IMAGING — CR DG CHEST 1V PORT
1 series · 1 of 1 positions shown · non-contrast
Comparison: 05/24/2012

CLINICAL DATA: Chest pain and shortness of breath tonight.

PORTABLE CHEST - 1 VIEW

[AP]
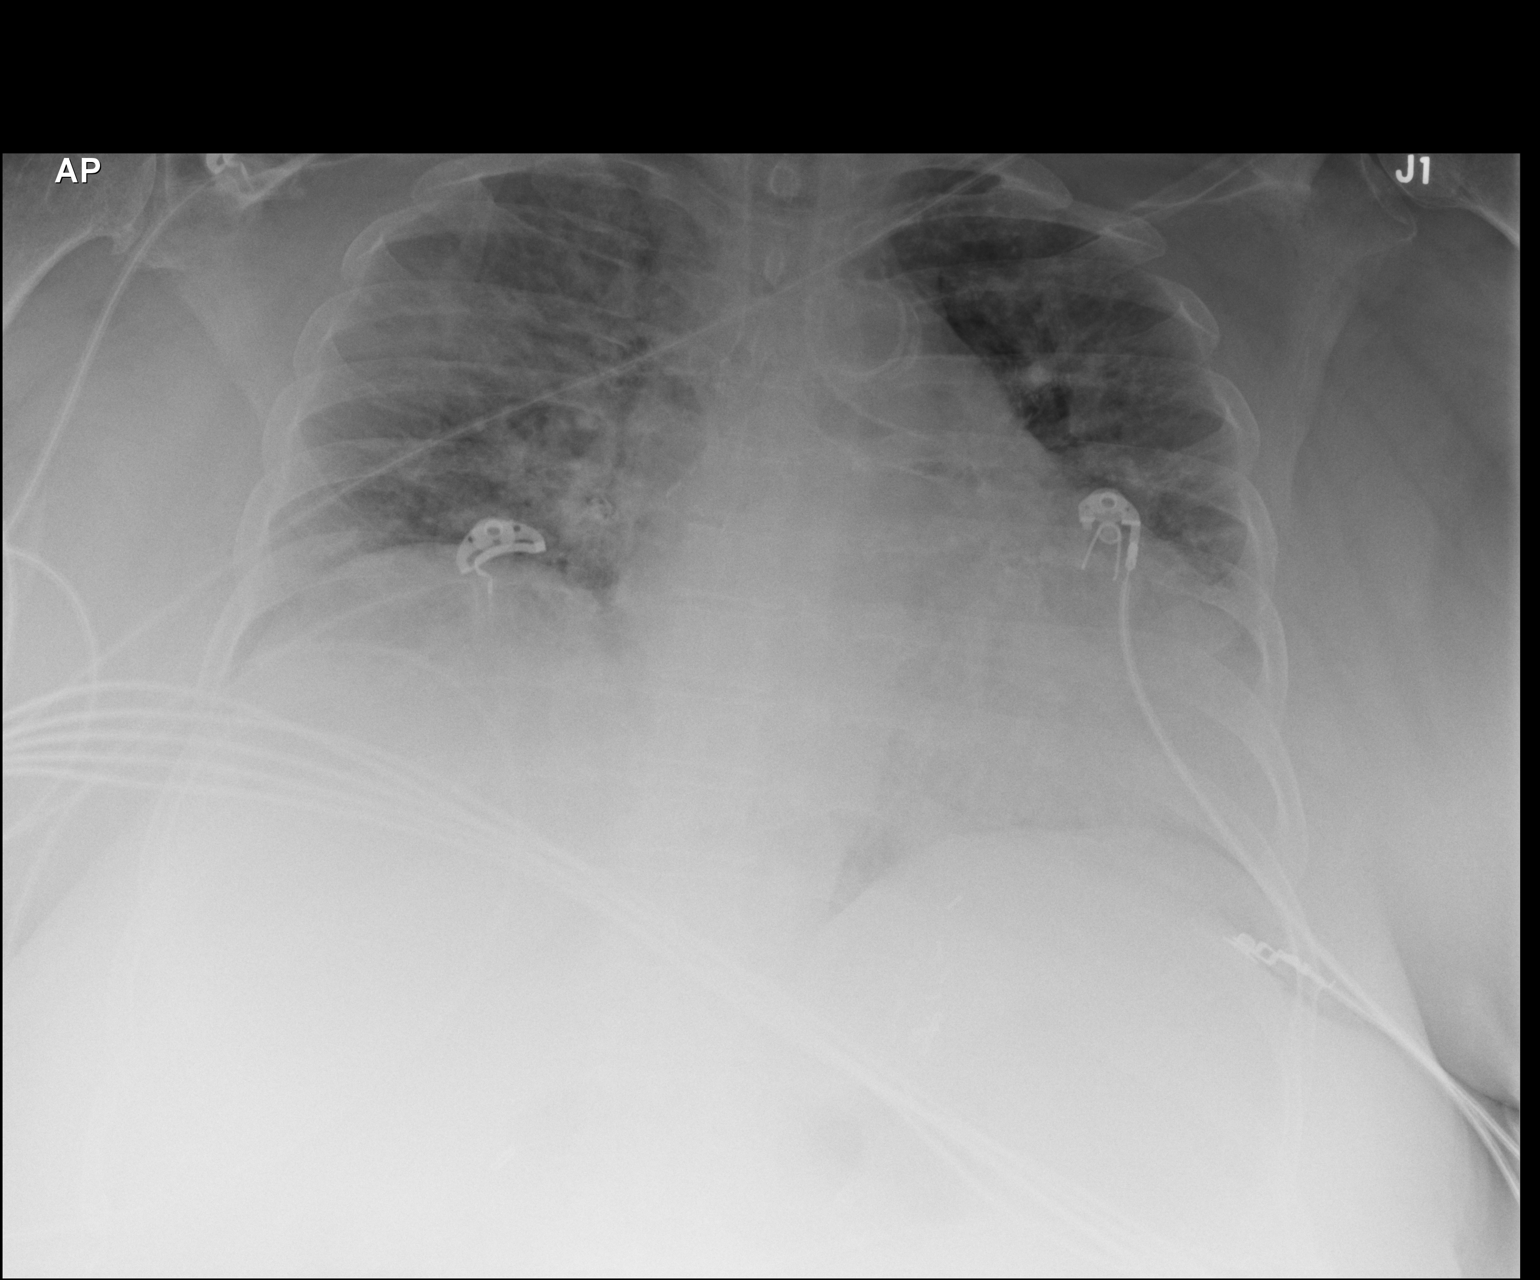

[1 of 1 positions shown; findings below may reference images not displayed]

FINDINGS: Shallow inspiration.  Cardiac enlargement with pulmonary
vascular congestion and perihilar edema.  Findings demonstrate some
progression since previous study.  No blunting of costophrenic
angles.  No pneumothorax.  Mediastinal contours appear intact.
Calcified and tortuous aorta.  Degenerative changes in the spine
and shoulders.
IMPRESSION: Cardiac enlargement with pulmonary vascular congestion and
perihilar edema, demonstrating progression since previous study.

## 2014-12-10 IMAGING — CR DG CHEST 1V PORT
1 series · 1 of 1 positions shown · non-contrast
Comparison: Chest x-ray 08/06/2012.

CLINICAL DATA: Shortness of breath.

PORTABLE CHEST - 1 VIEW

[AP]
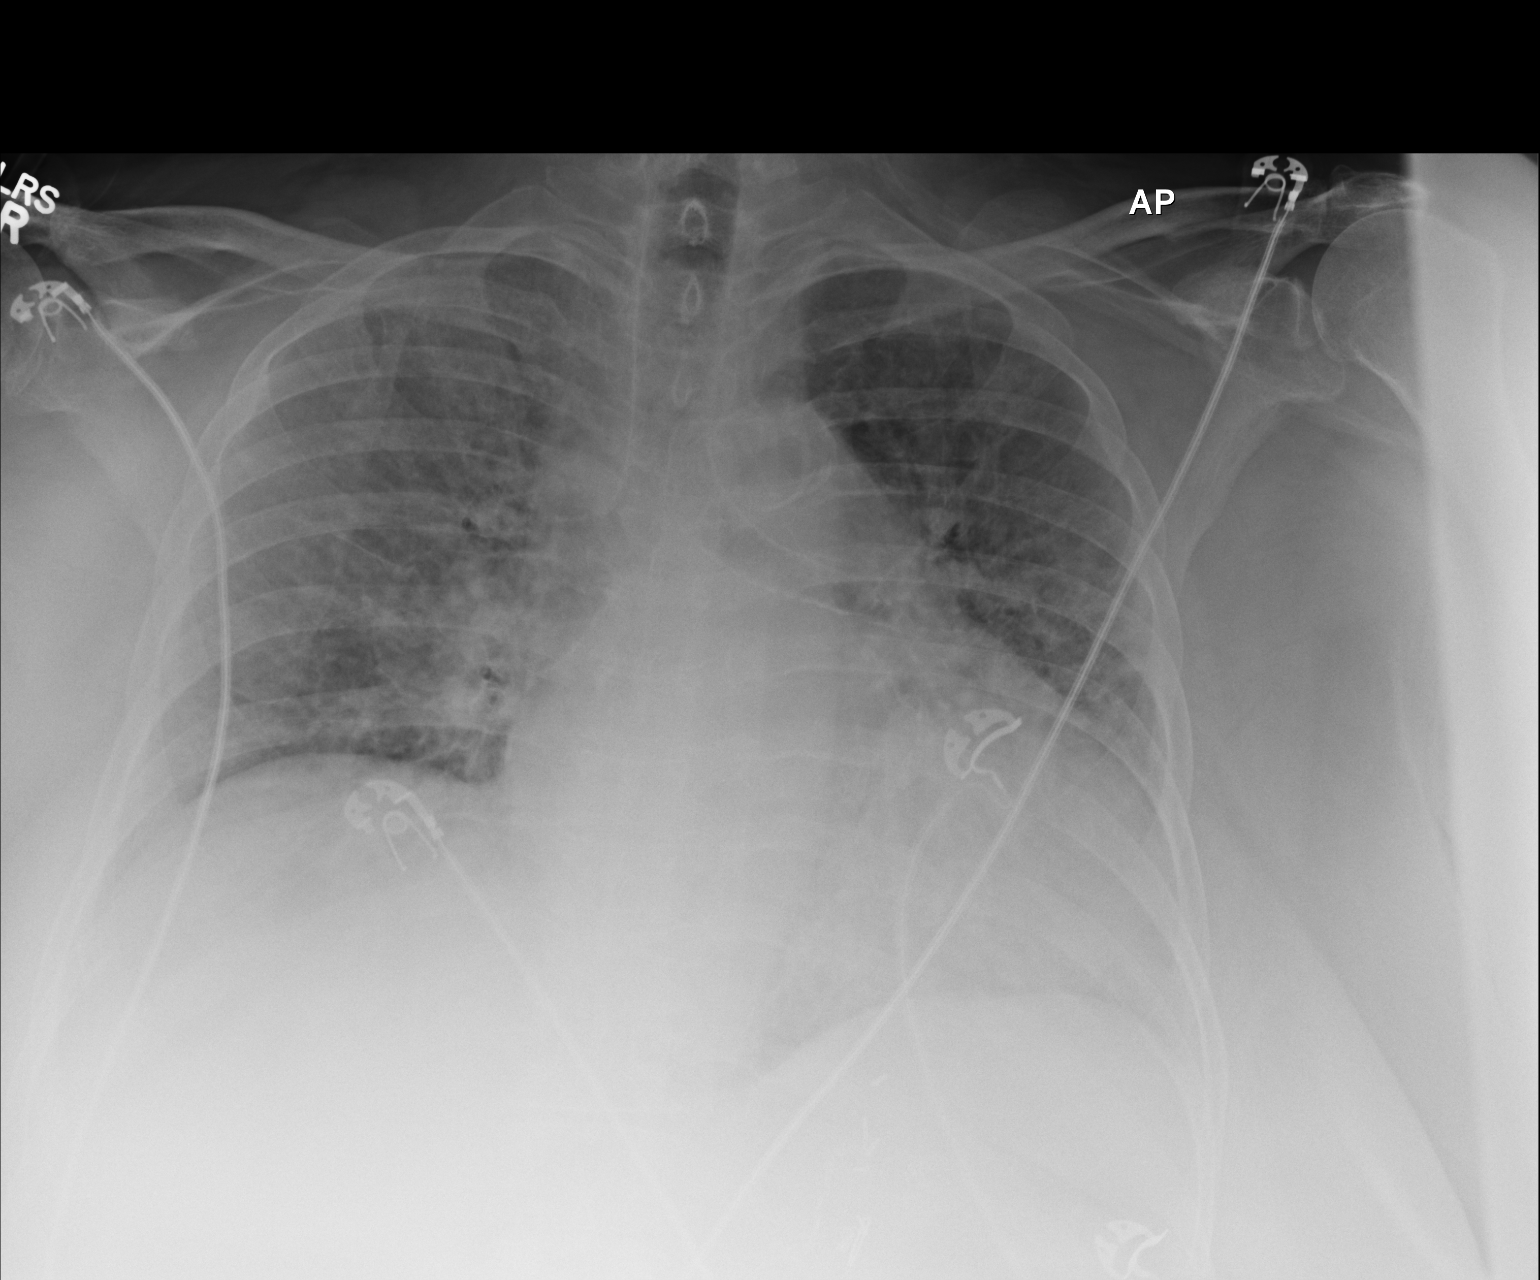

[1 of 1 positions shown; findings below may reference images not displayed]

FINDINGS: Lung volumes are low.  Elevation of the right
hemidiaphragm (unchanged). There is cephalization of the pulmonary
vasculature, indistinctness of the interstitial markings, and
patchy airspace disease throughout the lungs bilaterally suggestive
of moderate pulmonary edema.  No definite pleural effusions.  Mild
cardiomegaly is unchanged.  Upper mediastinal contours are within
normal limits.  Atherosclerosis in the thoracic aorta.
IMPRESSION: 1.  The appearance of the chest, as above is suggestive of
congestive heart failure.
2.  Atherosclerosis.

## 2015-10-09 NOTE — Progress Notes (Signed)
Patient deceased. Was required to put a RTC visit date in as a means of closing chart.
